# Patient Record
Sex: Female | Born: 1935 | Race: White | Hispanic: No | Marital: Married | Smoking: Never smoker
Health system: Southern US, Community
[De-identification: ages and names within clinical notes are randomized; demographics above are authoritative.]

## PROBLEM LIST (undated history)

## (undated) DIAGNOSIS — I350 Nonrheumatic aortic (valve) stenosis: Secondary | ICD-10-CM

## (undated) DIAGNOSIS — R0602 Shortness of breath: Secondary | ICD-10-CM

## (undated) DIAGNOSIS — I10 Essential (primary) hypertension: Secondary | ICD-10-CM

## (undated) DIAGNOSIS — R5381 Other malaise: Secondary | ICD-10-CM

## (undated) DIAGNOSIS — R5383 Other fatigue: Secondary | ICD-10-CM

## (undated) DIAGNOSIS — Z86010 Personal history of colonic polyps: Secondary | ICD-10-CM

## (undated) DIAGNOSIS — R7301 Impaired fasting glucose: Secondary | ICD-10-CM

## (undated) DIAGNOSIS — K635 Polyp of colon: Secondary | ICD-10-CM

## (undated) DIAGNOSIS — I2510 Atherosclerotic heart disease of native coronary artery without angina pectoris: Secondary | ICD-10-CM

## (undated) DIAGNOSIS — K219 Gastro-esophageal reflux disease without esophagitis: Secondary | ICD-10-CM

## (undated) DIAGNOSIS — R011 Cardiac murmur, unspecified: Secondary | ICD-10-CM

## (undated) DIAGNOSIS — M159 Polyosteoarthritis, unspecified: Secondary | ICD-10-CM

## (undated) DIAGNOSIS — E669 Obesity, unspecified: Secondary | ICD-10-CM

## (undated) DIAGNOSIS — E785 Hyperlipidemia, unspecified: Secondary | ICD-10-CM

## (undated) DIAGNOSIS — R112 Nausea with vomiting, unspecified: Secondary | ICD-10-CM

## (undated) DIAGNOSIS — R079 Chest pain, unspecified: Secondary | ICD-10-CM

## (undated) DIAGNOSIS — I359 Nonrheumatic aortic valve disorder, unspecified: Secondary | ICD-10-CM

## (undated) DIAGNOSIS — R0600 Dyspnea, unspecified: Secondary | ICD-10-CM

## (undated) DIAGNOSIS — Z8719 Personal history of other diseases of the digestive system: Secondary | ICD-10-CM

## (undated) DIAGNOSIS — Z9889 Other specified postprocedural states: Secondary | ICD-10-CM

## (undated) DIAGNOSIS — Z952 Presence of prosthetic heart valve: Secondary | ICD-10-CM

## (undated) HISTORY — DX: Obesity, unspecified: E66.9

## (undated) HISTORY — DX: Impaired fasting glucose: R73.01

## (undated) HISTORY — PX: KNEE SURGERY: SHX244

## (undated) HISTORY — PX: TONSILLECTOMY: SUR1361

## (undated) HISTORY — DX: Cardiac murmur, unspecified: R01.1

## (undated) HISTORY — DX: Shortness of breath: R06.02

## (undated) HISTORY — DX: Polyp of colon: K63.5

## (undated) HISTORY — DX: Dyspnea, unspecified: R06.00

## (undated) HISTORY — DX: Personal history of other diseases of the digestive system: Z87.19

## (undated) HISTORY — DX: Other specified postprocedural states: Z98.890

## (undated) HISTORY — DX: Polyosteoarthritis, unspecified: M15.9

## (undated) HISTORY — DX: Personal history of colonic polyps: Z86.010

## (undated) HISTORY — DX: Hyperlipidemia, unspecified: E78.5

## (undated) HISTORY — DX: Atherosclerotic heart disease of native coronary artery without angina pectoris: I25.10

## (undated) HISTORY — DX: Nonrheumatic aortic (valve) stenosis: I35.0

## (undated) HISTORY — DX: Nonrheumatic aortic valve disorder, unspecified: I35.9

## (undated) HISTORY — PX: FOOT SURGERY: SHX648

## (undated) HISTORY — PX: CHOLECYSTECTOMY: SHX55

## (undated) HISTORY — DX: Essential (primary) hypertension: I10

## (undated) HISTORY — DX: Other malaise: R53.81

## (undated) HISTORY — DX: Gastro-esophageal reflux disease without esophagitis: K21.9

## (undated) HISTORY — DX: Chest pain, unspecified: R07.9

## (undated) HISTORY — PX: APPENDECTOMY: SHX54

## (undated) HISTORY — DX: Other fatigue: R53.83

---

## 1997-06-28 ENCOUNTER — Encounter: Admission: RE | Admit: 1997-06-28 | Discharge: 1997-09-26 | Payer: Self-pay | Admitting: Internal Medicine

## 1997-11-21 ENCOUNTER — Other Ambulatory Visit: Admission: RE | Admit: 1997-11-21 | Discharge: 1997-11-21 | Payer: Self-pay | Admitting: Radiology

## 2000-03-17 ENCOUNTER — Other Ambulatory Visit: Admission: RE | Admit: 2000-03-17 | Discharge: 2000-03-17 | Payer: Self-pay | Admitting: Internal Medicine

## 2000-11-08 ENCOUNTER — Ambulatory Visit (HOSPITAL_COMMUNITY): Admission: RE | Admit: 2000-11-08 | Discharge: 2000-11-08 | Payer: Self-pay | Admitting: Gastroenterology

## 2000-11-08 ENCOUNTER — Encounter: Payer: Self-pay | Admitting: Gastroenterology

## 2000-11-28 ENCOUNTER — Encounter (INDEPENDENT_AMBULATORY_CARE_PROVIDER_SITE_OTHER): Payer: Self-pay | Admitting: Specialist

## 2000-11-28 ENCOUNTER — Encounter: Payer: Self-pay | Admitting: Gastroenterology

## 2000-11-28 ENCOUNTER — Ambulatory Visit (HOSPITAL_COMMUNITY): Admission: RE | Admit: 2000-11-28 | Discharge: 2000-11-28 | Payer: Self-pay | Admitting: Gastroenterology

## 2001-04-03 ENCOUNTER — Other Ambulatory Visit: Admission: RE | Admit: 2001-04-03 | Discharge: 2001-04-03 | Payer: Self-pay | Admitting: Internal Medicine

## 2003-03-28 ENCOUNTER — Encounter: Payer: Self-pay | Admitting: Internal Medicine

## 2003-04-29 ENCOUNTER — Encounter: Payer: Self-pay | Admitting: Internal Medicine

## 2003-11-19 ENCOUNTER — Ambulatory Visit: Payer: Self-pay | Admitting: Internal Medicine

## 2003-11-19 ENCOUNTER — Other Ambulatory Visit: Admission: RE | Admit: 2003-11-19 | Discharge: 2003-11-19 | Payer: Self-pay | Admitting: Internal Medicine

## 2003-11-26 ENCOUNTER — Ambulatory Visit: Payer: Self-pay | Admitting: Internal Medicine

## 2004-11-24 ENCOUNTER — Ambulatory Visit: Payer: Self-pay | Admitting: Internal Medicine

## 2004-12-07 ENCOUNTER — Ambulatory Visit: Payer: Self-pay | Admitting: Internal Medicine

## 2005-01-26 ENCOUNTER — Ambulatory Visit: Payer: Self-pay | Admitting: Internal Medicine

## 2005-02-02 ENCOUNTER — Ambulatory Visit: Payer: Self-pay | Admitting: Internal Medicine

## 2005-03-17 ENCOUNTER — Ambulatory Visit: Payer: Self-pay | Admitting: Internal Medicine

## 2005-03-23 ENCOUNTER — Ambulatory Visit: Payer: Self-pay | Admitting: Internal Medicine

## 2005-09-13 ENCOUNTER — Ambulatory Visit: Payer: Self-pay | Admitting: Internal Medicine

## 2005-09-24 ENCOUNTER — Ambulatory Visit: Payer: Self-pay | Admitting: Internal Medicine

## 2005-12-01 ENCOUNTER — Ambulatory Visit: Payer: Self-pay | Admitting: Internal Medicine

## 2006-01-17 ENCOUNTER — Ambulatory Visit: Payer: Self-pay | Admitting: Internal Medicine

## 2006-01-17 ENCOUNTER — Encounter: Payer: Self-pay | Admitting: Vascular Surgery

## 2006-01-17 ENCOUNTER — Ambulatory Visit (HOSPITAL_COMMUNITY): Admission: RE | Admit: 2006-01-17 | Discharge: 2006-01-17 | Payer: Self-pay | Admitting: Internal Medicine

## 2006-01-26 ENCOUNTER — Encounter: Admission: RE | Admit: 2006-01-26 | Discharge: 2006-01-26 | Payer: Self-pay | Admitting: Orthopaedic Surgery

## 2006-04-25 ENCOUNTER — Ambulatory Visit: Payer: Self-pay | Admitting: Internal Medicine

## 2006-04-26 ENCOUNTER — Encounter: Payer: Self-pay | Admitting: Internal Medicine

## 2006-04-26 LAB — CONVERTED CEMR LAB
Basophils Relative: 0 % (ref 0.0–1.0)
Bilirubin, Direct: 0.1 mg/dL (ref 0.0–0.3)
CO2: 27 meq/L (ref 19–32)
Creatinine, Ser: 0.8 mg/dL (ref 0.4–1.2)
Eosinophils Relative: 0.1 % (ref 0.0–5.0)
GFR calc Af Amer: 92 mL/min
Glucose, Bld: 125 mg/dL — ABNORMAL HIGH (ref 70–99)
HCT: 38.3 % (ref 36.0–46.0)
Hemoglobin: 13.3 g/dL (ref 12.0–15.0)
Lymphocytes Relative: 15 % (ref 12.0–46.0)
Monocytes Absolute: 0.4 10*3/uL (ref 0.2–0.7)
Neutro Abs: 7.1 10*3/uL (ref 1.4–7.7)
Neutrophils Relative %: 80.4 % — ABNORMAL HIGH (ref 43.0–77.0)
Potassium: 3.5 meq/L (ref 3.5–5.1)
Total Bilirubin: 0.6 mg/dL (ref 0.3–1.2)
Total Protein: 6.2 g/dL (ref 6.0–8.3)
WBC: 8.8 10*3/uL (ref 4.5–10.5)

## 2006-05-09 ENCOUNTER — Ambulatory Visit: Payer: Self-pay | Admitting: Internal Medicine

## 2006-05-11 ENCOUNTER — Ambulatory Visit: Payer: Self-pay | Admitting: Internal Medicine

## 2006-05-30 ENCOUNTER — Ambulatory Visit: Payer: Self-pay | Admitting: Internal Medicine

## 2006-07-21 ENCOUNTER — Encounter: Payer: Self-pay | Admitting: Internal Medicine

## 2006-07-21 DIAGNOSIS — Z8719 Personal history of other diseases of the digestive system: Secondary | ICD-10-CM | POA: Insufficient documentation

## 2006-07-21 DIAGNOSIS — M545 Low back pain, unspecified: Secondary | ICD-10-CM | POA: Insufficient documentation

## 2006-07-21 DIAGNOSIS — E785 Hyperlipidemia, unspecified: Secondary | ICD-10-CM | POA: Insufficient documentation

## 2006-07-21 DIAGNOSIS — K219 Gastro-esophageal reflux disease without esophagitis: Secondary | ICD-10-CM | POA: Insufficient documentation

## 2006-07-21 DIAGNOSIS — R0602 Shortness of breath: Secondary | ICD-10-CM | POA: Insufficient documentation

## 2006-07-21 DIAGNOSIS — I10 Essential (primary) hypertension: Secondary | ICD-10-CM | POA: Insufficient documentation

## 2006-07-26 ENCOUNTER — Ambulatory Visit: Payer: Self-pay | Admitting: Internal Medicine

## 2006-07-26 LAB — CONVERTED CEMR LAB
AST: 23 units/L (ref 0–37)
Albumin: 3.7 g/dL (ref 3.5–5.2)
Basophils Absolute: 0 10*3/uL (ref 0.0–0.1)
Bilirubin, Direct: 0.1 mg/dL (ref 0.0–0.3)
Chloride: 107 meq/L (ref 96–112)
Direct LDL: 164.8 mg/dL
Eosinophils Absolute: 0.2 10*3/uL (ref 0.0–0.6)
Eosinophils Relative: 2.5 % (ref 0.0–5.0)
GFR calc Af Amer: 92 mL/min
GFR calc non Af Amer: 76 mL/min
Glucose, Bld: 108 mg/dL — ABNORMAL HIGH (ref 70–99)
HCT: 39.3 % (ref 36.0–46.0)
HDL: 50 mg/dL (ref >39.0)
Hgb A1c MFr Bld: 6.1 % — ABNORMAL HIGH (ref 4.6–6.0)
Lymphocytes Relative: 37.9 % (ref 12.0–46.0)
MCHC: 34.4 g/dL (ref 30.0–36.0)
MCV: 92.7 fL (ref 78.0–100.0)
Neutro Abs: 3.2 10*3/uL (ref 1.4–7.7)
Neutrophils Relative %: 49.8 % (ref 43.0–77.0)
Potassium: 4 meq/L (ref 3.5–5.1)
RBC: 4.24 M/uL (ref 3.87–5.11)
Sodium: 143 meq/L (ref 135–145)
TSH: 1.55 microintl units/mL (ref 0.35–5.50)
Triglycerides: 198 mg/dL — ABNORMAL HIGH (ref 0–149)

## 2006-08-02 ENCOUNTER — Ambulatory Visit: Payer: Self-pay | Admitting: Internal Medicine

## 2006-08-02 ENCOUNTER — Other Ambulatory Visit: Admission: RE | Admit: 2006-08-02 | Discharge: 2006-08-02 | Payer: Self-pay | Admitting: Internal Medicine

## 2006-08-02 ENCOUNTER — Encounter: Payer: Self-pay | Admitting: Internal Medicine

## 2006-08-22 ENCOUNTER — Encounter: Payer: Self-pay | Admitting: Internal Medicine

## 2006-12-27 ENCOUNTER — Ambulatory Visit: Payer: Self-pay | Admitting: Internal Medicine

## 2006-12-27 DIAGNOSIS — R7309 Other abnormal glucose: Secondary | ICD-10-CM | POA: Insufficient documentation

## 2007-03-01 ENCOUNTER — Encounter: Payer: Self-pay | Admitting: Internal Medicine

## 2007-03-03 ENCOUNTER — Encounter: Payer: Self-pay | Admitting: Internal Medicine

## 2007-03-08 ENCOUNTER — Encounter: Payer: Self-pay | Admitting: Internal Medicine

## 2007-03-23 ENCOUNTER — Ambulatory Visit: Payer: Self-pay | Admitting: Internal Medicine

## 2007-03-23 DIAGNOSIS — R7301 Impaired fasting glucose: Secondary | ICD-10-CM | POA: Insufficient documentation

## 2007-03-26 LAB — CONVERTED CEMR LAB
Cholesterol: 278 mg/dL (ref 0–200)
HDL: 48.6 mg/dL (ref >39.0)
TSH: 1.68 microintl units/mL (ref 0.35–5.50)

## 2007-03-28 ENCOUNTER — Ambulatory Visit: Payer: Self-pay | Admitting: Internal Medicine

## 2007-03-28 LAB — CONVERTED CEMR LAB
Cholesterol, target level: 200 mg/dL
HDL goal, serum: 40 mg/dL
LDL Goal: 130 mg/dL

## 2007-04-17 ENCOUNTER — Encounter: Payer: Self-pay | Admitting: Internal Medicine

## 2007-05-03 ENCOUNTER — Encounter: Payer: Self-pay | Admitting: Internal Medicine

## 2007-05-24 ENCOUNTER — Ambulatory Visit: Payer: Self-pay | Admitting: Internal Medicine

## 2007-05-24 LAB — CONVERTED CEMR LAB
ALT: 28 units/L (ref 0–35)
Cholesterol: 198 mg/dL (ref 0–200)
LDL Cholesterol: 122 mg/dL — ABNORMAL HIGH (ref 0–99)
Triglycerides: 129 mg/dL (ref 0–149)

## 2007-06-05 ENCOUNTER — Ambulatory Visit: Payer: Self-pay | Admitting: Internal Medicine

## 2007-06-05 DIAGNOSIS — M159 Polyosteoarthritis, unspecified: Secondary | ICD-10-CM | POA: Insufficient documentation

## 2007-06-05 LAB — CONVERTED CEMR LAB: Blood Glucose, Fingerstick: 102

## 2007-06-22 ENCOUNTER — Encounter: Payer: Self-pay | Admitting: Internal Medicine

## 2007-07-25 ENCOUNTER — Ambulatory Visit: Payer: Self-pay | Admitting: Family Medicine

## 2007-07-25 LAB — CONVERTED CEMR LAB
Ketones, urine, test strip: NEGATIVE
Urobilinogen, UA: 0.2
pH: 7

## 2007-08-24 ENCOUNTER — Encounter: Payer: Self-pay | Admitting: Internal Medicine

## 2007-09-20 ENCOUNTER — Ambulatory Visit: Payer: Self-pay | Admitting: Internal Medicine

## 2007-09-20 LAB — CONVERTED CEMR LAB: Cortisol, Plasma: 6.6 ug/dL

## 2007-10-03 ENCOUNTER — Ambulatory Visit: Payer: Self-pay | Admitting: Internal Medicine

## 2007-10-03 LAB — CONVERTED CEMR LAB: Blood Glucose, Fingerstick: 137

## 2007-10-04 ENCOUNTER — Encounter: Payer: Self-pay | Admitting: Internal Medicine

## 2007-10-20 ENCOUNTER — Encounter: Payer: Self-pay | Admitting: Internal Medicine

## 2007-10-20 ENCOUNTER — Encounter: Admission: RE | Admit: 2007-10-20 | Discharge: 2007-10-20 | Payer: Self-pay | Admitting: Internal Medicine

## 2007-11-01 ENCOUNTER — Ambulatory Visit: Payer: Self-pay | Admitting: Internal Medicine

## 2007-11-22 ENCOUNTER — Telehealth: Payer: Self-pay | Admitting: Internal Medicine

## 2008-02-21 ENCOUNTER — Ambulatory Visit: Payer: Self-pay | Admitting: Internal Medicine

## 2008-02-28 ENCOUNTER — Telehealth: Payer: Self-pay | Admitting: *Deleted

## 2008-03-26 ENCOUNTER — Ambulatory Visit: Payer: Self-pay | Admitting: Internal Medicine

## 2008-03-26 DIAGNOSIS — M810 Age-related osteoporosis without current pathological fracture: Secondary | ICD-10-CM | POA: Insufficient documentation

## 2008-03-26 LAB — CONVERTED CEMR LAB
BUN: 16 mg/dL (ref 6–23)
CO2: 28 meq/L (ref 19–32)
Calcium: 9.2 mg/dL (ref 8.4–10.5)
Chloride: 106 meq/L (ref 96–112)
Creatinine, Ser: 0.6 mg/dL (ref 0.4–1.2)
GFR calc non Af Amer: 105 mL/min

## 2008-03-27 ENCOUNTER — Encounter: Admission: RE | Admit: 2008-03-27 | Discharge: 2008-03-27 | Payer: Self-pay | Admitting: Gastroenterology

## 2008-04-02 ENCOUNTER — Ambulatory Visit: Payer: Self-pay | Admitting: Internal Medicine

## 2008-04-09 ENCOUNTER — Encounter: Payer: Self-pay | Admitting: Internal Medicine

## 2008-04-15 ENCOUNTER — Ambulatory Visit: Payer: Self-pay | Admitting: Internal Medicine

## 2008-04-15 DIAGNOSIS — R011 Cardiac murmur, unspecified: Secondary | ICD-10-CM | POA: Insufficient documentation

## 2008-04-15 DIAGNOSIS — R519 Headache, unspecified: Secondary | ICD-10-CM | POA: Insufficient documentation

## 2008-04-15 DIAGNOSIS — R51 Headache: Secondary | ICD-10-CM | POA: Insufficient documentation

## 2008-04-25 ENCOUNTER — Encounter: Payer: Self-pay | Admitting: Internal Medicine

## 2008-04-25 ENCOUNTER — Ambulatory Visit: Payer: Self-pay

## 2008-05-07 ENCOUNTER — Ambulatory Visit: Payer: Self-pay | Admitting: Internal Medicine

## 2008-06-07 ENCOUNTER — Telehealth: Payer: Self-pay | Admitting: Internal Medicine

## 2008-06-18 ENCOUNTER — Ambulatory Visit: Payer: Self-pay | Admitting: Cardiology

## 2008-06-18 DIAGNOSIS — I359 Nonrheumatic aortic valve disorder, unspecified: Secondary | ICD-10-CM | POA: Insufficient documentation

## 2008-07-02 ENCOUNTER — Telehealth: Payer: Self-pay | Admitting: *Deleted

## 2008-08-29 ENCOUNTER — Encounter: Payer: Self-pay | Admitting: Internal Medicine

## 2008-09-24 ENCOUNTER — Telehealth: Payer: Self-pay | Admitting: Cardiology

## 2008-10-11 ENCOUNTER — Telehealth: Payer: Self-pay | Admitting: *Deleted

## 2008-10-30 ENCOUNTER — Telehealth: Payer: Self-pay | Admitting: *Deleted

## 2008-11-01 ENCOUNTER — Ambulatory Visit: Payer: Self-pay | Admitting: Internal Medicine

## 2008-11-01 LAB — CONVERTED CEMR LAB
ALT: 26 units/L (ref 0–35)
AST: 21 units/L (ref 0–37)
Albumin: 3.8 g/dL (ref 3.5–5.2)
BUN: 16 mg/dL (ref 6–23)
Chloride: 103 meq/L (ref 96–112)
Cholesterol: 220 mg/dL — ABNORMAL HIGH (ref 0–200)
Creatinine, Ser: 0.9 mg/dL (ref 0.4–1.2)
Direct LDL: 153 mg/dL
Glucose, Bld: 100 mg/dL — ABNORMAL HIGH (ref 70–99)
Potassium: 4.2 meq/L (ref 3.5–5.1)
Total Protein: 6.8 g/dL (ref 6.0–8.3)
Triglycerides: 106 mg/dL (ref 0.0–149.0)

## 2008-11-04 ENCOUNTER — Telehealth: Payer: Self-pay | Admitting: Cardiology

## 2008-11-08 ENCOUNTER — Ambulatory Visit: Payer: Self-pay | Admitting: Internal Medicine

## 2009-03-20 ENCOUNTER — Ambulatory Visit: Payer: Self-pay | Admitting: Cardiology

## 2009-03-21 ENCOUNTER — Ambulatory Visit: Payer: Self-pay | Admitting: Cardiology

## 2009-03-24 LAB — CONVERTED CEMR LAB
Chloride: 100 meq/L (ref 96–112)
Creatinine, Ser: 0.9 mg/dL (ref 0.4–1.2)
Potassium: 4.4 meq/L (ref 3.5–5.1)
Sodium: 138 meq/L (ref 135–145)

## 2009-04-08 ENCOUNTER — Telehealth (INDEPENDENT_AMBULATORY_CARE_PROVIDER_SITE_OTHER): Payer: Self-pay | Admitting: *Deleted

## 2009-04-09 ENCOUNTER — Encounter (INDEPENDENT_AMBULATORY_CARE_PROVIDER_SITE_OTHER): Payer: Self-pay | Admitting: *Deleted

## 2009-04-10 ENCOUNTER — Encounter: Payer: Self-pay | Admitting: Cardiology

## 2009-04-10 ENCOUNTER — Ambulatory Visit: Payer: Self-pay | Admitting: Cardiology

## 2009-04-10 ENCOUNTER — Ambulatory Visit (HOSPITAL_COMMUNITY): Admission: RE | Admit: 2009-04-10 | Discharge: 2009-04-10 | Payer: Self-pay | Admitting: Cardiology

## 2009-04-10 ENCOUNTER — Ambulatory Visit: Payer: Self-pay

## 2009-04-17 ENCOUNTER — Ambulatory Visit: Payer: Self-pay | Admitting: Cardiology

## 2009-04-17 LAB — CONVERTED CEMR LAB
CO2: 31 meq/L (ref 19–32)
Chloride: 100 meq/L (ref 96–112)
GFR calc non Af Amer: 75.02 mL/min (ref >60)
Glucose, Bld: 117 mg/dL — ABNORMAL HIGH (ref 70–99)
Potassium: 4.5 meq/L (ref 3.5–5.1)
Sodium: 138 meq/L (ref 135–145)

## 2009-06-11 ENCOUNTER — Ambulatory Visit: Payer: Self-pay | Admitting: Internal Medicine

## 2009-06-11 LAB — CONVERTED CEMR LAB
Direct LDL: 190.2 mg/dL
HDL: 55 mg/dL (ref >39.00)
Total CHOL/HDL Ratio: 5
VLDL: 38.6 mg/dL (ref 0.0–40.0)

## 2009-06-19 ENCOUNTER — Ambulatory Visit: Payer: Self-pay | Admitting: Internal Medicine

## 2009-08-13 ENCOUNTER — Ambulatory Visit: Payer: Self-pay | Admitting: Internal Medicine

## 2009-08-13 LAB — CONVERTED CEMR LAB
AST: 21 units/L (ref 0–37)
Albumin: 3.8 g/dL (ref 3.5–5.2)
Alkaline Phosphatase: 66 units/L (ref 39–117)
GFR calc non Af Amer: 76.04 mL/min (ref >60)
Glucose, Bld: 103 mg/dL — ABNORMAL HIGH (ref 70–99)
Potassium: 4.4 meq/L (ref 3.5–5.1)
Sodium: 136 meq/L (ref 135–145)
Total Bilirubin: 0.8 mg/dL (ref 0.3–1.2)
VLDL: 36.2 mg/dL (ref 0.0–40.0)

## 2009-08-21 ENCOUNTER — Ambulatory Visit: Payer: Self-pay | Admitting: Internal Medicine

## 2009-08-21 DIAGNOSIS — R059 Cough, unspecified: Secondary | ICD-10-CM | POA: Insufficient documentation

## 2009-08-21 DIAGNOSIS — R05 Cough: Secondary | ICD-10-CM

## 2009-09-01 ENCOUNTER — Encounter: Payer: Self-pay | Admitting: Internal Medicine

## 2009-09-10 ENCOUNTER — Telehealth: Payer: Self-pay | Admitting: *Deleted

## 2009-09-15 ENCOUNTER — Encounter: Payer: Self-pay | Admitting: Internal Medicine

## 2009-10-22 ENCOUNTER — Encounter: Payer: Self-pay | Admitting: Internal Medicine

## 2009-10-29 ENCOUNTER — Encounter: Payer: Self-pay | Admitting: Internal Medicine

## 2009-10-29 ENCOUNTER — Encounter: Payer: Self-pay | Admitting: *Deleted

## 2009-12-23 ENCOUNTER — Ambulatory Visit: Payer: Self-pay | Admitting: Cardiology

## 2009-12-23 ENCOUNTER — Encounter: Payer: Self-pay | Admitting: Cardiology

## 2009-12-31 ENCOUNTER — Ambulatory Visit: Payer: Self-pay | Admitting: Internal Medicine

## 2009-12-31 DIAGNOSIS — R5381 Other malaise: Secondary | ICD-10-CM

## 2009-12-31 DIAGNOSIS — M79609 Pain in unspecified limb: Secondary | ICD-10-CM | POA: Insufficient documentation

## 2009-12-31 DIAGNOSIS — R5383 Other fatigue: Secondary | ICD-10-CM | POA: Insufficient documentation

## 2010-01-05 LAB — CONVERTED CEMR LAB
BUN: 19 mg/dL (ref 6–23)
Basophils Absolute: 0 10*3/uL (ref 0.0–0.1)
CRP, High Sensitivity: 1.04 (ref 0.00–5.00)
Chloride: 102 meq/L (ref 96–112)
Ferritin: 55.7 ng/mL (ref 10.0–291.0)
Glucose, Bld: 92 mg/dL (ref 70–99)
HCT: 38.6 % (ref 36.0–46.0)
Lymphs Abs: 2.4 10*3/uL (ref 0.7–4.0)
MCV: 96.5 fL (ref 78.0–100.0)
Monocytes Absolute: 0.6 10*3/uL (ref 0.1–1.0)
Platelets: 265 10*3/uL (ref 150.0–400.0)
Potassium: 4.2 meq/L (ref 3.5–5.1)
RDW: 13.6 % (ref 11.5–14.6)
Saturation Ratios: 29.5 % (ref 20.0–50.0)
Vitamin B-12: 334 pg/mL (ref 211–911)

## 2010-02-15 LAB — CONVERTED CEMR LAB: Pro B Natriuretic peptide (BNP): 25 pg/mL (ref 0.0–100.0)

## 2010-02-19 NOTE — Assessment & Plan Note (Signed)
Summary: per check out/saf   Primary Provider:  [name] MD  CC:  ROV;Chronic SOB/DOE.  History of Present Illness: Pleasant female I saw in March of 2011 for aortic stenosis. She did have a Myoview performed on April 29, 2003 that showed an ejection fraction of 67%. There was breast attenuation but no ischemia. Echocardiogram in March of 2011 showed normal LV function, mild aortic stenosis with a mean gradient of 13 mmHg, mild left atrial enlargement and mildly elevated pulmonary pressures. A BNP was normal. D-dimer was mildly elevated but chest CT showed no pulmonary embolus. Placed on beta blocker previously secondary to PVCs. Since I last saw her in March of 2011, he continues to have dyspnea on exertion relieved with rest. There is no chest pain. No orthopnea or PND but there is occasional mild pedal edema.  Current Medications (verified): 1)  Zegerid 40-1100 Mg Caps (Omeprazole-Sodium Bicarbonate) .... Take 1 Capsule By Mouth Once A Day 2)  Aspirin 81 Mg Tbec (Aspirin) .... Take One Tablet By Mouth Daily 3)  Multivitamins   Tabs (Multiple Vitamin) .... Tab By Mouth Once Daily 4)  Metoprolol Succinate 25 Mg Xr24h-Tab (Metoprolol Succinate) .... Take One Tablet By Mouth Daily 5)  Losartan Potassium-Hctz 50-12.5 Mg Tabs (Losartan Potassium-Hctz) .Marland Kitchen.. 1 By Mouth Once Daily  For High Blood Pressure 6)  Crestor 10 Mg Tabs (Rosuvastatin Calcium) .... Take  Daily As Directed 1/2 To 1 As Tolerated 7)  Fluticasone Propionate 50 Mcg/act Susp (Fluticasone Propionate) .... 2 Sprays Each Nostril  Each Day As Needed  Allergies: 1)  ! Lisinopril (Lisinopril) 2)  * Statins  Past History:  Past Medical History: Reviewed history from 08/21/2009 and no changes required. HYPERTENSION (ICD-401.9) HYPERLIPIDEMIA (ICD-272.4) UNSPECIFIED OSTEOPOROSIS (ICD-733.00) GEN OSTEOARTHROSIS INVOLVING MULTIPLE SITES (ICD-715.09) IMPAIRED FASTING GLUCOSE (ICD-790.21) OBESITY (ICD-278.00) GERD  (ICD-530.81) DIVERTICULITIS, HX OF (ICD-V12.79) Aortic Stenosis  mild on echo 3 2011   Past Surgical History: Reviewed history from 10/03/2007 and no changes required. Appendectomy Cholecystectomy Dilatation EGD Rt Knee  and foot Surgery  Lf Knee Tear Tonsillectomy  Social History: Reviewed history from 11/08/2008 and no changes required. Married Former Smoker Alcohol use-no Drug use-no Regular exercise-no  except swim 2 x per week no ets  hh of 3 ,   pet dog.  paraplegic  49 years   taken care of 73 yo gc sometimes   Review of Systems       Problems with knee arthralgias and feet pain but no fevers or chills, productive cough, hemoptysis, dysphasia, odynophagia, melena, hematochezia, dysuria, hematuria, rash, seizure activity, orthopnea, PND, claudication. Remaining systems are negative.   Vital Signs:  Patient profile:   75 year old female Menstrual status:  postmenopausal Height:      63 inches Weight:      248 pounds BMI:     44.09 BP sitting:   134 / 54  (left arm) Cuff size:   regular  Vitals Entered By: Stanton Kidney, EMT-P (December 23, 2009 10:29 AM)  Physical Exam  General:  Well-developed obese in no acute distress.  Skin is warm and dry.  HEENT is normal.  Neck is supple. No thyromegaly.  Chest is clear to auscultation with normal expansion.  Cardiovascular exam is regular rate and rhythm. 2/6 systolic murmur left sternal border. S2 is not diminished. Abdominal exam nontender or distended. No masses palpated. Extremities show trace edema. neuro grossly intact    EKG  Procedure date:  12/23/2009  Findings:  Sinus rhythm with PACs.  Impression & Recommendations:  Problem # 1:  AORTIC VALVE DISORDERS (ICD-424.1) Plan repeat echocardiogram she returns in one year. Her updated medication list for this problem includes:    Metoprolol Succinate 25 Mg Xr24h-tab (Metoprolol succinate) .Marland Kitchen... Take one tablet by mouth daily    Losartan  Potassium-hctz 50-12.5 Mg Tabs (Losartan potassium-hctz) .Marland Kitchen... 1 by mouth once daily  for high blood pressure  Problem # 2:  HYPERTENSION (ICD-401.9) Blood pressure controlled on present medications. Will continue. Her updated medication list for this problem includes:    Aspirin 81 Mg Tbec (Aspirin) .Marland Kitchen... Take one tablet by mouth daily    Metoprolol Succinate 25 Mg Xr24h-tab (Metoprolol succinate) .Marland Kitchen... Take one tablet by mouth daily    Losartan Potassium-hctz 50-12.5 Mg Tabs (Losartan potassium-hctz) .Marland Kitchen... 1 by mouth once daily  for high blood pressure  Orders: EKG w/ Interpretation (93000)  Problem # 3:  HYPERLIPIDEMIA (ICD-272.4) Continue statin. Lipids and liver monitored by primary care. Her updated medication list for this problem includes:    Crestor 10 Mg Tabs (Rosuvastatin calcium) .Marland Kitchen... Take  daily as directed 1/2 to 1 as tolerated  Problem # 4:  SOB (ICD-786.05) Most likely multifactorial. Her aortic stenosis is not to the point that it would be causing this degree of dyspnea. There may be a component of obesity hypoventilation syndrome. Her updated medication list for this problem includes:    Aspirin 81 Mg Tbec (Aspirin) .Marland Kitchen... Take one tablet by mouth daily    Metoprolol Succinate 25 Mg Xr24h-tab (Metoprolol succinate) .Marland Kitchen... Take one tablet by mouth daily    Losartan Potassium-hctz 50-12.5 Mg Tabs (Losartan potassium-hctz) .Marland Kitchen... 1 by mouth once daily  for high blood pressure  Problem # 5:  GERD (ICD-530.81)  Her updated medication list for this problem includes:    Zegerid 40-1100 Mg Caps (Omeprazole-sodium bicarbonate) .Marland Kitchen... Take 1 capsule by mouth once a day  Patient Instructions: 1)  Your physician recommends that you schedule a follow-up appointment in: YEAR WITH DR CRENSHAW 2)  Your physician recommends that you continue on your current medications as directed. Please refer to the Current Medication list given to you today.

## 2010-02-19 NOTE — Progress Notes (Signed)
Summary: Referral to ENT  Phone Note Call from Patient Call back at Home Phone [phone] Call back at [phone]   Caller: Patient Summary of Call: Pt called saying that she still has the cough and would like a referral to ENT. Pt is going out of Sept 14-16 and Sept 21-22. Initial call taken by: Romualdo Bolk, CMA Duncan Dull),  September 10, 2009 9:06 AM  Follow-up for Phone Call        Per Dr. Fabian Sharp- Okay to refer to ENT Order sent to [facility]. Follow-up by: Romualdo Bolk, CMA Duncan Dull),  September 10, 2009 5:23 PM

## 2010-02-19 NOTE — Letter (Signed)
Summary: [facility]  [facility]   Imported By: Maryln Gottron 10/29/2009 13:29:06  _____________________________________________________________________  External Attachment:    Type:   Image     Comment:   External Document

## 2010-02-19 NOTE — Assessment & Plan Note (Signed)
Summary: fup labs//ccm   Vital Signs:  Patient profile:   75 year old female Menstrual status:  postmenopausal Weight:      244 pounds Pulse rate:   78 / minute BP sitting:   110 / 70  (right arm) Cuff size:   large  Vitals Entered By: Romualdo Bolk, CMA (AAMA) (June 19, 2009 9:23 AM) CC: Follow-up visit on labs   History of Present Illness: Anna Gamble  comes in today  for follow up of multiple medical problems .  LIPIDs ran out of crestor   months ago  and    every other day.    no se at that time.    BP  ok  but  has dry cough now     for the past few months  was switched to ace by Dr Jens Som .  No paper record but memeory serves that she was switched from ace in past  for cough .  BG :   No change .  Left ankle popped a ligament and still bothers her  no surgery rec  harder to exercise .  coule days of  low abd discomfort after back spasm that is gone .   no fever  . worried about being from  diverticulitis that she has had before. Called Dr Aurora Mask office and he is out. No nausea or vomiting.     Preventive Screening-Counseling & Management  Alcohol-Tobacco     Alcohol drinks/day: 0     Smoking Status: quit     Year Quit: 40 years ago  Caffeine-Diet-Exercise     Caffeine use/day: 2     Does Patient Exercise: yes     Type of exercise: swim     Exercise (avg: min/session): 30-60     Times/week: 3  Current Medications (verified): 1)  Zegerid 40-1100 Mg Caps (Omeprazole-Sodium Bicarbonate) .... Take 1 Capsule By Mouth Once A Day 2)  Lisinopril-Hydrochlorothiazide 20-12.5 Mg Tabs (Lisinopril-Hydrochlorothiazide) .... One Tablet By Mouth Once Daily 3)  Crestor 5 Mg Tabs (Rosuvastatin Calcium) .Marland Kitchen.. 1 Tab Every Other Day 4)  Aspirin 81 Mg Tbec (Aspirin) .... Take One Tablet By Mouth Daily 5)  Multivitamins   Tabs (Multiple Vitamin) .... Tab By Mouth Once Daily 6)  Metoprolol Succinate 25 Mg Xr24h-Tab (Metoprolol Succinate) .... Take One Tablet By Mouth  Daily  Allergies (verified): 1)  * Lsinopril 2)  * Statins  Past History:  Past medical, surgical, family and social histories (including risk factors) reviewed, and no changes noted (except as noted below).  Past Medical History: Reviewed history from 03/20/2009 and no changes required. HYPERTENSION (ICD-401.9) HYPERLIPIDEMIA (ICD-272.4) UNSPECIFIED OSTEOPOROSIS (ICD-733.00) GEN OSTEOARTHROSIS INVOLVING MULTIPLE SITES (ICD-715.09) IMPAIRED FASTING GLUCOSE (ICD-790.21) OBESITY (ICD-278.00) GERD (ICD-530.81) DIVERTICULITIS, HX OF (ICD-V12.79) Aortic Stenosis  Past Surgical History: Reviewed history from 10/03/2007 and no changes required. Appendectomy Cholecystectomy Dilatation EGD Rt Knee  and foot Surgery  Lf Knee Tear Tonsillectomy  Past History:  Care Management: Orthopedics: Dr. Ernesto Rutherford Gastroenterology: Dr. Kinnie Scales Cards:   Jens Som  Family History: Reviewed history from 06/18/2008 and no changes required. Family History of CAD Female 1st degree relative  Family History of CAD Female 1st degree relative 29s  Family History High cholesterol Family History Hypertension  Brother died of MI and cva at age 70  Brother died of CVA Mother and father died of MI in 67's Son is paraplegic    Social History: Reviewed history from 11/08/2008 and no changes required. Married Former Smoker Alcohol  use-no Drug use-no Regular exercise-no  except swim 2 x per week no ets  hh of 3 ,   pet dog.  paraplegic  49 years   taken care of 73 yo gc sometimes   Review of Systems       The patient complains of dyspnea on exertion and prolonged cough.  The patient denies anorexia, fever, weight loss, weight gain, chest pain, syncope, melena, hematochezia, severe indigestion/heartburn, abnormal bleeding, enlarged lymph nodes, and angioedema.         muscle spasms  at times back and feet in     Physical Exam  General:  Well-developed,well-nourished,in no acute distress;  alert,appropriate and cooperative throughout examination Head:  normocephalic and atraumatic.   Eyes:  vision grossly intact, pupils equal, and pupils round.   Neck:  No deformities, masses, or tenderness noted. Lungs:  Normal respiratory effort, chest expands symmetrically. Lungs are clear to auscultation, no crackles or wheezes.no dullness.   Heart:  Normal rate and regular rhythm. S1 and S2 normal without gallop,, click, rub or other extra sounds.  2/  6systolic  murmur upper chest not to neck   diastole clear?  Abdomen:  Bowel sounds positive,abdomen soft and non-tender without masses, organomegaly or  s noted. minimal tenderness llq  no gor r  Msk:  left andkle some swelling no redness or warmth Pulses:  pulses intact without delay   Extremities:  1+ left pedal edema and trace right pedal edema.   Neurologic:  alert & oriented X3.  antalgic walks with cane  Skin:  turgor normal, color normal, no ecchymoses, and no petechiae.   Psych:  Oriented X3, normally interactive, good eye contact, not anxious appearing, and not depressed appearing.     Impression & Recommendations:  Problem # 1:  HYPERLIPIDEMIA (ICD-272.4) Assessment Deteriorated off medications   hx of dose related se and  se in past ? lipitor   no paper record available today  to be able to review  The following medications were removed from the medication list:    Crestor 5 Mg Tabs (Rosuvastatin calcium) .Marland Kitchen... 1 tab every other day- d/c on jan 2011 Her updated medication list for this problem includes:    Crestor 10 Mg Tabs (Rosuvastatin calcium) .Marland Kitchen... Take 1 by mouth every other day or as directed  Problem # 2:  HYPERTENSION (ICD-401.9)  Her updated medication list for this problem includes:    Lisinopril-hydrochlorothiazide 20-12.5 Mg Tabs (Lisinopril-hydrochlorothiazide) ..... One tablet by mouth once daily    Metoprolol Succinate 25 Mg Xr24h-tab (Metoprolol succinate) .Marland Kitchen... Take one tablet by mouth daily    Losartan  Potassium-hctz 50-12.5 Mg Tabs (Losartan potassium-hctz) .Marland Kitchen... 1 by mouth once daily  for high blood pressure  Orders: Prescription Created Electronically [phone])  Problem # 3:  ABDOMINAL PAIN, LEFT LOWER QUADRANT (ICD-789.04) mild transient but has  had diverticultis   in past  and can rs if symptom return or are persistent.   Problem # 4:  ADVERSE REACTION TO MEDICATION (ICD-995.29)  cough with ace   Orders: Prescription Created Electronically [id number])  Problem # 5:  IMPAIRED FASTING GLUCOSE (ICD-790.21)  no change hg a1c is 6.0   Labs Reviewed: Creat: 0.8 (04/17/2009)     Complete Medication List: 1)  Zegerid 40-1100 Mg Caps (Omeprazole-sodium bicarbonate) .... Take 1 capsule by mouth once a day 2)  Lisinopril-hydrochlorothiazide 20-12.5 Mg Tabs (Lisinopril-hydrochlorothiazide) .... One tablet by mouth once daily 3)  Aspirin 81 Mg Tbec (Aspirin) .... Take one tablet by  mouth daily 4)  Multivitamins Tabs (Multiple vitamin) .... Tab by mouth once daily 5)  Metoprolol Succinate 25 Mg Xr24h-tab (Metoprolol succinate) .... Take one tablet by mouth daily 6)  Losartan Potassium-hctz 50-12.5 Mg Tabs (Losartan potassium-hctz) .Marland Kitchen.. 1 by mouth once daily  for high blood pressure 7)  Crestor 10 Mg Tabs (Rosuvastatin calcium) .... Take 1 by mouth every other day or as directed 8)  Ciprofloxacin Hcl 500 Mg Tabs (Ciprofloxacin hcl) .Marland Kitchen.. 1 by mouth two times a day 9)  Metronidazole 500 Mg Tabs (Metronidazole) .Marland Kitchen.. 1 by mouth three times a day  Patient Instructions: 1)  change  BP med to see if cough goes away. 2)  ( stop the lisinopril) 3)  restart the crestor and can try 10 every other day  . 4)  Please return for a FASTING Lipid Profile, lfts, bmp  in 8 weeks .  5)  then return office visit . 6)  If  abd pain is persistent or  progressive  go ahead and take antibiotic for possible diverticultis .  follow up with Dr Kinnie Scales if not getting better.  or seek emergent care if fever worsening  etc.  Prescriptions: METRONIDAZOLE 500 MG TABS (METRONIDAZOLE) 1 by mouth three times a day  #30 x 0   Entered and Authorized by:   [name] MD   Signed by:   [name] MD on 06/19/2009   Method used:   Print then Give to Patient   RxID:   [id number] CIPROFLOXACIN HCL 500 MG TABS (CIPROFLOXACIN HCL) 1 by mouth two times a day  #20 x 0   Entered and Authorized by:   [name] MD   Signed by:   [name] MD on 06/19/2009   Method used:   Print then Give to Patient   RxID:   [id number] LOSARTAN POTASSIUM-HCTZ 50-12.5 MG TABS (LOSARTAN POTASSIUM-HCTZ) 1 by mouth once daily  for high blood pressure  #30 x 2   Entered and Authorized by:   [name] MD   Signed by:   [name] MD on 06/19/2009   Method used:   Electronically to        [facility]. [address] [phone]* (retail)       [address]       [place], [place]  [address]       Ph: [phone] or [phone]       Fax: [phone]   RxID:   [id number]

## 2010-02-19 NOTE — Consult Note (Signed)
Summary: [facility]  [facility]   Imported By: Sherian Rein 09/24/2009 07:40:31  _____________________________________________________________________  External Attachment:    Type:   Image     Comment:   External Document

## 2010-02-19 NOTE — Miscellaneous (Signed)
Summary: Flu Shot/[facility]  Flu Shot/[facility]   Imported By: Maryln Gottron 11/04/2009 13:04:30  _____________________________________________________________________  External Attachment:    Type:   Image     Comment:   External Document

## 2010-02-19 NOTE — Assessment & Plan Note (Signed)
Summary: PT WILL COME IN FASTING/NJR   Vital Signs:  Patient profile:   75 year old female Menstrual status:  postmenopausal Height:      62.25 inches Weight:      247 pounds Pulse rate:   78 / minute BP sitting:   120 / 80  (right arm) Cuff size:   large  Vitals Entered By: Romualdo Bolk, CMA (AAMA) (December 31, 2009 10:23 AM) CC: Annual Visit for Disease Management- Pt is fasting for labs- Discuss doing a pap   History of Present Illness: Anna Gamble comes in today  for Here for Medicare AWV:  and disase management   1.   Risk factors based on Past M, S, F history:  see below  2.   Physical Activities:  walking  driving  swim exercise but decrease otherwise  because of foot left predicament 3.   Depression/mood:   stable  4.   Hearing:  ok  5.   ADL's:  does all  self care  6.   Fall Risk:  fell 6 months     a got tripped  none recently   related to ortho issues  7.   Home Safety:  reviewed  8.   Height, weight, &visual acuity: Has  glasses    sees [employer]  yearly  9.   Counseling:  10.   Labs ordered based on risk factors:  11.           Referral Coordination 12.           Care Plan    End of life   Has   POA and discussed with family 11.            Cognitive Assessment  Pt is A&Ox3,affect,speech,memory,attention,&motor skills appear intact.  LIPIDSNO se of meds noted  HT:  controlled  no se of meds noted   insurance notified  increase cost of metopr xr vs IR   Aortic stenosis  :Felt no symptomatic  LEFT foot : surgery  reconstruct recommended may need a second opinons  GERD: no change  sometime rare choking at night .  Fatigue werose over the last months ? cause .  Has seen cards doesnt think heart is the problem  Poss obesity etc.     Obesity  doing weight watchers   lost a few pounds.  Left foot pain:   needs extensive reconstruction    cautious as would need 6 months rehab.  considering 2nd opinion.   Preventive Care Screening  Colonoscopy:    Date:   01/19/2007    Results:  normal   Pap Smear:    Date:  01/18/2006    Results:  normal   Prior Values:    Last Tetanus Booster:  Historical (03/18/1996)    Last Flu Shot:  Fluvax 3+ (10/29/2009)   Preventive Screening-Counseling & Management  Alcohol-Tobacco     Alcohol drinks/day: 0     Smoking Status: quit     Year Quit: 40 years ago  Caffeine-Diet-Exercise     Caffeine use/day: 2     Does Patient Exercise: yes     Type of exercise: swim     Exercise (avg: min/session): 30-60     Times/week: 3  Hep-HIV-STD-Contraception     Dental Visit-last 6 months yes     Sun Exposure-Excessive: no  Safety-Violence-Falls     Seat Belt Use: yes     Firearms in the Home: firearms in the home  Firearm Counseling: not indicated; uses recommended firearm safety measures     Smoke Detectors: yes     Fall Risk: disc   Current Medications (verified): 1)  Zegerid 40-1100 Mg Caps (Omeprazole-Sodium Bicarbonate) .... Take 1 Capsule By Mouth Once A Day 2)  Aspirin 81 Mg Tbec (Aspirin) .... Take One Tablet By Mouth Daily 3)  Multivitamins   Tabs (Multiple Vitamin) .... Tab By Mouth Once Daily 4)  Metoprolol Succinate 25 Mg Xr24h-Tab (Metoprolol Succinate) .... Take One Tablet By Mouth Daily 5)  Losartan Potassium-Hctz 50-12.5 Mg Tabs (Losartan Potassium-Hctz) .Marland Kitchen.. 1 By Mouth Once Daily  For High Blood Pressure 6)  Crestor 10 Mg Tabs (Rosuvastatin Calcium) .... Take  Daily As Directed 1/2 To 1 As Tolerated 7)  Fluticasone Propionate 50 Mcg/act Susp (Fluticasone Propionate) .... 2 Sprays Each Nostril  Each Day As Needed  Allergies (verified): 1)  ! Lisinopril (Lisinopril) 2)  * Statins  Past History:  Past medical, surgical, family and social histories (including risk factors) reviewed, and no changes noted (except as noted below).  Past Medical History: HYPERTENSION (ICD-401.9) HYPERLIPIDEMIA (ICD-272.4) UNSPECIFIED OSTEOPOROSIS (ICD-733.00) GEN OSTEOARTHROSIS INVOLVING MULTIPLE  SITES (ICD-715.09) IMPAIRED FASTING GLUCOSE (ICD-790.21) OBESITY (ICD-278.00) GERD (ICD-530.81) DIVERTICULITIS, HX OF (ICD-V12.79) Aortic Stenosis  mild on echo 3 2011  Colon polyps   Past Surgical History: Reviewed history from 10/03/2007 and no changes required. Appendectomy Cholecystectomy Dilatation EGD Rt Knee  and foot Surgery  Lf Knee Tear Tonsillectomy  Past History:  Care Management: Orthopedics: Dr. Ernesto Rutherford Gastroenterology: Dr. Kinnie Scales Cards:   Jens Som  Family History: Reviewed history from 06/18/2008 and no changes required. Family History of CAD Female 1st degree relative  Family History of CAD Female 1st degree relative 53s  Family History High cholesterol Family History Hypertension  Brother died of MI and cva at age 46  Brother died of CVA Mother and father died of MI in 58's Son is paraplegic Son has sleep apnea  hysband had surgery for OSA ans snoring.    Social History: Reviewed history from 11/08/2008 and no changes required. Married Former Smoker Alcohol use-no Drug use-no Regular exercise-no  except swim 2 x per weekcat no ets  hh of 3 ,   pet dog.  paraplegic  49 years   taken care of 73 yo gc sometimes Dental Care w/in 6 mos.:  yes Seat Belt Use:  yes Sun Exposure-Excessive:  no Fall Risk:  disc   Review of Systems       No recent spotting   had gyne eval and bx in the past .  no cancer .  no hx of abnormal paps   , hearling vision stable no bleeding  . see hpi 12 system review.   has hx of colon polyps    on 5 year recal  Physical Exam  General:  Well-developed,well-nourished,in no acute distress; alert,appropriate and cooperative throughout examination Head:  Normocephalic and atraumatic without obvious abnormalities. No apparent alopecia or balding. Eyes:  PERRL, EOMs full, conjunctiva clear  Ears:  R ear normal, L ear normal, and no external deformities.   Nose:  no external deformity and no nasal discharge.   Mouth:  pharynx pink  and moist.   Neck:  No deformities, masses, or tenderness noted. Chest Wall:  No deformities, masses, or tenderness noted. Breasts:  No mass, nodules, thickening, tenderness, bulging, retraction, inflamation, nipple discharge or skin changes noted.   Lungs:  Normal respiratory effort, chest expands symmetrically. Lungs are clear to auscultation, no crackles  or wheezes. Heart:  Normal rate and regular rhythm. S1 and S2 normal without gallop,, click, rub or other extra sounds.  2/  6systolic  murmur upper chest not to neck   diastole clear  Abdomen:  Bowel sounds positive,abdomen soft and non-tender without masses, organomegaly or hernias noted.  large abdomenal wall  Genitalia:  pap NI Msk:  no joint swelling and no joint warmth.  antalgic gait some oa changes  Pulses:  pulses intact without delay   Extremities:  no clubbing cyanosis or edema  Neurologic:  alert & oriented X3.  antalgic gait otherwise non foacl grossly  Skin:  turgor normal, color normal, no ecchymoses, and no petechiae.   Cervical Nodes:  No lymphadenopathy noted Psych:  Oriented X3, normally interactive, good eye contact, not anxious appearing, and not depressed appearing.     Impression & Recommendations:  Problem # 1:  PREVENTIVE HEALTH CARE (ICD-V70.0)  counseled  fall prevention  etc .   diet  sleep nutrition .  etc.   Orders: Medicare -1st Annual Wellness Visit [phone])  Problem # 2:  HYPERTENSION (ICD-401.9) Assessment: Unchanged disc er vs ir b blocker may be problematic as with other patients.   will stay on same med for now. Her updated medication list for this problem includes:    Metoprolol Succinate 25 Mg Xr24h-tab (Metoprolol succinate) .Marland Kitchen... Take one tablet by mouth daily    Losartan Potassium-hctz 50-12.5 Mg Tabs (Losartan potassium-hctz) .Marland Kitchen... 1 by mouth once daily  for high blood pressure  Orders: TLB-BMP (Basic Metabolic Panel-BMET) (80048-METABOL) TLB-CBC Platelet - w/Differential  (85025-CBCD) TLB-B12 + Folate Pnl (60454_09811-B14/NWG) TLB-IBC Pnl (Iron/FE;Transferrin) (83550-IBC) TLB-TSH (Thyroid Stimulating Hormone) (84443-TSH) Specimen Handling (95621) Venipuncture (30865) TLB-CK Total Only(Creatine Kinase/CPK) (82550-CK)  BP today: 120/80 Prior BP: 134/54 (12/23/2009)  Prior 10 Yr Risk Heart Disease: 9 % (08/21/2009)  Labs Reviewed: K+: 4.4 (08/13/2009) Creat: : 0.8 (08/13/2009)   Chol: 189 (08/13/2009)   HDL: 51.50 (08/13/2009)   LDL: 101 (08/13/2009)   TG: 181.0 (08/13/2009)  Problem # 3:  HYPERLIPIDEMIA (ICD-272.4)  Her updated medication list for this problem includes:    Crestor 10 Mg Tabs (Rosuvastatin calcium) .Marland Kitchen... Take  daily as directed 1/2 to 1 as tolerated  Labs Reviewed: SGOT: 21 (08/13/2009)   SGPT: 19 (08/13/2009)  Lipid Goals: Chol Goal: 200 (03/28/2007)   HDL Goal: 40 (03/28/2007)   LDL Goal: 130 (03/28/2007)   TG Goal: 150 (03/28/2007)  Prior 10 Yr Risk Heart Disease: 9 % (08/21/2009)   HDL:51.50 (08/13/2009), 55.00 (06/11/2009)  LDL:101 (08/13/2009), 122 (05/24/2007)  Chol:189 (08/13/2009), 272 (06/11/2009)  Trig:181.0 (08/13/2009), 193.0 (06/11/2009)  Problem # 4:  FATIGUE (ICD-780.79) Assessment: New worse over the last months .    see card check   no specific dx . obestity and dec exercise with foot issues possible .  r/o anemia and  consier OSa as a cause. Orders: TLB-BMP (Basic Metabolic Panel-BMET) (80048-METABOL) TLB-CBC Platelet - w/Differential (85025-CBCD) TLB-B12 + Folate Pnl (78469_62952-W41/LKG) TLB-IBC Pnl (Iron/FE;Transferrin) (83550-IBC) TLB-TSH (Thyroid Stimulating Hormone) (84443-TSH) TLB-CRP-High Sensitivity (C-Reactive Protein) (86140-FCRP) TLB-Ferritin (82728-FER) Specimen Handling (40102) Venipuncture (72536) TLB-CK Total Only(Creatine Kinase/CPK) (82550-CK)  Problem # 5:  OBESITY (ICD-278.00) morbid  Ht: 62.25 (12/31/2009)   Wt: 247 (12/31/2009)   BMI: 44.09 (12/23/2009)  Problem # 6:  FASTING  HYPERGLYCEMIA (ICD-790.29) Assessment: Unchanged pt related   bg   in 110 and 120 range   Orders: TLB-A1C / Hgb A1C (Glycohemoglobin) (83036-A1C) TLB-CK Total Only(Creatine Kinase/CPK) (82550-CK)  Problem # 7:  AORTIC VALVE DISORDERS (ICD-424.1) Assessment: Unchanged  Her updated medication list for this problem includes:    Aspirin 81 Mg Tbec (Aspirin) .Marland Kitchen... Take one tablet by mouth daily    Metoprolol Succinate 25 Mg Xr24h-tab (Metoprolol succinate) .Marland Kitchen... Take one tablet by mouth daily  Problem # 8:  SOB (ICD-786.05) Assessment: Unchanged on going   poss from obesity   Problem # 9:  FOOT PAIN, LEFT (ICD-729.5)  problematic and surgery rec   will prob get a second opinion.   Complete Medication List: 1)  Zegerid 40-1100 Mg Caps (Omeprazole-sodium bicarbonate) .... Take 1 capsule by mouth once a day 2)  Aspirin 81 Mg Tbec (Aspirin) .... Take one tablet by mouth daily 3)  Multivitamins Tabs (Multiple vitamin) .... Tab by mouth once daily 4)  Metoprolol Succinate 25 Mg Xr24h-tab (Metoprolol succinate) .... Take one tablet by mouth daily 5)  Losartan Potassium-hctz 50-12.5 Mg Tabs (Losartan potassium-hctz) .Marland Kitchen.. 1 by mouth once daily  for high blood pressure 6)  Crestor 10 Mg Tabs (Rosuvastatin calcium) .... Take  daily as directed 1/2 to 1 as tolerated 7)  Fluticasone Propionate 50 Mcg/act Susp (Fluticasone propionate) .... 2 sprays each nostril  each day as needed  Other Orders: Tdap => 59yrs IM (21308) Admin 1st Vaccine (65784)  Patient Instructions: 1)  You will be informed of lab results when available.  2)  then plan follow up . 3)  continue weight loss  as much as possible. 4)  Consider  sleep consult  if appropriate    Orders Added: 1)  TLB-BMP (Basic Metabolic Panel-BMET) [80048-METABOL] 2)  TLB-CBC Platelet - w/Differential [85025-CBCD] 3)  TLB-B12 + Folate Pnl [82746_82607-B12/FOL] 4)  TLB-IBC Pnl (Iron/FE;Transferrin) [83550-IBC] 5)  TLB-TSH (Thyroid Stimulating  Hormone) [84443-TSH] 6)  TLB-A1C / Hgb A1C (Glycohemoglobin) [83036-A1C] 7)  TLB-CRP-High Sensitivity (C-Reactive Protein) [86140-FCRP] 8)  TLB-Ferritin [82728-FER] 9)  Specimen Handling [99000] 10)  Venipuncture [36415] 11)  TLB-CK Total Only(Creatine Kinase/CPK) [82550-CK] 12)  Tdap => 20yrs IM [90715] 13)  Admin 1st Vaccine [90471] 14)  Medicare -1st Annual Wellness Visit [G0438] 15)  Est. Patient Level IV [69629]   Immunizations Administered:  Tetanus Vaccine:    Vaccine Type: Tdap    Site: right deltoid    Mfr: GlaxoSmithKline    Dose: 0.5 ml    Route: IM    Given by: Romualdo Bolk, CMA (AAMA)    Exp. Date: 11/07/2011    Lot #: [id number]   Immunizations Administered:  Tetanus Vaccine:    Vaccine Type: Tdap    Site: right deltoid    Mfr: GlaxoSmithKline    Dose: 0.5 ml    Route: IM    Given by: Romualdo Bolk, CMA (AAMA)    Exp. Date: 11/07/2011    Lot #: [id number]  Preventive Care Screening  Colonoscopy:    Date:  01/19/2007    Results:  normal   Pap Smear:    Date:  01/18/2006    Results:  normal   Prior Values:    Last Tetanus Booster:  Historical (03/18/1996)    Last Flu Shot:  Fluvax 3+ (10/29/2009)

## 2010-02-19 NOTE — Progress Notes (Signed)
  Walk in Patient Form Recieved " Pt left papers for Prescription" forwarded to Jonelle [facility] Mesiemore  April 08, 2009 9:15 AM

## 2010-02-19 NOTE — Assessment & Plan Note (Signed)
Summary: rov/ gd   History of Present Illness: 75 yo female whom I  saw in June 2010 for aortic stenosis. She did have a Myoview performed on April 29, 2003 that showed an ejection fraction of 67%. There was breast attenuation but no ischemia. She had an echocardiogram  performed on April 25, 2008. The LV function was normal. There is mild aortic stenosis with a mean gradient of 11 mm mercury. The left atrium is mildly dilated. She did contact the office in September of 2010 with PVCs. Toprol was added to her medical regimen. Since I last saw her she does have dyspnea on exertion relieved with rest. It is worse compared to previous but she attributes this to deconditioning as she apparently injured her leg back in the fall. She was therefore not very active. She does not have orthopnea, PND, pedal edema or exertional chest pain. There is no syncope.   Current Medications (verified): 1)  Zegerid 40-1100 Mg Caps (Omeprazole-Sodium Bicarbonate) .... Take 1 Capsule By Mouth Once A Day 2)  Micardis Hct 40-12.5 Mg  Tabs (Telmisartan-Hctz) .Marland Kitchen.. 1 By Mouth Once Daily 3)  Crestor 5 Mg Tabs (Rosuvastatin Calcium) .Marland Kitchen.. 1 Tab Every Other Day 4)  Aspirin 81 Mg Tbec (Aspirin) .... Take One Tablet By Mouth Daily 5)  Multivitamins   Tabs (Multiple Vitamin) .... Tab By Mouth Once Daily 6)  Metoprolol Succinate 25 Mg Xr24h-Tab (Metoprolol Succinate) .... Take One Tablet By Mouth Daily  Allergies: No Known Drug Allergies  Past History:  Past Medical History: HYPERTENSION (ICD-401.9) HYPERLIPIDEMIA (ICD-272.4) UNSPECIFIED OSTEOPOROSIS (ICD-733.00) GEN OSTEOARTHROSIS INVOLVING MULTIPLE SITES (ICD-715.09) IMPAIRED FASTING GLUCOSE (ICD-790.21) OBESITY (ICD-278.00) GERD (ICD-530.81) DIVERTICULITIS, HX OF (ICD-V12.79) Aortic Stenosis  Past Surgical History: Reviewed history from 10/03/2007 and no changes required. Appendectomy Cholecystectomy Dilatation EGD Rt Knee  and foot Surgery  Lf Knee  Tear Tonsillectomy  Social History: Reviewed history from 11/08/2008 and no changes required. Married Former Smoker Alcohol use-no Drug use-no Regular exercise-no  except swim 2 x per week no ets  hh of 3 ,   pet dog.  paraplegic  49 years   taken care of 73 yo gc sometimes   Review of Systems       no fevers or chills, productive cough, hemoptysis, dysphasia, odynophagia, melena, hematochezia, dysuria, hematuria, rash, seizure activity, orthopnea, PND, pedal edema, claudication. Remaining systems are negative.   Vital Signs:  Patient profile:   75 year old female Menstrual status:  postmenopausal Height:      62 inches Weight:      242 pounds BMI:     44.42 Pulse rate:   75 / minute Resp:     14 per minute BP sitting:   120 / 80  (left arm)  Vitals Entered By: Kem Parkinson (March 20, 2009 11:22 AM)  Physical Exam  General:  Well-developed well-nourished in no acute distress.  Skin is warm and dry.  HEENT is normal.  Neck is supple. No thyromegaly.  Chest is clear to auscultation with normal expansion.  Cardiovascular exam is regular rate and rhythm. 2/6 systolic murmur left sternal border. S2 is not diminished. Abdominal exam nontender or distended. No masses palpated. Extremities show trace edema. neuro grossly intact    EKG  Procedure date:  03/20/2009  Findings:      Normal sinus rhythm at a rate of 75. No ST changes.  Impression & Recommendations:  Problem # 1:  AORTIC VALVE DISORDERS (ICD-424.1) Repeat echocardiogram. Her updated medication list for  this problem includes:    Micardis Hct 40-12.5 Mg Tabs (Telmisartan-hctz) .Marland Kitchen... 1 by mouth once daily    Metoprolol Succinate 25 Mg Xr24h-tab (Metoprolol succinate) .Marland Kitchen... Take one tablet by mouth daily  Problem # 2:  SOB (ICD-786.05) I will check an echocardiogram to reassess LV function and aortic valve. Check BNP. However this is chronic. There may be a contribution from obesity hypoventilation  syndrome and deconditioning. Given recent leg injury I will schedule a d-dimer to screen for pulmonary embolus although I think this is unlikely. Her updated medication list for this problem includes:    Micardis Hct 40-12.5 Mg Tabs (Telmisartan-hctz) .Marland Kitchen... 1 by mouth once daily    Aspirin 81 Mg Tbec (Aspirin) .Marland Kitchen... Take one tablet by mouth daily    Metoprolol Succinate 25 Mg Xr24h-tab (Metoprolol succinate) .Marland Kitchen... Take one tablet by mouth daily  Orders: TLB-BNP (B-Natriuretic Peptide) (83880-BNPR) T-D-Dimer Fibrin Derivatives Quantitive [phone])  Problem # 3:  HYPERTENSION (ICD-401.9) Blood pressure controlled on present medications. Will continue. Her updated medication list for this problem includes:    Micardis Hct 40-12.5 Mg Tabs (Telmisartan-hctz) .Marland Kitchen... 1 by mouth once daily    Aspirin 81 Mg Tbec (Aspirin) .Marland Kitchen... Take one tablet by mouth daily    Metoprolol Succinate 25 Mg Xr24h-tab (Metoprolol succinate) .Marland Kitchen... Take one tablet by mouth daily  Her updated medication list for this problem includes:    Micardis Hct 40-12.5 Mg Tabs (Telmisartan-hctz) .Marland Kitchen... 1 by mouth once daily    Aspirin 81 Mg Tbec (Aspirin) .Marland Kitchen... Take one tablet by mouth daily    Metoprolol Succinate 25 Mg Xr24h-tab (Metoprolol succinate) .Marland Kitchen... Take one tablet by mouth daily  Problem # 4:  HYPERLIPIDEMIA (ICD-272.4) Continue statin. Lipids and liver monitored by primary care. Her updated medication list for this problem includes:    Crestor 5 Mg Tabs (Rosuvastatin calcium) .Marland Kitchen... 1 tab every other day  Her updated medication list for this problem includes:    Crestor 5 Mg Tabs (Rosuvastatin calcium) .Marland Kitchen... 1 tab every other day  Problem # 5:  OBESITY (ICD-278.00)  Problem # 6:  GERD (ICD-530.81)  Her updated medication list for this problem includes:    Zegerid 40-1100 Mg Caps (Omeprazole-sodium bicarbonate) .Marland Kitchen... Take 1 capsule by mouth once a day  Other Orders: Echocardiogram (Echo)  Patient  Instructions: 1)  Your physician recommends that you schedule a follow-up appointment in: 9 MONTHS 2)  Your physician has requested that you have an echocardiogram.  Echocardiography is a painless test that uses sound waves to create images of your heart. It provides your doctor with information about the size and shape of your heart and how well your heart's chambers and valves are working.  This procedure takes approximately one hour. There are no restrictions for this procedure.

## 2010-02-19 NOTE — Miscellaneous (Signed)
Summary: Immunization Entry   Immunization History:  Influenza Immunization History:    Influenza:  fluvax 3+ (10/29/2009)

## 2010-02-19 NOTE — Assessment & Plan Note (Signed)
Summary: 2 mo rov/mm   Vital Signs:  Patient profile:   75 year old female Menstrual status:  postmenopausal Weight:      245 pounds Pulse rate:   88 / minute BP sitting:   120 / 80  (left arm) Cuff size:   large  Vitals Entered By: Romualdo Bolk, CMA (AAMA) (August 21, 2009 9:36 AM)  CC: Follow-up visit on labs, Hypertension Management   History of Present Illness: Anna Gamble comes in today  for follow up of BP med change and lipids mangement .among other problems  Since last visit  here  there have been no major changes in health status  but did have worsenin gof her cough   BP : cough   better but still present.  ;after last visit  got worse with a  sinus drainage.   cough and then sneezing.spells  right    side.   cough sensation. mostly on the right throat area.   LIPIDS:  taking 5 mg   dose  and doing ok .  some  se when goes up on doing but raking fairly regularly . Back and abd pain. took antibiotic got better and thinks it was a uti. GERD: no change . OA no change considering to have surgery knee replacement  Hypertension History:      She denies headache, chest pain, palpitations, dyspnea with exertion, orthopnea, PND, peripheral edema, visual symptoms, neurologic problems, syncope, and side effects from treatment.  She notes no problems with any antihypertensive medication side effects.        Positive major cardiovascular risk factors include female age 38 years old or older, hyperlipidemia, and hypertension.  Negative major cardiovascular risk factors include non-tobacco-user status.        Further assessment for target organ damage reveals no history of peripheral vascular disease.     Preventive Screening-Counseling & Management  Alcohol-Tobacco     Alcohol drinks/day: 0     Smoking Status: quit     Year Quit: 40 years ago  Caffeine-Diet-Exercise     Caffeine use/day: 2     Does Patient Exercise: yes     Type of exercise: swim     Exercise (avg:  min/session): 30-60     Times/week: 3  Current Medications (verified): 1)  Zegerid 40-1100 Mg Caps (Omeprazole-Sodium Bicarbonate) .... Take 1 Capsule By Mouth Once A Day 2)  Aspirin 81 Mg Tbec (Aspirin) .... Take One Tablet By Mouth Daily 3)  Multivitamins   Tabs (Multiple Vitamin) .... Tab By Mouth Once Daily 4)  Metoprolol Succinate 25 Mg Xr24h-Tab (Metoprolol Succinate) .... Take One Tablet By Mouth Daily 5)  Losartan Potassium-Hctz 50-12.5 Mg Tabs (Losartan Potassium-Hctz) .Marland Kitchen.. 1 By Mouth Once Daily  For High Blood Pressure 6)  Crestor 10 Mg Tabs (Rosuvastatin Calcium) .... Take 1 By Mouth Every Other Day or As Directed  Allergies: 1)  ! Lisinopril (Lisinopril) 2)  * Statins  Past History:  Past medical, surgical, family and social histories (including risk factors) reviewed, and no changes noted (except as noted below).  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 Appendectomy Cholecystectomy Dilatation EGD Rt Knee  and foot Surgery  Lf Knee Tear Tonsillectomy  Past History:  Care Management:  Orthopedics: Dr. Ernesto Rutherford Gastroenterology: Dr. Kinnie Scales Cards:   Jens Som  Family History: Reviewed history from 06/18/2008 and no changes required. Family History of CAD Female 1st degree relative  Family History of CAD Female 1st degree relative 57s  Family History High cholesterol Family History Hypertension  Brother died of MI and cva at age 43  Brother died of CVA Mother and father died of MI in 44's Son is paraplegic    Social History: Reviewed history from 11/08/2008 and no changes required. Married Former  Smoker Alcohol use-no Drug use-no Regular exercise-no  except swim 2 x per week no ets  hh of 3 ,   pet dog.  paraplegic  49 years   taken care of 73 yo gc sometimes   Review of Systems       The patient complains of prolonged cough.  The patient denies anorexia, fever, weight gain, vision loss, decreased hearing, hoarseness, chest pain, syncope, peripheral edema, hemoptysis, abdominal pain, melena, hematochezia, severe indigestion/heartburn, muscle weakness, transient blindness, difficulty walking, abnormal bleeding, enlarged lymph nodes, and angioedema.         some right sided face pressure at times .   rest see hpi   Physical Exam  General:  Well-developed,well-nourished,in no acute distress; alert,appropriate and cooperative throughout examination Head:  normocephalic and atraumatic.   Eyes:  vision grossly intact, pupils equal, and pupils round.   Ears:  R ear normal, L ear normal, and no external deformities.   Nose:  no external deformity, no external erythema, and no nasal discharge.   Mouth:  pharynx pink and moist.  no lesions   no edema Neck:  No deformities, masses, or tenderness noted. Lungs:  Normal respiratory effort, chest expands symmetrically. Lungs are clear to auscultation, no crackles or wheezes.no dullness.   Heart:  Normal rate and regular rhythm. S1 and S2 normal without gallop,, click, rub or other extra sounds.  2/  6systolic  murmur upper chest not to neck   diastole clear  Msk:  no joint swelling and no joint warmth.   Pulses:  pulses intact without delay   Extremities:  no clubbing cyanosis or edema  Neurologic:  alert & oriented X3 and strength normal in all extremities.  mildly antalgic giat  Skin:  turgor normal, color normal, no suspicious lesions, no ecchymoses, and no petechiae.   Cervical Nodes:  No lymphadenopathy noted Psych:  Oriented X3, normally interactive, good eye contact, and not anxious appearing.   reviewed labs with  patient  Impression & Recommendations:  Problem # 1:  COUGH (ICD-786.2) better   but seems to be right    sided    triggered per pt hx        poss  localized    .   Will  continue  arb instead of ace  add med for rhinitis. and assess response   Problem # 2:  HYPERTENSION (ICD-401.9) Assessment: Unchanged  ok after med change  labs reviewed The following medications were removed from the medication list:    Lisinopril-hydrochlorothiazide 20-12.5 Mg Tabs (Lisinopril-hydrochlorothiazide) ..... One tablet by mouth once daily Her updated medication list for this problem includes:    Metoprolol Succinate 25 Mg Xr24h-tab (Metoprolol succinate) .Marland Kitchen... Take one tablet by mouth daily    Losartan Potassium-hctz 50-12.5 Mg Tabs (Losartan potassium-hctz) .Marland Kitchen... 1 by mouth once daily  for high blood pressure  BP today: 120/80 Prior BP: 110/70 (06/19/2009)  10 Yr Risk Heart Disease: 9 % Prior 10 Yr Risk Heart  Disease: 11 % (11/08/2008)  Labs Reviewed: K+: 4.4 (08/13/2009) Creat: : 0.8 (08/13/2009)   Chol: 189 (08/13/2009)   HDL: 51.50 (08/13/2009)   LDL: 101 (08/13/2009)   TG: 181.0 (08/13/2009)  Problem # 3:  HYPERLIPIDEMIA (ICD-272.4) Assessment: Improved  tolerating mostly equivalent of 5 mg per day   disc this   labs better  Her updated medication list for this problem includes:    Crestor 10 Mg Tabs (Rosuvastatin calcium) .Marland Kitchen... Take  daily as directed 1/2 to 1 as tolerated  Labs Reviewed: SGOT: 21 (08/13/2009)   SGPT: 19 (08/13/2009)  Lipid Goals: Chol Goal: 200 (03/28/2007)   HDL Goal: 40 (03/28/2007)   LDL Goal: 130 (03/28/2007)   TG Goal: 150 (03/28/2007)  10 Yr Risk Heart Disease: 9 % Prior 10 Yr Risk Heart Disease: 11 % (11/08/2008)   HDL:51.50 (08/13/2009), 55.00 (06/11/2009)  LDL:101 (08/13/2009), 122 (05/24/2007)  Chol:189 (08/13/2009), 272 (06/11/2009)  Trig:181.0 (08/13/2009), 193.0 (06/11/2009)  Problem # 4:  ABDOMINAL PAIN, LEFT LOWER QUADRANT (ICD-789.04) Assessment:  Improved uti vs divertic   better  after antibiotic  Problem # 5:  OBESITY (ICD-278.00)  Ht: 62 (03/20/2009)   Wt: 245 (08/21/2009)   BMI: 44.42 (03/20/2009)  Problem # 6:  GEN OSTEOARTHROSIS INVOLVING MULTIPLE SITES (ICD-715.09)  Her updated medication list for this problem includes:    Aspirin 81 Mg Tbec (Aspirin) .Marland Kitchen... Take one tablet by mouth daily  Problem # 7:  FASTING HYPERGLYCEMIA (ICD-790.29) Assessment: Unchanged fbs 103   lifestyle intervention   Complete Medication List: 1)  Zegerid 40-1100 Mg Caps (Omeprazole-sodium bicarbonate) .... Take 1 capsule by mouth once a day 2)  Aspirin 81 Mg Tbec (Aspirin) .... Take one tablet by mouth daily 3)  Multivitamins Tabs (Multiple vitamin) .... Tab by mouth once daily 4)  Metoprolol Succinate 25 Mg Xr24h-tab (Metoprolol succinate) .... Take one tablet by mouth daily 5)  Losartan Potassium-hctz 50-12.5 Mg Tabs (Losartan potassium-hctz) .Marland Kitchen.. 1 by mouth once daily  for high blood pressure 6)  Crestor 10 Mg Tabs (Rosuvastatin calcium) .... Take  daily as directed 1/2 to 1 as tolerated 7)  Fluticasone Propionate 50 Mcg/act Susp (Fluticasone propionate) .... 2 sprays each nostril  each day  Hypertension Assessment/Plan:      The patient's hypertensive risk group is category B: At least one risk factor (excluding diabetes) with no target organ damage.  Her calculated 10 year risk of coronary heart disease is 9 %.  Today's blood pressure is 120/80.  Her blood pressure goal is < 140/90.  Patient Instructions: 1)  start the flonase to see if helps the cough . 2)  continue on other meds . 3)  call in 3 weeks and if not getting better we can refer to ENT  .  4)  schedule  annual wellness visit   in 4 months or as needed.  Prescriptions: FLUTICASONE PROPIONATE 50 MCG/ACT SUSP (FLUTICASONE PROPIONATE) 2 sprays each nostril  each day  #1 x 3   Entered and Authorized by:   [name] MD   Signed by:   [name] MD on 08/21/2009   Method  used:   Electronically to        [facility]. [address] [phone]* (retail)       [address]       [place], [place]  [address]       Ph: [phone] or [phone]  Fax: [phone]   RxID:   [id number] LOSARTAN POTASSIUM-HCTZ 50-12.5 MG TABS (LOSARTAN POTASSIUM-HCTZ) 1 by mouth once daily  for high blood pressure  #30 x 3   Entered and Authorized by:   [name] MD   Signed by:   [name] MD on 08/21/2009   Method used:   Print then Give to Patient   RxID:   [id number] CRESTOR 10 MG TABS (ROSUVASTATIN CALCIUM) take  daily as directed 1/2 to 1 as tolerated  #90 x 2   Entered and Authorized by:   [name] MD   Signed by:   [name] MD on 08/21/2009   Method used:   Print then Give to Patient   RxID:   [id number]

## 2010-02-19 NOTE — Miscellaneous (Signed)
  Clinical Lists Changes pt requested to change from micardis to something less expensive. per dr Jens Som will switch to lisinopril hctz. pt aware and will call with any problems Deliah Goody, RN  April 09, 2009 3:57 PM  Medications: Changed medication from MICARDIS HCT 40-12.5 MG  TABS (TELMISARTAN-HCTZ) 1 by mouth once daily to LISINOPRIL-HYDROCHLOROTHIAZIDE 20-12.5 MG TABS (LISINOPRIL-HYDROCHLOROTHIAZIDE) one tablet by mouth once daily - Signed Rx of LISINOPRIL-HYDROCHLOROTHIAZIDE 20-12.5 MG TABS (LISINOPRIL-HYDROCHLOROTHIAZIDE) one tablet by mouth once daily;  #30 x 12;  Signed;  Entered by: Deliah Goody, RN;  Authorized by: Ferman Hamming, MD, [facility];  Method used: Print then Give to Patient Rx of LISINOPRIL-HYDROCHLOROTHIAZIDE 20-12.5 MG TABS (LISINOPRIL-HYDROCHLOROTHIAZIDE) one tablet by mouth once daily;  #90 x 3;  Signed;  Entered by: Deliah Goody, RN;  Authorized by: Ferman Hamming, MD, [facility];  Method used: Print then Give to Patient Rx of METOPROLOL SUCCINATE 25 MG XR24H-TAB (METOPROLOL SUCCINATE) Take one tablet by mouth daily;  #90 x 3;  Signed;  Entered by: Deliah Goody, RN;  Authorized by: Ferman Hamming, MD, [facility];  Method used: Print then Give to Patient    Prescriptions: METOPROLOL SUCCINATE 25 MG XR24H-TAB (METOPROLOL SUCCINATE) Take one tablet by mouth daily  #90 x 3   Entered by:   Deliah Goody, RN   Authorized by:   Ferman Hamming, MD, [facility]   Signed by:   Deliah Goody, RN on 04/09/2009   Method used:   Print then Give to Patient   RxID:   [id number] LISINOPRIL-HYDROCHLOROTHIAZIDE 20-12.5 MG TABS (LISINOPRIL-HYDROCHLOROTHIAZIDE) one tablet by mouth once daily  #90 x 3   Entered by:   Deliah Goody, RN   Authorized by:   Ferman Hamming, MD, [facility]   Signed by:   Deliah Goody, RN on 04/09/2009   Method used:   Print then Give to Patient   RxID:   [id number] LISINOPRIL-HYDROCHLOROTHIAZIDE 20-12.5 MG TABS  (LISINOPRIL-HYDROCHLOROTHIAZIDE) one tablet by mouth once daily  #30 x 12   Entered by:   Deliah Goody, RN   Authorized by:   Ferman Hamming, MD, [facility]   Signed by:   Deliah Goody, RN on 04/09/2009   Method used:   Print then Give to Patient   RxID:   [id number]

## 2010-02-26 ENCOUNTER — Institutional Professional Consult (permissible substitution) (INDEPENDENT_AMBULATORY_CARE_PROVIDER_SITE_OTHER): Payer: Medicare Other | Admitting: Internal Medicine

## 2010-02-26 ENCOUNTER — Other Ambulatory Visit: Payer: Self-pay | Admitting: Internal Medicine

## 2010-02-26 ENCOUNTER — Encounter: Payer: Self-pay | Admitting: Internal Medicine

## 2010-02-26 ENCOUNTER — Encounter (INDEPENDENT_AMBULATORY_CARE_PROVIDER_SITE_OTHER): Payer: Self-pay | Admitting: *Deleted

## 2010-02-26 ENCOUNTER — Other Ambulatory Visit: Payer: Medicare Other

## 2010-02-26 ENCOUNTER — Ambulatory Visit (INDEPENDENT_AMBULATORY_CARE_PROVIDER_SITE_OTHER)
Admission: RE | Admit: 2010-02-26 | Discharge: 2010-02-26 | Disposition: A | Discharge status: Home / Self Care | Payer: Medicare Other | Source: Ambulatory Visit | Attending: Internal Medicine | Admitting: Internal Medicine

## 2010-02-26 DIAGNOSIS — R05 Cough: Secondary | ICD-10-CM

## 2010-02-26 DIAGNOSIS — R059 Cough, unspecified: Secondary | ICD-10-CM

## 2010-02-26 DIAGNOSIS — R0602 Shortness of breath: Secondary | ICD-10-CM

## 2010-02-26 DIAGNOSIS — E669 Obesity, unspecified: Secondary | ICD-10-CM

## 2010-03-05 NOTE — Assessment & Plan Note (Signed)
Summary: Pulmonary/ new pt eval for doe with walking sats = ok x 2 laps   Copy to:  Dr. Berniece Andreas Primary Provider/Referring Provider:  [name] MD  CC:  Dyspnea.  History of Present Illness: 10 yowf minimal smoker as teenager  seen in pulmonary 2005 with dx of obesity related sob and reflux related cough at wt 253    February 26, 2010  1st pulmonary office eval in EMR era cc indlolent onset progressive  doe x 50 ft uses [employer] parking and leans on cart but still able to go up and down the aisle at [facility].    no sign cough or excess mucus production, sleeps ok flat, no resting sob.   does ok swimming unless goes fast, also limited by legs and back walking but not swimming.  Pt denies any significant sore throat, dysphagia, itching, sneezing,  nasal congestion or excess secretions,  fever, chills, sweats, unintended wt loss, pleuritic or exertional cp, hempoptysis, change in activity tolerance  orthopnea pnd or leg swelling Pt also denies any obvious fluctuation in symptoms with weather or environmental change or other alleviating or aggravating factors.       Current Medications (verified): 1)  Zegerid 40-1100 Mg Caps (Omeprazole-Sodium Bicarbonate) .... Take 1 Capsule By Mouth Once A Day 2)  Aspirin 81 Mg Tbec (Aspirin) .... Take One Tablet By Mouth Daily 3)  Multivitamins   Tabs (Multiple Vitamin) .... Tab By Mouth Once Daily 4)  Metoprolol Succinate 25 Mg Xr24h-Tab (Metoprolol Succinate) .... Take One Tablet By Mouth Daily 5)  Losartan Potassium-Hctz 50-12.5 Mg Tabs (Losartan Potassium-Hctz) .Marland Kitchen.. 1 By Mouth Once Daily  For High Blood Pressure 6)  Crestor 10 Mg Tabs (Rosuvastatin Calcium) .... Take  Daily As Directed 1/2 To 1 As Tolerated 7)  Fluticasone Propionate 50 Mcg/act Susp (Fluticasone Propionate) .... 2 Sprays Each Nostril  Each Day As Needed  Allergies (verified): 1)  ! Lisinopril (Lisinopril) 2)  * Statins  Past History:  Past Medical History: HYPERTENSION  (ICD-401.9) HYPERLIPIDEMIA (ICD-272.4) UNSPECIFIED OSTEOPOROSIS (ICD-733.00) GEN OSTEOARTHROSIS INVOLVING MULTIPLE SITES (ICD-715.09) IMPAIRED FASTING GLUCOSE (ICD-790.21) OBESITY (ICD-278.00) GERD (ICD-530.81) DIVERTICULITIS, HX OF (ICD-V12.79) Aortic Stenosis  mild on echo 3 /2011  Colon polyps  GERD with stricture....................................Marland KitchenMedoff Dyspnea     - PFT's 09/20/2003 FEV1   2.28 (113%) ratio 74 and Nl DLCO    ERV 34%     - Walking sats February 27, 2010  ok x 2 laps  Family History: Reviewed history from 12/31/2009 and no changes required. Family History of CAD Female 1st degree relative  Family History of CAD Female 1st degree relative 74s  Family History High cholesterol Family History Hypertension  Brother died of MI and cva at age 58  Brother died of CVA Mother and father died of MI in 63's Son is paraplegic Son has sleep apnea  hysband had surgery for OSA ans snoring.    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 Alcohol use-no Drug use-no Regular exercise-no  except swim 2 x per weekcat no ets  hh of 3 ,   pet dog.  paraplegic  49 years   taken care of 75 yo gc sometimes   Review of Systems       The patient complains of shortness of breath with activity, non-productive cough, abdominal pain, tooth/dental problems, and joint stiffness or pain.  The patient denies shortness of breath at rest, productive cough, coughing up blood, chest  pain, irregular heartbeats, acid heartburn, indigestion, loss of appetite, weight change, difficulty swallowing, sore throat, headaches, nasal congestion/difficulty breathing through nose, sneezing, itching, ear ache, anxiety, depression, hand/feet swelling, rash, change in color of mucus, and fever.    Vital Signs:  Patient profile:   75 year old female Menstrual status:  postmenopausal Weight:      254 pounds O2 Sat:      92 % on Room air Temp:     97.7 degrees F oral Pulse  rate:   85 / minute BP sitting:   124 / 78  (left arm) Cuff size:   large  Vitals Entered By: Vernie Murders (February 26, 2010 3:36 PM)  O2 Flow:  Room air  Serial Vital Signs/Assessments:  Comments: Ambulatory Pulse Oximetry  Resting; HR__70___    02 Sat_965ra____  Lap1 (185 feet)   HR_108____   02 Sat__93%RA___ Lap2 (185 feet)   HR_11____   02 Sat_93%RA____    Lap3 (185 feet)   HR_____   02 Sat_____  ___Test Completed without Difficulty _X__Test Stopped due to: pt c/o back and hip pain [facility], LPN  February 26, 2010 4:17 PM    By: Renold Genta RCP, LPN    Physical Exam  Additional Exam:  wt 247 > 254 February 26, 2010  HEENT: nl dentition, turbinates, and orophanx. Nl external ear canals without cough reflex NECK :  without JVD/Nodes/TM/ nl carotid upstrokes bilaterally LUNGS: no acc muscle use, clear to A and P bilaterally without cough on insp or exp maneuvers CV:  RRR  no s3 or murmur or increase in P2, no edema  ABD:  soft and nontender with nl excursion in the supine position. No bruits or organomegaly, bowel sounds nl MS:  warm without deformities, calf tenderness, cyanosis or clubbing SKIN: warm and dry without lesions   NEURO:  alert, approp, no deficits     CXR  Procedure date:  02/26/2010  Findings:        Comparison: 03/21/2009   Findings: Heart size is normal.  Mediastinal shadows are normal except for calcification of the thoracic aorta.  The lungs are clear.  No infiltrate, mass, effusion or collapse.  No definite bronchial thickening.  Ordinary degenerative changes effect the spine.   IMPRESSION: No active disease  Impression & Recommendations:  Problem # 1:  SOB (ICD-786.05) Past pft's @ wt 246 reviewed and strongly point to obesity as main issue as disproportionate reduction in erv was the only finding;  note not sob with swimming due to buoyancy benefits with obesity. Does have report of AS so monitor BNP  reasonable.     Problem # 2:  COUGH (ICD-786.2) Intermittent concern - no clinical evidence of asthma Linked to GERD previously and does have EGD proof of GERD so needs to maintain diet/ ppi and f/u with Medoff as planned     Problem # 3:  OBESITY (ICD-278.00)  TSH nl   Weight control is a matter of calorie balance which needs to be tilted in the pt's favor by eating less and exercising more.  Specifically, I recommended  exercise at a level where pt  is short of breath but not out of breath 30 minutes daily.  If not losing weight on this program, I would strongly recommend pt see a nutritionist with a food diary recorded for two weeks prior to the visit.     Orders: New Patient Level V (28413)  Other Orders: TLB-BNP (B-Natriuretic Peptide) (83880-BNPR) T-2  View CXR (71020TC) Pulse Oximetry, Ambulatory (04540)  Patient Instructions: 1)  GERD (REFLUX)  is a common cause of respiratory symptoms. It commonly presents without heartburn and can be treated with medication, but also with lifestyle changes including avoidance of late meals, excessive alcohol, smoking cessation, and avoid fatty foods, chocolate, peppermint, colas, red wine, and acidic juices such as orange juice. NO MINT OR MENTHOL PRODUCTS SO NO COUGH DROPS  2)  USE SUGARLESS CANDY INSTEAD (jolley ranchers)  3)  NO OIL BASED VITAMINS  4)  Exercise at a pace where you are short of breath but never out of breath for 30 min daily 5)  Please schedule a follow-up appointment in 6 weeks, sooner if needed

## 2010-03-30 ENCOUNTER — Encounter: Payer: Self-pay | Admitting: Internal Medicine

## 2010-04-09 ENCOUNTER — Ambulatory Visit (INDEPENDENT_AMBULATORY_CARE_PROVIDER_SITE_OTHER): Payer: Medicare Other | Admitting: Internal Medicine

## 2010-04-09 ENCOUNTER — Encounter: Payer: Self-pay | Admitting: Internal Medicine

## 2010-04-09 VITALS — BP 130/78 | HR 70 | Temp 97.9°F | Ht 63.0 in | Wt 254.0 lb

## 2010-04-09 DIAGNOSIS — R0602 Shortness of breath: Secondary | ICD-10-CM

## 2010-04-09 NOTE — Assessment & Plan Note (Signed)
I had an extended discussion with the patient today lasting 15 to 20 minutes of a 25 minute visit on the following issues:  This is classic obesity deconditioning with nl bnp and no desat or tachypnea walking for [place] here and cannot be solved by any medication or 02.  See instructions re calorie balance discussion

## 2010-04-09 NOTE — Patient Instructions (Addendum)
Weight control is simply a matter of calorie balance which needs to be tilted in your favor by eating less and exercising more.  To get the most out of exercise, you need to be continuously aware that you are short of breath, but never out of breath, for 30 minutes daily. As you improve, it will actually be easier for you to do the same amount of exercise  in  30 minutes so always push to the level where you are short of breath.  If this does not result in gradual weight reduction then I strongly recommend you see a nutritionist with a food diary x 2 weeks so that we can work out a negative calorie balance which is universally effective in steady weight loss programs.  Think of your calorie balance like you do your bank account where in this case you want the balance to go down so you must take in less calories than you burn up.  It's just that simple:  Hard to do, but easy to understand.  Even 10-15 lbs will begin to help.  One pound a week is all you need to accomplish.  We will see you in the pulmonary clinic if your breathing is what's stopping you from desired activities

## 2010-04-09 NOTE — Progress Notes (Signed)
Subjective:    Patient ID: Anna Gamble, female    DOB: 04-15-1935, 75 y.o.   MRN: [id number]  HPISummary:    Pulmonary/ new pt eval for doe with walking sats = ok x 2 laps    Primary Provider/Referring Provider: [name] MD  CC: Dyspnea.    History of Present Illness:  81 yowf minimal smoker as teenager seen in pulmonary 2005 with dx of obesity related sob and reflux related cough at wt 253   February 26, 2010 1st pulmonary office eval in EMR era cc indlolent onset progressive doe x 50 ft uses [facility] parking and leans on cart but still able to go up and down the aisle at [facility]. no sign cough or excess mucus production, sleeps ok flat, no resting sob. does ok swimming unless goes fast, also limited by legs and back walking but not swimming- imp was morbid obesity  rec wt loss, reconditioning  04/09/2010 ov cc  Breathing no better or worse, able to do water aerobics and only sob at end of exercising.  Not losing any weight.  Pt denies any significant sore throat, dysphagia, itching, sneezing,  nasal congestion or excess/ purulent secretions,  fever, chills, sweats, unintended wt loss, pleuritic or exertional cp, hempoptysis, orthopnea pnd or leg swelling.    Also denies any obvious fluctuation of symptoms with weather or environmental changes or other aggravating or alleviating factors   Current Outpatient Prescriptions on File Prior to Visit  Medication Sig Dispense Refill  . aspirin 81 MG tablet 1 tablet daily       . fluticasone (VERAMYST) 27.5 MCG/SPRAY nasal spray 2 sprays each nostril daily as needed       . losartan-hydrochlorothiazide (HYZAAR) 50-12.5 MG per tablet 1 tablet daily.        . metoprolol succinate (TOPROL-XL) 25 MG 24 hr tablet 1 tablet daily       . multivitamin (THERAGRAN) per tablet 1 tablet daily.        Marland Kitchen omeprazole-sodium bicarbonate (ZEGERID) 40-1100 MG per capsule 1 capsule daily       . rosuvastatin (CRESTOR) 10 MG tablet 1/2 to 1 tablet as directed            Allergies (verified):  1) ! 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  HENT: Negative for ear pain, nosebleeds, congestion, sore throat, rhinorrhea, sneezing, trouble swallowing, dental problem, voice change, postnasal drip and sinus pressure.   Eyes: Negative for visual disturbance.  Respiratory: Negative for cough, choking and shortness of breath.   Cardiovascular: Negative for chest pain and leg swelling.  Gastrointestinal: Negative for vomiting, abdominal pain and diarrhea.  Genitourinary: Negative for difficulty urinating.  Musculoskeletal: Negative for arthralgias.  Skin: Negative for rash.  Neurological: Negative for tremors, syncope and headaches.  Hematological: Does not bruise/bleed easily.  Objective:   Physical Exam amb obese pleasant wf walks with cane   Additional Exam: wt 247 > 254 February 26, 2010  > 254 04/09/2010  HEENT: nl dentition, turbinates, and orophanx. Nl external ear canals without cough reflex  NECK : without JVD/Nodes/TM/ nl carotid upstrokes bilaterally  LUNGS: no acc muscle use, clear to A and P bilaterally without cough on insp or exp maneuvers  CV: RRR no s3 or murmur or increase in P2, no edema  ABD: soft and nontender with nl excursion in the supine position. No bruits or  organomegaly, bowel sounds nl  MS: warm without deformities, calf tenderness, cyanosis or clubbing  SKIN: warm and dry without lesions  NEURO: alert, approp, no deficits      Assessment & Plan:

## 2010-08-14 ENCOUNTER — Telehealth: Payer: Self-pay | Admitting: Internal Medicine

## 2010-08-14 MED ORDER — ROSUVASTATIN CALCIUM 10 MG PO TABS: 10.0000 mg | ORAL_TABLET | Freq: Every day | ORAL | Status: DC

## 2010-08-14 NOTE — Telephone Encounter (Signed)
Refill crestor 10mg  1/2 daily. Send prescription solutions----ph----[phone]. Her cpx is 01-13-2011.

## 2010-09-03 ENCOUNTER — Telehealth: Payer: Self-pay | Admitting: *Deleted

## 2010-09-03 MED ORDER — LOSARTAN POTASSIUM-HCTZ 50-12.5 MG PO TABS: ORAL_TABLET | ORAL | Status: DC

## 2010-09-03 NOTE — Telephone Encounter (Signed)
Refill on losartan/hctz

## 2010-09-10 ENCOUNTER — Encounter: Payer: Self-pay | Admitting: Internal Medicine

## 2010-11-23 ENCOUNTER — Telehealth: Payer: Self-pay | Admitting: Family Medicine

## 2010-11-23 MED ORDER — LOSARTAN POTASSIUM-HCTZ 50-12.5 MG PO TABS: ORAL_TABLET | ORAL | Status: DC

## 2010-11-23 NOTE — Telephone Encounter (Signed)
Pt called in needing a refill on her Losartan/HCTZ tabs. Please call in 90 days supply to [facility] - mail order.

## 2010-11-23 NOTE — Telephone Encounter (Signed)
rx sent to pharmacy

## 2010-12-25 ENCOUNTER — Other Ambulatory Visit: Payer: Self-pay | Admitting: Orthopedic Surgery

## 2011-01-13 ENCOUNTER — Ambulatory Visit (INDEPENDENT_AMBULATORY_CARE_PROVIDER_SITE_OTHER): Payer: Medicare Other | Admitting: Internal Medicine

## 2011-01-13 ENCOUNTER — Encounter: Payer: Self-pay | Admitting: Internal Medicine

## 2011-01-13 VITALS — BP 112/70 | HR 78 | Temp 97.9°F | Ht 62.75 in | Wt 270.0 lb

## 2011-01-13 DIAGNOSIS — I10 Essential (primary) hypertension: Secondary | ICD-10-CM

## 2011-01-13 DIAGNOSIS — R0602 Shortness of breath: Secondary | ICD-10-CM

## 2011-01-13 DIAGNOSIS — E785 Hyperlipidemia, unspecified: Secondary | ICD-10-CM

## 2011-01-13 DIAGNOSIS — R7301 Impaired fasting glucose: Secondary | ICD-10-CM

## 2011-01-13 DIAGNOSIS — E669 Obesity, unspecified: Secondary | ICD-10-CM

## 2011-01-13 DIAGNOSIS — Z01818 Encounter for other preprocedural examination: Secondary | ICD-10-CM

## 2011-01-13 DIAGNOSIS — K219 Gastro-esophageal reflux disease without esophagitis: Secondary | ICD-10-CM | POA: Insufficient documentation

## 2011-01-13 DIAGNOSIS — R7309 Other abnormal glucose: Secondary | ICD-10-CM

## 2011-01-13 DIAGNOSIS — M159 Polyosteoarthritis, unspecified: Secondary | ICD-10-CM

## 2011-01-13 DIAGNOSIS — I359 Nonrheumatic aortic valve disorder, unspecified: Secondary | ICD-10-CM

## 2011-01-13 DIAGNOSIS — Z0000 Encounter for general adult medical examination without abnormal findings: Secondary | ICD-10-CM

## 2011-01-13 LAB — CBC WITH DIFFERENTIAL/PLATELET
Basophils Relative: 0.5 % (ref 0.0–3.0)
Eosinophils Absolute: 0.2 10*3/uL (ref 0.0–0.7)
Eosinophils Relative: 2.5 % (ref 0.0–5.0)
Lymphocytes Relative: 37.3 % (ref 12.0–46.0)
Monocytes Relative: 11.1 % (ref 3.0–12.0)
Neutrophils Relative %: 48.6 % (ref 43.0–77.0)
RBC: 3.96 Mil/uL (ref 3.87–5.11)
WBC: 6.4 10*3/uL (ref 4.5–10.5)

## 2011-01-13 LAB — BASIC METABOLIC PANEL
Calcium: 9.3 mg/dL (ref 8.4–10.5)
Creatinine, Ser: 0.8 mg/dL (ref 0.4–1.2)
GFR: 71.55 mL/min (ref >60.00)

## 2011-01-13 LAB — LIPID PANEL
HDL: 53.6 mg/dL (ref >39.00)
Total CHOL/HDL Ratio: 4
Triglycerides: 195 mg/dL — ABNORMAL HIGH (ref 0.0–149.0)

## 2011-01-13 LAB — TSH: TSH: 1.39 u[IU]/mL (ref 0.35–5.50)

## 2011-01-13 LAB — HEPATIC FUNCTION PANEL
ALT: 29 U/L (ref 0–35)
Alkaline Phosphatase: 58 U/L (ref 39–117)
Bilirubin, Direct: 0.1 mg/dL (ref 0.0–0.3)
Total Protein: 6.8 g/dL (ref 6.0–8.3)

## 2011-01-13 NOTE — Progress Notes (Signed)
Subjective:    Patient ID: Anna Gamble, female    DOB: 04-29-1935, 75 y.o.   MRN: [id number]  HPI Patient comes in today for preventive visit and follow-up of medical issues. Update of her history since her last visit. HT stable gets dry mouth and eyes asks about meds Obesity djd hard to lose weight no other change  AS stable no sx  DJD to have tkr  In January    GERD no change SOB had pulm consult felt to be from deconditioning and obesity    Hearing: no problems  Vision:  No limitations at present .  Safety:  Has smoke detector and wears seat belts.  No firearms. No excess sun exposure. Sees dentist regularly.  Falls: NON  Advance directive :  Reviewed  Has one.  Memory: Felt to be good  , no concern from her or her family.  Depression: No anhedonia unusual crying or depressive symptoms  Nutrition: Eats well balanced diet; adequate calcium and vitamin D. No swallowing chewiing problems.  Injury: no major injuries in the last six months.  Other healthcare providers:  Reviewed today .  Social:  Lives with husband married. And son invalid . Pet died recently   Preventive parameters: up-to-date on colonoscopy, mammogram, immunizations. Including Tdap and pneumovax.  ADLS:   There are no problems or need for assistance  driving, feeding, obtaining food, dressing, toileting and bathing, managing money using phone. She is independent.    Review of Systems ROS:  Dry mouth in am and dry eyes .  GEN/ HEENTNo fever, significant weight changes sweats headaches vision problems hearing changes, CV/ PULM; No chest pain cough, syncope,edema , change in exercise tolerance. GI /GU: No adominal pain, vomiting, change in bowel habits. No blood in the stool. No change in urgency  GU symptoms.Hx of polyps in bowel on 5 year recall / Dr Kinnie Scales  SKIN/HEME: ,no acute skin rashes suspicious lesions or bleeding. No lymphadenopathy, nodules, masses.  NEURO/ PSYCH:  No neurologic signs  such as weakness numbness No depression anxiety. IMM/ Allergy: No unusual infections.  No  REST of 12 system review negative  Past history family history social history reviewed in the electronic medical record.     Objective:   Physical Exam Physical Exam: Vital signs reviewed WUJ:WJXB is a well-developed well-nourished alert cooperative  white female who appears her stated age in no acute distress.  HEENT: normocephalic atraumatic , Eyes: PERRL EOM's full, conjunctiva clear, Nares: paten,t no deformity discharge or tenderness., Ears: no deformity EAC's clear TMs with normal landmarks. Mouth: clear OP, no lesions, edema.  Moist mucous membranes. Dentition in adequate repair. NECK: supple without masses, thyromegaly or bruits. CHEST/PULM:  Clear to auscultation and percussion breath sounds equal no wheeze , rales or rhonchi. No chest wall deformities or tenderness. CV: PMI is nondisplaced, S1 S2 no gallops, murmurs, rubs. Peripheral pulses are full without delay.No JVD .  ABDOMEN: Bowel sounds normal nontender  No guard or rebound, no hepato splenomegal no CVA tenderness.  No hernia. Extremtities:  No clubbing cyanosis or edema, no acute joint swelling or redness no focal atrophy NEURO:  Oriented x3, cranial nerves 3-12 appear to be intact, no obvious focal weakness,gait within normal limits no abnormal reflexes or asymmetrical SKIN: No acute rashes normal turgor, color, no bruising or petechiae. PSYCH: Oriented, good eye contact, no obvious depression anxiety, cognition and judgment appear normal. LN: no cervical axillary inguinal adenopathy      Assessment & Plan:  Wellness  Counseled regarding healthy nutrition, exercise, sleep, injury prevention, calcium vit d and healthy weight . Up to date  on healthcare parameters per hx   HT stable Obesity  Counseled.  GERD  No change DJD  scheduled for  TKR   No ci to surgery acceptable risk  Hyperglycemia  Check today  LIPIDS nose of  meds Stable   Sob  Prev eval Mild AS:   Sees cards yearly no sx and exam stable

## 2011-01-13 NOTE — Patient Instructions (Addendum)
Will notify you  of labs when available.  Ok  For surgery ROV depending on labs   And follow up. Weight loss will help your medical condition.

## 2011-01-15 ENCOUNTER — Encounter: Payer: Self-pay | Admitting: *Deleted

## 2011-01-15 ENCOUNTER — Encounter (HOSPITAL_COMMUNITY): Payer: Self-pay

## 2011-01-15 NOTE — Progress Notes (Signed)
Quick Note:  Letter mailed to pt ______ 

## 2011-01-20 ENCOUNTER — Telehealth: Payer: Self-pay | Admitting: Internal Medicine

## 2011-01-20 NOTE — Telephone Encounter (Signed)
Pt aware of results 

## 2011-01-20 NOTE — Telephone Encounter (Signed)
Pt is requesting bloodwork results 

## 2011-01-26 ENCOUNTER — Other Ambulatory Visit (HOSPITAL_COMMUNITY): Payer: Medicare Other

## 2011-01-28 ENCOUNTER — Other Ambulatory Visit: Payer: Self-pay

## 2011-01-28 ENCOUNTER — Encounter (HOSPITAL_COMMUNITY)
Admission: RE | Admit: 2011-01-28 | Discharge: 2011-01-28 | Disposition: A | Discharge status: Home / Self Care | Payer: Medicare Other | Source: Ambulatory Visit | Attending: Orthopedic Surgery | Admitting: Orthopedic Surgery

## 2011-01-28 ENCOUNTER — Other Ambulatory Visit (HOSPITAL_COMMUNITY): Payer: Self-pay | Admitting: *Deleted

## 2011-01-28 ENCOUNTER — Encounter (HOSPITAL_COMMUNITY): Payer: Self-pay

## 2011-01-28 HISTORY — DX: Atherosclerotic heart disease of native coronary artery without angina pectoris: I25.10

## 2011-01-28 HISTORY — DX: Nausea with vomiting, unspecified: R11.2

## 2011-01-28 HISTORY — DX: Other specified postprocedural states: Z98.890

## 2011-01-28 LAB — CBC
Hemoglobin: 13 g/dL (ref 12.0–15.0)
MCH: 32.5 pg (ref 26.0–34.0)
MCHC: 34.2 g/dL (ref 30.0–36.0)
MCV: 95 fL (ref 78.0–100.0)
Platelets: 257 10*3/uL (ref 150–400)

## 2011-01-28 LAB — URINALYSIS, ROUTINE W REFLEX MICROSCOPIC
Glucose, UA: NEGATIVE mg/dL
Ketones, ur: NEGATIVE mg/dL
Leukocytes, UA: NEGATIVE
Nitrite: NEGATIVE
Specific Gravity, Urine: 1.013 (ref 1.005–1.030)
pH: 6.5 (ref 5.0–8.0)

## 2011-01-28 LAB — SURGICAL PCR SCREEN
MRSA, PCR: NEGATIVE
Staphylococcus aureus: NEGATIVE

## 2011-01-28 LAB — DIFFERENTIAL
Basophils Relative: 0 % (ref 0–1)
Eosinophils Absolute: 0.2 10*3/uL (ref 0.0–0.7)
Eosinophils Relative: 3 % (ref 0–5)
Monocytes Relative: 10 % (ref 3–12)
Neutrophils Relative %: 45 % (ref 43–77)

## 2011-01-28 LAB — BASIC METABOLIC PANEL
BUN: 17 mg/dL (ref 6–23)
Calcium: 9.1 mg/dL (ref 8.4–10.5)
GFR calc non Af Amer: 67 mL/min — ABNORMAL LOW (ref >90)
Glucose, Bld: 111 mg/dL — ABNORMAL HIGH (ref 70–99)

## 2011-01-28 LAB — ABO/RH: ABO/RH(D): B POS

## 2011-01-28 LAB — PROTIME-INR: INR: 0.91 (ref 0.00–1.49)

## 2011-01-28 NOTE — H&P (Signed)
HISTORY OF PRESENT ILLNESS:  Anna Gamble is a very pleasant 76 year old female who presents to the office today for discussion of a predicament she has with her right knee, which has endstage arthritis.  She has been under the care of Dr. Lunette Stands for the last few years and has been on anti-inflammatory medicines, cortisone injections, and most recently finished up viscosupplementation. Unfortunately, she still has severe unremitting pain, right greater than left knees, and has endstage arthritis in both knees. The right knee has a valgus configuration and the left knee a varus configuration so she has a little bit of a windswept deformity.  She ambulates with a cane in her right hand and pain wakes her up at night and interferes with her ability to do gardening and chores and is definitely starting to affect her ability to take care of herself independently.  Fortunately, she has a husband at home who is in relatively good health.  She reports swelling with prolonged walking or standing.  Rest and ice seem to make it better as does Advil.  She does not like to take anything stronger than Advil and she is a relatively tough individual.  PAST MEDICAL HISTORY:  The patient has no known drug allergies.  She is not a diabetic.  She takes Crestor for elevated cholesterol as well as blood pressure medications.   She had some arthroscopic knee surgery in 2000 which did help her for an extended period of time.  SOCIAL HISTORY:  She does not smoke.  She does not drink.  She lives at home with her husband who is in relatively good health.  REVIEW OF SYSTEMS:   Review of systems reviewed thoroughly and all other systems are negative as related to the chief complaint except for glasses.  OBJECTIVE:  She is tender to palpation along the lateral joint line of the right knee, greater than the medial joint line, and along the medial joint line of the left knee greater than the lateral joint.  Both knees lack about 5  degrees of full extension.  She has a varus configuration to the left knee and valgus to the right knee.  She can flex to about 110 degrees to 115 degrees on the right and 130 degrees on the left.  RADIOGRAPHS:  Other reviewed images: Plain radiographs from November of 2012 are reviewed and do show bone on bone arthritic changes to the lateral compartment of the right knee and medial compartment of the left knee with peripheral osteophytes.  IMPRESSION:  Endstage arthritis right greater than left knee in an otherwise relatively healthy 76 year old woman with a BMI of approximately 40.  PLAN:  Options were discussed at length and models were brought into the room. She wants to proceed with a right total knee arthroplasty and is aware of the risks, benefits, and alternatives of surgery, which I think are reasonable considering her clinical and radiographic presentation. We will try and get this set up for her some time in January or February of 2013, and I think she will do well with the surgery.  Thank you to Dr. Charlett Blake for allowing me to participate in the care of this patient.

## 2011-01-28 NOTE — Pre-Procedure Instructions (Signed)
22 Anna Gamble  01/28/2011   Your procedure is scheduled on:02-01-2011 @12 :51 PM Report to [facility] at 10:50  AM.  Call this number if you have problems the morning of surgery: [phone]   Remember:   Do not eat food:After Midnight.  May have clear liquids: up to 4 Hours before arrival.  Clear liquids include soda, tea, black coffee, apple or grape juice, broth. Until 6:50 AM  Take these medicines the morning of surgery with A SIP OF WATER: metoprolol, zegerd,   Do not wear jewelry, make-up or nail polish.  Do not wear lotions, powders, or perfumes. You may wear deodorant.  Do not shave 48 hours prior to surgery.  Do not bring valuables to the hospital.  Contacts, dentures or bridgework may not be worn into surgery.  Leave suitcase in the car. After surgery it may be brought to your room.  For patients admitted to the hospital, checkout time is 11:00 AM the day of discharge.      Special Instructions: Incentive Spirometry - Practice and bring it with you on the day of surgery. and CHG Shower Use Special Wash: 1/2 bottle night before surgery and 1/2 bottle morning of surgery.   Please read over the following fact sheets that you were given: Pain Booklet, Blood Transfusion Information, MRSA Information and Surgical Site Infection Prevention

## 2011-01-31 DIAGNOSIS — M1711 Unilateral primary osteoarthritis, right knee: Secondary | ICD-10-CM | POA: Diagnosis present

## 2011-01-31 MED ORDER — DEXTROSE-NACL 5-0.45 % IV SOLN: INTRAVENOUS | Status: DC

## 2011-01-31 MED ORDER — CEFAZOLIN SODIUM 1-5 GM-% IV SOLN: 1.0000 g | INTRAVENOUS | Status: DC

## 2011-01-31 MED ORDER — CHLORHEXIDINE GLUCONATE 4 % EX LIQD: 60.0000 mL | Freq: Once | CUTANEOUS | Status: DC

## 2011-02-01 ENCOUNTER — Encounter (HOSPITAL_COMMUNITY): Payer: Self-pay | Admitting: Anesthesiology

## 2011-02-01 ENCOUNTER — Encounter (HOSPITAL_COMMUNITY): Payer: Self-pay | Admitting: *Deleted

## 2011-02-01 ENCOUNTER — Inpatient Hospital Stay (HOSPITAL_COMMUNITY)
Admission: RE | Admit: 2011-02-01 | Discharge: 2011-02-05 | DRG: 470 | Disposition: A | Discharge status: Home Health | Payer: Medicare Other | Source: Ambulatory Visit | Attending: Orthopedic Surgery | Admitting: Orthopedic Surgery

## 2011-02-01 ENCOUNTER — Ambulatory Visit (HOSPITAL_COMMUNITY): Payer: Medicare Other | Admitting: Anesthesiology

## 2011-02-01 ENCOUNTER — Encounter (HOSPITAL_COMMUNITY)
Admission: RE | Disposition: A | Discharge status: Home Health | Payer: Self-pay | Source: Ambulatory Visit | Attending: Orthopedic Surgery

## 2011-02-01 DIAGNOSIS — E669 Obesity, unspecified: Secondary | ICD-10-CM

## 2011-02-01 DIAGNOSIS — Z01818 Encounter for other preprocedural examination: Secondary | ICD-10-CM

## 2011-02-01 DIAGNOSIS — K219 Gastro-esophageal reflux disease without esophagitis: Secondary | ICD-10-CM

## 2011-02-01 DIAGNOSIS — E785 Hyperlipidemia, unspecified: Secondary | ICD-10-CM | POA: Diagnosis present

## 2011-02-01 DIAGNOSIS — M1710 Unilateral primary osteoarthritis, unspecified knee: Secondary | ICD-10-CM | POA: Diagnosis present

## 2011-02-01 DIAGNOSIS — I2510 Atherosclerotic heart disease of native coronary artery without angina pectoris: Secondary | ICD-10-CM | POA: Diagnosis present

## 2011-02-01 DIAGNOSIS — R7309 Other abnormal glucose: Secondary | ICD-10-CM

## 2011-02-01 DIAGNOSIS — IMO0002 Reserved for concepts with insufficient information to code with codable children: Secondary | ICD-10-CM | POA: Diagnosis not present

## 2011-02-01 DIAGNOSIS — Z01812 Encounter for preprocedural laboratory examination: Secondary | ICD-10-CM

## 2011-02-01 DIAGNOSIS — Z7901 Long term (current) use of anticoagulants: Secondary | ICD-10-CM

## 2011-02-01 DIAGNOSIS — Z79899 Other long term (current) drug therapy: Secondary | ICD-10-CM | POA: Diagnosis not present

## 2011-02-01 DIAGNOSIS — I359 Nonrheumatic aortic valve disorder, unspecified: Secondary | ICD-10-CM

## 2011-02-01 DIAGNOSIS — M1711 Unilateral primary osteoarthritis, right knee: Secondary | ICD-10-CM

## 2011-02-01 DIAGNOSIS — R0602 Shortness of breath: Secondary | ICD-10-CM

## 2011-02-01 DIAGNOSIS — G8918 Other acute postprocedural pain: Secondary | ICD-10-CM | POA: Diagnosis not present

## 2011-02-01 DIAGNOSIS — I10 Essential (primary) hypertension: Secondary | ICD-10-CM

## 2011-02-01 DIAGNOSIS — Z0000 Encounter for general adult medical examination without abnormal findings: Secondary | ICD-10-CM

## 2011-02-01 DIAGNOSIS — R7301 Impaired fasting glucose: Secondary | ICD-10-CM

## 2011-02-01 DIAGNOSIS — M159 Polyosteoarthritis, unspecified: Secondary | ICD-10-CM

## 2011-02-01 HISTORY — PX: TOTAL KNEE ARTHROPLASTY: SHX125

## 2011-02-01 SURGERY — ARTHROPLASTY, KNEE, TOTAL
Anesthesia: General | Site: Knee | Laterality: Right | Wound class: Clean

## 2011-02-01 MED ORDER — CEFAZOLIN SODIUM-DEXTROSE 2-3 GM-% IV SOLR
INTRAVENOUS | Status: AC
  Filled 2011-02-01: qty 50

## 2011-02-01 MED ORDER — DOCUSATE SODIUM 100 MG PO CAPS
100.0000 mg | ORAL_CAPSULE | Freq: Two times a day (BID) | ORAL | Status: DC
  Administered 2011-02-01 – 2011-02-05 (×8): 100 mg via ORAL
  Filled 2011-02-01 (×9): qty 1

## 2011-02-01 MED ORDER — MIDAZOLAM HCL 2 MG/2ML IJ SOLN
INTRAMUSCULAR | Status: AC
  Filled 2011-02-01: qty 2

## 2011-02-01 MED ORDER — ONDANSETRON HCL 4 MG/2ML IJ SOLN
INTRAMUSCULAR | Status: DC | PRN
  Administered 2011-02-01: 4 mg via INTRAVENOUS

## 2011-02-01 MED ORDER — METOCLOPRAMIDE HCL 5 MG/ML IJ SOLN
5.0000 mg | Freq: Three times a day (TID) | INTRAMUSCULAR | Status: DC | PRN
  Filled 2011-02-01: qty 2

## 2011-02-01 MED ORDER — ONDANSETRON HCL 4 MG/2ML IJ SOLN: 4.0000 mg | Freq: Once | INTRAMUSCULAR | Status: DC | PRN

## 2011-02-01 MED ORDER — PROPOFOL 10 MG/ML IV EMUL
INTRAVENOUS | Status: DC | PRN
  Administered 2011-02-01: 140 mg via INTRAVENOUS

## 2011-02-01 MED ORDER — LOSARTAN POTASSIUM 50 MG PO TABS
50.0000 mg | ORAL_TABLET | Freq: Every day | ORAL | Status: DC
  Administered 2011-02-02 – 2011-02-04 (×2): 50 mg via ORAL
  Filled 2011-02-01 (×5): qty 1

## 2011-02-01 MED ORDER — LACTATED RINGERS IV SOLN
INTRAVENOUS | Status: DC | PRN
  Administered 2011-02-01 (×4): via INTRAVENOUS

## 2011-02-01 MED ORDER — ROCURONIUM BROMIDE 100 MG/10ML IV SOLN
INTRAVENOUS | Status: DC | PRN
  Administered 2011-02-01: 50 mg via INTRAVENOUS

## 2011-02-01 MED ORDER — METHOCARBAMOL 500 MG PO TABS
500.0000 mg | ORAL_TABLET | Freq: Four times a day (QID) | ORAL | Status: DC | PRN
  Administered 2011-02-02 (×2): 500 mg via ORAL
  Filled 2011-02-01 (×2): qty 1

## 2011-02-01 MED ORDER — FENTANYL CITRATE 0.05 MG/ML IJ SOLN
50.0000 ug | INTRAMUSCULAR | Status: DC | PRN
  Administered 2011-02-01: 100 ug via INTRAVENOUS

## 2011-02-01 MED ORDER — HYDROMORPHONE HCL PF 1 MG/ML IJ SOLN
0.5000 mg | INTRAMUSCULAR | Status: DC | PRN
  Administered 2011-02-01: 1 mg via INTRAVENOUS
  Filled 2011-02-01 (×2): qty 1

## 2011-02-01 MED ORDER — DIPHENHYDRAMINE HCL 12.5 MG/5ML PO ELIX
12.5000 mg | ORAL_SOLUTION | ORAL | Status: DC | PRN
  Filled 2011-02-01: qty 10

## 2011-02-01 MED ORDER — ROSUVASTATIN CALCIUM 5 MG PO TABS
5.0000 mg | ORAL_TABLET | ORAL | Status: DC
  Administered 2011-02-03 – 2011-02-05 (×2): 5 mg via ORAL
  Filled 2011-02-01 (×3): qty 1

## 2011-02-01 MED ORDER — SODIUM CHLORIDE 0.9 % IR SOLN
Status: DC | PRN
  Administered 2011-02-01: 1000 mL

## 2011-02-01 MED ORDER — OXYCODONE-ACETAMINOPHEN 5-325 MG PO TABS
1.0000 | ORAL_TABLET | ORAL | Status: DC | PRN
  Administered 2011-02-02 – 2011-02-03 (×5): 2 via ORAL
  Administered 2011-02-03: 1 via ORAL
  Administered 2011-02-03: 2 via ORAL
  Administered 2011-02-04 – 2011-02-05 (×6): 1 via ORAL
  Filled 2011-02-01: qty 2
  Filled 2011-02-01: qty 1
  Filled 2011-02-01 (×3): qty 2
  Filled 2011-02-01: qty 1
  Filled 2011-02-01 (×2): qty 2
  Filled 2011-02-01: qty 1
  Filled 2011-02-01: qty 2
  Filled 2011-02-01 (×3): qty 1

## 2011-02-01 MED ORDER — HYDROMORPHONE HCL PF 1 MG/ML IJ SOLN
0.2500 mg | INTRAMUSCULAR | Status: DC | PRN
  Administered 2011-02-01 (×2): 0.5 mg via INTRAVENOUS

## 2011-02-01 MED ORDER — METOCLOPRAMIDE HCL 10 MG PO TABS
5.0000 mg | ORAL_TABLET | Freq: Three times a day (TID) | ORAL | Status: DC | PRN
  Administered 2011-02-01: 10 mg via ORAL
  Filled 2011-02-01: qty 2

## 2011-02-01 MED ORDER — ACETAMINOPHEN 10 MG/ML IV SOLN
INTRAVENOUS | Status: DC | PRN
  Administered 2011-02-01: 1000 mg via INTRAVENOUS

## 2011-02-01 MED ORDER — ONDANSETRON HCL 4 MG PO TABS: 4.0000 mg | ORAL_TABLET | Freq: Four times a day (QID) | ORAL | Status: DC | PRN

## 2011-02-01 MED ORDER — VITAMIN D3 25 MCG (1000 UNIT) PO TABS
1000.0000 [IU] | ORAL_TABLET | Freq: Every day | ORAL | Status: DC
  Administered 2011-02-02 – 2011-02-05 (×4): 1000 [IU] via ORAL
  Filled 2011-02-01 (×4): qty 1

## 2011-02-01 MED ORDER — ZOLPIDEM TARTRATE 5 MG PO TABS: 5.0000 mg | ORAL_TABLET | Freq: Every evening | ORAL | Status: DC | PRN

## 2011-02-01 MED ORDER — PHENOL 1.4 % MT LIQD
1.0000 | OROMUCOSAL | Status: DC | PRN
  Filled 2011-02-01: qty 177

## 2011-02-01 MED ORDER — CEFUROXIME SODIUM 1.5 G IJ SOLR
INTRAMUSCULAR | Status: DC | PRN
  Administered 2011-02-01: 1.5 g

## 2011-02-01 MED ORDER — WARFARIN VIDEO
Freq: Once | Status: AC
  Administered 2011-02-02: 12:00:00

## 2011-02-01 MED ORDER — NEOSTIGMINE METHYLSULFATE 1 MG/ML IJ SOLN
INTRAMUSCULAR | Status: DC | PRN
  Administered 2011-02-01: 4 mg via INTRAVENOUS

## 2011-02-01 MED ORDER — ACETAMINOPHEN 10 MG/ML IV SOLN
INTRAVENOUS | Status: AC
  Filled 2011-02-01: qty 100

## 2011-02-01 MED ORDER — SENNOSIDES-DOCUSATE SODIUM 8.6-50 MG PO TABS: 1.0000 | ORAL_TABLET | Freq: Every evening | ORAL | Status: DC | PRN

## 2011-02-01 MED ORDER — ACETAMINOPHEN 650 MG RE SUPP: 650.0000 mg | Freq: Four times a day (QID) | RECTAL | Status: DC | PRN

## 2011-02-01 MED ORDER — PHENYLEPHRINE HCL 10 MG/ML IJ SOLN
INTRAMUSCULAR | Status: DC | PRN
  Administered 2011-02-01: 80 ug via INTRAVENOUS

## 2011-02-01 MED ORDER — METOPROLOL SUCCINATE ER 25 MG PO TB24
25.0000 mg | ORAL_TABLET | Freq: Every day | ORAL | Status: DC
  Administered 2011-02-02 – 2011-02-05 (×4): 25 mg via ORAL
  Filled 2011-02-01 (×4): qty 1

## 2011-02-01 MED ORDER — CEFAZOLIN SODIUM-DEXTROSE 2-3 GM-% IV SOLR
2.0000 g | INTRAVENOUS | Status: AC
  Administered 2011-02-01: 2 g via INTRAVENOUS

## 2011-02-01 MED ORDER — HYDROCHLOROTHIAZIDE 12.5 MG PO CAPS
12.5000 mg | ORAL_CAPSULE | Freq: Every day | ORAL | Status: DC
  Administered 2011-02-02 – 2011-02-04 (×2): 12.5 mg via ORAL
  Filled 2011-02-01 (×4): qty 1

## 2011-02-01 MED ORDER — ONDANSETRON HCL 4 MG/2ML IJ SOLN
4.0000 mg | Freq: Four times a day (QID) | INTRAMUSCULAR | Status: DC | PRN
  Administered 2011-02-01 – 2011-02-02 (×3): 4 mg via INTRAVENOUS
  Filled 2011-02-01 (×3): qty 2

## 2011-02-01 MED ORDER — PANTOPRAZOLE SODIUM 40 MG PO TBEC
40.0000 mg | DELAYED_RELEASE_TABLET | Freq: Every day | ORAL | Status: DC
  Administered 2011-02-02 – 2011-02-05 (×3): 40 mg via ORAL
  Filled 2011-02-01 (×4): qty 1

## 2011-02-01 MED ORDER — GLYCOPYRROLATE 0.2 MG/ML IJ SOLN
INTRAMUSCULAR | Status: DC | PRN
  Administered 2011-02-01: .6 mg via INTRAVENOUS

## 2011-02-01 MED ORDER — FENTANYL CITRATE 0.05 MG/ML IJ SOLN
INTRAMUSCULAR | Status: AC
  Filled 2011-02-01: qty 2

## 2011-02-01 MED ORDER — BISACODYL 5 MG PO TBEC: 5.0000 mg | DELAYED_RELEASE_TABLET | Freq: Every day | ORAL | Status: DC | PRN

## 2011-02-01 MED ORDER — HYDROCODONE-ACETAMINOPHEN 5-325 MG PO TABS
1.0000 | ORAL_TABLET | ORAL | Status: DC | PRN
  Administered 2011-02-02 – 2011-02-03 (×2): 2 via ORAL
  Filled 2011-02-01 (×2): qty 2

## 2011-02-01 MED ORDER — ALUM & MAG HYDROXIDE-SIMETH 200-200-20 MG/5ML PO SUSP
30.0000 mL | ORAL | Status: DC | PRN
  Administered 2011-02-02 – 2011-02-04 (×2): 30 mL via ORAL
  Filled 2011-02-01 (×3): qty 30

## 2011-02-01 MED ORDER — WARFARIN SODIUM 5 MG PO TABS
5.0000 mg | ORAL_TABLET | Freq: Once | ORAL | Status: AC
  Administered 2011-02-01: 5 mg via ORAL
  Filled 2011-02-01: qty 1

## 2011-02-01 MED ORDER — METHOCARBAMOL 100 MG/ML IJ SOLN
500.0000 mg | Freq: Four times a day (QID) | INTRAVENOUS | Status: DC | PRN
  Filled 2011-02-01: qty 5

## 2011-02-01 MED ORDER — MENTHOL 3 MG MT LOZG: 1.0000 | LOZENGE | OROMUCOSAL | Status: DC | PRN

## 2011-02-01 MED ORDER — ACETAMINOPHEN 325 MG PO TABS: 650.0000 mg | ORAL_TABLET | Freq: Four times a day (QID) | ORAL | Status: DC | PRN

## 2011-02-01 MED ORDER — MIDAZOLAM HCL 2 MG/2ML IJ SOLN
1.0000 mg | INTRAMUSCULAR | Status: DC | PRN
  Administered 2011-02-01: 2 mg via INTRAVENOUS

## 2011-02-01 MED ORDER — COUMADIN BOOK
Freq: Once | Status: DC
  Filled 2011-02-01: qty 1

## 2011-02-01 MED ORDER — ENOXAPARIN SODIUM 30 MG/0.3ML ~~LOC~~ SOLN
30.0000 mg | Freq: Two times a day (BID) | SUBCUTANEOUS | Status: DC
  Administered 2011-02-02 – 2011-02-05 (×7): 30 mg via SUBCUTANEOUS
  Filled 2011-02-01 (×9): qty 0.3

## 2011-02-01 MED ORDER — FENTANYL CITRATE 0.05 MG/ML IJ SOLN
INTRAMUSCULAR | Status: DC | PRN
Start: 2011-02-01 — End: 2011-02-01
  Administered 2011-02-01: 100 ug via INTRAVENOUS
  Administered 2011-02-01 (×3): 50 ug via INTRAVENOUS

## 2011-02-01 MED ORDER — ACETAMINOPHEN 10 MG/ML IV SOLN: 1000.0000 mg | Freq: Once | INTRAVENOUS | Status: DC | PRN

## 2011-02-01 MED ORDER — KCL IN DEXTROSE-NACL 20-5-0.45 MEQ/L-%-% IV SOLN
INTRAVENOUS | Status: DC
  Administered 2011-02-01: 21:00:00 via INTRAVENOUS
  Filled 2011-02-01 (×14): qty 1000

## 2011-02-01 SURGICAL SUPPLY — 54 items
BANDAGE ELASTIC 6 VELCRO ST LF (GAUZE/BANDAGES/DRESSINGS) ×2 IMPLANT
BANDAGE ESMARK 6X9 LF (GAUZE/BANDAGES/DRESSINGS) ×1 IMPLANT
BLADE SAG 18X100X1.27 (BLADE) ×2 IMPLANT
BLADE SAW SGTL 13X75X1.27 (BLADE) ×2 IMPLANT
BLADE SURG ROTATE 9660 (MISCELLANEOUS) IMPLANT
BNDG ELASTIC 6X10 VLCR STRL LF (GAUZE/BANDAGES/DRESSINGS) ×2 IMPLANT
BNDG ESMARK 6X9 LF (GAUZE/BANDAGES/DRESSINGS) ×2
BOWL SMART MIX CTS (DISPOSABLE) ×2 IMPLANT
CEMENT HV SMART SET (Cement) ×4 IMPLANT
CLOTH BEACON ORANGE TIMEOUT ST (SAFETY) ×2 IMPLANT
COVER BACK TABLE 24X17X13 BIG (DRAPES) IMPLANT
COVER SURGICAL LIGHT HANDLE (MISCELLANEOUS) ×2 IMPLANT
CUFF TOURNIQUET SINGLE 34IN LL (TOURNIQUET CUFF) IMPLANT
CUFF TOURNIQUET SINGLE 44IN (TOURNIQUET CUFF) IMPLANT
DRAPE EXTREMITY T 121X128X90 (DRAPE) ×2 IMPLANT
DRAPE U-SHAPE 47X51 STRL (DRAPES) ×2 IMPLANT
DURAPREP 26ML APPLICATOR (WOUND CARE) ×2 IMPLANT
ELECT REM PT RETURN 9FT ADLT (ELECTROSURGICAL) ×2
ELECTRODE REM PT RTRN 9FT ADLT (ELECTROSURGICAL) ×1 IMPLANT
EVACUATOR 1/8 PVC DRAIN (DRAIN) ×2 IMPLANT
GAUZE XEROFORM 1X8 LF (GAUZE/BANDAGES/DRESSINGS) ×2 IMPLANT
GLOVE BIO SURGEON STRL SZ7 (GLOVE) ×2 IMPLANT
GLOVE BIO SURGEON STRL SZ7.5 (GLOVE) ×2 IMPLANT
GLOVE BIOGEL PI IND STRL 7.0 (GLOVE) ×1 IMPLANT
GLOVE BIOGEL PI IND STRL 8 (GLOVE) ×1 IMPLANT
GLOVE BIOGEL PI INDICATOR 7.0 (GLOVE) ×1
GLOVE BIOGEL PI INDICATOR 8 (GLOVE) ×1
GOWN PREVENTION PLUS XLARGE (GOWN DISPOSABLE) ×2 IMPLANT
GOWN STRL NON-REIN LRG LVL3 (GOWN DISPOSABLE) ×4 IMPLANT
HANDPIECE INTERPULSE COAX TIP (DISPOSABLE) ×1
HOOD PEEL AWAY FACE SHEILD DIS (HOOD) ×6 IMPLANT
KIT BASIN OR (CUSTOM PROCEDURE TRAY) ×2 IMPLANT
KIT ROOM TURNOVER OR (KITS) ×2 IMPLANT
MANIFOLD NEPTUNE II (INSTRUMENTS) ×2 IMPLANT
NS IRRIG 1000ML POUR BTL (IV SOLUTION) ×2 IMPLANT
PACK TOTAL JOINT (CUSTOM PROCEDURE TRAY) ×2 IMPLANT
PAD ARMBOARD 7.5X6 YLW CONV (MISCELLANEOUS) ×4 IMPLANT
PADDING CAST COTTON 6X4 STRL (CAST SUPPLIES) ×2 IMPLANT
SET HNDPC FAN SPRY TIP SCT (DISPOSABLE) ×1 IMPLANT
SPONGE GAUZE 4X4 12PLY (GAUZE/BANDAGES/DRESSINGS) ×4 IMPLANT
SPONGE GAUZE 4X4 STERILE 39 (GAUZE/BANDAGES/DRESSINGS) ×2 IMPLANT
STAPLER VISISTAT 35W (STAPLE) ×2 IMPLANT
SUCTION FRAZIER TIP 10 FR DISP (SUCTIONS) ×2 IMPLANT
SURGIFLO TRUKIT (HEMOSTASIS) IMPLANT
SUT VIC AB 0 CTX 36 (SUTURE) ×1
SUT VIC AB 0 CTX36XBRD ANTBCTR (SUTURE) ×1 IMPLANT
SUT VIC AB 1 CTX 36 (SUTURE) ×1
SUT VIC AB 1 CTX36XBRD ANBCTR (SUTURE) ×1 IMPLANT
SUT VIC AB 2-0 CT1 27 (SUTURE) ×1
SUT VIC AB 2-0 CT1 TAPERPNT 27 (SUTURE) ×1 IMPLANT
TOWEL OR 17X24 6PK STRL BLUE (TOWEL DISPOSABLE) ×2 IMPLANT
TOWEL OR 17X26 10 PK STRL BLUE (TOWEL DISPOSABLE) ×2 IMPLANT
TRAY FOLEY CATH 14FR (SET/KITS/TRAYS/PACK) IMPLANT
WATER STERILE IRR 1000ML POUR (IV SOLUTION) ×6 IMPLANT

## 2011-02-01 NOTE — Progress Notes (Signed)
ANTICOAGULATION CONSULT NOTE - Initial Consult  Pharmacy Consult for Coumadin Indication: VTE prophylaxis  Allergies  Allergen Reactions  . Lisinopril     REACTION: Cough  . Statins     REACTION: muscle aches with some statins    Patient Measurements: Height: 5\' 4"  (162.6 cm) Weight: 262 lb (118.842 kg) IBW/kg (Calculated) : 54.7 kg  Vital Signs: Temp: 97 F (36.1 C) (01/14 1600) Temp src: Oral (01/14 0838) BP: 132/49 mmHg (01/14 1600) Pulse Rate: 66  (01/14 1600)  Labs: from 01/28/11  PT  12.4, INR 0.91, PTT 32, H/H 13/38, PLTC 257K, WBC 7.8K. LFTs wnl (01/13/11)                                                   Estimated Creatinine Clearance: 75.6 ml/min (by C-G formula based on Cr of 0.84).  Medical History: Past Medical History  Diagnosis Date  . Hypertension   . Hyperlipidemia   . Generalized osteoarthrosis, involving multiple sites   . Impaired fasting glucose   . Obesity   . GERD (gastroesophageal reflux disease)     dilitation  . Personal history of other diseases of digestive system   . Colon polyp   . Dyspnea   . PONV (postoperative nausea and vomiting)   . Coronary artery disease     aortic stenosis/mild per ECHO    Medications:  Prescriptions prior to admission  Medication Sig Dispense Refill  . aspirin 81 MG tablet Take 81 mg by mouth daily. 1 tablet daily      . CALCIUM PO Take 1 tablet by mouth daily.        . cholecalciferol (VITAMIN D) 1000 UNITS tablet Take 1,000 Units by mouth daily.        Marland Kitchen losartan-hydrochlorothiazide (HYZAAR) 50-12.5 MG per tablet Take 1 tablet by mouth daily. Take 1 daily. Pt needs to schedule a follow up appointment before next refill.       . metoprolol succinate (TOPROL-XL) 25 MG 24 hr tablet Take 25 mg by mouth daily. 1 tablet daily      . multivitamin (THERAGRAN) per tablet Take 1 tablet by mouth daily.       Marland Kitchen omeprazole-sodium bicarbonate (ZEGERID) 40-1100 MG per capsule Take 1 capsule by mouth daily. 1 capsule  daily      . rosuvastatin (CRESTOR) 10 MG tablet Take 5 mg by mouth every other day. Take 1/2 tablet every other day      . VITAMIN E PO Take 1 tablet by mouth daily.         Scheduled:    . ceFAZolin      . ceFAZolin (ANCEF) IV  2 g Intravenous 60 min Pre-Op  . cholecalciferol  1,000 Units Oral Daily  . docusate sodium  100 mg Oral BID  . enoxaparin  30 mg Subcutaneous Q12H  . hydrochlorothiazide  12.5 mg Oral Daily  . losartan  50 mg Oral Daily  . metoprolol succinate  25 mg Oral Daily  . pantoprazole  40 mg Oral Q1200  . rosuvastatin  5 mg Oral QODAY  . DISCONTD: ceFAZolin (ANCEF) IV  1 g Intravenous 60 min Pre-Op  . DISCONTD: chlorhexidine  60 mL Topical Once    Assessment: 76 y.o. Female s/p right TKA secondary to osteoarthritis. No history of prior bleeding complications or GI bleed. Starting  Coumadin for VTE prophylaxis. Also will be receiving Lovenox 30mg  SQ q12h until INR >/= 1.8 per Dr. Wadie Lessen order  Goal of Therapy:  INR = 2-3   Plan: Coumadin 5mg  po tonight x1.  Monitor daily INR.   Arman Filter 02/01/2011,5:31 PM

## 2011-02-01 NOTE — Transfer of Care (Signed)
Immediate Anesthesia Transfer of Care Note  Patient: Anna Gamble  Procedure(s) Performed:  TOTAL KNEE ARTHROPLASTY - DEPUY/SIGMA  Patient Location: PACU  Anesthesia Type: General  Level of Consciousness: awake, alert , oriented and sedated  Airway & Oxygen Therapy: Patient Spontanous Breathing and Patient connected to nasal cannula oxygen  Post-op Assessment: Report given to PACU RN, Post -op Vital signs reviewed and stable and Patient moving all extremities  Post vital signs: Reviewed and stable Filed Vitals:   02/01/11 1115  BP:   Pulse: 78  Temp:   Resp: 14    Complications: No apparent anesthesia complications

## 2011-02-01 NOTE — Anesthesia Procedure Notes (Signed)
Anesthesia Regional Block:  Popliteal block  Pre-Anesthetic Checklist: ,, timeout performed, Correct Patient, Correct Site, Correct Laterality, Correct Procedure, Correct Position, site marked, Risks and benefits discussed,  Surgical consent,  Pre-op evaluation,  At surgeon's request and post-op pain management  Laterality: Right  Prep: chloraprep       Needles:  Injection technique: Single-shot  Needle Type: Stimulator Needle - 80      Needle Gauge: 22 and 22 G    Additional Needles:  Procedures: nerve stimulator Popliteal block  Nerve Stimulator or Paresthesia:  Response: 0.4 mA,   Additional Responses:   Narrative:  Start time: 02/01/2011 11:00 AM End time: 02/01/2011 11:10 AM  Performed by: Personally   Additional Notes: 30 cc 0.5% bupivicaine injected without difficulty.

## 2011-02-01 NOTE — Op Note (Signed)
PATIENT ID:      Anna Gamble  MRN:     [id number] DOB/AGE:    Apr 13, 1935 / 76 y.o.       OPERATIVE REPORT    DATE OF PROCEDURE:  02/01/2011       PREOPERATIVE DIAGNOSIS:   OSTEOARTHRITIS RIGHT KNEE      Estimated Body mass index is 44.99 kg/(m^2) as calculated from the following:   Height as of 04/09/10: 5\' 3" (1.6 m).   Weight as of 04/09/10: 254 lb(115.214 kg).                                                        POSTOPERATIVE DIAGNOSIS:   osteoarthritis right knee                                                                       PROCEDURE:  Procedure(s): TOTAL KNEE ARTHROPLASTY Using Depuy Sigma RP implants #3R Femur, #4Tibia, 10mm sigma RP bearing, 35 Patella     SURGEON: Danique Hartsough J    ASSISTANT:   Shirl Harris PA-C   (Present and scrubbed throughout the case, critical for assistance with exposure, retraction, instrumentation, and closure.)         ANESTHESIA: GA combined with regional for post-op pain   DRAINS: (2) Hemovact drain(s) in the Rknee with  Suction Open and Urinary Catheter (Foley)   TOURNIQUET TIME:    COMPLICATIONS:  None     SPECIMENS: None   INDICATIONS FOR PROCEDURE: The patient has  OSTEOARTHRITIS RIGHT KNEE, varus deformities, XR shows bone on bone arthritis. Patient has failed all conservative measures including anti-inflammatory medicines, narcotics, attempts at  exercise and weight loss, cortisone injections and viscosupplementation.  Risks and benefits of surgery have been discussed, questions answered.   DESCRIPTION OF PROCEDURE: The patient identified by armband, received  right femoral nerve block and IV antibiotics, in the holding area at [facility]. Patient taken to the operating room, appropriate anesthetic  monitors were attached General endotracheal anesthesia induced with  the patient in supine position, Foley catheter was inserted. Tourniquet  applied high to the operative thigh. Lateral post and foot positioner    applied to the table, the lower extremity was then prepped and draped  in usual sterile fashion from the ankle to the tourniquet. Time-out procedure was performed. The limb was wrapped with an Esmarch bandage and the tourniquet inflated to 350 mmHg. We began the operation by making the anterior midline incision starting at  handbreadth above the patella going over the patella 1 cm medial to and  4 cm distal to the tibial tubercle. Small bleeders in the skin and the  subcutaneous tissue identified and cauterized. Transverse retinaculum was incised and reflected medially and a medial parapatellar arthrotomy was accomplished. the patella was everted and theprepatellar fat pad resected. The superficial medial collateral  ligament was then elevated from anterior to posterior along the proximal  flare of the tibia and anterior half of the menisci resected. The knee was hyperflexed exposing bone on bone arthritis. Peripheral and notch osteophytes as  well as the cruciate ligaments were then resected. We continued to  work our way around posteriorly along the proximal tibia, and externally  rotated the tibia subluxing it out from underneath the femur. A McHale  retractor was placed through the notch and a lateral Hohmann retractor  placed, and we then drilled through the proximal tibia in line with the  axis of the tibia followed by an intramedullary guide rod and 2-degree  posterior slope cutting guide. The tibial cutting guide was pinned into place  allowing resection of 7 mm of bone medially and about 7 mm of bone  laterally because of her varus deformity. Satisfied with the tibial resection, we then  entered the distal femur 2 mm anterior to the PCL origin with the  intramedullary guide rod and applied the distal femoral cutting guide  set at 11mm, with 5 degrees of valgus. This was pinned along the  epicondylar axis. At this point, the distal femoral cut was accomplished without difficulty. We then  sized for a #3R femoral component and pinned the guide in 3 degrees of external rotation.The chamfer cutting guide was pinned into place. The anterior, posterior, and chamfer cuts were accomplished without difficulty followed by  the Sigma RP box cutting guide and the box cut. We also removed posterior osteophytes from the posterior femoral condyles. At this  time, the knee was brought into full extension. We checked our  extension and flexion gaps and found them symmetric at 10mm.  The patella thickness measured at 23 mm. We felt a 35 patella would  fit. We set the cutting guide at 15 and removed the posterior 9.5-10 mm  of the patella sized for 35 button and drilled the lollipop. The knee  was then once again hyperflexed exposing the proximal tibia. We sized for a #4 tibial base plate, applied the smokestack and the conical reamer followed by the the Delta fin keel punch. We then hammered into place the Sigma RP trial femoral component, inserted a 10-mm trial bearing, trial patellar button, and took the knee through range of motion from 0-130 degrees. No thumb pressure was required for patellar  tracking. At this point, all trial components were removed, a double batch of DePuy HV cement with 1500 mg of Zinacef was mixed and applied to all bony metallic mating surfaces except for the posterior condyles of the femur itself. In order, we  hammered into place the tibial tray and removed excess cement, the femoral component and removed excess cement, a 10-mm Sigma RP bearing  was inserted, and the knee brought to full extension with compression.  The patellar button was clamped into place, and excess cement  removed. While the cement cured the wound was irrigated out with normal saline solution pulse lavage, and medium Hemovac drains were placed from an anterolateral  approach. Ligament stability and patellar tracking were checked and found to be excellent. The parapatellar arthrotomy was closed with    running #1 Vicryl suture. The subcutaneous tissue with 0 and 2-0 undyed  Vicryl suture, and the skin with skin staples. A dressing of Xeroform,  4 x 4, dressing sponges, Webril, and Ace wrap applied. The patient  awakened, extubated, and taken to recovery room without difficulty.   Gean Birchwood J 02/01/2011, 1:53 PM

## 2011-02-01 NOTE — Progress Notes (Signed)
Orthopedic Tech Progress Note Patient Details:  Anna Gamble 28-Jun-1935 [id number]   applied cpm @ 0-30 to right knee with trapeze bar with helper   Nikki Dom 02/01/2011, 3:52 PM

## 2011-02-01 NOTE — Anesthesia Postprocedure Evaluation (Signed)
  Anesthesia Post-op Note  Patient: [name]  Procedure(s) Performed:  TOTAL KNEE ARTHROPLASTY - DEPUY/SIGMA  Patient Location: PACU  Anesthesia Type: General  Level of Consciousness: awake, alert  and oriented  Airway and Oxygen Therapy: Patient Spontanous Breathing  Post-op Pain: mild  Post-op Assessment: Post-op Vital signs reviewed and Patient's Cardiovascular Status Stable  Post-op Vital Signs: stable  Complications: No apparent anesthesia complications

## 2011-02-01 NOTE — Anesthesia Preprocedure Evaluation (Addendum)
Anesthesia Evaluation  Patient identified by MRN, date of birth, ID band Patient awake    Reviewed: Allergy & Precautions, H&P , NPO status , Patient's Chart, lab work & pertinent test results, reviewed documented beta blocker date and time   Airway Mallampati: II TM Distance: >3 FB Neck ROM: Full    Dental  (+) Teeth Intact   Pulmonary  clear to auscultation        Cardiovascular Regular Normal    Neuro/Psych    GI/Hepatic   Endo/Other    Renal/GU      Musculoskeletal   Abdominal   Peds  Hematology   Anesthesia Other Findings   Reproductive/Obstetrics                           Anesthesia Physical Anesthesia Plan  ASA: III  Anesthesia Plan: General   Post-op Pain Management:    Induction: Intravenous  Airway Management Planned: Oral ETT  Additional Equipment:   Intra-op Plan:   Post-operative Plan: Extubation in OR  Informed Consent: I have reviewed the patients History and Physical, chart, labs and discussed the procedure including the risks, benefits and alternatives for the proposed anesthesia with the patient or authorized representative who has indicated his/her understanding and acceptance.   Dental advisory given  Plan Discussed with: CRNA and Surgeon  Anesthesia Plan Comments: (Obesity Htn Borderline DM CBG 102 Mild Aortic Stenosis valve area 1.7 cm2 Mean gradient 13 mmhg EF 55-60 by echo 04/10/09.  Plan GA with FNB.  Kipp Brood, MD )      Anesthesia Quick Evaluation

## 2011-02-01 NOTE — Preoperative (Signed)
Beta Blockers   Reason not to administer Beta Blockers:Not Applicable 

## 2011-02-01 NOTE — Interval H&P Note (Signed)
History and Physical Interval Note:  02/01/2011 11:43 AM  Anna Gamble  has presented today for surgery, with the diagnosis of OSTEOARTHRITIS RIGHT KNEE  The various methods of treatment have been discussed with the patient and family. After consideration of risks, benefits and other options for treatment, the patient has consented to  Procedure(s): TOTAL KNEE ARTHROPLASTY as a surgical intervention .  The patients' history has been reviewed, patient examined, no change in status, stable for surgery.  I have reviewed the patients' chart and labs.  Questions were answered to the patient's satisfaction.     Nestor Lewandowsky

## 2011-02-02 ENCOUNTER — Encounter (HOSPITAL_COMMUNITY): Payer: Self-pay | Admitting: Orthopedic Surgery

## 2011-02-02 LAB — BASIC METABOLIC PANEL
Chloride: 101 mEq/L (ref 96–112)
Creatinine, Ser: 0.65 mg/dL (ref 0.50–1.10)
GFR calc Af Amer: >90 mL/min (ref >90)
Potassium: 4.2 mEq/L (ref 3.5–5.1)
Sodium: 136 mEq/L (ref 135–145)

## 2011-02-02 LAB — CBC
HCT: 32.4 % — ABNORMAL LOW (ref 36.0–46.0)
Hemoglobin: 10.9 g/dL — ABNORMAL LOW (ref 12.0–15.0)
MCH: 31.9 pg (ref 26.0–34.0)
MCHC: 33.6 g/dL (ref 30.0–36.0)
MCV: 94.7 fL (ref 78.0–100.0)
RDW: 13.2 % (ref 11.5–15.5)

## 2011-02-02 MED ORDER — WARFARIN SODIUM 5 MG PO TABS
5.0000 mg | ORAL_TABLET | Freq: Once | ORAL | Status: AC
  Administered 2011-02-02: 5 mg via ORAL
  Filled 2011-02-02: qty 1

## 2011-02-02 NOTE — Progress Notes (Signed)
Patient ID: Anna Gamble, female   DOB: 1935-01-20, 76 y.o.   MRN: [id number] PATIENT ID: Anna Gamble  MRN: [id number]  DOB/AGE:  [birth year] / 76 y.o.  1 Day Post-Op Procedure(s) (LRB): TOTAL KNEE ARTHROPLASTY (Right)    PROGRESS NOTE Subjective: Patient is alert, oriented, yes Nausea, 2x Vomiting last nite, yes passing gas, no Bowel Movement. Taking PO sips. Denies SOB, Chest or Calf Pain. Using Incentive Spirometer, PAS in place. Ambulate WBAT, CPM 0-30 Patient reports pain as 8 on 0-10 scale  .    Objective: Vital signs in last 24 hours: Filed Vitals:   02/01/11 1600 02/01/11 2202 02/02/11 0259 02/02/11 0558  BP: 132/49 138/83 116/79 136/81  Pulse: 66 77 81 90  Temp: 97 F (36.1 C) 97.2 F (36.2 C) 97.2 F (36.2 C) 98.7 F (37.1 C)  TempSrc:      Resp: 17 20 20 20   Height:      Weight:      SpO2: 95% 98% 100% 97%      Intake/Output from previous day: I/O last 3 completed shifts: In: 4220 [P.O.:320; I.V.:3800; Other:100] Out: 815 [Urine:650; Emesis/NG output:75; Drains:65; Blood:25]   Intake/Output this shift:     LABORATORY DATA:  Basename 02/01/11 1431 02/01/11 1054  WBC -- --  HGB -- --  HCT -- --  PLT -- --  NA -- --  K -- --  CL -- --  CO2 -- --  BUN -- --  CREATININE -- --  GLUCOSE -- --  GLUCAP 127* 102*  INR -- --  CALCIUM -- --    Examination: Neurologically intact ABD soft Neurovascular intact Sensation intact distally Intact pulses distally Dorsiflexion/Plantar flexion intact Incision: dressing C/D/I No cellulitis present Compartment soft} Blood and plasma separated in drain indicating minimal recent drainage, drain pulled without difficulty.  Assessment:   1 Day Post-Op Procedure(s) (LRB): TOTAL KNEE ARTHROPLASTY (Right) ADDITIONAL DIAGNOSIS:  none  Plan: PT/OT WBAT, CPM 5/hrs day until ROM 0-90 degrees, then D/C CPM DVT Prophylaxis:  Lovenox\Coumadin bridge target INR 1.5-2.0 DISCHARGE PLAN: Home DISCHARGE NEEDS: HHPT,  HHRN, CPM, Walker and 3-in-1 comode seat INR target 1.5 to 2.0, check labs when back    [name] J 02/02/2011, 7:25 AM

## 2011-02-02 NOTE — Progress Notes (Signed)
ANTICOAGULATION CONSULT NOTE - Follow up   Pharmacy Consult for Coumadin Indication: VTE prophylaxis  Allergies  Allergen Reactions  . Lisinopril     REACTION: Cough  . Statins     REACTION: muscle aches with some statins    Patient Measurements: Height: 5\' 4"  (162.6 cm) Weight: 262 lb (118.842 kg) IBW/kg (Calculated) : 54.7 kg  Vital Signs: Temp: 98.7 F (37.1 C) (01/15 0558) BP: 136/81 mmHg (01/15 0558) Pulse Rate: 90  (01/15 0558)  Labs:                                                   Estimated Creatinine Clearance: 79.4 ml/min (by C-G formula based on Cr of 0.65).  Medical History: Past Medical History  Diagnosis Date  . Hypertension   . Hyperlipidemia   . Generalized osteoarthrosis, involving multiple sites   . Impaired fasting glucose   . Obesity   . GERD (gastroesophageal reflux disease)     dilitation  . Personal history of other diseases of digestive system   . Colon polyp   . Dyspnea   . PONV (postoperative nausea and vomiting)   . Coronary artery disease     aortic stenosis/mild per ECHO    Medications:  Prescriptions prior to admission  Medication Sig Dispense Refill  . aspirin 81 MG tablet Take 81 mg by mouth daily. 1 tablet daily      . CALCIUM PO Take 1 tablet by mouth daily.        . cholecalciferol (VITAMIN D) 1000 UNITS tablet Take 1,000 Units by mouth daily.        Marland Kitchen losartan-hydrochlorothiazide (HYZAAR) 50-12.5 MG per tablet Take 1 tablet by mouth daily. Take 1 daily. Pt needs to schedule a follow up appointment before next refill.       . metoprolol succinate (TOPROL-XL) 25 MG 24 hr tablet Take 25 mg by mouth daily. 1 tablet daily      . multivitamin (THERAGRAN) per tablet Take 1 tablet by mouth daily.       Marland Kitchen omeprazole-sodium bicarbonate (ZEGERID) 40-1100 MG per capsule Take 1 capsule by mouth daily. 1 capsule daily      . rosuvastatin (CRESTOR) 10 MG tablet Take 5 mg by mouth every other day. Take 1/2 tablet every other day      .  VITAMIN E PO Take 1 tablet by mouth daily.         Scheduled:     . ceFAZolin      . cholecalciferol  1,000 Units Oral Daily  . coumadin book   Does not apply Once  . docusate sodium  100 mg Oral BID  . enoxaparin  30 mg Subcutaneous Q12H  . hydrochlorothiazide  12.5 mg Oral Daily  . losartan  50 mg Oral Daily  . metoprolol succinate  25 mg Oral Daily  . pantoprazole  40 mg Oral Q1200  . rosuvastatin  5 mg Oral QODAY  . warfarin  5 mg Oral Once  . warfarin   Does not apply Once  . DISCONTD: chlorhexidine  60 mL Topical Once    Assessment: 76 y.o. Female s/p right TKA secondary to osteoarthritis. POD # 1. INR= 1.08 today. H/H 10.9/32.4, pltc 168K. SCr 0.65.  Lovenox 30mg  SQ q12h until INR >/= 1.8   Goal of Therapy:  DVT Prophylaxis: Lovenox\Coumadin  bridge target INR 1.5-2.0 per Dr. Wadie Lessen progress note 02/02/11.   Plan: Coumadin 5mg  po tonight x1.  Monitor daily INR.   Arman Filter 02/02/2011,12:12 PM

## 2011-02-02 NOTE — Progress Notes (Signed)
UR COMPLETED  

## 2011-02-02 NOTE — Progress Notes (Signed)
Physical Therapy Evaluation Patient Details Name: Anna Gamble MRN: [id number] DOB: [birth year] Today's Date: 02/02/2011  Problem List:  Patient Active Problem List  Diagnoses  . HYPERLIPIDEMIA  . OBESITY  . HYPERTENSION  . AORTIC VALVE DISORDERS  . GERD  . GEN OSTEOARTHROSIS INVOLVING MULTIPLE SITES  . LOW BACK PAIN  . UNSPECIFIED OSTEOPOROSIS  . HEADACHE  . HEART MURMUR, SYSTOLIC  . SOB  . COUGH  . IMPAIRED FASTING GLUCOSE  . FASTING HYPERGLYCEMIA  . DIVERTICULITIS, HX OF  . FOOT PAIN, LEFT  . FATIGUE  . GERD (gastroesophageal reflux disease)  . Arthritis of knee, right    Past Medical History:  Past Medical History  Diagnosis Date  . Hypertension   . Hyperlipidemia   . Generalized osteoarthrosis, involving multiple sites   . Impaired fasting glucose   . Obesity   . GERD (gastroesophageal reflux disease)     dilitation  . Personal history of other diseases of digestive system   . Colon polyp   . Dyspnea   . PONV (postoperative nausea and vomiting)   . Coronary artery disease     aortic stenosis/mild per ECHO   Past Surgical History:  Past Surgical History  Procedure Date  . Appendectomy   . Cholecystectomy   . Knee surgery     rt  . Foot surgery     rt  . Tonsillectomy     PT Assessment/Plan/Recommendation PT Assessment Clinical Impression Statement: Pt s/p R TKR presenting with increased pain, decreased R LE strength and active R knee ROM. Patient to benefit from skilled PT to maximize R LE strength and ROM prior to d/c. PT Recommendation/Assessment: Patient will need skilled PT in the acute care venue PT Problem List: Decreased strength;Decreased range of motion;Decreased activity tolerance;Decreased balance;Decreased mobility;Obesity;Pain Barriers to Discharge: None PT Therapy Diagnosis : Difficulty walking;Abnormality of gait PT Plan PT Frequency: 7X/week PT Treatment/Interventions: Gait training;Therapeutic exercise;Therapeutic  activities;Functional mobility training PT Recommendation Follow Up Recommendations: Home health PT Equipment Recommended: Rolling walker with 5" wheels PT Goals  Acute Rehab PT Goals PT Goal Formulation: With patient Time For Goal Achievement: 7 days Pt will go Supine/Side to Sit: with supervision;with HOB 0 degrees PT Goal: Supine/Side to Sit - Progress: Progressing toward goal Pt will go Sit to Supine/Side: with supervision;with HOB 0 degrees PT Goal: Sit to Supine/Side - Progress: Progressing toward goal Pt will go Sit to Stand: with supervision PT Goal: Sit to Stand - Progress: Progressing toward goal Pt will Transfer Bed to Chair/Chair to Bed: with supervision PT Transfer Goal: Bed to Chair/Chair to Bed - Progress: Progressing toward goal Pt will Ambulate: >150 feet;with supervision;with rolling walker PT Goal: Ambulate - Progress: Progressing toward goal  PT Evaluation Precautions/Restrictions  Restrictions Weight Bearing Restrictions: Yes RLE Weight Bearing: Weight bearing as tolerated Prior Functioning  Home Living Lives With: Spouse Receives Help From: Family Type of Home: House Home Layout: One level Home Access: Ramped entrance Bathroom Shower/Tub: Health visitor: Handicapped height Bathroom Accessibility: Yes How Accessible: Accessible via walker Home Adaptive Equipment: Built-in shower seat (cane) Additional Comments:  (Patient has son who is in w/c who lives with her) Prior Function Level of Independence: Independent with basic ADLs (used straight cane PTA) Able to Take Stairs?: No Driving: Yes Vocation: Retired Producer, television/film/video: Awake/alert Overall Cognitive Status: Appears within functional limits for tasks assessed Orientation Level: Oriented X4 Cognition - Other Comments: increased anxiety re: mobility d/t pain  Pain: 8/10 Sensation/Coordination Sensation Light Touch:  Appears Intact Hot/Cold: Appears  Intact Proprioception: Appears Intact Extremity Assessment RUE Assessment RUE Assessment: Within Functional Limits LUE Assessment LUE Assessment: Within Functional Limits RLE Assessment RLE Assessment: Not tested LLE Assessment LLE Assessment: Within Functional Limits Mobility (including Balance) Bed Mobility Bed Mobility: Yes Supine to Sit: 3: Mod assist;With rails;HOB elevated (Comment degrees) (Pt=60% with max directional V/Cs) Transfers Transfers: Yes Sit to Stand: 3: Mod assist;With armrests;From bed Sit to Stand Details (indicate cue type and reason): Pt = 70% with max directional verbal cues Stand to Sit: 3: Mod assist Stand to Sit Details: max verbal cues due to uncontrolled descent Stand Pivot Transfers: 3: Mod assist;Patient percentage (comment) Stand Pivot Transfer Details (indicate cue type and reason): max directional v/cs for use of WW and sequencing. patient with increased anxiety re: pain. Ambulation/Gait Ambulation/Gait: Yes Ambulation/Gait Assistance: 3: Mod assist Ambulation/Gait Assistance Details (indicate cue type and reason): pt unable to tolerate increased amb distance Ambulation Distance (Feet):  (6 steps to chair) Assistive device: Rolling walker Gait Pattern: Decreased step length - right;Step-to pattern;Antalgic Gait velocity: decreased cadence Stairs: No  Posture/Postural Control Posture/Postural Control: No significant limitations Balance Balance Assessed:  (patient requires use of RW for safe standing balance and amb) Exercise  Total Joint Exercises Ankle Circles/Pumps: AROM;10 reps;Supine Quad Sets: AROM;Right;10 reps;Supine Heel Slides: AAROM;Right;10 reps;Supine End of Session PT - End of Session Equipment Utilized During Treatment: Gait belt Activity Tolerance: Patient tolerated treatment well (patient with increased anxiety) Patient left: in chair;with call bell in reach Nurse Communication: Mobility status for  transfers General Behavior During Session: [facility] for tasks performed Cognition: [facility] for tasks performed  Marcene Brawn 02/02/2011, 9:32 AM  Lewis Shock, PT, DPT Pager #: [phone] Office #: [phone]

## 2011-02-02 NOTE — Progress Notes (Signed)
Physical Therapy Treatment Patient Details Name: Anna Gamble MRN: [id number] DOB: Oct 17, 1935 Today's Date: 02/02/2011  PT Assessment/Plan  PT - Assessment/Plan Comments on Treatment Session: Pt able to ambulate approx. 12' this afternoon.  Attempted to place pt on CPM at end of session, but CPM not working properly.  Ortho Tech notified.   PT Plan: Discharge plan remains appropriate PT Frequency: 7X/week Follow Up Recommendations: Home health PT Equipment Recommended: Rolling walker with 5" wheels PT Goals  Acute Rehab PT Goals PT Goal: Sit to Supine/Side - Progress: Progressing toward goal PT Goal: Sit to Stand - Progress: Progressing toward goal PT Transfer Goal: Bed to Chair/Chair to Bed - Progress: Progressing toward goal PT Goal: Ambulate - Progress: Progressing toward goal  PT Treatment Precautions/Restrictions  Precautions Precautions: Knee Restrictions Weight Bearing Restrictions: Yes RLE Weight Bearing: Weight bearing as tolerated Mobility (including Balance) Bed Mobility Sit to Supine: 3: Mod assist Sit to Supine - Details (indicate cue type and reason): (A) for bil LE's & to lower shoulders/trunk to supine in controlled manner.  Cues for sequencing, use of UE's, & technique.   Transfers Sit to Stand: 4: Min assist;From chair/3-in-1;With upper extremity assist Sit to Stand Details (indicate cue type and reason): (A) to achieve standing, anterior translation of trunk over BOS, balance, & safety.  Cues for hand placement & technique.   Stand to Sit: 4: Min assist;With upper extremity assist;With armrests;To bed;To chair/3-in-1 Stand to Sit Details: (A) to control descent, safety.  Cues for RLE positioning, hand placement, & use of UE's to control descent.   Stand Pivot Transfers: 4: Min assist Stand Pivot Transfer Details (indicate cue type and reason): (A) to manage RW & balance.  Cues for sequencing & technique.   Ambulation/Gait Ambulation/Gait Assistance: 4: Min  assist;3: Mod assist Ambulation/Gait Assistance Details (indicate cue type and reason): (A) for balance, lateral wt shifting to advance R<>L foot, RW management.  Cues for safet, sequencing, advancement of RW, lateral wt shifting to left in order to advance RLE.  Required increased cues & (A) towards end of distance.  Distance limited due to pt c/o fatigue, increased weakness, & stating she felt like she was just going to collapse/   Ambulation Distance (Feet): 12 Feet Assistive device: Rolling walker Gait Pattern: Step-to pattern;Decreased step length - left;Decreased stance time - right;Antalgic;Decreased hip/knee flexion - right;Decreased step length - right Stairs: No Wheelchair Mobility Wheelchair Mobility: No    Exercise  Total Joint Exercises Ankle Circles/Pumps: AROM;Both;10 reps;Supine Quad Sets: Strengthening;Both;10 reps;Supine Gluteal Sets: Strengthening;Both;10 reps;Supine Hip ABduction/ADduction: AAROM;Strengthening;Right;10 reps;Supine Straight Leg Raises: Strengthening;AAROM;Right;10 reps;Supine End of Session PT - End of Session Equipment Utilized During Treatment: Gait belt Activity Tolerance: Patient tolerated treatment well;Other (comment) (c/o feeling like she was going to collapse when ambulating. ) Patient left: in bed;with call bell in reach Nurse Communication: Mobility status for transfers;Mobility status for ambulation General Behavior During Session: [facility] for tasks performed Cognition: [facility] for tasks performed  Lara Mulch 02/02/2011, 3:03 PM [phone]

## 2011-02-03 LAB — CBC
MCH: 32.3 pg (ref 26.0–34.0)
Platelets: 185 10*3/uL (ref 150–400)
RBC: 2.97 MIL/uL — ABNORMAL LOW (ref 3.87–5.11)
WBC: 11.2 10*3/uL — ABNORMAL HIGH (ref 4.0–10.5)

## 2011-02-03 LAB — PROTIME-INR: Prothrombin Time: 17.6 seconds — ABNORMAL HIGH (ref 11.6–15.2)

## 2011-02-03 MED ORDER — WARFARIN SODIUM 2.5 MG PO TABS
2.5000 mg | ORAL_TABLET | Freq: Once | ORAL | Status: AC
  Administered 2011-02-03: 2.5 mg via ORAL
  Filled 2011-02-03: qty 1

## 2011-02-03 NOTE — Progress Notes (Signed)
ANTICOAGULATION CONSULT NOTE - Follow Up Consult  Pharmacy Consult for Coumadin Indication: VTE prophylaxis  Allergies  Allergen Reactions  . Lisinopril     REACTION: Cough  . Statins     REACTION: muscle aches with some statins    Patient Measurements: Height: 5\' 4"  (162.6 cm) Weight: 262 lb (118.842 kg) IBW/kg (Calculated) : 54.7    Vital Signs: Temp: 98.9 F (37.2 C) (01/16 0447) Temp src: Oral (01/16 0447) BP: 107/66 mmHg (01/16 0447) Pulse Rate: 98  (01/16 0447)  Labs:  Basename 02/03/11 0500 02/02/11 0943 02/02/11 0910  HGB 9.6* -- 10.9*  HCT 28.0* -- 32.4*  PLT 185 -- 168  APTT -- -- --  LABPROT 17.6* -- 14.2  INR 1.42 -- 1.08  HEPARINUNFRC -- -- --  CREATININE -- 0.65 --  CKTOTAL -- -- --  CKMB -- -- --  TROPONINI -- -- --   Estimated Creatinine Clearance: 79.4 ml/min (by C-G formula based on Cr of 0.65).   Medications:  Prescriptions prior to admission  Medication Sig Dispense Refill  . aspirin 81 MG tablet Take 81 mg by mouth daily. 1 tablet daily      . CALCIUM PO Take 1 tablet by mouth daily.        . cholecalciferol (VITAMIN D) 1000 UNITS tablet Take 1,000 Units by mouth daily.        Marland Kitchen losartan-hydrochlorothiazide (HYZAAR) 50-12.5 MG per tablet Take 1 tablet by mouth daily. Take 1 daily. Pt needs to schedule a follow up appointment before next refill.       . metoprolol succinate (TOPROL-XL) 25 MG 24 hr tablet Take 25 mg by mouth daily. 1 tablet daily      . multivitamin (THERAGRAN) per tablet Take 1 tablet by mouth daily.       Marland Kitchen omeprazole-sodium bicarbonate (ZEGERID) 40-1100 MG per capsule Take 1 capsule by mouth daily. 1 capsule daily      . rosuvastatin (CRESTOR) 10 MG tablet Take 5 mg by mouth every other day. Take 1/2 tablet every other day      . VITAMIN E PO Take 1 tablet by mouth daily.         Scheduled:    . cholecalciferol  1,000 Units Oral Daily  . coumadin book   Does not apply Once  . docusate sodium  100 mg Oral BID  .  enoxaparin  30 mg Subcutaneous Q12H  . hydrochlorothiazide  12.5 mg Oral Daily  . losartan  50 mg Oral Daily  . metoprolol succinate  25 mg Oral Daily  . pantoprazole  40 mg Oral Q1200  . rosuvastatin  5 mg Oral QODAY  . warfarin  5 mg Oral ONCE-1800  . warfarin   Does not apply Once    Assessment:  75 y.o. Female s/p right TKA secondary to osteoarthritis. POD #2.  Lovenox\Coumadin bridge target INR 1.5-2.0. H/H decreased. INR 1.42 today. No bleeding reported. Coumadin education completed today.  Goal of Therapy:  INR = 1.5-2.0   Plan:  Coumadin 2.5 mg today x1.  Monitor INR daily. Coumadin education done. [facility] home care to manage outpatient coumadin.   Arman Filter 02/03/2011,9:48 AM

## 2011-02-03 NOTE — Progress Notes (Signed)
Referred to this CSW today for ?SNF. Chart reviewed and have spoken with RNCM and Care Coordinator who indicate patient plans to d/c home with HH and DME. CSW to sign off- please contact us if SW needs arise. Ariea Rochin, MSW, LCSWA [phone]   

## 2011-02-03 NOTE — Progress Notes (Signed)
Patient ID: Anna Gamble, female   DOB: 02-20-1935, 76 y.o.   MRN: [id number] PATIENT ID: Anna Gamble  MRN: [id number]  DOB/AGE:  05/17/1935 / 76 y.o.  2 Days Post-Op Procedure(s) (LRB): TOTAL KNEE ARTHROPLASTY (Right)    PROGRESS NOTE Subjective: Patient is alert, oriented, no Nausea, no Vomiting, no passing gas, no Bowel Movement. Taking PO well. Denies SOB, Chest or Calf Pain. Using Incentive Spirometer, PAS in place. Ambulate in room, CPM 0-40 Patient reports pain as 5 on 0-10 scale  .    Objective: Vital signs in last 24 hours: Filed Vitals:   02/02/11 0558 02/02/11 1400 02/02/11 2145 02/03/11 0447  BP: 136/81 122/71 97/61 107/66  Pulse: 90 81 82 98  Temp: 98.7 F (37.1 C) 98.4 F (36.9 C) 97.4 F (36.3 C) 98.9 F (37.2 C)  TempSrc:   Oral Oral  Resp: 20 16 18 20   Height:      Weight:      SpO2: 97% 95% 91% 96%      Intake/Output from previous day: I/O last 3 completed shifts: In: 4940 [P.O.:1040; I.V.:3800; Other:100] Out: 1415 [Urine:1250; Emesis/NG output:75; Drains:65; Blood:25]   Intake/Output this shift:     LABORATORY DATA:  Basename 02/02/11 0943 02/02/11 0910 02/01/11 1431 02/01/11 1054  WBC -- 13.8* -- --  HGB -- 10.9* -- --  HCT -- 32.4* -- --  PLT -- 168 -- --  NA 136 -- -- --  K 4.2 -- -- --  CL 101 -- -- --  CO2 27 -- -- --  BUN 13 -- -- --  CREATININE 0.65 -- -- --  GLUCOSE 158* -- -- --  GLUCAP -- -- 127* 102*  INR -- 1.08 -- --  CALCIUM 8.5 -- -- --    Examination: Neurologically intact ABD soft Neurovascular intact Sensation intact distally Intact pulses distally Dorsiflexion/Plantar flexion intact Incision: dressing C/D/I No cellulitis present Compartment soft}  Assessment:   2 Days Post-Op Procedure(s) (LRB): TOTAL KNEE ARTHROPLASTY (Right) ADDITIONAL DIAGNOSIS:    Plan: PT/OT WBAT, CPM 5/hrs day until ROM 0-90 degrees, then D/C CPM DVT Prophylaxis:  Lovenox\Coumadin bridge target INR 1.5-2.0 DISCHARGE PLAN:  Home DISCHARGE NEEDS: HHPT, HHRN, CPM and Walker Will need 1-2 more days to pass PT, change dressing today    Liyah Higham J 02/03/2011, 6:06 AM

## 2011-02-03 NOTE — Progress Notes (Signed)
OT Cancellation Note  Treatment cancelled today due to patient's refusal to participate: First attempt pt. Working with P.T., second attempt pt. Eating lunch. Will re-attempt as time allows.  Cassandria Anger, OTR/L Pager: [phone] 02/03/2011 .

## 2011-02-03 NOTE — Progress Notes (Signed)
Physical Therapy Treatment Patient Details Name: Anna Gamble MRN: [id number] DOB: Dec 13, 1935 Today's Date: 02/03/2011  PT Assessment/Plan  PT - Assessment/Plan Comments on Treatment Session: Pt stating she feels extremely weak & really dizzy which did not resolve with time.  RN made aware.  BP monitored (90/61 supine, 92/65 sitting EOB, 125/91 sitting in recliner).  If pt does not make better progress she may need STR-SNF prior to d/c home.  Will continue to follow acutely & make adjustmentments to follow up  recommendations as appropriately.   Follow Up Recommendations: Home health PT Equipment Recommended: Rolling walker with 5" wheels PT Goals  Acute Rehab PT Goals PT Goal: Supine/Side to Sit - Progress: Not met PT Goal: Sit to Stand - Progress: Not met PT Transfer Goal: Bed to Chair/Chair to Bed - Progress: Not met  PT Treatment Precautions/Restrictions  Precautions Precautions: Knee Restrictions Weight Bearing Restrictions: Yes RLE Weight Bearing: Weight bearing as tolerated Mobility (including Balance) Bed Mobility Supine to Sit: 4: Min assist Supine to Sit Details (indicate cue type and reason): Cues for technique, use of UE's to increase ease of Transition.  (A) for RLE, to lift shoulders/trunk to sitting.   Transfers Sit to Stand: 4: Min assist;From bed;With upper extremity assist Sit to Stand Details (indicate cue type and reason): (A) to achieve standing, steady RW, balance, safety.  Cues for hand placement & safe technique.   Stand to Sit: 4: Min assist;With armrests;With upper extremity assist;To chair/3-in-1 Stand to Sit Details: (A) to control descent & safety.  Cues for hand placement & technique.   Stand Pivot Transfers: Other (comment) (Min Guard (A)) Print production planner Details (indicate cue type and reason): Guarding for safety due to pt c/o dizziness & weakness.   Ambulation/Gait Ambulation/Gait: No (Due to pt c/o dizziness) Stairs: No Wheelchair  Mobility Wheelchair Mobility: No    Exercise  Total Joint Exercises Ankle Circles/Pumps: AROM;Both;10 reps;Supine Quad Sets: AROM;Both;10 reps;Supine;Strengthening Gluteal Sets: AROM;Strengthening;Both;10 reps;Supine Hip ABduction/ADduction: Strengthening;AAROM;Right;10 reps;Supine Straight Leg Raises: AAROM;Strengthening;Right;10 reps;Supine End of Session PT - End of Session Equipment Utilized During Treatment: Gait belt Activity Tolerance: Other (comment) (session limited due to pt c/o dizziness) Patient left: in chair;with call bell in reach;with family/visitor present Nurse Communication: Mobility status for transfers;Mobility status for ambulation General Behavior During Session: [facility] for tasks performed Cognition: [facility] for tasks performed  Lara Mulch 02/03/2011, 3:38 PM [phone]

## 2011-02-04 LAB — PROTIME-INR: Prothrombin Time: 17.6 seconds — ABNORMAL HIGH (ref 11.6–15.2)

## 2011-02-04 LAB — CBC
MCH: 32 pg (ref 26.0–34.0)
Platelets: 193 10*3/uL (ref 150–400)
RBC: 2.94 MIL/uL — ABNORMAL LOW (ref 3.87–5.11)

## 2011-02-04 MED ORDER — WARFARIN SODIUM 5 MG PO TABS
5.0000 mg | ORAL_TABLET | Freq: Once | ORAL | Status: AC
  Administered 2011-02-04: 5 mg via ORAL
  Filled 2011-02-04: qty 1

## 2011-02-04 NOTE — Progress Notes (Signed)
Physical Therapy Treatment Patient Details Name: Anna Gamble MRN: [id number] DOB: 05/19/1935 Today's Date: 02/04/2011  PT Assessment/Plan  PT - Assessment/Plan Comments on Treatment Session: Patient with improved activity tolerance this date however remains to have generalized deconditioning, decreased R LE strength and knee ROM. Pt to benefit from skilled PT to maximize R LE functional recovery. PT Plan: Discharge plan remains appropriate Follow Up Recommendations: Home health PT Equipment Recommended: Rolling walker with 5" wheels PT Goals  Acute Rehab PT Goals PT Goal: Supine/Side to Sit - Progress: Progressing toward goal PT Goal: Sit to Supine/Side - Progress: Progressing toward goal PT Goal: Sit to Stand - Progress: Progressing toward goal PT Transfer Goal: Bed to Chair/Chair to Bed - Progress: Progressing toward goal PT Goal: Ambulate - Progress: Progressing toward goal  PT Treatment Precautions/Restrictions  Precautions Precautions: Knee Required Braces or Orthoses: No Restrictions Weight Bearing Restrictions: Yes RLE Weight Bearing: Weight bearing as tolerated  Pain: 7/10 Mobility (including Balance) Bed Mobility Bed Mobility:  (pt received sitting up in chair) Transfers Sit to Stand: 4: Min assist Sit to Stand Details (indicate cue type and reason): contact guard for walker safety and hand placement Stand to Sit: 4: Min assist Ambulation/Gait Ambulation/Gait: Yes Ambulation/Gait Assistance: 4: Min assist (contact guard assist) Ambulation/Gait Assistance Details (indicate cue type and reason): pt with positive SOB which patient reports to be normal Ambulation Distance (Feet): 75 Feet Assistive device: Rolling walker Gait Pattern: Step-to pattern;Decreased step length - right;Decreased stance time - right;Antalgic Gait velocity: decreased cadence, freq standing rest breaks Stairs: No (patient with no stairs at home)  Balance Balance Assessed:  (patient con't  to require RW for safe standing and ambulation) Exercise  Total Joint Exercises Ankle Circles/Pumps: AROM;Both;20 reps;Seated Quad Sets: Right;20 reps;Seated (with LE elevated) Heel Slides: AAROM;20 reps;Seated;Right End of Session PT - End of Session Equipment Utilized During Treatment: Gait belt Activity Tolerance:  (activity tolerance limited by decreased endurance and + SOB) Patient left: in chair;with call bell in reach Nurse Communication: Mobility status for ambulation General Behavior During Session: [facility] for tasks performed Cognition: [facility] for tasks performed  Marcene Brawn 02/04/2011, 10:36 AM  Lewis Shock, PT, DPT Pager #: [phone] Office #: [phone]

## 2011-02-04 NOTE — Progress Notes (Signed)
ANTICOAGULATION CONSULT NOTE - Follow Up Consult  Pharmacy Consult for Coumadin Indication: VTE prophylaxis  Allergies  Allergen Reactions  . Lisinopril     REACTION: Cough  . Statins     REACTION: muscle aches with some statins    Patient Measurements: Height: 5\' 4"  (162.6 cm) Weight: 262 lb (118.842 kg) IBW/kg (Calculated) : 54.7    Vital Signs: Temp: 98.5 F (36.9 C) (01/17 0655) BP: 162/58 mmHg (01/17 0822) Pulse Rate: 98  (01/17 0655)  Labs:  Basename 02/04/11 0615 02/03/11 0500 02/02/11 0943 02/02/11 0910  HGB 9.4* 9.6* -- --  HCT 27.8* 28.0* -- 32.4*  PLT 193 185 -- 168  APTT -- -- -- --  LABPROT 17.6* 17.6* -- 14.2  INR 1.42 1.42 -- 1.08  HEPARINUNFRC -- -- -- --  CREATININE -- -- 0.65 --  CKTOTAL -- -- -- --  CKMB -- -- -- --  TROPONINI -- -- -- --   Estimated Creatinine Clearance: 79.4 ml/min (by C-G formula based on Cr of 0.65).   Medications:  Prescriptions prior to admission  Medication Sig Dispense Refill  . aspirin 81 MG tablet Take 81 mg by mouth daily. 1 tablet daily      . CALCIUM PO Take 1 tablet by mouth daily.        . cholecalciferol (VITAMIN D) 1000 UNITS tablet Take 1,000 Units by mouth daily.        Marland Kitchen losartan-hydrochlorothiazide (HYZAAR) 50-12.5 MG per tablet Take 1 tablet by mouth daily. Take 1 daily. Pt needs to schedule a follow up appointment before next refill.       . metoprolol succinate (TOPROL-XL) 25 MG 24 hr tablet Take 25 mg by mouth daily. 1 tablet daily      . multivitamin (THERAGRAN) per tablet Take 1 tablet by mouth daily.       Marland Kitchen omeprazole-sodium bicarbonate (ZEGERID) 40-1100 MG per capsule Take 1 capsule by mouth daily. 1 capsule daily      . rosuvastatin (CRESTOR) 10 MG tablet Take 5 mg by mouth every other day. Take 1/2 tablet every other day      . VITAMIN E PO Take 1 tablet by mouth daily.         Scheduled:     . cholecalciferol  1,000 Units Oral Daily  . coumadin book   Does not apply Once  . docusate  sodium  100 mg Oral BID  . enoxaparin  30 mg Subcutaneous Q12H  . hydrochlorothiazide  12.5 mg Oral Daily  . losartan  50 mg Oral Daily  . metoprolol succinate  25 mg Oral Daily  . pantoprazole  40 mg Oral Q1200  . rosuvastatin  5 mg Oral QODAY  . warfarin  2.5 mg Oral ONCE-1800   Assessment:   76 y.o. Female s/p right TKA secondary to osteoarthritis. POD #3.  Lovenox\Coumadin bridge target INR 1.5-2.0, per Dr. Turner Daniels. H/H decreased from baseline, no bleeding noted by ortho.  INR 1.42 today, same as yesterday following a dose reduction.   Goal of Therapy:  INR = 1.5-2.0   Plan:   1. Coumadin 5 mg po today x1.   2. Monitor INR daily. 3. Lovenox per MD until INR >=1.8  4. Coumadin education done 02/03/11 5. [facility] home care to manage outpatient coumadin.   Radonna Bracher, Maryagnes Amos 02/04/2011,2:28 PM

## 2011-02-04 NOTE — Progress Notes (Signed)
Patient ID: Anna Gamble, female   DOB: [birth year], 76 y.o.   MRN: [id number] PATIENT ID: Anna Gamble  MRN: [id number]  DOB/AGE:  [birth year] / 76 y.o.  3 Days Post-Op Procedure(s) (LRB): TOTAL KNEE ARTHROPLASTY (Right)    PROGRESS NOTE Subjective: Patient is alert, oriented, no Nausea, no Vomiting, yes passing gas, no Bowel Movement. Taking PO well. Denies SOB, Chest or Calf Pain. Using Incentive Spirometer, PAS in place. Ambulate in room only, felt diizy yesterday, up in chair, CPM 0-40 Patient reports pain as 4 on 0-10 scale using po meds only.    Objective: Vital signs in last 24 hours: Filed Vitals:   02/02/11 2145 02/03/11 0447 02/03/11 1400 02/03/11 2113  BP: 97/61 107/66 105/63 111/89  Pulse: 82 98 85 107  Temp: 97.4 F (36.3 C) 98.9 F (37.2 C) 98.8 F (37.1 C) 99.5 F (37.5 C)  TempSrc: Oral Oral  Oral  Resp: 18 20 16 18   Height:      Weight:      SpO2: 91% 96% 93% 94%      Intake/Output from previous day: I/O last 3 completed shifts: In: 720 [P.O.:720] Out: 351 [Urine:351]   Intake/Output this shift:     LABORATORY DATA:  Basename 02/04/11 0615 02/03/11 0500 02/02/11 0943 02/02/11 0910 02/01/11 1431 02/01/11 1054  WBC 10.5 11.2* -- -- -- --  HGB 9.4* 9.6* -- -- -- --  HCT 27.8* 28.0* -- -- -- --  PLT 193 185 -- -- -- --  NA -- -- 136 -- -- --  K -- -- 4.2 -- -- --  CL -- -- 101 -- -- --  CO2 -- -- 27 -- -- --  BUN -- -- 13 -- -- --  CREATININE -- -- 0.65 -- -- --  GLUCOSE -- -- 158* -- -- --  GLUCAP -- -- -- -- 127* 102*  INR -- 1.42 -- 1.08 -- --  CALCIUM -- -- 8.5 -- -- --    Examination: Neurologically intact ABD soft Neurovascular intact Sensation intact distally Intact pulses distally Dorsiflexion/Plantar flexion intact Incision: dressing C/D/I No cellulitis present Compartment soft}  Assessment:   3 Days Post-Op Procedure(s) (LRB): TOTAL KNEE ARTHROPLASTY (Right) ADDITIONAL DIAGNOSIS:    Plan: PT/OT WBAT, CPM 5/hrs day  until ROM 0-90 degrees, then D/C CPM DVT Prophylaxis:  Lovenox\Coumadin bridge target INR 1.5-2.0 DISCHARGE PLAN: Home will need SNF if no progress with PT today, pt will work hard as she wants to go home DISCHARGE NEEDS: HHPT, HHRN, CPM, Walker and 3-in-1 comode seat     Nazareth Kirk J 02/04/2011, 7:03 AM

## 2011-02-04 NOTE — Progress Notes (Signed)
Occupational Therapy Evaluation Patient Details Name: Anna Gamble MRN: [id number] DOB: Jul 22, 1935 Today's Date: 02/04/2011  Problem List:  Patient Active Problem List  Diagnoses  . HYPERLIPIDEMIA  . OBESITY  . HYPERTENSION  . AORTIC VALVE DISORDERS  . GERD  . GEN OSTEOARTHROSIS INVOLVING MULTIPLE SITES  . LOW BACK PAIN  . UNSPECIFIED OSTEOPOROSIS  . HEADACHE  . HEART MURMUR, SYSTOLIC  . SOB  . COUGH  . IMPAIRED FASTING GLUCOSE  . FASTING HYPERGLYCEMIA  . DIVERTICULITIS, HX OF  . FOOT PAIN, LEFT  . FATIGUE  . GERD (gastroesophageal reflux disease)  . Arthritis of knee, right    Past Medical History:  Past Medical History  Diagnosis Date  . Hypertension   . Hyperlipidemia   . Generalized osteoarthrosis, involving multiple sites   . Impaired fasting glucose   . Obesity   . GERD (gastroesophageal reflux disease)     dilitation  . Personal history of other diseases of digestive system   . Colon polyp   . Dyspnea   . PONV (postoperative nausea and vomiting)   . Coronary artery disease     aortic stenosis/mild per ECHO   Past Surgical History:  Past Surgical History  Procedure Date  . Appendectomy   . Cholecystectomy   . Knee surgery     rt  . Foot surgery     rt  . Tonsillectomy   . Total knee arthroplasty 02/01/2011    Procedure: TOTAL KNEE ARTHROPLASTY;  Surgeon: Nestor Lewandowsky;  Location: [facility] OR;  Service: Orthopedics;  Laterality: Right;  DEPUY/SIGMA    OT Assessment/Plan/Recommendation OT Assessment Clinical Impression Statement: Pt. presents s/p R TKR and with increased pain. Pt. will benefit from skilled OT to increase functional independence with ADLs to get pt. to supervision level at D/C OT Recommendation/Assessment: Patient will need skilled OT in the acute care venue OT Problem List: Decreased strength;Decreased activity tolerance;Impaired balance (sitting and/or standing);Decreased safety awareness;Decreased knowledge of use of DME or  AE;Decreased knowledge of precautions;Obesity;Cardiopulmonary status limiting activity;Pain Barriers to Discharge: None OT Therapy Diagnosis : Generalized weakness;Acute pain OT Plan OT Frequency: Min 2X/week OT Treatment/Interventions: Self-care/ADL training;Energy conservation;DME and/or AE instruction;Therapeutic activities;Patient/family education;Balance training OT Recommendation Follow Up Recommendations: Home health OT;Supervision - Intermittent Equipment Recommended: Rolling walker with 5" wheels Individuals Consulted: Husband Consulted and Agree with Results and Recommendations: Patient OT Goals Acute Rehab OT Goals OT Goal Formulation: With patient Time For Goal Achievement: 7 days ADL Goals Pt Will Perform Grooming: with set-up;with supervision;Standing at sink ADL Goal: Grooming - Progress: Goal set today Pt Will Perform Lower Body Bathing: with set-up;with supervision;Sit to stand from bed ADL Goal: Lower Body Bathing - Progress: Goal set today Pt Will Perform Lower Body Dressing: with set-up;with supervision;Sit to stand from bed;with adaptive equipment ADL Goal: Lower Body Dressing - Progress: Goal set today Pt Will Transfer to Toilet: with set-up;with supervision;Ambulation;with DME;3-in-1 ADL Goal: Toilet Transfer - Progress: Goal set today Pt Will Perform Toileting - Hygiene: with set-up;Sit to stand from 3-in-1/toilet ADL Goal: Toileting - Hygiene - Progress: Goal set today Pt Will Perform Tub/Shower Transfer: Shower transfer;with supervision;Ambulation;with DME;Shower seat with back ADL Goal: Web designer - Progress: Goal set today  OT Evaluation Precautions/Restrictions  Precautions Precautions: Knee Required Braces or Orthoses: No Restrictions Weight Bearing Restrictions: Yes RLE Weight Bearing: Weight bearing as tolerated Prior Functioning Home Living Lives With: Spouse Receives Help From: Family Type of Home: House Home Layout: One level Home  Access: Ramped entrance Bathroom Shower/Tub:  Walk-in shower Bathroom Toilet: Handicapped height Bathroom Accessibility: Yes How Accessible: Accessible via walker Home Adaptive Equipment: Built-in shower seat Prior Function Level of Independence: Independent with basic ADLs Able to Take Stairs?: No Driving: Yes Vocation: Retired ADL ADL Eating/Feeding: Performed;Independent Where Assessed - Eating/Feeding: Chair Grooming: Performed;Wash/dry hands;Set up;Minimal assistance Grooming Details (indicate cue type and reason): Min assist for balance Where Assessed - Grooming: Standing at sink Upper Body Bathing: Simulated;Chest;Right arm;Left arm;Abdomen;Minimal assistance Where Assessed - Upper Body Bathing: Sitting, chair Lower Body Bathing: Simulated;Set up (with long handled sponge) Where Assessed - Lower Body Bathing: Sit to stand from chair Upper Body Dressing: Performed;Minimal assistance Upper Body Dressing Details (indicate cue type and reason): with donning gown Where Assessed - Upper Body Dressing: Sitting, chair Lower Body Dressing: Performed;Minimal assistance Lower Body Dressing Details (indicate cue type and reason): with reacher and sock aid to don/doff socks and educated pt. on use of reacher for donning under garments and pants Where Assessed - Lower Body Dressing: Sitting, chair Toilet Transfer: Performed;Minimal assistance Toilet Transfer Details (indicate cue type and reason): Min verbal cues for hand placement and support of Rt. LE  Toilet Transfer Method: Proofreader: Bedside commode;Other (comment) (over toilet) Toileting - Clothing Manipulation: Minimal assistance;Performed Toileting - Clothing Manipulation Details (indicate cue type and reason): with moving gown Where Assessed - Toileting Clothing Manipulation: Standing Toileting - Hygiene: Performed;Set up Toileting - Hygiene Details (indicate cue type and reason): Min verbal cues for  standing to perform due to pt. unable to lift leg to complete hygiene Where Assessed - Toileting Hygiene: Standing Tub/Shower Transfer: Simulated;Minimal assistance Tub/Shower Transfer Details (indicate cue type and reason): Pt. provided with demonstration of posterior entrance into shower with use of RW to complete with stepping in with strong leg first and proper hand placement Tub/Shower Transfer Method: Ambulating Tub/Shower Transfer Equipment: Shower seat with back Equipment Used: Rolling walker;Sock aid;Reacher Ambulation Related to ADLs: Pt. min guard assist for ~10' with RW in room ADL Comments: Pt. with decreased activity tolerance and provided with mod verbal cues for energy conservation techniques to increase independence with ADLs. Pt. educated on shower transfer technique and use of AE for LB ADLs.     Extremity Assessment RUE Assessment RUE Assessment: Within Functional Limits LUE Assessment LUE Assessment: Within Functional Limits Mobility  Bed Mobility Bed Mobility: No Transfers Transfers: Yes Sit to Stand: 4: Min assist;From bed;With upper extremity assist Sit to Stand Details (indicate cue type and reason): Min verbal cues for hand placement and technique    End of Session OT - End of Session Equipment Utilized During Treatment: Gait belt Activity Tolerance: Patient tolerated treatment well Patient left: in chair;with call bell in reach Nurse Communication: Mobility status for transfers General Behavior During Session: [facility] for tasks performed Cognition: [facility] for tasks performed   Maddyx Vallie, OTR/L Pager [phone] 02/04/2011, 8:46 AM

## 2011-02-04 NOTE — Progress Notes (Signed)
Physical Therapy Treatment Patient Details Name: Anna Gamble MRN: [id number] DOB: [birth year] Today's Date: 02/04/2011  PT Assessment/Plan  PT - Assessment/Plan Comments on Treatment Session: Pt making slow progress with mobility.  Weakness, pain, decreased activity tolerance seem to be limiting factors in pt's progression.  Due to slow progress, pt would benefit from STR-SNF prior to d/c home to maximize strength, independence with functional mobility, safety.  Discussed with PT Fabio Neighbors who is in ageeance)   PT Plan: Discharge plan needs to be updated PT Frequency: 7X/week Follow Up Recommendations: Skilled nursing facility Equipment Recommended: Defer to next venue PT Goals  Acute Rehab PT Goals PT Goal: Sit to Stand - Progress: Not met PT Goal: Ambulate - Progress: Not met  PT Treatment Precautions/Restrictions  Precautions Precautions: Knee Required Braces or Orthoses: No Restrictions Weight Bearing Restrictions: Yes RLE Weight Bearing: Weight bearing as tolerated Mobility (including Balance) Bed Mobility Bed Mobility: No Transfers Sit to Stand: Other (comment);With armrests;From chair/3-in-1;With upper extremity assist (Min Guard (A)) Sit to Stand Details (indicate cue type and reason): Cues for safe hand placement & technique.  Stand to Sit: 4: Min assist Stand to Sit Details: (A) to control descent.  Cues for hand placement & RLE positioning to decrease discomfort with descent.   Ambulation/Gait Ambulation/Gait Assistance: 4: Min assist Ambulation/Gait Assistance Details (indicate cue type and reason): (A) for balance & safety.  Cues for safety, upright posture, pursed lip breathing, increased floor clearance with RLE (pt tends to slide foot on floor).  Pt with increased SOB, step-to pattern, decreased step height Rt. foot, fatigues quickly, increased trunk flexion with advancement of LLE, tends to lean/place forearms on front of RW when fatigued. Pt's husband  followed behind with Recliner.  Pt unable to ambulate back to room & was transported to room by recliner.   Ambulation Distance (Feet): 80 feet Assistive device: Rolling walker Gait Pattern: Step-to pattern;Antalgic;Decreased step length - right;Decreased step length - left;Decreased stance time - right;Trunk flexed Stairs: No Wheelchair Mobility Wheelchair Mobility: No    Exercise  Total Joint Exercises Ankle Circles/Pumps: AROM;10 reps;Both;Seated Quad Sets: AROM;Strengthening;Both;10 reps;Seated Gluteal Sets: AROM;Strengthening;Both;10 reps;Seated End of Session PT - End of Session Equipment Utilized During Treatment: Gait belt Activity Tolerance: Patient limited by fatigue;Patient limited by pain Patient left: in chair;with call bell in reach;with family/visitor present General Behavior During Session: [facility] for tasks performed Cognition: [facility] for tasks performed  Lara Mulch 02/04/2011, 3:04 PM [phone]

## 2011-02-05 LAB — PROTIME-INR: Prothrombin Time: 17.9 seconds — ABNORMAL HIGH (ref 11.6–15.2)

## 2011-02-05 MED ORDER — OXYCODONE-ACETAMINOPHEN 5-325 MG PO TABS: 1.0000 | ORAL_TABLET | ORAL | Status: AC | PRN

## 2011-02-05 MED ORDER — WARFARIN SODIUM 5 MG PO TABS
5.0000 mg | ORAL_TABLET | Freq: Once | ORAL | Status: AC
  Administered 2011-02-05: 5 mg via ORAL
  Filled 2011-02-05: qty 1

## 2011-02-05 MED ORDER — WARFARIN SODIUM 5 MG PO TABS: ORAL_TABLET | ORAL | Status: DC

## 2011-02-05 NOTE — Progress Notes (Signed)
Physical Therapy Treatment Patient Details Name: Anna Gamble MRN: [id number] DOB: 1935-05-18 Today's Date: 02/05/2011  PT Assessment/Plan  PT - Assessment/Plan Comments on Treatment Session: Patient with significant improvement in ambulation endurance. Patient with improved R LE strength and ROM. Patient able to tolerate up to 90 degrees of R knee flexion in CPM PT Plan: Discharge plan needs to be updated (Patient now demonstrates ability to safely return home with recommended DME, CPM and 24/7 supervision/assist. PT Frequency: 7X/week Follow Up Recommendations: Home health PT Equipment Recommended:  (rolling walker, CPM) PT Goals  Acute Rehab PT Goals PT Goal: Supine/Side to Sit - Progress: Progressing toward goal PT Goal: Sit to Supine/Side - Progress: Progressing toward goal PT Goal: Sit to Stand - Progress: Progressing toward goal PT Transfer Goal: Bed to Chair/Chair to Bed - Progress: Progressing toward goal PT Goal: Ambulate - Progress: Progressing toward goal  PT Treatment Precautions/Restrictions  Precautions Precautions: Knee Required Braces or Orthoses: No Restrictions Weight Bearing Restrictions: Yes RLE Weight Bearing: Weight bearing as tolerated Mobility (including Balance) Bed Mobility Bed Mobility: Yes Sit to Supine: 3: Mod assist Sit to Supine - Details (indicate cue type and reason): assist for R LE management, verbal cues to scoot to [facility] Transfers Sit to Stand: 4: Min assist Sit to Stand Details (indicate cue type and reason): contact guard assist,, increased time Ambulation/Gait Ambulation/Gait: Yes Ambulation/Gait Assistance: 4: Min assist (contact guard assist) Ambulation/Gait Assistance Details (indicate cue type and reason): patient with improved ambulation endurance Ambulation Distance (Feet): 150 Feet Assistive device: Rolling walker Gait Pattern: Step-to pattern;Decreased step length - right;Decreased stance time - right;Antalgic Gait velocity:  decreased cadence Stairs:  (patient doesn't have to negotiate stairs at home)    Exercise  Total Joint Exercises Ankle Circles/Pumps: AROM;Both;20 reps;Seated Quad Sets: AROM;Right;20 reps;Seated (with LEs elevated) Heel Slides: AAROM;20 reps;Seated Long Arc Quad: AROM;Right;20 reps;Seated End of Session PT - End of Session Equipment Utilized During Treatment: Gait belt Activity Tolerance: Patient tolerated treatment well Patient left: in CPM;in bed;with call bell in reach;with family/visitor present General Behavior During Session: [facility] for tasks performed Cognition: [facility] for tasks performed  Marcene Brawn 02/05/2011, 10:41 AM  Lewis Shock, PT, DPT Pager #: [phone] Office #: [phone]

## 2011-02-05 NOTE — Progress Notes (Signed)
CARE MANAGEMENT NOTE 02/05/2011 Discharge planning. Spoke with patient and Husband.Choice offered. preoperatively setup with [facility], no changes. CPM, and rolling walker to be delivered by TNT.

## 2011-02-05 NOTE — Discharge Summary (Signed)
Patient ID: Anna Gamble MRN: [id number] DOB/AGE: 1935/10/07 76 y.o.  Admit date: 02/01/2011 Discharge date: 02/05/2011  Admission Diagnoses:  Principal Problem:  *Arthritis of knee, right   Discharge Diagnoses:  Same  Past Medical History  Diagnosis Date  . Hypertension   . Hyperlipidemia   . Generalized osteoarthrosis, involving multiple sites   . Impaired fasting glucose   . Obesity   . GERD (gastroesophageal reflux disease)     dilitation  . Personal history of other diseases of digestive system   . Colon polyp   . Dyspnea   . PONV (postoperative nausea and vomiting)   . Coronary artery disease     aortic stenosis/mild per ECHO    Surgeries: Procedure(s): TOTAL KNEE ARTHROPLASTY on 02/01/2011   Consultants:    Discharged Condition: Improved  Hospital Course: Anna Gamble is an 76 y.o. female who was admitted 02/01/2011 for operative treatment ofArthritis of knee, right. Patient has severe unremitting pain that affects sleep, daily activities, and work/hobbies. After pre-op clearance the patient was taken to the operating room on 02/01/2011 and underwent  Procedure(s): TOTAL KNEE ARTHROPLASTY.    Patient was given perioperative antibiotics: Anti-infectives     Start     Dose/Rate Route Frequency Ordered Stop   02/01/11 1238   cefUROXime (ZINACEF) injection  Status:  Discontinued          As needed 02/01/11 1238 02/01/11 1421   02/01/11 0851   ceFAZolin (ANCEF) 2-3 GM-% IVPB SOLR     Comments: MOORE, TERRI: cabinet override         02/01/11 0851 02/01/11 2059   02/01/11 0840   ceFAZolin (ANCEF) IVPB 2 g/50 mL premix        2 g 100 mL/hr over 30 Minutes Intravenous 60 min pre-op 02/01/11 0840 02/01/11 1210   02/01/11 0000   ceFAZolin (ANCEF) IVPB 1 g/50 mL premix  Status:  Discontinued        1 g 100 mL/hr over 30 Minutes Intravenous 60 min pre-op 01/31/11 1025 02/01/11 0840           Patient was given sequential compression devices, early  ambulation, and chemoprophylaxis to prevent DVT.  Patient benefited maximally from hospital stay and there were no complications.    Recent vital signs: Patient Vitals for the past 24 hrs:  BP Temp Pulse Resp SpO2  02/05/11 1500 133/94 mmHg 98.6 F (37 C) 86  18  94 %  02/05/11 1056 111/70 mmHg - 85  - -  02/05/11 0630 118/58 mmHg 98.7 F (37.1 C) 93  18  97 %  2011-02-14 2155 110/48 mmHg 98.9 F (37.2 C) 85  16  97 %     Recent laboratory studies:  Basename 02/05/11 0505 February 14, 2011 0615 02/03/11 0500  WBC -- 10.5 11.2*  HGB -- 9.4* 9.6*  HCT -- 27.8* 28.0*  PLT -- 193 185  NA -- -- --  K -- -- --  CL -- -- --  CO2 -- -- --  BUN -- -- --  CREATININE -- -- --  GLUCOSE -- -- --  INR 1.45 1.42 --  CALCIUM -- -- --     Discharge Medications:  Current Discharge Medication List    START taking these medications   Details  oxyCODONE-acetaminophen (ROXICET) 5-325 MG per tablet Take 1 tablet by mouth every 4 (four) hours as needed for pain. Qty: 60 tablet, Refills: 0    warfarin (COUMADIN) 5 MG tablet Take as directed by HHN.  INR target 1.5-2.5. D/C after 2 weeks from date of surgery Qty: 30 tablet, Refills: 0      CONTINUE these medications which have NOT CHANGED   Details  CALCIUM PO Take 1 tablet by mouth daily.      cholecalciferol (VITAMIN D) 1000 UNITS tablet Take 1,000 Units by mouth daily.      losartan-hydrochlorothiazide (HYZAAR) 50-12.5 MG per tablet Take 1 tablet by mouth daily. Take 1 daily. Pt needs to schedule a follow up appointment before next refill.     metoprolol succinate (TOPROL-XL) 25 MG 24 hr tablet Take 25 mg by mouth daily. 1 tablet daily    multivitamin (THERAGRAN) per tablet Take 1 tablet by mouth daily.     omeprazole-sodium bicarbonate (ZEGERID) 40-1100 MG per capsule Take 1 capsule by mouth daily. 1 capsule daily    rosuvastatin (CRESTOR) 10 MG tablet Take 5 mg by mouth every other day. Take 1/2 tablet every other day   Associated  Diagnoses: Unspecified essential hypertension; Aortic valve disorders; Esophageal reflux; Impaired fasting glucose; Generalized osteoarthrosis, involving multiple sites; Other and unspecified hyperlipidemia; Obesity, unspecified; Other abnormal glucose; Shortness of breath; Medicare annual wellness visit, subsequent; Pre-op testing; GERD (gastroesophageal reflux disease)    VITAMIN E PO Take 1 tablet by mouth daily.        STOP taking these medications     aspirin 81 MG tablet         Diagnostic Studies: No results found.  Disposition: Home  Discharge Orders    Future Orders Please Complete By Expires   Diet - low sodium heart healthy      Call MD / Call 911      Comments:   If you experience chest pain or shortness of breath, CALL 911 and be transported to the hospital emergency room.  If you develope a fever above 101 F, pus (white drainage) or increased drainage or redness at the wound, or calf pain, call your surgeon's office.   Constipation Prevention      Comments:   Drink plenty of fluids.  Prune juice may be helpful.  You may use a stool softener, such as Colace (over the counter) 100 mg twice a day.  Use MiraLax (over the counter) for constipation as needed.   Increase activity slowly as tolerated      Weight Bearing as taught in Physical Therapy      Comments:   Use a walker or crutches as instructed.   Patient may shower      Comments:   You may shower without a dressing once there is no drainage.  Do not wash over the wound.  If drainage remains, cover wound with plastic wrap and then shower.   Driving restrictions      Comments:   No driving for 2 weeks   CPM      Comments:   Continuous passive motion machine (CPM):      Use the CPM from 0 to 60 for 5 hours per day.      You may increase by 10 degrees per day.  You may break it up into 2 or 3 sessions per day.      Use CPM for 2 weeks or until you are told to stop.   Change dressing      Comments:   Change  dressing on POD #5.  You may clean the incision with alcohol prior to redressing.   Do not put a pillow under the knee. Place it  under the heel.         Follow-up Information    Follow up with Nestor Lewandowsky, MD. Schedule an appointment as soon as possible for a visit in 10 days.   Contact information:   [facility] [address] [place] [address] [phone]           Signed: Nestor Lewandowsky 02/05/2011, 3:33 PM

## 2011-02-05 NOTE — Progress Notes (Signed)
Physical Therapy Treatment Patient Details Name: RHYDER BRATZ MRN: [id number] DOB: 10/17/1935 Today's Date: 02/04/2011  PT Assessment/Plan  PT - Assessment/Plan Comments on Treatment Session: Pt making slow progress with mobility.  Weakness, pain, decreased activity tolerance seem to be limiting factors in pt's progression.  Due to slow progress, pt would benefit from STR-SNF prior to d/c home to maximize strength, independence with functional mobility, safety.  Discussed with PT Fabio Neighbors who is in ageeance)   PT Plan: Discharge plan needs to be updated PT Frequency: 7X/week Follow Up Recommendations: Skilled nursing facility Equipment Recommended: Defer to next venue PT Goals  Acute Rehab PT Goals PT Goal: Sit to Stand - Progress: Not met PT Goal: Ambulate - Progress: Not met  PT Treatment Precautions/Restrictions  Precautions Precautions: Knee Required Braces or Orthoses: No Restrictions Weight Bearing Restrictions: Yes RLE Weight Bearing: Weight bearing as tolerated Mobility (including Balance) Bed Mobility Bed Mobility: No Transfers Sit to Stand: Other (comment);With armrests;From chair/3-in-1;With upper extremity assist (Min Guard (A)) Sit to Stand Details (indicate cue type and reason): Cues for safe hand placement & technique.  Stand to Sit: 4: Min assist Stand to Sit Details: (A) to control descent.  Cues for hand placement & RLE positioning to decrease discomfort with descent.   Ambulation/Gait Ambulation/Gait Assistance: 4: Min assist Ambulation/Gait Assistance Details (indicate cue type and reason): (A) for balance & safety.  Cues for safety, upright posture, pursed lip breathing, increased floor clearance with RLE (pt tends to slide foot on floor).  Pt with increased SOB, step-to pattern, decreased step height Rt. foot, fatigues quickly, increased trunk flexion with advancement of LLE, tends to lean/place forearms on front of RW when fatigued. Pt's husband  followed behind with Recliner.  Pt unable to ambulate back to room & was transported to room by recliner.   Ambulation Distance (Feet): 80 feet Assistive device: Rolling walker Gait Pattern: Step-to pattern;Antalgic;Decreased step length - right;Decreased step length - left;Decreased stance time - right;Trunk flexed Stairs: No Wheelchair Mobility Wheelchair Mobility: No    Exercise  Total Joint Exercises Ankle Circles/Pumps: AROM;10 reps;Both;Seated Quad Sets: AROM;Strengthening;Both;10 reps;Seated Gluteal Sets: AROM;Strengthening;Both;10 reps;Seated End of Session PT - End of Session Equipment Utilized During Treatment: Gait belt Activity Tolerance: Patient limited by fatigue;Patient limited by pain Patient left: in chair;with call bell in reach;with family/visitor present General Behavior During Session: [facility] for tasks performed Cognition: [facility] for tasks performed  Lara Mulch 02/04/2011, 3:04 PM [phone]  Lewis Shock, PT, DPT Pager #: [phone] Office #: [phone]

## 2011-02-05 NOTE — Progress Notes (Signed)
ANTICOAGULATION CONSULT NOTE - Follow Up Consult  Pharmacy Consult for Coumadin Indication: VTE prophylaxis  Allergies  Allergen Reactions  . Lisinopril     REACTION: Cough  . Statins     REACTION: muscle aches with some statins    Patient Measurements: Height: 5\' 4"  (162.6 cm) Weight: 262 lb (118.842 kg) IBW/kg (Calculated) : 54.7    Vital Signs: Temp: 98.7 F (37.1 C) (01/18 0630) BP: 118/58 mmHg (01/18 0630) Pulse Rate: 93  (01/18 0630)  Labs:  Basename 02/05/11 0505 02/04/11 0615 02/03/11 0500  HGB -- 9.4* 9.6*  HCT -- 27.8* 28.0*  PLT -- 193 185  APTT -- -- --  LABPROT 17.9* 17.6* 17.6*  INR 1.45 1.42 1.42  HEPARINUNFRC -- -- --  CREATININE -- -- --  CKTOTAL -- -- --  CKMB -- -- --  TROPONINI -- -- --   Estimated Creatinine Clearance: 79.4 ml/min (by C-G formula based on Cr of 0.65).   Medications:  Prescriptions prior to admission  Medication Sig Dispense Refill  . aspirin 81 MG tablet Take 81 mg by mouth daily. 1 tablet daily      . CALCIUM PO Take 1 tablet by mouth daily.        . cholecalciferol (VITAMIN D) 1000 UNITS tablet Take 1,000 Units by mouth daily.        Marland Kitchen losartan-hydrochlorothiazide (HYZAAR) 50-12.5 MG per tablet Take 1 tablet by mouth daily. Take 1 daily. Pt needs to schedule a follow up appointment before next refill.       . metoprolol succinate (TOPROL-XL) 25 MG 24 hr tablet Take 25 mg by mouth daily. 1 tablet daily      . multivitamin (THERAGRAN) per tablet Take 1 tablet by mouth daily.       Marland Kitchen omeprazole-sodium bicarbonate (ZEGERID) 40-1100 MG per capsule Take 1 capsule by mouth daily. 1 capsule daily      . rosuvastatin (CRESTOR) 10 MG tablet Take 5 mg by mouth every other day. Take 1/2 tablet every other day      . VITAMIN E PO Take 1 tablet by mouth daily.         Scheduled:     . cholecalciferol  1,000 Units Oral Daily  . coumadin book   Does not apply Once  . docusate sodium  100 mg Oral BID  . enoxaparin  30 mg  Subcutaneous Q12H  . hydrochlorothiazide  12.5 mg Oral Daily  . losartan  50 mg Oral Daily  . metoprolol succinate  25 mg Oral Daily  . pantoprazole  40 mg Oral Q1200  . rosuvastatin  5 mg Oral QODAY  . warfarin  5 mg Oral ONCE-1800   Assessment:   76 y.o. Female s/p R TKA POD #4.  Coumadin for DVT prophylaxis with a goal INR 1.5-2.0, per Dr. Turner Daniels, also on enoxaparin 30 mg subcutaneously q12hr until INR >=1.8, per MD.   H/H decreased from baseline on 1/17 CBC, no bleeding noted by ortho.  INR 1.45 today, slightly below goal, possibly due to lower dose given on 1/16.    Goal of Therapy:  INR = 1.5-2.0   Plan:   1. Coumadin 5 mg po today x1.   2. Monitor INR daily. 3. Lovenox per MD until INR >=1.8   Jiovanni Heeter, Maryagnes Amos 02/05/2011,10:14 AM

## 2011-02-05 NOTE — Progress Notes (Signed)
Physical Therapy Treatment Patient Details Name: Anna Gamble MRN: [id number] DOB: 1935/05/14 Today's Date: 02/05/2011  PT Assessment/Plan  PT - Assessment/Plan Comments on Treatment Session: Patient reports pain 5/10 in R LE.  Nursing administered meds before initiating treatment.  Husband present and observed and assisted with mobility.  Pt eager to d/c home.  Pt's RW delivered to room and adjusted to her height.  Pt and husband report no concerns or questions re:PT. PT Plan: Discharge plan remains appropriate PT Frequency: 7X/week Follow Up Recommendations: Home health PT Equipment Recommended: Rolling walker with 5" wheels PT Goals  Acute Rehab PT Goals PT Goal: Supine/Side to Sit - Progress: Progressing toward goal PT Goal: Sit to Supine/Side - Progress: Progressing toward goal PT Goal: Sit to Stand - Progress: Progressing toward goal PT Transfer Goal: Bed to Chair/Chair to Bed - Progress: Progressing toward goal PT Goal: Ambulate - Progress: Progressing toward goal  PT Treatment Precautions/Restrictions  Precautions Precautions: Knee Required Braces or Orthoses: No Restrictions Weight Bearing Restrictions: Yes RLE Weight Bearing: Weight bearing as tolerated Mobility (including Balance) Bed Mobility Bed Mobility: Yes Supine to Sit: 4: Min assist Supine to Sit Details (indicate cue type and reason): Assist for R LE.  Pt able to complete remainder of transition without other assist.  Cues for technique. Sit to Supine: 3: Mod assist Sit to Supine - Details (indicate cue type and reason): assist for R LE management, verbal cues to scoot to [facility] Transfers Transfers: Yes Sit to Stand: 4: Min assist;With upper extremity assist;With armrests;From bed;From chair/3-in-1 Sit to Stand Details (indicate cue type and reason): Cues for hand placement and technique. Stand to Sit: 4: Min assist;With upper extremity assist;With armrests;To chair/3-in-1 Stand to Sit Details: Cues for hand  placement and technique. Ambulation/Gait Ambulation/Gait: Yes Ambulation/Gait Assistance: 4: Min assist (Contact guard assist) Ambulation/Gait Assistance Details (indicate cue type and reason): Pt limited by continued SOB and fatique.  Step to gait pattern. Ambulation Distance (Feet): 125 Feet Assistive device: Rolling walker Gait Pattern: Step-to pattern;Trunk flexed;Antalgic Gait velocity: decreased cadence Stairs: No  Posture/Postural Control Posture/Postural Control: No significant limitations Exercise  Total Joint Exercises Ankle Circles/Pumps: AROM;10 reps;Both;Seated Quad Sets: AROM;10 reps;Right;Seated;Other (comment) (reclined in recliner) Heel Slides: Seated;AAROM;AROM;Right;10 reps Long Arc Quad: AROM;Right;20 reps;Seated End of Session PT - End of Session Equipment Utilized During Treatment: Gait belt Activity Tolerance: Patient limited by fatigue;Patient limited by pain Patient left: in chair;with call bell in reach;with family/visitor present General Behavior During Session: [facility] for tasks performed Cognition: [facility] for tasks performed  Newell Coral 02/05/2011, 1:59 PM  Newell Coral, PTA

## 2011-02-06 DIAGNOSIS — Z5181 Encounter for therapeutic drug level monitoring: Secondary | ICD-10-CM | POA: Diagnosis not present

## 2011-02-06 DIAGNOSIS — Z7901 Long term (current) use of anticoagulants: Secondary | ICD-10-CM | POA: Diagnosis not present

## 2011-02-06 DIAGNOSIS — Z96659 Presence of unspecified artificial knee joint: Secondary | ICD-10-CM | POA: Diagnosis not present

## 2011-02-06 DIAGNOSIS — I10 Essential (primary) hypertension: Secondary | ICD-10-CM | POA: Diagnosis not present

## 2011-02-06 DIAGNOSIS — Z471 Aftercare following joint replacement surgery: Secondary | ICD-10-CM | POA: Diagnosis not present

## 2011-02-08 DIAGNOSIS — Z5181 Encounter for therapeutic drug level monitoring: Secondary | ICD-10-CM | POA: Diagnosis not present

## 2011-02-08 DIAGNOSIS — Z471 Aftercare following joint replacement surgery: Secondary | ICD-10-CM | POA: Diagnosis not present

## 2011-02-08 DIAGNOSIS — Z96659 Presence of unspecified artificial knee joint: Secondary | ICD-10-CM | POA: Diagnosis not present

## 2011-02-08 DIAGNOSIS — Z7901 Long term (current) use of anticoagulants: Secondary | ICD-10-CM | POA: Diagnosis not present

## 2011-02-08 DIAGNOSIS — I10 Essential (primary) hypertension: Secondary | ICD-10-CM | POA: Diagnosis not present

## 2011-02-09 DIAGNOSIS — Z471 Aftercare following joint replacement surgery: Secondary | ICD-10-CM | POA: Diagnosis not present

## 2011-02-09 DIAGNOSIS — Z5181 Encounter for therapeutic drug level monitoring: Secondary | ICD-10-CM | POA: Diagnosis not present

## 2011-02-09 DIAGNOSIS — Z96659 Presence of unspecified artificial knee joint: Secondary | ICD-10-CM | POA: Diagnosis not present

## 2011-02-09 DIAGNOSIS — I10 Essential (primary) hypertension: Secondary | ICD-10-CM | POA: Diagnosis not present

## 2011-02-09 DIAGNOSIS — Z7901 Long term (current) use of anticoagulants: Secondary | ICD-10-CM | POA: Diagnosis not present

## 2011-02-10 DIAGNOSIS — I10 Essential (primary) hypertension: Secondary | ICD-10-CM | POA: Diagnosis not present

## 2011-02-10 DIAGNOSIS — Z7901 Long term (current) use of anticoagulants: Secondary | ICD-10-CM | POA: Diagnosis not present

## 2011-02-10 DIAGNOSIS — Z471 Aftercare following joint replacement surgery: Secondary | ICD-10-CM | POA: Diagnosis not present

## 2011-02-10 DIAGNOSIS — Z96659 Presence of unspecified artificial knee joint: Secondary | ICD-10-CM | POA: Diagnosis not present

## 2011-02-10 DIAGNOSIS — Z5181 Encounter for therapeutic drug level monitoring: Secondary | ICD-10-CM | POA: Diagnosis not present

## 2011-02-11 DIAGNOSIS — Z5181 Encounter for therapeutic drug level monitoring: Secondary | ICD-10-CM | POA: Diagnosis not present

## 2011-02-11 DIAGNOSIS — Z96659 Presence of unspecified artificial knee joint: Secondary | ICD-10-CM | POA: Diagnosis not present

## 2011-02-11 DIAGNOSIS — I10 Essential (primary) hypertension: Secondary | ICD-10-CM | POA: Diagnosis not present

## 2011-02-11 DIAGNOSIS — Z7901 Long term (current) use of anticoagulants: Secondary | ICD-10-CM | POA: Diagnosis not present

## 2011-02-11 DIAGNOSIS — Z471 Aftercare following joint replacement surgery: Secondary | ICD-10-CM | POA: Diagnosis not present

## 2011-02-12 DIAGNOSIS — I10 Essential (primary) hypertension: Secondary | ICD-10-CM | POA: Diagnosis not present

## 2011-02-12 DIAGNOSIS — Z96659 Presence of unspecified artificial knee joint: Secondary | ICD-10-CM | POA: Diagnosis not present

## 2011-02-12 DIAGNOSIS — Z7901 Long term (current) use of anticoagulants: Secondary | ICD-10-CM | POA: Diagnosis not present

## 2011-02-12 DIAGNOSIS — Z471 Aftercare following joint replacement surgery: Secondary | ICD-10-CM | POA: Diagnosis not present

## 2011-02-12 DIAGNOSIS — Z5181 Encounter for therapeutic drug level monitoring: Secondary | ICD-10-CM | POA: Diagnosis not present

## 2011-02-15 DIAGNOSIS — Z471 Aftercare following joint replacement surgery: Secondary | ICD-10-CM | POA: Diagnosis not present

## 2011-02-15 DIAGNOSIS — Z5181 Encounter for therapeutic drug level monitoring: Secondary | ICD-10-CM | POA: Diagnosis not present

## 2011-02-15 DIAGNOSIS — Z96659 Presence of unspecified artificial knee joint: Secondary | ICD-10-CM | POA: Diagnosis not present

## 2011-02-15 DIAGNOSIS — Z7901 Long term (current) use of anticoagulants: Secondary | ICD-10-CM | POA: Diagnosis not present

## 2011-02-15 DIAGNOSIS — I10 Essential (primary) hypertension: Secondary | ICD-10-CM | POA: Diagnosis not present

## 2011-02-16 DIAGNOSIS — Z96659 Presence of unspecified artificial knee joint: Secondary | ICD-10-CM | POA: Diagnosis not present

## 2011-02-16 DIAGNOSIS — Z7901 Long term (current) use of anticoagulants: Secondary | ICD-10-CM | POA: Diagnosis not present

## 2011-02-16 DIAGNOSIS — I10 Essential (primary) hypertension: Secondary | ICD-10-CM | POA: Diagnosis not present

## 2011-02-16 DIAGNOSIS — Z471 Aftercare following joint replacement surgery: Secondary | ICD-10-CM | POA: Diagnosis not present

## 2011-02-16 DIAGNOSIS — Z5181 Encounter for therapeutic drug level monitoring: Secondary | ICD-10-CM | POA: Diagnosis not present

## 2011-02-16 DIAGNOSIS — M1710 Unilateral primary osteoarthritis, unspecified knee: Secondary | ICD-10-CM | POA: Diagnosis not present

## 2011-02-17 DIAGNOSIS — I10 Essential (primary) hypertension: Secondary | ICD-10-CM | POA: Diagnosis not present

## 2011-02-17 DIAGNOSIS — Z471 Aftercare following joint replacement surgery: Secondary | ICD-10-CM | POA: Diagnosis not present

## 2011-02-17 DIAGNOSIS — Z7901 Long term (current) use of anticoagulants: Secondary | ICD-10-CM | POA: Diagnosis not present

## 2011-02-17 DIAGNOSIS — Z96659 Presence of unspecified artificial knee joint: Secondary | ICD-10-CM | POA: Diagnosis not present

## 2011-02-17 DIAGNOSIS — Z5181 Encounter for therapeutic drug level monitoring: Secondary | ICD-10-CM | POA: Diagnosis not present

## 2011-02-18 DIAGNOSIS — I10 Essential (primary) hypertension: Secondary | ICD-10-CM | POA: Diagnosis not present

## 2011-02-18 DIAGNOSIS — Z471 Aftercare following joint replacement surgery: Secondary | ICD-10-CM | POA: Diagnosis not present

## 2011-02-18 DIAGNOSIS — Z7901 Long term (current) use of anticoagulants: Secondary | ICD-10-CM | POA: Diagnosis not present

## 2011-02-18 DIAGNOSIS — Z5181 Encounter for therapeutic drug level monitoring: Secondary | ICD-10-CM | POA: Diagnosis not present

## 2011-02-18 DIAGNOSIS — Z96659 Presence of unspecified artificial knee joint: Secondary | ICD-10-CM | POA: Diagnosis not present

## 2011-02-19 DIAGNOSIS — Z7901 Long term (current) use of anticoagulants: Secondary | ICD-10-CM | POA: Diagnosis not present

## 2011-02-19 DIAGNOSIS — Z471 Aftercare following joint replacement surgery: Secondary | ICD-10-CM | POA: Diagnosis not present

## 2011-02-19 DIAGNOSIS — Z5181 Encounter for therapeutic drug level monitoring: Secondary | ICD-10-CM | POA: Diagnosis not present

## 2011-02-19 DIAGNOSIS — I10 Essential (primary) hypertension: Secondary | ICD-10-CM | POA: Diagnosis not present

## 2011-02-19 DIAGNOSIS — Z96659 Presence of unspecified artificial knee joint: Secondary | ICD-10-CM | POA: Diagnosis not present

## 2011-02-22 DIAGNOSIS — Z96659 Presence of unspecified artificial knee joint: Secondary | ICD-10-CM | POA: Diagnosis not present

## 2011-02-22 DIAGNOSIS — Z471 Aftercare following joint replacement surgery: Secondary | ICD-10-CM | POA: Diagnosis not present

## 2011-02-22 DIAGNOSIS — Z7901 Long term (current) use of anticoagulants: Secondary | ICD-10-CM | POA: Diagnosis not present

## 2011-02-22 DIAGNOSIS — I10 Essential (primary) hypertension: Secondary | ICD-10-CM | POA: Diagnosis not present

## 2011-02-22 DIAGNOSIS — Z5181 Encounter for therapeutic drug level monitoring: Secondary | ICD-10-CM | POA: Diagnosis not present

## 2011-02-24 DIAGNOSIS — Z471 Aftercare following joint replacement surgery: Secondary | ICD-10-CM | POA: Diagnosis not present

## 2011-02-24 DIAGNOSIS — I10 Essential (primary) hypertension: Secondary | ICD-10-CM | POA: Diagnosis not present

## 2011-02-24 DIAGNOSIS — Z7901 Long term (current) use of anticoagulants: Secondary | ICD-10-CM | POA: Diagnosis not present

## 2011-02-24 DIAGNOSIS — Z96659 Presence of unspecified artificial knee joint: Secondary | ICD-10-CM | POA: Diagnosis not present

## 2011-02-24 DIAGNOSIS — Z5181 Encounter for therapeutic drug level monitoring: Secondary | ICD-10-CM | POA: Diagnosis not present

## 2011-02-25 DIAGNOSIS — I10 Essential (primary) hypertension: Secondary | ICD-10-CM | POA: Diagnosis not present

## 2011-02-25 DIAGNOSIS — Z96659 Presence of unspecified artificial knee joint: Secondary | ICD-10-CM | POA: Diagnosis not present

## 2011-02-25 DIAGNOSIS — Z7901 Long term (current) use of anticoagulants: Secondary | ICD-10-CM | POA: Diagnosis not present

## 2011-02-25 DIAGNOSIS — Z471 Aftercare following joint replacement surgery: Secondary | ICD-10-CM | POA: Diagnosis not present

## 2011-02-26 ENCOUNTER — Other Ambulatory Visit: Payer: Self-pay | Admitting: Internal Medicine

## 2011-02-26 DIAGNOSIS — Z96659 Presence of unspecified artificial knee joint: Secondary | ICD-10-CM | POA: Diagnosis not present

## 2011-02-26 DIAGNOSIS — I10 Essential (primary) hypertension: Secondary | ICD-10-CM | POA: Diagnosis not present

## 2011-02-26 DIAGNOSIS — Z471 Aftercare following joint replacement surgery: Secondary | ICD-10-CM | POA: Diagnosis not present

## 2011-02-26 DIAGNOSIS — Z5181 Encounter for therapeutic drug level monitoring: Secondary | ICD-10-CM | POA: Diagnosis not present

## 2011-02-26 DIAGNOSIS — Z7901 Long term (current) use of anticoagulants: Secondary | ICD-10-CM | POA: Diagnosis not present

## 2011-02-26 MED ORDER — LOSARTAN POTASSIUM-HCTZ 50-12.5 MG PO TABS: ORAL_TABLET | ORAL | Status: DC

## 2011-02-26 NOTE — Telephone Encounter (Signed)
Pt need refill losartan-hctz #90 with 3 refills   fax to [facility] [phone].

## 2011-02-26 NOTE — Telephone Encounter (Signed)
rx sent to pharmacy

## 2011-03-01 DIAGNOSIS — M25569 Pain in unspecified knee: Secondary | ICD-10-CM | POA: Diagnosis not present

## 2011-03-01 DIAGNOSIS — Z96659 Presence of unspecified artificial knee joint: Secondary | ICD-10-CM | POA: Diagnosis not present

## 2011-03-03 DIAGNOSIS — Z96659 Presence of unspecified artificial knee joint: Secondary | ICD-10-CM | POA: Diagnosis not present

## 2011-03-03 DIAGNOSIS — M25569 Pain in unspecified knee: Secondary | ICD-10-CM | POA: Diagnosis not present

## 2011-03-08 DIAGNOSIS — M25569 Pain in unspecified knee: Secondary | ICD-10-CM | POA: Diagnosis not present

## 2011-03-08 DIAGNOSIS — Z96659 Presence of unspecified artificial knee joint: Secondary | ICD-10-CM | POA: Diagnosis not present

## 2011-03-10 DIAGNOSIS — Z96659 Presence of unspecified artificial knee joint: Secondary | ICD-10-CM | POA: Diagnosis not present

## 2011-03-10 DIAGNOSIS — M25569 Pain in unspecified knee: Secondary | ICD-10-CM | POA: Diagnosis not present

## 2011-03-15 DIAGNOSIS — Z96659 Presence of unspecified artificial knee joint: Secondary | ICD-10-CM | POA: Diagnosis not present

## 2011-03-15 DIAGNOSIS — M25569 Pain in unspecified knee: Secondary | ICD-10-CM | POA: Diagnosis not present

## 2011-03-16 DIAGNOSIS — M1710 Unilateral primary osteoarthritis, unspecified knee: Secondary | ICD-10-CM | POA: Diagnosis not present

## 2011-03-19 DIAGNOSIS — M25569 Pain in unspecified knee: Secondary | ICD-10-CM | POA: Diagnosis not present

## 2011-03-19 DIAGNOSIS — Z96659 Presence of unspecified artificial knee joint: Secondary | ICD-10-CM | POA: Diagnosis not present

## 2011-03-22 DIAGNOSIS — M25569 Pain in unspecified knee: Secondary | ICD-10-CM | POA: Diagnosis not present

## 2011-03-22 DIAGNOSIS — Z96659 Presence of unspecified artificial knee joint: Secondary | ICD-10-CM | POA: Diagnosis not present

## 2011-03-24 DIAGNOSIS — Z96659 Presence of unspecified artificial knee joint: Secondary | ICD-10-CM | POA: Diagnosis not present

## 2011-03-24 DIAGNOSIS — M25569 Pain in unspecified knee: Secondary | ICD-10-CM | POA: Diagnosis not present

## 2011-04-01 DIAGNOSIS — M25569 Pain in unspecified knee: Secondary | ICD-10-CM | POA: Diagnosis not present

## 2011-04-01 DIAGNOSIS — Z96659 Presence of unspecified artificial knee joint: Secondary | ICD-10-CM | POA: Diagnosis not present

## 2011-04-22 DIAGNOSIS — H02829 Cysts of unspecified eye, unspecified eyelid: Secondary | ICD-10-CM | POA: Diagnosis not present

## 2011-04-22 DIAGNOSIS — H16109 Unspecified superficial keratitis, unspecified eye: Secondary | ICD-10-CM | POA: Diagnosis not present

## 2011-04-22 DIAGNOSIS — H02839 Dermatochalasis of unspecified eye, unspecified eyelid: Secondary | ICD-10-CM | POA: Diagnosis not present

## 2011-04-22 DIAGNOSIS — H04129 Dry eye syndrome of unspecified lacrimal gland: Secondary | ICD-10-CM | POA: Diagnosis not present

## 2011-04-27 ENCOUNTER — Other Ambulatory Visit: Payer: Self-pay | Admitting: *Deleted

## 2011-04-27 MED ORDER — METOPROLOL SUCCINATE ER 25 MG PO TB24: 25.0000 mg | ORAL_TABLET | Freq: Every day | ORAL | Status: DC

## 2011-05-27 DIAGNOSIS — M47817 Spondylosis without myelopathy or radiculopathy, lumbosacral region: Secondary | ICD-10-CM | POA: Diagnosis not present

## 2011-06-07 DIAGNOSIS — M25579 Pain in unspecified ankle and joints of unspecified foot: Secondary | ICD-10-CM | POA: Diagnosis not present

## 2011-06-12 DIAGNOSIS — M25579 Pain in unspecified ankle and joints of unspecified foot: Secondary | ICD-10-CM | POA: Diagnosis not present

## 2011-06-17 ENCOUNTER — Other Ambulatory Visit: Payer: Self-pay | Admitting: *Deleted

## 2011-06-17 ENCOUNTER — Encounter: Payer: Self-pay | Admitting: Cardiology

## 2011-06-17 ENCOUNTER — Ambulatory Visit (INDEPENDENT_AMBULATORY_CARE_PROVIDER_SITE_OTHER): Payer: Medicare Other | Admitting: Cardiology

## 2011-06-17 ENCOUNTER — Other Ambulatory Visit (INDEPENDENT_AMBULATORY_CARE_PROVIDER_SITE_OTHER): Payer: Medicare Other

## 2011-06-17 DIAGNOSIS — Z01810 Encounter for preprocedural cardiovascular examination: Secondary | ICD-10-CM

## 2011-06-17 DIAGNOSIS — R079 Chest pain, unspecified: Secondary | ICD-10-CM

## 2011-06-17 DIAGNOSIS — I10 Essential (primary) hypertension: Secondary | ICD-10-CM | POA: Diagnosis not present

## 2011-06-17 DIAGNOSIS — R0602 Shortness of breath: Secondary | ICD-10-CM | POA: Diagnosis not present

## 2011-06-17 DIAGNOSIS — E785 Hyperlipidemia, unspecified: Secondary | ICD-10-CM | POA: Diagnosis not present

## 2011-06-17 DIAGNOSIS — M25579 Pain in unspecified ankle and joints of unspecified foot: Secondary | ICD-10-CM | POA: Diagnosis not present

## 2011-06-17 LAB — D-DIMER, QUANTITATIVE: D-Dimer, Quant: 2.23 ug/mL-FEU — ABNORMAL HIGH (ref 0.00–0.48)

## 2011-06-17 MED ORDER — METOPROLOL SUCCINATE ER 50 MG PO TB24: 50.0000 mg | ORAL_TABLET | Freq: Every day | ORAL | Status: DC

## 2011-06-17 NOTE — Assessment & Plan Note (Signed)
Chronic issue but worse recently. Also with atypical chest tightness. Schedule Myoview for risk stratification. She had a recent knee replacement. Check d-dimer. Check BNP. Dyspnea is most likely multifactorial including OHS/deconditioning.

## 2011-06-17 NOTE — Assessment & Plan Note (Signed)
Blood pressure elevated. Increase Toprol to 50 mg daily. 

## 2011-06-17 NOTE — Assessment & Plan Note (Signed)
Plan repeat echocardiogram to reassess aortic stenosis.

## 2011-06-17 NOTE — Assessment & Plan Note (Signed)
Symptoms atypical. Electrocardiogram normal. Arrange Myoview for risk stratification.

## 2011-06-17 NOTE — Progress Notes (Signed)
HPI: Pleasant female for fu of aortic stenosis. She did have a Myoview performed on April 29, 2003 that showed an ejection fraction of 67%. There was breast attenuation but no ischemia. Echocardiogram in March of 2011 showed normal LV function, mild aortic stenosis with a mean gradient of 13 mmHg, mild left atrial enlargement and mildly elevated pulmonary pressures. A BNP was normal. D-dimer was mildly elevated but chest CT showed no pulmonary embolus. Placed on beta blocker previously secondary to PVCs. Since I last saw her in Dec of 2011, she has noticed increased dyspnea on exertion. There is no orthopnea or PND. She occasionally has mild pedal edema. She also has chest tightness which is continuous. It is not pleuritic. No syncope.  Current Outpatient Prescriptions  Medication Sig Dispense Refill  . CALCIUM PO Take 1 tablet by mouth daily.        . cholecalciferol (VITAMIN D) 1000 UNITS tablet Take 1,000 Units by mouth daily.        Marland Kitchen losartan-hydrochlorothiazide (HYZAAR) 50-12.5 MG per tablet Take 1 daily.  90 tablet  3  . metoprolol succinate (TOPROL-XL) 25 MG 24 hr tablet Take 1 tablet (25 mg total) by mouth daily. 1 tablet daily  90 tablet  3  . multivitamin (THERAGRAN) per tablet Take 1 tablet by mouth daily.       Marland Kitchen omeprazole-sodium bicarbonate (ZEGERID) 40-1100 MG per capsule Take 1 capsule by mouth daily. 1 capsule daily      . rosuvastatin (CRESTOR) 10 MG tablet Take 5 mg by mouth every other day. Take 1/2 tablet every other day      . VITAMIN E PO Take 1 tablet by mouth daily.           Past Medical History  Diagnosis Date  . Hypertension   . Hyperlipidemia   . Generalized osteoarthrosis, involving multiple sites   . Impaired fasting glucose   . Obesity   . GERD (gastroesophageal reflux disease)     dilitation  . Personal history of other diseases of digestive system   . Colon polyp   . Dyspnea   . PONV (postoperative nausea and vomiting)   . Coronary artery disease     aortic stenosis/mild per ECHO  . Aortic stenosis     Past Surgical History  Procedure Date  . Appendectomy   . Cholecystectomy   . Knee surgery     rt  . Foot surgery     rt  . Tonsillectomy   . Total knee arthroplasty 02/01/2011    Procedure: TOTAL KNEE ARTHROPLASTY;  Surgeon: Nestor Lewandowsky;  Location: [facility] OR;  Service: Orthopedics;  Laterality: Right;  DEPUY/SIGMA    History   Social History  . Marital Status: Married    Spouse Name: N/A    Number of Children: N/A  . Years of Education: N/A   Occupational History  . Retired     [employer] care of [employer]   Social History Main Topics  . Smoking status: Former Smoker    Types: Cigarettes  . Smokeless tobacco: Never Used   Comment: as a teenager  . Alcohol Use: No  . Drug Use: No  . Sexually Active: Not on file     quit 40 yrs ago   Other Topics Concern  . Not on file   Social History Narrative   hh of 3  Married Invalid son paraplegia and husband      ROS: Occasional abdominal pain but no fevers or chills,  productive cough, hemoptysis, dysphasia, odynophagia, melena, hematochezia, dysuria, hematuria, rash, seizure activity, orthopnea, PND, claudication. Remaining systems are negative.  Physical Exam: Well-developed obese in no acute distress.  Skin is warm and dry.  HEENT is normal.  Neck is supple.  Chest is clear to auscultation with normal expansion.  Cardiovascular exam is regular rate and rhythm. 2/6 systolic murmur LSB  Abdominal exam nontender or distended. No masses palpated. Extremities show trace edema. neuro grossly intact  ECG sinus rhythm at a rate of 75. No ST changes.

## 2011-06-17 NOTE — Assessment & Plan Note (Signed)
Management per primary care. 

## 2011-06-17 NOTE — Patient Instructions (Signed)
Your physician recommends that you schedule a follow-up appointment in: 3 MONTHS WITH DR Jens Som  Your physician has requested that you have a lexiscan myoview. For further information please visit [email or web address] Please follow instruction sheet, as given.   Your physician has requested that you have an echocardiogram. Echocardiography is a painless test that uses sound waves to create images of your heart. It provides your doctor with information about the size and shape of your heart and how well your heart's chambers and valves are working. This procedure takes approximately one hour. There are no restrictions for this procedure.   Your physician recommends that you HAVE LAB WORK TODAY  INCREASE METOPROLOL SUCC TO 50 MG ONCE DAILY

## 2011-06-18 ENCOUNTER — Telehealth: Payer: Self-pay | Admitting: Cardiology

## 2011-06-18 ENCOUNTER — Ambulatory Visit (INDEPENDENT_AMBULATORY_CARE_PROVIDER_SITE_OTHER)
Admission: RE | Admit: 2011-06-18 | Discharge: 2011-06-18 | Disposition: A | Discharge status: Home / Self Care | Payer: Medicare Other | Source: Ambulatory Visit | Attending: Cardiology | Admitting: Cardiology

## 2011-06-18 DIAGNOSIS — R791 Abnormal coagulation profile: Secondary | ICD-10-CM

## 2011-06-18 DIAGNOSIS — R7989 Other specified abnormal findings of blood chemistry: Secondary | ICD-10-CM

## 2011-06-18 DIAGNOSIS — R799 Abnormal finding of blood chemistry, unspecified: Secondary | ICD-10-CM | POA: Diagnosis not present

## 2011-06-18 DIAGNOSIS — R0602 Shortness of breath: Secondary | ICD-10-CM | POA: Diagnosis not present

## 2011-06-18 LAB — BASIC METABOLIC PANEL
Calcium: 9.4 mg/dL (ref 8.4–10.5)
Creatinine, Ser: 0.8 mg/dL (ref 0.4–1.2)
GFR: 77.93 mL/min (ref >60.00)
Sodium: 139 mEq/L (ref 135–145)

## 2011-06-18 MED ORDER — IOHEXOL 300 MG/ML  SOLN
80.0000 mL | Freq: Once | INTRAMUSCULAR | Status: AC | PRN
  Administered 2011-06-18: 80 mL via INTRAVENOUS

## 2011-06-18 NOTE — Telephone Encounter (Signed)
Please return call to patient at [phone] concerning labs.

## 2011-06-18 NOTE — Telephone Encounter (Signed)
Spoke with pt, bmp normal. Pt scheduled for CTA  Of chest to r/o PE due to elevated d-dimer.

## 2011-06-21 ENCOUNTER — Ambulatory Visit (HOSPITAL_COMMUNITY): Payer: Medicare Other | Attending: Cardiology | Admitting: Radiology

## 2011-06-21 DIAGNOSIS — R0609 Other forms of dyspnea: Secondary | ICD-10-CM | POA: Diagnosis not present

## 2011-06-21 DIAGNOSIS — R0602 Shortness of breath: Secondary | ICD-10-CM

## 2011-06-21 DIAGNOSIS — R0989 Other specified symptoms and signs involving the circulatory and respiratory systems: Secondary | ICD-10-CM | POA: Insufficient documentation

## 2011-06-21 DIAGNOSIS — R079 Chest pain, unspecified: Secondary | ICD-10-CM | POA: Diagnosis not present

## 2011-06-21 DIAGNOSIS — Z87891 Personal history of nicotine dependence: Secondary | ICD-10-CM | POA: Diagnosis not present

## 2011-06-21 DIAGNOSIS — E785 Hyperlipidemia, unspecified: Secondary | ICD-10-CM | POA: Insufficient documentation

## 2011-06-21 DIAGNOSIS — I2510 Atherosclerotic heart disease of native coronary artery without angina pectoris: Secondary | ICD-10-CM | POA: Diagnosis not present

## 2011-06-21 DIAGNOSIS — R011 Cardiac murmur, unspecified: Secondary | ICD-10-CM | POA: Diagnosis not present

## 2011-06-21 DIAGNOSIS — I359 Nonrheumatic aortic valve disorder, unspecified: Secondary | ICD-10-CM | POA: Insufficient documentation

## 2011-06-21 DIAGNOSIS — I10 Essential (primary) hypertension: Secondary | ICD-10-CM | POA: Diagnosis not present

## 2011-06-21 NOTE — Progress Notes (Signed)
Echocardiogram performed.  

## 2011-06-24 DIAGNOSIS — H5231 Anisometropia: Secondary | ICD-10-CM | POA: Diagnosis not present

## 2011-06-24 DIAGNOSIS — H40019 Open angle with borderline findings, low risk, unspecified eye: Secondary | ICD-10-CM | POA: Diagnosis not present

## 2011-06-24 DIAGNOSIS — H52229 Regular astigmatism, unspecified eye: Secondary | ICD-10-CM | POA: Diagnosis not present

## 2011-06-24 DIAGNOSIS — H25019 Cortical age-related cataract, unspecified eye: Secondary | ICD-10-CM | POA: Diagnosis not present

## 2011-06-28 ENCOUNTER — Ambulatory Visit (HOSPITAL_COMMUNITY): Payer: Medicare Other | Attending: Cardiology | Admitting: Radiology

## 2011-06-28 DIAGNOSIS — R0609 Other forms of dyspnea: Secondary | ICD-10-CM | POA: Insufficient documentation

## 2011-06-28 DIAGNOSIS — I359 Nonrheumatic aortic valve disorder, unspecified: Secondary | ICD-10-CM | POA: Diagnosis not present

## 2011-06-28 DIAGNOSIS — R0602 Shortness of breath: Secondary | ICD-10-CM | POA: Diagnosis not present

## 2011-06-28 DIAGNOSIS — I2510 Atherosclerotic heart disease of native coronary artery without angina pectoris: Secondary | ICD-10-CM | POA: Insufficient documentation

## 2011-06-28 DIAGNOSIS — R011 Cardiac murmur, unspecified: Secondary | ICD-10-CM | POA: Insufficient documentation

## 2011-06-28 DIAGNOSIS — E785 Hyperlipidemia, unspecified: Secondary | ICD-10-CM | POA: Diagnosis not present

## 2011-06-28 DIAGNOSIS — I10 Essential (primary) hypertension: Secondary | ICD-10-CM | POA: Insufficient documentation

## 2011-06-28 DIAGNOSIS — R079 Chest pain, unspecified: Secondary | ICD-10-CM

## 2011-06-28 DIAGNOSIS — Z87891 Personal history of nicotine dependence: Secondary | ICD-10-CM | POA: Diagnosis not present

## 2011-06-28 DIAGNOSIS — R0989 Other specified symptoms and signs involving the circulatory and respiratory systems: Secondary | ICD-10-CM | POA: Insufficient documentation

## 2011-06-28 MED ORDER — TECHNETIUM TC 99M TETROFOSMIN IV KIT
33.0000 | PACK | Freq: Once | INTRAVENOUS | Status: AC | PRN
  Administered 2011-06-28: 33 via INTRAVENOUS

## 2011-06-28 MED ORDER — REGADENOSON 0.4 MG/5ML IV SOLN
0.4000 mg | Freq: Once | INTRAVENOUS | Status: AC
  Administered 2011-06-28: 0.4 mg via INTRAVENOUS

## 2011-06-28 NOTE — Progress Notes (Signed)
[facility] SITE 3 NUCLEAR MED [address] [place] [address] [phone]  Cardiology Nuclear Med Study  Anna Gamble is a 76 y.o. female     MRN : [id number]     DOB: 09/05/1935  Procedure Date: 06/28/2011  Nuclear Med Background Indication for Stress Test:  Evaluation for Ischemia History:  4/05 Myocardial Perfusion Study-(-) ischemia, (-) Infarct, EF=67%, and 06/21/11 Echo: EF=60-65%, mild AS/mild LVH Cardiac Risk Factors: Family History - CAD, History of Smoking, Hypertension and Lipids  Symptoms: Chest Pressure and Chest Tightness with/without exertion (last occurrence yesterday),  DOE, Fatigue, Fatigue with Exertion, Light-Headedness, Palpitations, Rapid HR and SOB   Nuclear Pre-Procedure Caffeine/Decaff Intake:  None> 12 hrs NPO After: 7:00pm   Lungs:  clear O2 Sat: 95% on room air. IV 0.9% NS with Angio Cath:  22g  IV Site: R Antecubital x 1, tolerated well IV Started by:  Irean Hong, RN  Chest Size (in):  46 Cup Size: C  Height: 5\' 2"  (1.575 m)  Weight:  413 lb (113.399 kg)  BMI:  Body mass index is 45.73 kg/(m^2). Tech Comments:  Held toprol x 24 hrs    Nuclear Med Study 1 or 2 day study: 2 day  Stress Test Type:  Eugenie Birks  Reading MD: Reuel Boom Bensimhon,M.D.  Order Authorizing Provider:  Olga Millers, MD  Resting Radionuclide: Technetium 29m Tetrofosmin  Resting Radionuclide Dose: 33.0 mCi  On      06-29-11  Stress Radionuclide:  Technetium 53m Tetrofosmin  Stress Radionuclide Dose: 33.0 mCi  On        06-28-11          Stress Protocol Rest HR: 68 Stress HR: 100  Rest BP: 133/61 Stress BP: 135/52  Exercise Time (min): n/a METS: n/a   Predicted Max HR: 147 bpm % Max HR: 68.03 bpm Rate Pressure Product: 24401   Dose of Adenosine (mg):  n/a Dose of Lexiscan: 0.4 mg  Dose of Atropine (mg): n/a Dose of Dobutamine: n/a mcg/kg/min (at max HR)  Stress Test Technologist: Irean Hong, RN  Nuclear Technologist:  Domenic Polite, CNMT      Rest Procedure:  Myocardial perfusion imaging was performed at rest 45 minutes following the intravenous administration of Technetium 22m Tetrofosmin. Rest ECG: NSR - Normal EKG  Stress Procedure:  The patient received IV Lexiscan 0.4 mg over 15-seconds.  Technetium 90m Tetrofosmin injected at 30-seconds.  There were nonspecific changes with Lexiscan. The patient complained of chest tightness. There was a rare PVC. Quantitative spect images were obtained after a 45 minute delay. Stress ECG: No significant change from baseline ECG  QPS Raw Data Images:  Normal; no motion artifact; normal heart/lung ratio. Stress Images:  Normal homogeneous uptake in all areas of the myocardium. Rest Images:  Normal homogeneous uptake in all areas of the myocardium. Subtraction (SDS):  Normal Transient Ischemic Dilatation (Normal <1.22):  0.97 Lung/Heart Ratio (Normal <0.45):  0.28  Quantitative Gated Spect Images QGS EDV:  83 ml QGS ESV:  23 ml LV Ejection Fraction: 72%.  LV Wall Motion:  NL LV Function; NL Wall Motion  Impression Exercise Capacity:  Lexiscan with no exercise. BP Response:  Normal blood pressure response. Clinical Symptoms:  n/a ECG Impression:  No significant ECG changes with Lexiscan. Comparison with Prior Nuclear Study: No images to compare  Overall Impression:  Normal stress nuclear study.    Daniel Bensimhon,MD 5:42 PM

## 2011-06-29 ENCOUNTER — Ambulatory Visit (HOSPITAL_COMMUNITY): Payer: Medicare Other | Attending: Internal Medicine

## 2011-06-29 DIAGNOSIS — R0989 Other specified symptoms and signs involving the circulatory and respiratory systems: Secondary | ICD-10-CM

## 2011-06-29 MED ORDER — TECHNETIUM TC 99M TETROFOSMIN IV KIT
33.0000 | PACK | Freq: Once | INTRAVENOUS | Status: AC | PRN
  Administered 2011-06-29: 33 via INTRAVENOUS

## 2011-06-30 ENCOUNTER — Telehealth: Payer: Self-pay | Admitting: Cardiology

## 2011-06-30 NOTE — Telephone Encounter (Signed)
Spoke with pt, aware of nuclear results 

## 2011-06-30 NOTE — Telephone Encounter (Signed)
Fu call °Patient returning your call °

## 2011-08-04 DIAGNOSIS — M1710 Unilateral primary osteoarthritis, unspecified knee: Secondary | ICD-10-CM | POA: Diagnosis not present

## 2011-08-30 DIAGNOSIS — L255 Unspecified contact dermatitis due to plants, except food: Secondary | ICD-10-CM | POA: Diagnosis not present

## 2011-09-07 ENCOUNTER — Encounter: Payer: Self-pay | Admitting: Cardiology

## 2011-09-07 ENCOUNTER — Ambulatory Visit (INDEPENDENT_AMBULATORY_CARE_PROVIDER_SITE_OTHER): Payer: Medicare Other | Admitting: Cardiology

## 2011-09-07 VITALS — BP 137/60 | HR 70 | Ht 62.0 in | Wt 254.0 lb

## 2011-09-07 DIAGNOSIS — E785 Hyperlipidemia, unspecified: Secondary | ICD-10-CM

## 2011-09-07 DIAGNOSIS — I10 Essential (primary) hypertension: Secondary | ICD-10-CM | POA: Diagnosis not present

## 2011-09-07 DIAGNOSIS — R0602 Shortness of breath: Secondary | ICD-10-CM

## 2011-09-07 DIAGNOSIS — I359 Nonrheumatic aortic valve disorder, unspecified: Secondary | ICD-10-CM | POA: Diagnosis not present

## 2011-09-07 DIAGNOSIS — E669 Obesity, unspecified: Secondary | ICD-10-CM

## 2011-09-07 NOTE — Progress Notes (Signed)
HPI: Pleasant female for fu of aortic stenosis. She did have a Myoview performed in June 2013 that showed an ejection fraction of 72% and normal perfusion. Echocardiogram in June of 2013 showed normal LV function, mild aortic stenosis with a mean gradient of 13 mmHg. CTA in May of 2013 showed no pulmonary embolus. A BNP was normal at 44. Placed on beta blocker previously secondary to PVCs. Since I last saw her in May of 2013,    Current Outpatient Prescriptions  Medication Sig Dispense Refill  . CALCIUM PO Take 1 tablet by mouth daily.        . cholecalciferol (VITAMIN D) 1000 UNITS tablet Take 1,000 Units by mouth daily.        Marland Kitchen losartan-hydrochlorothiazide (HYZAAR) 50-12.5 MG per tablet Take 1 daily.  90 tablet  3  . metoprolol succinate (TOPROL-XL) 50 MG 24 hr tablet Take 1 tablet (50 mg total) by mouth daily. 1 tablet daily  90 tablet  3  . multivitamin (THERAGRAN) per tablet Take 1 tablet by mouth daily.       Marland Kitchen omeprazole-sodium bicarbonate (ZEGERID) 40-1100 MG per capsule Take 1 capsule by mouth daily. 1 capsule daily      . rosuvastatin (CRESTOR) 10 MG tablet Take 5 mg by mouth every other day. Take 1/2 tablet every other day      . VITAMIN E PO Take 1 tablet by mouth daily.           Past Medical History  Diagnosis Date  . Hypertension   . Hyperlipidemia   . Generalized osteoarthrosis, involving multiple sites   . Impaired fasting glucose   . Obesity   . GERD (gastroesophageal reflux disease)     dilitation  . Personal history of other diseases of digestive system   . Colon polyp   . Dyspnea   . PONV (postoperative nausea and vomiting)   . Coronary artery disease     aortic stenosis/mild per ECHO  . Aortic stenosis     Past Surgical History  Procedure Date  . Appendectomy   . Cholecystectomy   . Knee surgery     rt  . Foot surgery     rt  . Tonsillectomy   . Total knee arthroplasty 02/01/2011    Procedure: TOTAL KNEE ARTHROPLASTY;  Surgeon: Nestor Lewandowsky;   Location: [facility] OR;  Service: Orthopedics;  Laterality: Right;  DEPUY/SIGMA    History   Social History  . Marital Status: Married    Spouse Name: N/A    Number of Children: N/A  . Years of Education: N/A   Occupational History  . Retired     [employer] care of [employer]   Social History Main Topics  . Smoking status: Never Smoker   . Smokeless tobacco: Never Used   Comment: as a teenager  . Alcohol Use: No  . Drug Use: No  . Sexually Active: Not on file     quit 40 yrs ago   Other Topics Concern  . Not on file   Social History Narrative   hh of 3  Married Invalid son paraplegia and husband      ROS: no fevers or chills, productive cough, hemoptysis, dysphasia, odynophagia, melena, hematochezia, dysuria, hematuria, rash, seizure activity, orthopnea, PND, pedal edema, claudication. Remaining systems are negative.  Physical Exam: Well-developed obese in no acute distress.  Skin is warm and dry.  HEENT is normal.  Neck is supple.  Chest is clear to auscultation with normal  expansion.  Cardiovascular exam is regular rate and rhythm. 2/6 systolic murmur LSB Abdominal exam nontender or distended. No masses palpated. Extremities show no edema. neuro grossly intact  ECG sinus rhythm at a rate of 70. No ST changes.

## 2011-09-07 NOTE — Patient Instructions (Addendum)
Your physician wants you to follow-up in: ONE YEAR WITH DR CRENSHAW You will receive a reminder letter in the mail two months in advance. If you don't receive a letter, please call our office to schedule the follow-up appointment.  

## 2011-09-07 NOTE — Assessment & Plan Note (Signed)
LV function normal, aortic stenosis mild and BNP normal. Chest CT showed no pulmonary embolus. Probable OHS and deconditioning.

## 2011-09-07 NOTE — Assessment & Plan Note (Signed)
Blood pressure controlled. Continue present medications. 

## 2011-09-07 NOTE — Assessment & Plan Note (Signed)
Discussed weight loss. 

## 2011-09-07 NOTE — Assessment & Plan Note (Signed)
Aortic stenosis mild on most recent echo. Plan followup studies in the future.

## 2011-09-07 NOTE — Assessment & Plan Note (Signed)
Management per primary care. 

## 2011-09-08 ENCOUNTER — Other Ambulatory Visit: Payer: Self-pay

## 2011-09-08 DIAGNOSIS — D239 Other benign neoplasm of skin, unspecified: Secondary | ICD-10-CM | POA: Diagnosis not present

## 2011-09-08 DIAGNOSIS — L821 Other seborrheic keratosis: Secondary | ICD-10-CM | POA: Diagnosis not present

## 2011-09-08 DIAGNOSIS — L570 Actinic keratosis: Secondary | ICD-10-CM | POA: Diagnosis not present

## 2011-09-08 DIAGNOSIS — Z1231 Encounter for screening mammogram for malignant neoplasm of breast: Secondary | ICD-10-CM | POA: Diagnosis not present

## 2011-09-08 DIAGNOSIS — L259 Unspecified contact dermatitis, unspecified cause: Secondary | ICD-10-CM | POA: Diagnosis not present

## 2011-09-08 DIAGNOSIS — Z85828 Personal history of other malignant neoplasm of skin: Secondary | ICD-10-CM | POA: Diagnosis not present

## 2011-09-08 DIAGNOSIS — D485 Neoplasm of uncertain behavior of skin: Secondary | ICD-10-CM | POA: Diagnosis not present

## 2011-09-08 DIAGNOSIS — D2330 Other benign neoplasm of skin of unspecified part of face: Secondary | ICD-10-CM | POA: Diagnosis not present

## 2011-09-09 DIAGNOSIS — N63 Unspecified lump in unspecified breast: Secondary | ICD-10-CM | POA: Diagnosis not present

## 2011-09-09 DIAGNOSIS — M25579 Pain in unspecified ankle and joints of unspecified foot: Secondary | ICD-10-CM | POA: Diagnosis not present

## 2011-09-16 ENCOUNTER — Encounter: Payer: Self-pay | Admitting: Internal Medicine

## 2011-10-06 ENCOUNTER — Encounter: Payer: Self-pay | Admitting: Internal Medicine

## 2011-10-29 ENCOUNTER — Telehealth: Payer: Self-pay | Admitting: Internal Medicine

## 2011-10-29 NOTE — Telephone Encounter (Signed)
Patient called stating that per medicare guidelines she has to have her cpx this year and last year she had it on 01/13/11 which means we have to start at 01/14/12 looking. Can we find an early appt for her to come fasting before 01/18/12.

## 2011-11-01 ENCOUNTER — Other Ambulatory Visit: Payer: Self-pay | Admitting: Orthopedic Surgery

## 2011-11-01 DIAGNOSIS — M545 Low back pain, unspecified: Secondary | ICD-10-CM

## 2011-11-01 DIAGNOSIS — M47817 Spondylosis without myelopathy or radiculopathy, lumbosacral region: Secondary | ICD-10-CM | POA: Diagnosis not present

## 2011-11-02 NOTE — Telephone Encounter (Signed)
FYI  She can be seen anytime for the medication and disease state management  .  The prevention visit is really not  For a physical exam or medication  We just merge this for  convenience  One visit instead of 2 .   medication management . However if she wants to do this then there are limited spaces  Because of doctors being out of office.  On those weeks.  Can come in at the 815  830 slot for Friday 27th . If she wants to proceed this way.

## 2011-11-05 ENCOUNTER — Ambulatory Visit
Admission: RE | Admit: 2011-11-05 | Discharge: 2011-11-05 | Disposition: A | Discharge status: Home / Self Care | Payer: Medicare Other | Source: Ambulatory Visit | Attending: Orthopedic Surgery | Admitting: Orthopedic Surgery

## 2011-11-05 DIAGNOSIS — M4310 Spondylolisthesis, site unspecified: Secondary | ICD-10-CM | POA: Diagnosis not present

## 2011-11-05 DIAGNOSIS — M47817 Spondylosis without myelopathy or radiculopathy, lumbosacral region: Secondary | ICD-10-CM | POA: Diagnosis not present

## 2011-11-05 DIAGNOSIS — M545 Low back pain, unspecified: Secondary | ICD-10-CM

## 2011-12-02 DIAGNOSIS — Z23 Encounter for immunization: Secondary | ICD-10-CM | POA: Diagnosis not present

## 2011-12-06 ENCOUNTER — Encounter: Payer: Self-pay | Admitting: Internal Medicine

## 2012-01-14 ENCOUNTER — Ambulatory Visit (INDEPENDENT_AMBULATORY_CARE_PROVIDER_SITE_OTHER): Payer: Medicare Other | Admitting: Internal Medicine

## 2012-01-14 ENCOUNTER — Encounter: Payer: Self-pay | Admitting: Internal Medicine

## 2012-01-14 VITALS — BP 142/72 | HR 68 | Temp 98.3°F | Ht 62.5 in | Wt 256.0 lb

## 2012-01-14 DIAGNOSIS — R0602 Shortness of breath: Secondary | ICD-10-CM

## 2012-01-14 DIAGNOSIS — I10 Essential (primary) hypertension: Secondary | ICD-10-CM

## 2012-01-14 DIAGNOSIS — E785 Hyperlipidemia, unspecified: Secondary | ICD-10-CM

## 2012-01-14 DIAGNOSIS — M159 Polyosteoarthritis, unspecified: Secondary | ICD-10-CM

## 2012-01-14 DIAGNOSIS — R7309 Other abnormal glucose: Secondary | ICD-10-CM

## 2012-01-14 DIAGNOSIS — Z0000 Encounter for general adult medical examination without abnormal findings: Secondary | ICD-10-CM

## 2012-01-14 DIAGNOSIS — E669 Obesity, unspecified: Secondary | ICD-10-CM

## 2012-01-14 DIAGNOSIS — I359 Nonrheumatic aortic valve disorder, unspecified: Secondary | ICD-10-CM

## 2012-01-14 DIAGNOSIS — R7301 Impaired fasting glucose: Secondary | ICD-10-CM

## 2012-01-14 LAB — HEMOGLOBIN A1C: Hgb A1c MFr Bld: 6.5 % (ref 4.6–6.5)

## 2012-01-14 LAB — CBC WITH DIFFERENTIAL/PLATELET
Eosinophils Relative: 2.2 % (ref 0.0–5.0)
Lymphocytes Relative: 42.8 % (ref 12.0–46.0)
Monocytes Relative: 11 % (ref 3.0–12.0)
Neutrophils Relative %: 43.5 % (ref 43.0–77.0)
Platelets: 261 10*3/uL (ref 150.0–400.0)
WBC: 5.4 10*3/uL (ref 4.5–10.5)

## 2012-01-14 LAB — HEPATIC FUNCTION PANEL
ALT: 26 U/L (ref 0–35)
Alkaline Phosphatase: 57 U/L (ref 39–117)
Bilirubin, Direct: 0.1 mg/dL (ref 0.0–0.3)
Total Bilirubin: 0.7 mg/dL (ref 0.3–1.2)
Total Protein: 6.9 g/dL (ref 6.0–8.3)

## 2012-01-14 LAB — LIPID PANEL
HDL: 42.7 mg/dL (ref >39.00)
Total CHOL/HDL Ratio: 6

## 2012-01-14 LAB — BASIC METABOLIC PANEL
BUN: 16 mg/dL (ref 6–23)
Creatinine, Ser: 0.8 mg/dL (ref 0.4–1.2)
GFR: 77.8 mL/min (ref >60.00)

## 2012-01-14 LAB — TSH: TSH: 1.34 u[IU]/mL (ref 0.35–5.50)

## 2012-01-14 MED ORDER — ROSUVASTATIN CALCIUM 10 MG PO TABS: ORAL_TABLET | ORAL | Status: DC

## 2012-01-14 NOTE — Patient Instructions (Addendum)
Continue lifestyle intervention healthy eating and exercise . Contact dr Kinnie Scales about when due for colonsocopy.   Look into getting the shingles vaccine coverage call for injection appointment only if you wish to do this. Weight loss with healthy diet and continued exercise even water exercise will be helpful for your health.  Will contact you about lab results when they're available. Followup in 6 months or as needed.

## 2012-01-14 NOTE — Progress Notes (Signed)
Chief Complaint  Patient presents with  . Annual Exam    Medicare    HPI: Patient comes in today for Preventive Medicare wellness visit . No major injuries, ed visits , , new medications since last visit. Did have surgery right knee tkr and doing much better   Lipids needs refill crestor no se taking every day now about 5 mg  AS mild  Sob felt to be OHS and deconditioning per cards . No change in status Son has diabetes now and doing better with diet  although not as good through holidays    Hearing: ok   Vision:  No limitations at present . Last eye check UTD  Safety:  Has smoke detector and wears seat belts.  No firearms. No excess sun exposure. Sees dentist regularly.  Falls: no  Advance directive :  Reviewed    Memory: Felt to be good  , no concern from her or her family.  Depression: No anhedonia unusual crying or depressive symptoms  Nutrition: Eats  balanced diet; adequate calcium and vitamin D. No swallowing chewing problems.  Injury: no major injuries in the last six months.  Other healthcare providers:  Reviewed today .  Social:  Lives with spouse married. No pets.   Preventive parameters: up-to-date  Reviewed  ? Due for colon   ADLS:   There are no problems or need for assistance  driving, feeding, obtaining food, dressing, toileting and bathing, managing money using phone. She is independent.  EXERCISE/ HABITS   3 x water exercisePer week   30 - 60 min per session No tobacco    noetoh   ROS:  GEN/ HEENT: No fever, significant weight changes sweats headaches vision problems hearing changes, CV/ PULM; No chest paincough, syncope,edema  change in exercise tolerance. GI /GU: No adominal pain, vomiting, change in bowel habits. No blood in the stool. No significant GU symptoms had gyne eval in past for bleeding and none recently negt eval. SKIN/HEME: ,no acute skin rashes suspicious lesions or bleeding. No lymphadenopathy, nodules, masses.  NEURO/ PSYCH:  No  neurologic signs such as weakness numbness. No depression anxiety. IMM/ Allergy: No unusual infections.  Allergy .   Left foot issue hard to exercise on land  REST of 12 system review negative except as per HPI   Past Medical History  Diagnosis Date  . Hypertension   . Hyperlipidemia   . Generalized osteoarthrosis, involving multiple sites   . Impaired fasting glucose   . Obesity   . GERD (gastroesophageal reflux disease)     dilitation  . Personal history of other diseases of digestive system   . Colon polyp   . Dyspnea   . PONV (postoperative nausea and vomiting)   . Coronary artery disease     aortic stenosis/mild per ECHO  . Aortic stenosis     Family History  Problem Relation Age of Onset  . Coronary artery disease      female- 1st degree relative  . Coronary artery disease      female- 1st degree relative  . Hypertension    . Hyperlipidemia    . Heart attack Mother     age 34's  . Heart attack Father     age 28's  . Stroke Brother     died   . Other Son     paraplegia 35  . Sleep apnea Son     History   Social History  . Marital Status: Married    Spouse  Name: N/A    Number of Children: N/A  . Years of Education: N/A   Occupational History  . Retired     [employer] care of [employer]   Social History Main Topics  . Smoking status: Never Smoker   . Smokeless tobacco: Never Used     Comment: as a teenager  . Alcohol Use: No  . Drug Use: No  . Sexually Active: None     Comment: quit 40 yrs ago   Other Topics Concern  . None   Social History Narrative   hh of 3  Married Invalid son paraplegia and husband    And grandson recently  No pets    Outpatient Encounter Prescriptions as of 01/14/2012  Medication Sig Dispense Refill  . CALCIUM PO Take 1 tablet by mouth daily.        . cholecalciferol (VITAMIN D) 1000 UNITS tablet Take 1,000 Units by mouth daily.        Marland Kitchen losartan-hydrochlorothiazide (HYZAAR) 50-12.5 MG per tablet Take 1 daily.  90  tablet  3  . metoprolol succinate (TOPROL-XL) 50 MG 24 hr tablet Take 1 tablet (50 mg total) by mouth daily. 1 tablet daily  90 tablet  3  . multivitamin (THERAGRAN) per tablet Take 1 tablet by mouth daily.       Marland Kitchen omeprazole-sodium bicarbonate (ZEGERID) 40-1100 MG per capsule Take 1 capsule by mouth daily. 1 capsule daily      . rosuvastatin (CRESTOR) 10 MG tablet Take 1/2 tablet everyday or as directed  90 tablet  3  . VITAMIN E PO Take 1 tablet by mouth daily.        . [DISCONTINUED] rosuvastatin (CRESTOR) 10 MG tablet Take 5 mg by mouth every other day. Take 1/2 tablet every other day        EXAM:  BP 142/72  Pulse 68  Temp 98.3 F (36.8 C) (Oral)  Ht 5' 2.5" (1.588 m)  Wt 256 lb (116.121 kg)  BMI 46.08 kg/m2  SpO2 98%  Body mass index is 46.08 kg/(m^2). Wt Readings from Last 3 Encounters:  01/14/12 256 lb (116.121 kg)  09/07/11 254 lb (115.214 kg)  06/28/11 250 lb (113.399 kg)    Physical Exam: Vital signs reviewed [id number] is a well-developed well-nourished alert cooperative   who appears stated age in no acute distress.  HEENT: normocephalic atraumatic , Eyes: PERRL EOM's full, conjunctiva clear, Nares: paten,t no deformity discharge or tenderness., Ears: no deformity EAC's clear TMs with normal landmarks. Mouth: clear OP, no lesions, edema.  Moist mucous membranes. Dentition in adequate repair. NECK: supple without masses, thyromegaly or bruits. CHEST/PULM:  Clear to auscultation and percussion breath sounds equal no wheeze , rales or rhonchi. No chest wall deformities or tenderness. CV: PMI is nondisplaced, S1 S2 no gallops, ,  softsem upper chest left no radiation rubs. Peripheral pulses are full without delay.No JVD .  Breast large lumpy no disc nodule  ABDOMEN: Bowel sounds normal nontender  No guard or rebound, no hepato splenomegal no CVA tenderness.  No hernia. Extremtities:  No clubbing cyanosis or edema, no acute joint swelling or redness no focal atrophy  wellhealed knee scar right NEURO:  Oriented x3, cranial nerves 3-12 appear to be intact, no obvious focal weakness,gait within normal limits no abnormal reflexes or asymmetrical mildy antalgic  SKIN: No acute rashes normal turgor, color, no bruising or petechiae. PSYCH: Oriented, good eye contact, no obvious depression anxiety, cognition and judgment appear normal. LN: no cervical  axillary inguinal adenopathy No noted deficits in memory, attention, and speech.   Lab Results  Component Value Date   WBC 10.5 02/04/2011   HGB 9.4* 02/04/2011   HCT 27.8* 02/04/2011   PLT 193 02/04/2011   GLUCOSE 108* 06/17/2011   CHOL 202* 01/13/2011   TRIG 195.0* 01/13/2011   HDL 53.60 01/13/2011   LDLDIRECT 122.8 01/13/2011   LDLCALC 101* 08/13/2009   ALT 29 01/13/2011   AST 21 01/13/2011   NA 139 06/17/2011   K 4.4 06/17/2011   CL 103 06/17/2011   CREATININE 0.8 06/17/2011   BUN 16 06/17/2011   CO2 27 06/17/2011   TSH 1.39 01/13/2011   INR 1.45 02/05/2011   HGBA1C 6.3 01/13/2011    ASSESSMENT AND PLAN:  Discussed the following assessment and plan:  1. Medicare annual wellness visit, subsequent  Basic metabolic panel, CBC with Differential, Lipid panel, rosuvastatin (CRESTOR) 10 MG tablet  2. HYPERLIPIDEMIA  Basic metabolic panel, Hepatic function panel, Lipid panel, TSH   check lipids test and refill med today   3. HYPERTENSION  Basic metabolic panel, CBC with Differential, Hepatic function panel, Lipid panel, TSH   has been good to monitor  borderline up today  4. Aortic valve disorders  Basic metabolic panel, CBC with Differential, Hepatic function panel, Lipid panel, TSH   mild  5. FASTING HYPERGLYCEMIA  CBC with Differential, Hemoglobin A1c, rosuvastatin (CRESTOR) 10 MG tablet   at risk for dm check a1c today   6. GEN OSTEOARTHROSIS INVOLVING MULTIPLE SITES  CBC with Differential, rosuvastatin (CRESTOR) 10 MG tablet  7. OBESITY  CBC with Differential   advise lose weight   8. Impaired fasting  glucose  rosuvastatin (CRESTOR) 10 MG tablet  9. FASTING HYPERGLYCEMIA  CBC with Differential, Hemoglobin A1c, rosuvastatin (CRESTOR) 10 MG tablet  10. SOB  rosuvastatin (CRESTOR) 10 MG tablet    Patient Care Team: [name], MD as PCP - General Nyoka Cowden, MD (Pulmonary Disease) Nestor Lewandowsky, MD (Orthopedic Surgery) Lunette Stands, MD (Orthopedic Surgery) Griffith Citron, MD (Gastroenterology) Lewayne Bunting, MD as Attending Physician (Cardiology) Bl[facility] Ledger [facility])  Patient Instructions  Continue lifestyle intervention healthy eating and exercise . Contact dr Kinnie Scales about when due for colonsocopy.   Look into getting the shingles vaccine coverage call for injection appointment only if you wish to do this. Weight loss with healthy diet and continued exercise even water exercise will be helpful for your health.  Will contact you about lab results when they're available. Followup in 6 months or as needed.   Neta Mends. [name] M.D.

## 2012-01-20 ENCOUNTER — Telehealth: Payer: Self-pay | Admitting: Internal Medicine

## 2012-01-20 NOTE — Telephone Encounter (Signed)
Pt notified by telephone. 

## 2012-01-20 NOTE — Telephone Encounter (Signed)
Pt would like blood work results °

## 2012-02-23 ENCOUNTER — Other Ambulatory Visit: Payer: Self-pay | Admitting: Internal Medicine

## 2012-03-06 ENCOUNTER — Other Ambulatory Visit: Payer: Self-pay | Admitting: Family Medicine

## 2012-03-06 ENCOUNTER — Telehealth: Payer: Self-pay | Admitting: Internal Medicine

## 2012-03-06 MED ORDER — LOSARTAN POTASSIUM-HCTZ 50-12.5 MG PO TABS: ORAL_TABLET | ORAL | Status: DC

## 2012-03-06 NOTE — Telephone Encounter (Signed)
Pt said losartan was denied thru [facility] please resubmit

## 2012-03-06 NOTE — Telephone Encounter (Signed)
Spoke to the pt.  She informed me that [facility] said that she had no more refills on her prescription.  A rx was sent on 02/23/12.  It must not have been received.  Sent in a new rx for 6 months along with a 30 day supply to [facility].  She only had 3 more pills left.

## 2012-03-06 NOTE — Telephone Encounter (Signed)
Misty, No I haven't. She is taking HYZAAR, so maybe it is not preferred. But I have not received anything from anyone.

## 2012-03-06 NOTE — Telephone Encounter (Signed)
Have you received a prior authorization for this patient?

## 2012-03-13 DIAGNOSIS — Z09 Encounter for follow-up examination after completed treatment for conditions other than malignant neoplasm: Secondary | ICD-10-CM | POA: Diagnosis not present

## 2012-03-13 DIAGNOSIS — N6019 Diffuse cystic mastopathy of unspecified breast: Secondary | ICD-10-CM | POA: Diagnosis not present

## 2012-04-05 ENCOUNTER — Encounter: Payer: Self-pay | Admitting: Internal Medicine

## 2012-04-17 DIAGNOSIS — R10819 Abdominal tenderness, unspecified site: Secondary | ICD-10-CM | POA: Diagnosis not present

## 2012-04-17 DIAGNOSIS — Z86010 Personal history of colonic polyps: Secondary | ICD-10-CM | POA: Diagnosis not present

## 2012-04-19 DIAGNOSIS — M76899 Other specified enthesopathies of unspecified lower limb, excluding foot: Secondary | ICD-10-CM | POA: Diagnosis not present

## 2012-04-27 DIAGNOSIS — Z86010 Personal history of colonic polyps: Secondary | ICD-10-CM | POA: Diagnosis not present

## 2012-04-27 DIAGNOSIS — R1013 Epigastric pain: Secondary | ICD-10-CM | POA: Diagnosis not present

## 2012-04-27 DIAGNOSIS — K5730 Diverticulosis of large intestine without perforation or abscess without bleeding: Secondary | ICD-10-CM | POA: Diagnosis not present

## 2012-04-27 DIAGNOSIS — K648 Other hemorrhoids: Secondary | ICD-10-CM | POA: Diagnosis not present

## 2012-04-27 DIAGNOSIS — K219 Gastro-esophageal reflux disease without esophagitis: Secondary | ICD-10-CM | POA: Diagnosis not present

## 2012-04-27 DIAGNOSIS — D126 Benign neoplasm of colon, unspecified: Secondary | ICD-10-CM | POA: Diagnosis not present

## 2012-04-27 DIAGNOSIS — Z1211 Encounter for screening for malignant neoplasm of colon: Secondary | ICD-10-CM | POA: Diagnosis not present

## 2012-05-31 DIAGNOSIS — K648 Other hemorrhoids: Secondary | ICD-10-CM | POA: Diagnosis not present

## 2012-06-14 DIAGNOSIS — K648 Other hemorrhoids: Secondary | ICD-10-CM | POA: Diagnosis not present

## 2012-06-28 DIAGNOSIS — K648 Other hemorrhoids: Secondary | ICD-10-CM | POA: Diagnosis not present

## 2012-07-14 ENCOUNTER — Ambulatory Visit (INDEPENDENT_AMBULATORY_CARE_PROVIDER_SITE_OTHER): Payer: Medicare Other | Admitting: Internal Medicine

## 2012-07-14 ENCOUNTER — Encounter: Payer: Self-pay | Admitting: Internal Medicine

## 2012-07-14 VITALS — BP 138/78 | Temp 98.1°F | Wt 249.0 lb

## 2012-07-14 DIAGNOSIS — E669 Obesity, unspecified: Secondary | ICD-10-CM | POA: Diagnosis not present

## 2012-07-14 DIAGNOSIS — Z86010 Personal history of colonic polyps: Secondary | ICD-10-CM

## 2012-07-14 DIAGNOSIS — M1990 Unspecified osteoarthritis, unspecified site: Secondary | ICD-10-CM

## 2012-07-14 DIAGNOSIS — I10 Essential (primary) hypertension: Secondary | ICD-10-CM

## 2012-07-14 DIAGNOSIS — I359 Nonrheumatic aortic valve disorder, unspecified: Secondary | ICD-10-CM

## 2012-07-14 DIAGNOSIS — Z9889 Other specified postprocedural states: Secondary | ICD-10-CM

## 2012-07-14 DIAGNOSIS — R7301 Impaired fasting glucose: Secondary | ICD-10-CM | POA: Diagnosis not present

## 2012-07-14 HISTORY — DX: Personal history of colonic polyps: Z86.010

## 2012-07-14 HISTORY — DX: Other specified postprocedural states: Z98.890

## 2012-07-14 NOTE — Progress Notes (Signed)
Chief Complaint  Patient presents with  . Follow-up    HPI: Patient comes in today for follow up of  multiple medical problems.   Bp slightly on high side   ? Anxiety .     ROS: See pertinent positives and negatives per HPI. No falling or new injury   lbp   Stomach or back   Per dr Charlett Blake   Takes ocass advil at night .   For joints no new swelling or bleeding     Dr Kinnie Scales took out polyps   Colonoscopy  .  Takes care of grand kids  Ann Maki paraplegic now diabetic  cooking for diabetes   Past Medical History  Diagnosis Date  . Hypertension   . Hyperlipidemia   . Generalized osteoarthrosis, involving multiple sites   . Impaired fasting glucose   . Obesity   . GERD (gastroesophageal reflux disease)     dilitation  . Personal history of other diseases of digestive system   . Colon polyp   . Dyspnea   . PONV (postoperative nausea and vomiting)   . Coronary artery disease     aortic stenosis/mild per ECHO  . Aortic stenosis     Family History  Problem Relation Age of Onset  . Coronary artery disease      female- 1st degree relative  . Coronary artery disease      female- 1st degree relative  . Hypertension    . Hyperlipidemia    . Heart attack Mother     age 76's  . Heart attack Father     age 85's  . Stroke Brother     died   . Other Son     paraplegia 44  . Sleep apnea Son     History   Social History  . Marital Status: Married    Spouse Name: N/A    Number of Children: N/A  . Years of Education: N/A   Occupational History  . Retired     [employer] care of [employer]   Social History Main Topics  . Smoking status: Never Smoker   . Smokeless tobacco: Never Used     Comment: as a teenager  . Alcohol Use: No  . Drug Use: No  . Sexually Active: None     Comment: quit 40 yrs ago   Other Topics Concern  . None   Social History Narrative   hh of 3  Married    Invalid son paraplegia and husband    And grandson recently     No pets              Outpatient Encounter Prescriptions as of 07/14/2012  Medication Sig Dispense Refill  . CALCIUM PO Take 1 tablet by mouth daily.        . cholecalciferol (VITAMIN D) 1000 UNITS tablet Take 1,000 Units by mouth daily.        Marland Kitchen losartan-hydrochlorothiazide (HYZAAR) 50-12.5 MG per tablet Take 1 tablet by mouth  daily  90 tablet  1  . metoprolol succinate (TOPROL-XL) 50 MG 24 hr tablet Take 1 tablet (50 mg total) by mouth daily. 1 tablet daily  90 tablet  3  . multivitamin (THERAGRAN) per tablet Take 1 tablet by mouth daily.       Marland Kitchen omeprazole-sodium bicarbonate (ZEGERID) 40-1100 MG per capsule Take 1 capsule by mouth daily. 1 capsule daily      . rosuvastatin (CRESTOR) 10 MG tablet Take 1/2 tablet everyday or as directed  90 tablet  3  . VITAMIN E PO Take 1 tablet by mouth daily.        . [DISCONTINUED] losartan-hydrochlorothiazide (HYZAAR) 50-12.5 MG per tablet Take 1 tablet by mouth  daily  30 tablet  0   No facility-administered encounter medications on file as of 07/14/2012.    EXAM:  BP 138/78  Temp(Src) 98.1 F (36.7 C) (Oral)  Wt 249 lb (112.946 kg)  BMI 44.79 kg/m2  SpO2 92%  Body mass index is 44.79 kg/(m^2).  GENERAL: vitals reviewed and listed above, alert, oriented, appears well hydrated and in no acute distress HEENT: atraumatic, conjunctiva  clear, no obvious abnormalities on inspection of external nose and ears NECK: no obvious masses on inspection palpation  No masses LUNGS: clear to auscultation bilaterally, no wheezes, rales or rhonchi, good air movement CV: HRRR, no clubbing cyanosis or  peripheral edema nl cap refill  2/6 sem lusb MS: moves all extremities without noticeable focal  Abnormality  Walks with cane  To avoid falling. midly antalgic  PSYCH: pleasant and cooperative, no obvious depression or anxiety Lab Results  Component Value Date   WBC 5.4 01/14/2012   HGB 13.4 01/14/2012   HCT 39.2 01/14/2012   PLT 261.0 01/14/2012   GLUCOSE 113* 01/14/2012    CHOL 238* 01/14/2012   TRIG 228.0* 01/14/2012   HDL 42.70 01/14/2012   LDLDIRECT 168.3 01/14/2012   LDLCALC 101* 08/13/2009   ALT 26 01/14/2012   AST 21 01/14/2012   NA 140 01/14/2012   K 4.5 01/14/2012   CL 103 01/14/2012   CREATININE 0.8 01/14/2012   BUN 16 01/14/2012   CO2 29 01/14/2012   TSH 1.34 01/14/2012   INR 1.45 02/05/2011   HGBA1C 6.5 01/14/2012    ASSESSMENT AND PLAN:  Discussed the following assessment and plan:  Impaired fasting glucose - Plan: POCT glucose (manual entry)  HYPERTENSION  Aortic valve disorders  OBESITY  DJD (degenerative joint disease) - back etc.  Hx of colonoscopy with polypectomy Counseled.non med change at this time  Consider water exercise  -Patient advised to return or notify health care team  if symptoms worsen or persist or new concerns arise.  Patient Instructions  Continue lifestyle intervention healthy eating and exercise .  Losing weight will help your blood sugar  Is stable oin the prediabetic range .  Wellness visit in 6 months and we will do a full set of labs at that time .  Check into shingles vaccine   Cost and can get this at any time.    Neta Mends. Donjuan Robison M.D.

## 2012-07-14 NOTE — Patient Instructions (Signed)
Continue lifestyle intervention healthy eating and exercise .  Losing weight will help your blood sugar  Is stable oin the prediabetic range .  Wellness visit in 6 months and we will do a full set of labs at that time .  Check into shingles vaccine   Cost and can get this at any time.

## 2012-07-27 ENCOUNTER — Other Ambulatory Visit: Payer: Self-pay | Admitting: *Deleted

## 2012-07-27 MED ORDER — METOPROLOL SUCCINATE ER 50 MG PO TB24: 50.0000 mg | ORAL_TABLET | Freq: Every day | ORAL | Status: DC

## 2012-08-21 ENCOUNTER — Ambulatory Visit (INDEPENDENT_AMBULATORY_CARE_PROVIDER_SITE_OTHER): Payer: Medicare Other | Admitting: Cardiology

## 2012-08-21 ENCOUNTER — Encounter: Payer: Self-pay | Admitting: Cardiology

## 2012-08-21 VITALS — BP 120/84 | HR 73 | Wt 247.0 lb

## 2012-08-21 DIAGNOSIS — E785 Hyperlipidemia, unspecified: Secondary | ICD-10-CM

## 2012-08-21 DIAGNOSIS — E669 Obesity, unspecified: Secondary | ICD-10-CM

## 2012-08-21 DIAGNOSIS — I25810 Atherosclerosis of coronary artery bypass graft(s) without angina pectoris: Secondary | ICD-10-CM | POA: Diagnosis not present

## 2012-08-21 DIAGNOSIS — I10 Essential (primary) hypertension: Secondary | ICD-10-CM | POA: Diagnosis not present

## 2012-08-21 DIAGNOSIS — I359 Nonrheumatic aortic valve disorder, unspecified: Secondary | ICD-10-CM | POA: Diagnosis not present

## 2012-08-21 DIAGNOSIS — I2510 Atherosclerotic heart disease of native coronary artery without angina pectoris: Secondary | ICD-10-CM

## 2012-08-21 DIAGNOSIS — R0602 Shortness of breath: Secondary | ICD-10-CM

## 2012-08-21 MED ORDER — LOSARTAN POTASSIUM-HCTZ 50-12.5 MG PO TABS: ORAL_TABLET | ORAL | Status: DC

## 2012-08-21 NOTE — Assessment & Plan Note (Signed)
Aortic stenosis sounds worse. Will repeat echocardiogram.

## 2012-08-21 NOTE — Assessment & Plan Note (Signed)
Continue statin. Management per primary care. 

## 2012-08-21 NOTE — Progress Notes (Signed)
HPI: Pleasant female for fu of aortic stenosis. She did have a Myoview performed in June 2013 that showed an ejection fraction of 72% and normal perfusion. Echocardiogram in June of 2013 showed normal LV function, mild aortic stenosis with a mean gradient of 13 mmHg. CTA in May of 2013 showed no pulmonary embolus. A BNP was normal at 44. Placed on beta blocker previously secondary to PVCs. Since I last saw her in August of 2013, she continues to have dyspnea on exertion but may be mildly improved. No orthopnea, PND or increased pedal edema. No chest pain or syncope.   Current Outpatient Prescriptions  Medication Sig Dispense Refill  . aspirin 81 MG tablet Take 81 mg by mouth daily.      Marland Kitchen CALCIUM PO Take 1 tablet by mouth daily.        . cholecalciferol (VITAMIN D) 1000 UNITS tablet Take 1,000 Units by mouth daily.        Marland Kitchen losartan-hydrochlorothiazide (HYZAAR) 50-12.5 MG per tablet Take 1 tablet by mouth  daily  90 tablet  1  . metoprolol succinate (TOPROL-XL) 50 MG 24 hr tablet Take 1 tablet (50 mg total) by mouth daily. 1 tablet daily  90 tablet  3  . multivitamin (THERAGRAN) per tablet Take 1 tablet by mouth daily.       Marland Kitchen omeprazole-sodium bicarbonate (ZEGERID) 40-1100 MG per capsule Take 1 capsule by mouth daily. 1 capsule daily      . rosuvastatin (CRESTOR) 10 MG tablet Take 1/2 tablet everyday or as directed  90 tablet  3  . VITAMIN E PO Take 1 tablet by mouth daily.         No current facility-administered medications for this visit.     Past Medical History  Diagnosis Date  . Hypertension   . Hyperlipidemia   . Generalized osteoarthrosis, involving multiple sites   . Impaired fasting glucose   . Obesity   . GERD (gastroesophageal reflux disease)     dilitation  . Personal history of other diseases of digestive system   . Colon polyp   . Dyspnea   . PONV (postoperative nausea and vomiting)   . Coronary artery disease     aortic stenosis/mild per ECHO  . Aortic stenosis      Past Surgical History  Procedure Laterality Date  . Appendectomy    . Cholecystectomy    . Knee surgery      rt  . Foot surgery      rt  . Tonsillectomy    . Total knee arthroplasty  02/01/2011    Procedure: TOTAL KNEE ARTHROPLASTY;  Surgeon: Nestor Lewandowsky;  Location: [facility] OR;  Service: Orthopedics;  Laterality: Right;  DEPUY/SIGMA    History   Social History  . Marital Status: Married    Spouse Name: N/A    Number of Children: N/A  . Years of Education: N/A   Occupational History  . Retired     [employer] care of [employer]   Social History Main Topics  . Smoking status: Never Smoker   . Smokeless tobacco: Never Used     Comment: as a teenager  . Alcohol Use: No  . Drug Use: No  . Sexually Active: Not on file     Comment: quit 40 yrs ago   Other Topics Concern  . Not on file   Social History Narrative   hh of 3  Married    Invalid son paraplegia and husband  And grandson recently     No pets             ROS: no fevers or chills, productive cough, hemoptysis, dysphasia, odynophagia, melena, hematochezia, dysuria, hematuria, rash, seizure activity, orthopnea, PND, pedal edema, claudication. Remaining systems are negative.  Physical Exam: Well-developed obese in no acute distress.  Skin is warm and dry.  HEENT is normal.  Neck is supple.  Chest is clear to auscultation with normal expansion.  Cardiovascular exam is regular rate and rhythm. 3/6 systolic murmur left sternal border radiating to the carotids. Abdominal exam nontender or distended. No masses palpated. Extremities show no edema. neuro grossly intact  ECG sinus rhythm at a rate of 73. No ST changes.

## 2012-08-21 NOTE — Assessment & Plan Note (Signed)
This is most likely multifactorial including obesity hypoventilation syndrome and deconditioning. Echocardiogram to exclude contribution from aortic stenosis. I asked her to discuss possible sleep study with her primary care physician.

## 2012-08-21 NOTE — Assessment & Plan Note (Signed)
Continue present blood pressure medications. 

## 2012-08-21 NOTE — Assessment & Plan Note (Signed)
Discussed weight loss. 

## 2012-08-21 NOTE — Addendum Note (Signed)
Addended by: Kem Parkinson on: 08/21/2012 09:43 AM   Modules accepted: Orders

## 2012-08-21 NOTE — Patient Instructions (Addendum)

## 2012-08-23 ENCOUNTER — Other Ambulatory Visit: Payer: Self-pay

## 2012-08-25 ENCOUNTER — Ambulatory Visit (HOSPITAL_COMMUNITY): Payer: Medicare Other | Attending: Cardiology

## 2012-08-25 DIAGNOSIS — E785 Hyperlipidemia, unspecified: Secondary | ICD-10-CM | POA: Insufficient documentation

## 2012-08-25 DIAGNOSIS — I10 Essential (primary) hypertension: Secondary | ICD-10-CM | POA: Diagnosis not present

## 2012-08-25 DIAGNOSIS — I359 Nonrheumatic aortic valve disorder, unspecified: Secondary | ICD-10-CM | POA: Insufficient documentation

## 2012-08-25 DIAGNOSIS — E669 Obesity, unspecified: Secondary | ICD-10-CM | POA: Insufficient documentation

## 2012-08-25 DIAGNOSIS — R0609 Other forms of dyspnea: Secondary | ICD-10-CM | POA: Insufficient documentation

## 2012-08-25 DIAGNOSIS — I25810 Atherosclerosis of coronary artery bypass graft(s) without angina pectoris: Secondary | ICD-10-CM

## 2012-08-25 DIAGNOSIS — I2510 Atherosclerotic heart disease of native coronary artery without angina pectoris: Secondary | ICD-10-CM | POA: Insufficient documentation

## 2012-08-25 DIAGNOSIS — R0989 Other specified symptoms and signs involving the circulatory and respiratory systems: Secondary | ICD-10-CM | POA: Insufficient documentation

## 2012-08-25 NOTE — Progress Notes (Signed)
Echocardiogram performed.  

## 2012-09-08 DIAGNOSIS — Z1231 Encounter for screening mammogram for malignant neoplasm of breast: Secondary | ICD-10-CM | POA: Diagnosis not present

## 2012-09-11 ENCOUNTER — Encounter: Payer: Self-pay | Admitting: Internal Medicine

## 2012-09-28 ENCOUNTER — Encounter: Payer: Self-pay | Admitting: Internal Medicine

## 2012-10-12 DIAGNOSIS — Z23 Encounter for immunization: Secondary | ICD-10-CM | POA: Diagnosis not present

## 2012-11-14 DIAGNOSIS — D239 Other benign neoplasm of skin, unspecified: Secondary | ICD-10-CM | POA: Diagnosis not present

## 2012-11-14 DIAGNOSIS — L570 Actinic keratosis: Secondary | ICD-10-CM | POA: Diagnosis not present

## 2012-11-14 DIAGNOSIS — Z85828 Personal history of other malignant neoplasm of skin: Secondary | ICD-10-CM | POA: Diagnosis not present

## 2012-11-14 DIAGNOSIS — L821 Other seborrheic keratosis: Secondary | ICD-10-CM | POA: Diagnosis not present

## 2012-11-14 DIAGNOSIS — D230 Other benign neoplasm of skin of lip: Secondary | ICD-10-CM | POA: Diagnosis not present

## 2012-11-23 ENCOUNTER — Other Ambulatory Visit: Payer: Self-pay

## 2013-01-15 ENCOUNTER — Ambulatory Visit (INDEPENDENT_AMBULATORY_CARE_PROVIDER_SITE_OTHER): Payer: Medicare Other | Admitting: Internal Medicine

## 2013-01-15 ENCOUNTER — Encounter: Payer: Self-pay | Admitting: Internal Medicine

## 2013-01-15 VITALS — BP 138/80 | HR 66 | Temp 97.7°F | Ht 62.0 in | Wt 244.0 lb

## 2013-01-15 DIAGNOSIS — R7301 Impaired fasting glucose: Secondary | ICD-10-CM | POA: Diagnosis not present

## 2013-01-15 DIAGNOSIS — Z0000 Encounter for general adult medical examination without abnormal findings: Secondary | ICD-10-CM

## 2013-01-15 DIAGNOSIS — I10 Essential (primary) hypertension: Secondary | ICD-10-CM | POA: Diagnosis not present

## 2013-01-15 DIAGNOSIS — E785 Hyperlipidemia, unspecified: Secondary | ICD-10-CM | POA: Diagnosis not present

## 2013-01-15 DIAGNOSIS — Z23 Encounter for immunization: Secondary | ICD-10-CM | POA: Diagnosis not present

## 2013-01-15 DIAGNOSIS — R1084 Generalized abdominal pain: Secondary | ICD-10-CM

## 2013-01-15 DIAGNOSIS — M159 Polyosteoarthritis, unspecified: Secondary | ICD-10-CM | POA: Diagnosis not present

## 2013-01-15 DIAGNOSIS — I359 Nonrheumatic aortic valve disorder, unspecified: Secondary | ICD-10-CM

## 2013-01-15 DIAGNOSIS — K219 Gastro-esophageal reflux disease without esophagitis: Secondary | ICD-10-CM

## 2013-01-15 DIAGNOSIS — M549 Dorsalgia, unspecified: Secondary | ICD-10-CM

## 2013-01-15 LAB — CBC WITH DIFFERENTIAL/PLATELET
Basophils Absolute: 0 10*3/uL (ref 0.0–0.1)
Basophils Relative: 0.4 % (ref 0.0–3.0)
Eosinophils Absolute: 0.1 10*3/uL (ref 0.0–0.7)
Eosinophils Relative: 2.1 % (ref 0.0–5.0)
HCT: 39.6 % (ref 36.0–46.0)
Hemoglobin: 13.4 g/dL (ref 12.0–15.0)
Lymphocytes Relative: 38.7 % (ref 12.0–46.0)
Lymphs Abs: 2.3 10*3/uL (ref 0.7–4.0)
Monocytes Absolute: 0.6 10*3/uL (ref 0.1–1.0)
Monocytes Relative: 9.8 % (ref 3.0–12.0)
Neutro Abs: 2.9 10*3/uL (ref 1.4–7.7)
Platelets: 263 10*3/uL (ref 150.0–400.0)
RBC: 4.11 Mil/uL (ref 3.87–5.11)
RDW: 13.2 % (ref 11.5–14.6)
WBC: 5.9 10*3/uL (ref 4.5–10.5)

## 2013-01-15 LAB — HEPATIC FUNCTION PANEL
ALT: 24 U/L (ref 0–35)
AST: 20 U/L (ref 0–37)
Albumin: 4.1 g/dL (ref 3.5–5.2)
Alkaline Phosphatase: 52 U/L (ref 39–117)
Bilirubin, Direct: 0.1 mg/dL (ref 0.0–0.3)
Total Protein: 6.9 g/dL (ref 6.0–8.3)

## 2013-01-15 LAB — BASIC METABOLIC PANEL
BUN: 15 mg/dL (ref 6–23)
CO2: 28 mEq/L (ref 19–32)
Calcium: 9.5 mg/dL (ref 8.4–10.5)
Chloride: 104 mEq/L (ref 96–112)
Creatinine, Ser: 0.9 mg/dL (ref 0.4–1.2)
Glucose, Bld: 101 mg/dL — ABNORMAL HIGH (ref 70–99)

## 2013-01-15 LAB — LIPID PANEL
Cholesterol: 192 mg/dL (ref 0–200)
HDL: 48.9 mg/dL (ref >39.00)
Total CHOL/HDL Ratio: 4
VLDL: 32 mg/dL (ref 0.0–40.0)

## 2013-01-15 LAB — TSH: TSH: 0.86 u[IU]/mL (ref 0.35–5.50)

## 2013-01-15 LAB — HEMOGLOBIN A1C: Hgb A1c MFr Bld: 6.4 % (ref 4.6–6.5)

## 2013-01-15 MED ORDER — ATORVASTATIN CALCIUM 40 MG PO TABS: 20.0000 mg | ORAL_TABLET | Freq: Every day | ORAL | Status: DC

## 2013-01-15 MED ORDER — ATORVASTATIN CALCIUM 40 MG PO TABS: 40.0000 mg | ORAL_TABLET | Freq: Every day | ORAL | Status: DC

## 2013-01-15 NOTE — Patient Instructions (Addendum)
Weight loss will help  Goal of 5 # weight loss  In a month and then  10 pounds.  Will be contacted about abd ultrasound. Will notify you  of labs when available. Can try change to generic lipitor  To see how you do.  ROV in 2 months for recheck of above  Or asneeded  prevnar 13 today

## 2013-01-15 NOTE — Progress Notes (Signed)
Chief Complaint  Patient presents with  . Medicare Wellness  . Hypertension  . Hyperlipidemia    HPI: Patient comes in today for Preventive Medicare wellness visit . No major injuries, ed visits ,hospitalizations , new medications since last visit.  saw dr Kinnie Scales in the spring.  fo colon and  Diverticula  Apparently ik  Over teh last 6 months having some abd discomfort htat is new and vague ? Bloated couldbe from being too fat but concerned cause family member had cancer with same sx .  Back  Pain low  So sleep  Up right  In chair  No fever fallin weight loss or ch in bowel habits  Heart m louder but apparently no sig change followed by cards. Takes care of hh  Not taking time for self no depressed . On crestor asks about gen for cost reasons but doesn't remember which ones gave ses . Only taking low dose at this time   Health Maintenance  Topic Date Due  . Pneumococcal Polysaccharide Vaccine Age 84 And Over  08/30/2002  . Zostavax  12/18/2013  . Influenza Vaccine  08/18/2013  . Mammogram  09/08/2013  . Tetanus/tdap  01/01/2020  . Colonoscopy  04/28/2022   Health Maintenance Review    Hearing:  Ok   Vision:  No limitations at present . Last eye check UTD some cataracts   Safety:  Has smoke detector and wears seat belts.  No firearms. No excess sun exposure. Sees dentist regularly.  Falls:  No   Advance directive :  Reviewed    Memory: Felt to be good  , no concern from her or her family.  Depression: No anhedonia unusual crying or depressive symptoms  Nutrition: Eats well balanced diet; adequate calcium and vitamin D. No swallowing chewing problems.  Injury: no major injuries in the last six months.  Other healthcare providers:  Reviewed today .  Social:  Lives with spouse married. No pets.  Currently hh of 4 adn some 5   Preventive parameters: up-to-date  Reviewed   ADLS:   There are no problems or need for assistance  driving, feeding, obtaining food,  dressing, toileting and bathing, managing money using phone. She is independent.  EXERCISE/ HABITS  Per week recumbent bike occassionally.   No tobacco    NO etoh   ROS:  GEN/ HEENT: No fever, significant weight changes sweats headaches vision problems hearing changes, CV/ PULM; No chest pain shortness of breath cough, syncope,edema  change in exercise tolerance. GI /GU: No adominal pain, vomiting, change in bowel habits. No blood in the stool. No significant GU symptoms. SKIN/HEME: ,no acute skin rashes suspicious lesions or bleeding. No lymphadenopathy, nodules, masses.  NEURO/ PSYCH:  No neurologic signs such as weakness numbness. No depression anxiety. IMM/ Allergy: No unusual infections.  Allergy .   REST of 12 system review negative except as per HPI   Past Medical History  Diagnosis Date  . Hypertension   . Hyperlipidemia   . Generalized osteoarthrosis, involving multiple sites   . Impaired fasting glucose   . Obesity   . GERD (gastroesophageal reflux disease)     dilitation  . Personal history of other diseases of digestive system   . Colon polyp   . Dyspnea   . PONV (postoperative nausea and vomiting)   . Coronary artery disease     aortic stenosis/mild per ECHO  . Aortic stenosis     Family History  Problem Relation Age of Onset  .  Coronary artery disease      female- 1st degree relative  . Coronary artery disease      female- 1st degree relative  . Hypertension    . Hyperlipidemia    . Heart attack Mother     age 28's  . Heart attack Father     age 97's  . Stroke Brother     died   . Other Son     paraplegia 46  . Sleep apnea Son     History   Social History  . Marital Status: Married    Spouse Name: N/A    Number of Children: N/A  . Years of Education: N/A   Occupational History  . Retired     [employer] care of [employer]   Social History Main Topics  . Smoking status: Never Smoker   . Smokeless tobacco: Never Used     Comment: as a  teenager  . Alcohol Use: No  . Drug Use: No  . Sexual Activity: None     Comment: quit 40 yrs ago   Other Topics Concern  . None   Social History Narrative   hh of 3 -5  Married    Invalid son paraplegia and husband    And grandson recently     No pets   Fluctuating hh membors she cooks and prepares for them              Outpatient Encounter Prescriptions as of 01/15/2013  Medication Sig  . aspirin 81 MG tablet Take 81 mg by mouth daily.  Marland Kitchen CALCIUM PO Take 1 tablet by mouth daily.    . cholecalciferol (VITAMIN D) 1000 UNITS tablet Take 1,000 Units by mouth daily.    Marland Kitchen losartan-hydrochlorothiazide (HYZAAR) 50-12.5 MG per tablet Take 1 tablet by mouth  daily  . metoprolol succinate (TOPROL-XL) 50 MG 24 hr tablet Take 1 tablet (50 mg total) by mouth daily. 1 tablet daily  . multivitamin (THERAGRAN) per tablet Take 1 tablet by mouth daily.   Marland Kitchen omeprazole-sodium bicarbonate (ZEGERID) 40-1100 MG per capsule Take 1 capsule by mouth daily. 1 capsule daily  . VITAMIN E PO Take 1 tablet by mouth daily.    . [DISCONTINUED] rosuvastatin (CRESTOR) 10 MG tablet Take 1/2 tablet everyday or as directed  . atorvastatin (LIPITOR) 40 MG tablet Take 0.5-1 tablets (20-40 mg total) by mouth daily.  . [DISCONTINUED] atorvastatin (LIPITOR) 40 MG tablet Take 1 tablet (40 mg total) by mouth daily.    EXAM:  BP 138/80  Pulse 66  Temp(Src) 97.7 F (36.5 C) (Oral)  Ht 5\' 2"  (1.575 m)  Wt 244 lb (110.678 kg)  BMI 44.62 kg/m2  SpO2 97%  Body mass index is 44.62 kg/(m^2). Wt Readings from Last 3 Encounters:  01/15/13 244 lb (110.678 kg)  08/21/12 247 lb (112.038 kg)  07/14/12 249 lb (112.946 kg)    Physical Exam: Vital signs reviewed [id number] is a well-developed well-nourished alert cooperative   who appears stated age in no acute distress.  HEENT: normocephalic atraumatic , Eyes: PERRL EOM's full, conjunctiva clear, Nares: paten,t no deformity discharge or tenderness., Ears: no deformity  EAC's clear TMs with normal landmarks. Mouth: clear OP, no lesions, edema.  Moist mucous membranes. Dentition in adequate repair. NECK: supple without masses, thyromegaly or bruits. CHEST/PULM:  Clear to auscultation and percussion breath sounds equal no wheeze , rales or rhonchi. No chest wall deformities or tenderness. Breast: normal by inspection . No dimpling, discharge, masses, tenderness  or discharge . CV: PMI is nondisplaced, S1 S2 no gallops, murmurs, rubs. Peripheral pulses are full without delay.No JVD .  ABDOMEN: Bowel sounds normal nontender  No guard or rebound, no hepato splenomegal no CVA tenderness.   No specific  tender are but points to sides ? No hernia. Extremtities:  No clubbing cyanosis or edema, no acute joint swelling or redness no focal atrophy independent walk uses cane  NEURO:  Oriented x3, cranial nerves 3-12 appear to be intact, no obvious focal weakness,gait within normal limits no abnormal reflexes or asymmetrical SKIN: No acute rashes normal turgor, color, no bruising or petechiae. PSYCH: Oriented, good eye contact, no obvious depression anxiety, cognition and judgment appear normal. LN: no cervical axillary inguinal adenopathy No noted deficits in memory, attention, and speech.   Lab Results  Component Value Date   WBC 5.9 01/15/2013   HGB 13.4 01/15/2013   HCT 39.6 01/15/2013   PLT 263.0 01/15/2013   GLUCOSE 101* 01/15/2013   CHOL 192 01/15/2013   TRIG 160.0* 01/15/2013   HDL 48.90 01/15/2013   LDLDIRECT 168.3 01/14/2012   LDLCALC 111* 01/15/2013   ALT 24 01/15/2013   AST 20 01/15/2013   NA 143 01/15/2013   K 4.2 01/15/2013   CL 104 01/15/2013   CREATININE 0.9 01/15/2013   BUN 15 01/15/2013   CO2 28 01/15/2013   TSH 0.86 01/15/2013   INR 1.45 02/05/2011   HGBA1C 6.4 01/15/2013    ASSESSMENT AND PLAN:  Discussed the following assessment and plan:  Medicare annual wellness visit, subsequent - Plan: HM COLONOSCOPY  Unspecified essential  hypertension - Plan: HM COLONOSCOPY, Basic metabolic panel, CBC with Differential, Hemoglobin A1c, Hepatic function panel, Lipid panel, TSH  Other and unspecified hyperlipidemia - doesnt remember se of which statin ? try generic can try gen lipitor  - Plan: HM COLONOSCOPY, Basic metabolic panel, CBC with Differential, Hemoglobin A1c, Hepatic function panel, Lipid panel, TSH  Impaired fasting glucose - Plan: HM COLONOSCOPY, Basic metabolic panel, CBC with Differential, Hemoglobin A1c, Hepatic function panel, Lipid panel, TSH  Generalized osteoarthrosis, involving multiple sites - Plan: HM COLONOSCOPY, Basic metabolic panel, CBC with Differential, Hemoglobin A1c, Hepatic function panel, Lipid panel, TSH  GERD (gastroesophageal reflux disease) - Plan: HM COLONOSCOPY, Basic metabolic panel, CBC with Differential, Hemoglobin A1c, Hepatic function panel, Lipid panel, TSH  Aortic valve disorders - Plan: Basic metabolic panel, CBC with Differential, Hemoglobin A1c, Hepatic function panel, Lipid panel, TSH  Generalized abdominal discomfort - for 6 months off and on vague  right and left no weight loss fever uti sx  - Plan: Basic metabolic panel, CBC with Differential, Hemoglobin A1c, Hepatic function panel, Lipid panel, TSH, US Abdomen Complete  Back pain - sleeping in chair to help  - Plan: Basic metabolic panel, CBC with Differential, Hemoglobin A1c, Hepatic function panel, Lipid panel, TSH, US Abdomen Complete  Need for vaccination with 13-polyvalent pneumococcal conjugate vaccine - Plan: Pneumococcal conjugate vaccine 13-valent  Generalized abdominal discomfort - Plan: Basic metabolic panel, CBC with Differential, Hemoglobin A1c, Hepatic function panel, Lipid panel, TSH, US Abdomen Complete  Back pain - Plan: Basic metabolic panel, CBC with Differential, Hemoglobin A1c, Hepatic function panel, Lipid panel, TSH, US Abdomen Complete  Overall new onset of vague abdominal symptoms back pain is a bit  worse but could be related to her obesity.  Abdominal ultrasound labs today ;followup in a few months to try generic Lipitor at half to 1/40 uncertain if she had side effects of this  medicine will follow. Discussed weight loss short term goals would be helpful Declined shingles vaccine had Pneumovax in 2002 get a Prevnar 13 today. Patient Care Team: [name], MD as PCP - General Nyoka Cowden, MD (Pulmonary Disease) Nestor Lewandowsky, MD (Orthopedic Surgery) Lunette Stands, MD (Orthopedic Surgery) Griffith Citron, MD (Gastroenterology) Lewayne Bunting, MD as Attending Physician (Cardiology) [facility])  Patient Instructions  Weight loss will help  Goal of 5 # weight loss  In a month and then  10 pounds.  Will be contacted about abd ultrasound. Will notify you  of labs when available. Can try change to generic lipitor  To see how you do.  ROV in 2 months for recheck of above  Or asneeded  prevnar 13 today     Wanda K. [name] M.D. Pre visit review using our clinic review tool, if applicable. No additional management support is needed unless otherwise documented below in the visit note.

## 2013-01-19 ENCOUNTER — Ambulatory Visit
Admission: RE | Admit: 2013-01-19 | Discharge: 2013-01-19 | Disposition: A | Discharge status: Home / Self Care | Payer: Medicare Other | Source: Ambulatory Visit | Attending: Internal Medicine | Admitting: Internal Medicine

## 2013-01-19 DIAGNOSIS — R1084 Generalized abdominal pain: Secondary | ICD-10-CM

## 2013-01-19 DIAGNOSIS — R109 Unspecified abdominal pain: Secondary | ICD-10-CM | POA: Diagnosis not present

## 2013-01-19 DIAGNOSIS — M549 Dorsalgia, unspecified: Secondary | ICD-10-CM

## 2013-01-19 NOTE — Progress Notes (Signed)
Quick Note:  Tell patient that Ultrasound is normal ______

## 2013-01-22 DIAGNOSIS — S93609A Unspecified sprain of unspecified foot, initial encounter: Secondary | ICD-10-CM | POA: Diagnosis not present

## 2013-03-19 ENCOUNTER — Encounter: Payer: Self-pay | Admitting: Internal Medicine

## 2013-03-19 ENCOUNTER — Ambulatory Visit (INDEPENDENT_AMBULATORY_CARE_PROVIDER_SITE_OTHER): Payer: Medicare Other | Admitting: Internal Medicine

## 2013-03-19 VITALS — BP 150/80 | HR 70 | Temp 97.9°F | Ht 62.0 in | Wt 248.0 lb

## 2013-03-19 DIAGNOSIS — E669 Obesity, unspecified: Secondary | ICD-10-CM

## 2013-03-19 DIAGNOSIS — E785 Hyperlipidemia, unspecified: Secondary | ICD-10-CM

## 2013-03-19 DIAGNOSIS — R1011 Right upper quadrant pain: Secondary | ICD-10-CM

## 2013-03-19 DIAGNOSIS — I359 Nonrheumatic aortic valve disorder, unspecified: Secondary | ICD-10-CM

## 2013-03-19 DIAGNOSIS — I10 Essential (primary) hypertension: Secondary | ICD-10-CM

## 2013-03-19 LAB — BASIC METABOLIC PANEL
BUN: 16 mg/dL (ref 6–23)
CHLORIDE: 104 meq/L (ref 96–112)
CO2: 32 mEq/L (ref 19–32)
Calcium: 9.5 mg/dL (ref 8.4–10.5)
Creatinine, Ser: 0.8 mg/dL (ref 0.4–1.2)
GFR: 76.41 mL/min (ref >60.00)
Glucose, Bld: 113 mg/dL — ABNORMAL HIGH (ref 70–99)
Potassium: 4.2 mEq/L (ref 3.5–5.1)
Sodium: 140 mEq/L (ref 135–145)

## 2013-03-19 LAB — POCT URINALYSIS DIP (MANUAL ENTRY)
BILIRUBIN UA: NEGATIVE
Bilirubin, UA: NEGATIVE
GLUCOSE UA: NEGATIVE
Nitrite, UA: NEGATIVE
Protein Ur, POC: NEGATIVE
RBC UA: NEGATIVE
SPEC GRAV UA: 1.02
UROBILINOGEN UA: 0.2
pH, UA: 6.5

## 2013-03-19 LAB — LIPID PANEL
CHOL/HDL RATIO: 3
CHOLESTEROL: 164 mg/dL (ref 0–200)
HDL: 49.3 mg/dL (ref >39.00)
TRIGLYCERIDES: 233 mg/dL — AB (ref 0.0–149.0)
VLDL: 46.6 mg/dL — ABNORMAL HIGH (ref 0.0–40.0)

## 2013-03-19 LAB — LDL CHOLESTEROL, DIRECT: Direct LDL: 96.6 mg/dL

## 2013-03-19 MED ORDER — ROSUVASTATIN CALCIUM 10 MG PO TABS: 10.0000 mg | ORAL_TABLET | Freq: Every day | ORAL | Status: DC

## 2013-03-19 NOTE — Patient Instructions (Addendum)
Will chang back to crestor  And send in rx.  Will arrange ct scan of abdomen and pelvis .  Then plan fu and p[lan to see dr Earlean Shawl depending on results .   Recheck lipid panel in 3-4 months and rov  Check bp readings to make sure in range .

## 2013-03-19 NOTE — Progress Notes (Signed)
Chief Complaint  Patient presents with  . Follow-up    Needs to change her Lipitor.  It is no longer on her formulary and it is causing joint pain.    HPI: FU lipids abd [place] was nl including liver  Pain in side is the concern to her lay down and the arising  At times   Not a hard pain but new and concerning .   Has back pain not the same .5/10 more worrisone to her  Radiating down to med abd at times .  No nvd with this .   Hx of henmorrhoid procedure  last summer.  Bowel urgency some fecal incontinence.   lipitor causing some sx and not on formulary  Asks to go back to crestor at this time even though has some sx .   AS told no intervention at last  Echo .  Yearly cards check no syncope dizziness '  Always SOBand ocass cough  Denies a change   Balance an issues poss from tendon problems in legs no change   ROS: See pertinent positives and negatives per HPI.no fever uti sx   Past Medical History  Diagnosis Date  . Hypertension   . Hyperlipidemia   . Generalized osteoarthrosis, involving multiple sites   . Impaired fasting glucose   . Obesity   . GERD (gastroesophageal reflux disease)     dilitation  . Personal history of other diseases of digestive system   . Colon polyp   . Dyspnea   . PONV (postoperative nausea and vomiting)   . Coronary artery disease     aortic stenosis/mild per ECHO  . Aortic stenosis     Family History  Problem Relation Age of Onset  . Coronary artery disease      female- 1st degree relative  . Coronary artery disease      female- 1st degree relative  . Hypertension    . Hyperlipidemia    . Heart attack Mother     age 11's  . Heart attack Father     age 28's  . Stroke Brother     died   . Other Son     paraplegia 14  . Sleep apnea Son     History   Social History  . Marital Status: Married    Spouse Name: N/A    Number of Children: N/A  . Years of Education: N/A   Occupational History  . Retired     [employer] care of  [employer]   Social History Main Topics  . Smoking status: Never Smoker   . Smokeless tobacco: Never Used     Comment: as a teenager  . Alcohol Use: No  . Drug Use: No  . Sexual Activity: None     Comment: quit 40 yrs ago   Other Topics Concern  . None   Social History Narrative   hh of 3 -5  Married    Invalid son paraplegia and husband    And grandson recently     No pets   Fluctuating hh membors she cooks and prepares for them              Outpatient Encounter Prescriptions as of 03/19/2013  Medication Sig  . aspirin 81 MG tablet Take 81 mg by mouth daily.  Marland Kitchen CALCIUM PO Take 1 tablet by mouth daily.    . cholecalciferol (VITAMIN D) 1000 UNITS tablet Take 1,000 Units by mouth daily.    Marland Kitchen  losartan-hydrochlorothiazide (HYZAAR) 50-12.5 MG per tablet Take 1 tablet by mouth  daily  . metoprolol succinate (TOPROL-XL) 50 MG 24 hr tablet Take 1 tablet (50 mg total) by mouth daily. 1 tablet daily  . multivitamin (THERAGRAN) per tablet Take 1 tablet by mouth daily.   Marland Kitchen omeprazole-sodium bicarbonate (ZEGERID) 40-1100 MG per capsule Take 1 capsule by mouth daily. 1 capsule daily  . VITAMIN E PO Take 1 tablet by mouth daily.    . [DISCONTINUED] atorvastatin (LIPITOR) 40 MG tablet Take 0.5-1 tablets (20-40 mg total) by mouth daily.  . rosuvastatin (CRESTOR) 10 MG tablet Take 1 tablet (10 mg total) by mouth daily.    EXAM:  BP 150/80  Pulse 70  Temp(Src) 97.9 F (36.6 C) (Oral)  Ht 5\' 2"  (1.575 m)  Wt 248 lb (112.492 kg)  BMI 45.35 kg/m2  SpO2 98%  Body mass index is 45.35 kg/(m^2).  GENERAL: vitals reviewed and listed above, alert, oriented, appears well hydrated and in no acute distress  HEENT: atraumatic, conjunctiva  clear, no obvious abnormalities on inspection of external nose and ears  NECK: no obvious masses on inspection palpation no jvs radiated murmur heard  LUNGS: clear to auscultation bilaterally, no wheezes, rales or rhonchi,   CV: HRRR, no clubbing  cyanosis or nl cap refill  3/6 systolic m throught chest no ggallot heard  Abdomen:  Sof,t large normal bowel sounds without hepatosplenomegaly, no guarding rebound or masses  Fading bruise superficial mid abd no CVA tenderness MS: moves all extremities  Walks independent but has cane  PSYCH: pleasant and cooperative, no obvious depression or anxiety Lab Results  Component Value Date   WBC 5.9 01/15/2013   HGB 13.4 01/15/2013   HCT 39.6 01/15/2013   PLT 263.0 01/15/2013   GLUCOSE 101* 01/15/2013   CHOL 192 01/15/2013   TRIG 160.0* 01/15/2013   HDL 48.90 01/15/2013   LDLDIRECT 168.3 01/14/2012   LDLCALC 111* 01/15/2013   ALT 24 01/15/2013   AST 20 01/15/2013   NA 143 01/15/2013   K 4.2 01/15/2013   CL 104 01/15/2013   CREATININE 0.9 01/15/2013   BUN 15 01/15/2013   CO2 28 01/15/2013   TSH 0.86 01/15/2013   INR 1.45 02/05/2011   HGBA1C 6.4 01/15/2013    ASSESSMENT AND PLAN:  Discussed the following assessment and plan:  Abdominal pain, right upper quadrant - uncertain cause newer [place] good  - Plan: Basic metabolic panel, Lipid panel, Hepatitis C antibody, POCT urinalysis dipstick, CT Abdomen Pelvis W Contrast  Other and unspecified hyperlipidemia - se of meds formulary isssues go back to crestor - Plan: Basic metabolic panel, Lipid panel, Hepatitis C antibody, POCT urinalysis dipstick  Unspecified essential hypertension - up today some check at home to ensure control - Plan: Basic metabolic panel, Lipid panel, Hepatitis C antibody, POCT urinalysis dipstick  Aortic valve disorders - Plan: Basic metabolic panel, Lipid panel, Hepatitis C antibody, POCT urinalysis dipstick  OBESITY  -Patient advised to return or notify health care team  if symptoms worsen or persist or new concerns arise.  Patient Instructions  Will chang back to crestor  And send in rx.  Will arrange ct scan of abdomen and pelvis .  Then plan fu and p[lan to see dr Earlean Shawl depending on results .   Recheck  lipid panel in 3-4 months and rov  Check bp readings to make sure in range .   [name]. Sterling Mondo M.D.  Pre visit review using our clinic review tool,  if applicable. No additional management support is needed unless otherwise documented below in the visit note.

## 2013-03-20 ENCOUNTER — Telehealth: Payer: Self-pay | Admitting: Internal Medicine

## 2013-03-20 LAB — HEPATITIS C ANTIBODY: HCV Ab: NEGATIVE

## 2013-03-20 NOTE — Telephone Encounter (Signed)
Relevant patient education assigned to patient using Emmi. ° °

## 2013-03-21 ENCOUNTER — Other Ambulatory Visit: Payer: Self-pay | Admitting: Cardiology

## 2013-03-26 ENCOUNTER — Ambulatory Visit (INDEPENDENT_AMBULATORY_CARE_PROVIDER_SITE_OTHER)
Admission: RE | Admit: 2013-03-26 | Discharge: 2013-03-26 | Disposition: A | Discharge status: Home / Self Care | Payer: Medicare Other | Source: Ambulatory Visit | Attending: Internal Medicine | Admitting: Internal Medicine

## 2013-03-26 DIAGNOSIS — R1011 Right upper quadrant pain: Secondary | ICD-10-CM

## 2013-03-26 DIAGNOSIS — K5730 Diverticulosis of large intestine without perforation or abscess without bleeding: Secondary | ICD-10-CM | POA: Diagnosis not present

## 2013-03-26 MED ORDER — IOHEXOL 300 MG/ML  SOLN
100.0000 mL | Freq: Once | INTRAMUSCULAR | Status: AC | PRN
  Administered 2013-03-26: 100 mL via INTRAVENOUS

## 2013-06-06 DIAGNOSIS — M5137 Other intervertebral disc degeneration, lumbosacral region: Secondary | ICD-10-CM | POA: Diagnosis not present

## 2013-06-06 DIAGNOSIS — M25559 Pain in unspecified hip: Secondary | ICD-10-CM | POA: Diagnosis not present

## 2013-06-06 DIAGNOSIS — IMO0002 Reserved for concepts with insufficient information to code with codable children: Secondary | ICD-10-CM | POA: Diagnosis not present

## 2013-06-07 ENCOUNTER — Other Ambulatory Visit: Payer: Self-pay

## 2013-06-07 MED ORDER — LOSARTAN POTASSIUM-HCTZ 50-12.5 MG PO TABS: ORAL_TABLET | ORAL | Status: DC

## 2013-06-14 ENCOUNTER — Other Ambulatory Visit: Payer: Self-pay | Admitting: Orthopedic Surgery

## 2013-06-14 DIAGNOSIS — M545 Low back pain, unspecified: Secondary | ICD-10-CM

## 2013-06-24 ENCOUNTER — Other Ambulatory Visit: Payer: Medicare Other

## 2013-06-26 ENCOUNTER — Other Ambulatory Visit: Payer: Medicare Other

## 2013-06-27 ENCOUNTER — Ambulatory Visit
Admission: RE | Admit: 2013-06-27 | Discharge: 2013-06-27 | Disposition: A | Discharge status: Home / Self Care | Payer: Medicare Other | Source: Ambulatory Visit | Attending: Orthopedic Surgery | Admitting: Orthopedic Surgery

## 2013-06-27 DIAGNOSIS — M545 Low back pain, unspecified: Secondary | ICD-10-CM

## 2013-06-27 DIAGNOSIS — M5126 Other intervertebral disc displacement, lumbar region: Secondary | ICD-10-CM | POA: Diagnosis not present

## 2013-06-27 DIAGNOSIS — M47817 Spondylosis without myelopathy or radiculopathy, lumbosacral region: Secondary | ICD-10-CM | POA: Diagnosis not present

## 2013-06-28 DIAGNOSIS — Z6841 Body Mass Index (BMI) 40.0 and over, adult: Secondary | ICD-10-CM | POA: Diagnosis not present

## 2013-06-28 DIAGNOSIS — Q762 Congenital spondylolisthesis: Secondary | ICD-10-CM | POA: Diagnosis not present

## 2013-06-28 DIAGNOSIS — M545 Low back pain, unspecified: Secondary | ICD-10-CM | POA: Diagnosis not present

## 2013-06-28 DIAGNOSIS — IMO0002 Reserved for concepts with insufficient information to code with codable children: Secondary | ICD-10-CM | POA: Diagnosis not present

## 2013-07-13 ENCOUNTER — Other Ambulatory Visit: Payer: Self-pay | Admitting: Cardiology

## 2013-07-19 DIAGNOSIS — M5126 Other intervertebral disc displacement, lumbar region: Secondary | ICD-10-CM | POA: Diagnosis not present

## 2013-07-19 DIAGNOSIS — M545 Low back pain, unspecified: Secondary | ICD-10-CM | POA: Diagnosis not present

## 2013-07-19 DIAGNOSIS — Q762 Congenital spondylolisthesis: Secondary | ICD-10-CM | POA: Diagnosis not present

## 2013-07-24 ENCOUNTER — Other Ambulatory Visit (INDEPENDENT_AMBULATORY_CARE_PROVIDER_SITE_OTHER): Payer: Medicare Other

## 2013-07-24 DIAGNOSIS — E785 Hyperlipidemia, unspecified: Secondary | ICD-10-CM

## 2013-07-24 LAB — LIPID PANEL
Cholesterol: 182 mg/dL (ref 0–200)
HDL: 52 mg/dL (ref >39.00)
LDL Cholesterol: 85 mg/dL (ref 0–99)
NonHDL: 130
TRIGLYCERIDES: 226 mg/dL — AB (ref 0.0–149.0)
Total CHOL/HDL Ratio: 4
VLDL: 45.2 mg/dL — ABNORMAL HIGH (ref 0.0–40.0)

## 2013-07-30 ENCOUNTER — Ambulatory Visit (INDEPENDENT_AMBULATORY_CARE_PROVIDER_SITE_OTHER): Payer: Medicare Other | Admitting: Internal Medicine

## 2013-07-30 ENCOUNTER — Encounter: Payer: Self-pay | Admitting: Internal Medicine

## 2013-07-30 VITALS — BP 148/70 | Temp 98.1°F | Ht 62.0 in | Wt 243.0 lb

## 2013-07-30 DIAGNOSIS — I359 Nonrheumatic aortic valve disorder, unspecified: Secondary | ICD-10-CM

## 2013-07-30 DIAGNOSIS — R7309 Other abnormal glucose: Secondary | ICD-10-CM | POA: Diagnosis not present

## 2013-07-30 DIAGNOSIS — E669 Obesity, unspecified: Secondary | ICD-10-CM | POA: Diagnosis not present

## 2013-07-30 DIAGNOSIS — E785 Hyperlipidemia, unspecified: Secondary | ICD-10-CM

## 2013-07-30 DIAGNOSIS — M5430 Sciatica, unspecified side: Secondary | ICD-10-CM

## 2013-07-30 DIAGNOSIS — M5441 Lumbago with sciatica, right side: Secondary | ICD-10-CM

## 2013-07-30 DIAGNOSIS — I10 Essential (primary) hypertension: Secondary | ICD-10-CM | POA: Diagnosis not present

## 2013-07-30 NOTE — Patient Instructions (Addendum)
Lipids ok  Continue weight loss  And life style interventions  No sugars sweets and and processed carbs.   Wt Readings from Last 3 Encounters:  07/30/13 243 lb (110.224 kg)  03/19/13 248 lb (112.492 kg)  01/15/13 244 lb (110.678 kg)   Bp:  Is up today in office . Monitoring  Eating and beverages  Contact us if increasing .  Wellness  Exam .  End of December and January.  You are prediabetic and could get diabetes   But eating better and continuing to lose weight .  Will help blood sugar and also your back .

## 2013-07-30 NOTE — Progress Notes (Signed)
Pre visit review using our clinic review tool, if applicable. No additional management support is needed unless otherwise documented below in the visit note.  Chief Complaint  Patient presents with  . Follow-up    HPI: Fu multiple med issues LIPIDS back on crestor again  1qod  10 mg  When takking reg  Muscles ache.  BP  :   ? Has been ok   Hurts to take with machine so thinks up cause of this  Had nl one 120 when had back injection GI: No change  Has had back and sciatica right  Anna Gamble  Better after shot  Weight aware of needing to lose and avoid sugars  Son had dm pre and lost 85 pound with phone app.  ROS: See pertinent positives and negatives per HPI. hadcough better afetr shot for back  reflucx med nocp sob  Sees card in august   Past Medical History  Diagnosis Date  . Hypertension   . Hyperlipidemia   . Generalized osteoarthrosis, involving multiple sites   . Impaired fasting glucose   . Obesity   . GERD (gastroesophageal reflux disease)     dilitation  . Personal history of other diseases of digestive system   . Colon polyp   . Dyspnea   . PONV (postoperative nausea and vomiting)   . Coronary artery disease     aortic stenosis/mild per ECHO  . Aortic stenosis     Family History  Problem Relation Age of Onset  . Coronary artery disease      female- 1st degree relative  . Coronary artery disease      female- 1st degree relative  . Hypertension    . Hyperlipidemia    . Heart attack Mother     age 24's  . Heart attack Father     age 76's  . Stroke Brother     died   . Other Son     paraplegia 72  . Sleep apnea Son     History   Social History  . Marital Status: Married    Spouse Name: N/A    Number of Children: N/A  . Years of Education: N/A   Occupational History  . Retired     [employer] of [employer]   Social History Main Topics  . Smoking status: Never Smoker   . Smokeless tobacco: Never Used     Comment: as a teenager  . Alcohol Use:  No  . Drug Use: No  . Sexual Activity: None     Comment: quit 40 yrs ago   Other Topics Concern  . None   Social History Narrative   hh of 3 -5  Married    Invalid son paraplegia and husband    And grandson recently     No pets   Fluctuating hh membors she cooks and prepares for them              Outpatient Encounter Prescriptions as of 07/30/2013  Medication Sig  . aspirin 81 MG tablet Take 81 mg by mouth daily.  Marland Kitchen CALCIUM PO Take 1 tablet by mouth daily.    . cholecalciferol (VITAMIN D) 1000 UNITS tablet Take 1,000 Units by mouth daily.    Marland Kitchen losartan-hydrochlorothiazide (HYZAAR) 50-12.5 MG per tablet Take 1 tablet by mouth   daily  . metoprolol succinate (TOPROL-XL) 50 MG 24 hr tablet Take 1 tablet by mouth  daily  . multivitamin (THERAGRAN) per tablet Take 1 tablet by mouth  daily.   . omeprazole-sodium bicarbonate (ZEGERID) 40-1100 MG per capsule Take 1 capsule by mouth daily. 1 capsule daily  . rosuvastatin (CRESTOR) 10 MG tablet Take 1 tablet (10 mg total) by mouth daily.  Marland Kitchen VITAMIN E PO Take 1 tablet by mouth daily.      EXAM:  BP 148/70  Temp(Src) 98.1 F (36.7 C) (Oral)  Ht 5\' 2"  (1.575 m)  Wt 243 lb (110.224 kg)  BMI 44.43 kg/m2  Body mass index is 44.43 kg/(m^2).  GENERAL: vitals reviewed and listed above, alert, oriented, appears well hydrated and in no acute distress HEENT: atraumatic, conjunctiva  clear, no obvious abnormalities on inspection of external nose and ears NECK: no obvious masses on inspection palpation  LUNGS: clear to auscultation bilaterally, no wheezes, rales or rhonchi,  CV: HRRR, high pitched 2-3/6 sem upper chest  bitlaterally  no clubbing cyanosis or  peripheral edema nl cap refill  MS: moves all extremities  Abnormality walks with cane unassisted otherwise  PSYCH: pleasant and cooperative, no obvious depression  minimal anxiety Lab Results  Component Value Date   WBC 5.9 01/15/2013   HGB 13.4 01/15/2013   HCT 39.6 01/15/2013   PLT  263.0 01/15/2013   GLUCOSE 113* 03/19/2013   CHOL 182 07/24/2013   TRIG 226.0* 07/24/2013   HDL 52.00 07/24/2013   LDLDIRECT 96.6 03/19/2013   LDLCALC 85 07/24/2013   ALT 24 01/15/2013   AST 20 01/15/2013   NA 140 03/19/2013   K 4.2 03/19/2013   CL 104 03/19/2013   CREATININE 0.8 03/19/2013   BUN 16 03/19/2013   CO2 32 03/19/2013   TSH 0.86 01/15/2013   INR 1.45 02/05/2011   HGBA1C 6.4 01/15/2013   Wt Readings from Last 3 Encounters:  07/30/13 243 lb (110.224 kg)  03/19/13 248 lb (112.492 kg)  01/15/13 244 lb (110.678 kg)    ASSESSMENT AND PLAN:  Discussed the following assessment and plan:  Other and unspecified hyperlipidemia - taking qod 10 crestor   Unspecified essential hypertension - up in office as AVD says better out of office has appt with dr [name] in august  monitor   Obesity, unspecified - weight loss will help says she knows what to do but cooks for every one  Other abnormal glucose - prediabeteic   lsi at this time   Right-sided low back pain with right-sided sciatica - better with shots  poer dr Lynann Bologna  Aortic valve disorders  -Patient advised to return or notify health care team  if symptoms worsen ,persist or new concerns arise.  Patient Instructions   Lipids ok  Continue weight loss  And life style interventions  No sugars sweets and and processed carbs.   Wt Readings from Last 3 Encounters:  07/30/13 243 lb (110.224 kg)  03/19/13 248 lb (112.492 kg)  01/15/13 244 lb (110.678 kg)   Bp:  Is up today in office . Monitoring  Eating and beverages  Contact us if increasing .  Wellness  Exam .  End of December and January.  You are prediabetic and could get diabetes   But eating better and continuing to lose weight .  Will help blood sugar and also your back .     [name]. [name] M.D.  Total visit 23mins > 50% spent counseling and coordinating care

## 2013-09-10 DIAGNOSIS — Z1231 Encounter for screening mammogram for malignant neoplasm of breast: Secondary | ICD-10-CM | POA: Diagnosis not present

## 2013-09-10 DIAGNOSIS — Z803 Family history of malignant neoplasm of breast: Secondary | ICD-10-CM | POA: Diagnosis not present

## 2013-09-10 LAB — HM MAMMOGRAPHY

## 2013-09-11 ENCOUNTER — Encounter: Payer: Self-pay | Admitting: Internal Medicine

## 2013-09-12 ENCOUNTER — Encounter: Payer: Self-pay | Admitting: *Deleted

## 2013-09-12 ENCOUNTER — Encounter: Payer: Self-pay | Admitting: Cardiology

## 2013-09-12 ENCOUNTER — Ambulatory Visit (INDEPENDENT_AMBULATORY_CARE_PROVIDER_SITE_OTHER): Payer: Medicare Other | Admitting: Cardiology

## 2013-09-12 VITALS — BP 130/60 | HR 68 | Ht 62.0 in | Wt 241.6 lb

## 2013-09-12 DIAGNOSIS — I359 Nonrheumatic aortic valve disorder, unspecified: Secondary | ICD-10-CM | POA: Diagnosis not present

## 2013-09-12 DIAGNOSIS — I10 Essential (primary) hypertension: Secondary | ICD-10-CM

## 2013-09-12 DIAGNOSIS — R079 Chest pain, unspecified: Secondary | ICD-10-CM | POA: Diagnosis not present

## 2013-09-12 DIAGNOSIS — R0602 Shortness of breath: Secondary | ICD-10-CM

## 2013-09-12 NOTE — Assessment & Plan Note (Signed)
Continue present blood pressure medications. 

## 2013-09-12 NOTE — Assessment & Plan Note (Signed)
Plan repeat echocardiogram. 

## 2013-09-12 NOTE — Patient Instructions (Signed)
Your physician wants you to follow-up in: [place] will receive a reminder letter in the mail two months in advance. If you don't receive a letter, please call our office to schedule the follow-up appointment.   Your physician has requested that you have an echocardiogram. Echocardiography is a painless test that uses sound waves to create images of your heart. It provides your doctor with information about the size and shape of your heart and how well your heart's chambers and valves are working. This procedure takes approximately one hour. There are no restrictions for this procedure.   Your physician has requested that you have a lexiscan myoview. For further information please visit [email or web address]. Please follow instruction sheet, as given.

## 2013-09-12 NOTE — Assessment & Plan Note (Signed)
This is most likely multifactorial including obesity hypoventilation syndrome and deconditioning. Echocardiogram to exclude contribution from aortic stenosis.

## 2013-09-12 NOTE — Assessment & Plan Note (Signed)
Symptoms somewhat atypical. Schedule nuclear study for risk stratification.

## 2013-09-12 NOTE — Progress Notes (Signed)
HPI: FU aortic stenosis. She did have a Myoview performed in June 2013 that showed an ejection fraction of 72% and normal perfusion. Echocardiogram in August 2014 showed normal LV function, mild aortic stenosis with a mean gradient of 18 mmHg, mild MR, mild LAE. Placed on beta blocker previously secondary to PVCs. Since I last saw her in August of 2014, She does have dyspnea on exertion which is chronic. No orthopnea or pedal edema. She occasionally feels chest tightness. This last several minutes and resolve spontaneously. Can occur both with exertion and at rest. No radiation. No associated symptoms.   Current Outpatient Prescriptions  Medication Sig Dispense Refill  . aspirin 81 MG tablet Take 81 mg by mouth daily.      Marland Kitchen CALCIUM PO Take 1 tablet by mouth daily.        . cholecalciferol (VITAMIN D) 1000 UNITS tablet Take 1,000 Units by mouth daily.        Marland Kitchen losartan-hydrochlorothiazide (HYZAAR) 50-12.5 MG per tablet Take 1 tablet by mouth   daily  90 tablet  1  . metoprolol succinate (TOPROL-XL) 50 MG 24 hr tablet Take 1 tablet by mouth  daily  90 tablet  0  . multivitamin (THERAGRAN) per tablet Take 1 tablet by mouth daily.       Marland Kitchen omeprazole-sodium bicarbonate (ZEGERID) 40-1100 MG per capsule Take 1 capsule by mouth daily. 1 capsule daily      . rosuvastatin (CRESTOR) 10 MG tablet Take 1 tablet (10 mg total) by mouth daily.  90 tablet  3  . VITAMIN E PO Take 1 tablet by mouth daily.         No current facility-administered medications for this visit.     Past Medical History  Diagnosis Date  . Hypertension   . Hyperlipidemia   . Generalized osteoarthrosis, involving multiple sites   . Impaired fasting glucose   . Obesity   . GERD (gastroesophageal reflux disease)     dilitation  . Personal history of other diseases of digestive system   . Colon polyp   . Dyspnea   . PONV (postoperative nausea and vomiting)   . Coronary artery disease     aortic stenosis/mild per ECHO    . Aortic stenosis     Past Surgical History  Procedure Laterality Date  . Appendectomy    . Cholecystectomy    . Knee surgery      rt  . Foot surgery      rt  . Tonsillectomy    . Total knee arthroplasty  02/01/2011    Procedure: TOTAL KNEE ARTHROPLASTY;  Surgeon: Kerin Salen;  Location: [facility];  Service: Orthopedics;  Laterality: Right;  DEPUY/SIGMA    History   Social History  . Marital Status: Married    Spouse Name: N/A    Number of Children: N/A  . Years of Education: N/A   Occupational History  . Retired     [employer] of [employer]   Social History Main Topics  . Smoking status: Never Smoker   . Smokeless tobacco: Never Used     Comment: as a teenager  . Alcohol Use: No  . Drug Use: No  . Sexual Activity: Not on file     Comment: quit 40 yrs ago   Other Topics Concern  . Not on file   Social History Narrative   hh of 3 -5  Married    Invalid son paraplegia and husband  And grandson recently     No pets   Fluctuating hh membors she cooks and prepares for them              ROS: no fevers or chills, productive cough, hemoptysis, dysphasia, odynophagia, melena, hematochezia, dysuria, hematuria, rash, seizure activity, orthopnea, PND, pedal edema, claudication. Remaining systems are negative.  Physical Exam: Well-developed obese in no acute distress.  Skin is warm and dry.  HEENT is normal.  Neck is supple.  Chest is clear to auscultation with normal expansion.  Cardiovascular exam is regular rate and rhythm. 3/6 systolic murmur LSB Abdominal exam nontender or distended. No masses palpated. Extremities show no edema. neuro grossly intact  ECG Sinus rhythm, no ST changes.

## 2013-09-12 NOTE — Assessment & Plan Note (Signed)
Continue statin. 

## 2013-09-13 DIAGNOSIS — R920 Mammographic microcalcification found on diagnostic imaging of breast: Secondary | ICD-10-CM | POA: Diagnosis not present

## 2013-09-13 DIAGNOSIS — R928 Other abnormal and inconclusive findings on diagnostic imaging of breast: Secondary | ICD-10-CM | POA: Diagnosis not present

## 2013-09-27 ENCOUNTER — Telehealth (HOSPITAL_COMMUNITY): Payer: Self-pay

## 2013-09-27 ENCOUNTER — Encounter: Payer: Self-pay | Admitting: Internal Medicine

## 2013-09-27 NOTE — Telephone Encounter (Signed)
Encounter complete. 

## 2013-10-02 ENCOUNTER — Ambulatory Visit (HOSPITAL_COMMUNITY)
Admission: RE | Admit: 2013-10-02 | Discharge: 2013-10-02 | Disposition: A | Discharge status: Home / Self Care | Payer: Medicare Other | Source: Ambulatory Visit | Attending: Internal Medicine | Admitting: Internal Medicine

## 2013-10-02 ENCOUNTER — Ambulatory Visit (HOSPITAL_BASED_OUTPATIENT_CLINIC_OR_DEPARTMENT_OTHER)
Admission: RE | Admit: 2013-10-02 | Discharge: 2013-10-02 | Disposition: A | Discharge status: Home / Self Care | Payer: Medicare Other | Source: Ambulatory Visit | Attending: Internal Medicine | Admitting: Internal Medicine

## 2013-10-02 DIAGNOSIS — I359 Nonrheumatic aortic valve disorder, unspecified: Secondary | ICD-10-CM | POA: Insufficient documentation

## 2013-10-02 DIAGNOSIS — K219 Gastro-esophageal reflux disease without esophagitis: Secondary | ICD-10-CM | POA: Diagnosis not present

## 2013-10-02 DIAGNOSIS — R0609 Other forms of dyspnea: Secondary | ICD-10-CM | POA: Insufficient documentation

## 2013-10-02 DIAGNOSIS — R0989 Other specified symptoms and signs involving the circulatory and respiratory systems: Secondary | ICD-10-CM | POA: Diagnosis not present

## 2013-10-02 DIAGNOSIS — R079 Chest pain, unspecified: Secondary | ICD-10-CM

## 2013-10-02 DIAGNOSIS — E785 Hyperlipidemia, unspecified: Secondary | ICD-10-CM | POA: Insufficient documentation

## 2013-10-02 MED ORDER — AMINOPHYLLINE 25 MG/ML IV SOLN
75.0000 mg | Freq: Once | INTRAVENOUS | Status: AC
  Administered 2013-10-02: 75 mg via INTRAVENOUS

## 2013-10-02 MED ORDER — TECHNETIUM TC 99M SESTAMIBI GENERIC - CARDIOLITE
30.0000 | Freq: Once | INTRAVENOUS | Status: AC | PRN
  Administered 2013-10-02: 30 via INTRAVENOUS

## 2013-10-02 MED ORDER — REGADENOSON 0.4 MG/5ML IV SOLN
0.4000 mg | Freq: Once | INTRAVENOUS | Status: AC
  Administered 2013-10-02: 0.4 mg via INTRAVENOUS

## 2013-10-02 MED ORDER — TECHNETIUM TC 99M SESTAMIBI GENERIC - CARDIOLITE
10.0000 | Freq: Once | INTRAVENOUS | Status: AC | PRN
  Administered 2013-10-02: 10 via INTRAVENOUS

## 2013-10-02 NOTE — Procedures (Addendum)
[facility] [address] [place] [address] [phone]  Cardiology Nuclear Med Study  Anna Gamble is a 78 y.o. female     MRN : [id number]     DOB: 06-Sep-1935  Procedure Date: 10/02/2013  Nuclear Med Background Indication for Stress Test:  Evaluation for Ischemia History:  Mild aortic valve stenosis;No prior respiratory history reported;Last NUC MPI in 06/2011-nonischemic;EF=72% Cardiac Risk Factors: Family History - CAD, Hypertension, Lipids and Obesity  Symptoms:  Chest Pain, DOE, Fatigue and Light-Headedness   Nuclear Pre-Procedure Caffeine/Decaff Intake:  9:00pm NPO After: 7:00am   IV Site: R Forearm  IV 0.9% NS with Angio Cath:  22g  Chest Size (in):  n/a IV Started by: Rolene Course, RN  Height: 5\' 2"  (1.575 m)  Cup Size: C  BMI:  Body mass index is 44.07 kg/(m^2). Weight:  241 lb (109.317 kg)   Tech Comments:  n/a    Nuclear Med Study 1 or 2 day study: 1 day  Stress Test Type:  [place] Provider:  Kirk Ruths, MD   Resting Radionuclide: Technetium 13m Sestamibi  Resting Radionuclide Dose: 10.1 mCi   Stress Radionuclide:  Technetium 20m Sestamibi  Stress Radionuclide Dose: 30.2 mCi           Stress Protocol Rest HR: 61 Stress HR: 81  Rest BP: 132/90 Stress BP: 136/78  Exercise Time (min): n/a METS: n/a   Predicted Max HR: 144 bpm % Max HR: 58.33 bpm Rate Pressure Product: 11424  Dose of Adenosine (mg):  n/a Dose of Lexiscan: 0.4 mg  Dose of Atropine (mg): n/a Dose of Dobutamine: n/a mcg/kg/min (at max HR)  Stress Test Technologist: Leane Para, CCT Nuclear Technologist: Otho Perl, CNMT   Rest Procedure:  Myocardial perfusion imaging was performed at rest 45 minutes following the intravenous administration of Technetium 80m Sestamibi. Stress Procedure:  The patient received IV Lexiscan 0.4 mg over 15-seconds.  Technetium 21m Sestamibi injected IV at 30-seconds.   Patient experienced SOB, Chest Tightness and Headache and 75 mg Aminophylline IV was administered. There were no significant changes with Lexiscan.  Quantitative spect images were obtained after a 45 minute delay.  Transient Ischemic Dilatation (Normal <1.22):  1.15 QGS EDV:  84 ml QGS ESV:  28 ml LV Ejection Fraction: 67%  Rest ECG: NSR - Normal EKG  Stress ECG: No significant change from baseline ECG  QPS Raw Data Images:  Normal; no motion artifact; normal heart/lung ratio. Stress Images:  There is decreased uptake in the anterior wall. Rest Images:  Normal homogeneous uptake in all areas of the myocardium. Subtraction (SDS):  These findings are consistent with ischemia.  Impression Exercise Capacity:  Lexiscan with no exercise. BP Response:  Hypotensive blood pressure response. Clinical Symptoms:  Mild chest pain/dyspnea. ECG Impression:  No significant ECG changes with Lexiscan. Comparison with Prior Nuclear Study: Prior study was non-ischemic  Overall Impression:  Intermediate risk stress nuclear study with small (5%) revesrible (SDS 5) anterior perfusion defect, which likely represents ischemia. Shifting breast artifact cannot be excluded, but is unlikely.  LV Wall Motion:  NL LV Function; NL Wall Motion; EF 67%  Pixie Casino, MD, [facility] Board Certified in Nuclear Cardiology Attending Cardiologist [place], MD  10/02/2013 12:10 PM

## 2013-10-02 NOTE — Progress Notes (Signed)
2D Echo Performed 10/02/2013    Marygrace Drought, RCS

## 2013-10-04 DIAGNOSIS — L570 Actinic keratosis: Secondary | ICD-10-CM | POA: Diagnosis not present

## 2013-10-04 DIAGNOSIS — Z85828 Personal history of other malignant neoplasm of skin: Secondary | ICD-10-CM | POA: Diagnosis not present

## 2013-10-08 ENCOUNTER — Ambulatory Visit (INDEPENDENT_AMBULATORY_CARE_PROVIDER_SITE_OTHER): Payer: Medicare Other | Admitting: Cardiology

## 2013-10-08 ENCOUNTER — Other Ambulatory Visit: Payer: Self-pay | Admitting: Cardiology

## 2013-10-08 ENCOUNTER — Encounter: Payer: Self-pay | Admitting: Cardiology

## 2013-10-08 ENCOUNTER — Encounter: Payer: Self-pay | Admitting: *Deleted

## 2013-10-08 VITALS — BP 126/80 | HR 66 | Ht 62.0 in | Wt 241.0 lb

## 2013-10-08 DIAGNOSIS — R072 Precordial pain: Secondary | ICD-10-CM

## 2013-10-08 DIAGNOSIS — I359 Nonrheumatic aortic valve disorder, unspecified: Secondary | ICD-10-CM

## 2013-10-08 DIAGNOSIS — E785 Hyperlipidemia, unspecified: Secondary | ICD-10-CM | POA: Diagnosis not present

## 2013-10-08 NOTE — Assessment & Plan Note (Signed)
Continue statin. 

## 2013-10-08 NOTE — Assessment & Plan Note (Signed)
Blood pressure controlled. Continue present medications. 

## 2013-10-08 NOTE — Assessment & Plan Note (Signed)
Nuclear study with possible anterior ischemia. Plan catheterization as outlined under aortic stenosis.

## 2013-10-08 NOTE — Patient Instructions (Signed)
Your physician has requested that you have a cardiac catheterization. Cardiac catheterization is used to diagnose and/or treat various heart conditions. Doctors may recommend this procedure for a number of different reasons. The most common reason is to evaluate chest pain. Chest pain can be a symptom of coronary artery disease (CAD), and cardiac catheterization can show whether plaque is narrowing or blocking your heart's arteries. This procedure is also used to evaluate the valves, as well as measure the blood flow and oxygen levels in different parts of your heart. For further information please visit [email or web address]. Please follow instruction sheet, as given.   Your physician recommends that you HAVE LAB WORK TODAY

## 2013-10-08 NOTE — Progress Notes (Signed)
HPI: FU aortic stenosis. Placed on beta blocker previously secondary to PVCs. Nuclear study 9/15 showed anterior ischemia, shifting breast attenuation not excluded; EF 67. Echo 9/15 showed normal LV function, mild AS with mean gradient of 15 mmHg, mild LAE and mild MR. Since last seen, She continues to have dyspnea on exertion. This is chronic. No orthopnea, PND or pedal edema. Occasional chest pain both at rest and with exertion.   Current Outpatient Prescriptions  Medication Sig Dispense Refill  . aspirin 81 MG tablet Take 81 mg by mouth daily.      Marland Kitchen CALCIUM PO Take 1 tablet by mouth daily.        . cholecalciferol (VITAMIN D) 1000 UNITS tablet Take 1,000 Units by mouth daily.        . fluorouracil (EFUDEX) 5 % cream       . losartan-hydrochlorothiazide (HYZAAR) 50-12.5 MG per tablet Take 1 tablet by mouth   daily  90 tablet  1  . metoprolol succinate (TOPROL-XL) 50 MG 24 hr tablet Take 1 tablet by mouth  daily  90 tablet  0  . multivitamin (THERAGRAN) per tablet Take 1 tablet by mouth daily.       Marland Kitchen omeprazole-sodium bicarbonate (ZEGERID) 40-1100 MG per capsule Take 1 capsule by mouth daily. 1 capsule daily      . rosuvastatin (CRESTOR) 10 MG tablet Take 1 tablet (10 mg total) by mouth daily.  90 tablet  3  . VITAMIN E PO Take 1 tablet by mouth daily.         No current facility-administered medications for this visit.     Past Medical History  Diagnosis Date  . Hypertension   . Hyperlipidemia   . Generalized osteoarthrosis, involving multiple sites   . Impaired fasting glucose   . Obesity   . GERD (gastroesophageal reflux disease)     dilitation  . Personal history of other diseases of digestive system   . Colon polyp   . Dyspnea   . PONV (postoperative nausea and vomiting)   . Coronary artery disease     aortic stenosis/mild per ECHO  . Aortic stenosis     Past Surgical History  Procedure Laterality Date  . Appendectomy    . Cholecystectomy    . Knee surgery       rt  . Foot surgery      rt  . Tonsillectomy    . Total knee arthroplasty  02/01/2011    Procedure: TOTAL KNEE ARTHROPLASTY;  Surgeon: Kerin Salen;  Location: [facility];  Service: Orthopedics;  Laterality: Right;  DEPUY/SIGMA    History   Social History  . Marital Status: Married    Spouse Name: N/A    Number of Children: N/A  . Years of Education: N/A   Occupational History  . Retired     [employer] of [employer]   Social History Main Topics  . Smoking status: Never Smoker   . Smokeless tobacco: Never Used     Comment: as a teenager  . Alcohol Use: No  . Drug Use: No  . Sexual Activity: Not on file     Comment: quit 40 yrs ago   Other Topics Concern  . Not on file   Social History Narrative   hh of 3 -5  Married    Invalid son paraplegia and husband    And grandson recently     No pets   Fluctuating hh membors she cooks  and prepares for them              ROS: Arthralgias but no fevers or chills, productive cough, hemoptysis, dysphasia, odynophagia, melena, hematochezia, dysuria, hematuria, rash, seizure activity, orthopnea, PND, pedal edema, claudication. Remaining systems are negative.  Physical Exam: Well-developed obese in no acute distress.  Skin is warm and dry.  HEENT is normal.  Neck is supple.  Chest is clear to auscultation with normal expansion.  Cardiovascular exam is regular rate and rhythm. 2/6 systolic murmur left sternal border. S2 diminished. Abdominal exam nontender or distended. No masses palpated. Extremities show no edema. neuro grossly intact

## 2013-10-08 NOTE — Assessment & Plan Note (Signed)
Echocardiogram shows mild to moderate aortic stenosis. Her murmur sounds worse. Her nuclear study showed possible anterior ischemia. We discussed options today including medical therapy versus cardiac catheterization. She would prefer definitive evaluation and I think this is appropriate. Plan right and left cardiac catheterization. Continue aspirin. The risks and benefits were discussed and she agrees to proceed.

## 2013-10-08 NOTE — Assessment & Plan Note (Signed)
Most likely multifactorial including obesity hypoventilation syndrome and obstructive sleep apnea. Question contribution from aortic stenosis and coronary disease. Proceed with right and left catheterization is outlined under aortic stenosis.

## 2013-10-09 ENCOUNTER — Encounter (HOSPITAL_COMMUNITY): Payer: Self-pay | Admitting: Pharmacy Technician

## 2013-10-09 LAB — BASIC METABOLIC PANEL WITH GFR
BUN: 14 mg/dL (ref 6–23)
CALCIUM: 9.3 mg/dL (ref 8.4–10.5)
CO2: 29 mEq/L (ref 19–32)
Chloride: 101 mEq/L (ref 96–112)
Creat: 0.86 mg/dL (ref 0.50–1.10)
GFR, Est African American: 76 mL/min
GFR, Est Non African American: 66 mL/min
GLUCOSE: 108 mg/dL — AB (ref 70–99)
Potassium: 4.1 mEq/L (ref 3.5–5.3)
Sodium: 140 mEq/L (ref 135–145)

## 2013-10-09 LAB — PROTIME-INR
INR: 0.93 (ref <1.50)
Prothrombin Time: 12.5 seconds (ref 11.6–15.2)

## 2013-10-09 LAB — CBC
HCT: 38.6 % (ref 36.0–46.0)
Hemoglobin: 12.9 g/dL (ref 12.0–15.0)
MCH: 31.5 pg (ref 26.0–34.0)
MCHC: 33.4 g/dL (ref 30.0–36.0)
MCV: 94.4 fL (ref 78.0–100.0)
PLATELETS: 257 10*3/uL (ref 150–400)
RBC: 4.09 MIL/uL (ref 3.87–5.11)
RDW: 13.7 % (ref 11.5–15.5)
WBC: 6.2 10*3/uL (ref 4.0–10.5)

## 2013-10-10 ENCOUNTER — Ambulatory Visit (HOSPITAL_COMMUNITY)
Admission: RE | Admit: 2013-10-10 | Discharge: 2013-10-10 | Disposition: A | Discharge status: Home / Self Care | Payer: Medicare Other | Source: Ambulatory Visit | Attending: Cardiovascular Disease | Admitting: Cardiovascular Disease

## 2013-10-10 ENCOUNTER — Encounter (HOSPITAL_COMMUNITY)
Admission: RE | Disposition: A | Discharge status: Home / Self Care | Payer: Self-pay | Source: Ambulatory Visit | Attending: Cardiovascular Disease

## 2013-10-10 DIAGNOSIS — I10 Essential (primary) hypertension: Secondary | ICD-10-CM | POA: Diagnosis not present

## 2013-10-10 DIAGNOSIS — M159 Polyosteoarthritis, unspecified: Secondary | ICD-10-CM | POA: Insufficient documentation

## 2013-10-10 DIAGNOSIS — E669 Obesity, unspecified: Secondary | ICD-10-CM | POA: Diagnosis not present

## 2013-10-10 DIAGNOSIS — E785 Hyperlipidemia, unspecified: Secondary | ICD-10-CM | POA: Diagnosis not present

## 2013-10-10 DIAGNOSIS — R079 Chest pain, unspecified: Secondary | ICD-10-CM | POA: Diagnosis not present

## 2013-10-10 DIAGNOSIS — I2510 Atherosclerotic heart disease of native coronary artery without angina pectoris: Secondary | ICD-10-CM | POA: Insufficient documentation

## 2013-10-10 DIAGNOSIS — I359 Nonrheumatic aortic valve disorder, unspecified: Secondary | ICD-10-CM

## 2013-10-10 DIAGNOSIS — Z6841 Body Mass Index (BMI) 40.0 and over, adult: Secondary | ICD-10-CM | POA: Diagnosis not present

## 2013-10-10 DIAGNOSIS — Z7982 Long term (current) use of aspirin: Secondary | ICD-10-CM | POA: Diagnosis not present

## 2013-10-10 HISTORY — PX: LEFT AND RIGHT HEART CATHETERIZATION WITH CORONARY ANGIOGRAM: SHX5449

## 2013-10-10 LAB — POCT I-STAT 3, ART BLOOD GAS (G3+)
Acid-Base Excess: 2 mmol/L (ref 0.0–2.0)
Acid-Base Excess: 4 mmol/L — ABNORMAL HIGH (ref 0.0–2.0)
Bicarbonate: 27.1 mEq/L — ABNORMAL HIGH (ref 20.0–24.0)
Bicarbonate: 30.2 mEq/L — ABNORMAL HIGH (ref 20.0–24.0)
O2 SAT: 99 %
O2 Saturation: 92 %
PCO2 ART: 44.5 mmHg (ref 35.0–45.0)
PH ART: 7.379 (ref 7.350–7.450)
TCO2: 28 mmol/L (ref 0–100)
TCO2: 32 mmol/L (ref 0–100)
pCO2 arterial: 51.1 mmHg — ABNORMAL HIGH (ref 35.0–45.0)
pH, Arterial: 7.393 (ref 7.350–7.450)
pO2, Arterial: 143 mmHg — ABNORMAL HIGH (ref 80.0–100.0)
pO2, Arterial: 65 mmHg — ABNORMAL LOW (ref 80.0–100.0)

## 2013-10-10 LAB — POCT I-STAT 3, VENOUS BLOOD GAS (G3P V)
ACID-BASE EXCESS: 3 mmol/L — AB (ref 0.0–2.0)
ACID-BASE EXCESS: 4 mmol/L — AB (ref 0.0–2.0)
Acid-Base Excess: 1 mmol/L (ref 0.0–2.0)
Acid-Base Excess: 3 mmol/L — ABNORMAL HIGH (ref 0.0–2.0)
Bicarbonate: 26.8 mEq/L — ABNORMAL HIGH (ref 20.0–24.0)
Bicarbonate: 28.3 mEq/L — ABNORMAL HIGH (ref 20.0–24.0)
Bicarbonate: 29.3 mEq/L — ABNORMAL HIGH (ref 20.0–24.0)
Bicarbonate: 29.8 mEq/L — ABNORMAL HIGH (ref 20.0–24.0)
O2 SAT: 88 %
O2 Saturation: 93 %
O2 Saturation: 93 %
O2 Saturation: 77 %
PCO2 VEN: 45.2 mmHg (ref 45.0–50.0)
PH VEN: 7.377 — AB (ref 7.250–7.300)
PO2 VEN: 56 mmHg — AB (ref 30.0–45.0)
PO2 VEN: 70 mmHg — AB (ref 30.0–45.0)
PO2 VEN: 43 mmHg (ref 30.0–45.0)
TCO2: 28 mmol/L (ref 0–100)
TCO2: 30 mmol/L (ref 0–100)
TCO2: 31 mmol/L (ref 0–100)
TCO2: 31 mmol/L (ref 0–100)
pCO2, Ven: 48.1 mmHg (ref 45.0–50.0)
pCO2, Ven: 48.6 mmHg (ref 45.0–50.0)
pCO2, Ven: 50.9 mmHg — ABNORMAL HIGH (ref 45.0–50.0)
pH, Ven: 7.382 — ABNORMAL HIGH (ref 7.250–7.300)
pH, Ven: 7.388 — ABNORMAL HIGH (ref 7.250–7.300)
pH, Ven: 7.376 — ABNORMAL HIGH (ref 7.250–7.300)
pO2, Ven: 70 mmHg — ABNORMAL HIGH (ref 30.0–45.0)

## 2013-10-10 SURGERY — LEFT AND RIGHT HEART CATHETERIZATION WITH CORONARY ANGIOGRAM
Anesthesia: LOCAL

## 2013-10-10 MED ORDER — SODIUM CHLORIDE 0.9 % IJ SOLN: 3.0000 mL | INTRAMUSCULAR | Status: DC | PRN

## 2013-10-10 MED ORDER — SODIUM CHLORIDE 0.9 % IV SOLN
1.0000 mL/kg/h | INTRAVENOUS | Status: DC
  Administered 2013-10-10: 1 mL/kg/h via INTRAVENOUS

## 2013-10-10 MED ORDER — SODIUM CHLORIDE 0.9 % IJ SOLN: 3.0000 mL | Freq: Two times a day (BID) | INTRAMUSCULAR | Status: DC

## 2013-10-10 MED ORDER — FENTANYL CITRATE 0.05 MG/ML IJ SOLN
INTRAMUSCULAR | Status: AC
  Filled 2013-10-10: qty 2

## 2013-10-10 MED ORDER — MIDAZOLAM HCL 2 MG/2ML IJ SOLN
INTRAMUSCULAR | Status: AC
  Filled 2013-10-10: qty 2

## 2013-10-10 MED ORDER — ASPIRIN 81 MG PO CHEW
CHEWABLE_TABLET | ORAL | Status: AC
  Filled 2013-10-10: qty 1

## 2013-10-10 MED ORDER — VERAPAMIL HCL 2.5 MG/ML IV SOLN
INTRAVENOUS | Status: AC
  Filled 2013-10-10: qty 2

## 2013-10-10 MED ORDER — SODIUM CHLORIDE 0.9 % IV SOLN
INTRAVENOUS | Status: AC
Start: 2013-10-10 — End: 2013-10-10

## 2013-10-10 MED ORDER — ASPIRIN 81 MG PO CHEW
81.0000 mg | CHEWABLE_TABLET | ORAL | Status: AC
  Administered 2013-10-10: 81 mg via ORAL

## 2013-10-10 MED ORDER — HEPARIN SODIUM (PORCINE) 1000 UNIT/ML IJ SOLN
INTRAMUSCULAR | Status: AC
  Filled 2013-10-10: qty 1

## 2013-10-10 MED ORDER — SODIUM CHLORIDE 0.9 % IV SOLN: 250.0000 mL | INTRAVENOUS | Status: DC | PRN

## 2013-10-10 MED ORDER — LIDOCAINE HCL (PF) 1 % IJ SOLN
INTRAMUSCULAR | Status: AC
  Filled 2013-10-10: qty 30

## 2013-10-10 MED ORDER — HEPARIN (PORCINE) IN NACL 2-0.9 UNIT/ML-% IJ SOLN
INTRAMUSCULAR | Status: AC
  Filled 2013-10-10: qty 1500

## 2013-10-10 NOTE — CV Procedure (Signed)
      Cardiac Catheterization Operative Report  CHRISTYNE MCCAIN [id number] 9/23/20159:40 AM Lottie Dawson, MD  Procedure Performed:  1. Left Heart Catheterization 2. Selective Coronary Angiography 3. Right Heart Catheterization 4. Left ventricular angiogram  Operator: Lauree Chandler, MD  Indication:  78 yo female with history of HTN, HLD, AS with recent chest pain. Stress test is abnormal. Cardiac cath to exclude CAD, assess pressures,assess AS.                                 Procedure Details: The risks, benefits, complications, treatment options, and expected outcomes were discussed with the patient. The patient and/or family concurred with the proposed plan, giving informed consent. The patient was brought to the cath lab after IV hydration was begun and oral premedication was given. The patient was further sedated with Versed and Fentanyl. There was a antecubital IV catheter present. This area was prepped and draped. I then changed this for a 5 [place] sheath. Right heart cath performed with a balloon tipped catheter. I then prepped the right wrist. 1% lidocaine used for local anesthesia. A 5 French sheath was placed in the right radial artery. 3 mg Verapamil given through the sheath. 5000 Units IV heparin was given. Standard diagnostic catheters were used to perform selective coronary angiography. A pigtail catheter was used to perform a left ventricular angiogram. There were no immediate complications. The patient was taken to the recovery area in stable condition.   Hemodynamic Findings: Ao: 119/54               LV: 124/9/18 RA: 10                RV: 33/11/12 PA: 36/7 (mean 24)       PCWP: 14 Fick Cardiac Output: 7.2 L/min Fick Cardiac Index: 3.4 L/min/m2 Central Aortic Saturation: 99% Pulmonary Artery Saturation: 77%  Angiographic Findings:  Left main: 20% diffuse stenosis  Left Anterior Descending Artery:  Moderate caliber vessel that does not reach the  apex. Small caliber diagonal branch. No obstructive disease.   Circumflex Artery: Large caliber vessel with two moderate caliber obtuse marginal branches. No obstructive disease.   Right Coronary Artery: Large caliber dominant vessel with no obstructive disease.   Left Ventricular Angiogram: LVEF=60-65%.   Impression: 1. Mild non-obstructive CAD.  2. Normal LV function 3. Aortic stenosis, mild to moderate with small gradient and valve very easy to cross.   Recommendations: Medical management of mild CAD.        Complications:  None; patient tolerated the procedure well.

## 2013-10-10 NOTE — Discharge Instructions (Signed)
Radial Site Care Refer to this sheet in the next few weeks. These instructions provide you with information on caring for yourself after your procedure. Your caregiver may also give you more specific instructions. Your treatment has been planned according to current medical practices, but problems sometimes occur. Call your caregiver if you have any problems or questions after your procedure. HOME CARE INSTRUCTIONS  You may shower the day after the procedure.Remove the bandage (dressing) and gently wash the site with plain soap and water.Gently pat the site dry.  Do not apply powder or lotion to the site.  Do not submerge the affected site in water for 3 to 5 days.  Inspect the site at least twice daily.  Do not flex or bend the affected arm for 24 hours.  No lifting over 5 pounds (2.3 kg) for 5 days after your procedure.  Do not drive home if you are discharged the same day of the procedure. Have someone else drive you.  You may drive 24 hours after the procedure unless otherwise instructed by your caregiver.  Do not operate machinery or power tools for 24 hours.  A responsible adult should be with you for the first 24 hours after you arrive home. What to expect:  Any bruising will usually fade within 1 to 2 weeks.  Blood that collects in the tissue (hematoma) may be painful to the touch. It should usually decrease in size and tenderness within 1 to 2 weeks. SEEK IMMEDIATE MEDICAL CARE IF:  You have unusual pain at the radial site.  You have redness, warmth, swelling, or pain at the radial site.  You have drainage (other than a small amount of blood on the dressing).  You have chills.  You have a fever or persistent symptoms for more than 72 hours.  You have a fever and your symptoms suddenly get worse.  Your arm becomes pale, cool, tingly, or numb.  You have heavy bleeding from the site. Hold pressure on the site. Call 911 Document Released: 02/06/2010 Document  Revised: 03/29/2011 Document Reviewed: 02/06/2010 [facility] Patient Information 2015 [place], [place]. This information is not intended to replace advice given to you by your health care provider. Make sure you discuss any questions you have with your health care provider.

## 2013-10-10 NOTE — Interval H&P Note (Signed)
History and Physical Interval Note:  10/10/2013 8:25 AM  Anna Gamble  has presented today for cardiac cath with the diagnosis of chest pain, abnormal stress test, AS.   The various methods of treatment have been discussed with the patient and family. After consideration of risks, benefits and other options for treatment, the patient has consented to  Procedure(s): LEFT AND RIGHT HEART CATHETERIZATION WITH CORONARY ANGIOGRAM (N/A) as a surgical intervention .  The patient's history has been reviewed, patient examined, no change in status, stable for surgery.  I have reviewed the patient's chart and labs.  Questions were answered to the patient's satisfaction.    Cath Lab Visit (complete for each Cath Lab visit)  Clinical Evaluation Leading to the Procedure:   ACS: No.  Non-ACS:    Anginal Classification: CCS II  Anti-ischemic medical therapy: Minimal Therapy (1 class of medications)  Non-Invasive Test Results: Intermediate-risk stress test findings: cardiac mortality 1-3%/year  Prior CABG: No previous CABG        MCALHANY,CHRISTOPHER

## 2013-10-10 NOTE — H&P (View-Only) (Signed)
HPI: FU aortic stenosis. Placed on beta blocker previously secondary to PVCs. Nuclear study 9/15 showed anterior ischemia, shifting breast attenuation not excluded; EF 67. Echo 9/15 showed normal LV function, mild AS with mean gradient of 15 mmHg, mild LAE and mild MR. Since last seen, She continues to have dyspnea on exertion. This is chronic. No orthopnea, PND or pedal edema. Occasional chest pain both at rest and with exertion.   Current Outpatient Prescriptions  Medication Sig Dispense Refill  . aspirin 81 MG tablet Take 81 mg by mouth daily.      Marland Kitchen CALCIUM PO Take 1 tablet by mouth daily.        . cholecalciferol (VITAMIN D) 1000 UNITS tablet Take 1,000 Units by mouth daily.        . fluorouracil (EFUDEX) 5 % cream       . losartan-hydrochlorothiazide (HYZAAR) 50-12.5 MG per tablet Take 1 tablet by mouth   daily  90 tablet  1  . metoprolol succinate (TOPROL-XL) 50 MG 24 hr tablet Take 1 tablet by mouth  daily  90 tablet  0  . multivitamin (THERAGRAN) per tablet Take 1 tablet by mouth daily.       Marland Kitchen omeprazole-sodium bicarbonate (ZEGERID) 40-1100 MG per capsule Take 1 capsule by mouth daily. 1 capsule daily      . rosuvastatin (CRESTOR) 10 MG tablet Take 1 tablet (10 mg total) by mouth daily.  90 tablet  3  . VITAMIN E PO Take 1 tablet by mouth daily.         No current facility-administered medications for this visit.     Past Medical History  Diagnosis Date  . Hypertension   . Hyperlipidemia   . Generalized osteoarthrosis, involving multiple sites   . Impaired fasting glucose   . Obesity   . GERD (gastroesophageal reflux disease)     dilitation  . Personal history of other diseases of digestive system   . Colon polyp   . Dyspnea   . PONV (postoperative nausea and vomiting)   . Coronary artery disease     aortic stenosis/mild per ECHO  . Aortic stenosis     Past Surgical History  Procedure Laterality Date  . Appendectomy    . Cholecystectomy    . Knee surgery       rt  . Foot surgery      rt  . Tonsillectomy    . Total knee arthroplasty  02/01/2011    Procedure: TOTAL KNEE ARTHROPLASTY;  Surgeon: Kerin Salen;  Location: [facility];  Service: Orthopedics;  Laterality: Right;  DEPUY/SIGMA    History   Social History  . Marital Status: Married    Spouse Name: N/A    Number of Children: N/A  . Years of Education: N/A   Occupational History  . Retired     [employer] of [employer]   Social History Main Topics  . Smoking status: Never Smoker   . Smokeless tobacco: Never Used     Comment: as a teenager  . Alcohol Use: No  . Drug Use: No  . Sexual Activity: Not on file     Comment: quit 40 yrs ago   Other Topics Concern  . Not on file   Social History Narrative   hh of 3 -5  Married    Invalid son paraplegia and husband    And grandson recently     No pets   Fluctuating hh membors she cooks  and prepares for them              ROS: Arthralgias but no fevers or chills, productive cough, hemoptysis, dysphasia, odynophagia, melena, hematochezia, dysuria, hematuria, rash, seizure activity, orthopnea, PND, pedal edema, claudication. Remaining systems are negative.  Physical Exam: Well-developed obese in no acute distress.  Skin is warm and dry.  HEENT is normal.  Neck is supple.  Chest is clear to auscultation with normal expansion.  Cardiovascular exam is regular rate and rhythm. 2/6 systolic murmur left sternal border. S2 diminished. Abdominal exam nontender or distended. No masses palpated. Extremities show no edema. neuro grossly intact

## 2013-10-10 NOTE — Progress Notes (Signed)
Denies dizziness, pt just ambulated to bathroom and has just been seated, continue to monitor.

## 2013-10-19 ENCOUNTER — Other Ambulatory Visit: Payer: Self-pay | Admitting: Cardiology

## 2013-10-19 DIAGNOSIS — H2511 Age-related nuclear cataract, right eye: Secondary | ICD-10-CM | POA: Diagnosis not present

## 2013-10-19 DIAGNOSIS — I10 Essential (primary) hypertension: Secondary | ICD-10-CM | POA: Diagnosis not present

## 2013-10-19 DIAGNOSIS — H18411 Arcus senilis, right eye: Secondary | ICD-10-CM | POA: Diagnosis not present

## 2013-10-19 DIAGNOSIS — H02839 Dermatochalasis of unspecified eye, unspecified eyelid: Secondary | ICD-10-CM | POA: Diagnosis not present

## 2013-10-24 DIAGNOSIS — Z23 Encounter for immunization: Secondary | ICD-10-CM | POA: Diagnosis not present

## 2013-10-25 ENCOUNTER — Ambulatory Visit (INDEPENDENT_AMBULATORY_CARE_PROVIDER_SITE_OTHER): Payer: Medicare Other | Admitting: Physician Assistant

## 2013-10-25 ENCOUNTER — Encounter: Payer: Self-pay | Admitting: Physician Assistant

## 2013-10-25 VITALS — BP 132/86 | HR 72 | Ht 63.0 in | Wt 242.1 lb

## 2013-10-25 DIAGNOSIS — R0789 Other chest pain: Secondary | ICD-10-CM

## 2013-10-25 DIAGNOSIS — I359 Nonrheumatic aortic valve disorder, unspecified: Secondary | ICD-10-CM | POA: Diagnosis not present

## 2013-10-25 DIAGNOSIS — R0602 Shortness of breath: Secondary | ICD-10-CM

## 2013-10-25 DIAGNOSIS — I10 Essential (primary) hypertension: Secondary | ICD-10-CM

## 2013-10-25 DIAGNOSIS — E669 Obesity, unspecified: Secondary | ICD-10-CM

## 2013-10-25 NOTE — Assessment & Plan Note (Signed)
Right heart cath pressures: Ao: 119/54  LV: 124/9/18  RA: 10  RV: 33/11/12  PA: 36/7 (mean 24)  PCWP: 14  Fick Cardiac Output: 7.2 L/min  Fick Cardiac Index: 3.4 L/min/m2  Central Aortic Saturation: 99%  Pulmonary Artery Saturation: 77%  Hopefully when she starts working with a Physiological scientist it starts to weight her dyspnea will start to improve.

## 2013-10-25 NOTE — Assessment & Plan Note (Signed)
Status post left heart catheterization revealing 1. Mild non-obstructive CAD.  2. Normal LV function  3. Aortic stenosis, mild to moderate with small gradient and valve very easy to cross.

## 2013-10-25 NOTE — Progress Notes (Signed)
Date:  10/25/2013   ID:  Anna Gamble, DOB 1935/12/11, MRN [id number]  PCP:  Lottie Dawson, MD  Primary Cardiologist:  [name]    History of Present Illness: Anna Gamble is a 78 y.o. female with mild aortic stenosis, hypertension, hyperlipidemia arthritis, GERD, dyspnea. The patient underwent right and left heart catheterizations recently.  This revealed mild nonobstructive coronary disease, normal LV function, and essentially normal right heart pressures.  She presents today for post cath Follow up.  Her right wrist feels good and otherwise has no complaints. She reports she can start with a personal trainer next week. She is also decreased it back in the pool swimming again.  She reports a history of several torn ligaments in her left ankle which has hindered her ability to exercise.  The patient currently denies nausea, vomiting, fever, chest pain, shortness of breath beyond her baseline, orthopnea, dizziness, PND, cough, congestion, abdominal pain, hematochezia, melena, lower extremity edema.   Right and left heart catheterization Hemodynamic Findings:  Ao: 119/54  LV: 124/9/18  RA: 10  RV: 33/11/12  PA: 36/7 (mean 24)  PCWP: 14  Fick Cardiac Output: 7.2 L/min  Fick Cardiac Index: 3.4 L/min/m2  Central Aortic Saturation: 99%  Pulmonary Artery Saturation: 77%  Angiographic Findings:  Left main: 20% diffuse stenosis  Left Anterior Descending Artery: Moderate caliber vessel that does not reach the apex. Small caliber diagonal branch. No obstructive disease.  Circumflex Artery: Large caliber vessel with two moderate caliber obtuse marginal branches. No obstructive disease.  Right Coronary Artery: Large caliber dominant vessel with no obstructive disease.  Left Ventricular Angiogram: LVEF=60-65%.  Impression:  1. Mild non-obstructive CAD.  2. Normal LV function  3. Aortic stenosis, mild to moderate with small gradient and valve very easy to cross.  Recommendations:  Medical management of mild CAD.  Complications: None; patient tolerated the procedure well.      Wt Readings from Last 3 Encounters:  10/25/13 242 lb 1.6 oz (109.816 kg)  10/10/13 241 lb (109.317 kg)  10/10/13 241 lb (109.317 kg)     Past Medical History  Diagnosis Date  . Hypertension   . Hyperlipidemia   . Generalized osteoarthrosis, involving multiple sites   . Impaired fasting glucose   . Obesity   . GERD (gastroesophageal reflux disease)     dilitation  . Personal history of other diseases of digestive system   . Colon polyp   . Dyspnea   . PONV (postoperative nausea and vomiting)   . Coronary artery disease     aortic stenosis/mild per ECHO  . Aortic stenosis     Current Outpatient Prescriptions  Medication Sig Dispense Refill  . aspirin 81 MG tablet Take 81 mg by mouth daily.      Marland Kitchen CALCIUM PO Take 1 tablet by mouth daily.        . cholecalciferol (VITAMIN D) 1000 UNITS tablet Take 1,000 Units by mouth daily.        . fluorouracil (EFUDEX) 5 % cream Apply 1 application topically 2 (two) times daily.       Marland Kitchen losartan-hydrochlorothiazide (HYZAAR) 50-12.5 MG per tablet Take 1 tablet by mouth   daily  90 tablet  1  . metoprolol succinate (TOPROL-XL) 50 MG 24 hr tablet Take 1 tablet by mouth  daily  30 tablet  6  . multivitamin (THERAGRAN) per tablet Take 1 tablet by mouth daily.       Marland Kitchen omeprazole-sodium bicarbonate (ZEGERID) 40-1100 MG per capsule  Take 1 capsule by mouth daily. 1 capsule daily      . rosuvastatin (CRESTOR) 10 MG tablet Take 1 tablet (10 mg total) by mouth daily.  90 tablet  3  . vitamin E 400 UNIT capsule Take 400 Units by mouth daily.      Marland Kitchen BESIVANCE 0.6 % SUSP       . DUREZOL 0.05 % EMUL       . ILEVRO 0.3 % SUSP        No current facility-administered medications for this visit.    Allergies:    Allergies  Allergen Reactions  . Lisinopril     REACTION: Cough  . Statins     REACTION: muscle aches with some statins    Social History:   The patient  reports that she has never smoked. She has never used smokeless tobacco. She reports that she does not drink alcohol or use illicit drugs.   Family history:   Family History  Problem Relation Age of Onset  . Coronary artery disease      female- 1st degree relative  . Coronary artery disease      female- 1st degree relative  . Hypertension    . Hyperlipidemia    . Heart attack Mother     age 53's  . Heart attack Father     age 1's  . Stroke Brother     died   . Other Son     paraplegia 68  . Sleep apnea Son     ROS:  Please see the history of present illness.  All other systems reviewed and negative.   PHYSICAL EXAM: VS:  BP 132/86  Pulse 72  Ht 5\' 3"  (1.6 m)  Wt 242 lb 1.6 oz (109.816 kg)  BMI 42.90 kg/m2 Morbidly obese, well developed, in no acute distress HEENT: Pupils are equal round react to light accommodation extraocular movements are intact.  Cardiac: Regular rate and rhythm with 6 systolic murmur Lungs:  clear to auscultation bilaterally, no wheezing, rhonchi or rales Ext: no lower extremity edema.  2+ radial and PT pulses. Skin: warm and dry Neuro:  Grossly normal    ASSESSMENT AND PLAN:  Problem List Items Addressed This Visit   Aortic valve disorder     Mild aortic stenosis by recent 2-D echocardiogram. Mean and peak gradients of 1527 mm of mercury respectively.  Normal LV function.     Chest pain     Status post left heart catheterization revealing 1. Mild non-obstructive CAD.  2. Normal LV function  3. Aortic stenosis, mild to moderate with small gradient and valve very easy to cross.      Essential hypertension - Primary     Mildly elevated. No change in therapy.    Obesity     We discussed referral to a registered dietitian. Patient declined stating she knows what she needs to eat. She reports she can start out with a personal trainer next week and is hoping to get back into the pool.    SOB     Right heart cath pressures: Ao:  119/54  LV: 124/9/18  RA: 10  RV: 33/11/12  PA: 36/7 (mean 24)  PCWP: 14  Fick Cardiac Output: 7.2 L/min  Fick Cardiac Index: 3.4 L/min/m2  Central Aortic Saturation: 99%  Pulmonary Artery Saturation: 77%  Hopefully when she starts working with a Physiological scientist it starts to weight her dyspnea will start to improve.

## 2013-10-25 NOTE — Assessment & Plan Note (Signed)
Mildly elevated. No change in therapy.

## 2013-10-25 NOTE — Assessment & Plan Note (Signed)
We discussed referral to a registered dietitian. Patient declined stating she knows what she needs to eat. She reports she can start out with a personal trainer next week and is hoping to get back into the pool.

## 2013-10-25 NOTE — Patient Instructions (Signed)
1.  Follow up with [name] in 6 months

## 2013-10-25 NOTE — Assessment & Plan Note (Signed)
Mild aortic stenosis by recent 2-D echocardiogram. Mean and peak gradients of 1527 mm of mercury respectively.  Normal LV function.

## 2013-11-19 DIAGNOSIS — H269 Unspecified cataract: Secondary | ICD-10-CM | POA: Diagnosis not present

## 2013-11-19 DIAGNOSIS — H25012 Cortical age-related cataract, left eye: Secondary | ICD-10-CM | POA: Diagnosis not present

## 2013-11-19 DIAGNOSIS — H2511 Age-related nuclear cataract, right eye: Secondary | ICD-10-CM | POA: Diagnosis not present

## 2013-11-20 DIAGNOSIS — H2511 Age-related nuclear cataract, right eye: Secondary | ICD-10-CM | POA: Diagnosis not present

## 2013-12-03 DIAGNOSIS — H25011 Cortical age-related cataract, right eye: Secondary | ICD-10-CM | POA: Diagnosis not present

## 2013-12-03 DIAGNOSIS — H2511 Age-related nuclear cataract, right eye: Secondary | ICD-10-CM | POA: Diagnosis not present

## 2013-12-03 DIAGNOSIS — H269 Unspecified cataract: Secondary | ICD-10-CM | POA: Diagnosis not present

## 2013-12-14 ENCOUNTER — Other Ambulatory Visit: Payer: Self-pay | Admitting: Cardiology

## 2013-12-17 DIAGNOSIS — H04121 Dry eye syndrome of right lacrimal gland: Secondary | ICD-10-CM | POA: Diagnosis not present

## 2013-12-17 DIAGNOSIS — H5711 Ocular pain, right eye: Secondary | ICD-10-CM | POA: Diagnosis not present

## 2013-12-24 DIAGNOSIS — H16141 Punctate keratitis, right eye: Secondary | ICD-10-CM | POA: Diagnosis not present

## 2013-12-27 ENCOUNTER — Encounter (HOSPITAL_COMMUNITY): Payer: Self-pay | Admitting: Cardiovascular Disease

## 2013-12-31 DIAGNOSIS — H1132 Conjunctival hemorrhage, left eye: Secondary | ICD-10-CM | POA: Diagnosis not present

## 2014-03-18 ENCOUNTER — Encounter: Payer: Self-pay | Admitting: Internal Medicine

## 2014-03-18 ENCOUNTER — Ambulatory Visit (INDEPENDENT_AMBULATORY_CARE_PROVIDER_SITE_OTHER): Payer: Medicare Other | Admitting: Internal Medicine

## 2014-03-18 VITALS — BP 126/82 | Temp 97.8°F | Ht 61.75 in | Wt 241.3 lb

## 2014-03-18 DIAGNOSIS — I10 Essential (primary) hypertension: Secondary | ICD-10-CM

## 2014-03-18 DIAGNOSIS — R1084 Generalized abdominal pain: Secondary | ICD-10-CM | POA: Diagnosis not present

## 2014-03-18 DIAGNOSIS — K219 Gastro-esophageal reflux disease without esophagitis: Secondary | ICD-10-CM

## 2014-03-18 DIAGNOSIS — R7301 Impaired fasting glucose: Secondary | ICD-10-CM

## 2014-03-18 DIAGNOSIS — E785 Hyperlipidemia, unspecified: Secondary | ICD-10-CM

## 2014-03-18 DIAGNOSIS — Z0000 Encounter for general adult medical examination without abnormal findings: Secondary | ICD-10-CM | POA: Diagnosis not present

## 2014-03-18 DIAGNOSIS — E669 Obesity, unspecified: Secondary | ICD-10-CM | POA: Diagnosis not present

## 2014-03-18 LAB — CBC WITH DIFFERENTIAL/PLATELET
BASOS ABS: 0 10*3/uL (ref 0.0–0.1)
BASOS PCT: 0.3 % (ref 0.0–3.0)
Eosinophils Absolute: 0.2 10*3/uL (ref 0.0–0.7)
Eosinophils Relative: 2.1 % (ref 0.0–5.0)
HEMATOCRIT: 40 % (ref 36.0–46.0)
HEMOGLOBIN: 13.6 g/dL (ref 12.0–15.0)
LYMPHS ABS: 2.5 10*3/uL (ref 0.7–4.0)
Lymphocytes Relative: 34.8 % (ref 12.0–46.0)
MCHC: 34 g/dL (ref 30.0–36.0)
MCV: 95.4 fl (ref 78.0–100.0)
MONOS PCT: 9.2 % (ref 3.0–12.0)
Monocytes Absolute: 0.7 10*3/uL (ref 0.1–1.0)
NEUTROS ABS: 3.8 10*3/uL (ref 1.4–7.7)
Neutrophils Relative %: 53.6 % (ref 43.0–77.0)
Platelets: 268 10*3/uL (ref 150.0–400.0)
RBC: 4.19 Mil/uL (ref 3.87–5.11)
RDW: 13.6 % (ref 11.5–15.5)
WBC: 7.1 10*3/uL (ref 4.0–10.5)

## 2014-03-18 LAB — HEMOGLOBIN A1C: Hgb A1c MFr Bld: 6.2 % (ref 4.6–6.5)

## 2014-03-18 LAB — HEPATIC FUNCTION PANEL
ALK PHOS: 62 U/L (ref 39–117)
ALT: 19 U/L (ref 0–35)
AST: 19 U/L (ref 0–37)
Albumin: 4.2 g/dL (ref 3.5–5.2)
BILIRUBIN DIRECT: 0.1 mg/dL (ref 0.0–0.3)
Total Bilirubin: 0.6 mg/dL (ref 0.2–1.2)
Total Protein: 7.2 g/dL (ref 6.0–8.3)

## 2014-03-18 LAB — BASIC METABOLIC PANEL
BUN: 16 mg/dL (ref 6–23)
CO2: 30 mEq/L (ref 19–32)
CREATININE: 0.87 mg/dL (ref 0.40–1.20)
Calcium: 10.2 mg/dL (ref 8.4–10.5)
Chloride: 104 mEq/L (ref 96–112)
GFR: 67.18 mL/min (ref >60.00)
Glucose, Bld: 92 mg/dL (ref 70–99)
Potassium: 4.3 mEq/L (ref 3.5–5.1)
Sodium: 141 mEq/L (ref 135–145)

## 2014-03-18 LAB — TSH: TSH: 0.96 u[IU]/mL (ref 0.35–4.50)

## 2014-03-18 LAB — LIPID PANEL
CHOL/HDL RATIO: 3
Cholesterol: 182 mg/dL (ref 0–200)
HDL: 53.6 mg/dL (ref >39.00)
LDL Cholesterol: 99 mg/dL (ref 0–99)
NONHDL: 128.4
Triglycerides: 148 mg/dL (ref 0.0–149.0)
VLDL: 29.6 mg/dL (ref 0.0–40.0)

## 2014-03-18 MED ORDER — ROSUVASTATIN CALCIUM 10 MG PO TABS: 10.0000 mg | ORAL_TABLET | Freq: Every day | ORAL | Status: DC

## 2014-03-18 NOTE — Patient Instructions (Signed)
Will notify you  of labs when available.  Healthy lifestyle includes : At least 150 minutes of exercise weeks  , weight at healthy levels, which is usually   BMI 19-25. Avoid trans fats and processed foods;  Increase fresh fruits and veges to 5 servings per day. And avoid sweet beverages including tea and juice. Mediterranean diet with olive oil and nuts have been noted to be heart and brain healthy . Avoid tobacco products . Limit  alcohol to  7 per week for women and 14 servings for men.  Get adequate sleep . Wear seat belts . Don't text and drive .       Why follow it? Research shows. . Those who follow the Mediterranean diet have a reduced risk of heart disease  . The diet is associated with a reduced incidence of Parkinson's and Alzheimer's diseases . People following the diet may have longer life expectancies and lower rates of chronic diseases  . The Dietary Guidelines for Americans recommends the Mediterranean diet as an eating plan to promote health and prevent disease  What Is the Mediterranean Diet?  . Healthy eating plan based on typical foods and recipes of Mediterranean-style cooking . The diet is primarily a plant based diet; these foods should make up a majority of meals   Starches - Plant based foods should make up a majority of meals - They are an important sources of vitamins, minerals, energy, antioxidants, and fiber - Choose whole grains, foods high in fiber and minimally processed items  - Typical grain sources include wheat, oats, barley, corn, brown rice, bulgar, farro, millet, polenta, couscous  - Various types of beans include chickpeas, lentils, fava beans, black beans, white beans   Fruits  Veggies - Large quantities of antioxidant rich fruits & veggies; 6 or more servings  - Vegetables can be eaten raw or lightly drizzled with oil and cooked  - Vegetables common to the traditional Mediterranean Diet include: artichokes, arugula, beets, broccoli, brussel  sprouts, cabbage, carrots, celery, collard greens, cucumbers, eggplant, kale, leeks, lemons, lettuce, mushrooms, okra, onions, peas, peppers, potatoes, pumpkin, radishes, rutabaga, shallots, spinach, sweet potatoes, turnips, zucchini - Fruits common to the Mediterranean Diet include: apples, apricots, avocados, cherries, clementines, dates, figs, grapefruits, grapes, melons, nectarines, oranges, peaches, pears, pomegranates, strawberries, tangerines  Fats - Replace butter and margarine with healthy oils, such as olive oil, canola oil, and tahini  - Limit nuts to no more than a handful a day  - Nuts include walnuts, almonds, pecans, pistachios, pine nuts  - Limit or avoid candied, honey roasted or heavily salted nuts - Olives are central to the [employer] - can be eaten whole or used in a variety of dishes   Meats Protein - Limiting red meat: no more than a few times a month - When eating red meat: choose lean cuts and keep the portion to the size of deck of cards - Eggs: approx. 0 to 4 times a week  - Fish and lean poultry: at least 2 a week  - Healthy protein sources include, chicken, [place], lean beef, lamb - Increase intake of seafood such as tuna, salmon, trout, mackerel, shrimp, scallops - Avoid or limit high fat processed meats such as sausage and bacon  Dairy - Include moderate amounts of low fat dairy products  - Focus on healthy dairy such as fat free yogurt, skim milk, low or reduced fat cheese - Limit dairy products higher in fat such as whole or 2% milk,  cheese, ice cream  Alcohol - Moderate amounts of red wine is ok  - No more than 5 oz daily for women (all ages) and men older than age 33  - No more than 10 oz of wine daily for men younger than 63  Other - Limit sweets and other desserts  - Use herbs and spices instead of salt to flavor foods  - Herbs and spices common to the traditional Mediterranean Diet include: basil, bay leaves, chives, cloves, cumin, fennel, garlic,  lavender, marjoram, mint, oregano, parsley, pepper, rosemary, sage, savory, sumac, tarragon, thyme   It's not just a diet, it's a lifestyle:  . The Mediterranean diet includes lifestyle factors typical of those in the region  . Foods, drinks and meals are best eaten with others and savored . Daily physical activity is important for overall good health . This could be strenuous exercise like running and aerobics . This could also be more leisurely activities such as walking, housework, yard-work, or taking the stairs . Moderation is the key; a balanced and healthy diet accommodates most foods and drinks . Consider portion sizes and frequency of consumption of certain foods   Meal Ideas & Options:  . Breakfast:  o Whole wheat toast or whole wheat English muffins with peanut butter & hard boiled egg o Steel cut oats topped with apples & cinnamon and skim milk  o Fresh fruit: banana, strawberries, melon, berries, peaches  o Smoothies: strawberries, bananas, greek yogurt, peanut butter o Low fat greek yogurt with blueberries and granola  o Egg white omelet with spinach and mushrooms o Breakfast couscous: whole wheat couscous, apricots, skim milk, cranberries  . Sandwiches:  o Hummus and grilled vegetables (peppers, zucchini, squash) on whole wheat bread   o Grilled chicken on whole wheat pita with lettuce, tomatoes, cucumbers or tzatziki  o Tuna salad on whole wheat bread: tuna salad made with greek yogurt, olives, red peppers, capers, green onions o Garlic rosemary lamb pita: lamb sauted with garlic, rosemary, salt & pepper; add lettuce, cucumber, greek yogurt to pita - flavor with lemon juice and black pepper  . Seafood:  o Mediterranean grilled salmon, seasoned with garlic, basil, parsley, lemon juice and black pepper o Shrimp, lemon, and spinach whole-grain pasta salad made with low fat greek yogurt  o Seared scallops with lemon orzo  o Seared tuna steaks seasoned salt, pepper, coriander  topped with tomato mixture of olives, tomatoes, olive oil, minced garlic, parsley, green onions and cappers  . Meats:  o Herbed greek chicken salad with kalamata olives, cucumber, feta  o Red bell peppers stuffed with spinach, bulgur, lean ground beef (or lentils) & topped with feta   o Kebabs: skewers of chicken, tomatoes, onions, zucchini, squash  o [place] burgers: made with red onions, mint, dill, lemon juice, feta cheese topped with roasted red peppers . Vegetarian o Cucumber salad: cucumbers, artichoke hearts, celery, red onion, feta cheese, tossed in olive oil & lemon juice  o Hummus and whole grain pita points with a greek salad (lettuce, tomato, feta, olives, cucumbers, red onion) o Lentil soup with celery, carrots made with vegetable broth, garlic, salt and pepper  o Tabouli salad: parsley, bulgur, mint, scallions, cucumbers, tomato, radishes, lemon juice, olive oil, salt and pepper.

## 2014-03-18 NOTE — Progress Notes (Signed)
[id number]                                               Pre visit review using our clinic review tool, if applicable. No additional management support is needed unless otherwise documented below in the visit note.  Chief Complaint  Patient presents with  . Medicare Wellness  . Hyperlipidemia   HPI: Patient comes in today for Preventive Medicare wellness visit .  no major changes  DJD back problematic and walks with cane  No recent falling Has colon polyps  diverticulitis flaring a bit no fever  Blood sees dr Earlean Shawl if getting out of hand . Also on zegerid  LIPIDS  Needs refill soon  Labs done in summer  CV  avs; ht to see cardiology tomorrow  No cp sob that is new  No syncope  Getting fu mammo following abnormals   Health Maintenance  Topic Date Due  . ZOSTAVAX  08/29/1997  . DEXA SCAN  08/30/2002  . INFLUENZA VACCINE  08/18/2013  . MAMMOGRAM  09/14/2014  . TETANUS/TDAP  01/01/2020  . COLONOSCOPY  04/28/2022  . PNEUMOCOCCAL POLYSACCHARIDE VACCINE AGE 76 AND OVER  Completed   Health Maintenance Review LIFESTYLE:  Exercise:   Cane outside  Back  Walk  Tobacco/ETS:no Alcohol: per day no Sugar beverages: no Sleep: about  Around 7-8  Drug use: no Bone density:  Has had one in past no fracture  Colonoscopy:   utd by report per dr Earlean Shawl Mammogram utd q 6 months     Hearing: ok  Vision:  No limitations at present . Last eye check  utd  Glaucoma and infection  Worse after sugery  Of cataracts .    Safety:  Has smoke detector and wears seat belts.  No firearms. No excess sun exposure. Sees dentist regularly.  Falls:  No   Advance directive :  Reviewed  Has one.  Memory: Felt to be good  , no concern from her or her family.  Depression: No anhedonia unusual crying or depressive  symptoms  Nutrition: Eats well balanced diet; adequate calcium and vitamin D. No swallowing chewing problems.  Injury: no major injuries in the last six months.  Other healthcare providers:  Reviewed today .  Social:  Lives with spouse married. No pets.   Preventive parameters: up-to-date  Reviewed   ADLS:   There are no problems or need for assistance  driving, feeding, obtaining food, dressing, toileting and bathing, managing money using phone. She is independent.   ROS:  GEN/ HEENT: No fever, significant weight changes sweats headaches vision problems hearing changes, CV/ PULM; No chest pain newshortness of breath cough, syncope,edema  change in exercise tolerance. GI /GU: No vomiting, change in bowel habits. No blood in the stool. No significant GU symptoms. SKIN/HEME: ,no acute skin rashes suspicious lesions or bleeding. No lymphadenopathy, nodules, masses.  NEURO/ PSYCH:  No neurologic signs such as weakness numbness. No depression anxiety. IMM/ Allergy: No unusual infections.  Allergy .   REST of 12 system review negative except as per HPI   Past Medical History  Diagnosis Date  . Hypertension   . Hyperlipidemia   . Generalized osteoarthrosis, involving multiple sites   . Impaired fasting glucose   . Obesity   . GERD (gastroesophageal reflux disease)     dilitation  . Personal history of  other diseases of digestive system   . Colon polyp   . Dyspnea   . PONV (postoperative nausea and vomiting)   . Coronary artery disease     aortic stenosis/mild per ECHO  . Aortic stenosis     Family History  Problem Relation Age of Onset  . Coronary artery disease      female- 1st degree relative  . Coronary artery disease      female- 1st degree relative  . Hypertension    . Hyperlipidemia    . Heart attack Mother     age 73's  . Heart attack Father     age 45's  . Stroke Brother     died   . Other Son     paraplegia 45  . Sleep apnea Son     History   Social  History  . Marital Status: Married    Spouse Name: N/A  . Number of Children: N/A  . Years of Education: N/A   Occupational History  . Retired     [employer] care of [employer]   Social History Main Topics  . Smoking status: Never Smoker   . Smokeless tobacco: Never Used     Comment: as a teenager  . Alcohol Use: No  . Drug Use: No  . Sexual Activity: Not on file     Comment: quit 40 yrs ago   Other Topics Concern  . None   Social History Narrative   hh of 3 -4  Married   [place] son   At home    Invalid son paraplegia and husband    And grandson recently     No pets   Fluctuating hh membors she cooks and prepares for them              Outpatient Encounter Prescriptions as of 03/18/2014  Medication Sig  . aspirin 81 MG tablet Take 81 mg by mouth daily.  Marland Kitchen CALCIUM PO Take 1 tablet by mouth daily.    . cholecalciferol (VITAMIN D) 1000 UNITS tablet Take 1,000 Units by mouth daily.    Marland Kitchen losartan-hydrochlorothiazide (HYZAAR) 50-12.5 MG per tablet Take 1 tablet by mouth  daily  . metoprolol succinate (TOPROL-XL) 50 MG 24 hr tablet Take 1 tablet by mouth  daily  . multivitamin (THERAGRAN) per tablet Take 1 tablet by mouth daily.   Marland Kitchen omeprazole-sodium bicarbonate (ZEGERID) 40-1100 MG per capsule Take 1 capsule by mouth daily. 1 capsule daily  . rosuvastatin (CRESTOR) 10 MG tablet Take 1 tablet (10 mg total) by mouth daily.  . vitamin E 400 UNIT capsule Take 400 Units by mouth daily.  . [DISCONTINUED] fluorouracil (EFUDEX) 5 % cream Apply 1 application topically 2 (two) times daily.   . [DISCONTINUED] rosuvastatin (CRESTOR) 10 MG tablet Take 1 tablet (10 mg total) by mouth daily.  . [DISCONTINUED] BESIVANCE 0.6 % SUSP   . [DISCONTINUED] DUREZOL 0.05 % EMUL   . [DISCONTINUED] ILEVRO 0.3 % SUSP     EXAM:  BP 126/82 mmHg  Temp(Src) 97.8 F (36.6 C) (Oral)  Ht 5' 1.75" (1.568 m)  Wt 241 lb 4.8 oz (109.453 kg)  BMI 44.52 kg/m2  Body mass index is 44.52  kg/(m^2).  Physical Exam: Vital signs reviewed UXN:ATFT is a well-developed well-nourished alert cooperative   who appears stated age in no acute distress.  Pleasant independent using cane  HEENT: normocephalic atraumatic , Eyes: PERRL EOM's full, conjunctiva clear, Nares: paten,t no deformity discharge or tenderness., Ears: no  deformity EAC's clear TMs with normal landmarks. Mouth: clear OP, no lesions, edema.  Moist mucous membranes. Dentition in adequate repair. Implants  NECK: supple without masses, thyromegaly or bruits. CHEST/PULM:  Clear to auscultation and percussion breath sounds equal no wheeze , rales or rhonchi. No chest wall deformities or tenderness. CV: PMI is nondisplaced, S1 S2 no gallops, 2/6 sem non radiating supine to neck otherwise  rubs. Peripheral pulses are full without delay.No JVD .  Breast: normal by inspection . No dimpling, discharge, masses, tenderness or discharge . ABDOMEN: Bowel sounds normal nontender  No guard or rebound, no hepato splenomegal no CVA tenderness.  No hernia.obvious  Mild tendernss llq no mass Extremtities:  No clubbing cyanosis or edema, no acute joint swelling or redness no focal atrophy NEURO:  Oriented x3, cranial nerves 3-12 appear to be intact, no obvious focal weakness,gait within normal limits no abnormal reflexes or asymmetrical SKIN: No acute rashes normal turgor, color, no bruising or petechiae. PSYCH: Oriented, good eye contact, no obvious depression anxiety, cognition and judgment appear normal. LN: no cervical axillary  adenopathy No noted deficits in memory, attention, and speech.   Lab Results  Component Value Date   WBC 6.2 10/08/2013   HGB 12.9 10/08/2013   HCT 38.6 10/08/2013   PLT 257 10/08/2013   GLUCOSE 108* 10/08/2013   CHOL 182 07/24/2013   TRIG 226.0* 07/24/2013   HDL 52.00 07/24/2013   LDLDIRECT 96.6 03/19/2013   LDLCALC 85 07/24/2013   ALT 24 01/15/2013   AST 20 01/15/2013   NA 140 10/08/2013   K 4.1  10/08/2013   CL 101 10/08/2013   CREATININE 0.86 10/08/2013   BUN 14 10/08/2013   CO2 29 10/08/2013   TSH 0.86 01/15/2013   INR 0.93 10/08/2013   HGBA1C 6.4 01/15/2013   Wt Readings from Last 3 Encounters:  03/18/14 241 lb 4.8 oz (109.453 kg)  10/25/13 242 lb 1.6 oz (109.816 kg)  10/10/13 241 lb (109.317 kg)    ASSESSMENT AND PLAN:  Discussed the following assessment and plan:  Medicare annual wellness visit, subsequent  Impaired fasting glucose - Plan: Basic metabolic panel, Hemoglobin A1c, CBC with Differential/Platelet, Hepatic function panel, Lipid panel, TSH  Essential hypertension - Plan: Basic metabolic panel, Hemoglobin A1c, CBC with Differential/Platelet, Hepatic function panel, Lipid panel, TSH  Hyperlipidemia - Plan: Basic metabolic panel, Hemoglobin A1c, CBC with Differential/Platelet, Hepatic function panel, Lipid panel, TSH  Generalized abdominal discomfort - Plan: Basic metabolic panel, Hemoglobin A1c, CBC with Differential/Platelet, Hepatic function panel, Lipid panel, TSH  Gastroesophageal reflux disease, esophagitis presence not specified - Plan: Basic metabolic panel, Hemoglobin A1c, CBC with Differential/Platelet, Hepatic function panel, Lipid panel, TSH  Obesity - Plan: Basic metabolic panel, Hemoglobin A1c, CBC with Differential/Platelet, Hepatic function panel, Lipid panel, TSH Counseled. lsi diet has to cook for paraplegic son and husband  Sometimes take out to home . Tries to eat healthier weight loss would help her joints  58mins > 50% spent counseling and coordinating care  Unrelated to wellness visit   Patient Care Team: Burnis Medin, MD as PCP - General Tanda Rockers, MD (Pulmonary Disease) Kerin Salen, MD (Orthopedic Surgery) Almedia Balls, MD (Orthopedic Surgery) Mayme Genta, MD (Gastroenterology) Lelon Perla, MD as Attending Physician (Cardiology) Marica Otter, OD (Optometry)  Patient Instructions   Will notify you  of labs  when available.  Healthy lifestyle includes : At least 150 minutes of exercise weeks  , weight at healthy levels, which is  usually   BMI 19-25. Avoid trans fats and processed foods;  Increase fresh fruits and veges to 5 servings per day. And avoid sweet beverages including tea and juice. Mediterranean diet with olive oil and nuts have been noted to be heart and brain healthy . Avoid tobacco products . Limit  alcohol to  7 per week for women and 14 servings for men.  Get adequate sleep . Wear seat belts . Don't text and drive .       Why follow it? Research shows. . Those who follow the Mediterranean diet have a reduced risk of heart disease  . The diet is associated with a reduced incidence of Parkinson's and Alzheimer's diseases . People following the diet may have longer life expectancies and lower rates of chronic diseases  . The Dietary Guidelines for Americans recommends the Mediterranean diet as an eating plan to promote health and prevent disease  What Is the Mediterranean Diet?  . Healthy eating plan based on typical foods and recipes of Mediterranean-style cooking . The diet is primarily a plant based diet; these foods should make up a majority of meals   Starches - Plant based foods should make up a majority of meals - They are an important sources of vitamins, minerals, energy, antioxidants, and fiber - Choose whole grains, foods high in fiber and minimally processed items  - Typical grain sources include wheat, oats, barley, corn, brown rice, bulgar, farro, millet, polenta, couscous  - Various types of beans include chickpeas, lentils, fava beans, black beans, white beans   Fruits  Veggies - Large quantities of antioxidant rich fruits & veggies; 6 or more servings  - Vegetables can be eaten raw or lightly drizzled with oil and cooked  - Vegetables common to the traditional Mediterranean Diet include: artichokes, arugula, beets, broccoli, brussel sprouts, cabbage, carrots,  celery, collard greens, cucumbers, eggplant, kale, leeks, lemons, lettuce, mushrooms, okra, onions, peas, peppers, potatoes, pumpkin, radishes, rutabaga, shallots, spinach, sweet potatoes, turnips, zucchini - Fruits common to the Mediterranean Diet include: apples, apricots, avocados, cherries, clementines, dates, figs, grapefruits, grapes, melons, nectarines, oranges, peaches, pears, pomegranates, strawberries, tangerines  Fats - Replace butter and margarine with healthy oils, such as olive oil, canola oil, and tahini  - Limit nuts to no more than a handful a day  - Nuts include walnuts, almonds, pecans, pistachios, pine nuts  - Limit or avoid candied, honey roasted or heavily salted nuts - Olives are central to the [employer] - can be eaten whole or used in a variety of dishes   Meats Protein - Limiting red meat: no more than a few times a month - When eating red meat: choose lean cuts and keep the portion to the size of deck of cards - Eggs: approx. 0 to 4 times a week  - Fish and lean poultry: at least 2 a week  - Healthy protein sources include, chicken, [place], lean beef, lamb - Increase intake of seafood such as tuna, salmon, trout, mackerel, shrimp, scallops - Avoid or limit high fat processed meats such as sausage and bacon  Dairy - Include moderate amounts of low fat dairy products  - Focus on healthy dairy such as fat free yogurt, skim milk, low or reduced fat cheese - Limit dairy products higher in fat such as whole or 2% milk, cheese, ice cream  Alcohol - Moderate amounts of red wine is ok  - No more than 5 oz daily for women (all ages) and men older  than age 66  - No more than 10 oz of wine daily for men younger than 13  Other - Limit sweets and other desserts  - Use herbs and spices instead of salt to flavor foods  - Herbs and spices common to the traditional Mediterranean Diet include: basil, bay leaves, chives, cloves, cumin, fennel, garlic, lavender, marjoram, mint,  oregano, parsley, pepper, rosemary, sage, savory, sumac, tarragon, thyme   It's not just a diet, it's a lifestyle:  . The Mediterranean diet includes lifestyle factors typical of those in the region  . Foods, drinks and meals are best eaten with others and savored . Daily physical activity is important for overall good health . This could be strenuous exercise like running and aerobics . This could also be more leisurely activities such as walking, housework, yard-work, or taking the stairs . Moderation is the key; a balanced and healthy diet accommodates most foods and drinks . Consider portion sizes and frequency of consumption of certain foods   Meal Ideas & Options:  . Breakfast:  o Whole wheat toast or whole wheat English muffins with peanut butter & hard boiled egg o Steel cut oats topped with apples & cinnamon and skim milk  o Fresh fruit: banana, strawberries, melon, berries, peaches  o Smoothies: strawberries, bananas, greek yogurt, peanut butter o Low fat greek yogurt with blueberries and granola  o Egg white omelet with spinach and mushrooms o Breakfast couscous: whole wheat couscous, apricots, skim milk, cranberries  . Sandwiches:  o Hummus and grilled vegetables (peppers, zucchini, squash) on whole wheat bread   o Grilled chicken on whole wheat pita with lettuce, tomatoes, cucumbers or tzatziki  o Tuna salad on whole wheat bread: tuna salad made with greek yogurt, olives, red peppers, capers, green onions o Garlic rosemary lamb pita: lamb sauted with garlic, rosemary, salt & pepper; add lettuce, cucumber, greek yogurt to pita - flavor with lemon juice and black pepper  . Seafood:  o Mediterranean grilled salmon, seasoned with garlic, basil, parsley, lemon juice and black pepper o Shrimp, lemon, and spinach whole-grain pasta salad made with low fat greek yogurt  o Seared scallops with lemon orzo  o Seared tuna steaks seasoned salt, pepper, coriander topped with tomato  mixture of olives, tomatoes, olive oil, minced garlic, parsley, green onions and cappers  . Meats:  o Herbed greek chicken salad with kalamata olives, cucumber, feta  o Red bell peppers stuffed with spinach, bulgur, lean ground beef (or lentils) & topped with feta   o Kebabs: skewers of chicken, tomatoes, onions, zucchini, squash  o [place] burgers: made with red onions, mint, dill, lemon juice, feta cheese topped with roasted red peppers . Vegetarian o Cucumber salad: cucumbers, artichoke hearts, celery, red onion, feta cheese, tossed in olive oil & lemon juice  o Hummus and whole grain pita points with a greek salad (lettuce, tomato, feta, olives, cucumbers, red onion) o Lentil soup with celery, carrots made with vegetable broth, garlic, salt and pepper  o Tabouli salad: parsley, bulgur, mint, scallions, cucumbers, tomato, radishes, lemon juice, olive oil, salt and pepper.         [name]. [name] M.D.

## 2014-03-19 ENCOUNTER — Encounter: Payer: Self-pay | Admitting: Cardiology

## 2014-03-19 ENCOUNTER — Ambulatory Visit (INDEPENDENT_AMBULATORY_CARE_PROVIDER_SITE_OTHER): Payer: Medicare Other | Admitting: Cardiology

## 2014-03-19 VITALS — BP 140/70 | HR 66 | Ht 62.0 in | Wt 243.6 lb

## 2014-03-19 DIAGNOSIS — I2583 Coronary atherosclerosis due to lipid rich plaque: Secondary | ICD-10-CM

## 2014-03-19 DIAGNOSIS — R0602 Shortness of breath: Secondary | ICD-10-CM | POA: Diagnosis not present

## 2014-03-19 DIAGNOSIS — I359 Nonrheumatic aortic valve disorder, unspecified: Secondary | ICD-10-CM

## 2014-03-19 DIAGNOSIS — R920 Mammographic microcalcification found on diagnostic imaging of breast: Secondary | ICD-10-CM | POA: Diagnosis not present

## 2014-03-19 DIAGNOSIS — Z09 Encounter for follow-up examination after completed treatment for conditions other than malignant neoplasm: Secondary | ICD-10-CM | POA: Diagnosis not present

## 2014-03-19 DIAGNOSIS — I10 Essential (primary) hypertension: Secondary | ICD-10-CM | POA: Diagnosis not present

## 2014-03-19 DIAGNOSIS — I2510 Atherosclerotic heart disease of native coronary artery without angina pectoris: Secondary | ICD-10-CM | POA: Insufficient documentation

## 2014-03-19 HISTORY — DX: Atherosclerotic heart disease of native coronary artery without angina pectoris: I25.10

## 2014-03-19 NOTE — Assessment & Plan Note (Signed)
Mild on most recent catheterization. Continue aspirin and statin.

## 2014-03-19 NOTE — Assessment & Plan Note (Signed)
Continue statin. 

## 2014-03-19 NOTE — Assessment & Plan Note (Addendum)
Mild on most recent echocardiogram. Follow-up echocardiogram when she returns in 1 year.

## 2014-03-19 NOTE — Progress Notes (Signed)
HPI: FU aortic stenosis. Placed on beta blocker previously secondary to PVCs. Nuclear study 9/15 showed anterior ischemia, shifting breast attenuation not excluded; EF 67. Echo 9/15 showed normal LV function, mild AS with mean gradient of 15 mmHg, mild LAE and mild MR. Cardiac catheterization September 2015 showed a 20% left main and no other obstructive disease. Ejection fraction 60-65%. Mild to moderate aortic stenosis. Since last seen, the patient has dyspnea with more extreme activities but not with routine activities. It is relieved with rest. It is not associated with chest pain. There is no orthopnea, PND or pedal edema. There is no syncope or palpitations. There is no exertional chest pain.   Current Outpatient Prescriptions  Medication Sig Dispense Refill  . aspirin 81 MG tablet Take 81 mg by mouth daily.    Marland Kitchen CALCIUM PO Take 1 tablet by mouth daily.      . cholecalciferol (VITAMIN D) 1000 UNITS tablet Take 1,000 Units by mouth daily.      Marland Kitchen losartan-hydrochlorothiazide (HYZAAR) 50-12.5 MG per tablet Take 1 tablet by mouth  daily 90 tablet 3  . metoprolol succinate (TOPROL-XL) 50 MG 24 hr tablet Take 1 tablet by mouth  daily 30 tablet 6  . multivitamin (THERAGRAN) per tablet Take 1 tablet by mouth daily.     Marland Kitchen omeprazole-sodium bicarbonate (ZEGERID) 40-1100 MG per capsule Take 1 capsule by mouth daily. 1 capsule daily    . rosuvastatin (CRESTOR) 10 MG tablet Take 1 tablet (10 mg total) by mouth daily. 90 tablet 3  . vitamin E 400 UNIT capsule Take 400 Units by mouth daily.     No current facility-administered medications for this visit.     Past Medical History  Diagnosis Date  . Hypertension   . Hyperlipidemia   . Generalized osteoarthrosis, involving multiple sites   . Impaired fasting glucose   . Obesity   . GERD (gastroesophageal reflux disease)     dilitation  . Personal history of other diseases of digestive system   . Colon polyp   . Dyspnea   . PONV  (postoperative nausea and vomiting)   . Coronary artery disease     aortic stenosis/mild per ECHO  . Aortic stenosis     Past Surgical History  Procedure Laterality Date  . Appendectomy    . Cholecystectomy    . Knee surgery      rt  . Foot surgery      rt  . Tonsillectomy    . Total knee arthroplasty  02/01/2011    Procedure: TOTAL KNEE ARTHROPLASTY;  Surgeon: Kerin Salen;  Location: [place];  Service: Orthopedics;  Laterality: Right;  DEPUY/SIGMA  . Left and right heart catheterization with coronary angiogram N/A 10/10/2013    Procedure: LEFT AND RIGHT HEART CATHETERIZATION WITH CORONARY ANGIOGRAM;  Surgeon: Burnell Blanks, MD;  Location: [facility] CATH LAB;  Service: Cardiovascular;  Laterality: N/A;    History   Social History  . Marital Status: Married    Spouse Name: N/A  . Number of Children: N/A  . Years of Education: N/A   Occupational History  . Retired     [employer] of [employer]   Social History Main Topics  . Smoking status: Never Smoker   . Smokeless tobacco: Never Used     Comment: as a teenager  . Alcohol Use: No  . Drug Use: No  . Sexual Activity: Not on file     Comment: quit 40 yrs  ago   Other Topics Concern  . Not on file   Social History Narrative   hh of 3 -4  Married   [place] son   At home    Invalid son paraplegia and husband    And grandson recently     No pets   Fluctuating hh membors she cooks and prepares for them              ROS: no fevers or chills, productive cough, hemoptysis, dysphasia, odynophagia, melena, hematochezia, dysuria, hematuria, rash, seizure activity, orthopnea, PND, pedal edema, claudication. Remaining systems are negative.  Physical Exam: Well-developed obese in no acute distress.  Skin is warm and dry.  HEENT is normal.  Neck is supple.  Chest is clear to auscultation with normal expansion.  Cardiovascular exam is regular rate and rhythm. 2/6 systolic murmur LSB Abdominal exam nontender or  distended. No masses palpated. Extremities show no edema. neuro grossly intact  ECG normal sinus rhythm at a rate of 66. No ST changes.

## 2014-03-19 NOTE — Assessment & Plan Note (Signed)
Most likely multifactorial including obesity hypoventilation syndrome and obstructive sleep apnea.

## 2014-03-19 NOTE — Assessment & Plan Note (Signed)
Blood pressure controlled. Continue present medications. 

## 2014-03-19 NOTE — Patient Instructions (Signed)
Your physician wants you to follow-up in: ONE YEAR WITH DR CRENSHAW You will receive a reminder letter in the mail two months in advance. If you don't receive a letter, please call our office to schedule the follow-up appointment.  

## 2014-03-27 DIAGNOSIS — R197 Diarrhea, unspecified: Secondary | ICD-10-CM | POA: Diagnosis not present

## 2014-03-27 DIAGNOSIS — R1031 Right lower quadrant pain: Secondary | ICD-10-CM | POA: Diagnosis not present

## 2014-04-01 DIAGNOSIS — R197 Diarrhea, unspecified: Secondary | ICD-10-CM | POA: Diagnosis not present

## 2014-04-03 DIAGNOSIS — L821 Other seborrheic keratosis: Secondary | ICD-10-CM | POA: Diagnosis not present

## 2014-04-03 DIAGNOSIS — L814 Other melanin hyperpigmentation: Secondary | ICD-10-CM | POA: Diagnosis not present

## 2014-04-03 DIAGNOSIS — Z85828 Personal history of other malignant neoplasm of skin: Secondary | ICD-10-CM | POA: Diagnosis not present

## 2014-04-03 DIAGNOSIS — L570 Actinic keratosis: Secondary | ICD-10-CM | POA: Diagnosis not present

## 2014-04-10 ENCOUNTER — Encounter: Payer: Self-pay | Admitting: Internal Medicine

## 2014-04-16 ENCOUNTER — Telehealth: Payer: Self-pay | Admitting: Cardiology

## 2014-04-16 NOTE — Telephone Encounter (Signed)
Pt called in stating that she only received a 30 day supply from [facility] when she normally receives a 90 day supply with 3 refills. Can you call the pt and straighten this out .  Thank

## 2014-04-17 MED ORDER — METOPROLOL SUCCINATE ER 50 MG PO TB24: ORAL_TABLET | ORAL | Status: DC

## 2014-04-17 NOTE — Telephone Encounter (Signed)
New prescription for the metoprolol sent to [facility] for 90 day supply.

## 2014-04-30 DIAGNOSIS — K589 Irritable bowel syndrome without diarrhea: Secondary | ICD-10-CM | POA: Diagnosis not present

## 2014-06-18 DIAGNOSIS — H04123 Dry eye syndrome of bilateral lacrimal glands: Secondary | ICD-10-CM | POA: Diagnosis not present

## 2014-09-13 DIAGNOSIS — R921 Mammographic calcification found on diagnostic imaging of breast: Secondary | ICD-10-CM | POA: Diagnosis not present

## 2014-09-13 LAB — HM MAMMOGRAPHY

## 2014-09-16 ENCOUNTER — Encounter: Payer: Self-pay | Admitting: Family Medicine

## 2014-09-24 ENCOUNTER — Encounter: Payer: Self-pay | Admitting: Internal Medicine

## 2014-10-30 ENCOUNTER — Other Ambulatory Visit: Payer: Self-pay | Admitting: Cardiology

## 2014-12-17 DIAGNOSIS — K219 Gastro-esophageal reflux disease without esophagitis: Secondary | ICD-10-CM | POA: Diagnosis not present

## 2014-12-17 DIAGNOSIS — R1310 Dysphagia, unspecified: Secondary | ICD-10-CM | POA: Diagnosis not present

## 2014-12-31 DIAGNOSIS — K219 Gastro-esophageal reflux disease without esophagitis: Secondary | ICD-10-CM | POA: Diagnosis not present

## 2014-12-31 DIAGNOSIS — R1310 Dysphagia, unspecified: Secondary | ICD-10-CM | POA: Diagnosis not present

## 2015-01-07 DIAGNOSIS — Z23 Encounter for immunization: Secondary | ICD-10-CM | POA: Diagnosis not present

## 2015-03-06 ENCOUNTER — Other Ambulatory Visit: Payer: Self-pay | Admitting: Cardiology

## 2015-03-06 NOTE — Telephone Encounter (Signed)
Rx request sent to pharmacy.  

## 2015-04-03 DIAGNOSIS — D2272 Melanocytic nevi of left lower limb, including hip: Secondary | ICD-10-CM | POA: Diagnosis not present

## 2015-04-03 DIAGNOSIS — L821 Other seborrheic keratosis: Secondary | ICD-10-CM | POA: Diagnosis not present

## 2015-04-03 DIAGNOSIS — L814 Other melanin hyperpigmentation: Secondary | ICD-10-CM | POA: Diagnosis not present

## 2015-04-03 DIAGNOSIS — D2271 Melanocytic nevi of right lower limb, including hip: Secondary | ICD-10-CM | POA: Diagnosis not present

## 2015-04-03 DIAGNOSIS — L570 Actinic keratosis: Secondary | ICD-10-CM | POA: Diagnosis not present

## 2015-04-03 DIAGNOSIS — D1801 Hemangioma of skin and subcutaneous tissue: Secondary | ICD-10-CM | POA: Diagnosis not present

## 2015-04-03 DIAGNOSIS — Z85828 Personal history of other malignant neoplasm of skin: Secondary | ICD-10-CM | POA: Diagnosis not present

## 2015-04-07 NOTE — Progress Notes (Signed)
HPI: FU aortic stenosis. Placed on beta blocker previously secondary to PVCs. Nuclear study 9/15 showed anterior ischemia, shifting breast attenuation not excluded; EF 67. Echo 9/15 showed normal LV function, mild AS with mean gradient of 15 mmHg, mild LAE and mild MR. Cardiac catheterization September 2015 showed a 20% left main and no other obstructive disease. Ejection fraction 60-65%. Mild to moderate aortic stenosis. Since last seen, The patient has dyspnea on exertion unchanged. No orthopnea or PND. She does not have exertional chest pain or syncope. She has occasional indigestion.  Current Outpatient Prescriptions  Medication Sig Dispense Refill  . aspirin 81 MG tablet Take 81 mg by mouth daily.    Marland Kitchen CALCIUM PO Take 1 tablet by mouth daily.      . cholecalciferol (VITAMIN D) 1000 UNITS tablet Take 1,000 Units by mouth daily.      Marland Kitchen losartan-hydrochlorothiazide (HYZAAR) 50-12.5 MG tablet Take 1 tablet by mouth  daily 90 tablet 0  . metoprolol succinate (TOPROL-XL) 50 MG 24 hr tablet Take 1 tablet by mouth  daily 90 tablet 0  . multivitamin (THERAGRAN) per tablet Take 1 tablet by mouth daily.     Marland Kitchen omeprazole-sodium bicarbonate (ZEGERID) 40-1100 MG per capsule Take 1 capsule by mouth daily. 1 capsule daily    . rosuvastatin (CRESTOR) 10 MG tablet Take 1 tablet (10 mg total) by mouth daily. 90 tablet 3  . vitamin E 400 UNIT capsule Take 400 Units by mouth daily.     No current facility-administered medications for this visit.     Past Medical History  Diagnosis Date  . Hypertension   . Hyperlipidemia   . Generalized osteoarthrosis, involving multiple sites   . Impaired fasting glucose   . Obesity   . GERD (gastroesophageal reflux disease)     dilitation  . Personal history of other diseases of digestive system   . Colon polyp   . Dyspnea   . PONV (postoperative nausea and vomiting)   . Coronary artery disease     aortic stenosis/mild per ECHO  . Aortic stenosis      Past Surgical History  Procedure Laterality Date  . Appendectomy    . Cholecystectomy    . Knee surgery      rt  . Foot surgery      rt  . Tonsillectomy    . Total knee arthroplasty  02/01/2011    Procedure: TOTAL KNEE ARTHROPLASTY;  Surgeon: Kerin Salen;  Location: [place];  Service: Orthopedics;  Laterality: Right;  DEPUY/SIGMA  . Left and right heart catheterization with coronary angiogram N/A 10/10/2013    Procedure: LEFT AND RIGHT HEART CATHETERIZATION WITH CORONARY ANGIOGRAM;  Surgeon: Burnell Blanks, MD;  Location: [facility] CATH LAB;  Service: Cardiovascular;  Laterality: N/A;    Social History   Social History  . Marital Status: Married    Spouse Name: N/A  . Number of Children: N/A  . Years of Education: N/A   Occupational History  . Retired     [employer]   Social History Main Topics  . Smoking status: Never Smoker   . Smokeless tobacco: Never Used     Comment: as a teenager  . Alcohol Use: No  . Drug Use: No  . Sexual Activity: Not on file     Comment: quit 40 yrs ago   Other Topics Concern  . Not on file   Social History Narrative   hh of 3 -4  Married   [place] son   At home    Invalid son paraplegia and husband    And grandson recently     No pets   Fluctuating hh membors she cooks and prepares for them              Family History  Problem Relation Age of Onset  . Coronary artery disease      female- 1st degree relative  . Coronary artery disease      female- 1st degree relative  . Hypertension    . Hyperlipidemia    . Heart attack Mother     age 26's  . Heart attack Father     age 13's  . Stroke Brother     died   . Other Son     paraplegia 64  . Sleep apnea Son     ROS: Ankle pain but no fevers or chills, productive cough, hemoptysis, dysphasia, odynophagia, melena, hematochezia, dysuria, hematuria, rash, seizure activity, orthopnea, PND, pedal edema, claudication. Remaining systems are negative.  Physical  Exam: Well-developed obese in no acute distress.  Skin is warm and dry.  HEENT is normal.  Neck is supple.  Chest is clear to auscultation with normal expansion.  Cardiovascular exam is regular rate and rhythm. 2/6 systolic murmur LSB Abdominal exam nontender or distended. No masses palpated. Extremities show trace edema. neuro grossly intact  ECG Normal sinus rhythm at a rate of 67. No ST changes.

## 2015-04-11 ENCOUNTER — Encounter: Payer: Self-pay | Admitting: Cardiology

## 2015-04-11 ENCOUNTER — Ambulatory Visit (INDEPENDENT_AMBULATORY_CARE_PROVIDER_SITE_OTHER): Payer: Medicare Other | Admitting: Cardiology

## 2015-04-11 VITALS — BP 116/78 | HR 67 | Ht 62.0 in | Wt 239.0 lb

## 2015-04-11 DIAGNOSIS — I359 Nonrheumatic aortic valve disorder, unspecified: Secondary | ICD-10-CM | POA: Diagnosis not present

## 2015-04-11 DIAGNOSIS — R0602 Shortness of breath: Secondary | ICD-10-CM

## 2015-04-11 NOTE — Assessment & Plan Note (Signed)
Plan repeat echocardiogram. I have explained that she may require aortic valve replacement in the future.

## 2015-04-11 NOTE — Assessment & Plan Note (Signed)
Continue aspirin and statin. 

## 2015-04-11 NOTE — Assessment & Plan Note (Signed)
Blood pressure controlled. Continue present medications. 

## 2015-04-11 NOTE — Assessment & Plan Note (Signed)
Discussed weight loss. 

## 2015-04-11 NOTE — Assessment & Plan Note (Signed)
Likely multifactorial including obesity hypoventilation syndrome and deconditioning. Check echocardiogram to reassess aortic stenosis.

## 2015-04-11 NOTE — Patient Instructions (Signed)
Medication Instructions:   NO CHANGE.   Testing/Procedures:  Your physician has requested that you have an echocardiogram. Echocardiography is a painless test that uses sound waves to create images of your heart. It provides your doctor with information about the size and shape of your heart and how well your heart's chambers and valves are working. This procedure takes approximately one hour. There are no restrictions for this procedure.    Follow-Up:  Your physician wants you to follow-up in: [place]. You will receive a reminder letter in the mail two months in advance. If you don't receive a letter, please call our office to schedule the follow-up appointment.   If you need a refill on your cardiac medications before your next appointment, please call your pharmacy.

## 2015-04-11 NOTE — Assessment & Plan Note (Signed)
Continue statin. 

## 2015-04-15 NOTE — Progress Notes (Signed)
Chief Complaint  Patient presents with  . Medicare Wellness    meds  . Hyperlipidemia    HPI: Anna Gamble 80 y.o. comes in today for Preventive Medicare wellness visit  And    Chronic disease management   management.Since last visit.Sad sis passed last month   Aortic stenosis .  aortic idssection.  And seconcdary  Renal failure .  She herself  Followed cards forav disease  Seen 3 24 for sob pof decon and multifactorial   To get repeat echo soon. LIPID med no se  MS arthritis is problematic in activity level sees dr Lynann Bologna if needed  GI Dr Earlean Shawl    Health Maintenance  Topic Date Due  . ZOSTAVAX  08/29/1997  . DEXA SCAN  08/30/2002  . INFLUENZA VACCINE  08/19/2015  . MAMMOGRAM  09/13/2015  . TETANUS/TDAP  01/01/2020  . PNA vac Low Risk Adult  Completed   Health Maintenance Review LIFESTYLE:  TAD no  Sugar beverages: no  Not a lot of exercise  Sleep: about  7 hours  .      MEDICARE DOCUMENT QUESTIONS  TO SCAN  Hearing: ok  Vision:  No limitations at present . Last eye check UTD  Safety:  Has smoke detector and wears seat belts.  No firearms. No excess sun exposure. Sees dentist regularly.  Falls: n  Advance directive :  Reviewed  Has one.  Memory: Felt to be good  , no concern from her or her family.  Depression: No anhedonia unusual crying or depressive symptoms  Nutrition: Eats well balanced diet; adequate calcium and vitamin D. No swallowing chewing problems.  Injury: no major injuries in the last six months.  Other healthcare providers:  Reviewed today .  Social:  Lives hh of 4-6 paralyzed son grandson with anger problems doing better  No pets.   Preventive parameters: up-to-date  Reviewed   ADLS:   There are no problems or need for assistance  driving, feeding, obtaining food, dressing, toileting and bathing, managing money using phone. She is independent. Limited by foor pain    ROS:  GEN/ HEENT: No fever, significant weight changes sweats  headaches vision problems hearing changes, CV/ PULM; No chest pain shortness of breath cough, syncope,edema  change in exercise tolerance. GI /GU: No adominal pain, vomiting, change in bowel habits. No blood in the stool. No significant GU symptoms. SKIN/HEME: ,no acute skin rashes suspicious lesions or bleeding. No lymphadenopathy, nodules, masses.  NEURO/ PSYCH:  No neurologic signs such as weakness numbness. No depression anxiety. IMM/ Allergy: No unusual infections.  Allergy .   REST of 12 system review negative except as per HPI   Past Medical History  Diagnosis Date  . Hypertension   . Hyperlipidemia   . Generalized osteoarthrosis, involving multiple sites   . Impaired fasting glucose   . Obesity   . GERD (gastroesophageal reflux disease)     dilitation  . Personal history of other diseases of digestive system   . Colon polyp   . Dyspnea   . PONV (postoperative nausea and vomiting)   . Coronary artery disease     aortic stenosis/mild per ECHO  . Aortic stenosis     Family History  Problem Relation Age of Onset  . Coronary artery disease      female- 1st degree relative  . Coronary artery disease      female- 1st degree relative  . Hypertension    . Hyperlipidemia    .  Heart attack Mother     age 46's  . Heart attack Father     age 19's  . Stroke Brother     died   . Other Son     paraplegia 76  . Sleep apnea Son     Social History   Social History  . Marital Status: Married    Spouse Name: N/A  . Number of Children: N/A  . Years of Education: N/A   Occupational History  . Retired     [employer] care of [employer]   Social History Main Topics  . Smoking status: Never Smoker   . Smokeless tobacco: Never Used     Comment: as a teenager  . Alcohol Use: No  . Drug Use: No  . Sexual Activity: Not Asked     Comment: quit 40 yrs ago   Other Topics Concern  . None   Social History Narrative   hh of 3 -4  Married   [place] son   At home    Invalid son  paraplegia and husband    And grandson recently     No pets   Fluctuating hh membors she cooks and prepares for them              Outpatient Encounter Prescriptions as of 04/16/2015  Medication Sig  . aspirin 81 MG tablet Take 81 mg by mouth daily.  Marland Kitchen CALCIUM PO Take 1 tablet by mouth daily.    . cholecalciferol (VITAMIN D) 1000 UNITS tablet Take 1,000 Units by mouth daily.    Marland Kitchen losartan-hydrochlorothiazide (HYZAAR) 50-12.5 MG tablet Take 1 tablet by mouth  daily  . metoprolol succinate (TOPROL-XL) 50 MG 24 hr tablet Take 1 tablet by mouth  daily  . multivitamin (THERAGRAN) per tablet Take 1 tablet by mouth daily.   Marland Kitchen omeprazole-sodium bicarbonate (ZEGERID) 40-1100 MG per capsule Take 1 capsule by mouth daily. 1 capsule daily  . rosuvastatin (CRESTOR) 10 MG tablet Take 1 tablet (10 mg total) by mouth daily.  . vitamin E 400 UNIT capsule Take 400 Units by mouth daily.  . [DISCONTINUED] rosuvastatin (CRESTOR) 10 MG tablet Take 1 tablet (10 mg total) by mouth daily.   No facility-administered encounter medications on file as of 04/16/2015.    EXAM:  BP 126/80 mmHg  Temp(Src) 98 F (36.7 C) (Oral)  Ht '5\' 2"'$  (1.575 m)  Wt 239 lb (108.41 kg)  BMI 43.70 kg/m2  Body mass index is 43.7 kg/(m^2).  Physical Exam: Vital signs reviewed [id number] is a well-developed well-nourished alert cooperative   who appears stated age in no acute distress.  Has cane  Ambulatory  HEENT: normocephalic atraumatic , Eyes: PERRL EOM's full, conjunctiva clear, Nares: paten,t no deformity discharge or tenderness., Ears: no deformity EAC's clear TMs with normal landmarks. Mouth: clear OP, no lesions, edema.  Moist mucous membranes. Dentition in adequate repair. NECK: supple without masses, thyromegaly or bruits. CHEST/PULM:  Clear to auscultation and percussion breath sounds equal no wheeze , rales or rhonchi. No chest wall deformities or tenderness.Breast: normal by inspection . No dimpling, discharge, masses,  tenderness or discharge . CV: PMI is nondisplaced, S1 S2 no gallops, 2-3 harsh m usp bilatrally no, rubs. Peripheral pulses are  without delay.No JVD .  ABDOMEN: Bowel sounds normal nontender  No guard or rebound, no hepato splenomegal no CVA tenderness.   Extremtities:  No clubbing cyanosis or edema, no acute joint swelling or redness no focal atrophy NEURO:  Oriented x3, cranial  nerves 3-12 appear to be intact, no obvious focal weakness,gait  Mildly antalgic but steady  SKIN: No acute rashes normal turgor, color, no bruising or petechiae. PSYCH: Oriented, good eye contact, no obvious depression anxiety, cognition and judgment appear normal. LN: no cervical axillary inguinal adenopathy No noted deficits in memory, attention, and speech.   Lab Results  Component Value Date   WBC 7.1 04/16/2015   HGB 13.4 04/16/2015   HCT 39.5 04/16/2015   PLT 274.0 04/16/2015   GLUCOSE 97 04/16/2015   CHOL 205* 04/16/2015   TRIG 147.0 04/16/2015   HDL 60.90 04/16/2015   LDLDIRECT 96.6 03/19/2013   LDLCALC 115* 04/16/2015   ALT 22 04/16/2015   AST 19 04/16/2015   NA 141 04/16/2015   K 4.0 04/16/2015   CL 101 04/16/2015   CREATININE 0.76 04/16/2015   BUN 14 04/16/2015   CO2 34* 04/16/2015   TSH 1.05 04/16/2015   INR 0.93 10/08/2013   HGBA1C 6.3 04/16/2015    ASSESSMENT AND PLAN:  Discussed the following assessment and plan:  Medicare annual wellness visit, subsequent  Essential hypertension - controlled - Plan: Lipid panel, TSH, Basic metabolic panel, CBC with Differential/Platelet, Hepatic function panel, Hemoglobin A1c  Impaired fasting glucose - monitor today lsi disc - Plan: Lipid panel, TSH, Basic metabolic panel, CBC with Differential/Platelet, Hepatic function panel, Hemoglobin A1c  Hyperlipidemia - refill med and check lipid status - Plan: Lipid panel, TSH, Basic metabolic panel, CBC with Differential/Platelet, Hepatic function panel, Hemoglobin A1c  Aortic stenosis - familial ?  sis had same   Need for 23-polyvalent pneumococcal polysaccharide vaccine - Plan: Pneumococcal polysaccharide vaccine 23-valent greater than or equal to 2yo subcutaneous/IM  Patient Care Team: [name], MD as PCP - General Nyoka Cowden, MD (Pulmonary Disease) Gean Birchwood, MD (Orthopedic Surgery) Lunette Stands, MD (Orthopedic Surgery) Sharrell Ku, MD (Gastroenterology) Lewayne Bunting, MD as Attending Physician (Cardiology) Blima Ledger, OD (Optometry)  Patient Instructions  Work of on healthy weight loss   Will help sugar and joints and reflux .  Will notify you  of labs when available.  Mediterranean type diet is the healthiest   Heart-Healthy Eating Plan Many factors influence your heart health, including eating and exercise habits. Heart (coronary) risk increases with abnormal blood fat (lipid) levels. Heart-healthy meal planning includes limiting unhealthy fats, increasing healthy fats, and making other small dietary changes. This includes maintaining a healthy body weight to help keep lipid levels within a normal range. WHAT IS MY PLAN?  Your health care provider recommends that you: Get no more than _________% of the total calories in your daily diet from fat. Limit your intake of saturated fat to less than _________% of your total calories each day. Limit the amount of cholesterol in your diet to less than _________ mg per day. WHAT TYPES OF FAT SHOULD I CHOOSE? Choose healthy fats more often. Choose monounsaturated and polyunsaturated fats, such as olive oil and canola oil, flaxseeds, walnuts, almonds, and seeds. Eat more omega-3 fats. Good choices include salmon, mackerel, sardines, tuna, flaxseed oil, and ground flaxseeds. Aim to eat fish at least two times each week. Limit saturated fats. Saturated fats are primarily found in animal products, such as meats, butter, and cream. Plant sources of saturated fats include palm oil, palm kernel oil, and coconut oil. Avoid  foods with partially hydrogenated oils in them. These contain trans fats. Examples of foods that contain trans fats are stick margarine, some tub margarines, cookies, crackers, and  other baked goods. WHAT GENERAL GUIDELINES DO I NEED TO FOLLOW? Check food labels carefully to identify foods with trans fats or high amounts of saturated fat. Fill one half of your plate with vegetables and green salads. Eat 4-5 servings of vegetables per day. A serving of vegetables equals 1 cup of raw leafy vegetables,  cup of raw or cooked cut-up vegetables, or  cup of vegetable juice. Fill one fourth of your plate with whole grains. Look for the word "whole" as the first word in the ingredient list. Fill one fourth of your plate with lean protein foods. Eat 4-5 servings of fruit per day. A serving of fruit equals one medium whole fruit,  cup of dried fruit,  cup of fresh, frozen, or canned fruit, or  cup of 100% fruit juice. Eat more foods that contain soluble fiber. Examples of foods that contain this type of fiber are apples, broccoli, carrots, beans, peas, and barley. Aim to get 20-30 g of fiber per day. Eat more home-cooked food and less restaurant, buffet, and fast food. Limit or avoid alcohol. Limit foods that are high in starch and sugar. Avoid fried foods. Cook foods by using methods other than frying. Baking, boiling, grilling, and broiling are all great options. Other fat-reducing suggestions include: Removing the skin from poultry. Removing all visible fats from meats. Skimming the fat off of stews, soups, and gravies before serving them. Steaming vegetables in water or broth. Lose weight if you are overweight. Losing just 5-10% of your initial body weight can help your overall health and prevent diseases such as diabetes and heart disease. Increase your consumption of nuts, legumes, and seeds to 4-5 servings per week. One serving of dried beans or legumes equals  cup after being cooked, one serving  of nuts equals 1 ounces, and one serving of seeds equals  ounce or 1 tablespoon. You may need to monitor your salt (sodium) intake, especially if you have high blood pressure. Talk with your health care provider or dietitian to get more information about reducing sodium. WHAT FOODS CAN I EAT? Grains Breads, including [place], white, pita, wheat, raisin, rye, oatmeal, and [place]. Tortillas that are neither fried nor made with lard or trans fat. Low-fat rolls, including hotdog and hamburger buns and English muffins. Biscuits. Muffins. Waffles. Pancakes. Light popcorn. Whole-grain cereals. Flatbread. Melba toast. Pretzels. Breadsticks. Rusks. Low-fat snacks and crackers, including oyster, saltine, matzo, graham, animal, and rye. Rice and pasta, including brown rice and those that are made with whole wheat. Vegetables All vegetables. Fruits All fruits, but limit coconut. Meats and Other Protein Sources Lean, well-trimmed beef, veal, pork, and lamb. Chicken and [place] without skin. All fish and shellfish. Wild duck, rabbit, pheasant, and venison. Egg whites or low-cholesterol egg substitutes. Dried beans, peas, lentils, and tofu.Seeds and most nuts. Dairy Low-fat or nonfat cheeses, including ricotta, string, and mozzarella. Skim or 1% milk that is liquid, powdered, or evaporated. Buttermilk that is made with low-fat milk. Nonfat or low-fat yogurt. Beverages Mineral water. Diet carbonated beverages. Sweets and Desserts Sherbets and fruit ices. Honey, jam, marmalade, jelly, and syrups. Meringues and gelatins. Pure sugar candy, such as hard candy, jelly beans, gumdrops, mints, marshmallows, and small amounts of dark chocolate. [employer]. Eat all sweets and desserts in moderation. Fats and Oils Nonhydrogenated (trans-free) margarines. Vegetable oils, including soybean, sesame, sunflower, olive, peanut, safflower, corn, canola, and cottonseed. Salad dressings or mayonnaise that are made with a  vegetable oil. Limit added fats and oils that  you use for cooking, baking, salads, and as spreads. Other Cocoa powder. Coffee and tea. All seasonings and condiments. The items listed above may not be a complete list of recommended foods or beverages. Contact your dietitian for more options. WHAT FOODS ARE NOT RECOMMENDED? Grains Breads that are made with saturated or trans fats, oils, or whole milk. Croissants. Butter rolls. Cheese breads. Sweet rolls. Donuts. Buttered popcorn. Chow mein noodles. High-fat crackers, such as cheese or butter crackers. Meats and Other Protein Sources Fatty meats, such as hotdogs, short ribs, sausage, spareribs, bacon, ribeye roast or steak, and mutton. High-fat deli meats, such as salami and bologna. Caviar. Domestic duck and goose. Organ meats, such as kidney, liver, sweetbreads, brains, gizzard, chitterlings, and heart. Dairy Cream, sour cream, cream cheese, and creamed cottage cheese. Whole milk cheeses, including blue (bleu), Monte[place] Jack, [place], [place], American, Red [place], Swiss, [place], [place], and [place]. Whole or 2% milk that is liquid, evaporated, or condensed. Whole buttermilk. Cream sauce or high-fat cheese sauce. Yogurt that is made from whole milk. Beverages Regular sodas and drinks with added sugar. Sweets and Desserts Frosting. Pudding. Cookies. Cakes other than angel food cake. Candy that has milk chocolate or white chocolate, hydrogenated fat, butter, coconut, or unknown ingredients. Buttered syrups. Full-fat ice cream or ice cream drinks. Fats and Oils Gravy that has suet, meat fat, or shortening. Cocoa butter, hydrogenated oils, palm oil, coconut oil, palm kernel oil. These can often be found in baked products, candy, fried foods, nondairy creamers, and whipped toppings. Solid fats and shortenings, including bacon fat, salt pork, lard, and butter. Nondairy cream substitutes, such as coffee creamers and sour cream substitutes. Salad dressings that  are made of unknown oils, cheese, or sour cream. The items listed above may not be a complete list of foods and beverages to avoid. Contact your dietitian for more information.   This information is not intended to replace advice given to you by your health care provider. Make sure you discuss any questions you have with your health care provider.   Document Released: 10/14/2007 Document Revised: 01/25/2014 Document Reviewed: 06/28/2013 Elsevier Interactive Patient Education 2016 [place] Maintenance, Female Adopting a healthy lifestyle and getting preventive care can go a long way to promote health and wellness. Talk with your health care provider about what schedule of regular examinations is right for you. This is a good chance for you to check in with your provider about disease prevention and staying healthy. In between checkups, there are plenty of things you can do on your own. Experts have done a lot of research about which lifestyle changes and preventive measures are most likely to keep you healthy. Ask your health care provider for more information. WEIGHT AND DIET  Eat a healthy diet  Be sure to include plenty of vegetables, fruits, low-fat dairy products, and lean protein.  Do not eat a lot of foods high in solid fats, added sugars, or salt.  Get regular exercise. This is one of the most important things you can do for your health.  Most adults should exercise for at least 150 minutes each week. The exercise should increase your heart rate and make you sweat (moderate-intensity exercise).  Most adults should also do strengthening exercises at least twice a week. This is in addition to the moderate-intensity exercise.  Maintain a healthy weight  Body mass index (BMI) is a measurement that can be used to identify possible weight problems. It estimates body fat  based on height and weight. Your health care provider can help determine your BMI and help you  achieve or maintain a healthy weight.  For females 51 years of age and older:   A BMI below 18.5 is considered underweight.  A BMI of 18.5 to 24.9 is normal.  A BMI of 25 to 29.9 is considered overweight.  A BMI of 30 and above is considered obese.  Watch levels of cholesterol and blood lipids  You should start having your blood tested for lipids and cholesterol at 78 years of age, then have this test every 5 years.  You may need to have your cholesterol levels checked more often if:  Your lipid or cholesterol levels are high.  You are older than 78 years of age.  You are at high risk for heart disease.  CANCER SCREENING   Lung Cancer  Lung cancer screening is recommended for adults 44-59 years old who are at high risk for lung cancer because of a history of smoking.  A yearly low-dose CT scan of the lungs is recommended for people who:  Currently smoke.  Have quit within the past 15 years.  Have at least a 30-pack-year history of smoking. A pack year is smoking an average of one pack of cigarettes a day for 1 year.  Yearly screening should continue until it has been 15 years since you quit.  Yearly screening should stop if you develop a health problem that would prevent you from having lung cancer treatment.  Breast Cancer  Practice breast self-awareness. This means understanding how your breasts normally appear and feel.  It also means doing regular breast self-exams. Let your health care provider know about any changes, no matter how small.  If you are in your 20s or 30s, you should have a clinical breast exam (CBE) by a health care provider every 1-3 years as part of a regular health exam.  If you are 56 or older, have a CBE every year. Also consider having a breast X-ray (mammogram) every year.  If you have a family history of breast cancer, talk to your health care provider about genetic screening.  If you are at high risk for breast cancer, talk to your  health care provider about having an MRI and a mammogram every year.  Breast cancer gene (BRCA) assessment is recommended for women who have family members with BRCA-related cancers. BRCA-related cancers include:  Breast.  Ovarian.  Tubal.  Peritoneal cancers.  Results of the assessment will determine the need for genetic counseling and BRCA1 and BRCA2 testing. Cervical Cancer Your health care provider may recommend that you be screened regularly for cancer of the pelvic organs (ovaries, uterus, and vagina). This screening involves a pelvic examination, including checking for microscopic changes to the surface of your cervix (Pap test). You may be encouraged to have this screening done every 3 years, beginning at age 73.  For women ages 61-65, health care providers may recommend pelvic exams and Pap testing every 3 years, or they may recommend the Pap and pelvic exam, combined with testing for human papilloma virus (HPV), every 5 years. Some types of HPV increase your risk of cervical cancer. Testing for HPV may also be done on women of any age with unclear Pap test results.  Other health care providers may not recommend any screening for nonpregnant women who are considered low risk for pelvic cancer and who do not have symptoms. Ask your health care provider if a screening pelvic  exam is right for you.  If you have had past treatment for cervical cancer or a condition that could lead to cancer, you need Pap tests and screening for cancer for at least 20 years after your treatment. If Pap tests have been discontinued, your risk factors (such as having a new sexual partner) need to be reassessed to determine if screening should resume. Some women have medical problems that increase the chance of getting cervical cancer. In these cases, your health care provider may recommend more frequent screening and Pap tests. Colorectal Cancer  This type of cancer can be detected and often  prevented.  Routine colorectal cancer screening usually begins at 78 years of age and continues through 78 years of age.  Your health care provider may recommend screening at an earlier age if you have risk factors for colon cancer.  Your health care provider may also recommend using home test kits to check for hidden blood in the stool.  A small camera at the end of a tube can be used to examine your colon directly (sigmoidoscopy or colonoscopy). This is done to check for the earliest forms of colorectal cancer.  Routine screening usually begins at age 11.  Direct examination of the colon should be repeated every 5-10 years through 78 years of age. However, you may need to be screened more often if early forms of precancerous polyps or small growths are found. Skin Cancer  Check your skin from head to toe regularly.  Tell your health care provider about any new moles or changes in moles, especially if there is a change in a mole's shape or color.  Also tell your health care provider if you have a mole that is larger than the size of a pencil eraser.  Always use sunscreen. Apply sunscreen liberally and repeatedly throughout the day.  Protect yourself by wearing long sleeves, pants, a wide-brimmed hat, and sunglasses whenever you are outside. HEART DISEASE, DIABETES, AND HIGH BLOOD PRESSURE   High blood pressure causes heart disease and increases the risk of stroke. High blood pressure is more likely to develop in:  People who have blood pressure in the high end of the normal range (130-139/85-89 mm Hg).  People who are overweight or obese.  People who are African American.  If you are 27-79 years of age, have your blood pressure checked every 3-5 years. If you are 89 years of age or older, have your blood pressure checked every year. You should have your blood pressure measured twice--once when you are at a hospital or clinic, and once when you are not at a hospital or clinic.  Record the average of the two measurements. To check your blood pressure when you are not at a hospital or clinic, you can use:  An automated blood pressure machine at a pharmacy.  A home blood pressure monitor.  If you are between 66 years and 18 years old, ask your health care provider if you should take aspirin to prevent strokes.  Have regular diabetes screenings. This involves taking a blood sample to check your fasting blood sugar level.  If you are at a normal weight and have a low risk for diabetes, have this test once every three years after 78 years of age.  If you are overweight and have a high risk for diabetes, consider being tested at a younger age or more often. PREVENTING INFECTION  Hepatitis B  If you have a higher risk for hepatitis B, you should be  screened for this virus. You are considered at high risk for hepatitis B if:  You were born in a country where hepatitis B is common. Ask your health care provider which countries are considered high risk.  Your parents were born in a high-risk country, and you have not been immunized against hepatitis B (hepatitis B vaccine).  You have HIV or AIDS.  You use needles to inject street drugs.  You live with someone who has hepatitis B.  You have had sex with someone who has hepatitis B.  You get hemodialysis treatment.  You take certain medicines for conditions, including cancer, organ transplantation, and autoimmune conditions. Hepatitis C  Blood testing is recommended for:  Everyone born from [birth year] through 1963.  Anyone with known risk factors for hepatitis C. Sexually transmitted infections (STIs)  You should be screened for sexually transmitted infections (STIs) including gonorrhea and chlamydia if:  You are sexually active and are younger than 78 years of age.  You are older than 78 years of age and your health care provider tells you that you are at risk for this type of infection.  Your sexual activity  has changed since you were last screened and you are at an increased risk for chlamydia or gonorrhea. Ask your health care provider if you are at risk.  If you do not have HIV, but are at risk, it may be recommended that you take a prescription medicine daily to prevent HIV infection. This is called pre-exposure prophylaxis (PrEP). You are considered at risk if:  You are sexually active and do not regularly use condoms or know the HIV status of your partner(s).  You take drugs by injection.  You are sexually active with a partner who has HIV. Talk with your health care provider about whether you are at high risk of being infected with HIV. If you choose to begin PrEP, you should first be tested for HIV. You should then be tested every 3 months for as long as you are taking PrEP.  PREGNANCY   If you are premenopausal and you may become pregnant, ask your health care provider about preconception counseling.  If you may become pregnant, take 400 to 800 micrograms (mcg) of folic acid every day.  If you want to prevent pregnancy, talk to your health care provider about birth control (contraception). OSTEOPOROSIS AND MENOPAUSE   Osteoporosis is a disease in which the bones lose minerals and strength with aging. This can result in serious bone fractures. Your risk for osteoporosis can be identified using a bone density scan.  If you are 59 years of age or older, or if you are at risk for osteoporosis and fractures, ask your health care provider if you should be screened.  Ask your health care provider whether you should take a calcium or vitamin D supplement to lower your risk for osteoporosis.  Menopause may have certain physical symptoms and risks.  Hormone replacement therapy may reduce some of these symptoms and risks. Talk to your health care provider about whether hormone replacement therapy is right for you.  HOME CARE INSTRUCTIONS   Schedule regular health, dental, and eye  exams.  Stay current with your immunizations.   Do not use any tobacco products including cigarettes, chewing tobacco, or electronic cigarettes.  If you are pregnant, do not drink alcohol.  If you are breastfeeding, limit how much and how often you drink alcohol.  Limit alcohol intake to no more than 1 drink per day for nonpregnant  women. One drink equals 12 ounces of beer, 5 ounces of wine, or 1 ounces of hard liquor.  Do not use street drugs.  Do not share needles.  Ask your health care provider for help if you need support or information about quitting drugs.  Tell your health care provider if you often feel depressed.  Tell your health care provider if you have ever been abused or do not feel safe at home.   This information is not intended to replace advice given to you by your health care provider. Make sure you discuss any questions you have with your health care provider.   Document Released: 07/20/2010 Document Revised: 01/25/2014 Document Reviewed: 12/06/2012 Elsevier Interactive Patient Education 2016 [place] K. [name] M.D.

## 2015-04-15 NOTE — Patient Instructions (Addendum)
Work of on healthy weight loss   Will help sugar and joints and reflux .  Will notify you  of labs when available.  Mediterranean type diet is the healthiest   Heart-Healthy Eating Plan Many factors influence your heart health, including eating and exercise habits. Heart (coronary) risk increases with abnormal blood fat (lipid) levels. Heart-healthy meal planning includes limiting unhealthy fats, increasing healthy fats, and making other small dietary changes. This includes maintaining a healthy body weight to help keep lipid levels within a normal range. WHAT IS MY PLAN?  Your health care provider recommends that you: Get no more than _________% of the total calories in your daily diet from fat. Limit your intake of saturated fat to less than _________% of your total calories each day. Limit the amount of cholesterol in your diet to less than _________ mg per day. WHAT TYPES OF FAT SHOULD I CHOOSE? Choose healthy fats more often. Choose monounsaturated and polyunsaturated fats, such as olive oil and canola oil, flaxseeds, walnuts, almonds, and seeds. Eat more omega-3 fats. Good choices include salmon, mackerel, sardines, tuna, flaxseed oil, and ground flaxseeds. Aim to eat fish at least two times each week. Limit saturated fats. Saturated fats are primarily found in animal products, such as meats, butter, and cream. Plant sources of saturated fats include palm oil, palm kernel oil, and coconut oil. Avoid foods with partially hydrogenated oils in them. These contain trans fats. Examples of foods that contain trans fats are stick margarine, some tub margarines, cookies, crackers, and other baked goods. WHAT GENERAL GUIDELINES DO I NEED TO FOLLOW? Check food labels carefully to identify foods with trans fats or high amounts of saturated fat. Fill one half of your plate with vegetables and green salads. Eat 4-5 servings of vegetables per day. A serving of vegetables equals 1 cup of raw leafy  vegetables,  cup of raw or cooked cut-up vegetables, or  cup of vegetable juice. Fill one fourth of your plate with whole grains. Look for the word "whole" as the first word in the ingredient list. Fill one fourth of your plate with lean protein foods. Eat 4-5 servings of fruit per day. A serving of fruit equals one medium whole fruit,  cup of dried fruit,  cup of fresh, frozen, or canned fruit, or  cup of 100% fruit juice. Eat more foods that contain soluble fiber. Examples of foods that contain this type of fiber are apples, broccoli, carrots, beans, peas, and barley. Aim to get 20-30 g of fiber per day. Eat more home-cooked food and less restaurant, buffet, and fast food. Limit or avoid alcohol. Limit foods that are high in starch and sugar. Avoid fried foods. Cook foods by using methods other than frying. Baking, boiling, grilling, and broiling are all great options. Other fat-reducing suggestions include: Removing the skin from poultry. Removing all visible fats from meats. Skimming the fat off of stews, soups, and gravies before serving them. Steaming vegetables in water or broth. Lose weight if you are overweight. Losing just 5-10% of your initial body weight can help your overall health and prevent diseases such as diabetes and heart disease. Increase your consumption of nuts, legumes, and seeds to 4-5 servings per week. One serving of dried beans or legumes equals  cup after being cooked, one serving of nuts equals 1 ounces, and one serving of seeds equals  ounce or 1 tablespoon. You may need to monitor your salt (sodium) intake, especially if you have high blood pressure. Talk  with your health care provider or dietitian to get more information about reducing sodium. WHAT FOODS CAN I EAT? Grains Breads, including [place], white, pita, wheat, raisin, rye, oatmeal, and [place]. Tortillas that are neither fried nor made with lard or trans fat. Low-fat rolls, including hotdog and  hamburger buns and English muffins. Biscuits. Muffins. Waffles. Pancakes. Light popcorn. Whole-grain cereals. Flatbread. Melba toast. Pretzels. Breadsticks. Rusks. Low-fat snacks and crackers, including oyster, saltine, matzo, graham, animal, and rye. Rice and pasta, including brown rice and those that are made with whole wheat. Vegetables All vegetables. Fruits All fruits, but limit coconut. Meats and Other Protein Sources Lean, well-trimmed beef, veal, pork, and lamb. Chicken and [place] without skin. All fish and shellfish. Wild duck, rabbit, pheasant, and venison. Egg whites or low-cholesterol egg substitutes. Dried beans, peas, lentils, and tofu.Seeds and most nuts. Dairy Low-fat or nonfat cheeses, including ricotta, string, and mozzarella. Skim or 1% milk that is liquid, powdered, or evaporated. Buttermilk that is made with low-fat milk. Nonfat or low-fat yogurt. Beverages Mineral water. Diet carbonated beverages. Sweets and Desserts Sherbets and fruit ices. Honey, jam, marmalade, jelly, and syrups. Meringues and gelatins. Pure sugar candy, such as hard candy, jelly beans, gumdrops, mints, marshmallows, and small amounts of dark chocolate. [employer] Eat all sweets and desserts in moderation. Fats and Oils Nonhydrogenated (trans-free) margarines. Vegetable oils, including soybean, sesame, sunflower, olive, peanut, safflower, corn, canola, and cottonseed. Salad dressings or mayonnaise that are made with a vegetable oil. Limit added fats and oils that you use for cooking, baking, salads, and as spreads. Other Cocoa powder. Coffee and tea. All seasonings and condiments. The items listed above may not be a complete list of recommended foods or beverages. Contact your dietitian for more options. WHAT FOODS ARE NOT RECOMMENDED? Grains Breads that are made with saturated or trans fats, oils, or whole milk. Croissants. Butter rolls. Cheese breads. Sweet rolls. Donuts. Buttered popcorn. Chow  mein noodles. High-fat crackers, such as cheese or butter crackers. Meats and Other Protein Sources Fatty meats, such as hotdogs, short ribs, sausage, spareribs, bacon, ribeye roast or steak, and mutton. High-fat deli meats, such as salami and bologna. Caviar. Domestic duck and goose. Organ meats, such as kidney, liver, sweetbreads, brains, gizzard, chitterlings, and heart. Dairy Cream, sour cream, cream cheese, and creamed cottage cheese. Whole milk cheeses, including blue (bleu), Monte[place] Jack, [place], [place], American, [place], [place], [place], [place], and [place]. Whole or 2% milk that is liquid, evaporated, or condensed. Whole buttermilk. Cream sauce or high-fat cheese sauce. Yogurt that is made from whole milk. Beverages Regular sodas and drinks with added sugar. Sweets and Desserts Frosting. Pudding. Cookies. Cakes other than angel food cake. Candy that has milk chocolate or white chocolate, hydrogenated fat, butter, coconut, or unknown ingredients. Buttered syrups. Full-fat ice cream or ice cream drinks. Fats and Oils Gravy that has suet, meat fat, or shortening. Cocoa butter, hydrogenated oils, palm oil, coconut oil, palm kernel oil. These can often be found in baked products, candy, fried foods, nondairy creamers, and whipped toppings. Solid fats and shortenings, including bacon fat, salt pork, lard, and butter. Nondairy cream substitutes, such as coffee creamers and sour cream substitutes. Salad dressings that are made of unknown oils, cheese, or sour cream. The items listed above may not be a complete list of foods and beverages to avoid. Contact your dietitian for more information.   This information is not intended to replace advice given to you by your health care provider. Make sure  you discuss any questions you have with your health care provider.   Document Released: 10/14/2007 Document Revised: 01/25/2014 Document Reviewed: 06/28/2013 Elsevier Interactive Patient Education 2016  [place] Maintenance, Female Adopting a healthy lifestyle and getting preventive care can go a long way to promote health and wellness. Talk with your health care provider about what schedule of regular examinations is right for you. This is a good chance for you to check in with your provider about disease prevention and staying healthy. In between checkups, there are plenty of things you can do on your own. Experts have done a lot of research about which lifestyle changes and preventive measures are most likely to keep you healthy. Ask your health care provider for more information. WEIGHT AND DIET  Eat a healthy diet  Be sure to include plenty of vegetables, fruits, low-fat dairy products, and lean protein.  Do not eat a lot of foods high in solid fats, added sugars, or salt.  Get regular exercise. This is one of the most important things you can do for your health.  Most adults should exercise for at least 150 minutes each week. The exercise should increase your heart rate and make you sweat (moderate-intensity exercise).  Most adults should also do strengthening exercises at least twice a week. This is in addition to the moderate-intensity exercise.  Maintain a healthy weight  Body mass index (BMI) is a measurement that can be used to identify possible weight problems. It estimates body fat based on height and weight. Your health care provider can help determine your BMI and help you achieve or maintain a healthy weight.  For females 44 years of age and older:   A BMI below 18.5 is considered underweight.  A BMI of 18.5 to 24.9 is normal.  A BMI of 25 to 29.9 is considered overweight.  A BMI of 30 and above is considered obese.  Watch levels of cholesterol and blood lipids  You should start having your blood tested for lipids and cholesterol at 77 years of age, then have this test every 5 years.  You may need to have your cholesterol levels checked more  often if:  Your lipid or cholesterol levels are high.  You are older than 78 years of age.  You are at high risk for heart disease.  CANCER SCREENING   Lung Cancer  Lung cancer screening is recommended for adults 7-62 years old who are at high risk for lung cancer because of a history of smoking.  A yearly low-dose CT scan of the lungs is recommended for people who:  Currently smoke.  Have quit within the past 15 years.  Have at least a 30-pack-year history of smoking. A pack year is smoking an average of one pack of cigarettes a day for 1 year.  Yearly screening should continue until it has been 15 years since you quit.  Yearly screening should stop if you develop a health problem that would prevent you from having lung cancer treatment.  Breast Cancer  Practice breast self-awareness. This means understanding how your breasts normally appear and feel.  It also means doing regular breast self-exams. Let your health care provider know about any changes, no matter how small.  If you are in your 20s or 30s, you should have a clinical breast exam (CBE) by a health care provider every 1-3 years as part of a regular health exam.  If you are 40 or older, have  a CBE every year. Also consider having a breast X-ray (mammogram) every year.  If you have a family history of breast cancer, talk to your health care provider about genetic screening.  If you are at high risk for breast cancer, talk to your health care provider about having an MRI and a mammogram every year.  Breast cancer gene (BRCA) assessment is recommended for women who have family members with BRCA-related cancers. BRCA-related cancers include:  Breast.  Ovarian.  Tubal.  Peritoneal cancers.  Results of the assessment will determine the need for genetic counseling and BRCA1 and BRCA2 testing. Cervical Cancer Your health care provider may recommend that you be screened regularly for cancer of the pelvic organs  (ovaries, uterus, and vagina). This screening involves a pelvic examination, including checking for microscopic changes to the surface of your cervix (Pap test). You may be encouraged to have this screening done every 3 years, beginning at age 28.  For women ages 1-65, health care providers may recommend pelvic exams and Pap testing every 3 years, or they may recommend the Pap and pelvic exam, combined with testing for human papilloma virus (HPV), every 5 years. Some types of HPV increase your risk of cervical cancer. Testing for HPV may also be done on women of any age with unclear Pap test results.  Other health care providers may not recommend any screening for nonpregnant women who are considered low risk for pelvic cancer and who do not have symptoms. Ask your health care provider if a screening pelvic exam is right for you.  If you have had past treatment for cervical cancer or a condition that could lead to cancer, you need Pap tests and screening for cancer for at least 20 years after your treatment. If Pap tests have been discontinued, your risk factors (such as having a new sexual partner) need to be reassessed to determine if screening should resume. Some women have medical problems that increase the chance of getting cervical cancer. In these cases, your health care provider may recommend more frequent screening and Pap tests. Colorectal Cancer  This type of cancer can be detected and often prevented.  Routine colorectal cancer screening usually begins at 78 years of age and continues through 78 years of age.  Your health care provider may recommend screening at an earlier age if you have risk factors for colon cancer.  Your health care provider may also recommend using home test kits to check for hidden blood in the stool.  A small camera at the end of a tube can be used to examine your colon directly (sigmoidoscopy or colonoscopy). This is done to check for the earliest forms of  colorectal cancer.  Routine screening usually begins at age 69.  Direct examination of the colon should be repeated every 5-10 years through 78 years of age. However, you may need to be screened more often if early forms of precancerous polyps or small growths are found. Skin Cancer  Check your skin from head to toe regularly.  Tell your health care provider about any new moles or changes in moles, especially if there is a change in a mole's shape or color.  Also tell your health care provider if you have a mole that is larger than the size of a pencil eraser.  Always use sunscreen. Apply sunscreen liberally and repeatedly throughout the day.  Protect yourself by wearing long sleeves, pants, a wide-brimmed hat, and sunglasses whenever you are outside. HEART DISEASE, DIABETES, AND HIGH  BLOOD PRESSURE   High blood pressure causes heart disease and increases the risk of stroke. High blood pressure is more likely to develop in:  People who have blood pressure in the high end of the normal range (130-139/85-89 mm Hg).  People who are overweight or obese.  People who are African American.  If you are 42-71 years of age, have your blood pressure checked every 3-5 years. If you are 38 years of age or older, have your blood pressure checked every year. You should have your blood pressure measured twice--once when you are at a hospital or clinic, and once when you are not at a hospital or clinic. Record the average of the two measurements. To check your blood pressure when you are not at a hospital or clinic, you can use:  An automated blood pressure machine at a pharmacy.  A home blood pressure monitor.  If you are between 79 years and 67 years old, ask your health care provider if you should take aspirin to prevent strokes.  Have regular diabetes screenings. This involves taking a blood sample to check your fasting blood sugar level.  If you are at a normal weight and have a low risk for  diabetes, have this test once every three years after 78 years of age.  If you are overweight and have a high risk for diabetes, consider being tested at a younger age or more often. PREVENTING INFECTION  Hepatitis B  If you have a higher risk for hepatitis B, you should be screened for this virus. You are considered at high risk for hepatitis B if:  You were born in a country where hepatitis B is common. Ask your health care provider which countries are considered high risk.  Your parents were born in a high-risk country, and you have not been immunized against hepatitis B (hepatitis B vaccine).  You have HIV or AIDS.  You use needles to inject street drugs.  You live with someone who has hepatitis B.  You have had sex with someone who has hepatitis B.  You get hemodialysis treatment.  You take certain medicines for conditions, including cancer, organ transplantation, and autoimmune conditions. Hepatitis C  Blood testing is recommended for:  Everyone born from [birth year] through 1963.  Anyone with known risk factors for hepatitis C. Sexually transmitted infections (STIs)  You should be screened for sexually transmitted infections (STIs) including gonorrhea and chlamydia if:  You are sexually active and are younger than 78 years of age.  You are older than 77 years of age and your health care provider tells you that you are at risk for this type of infection.  Your sexual activity has changed since you were last screened and you are at an increased risk for chlamydia or gonorrhea. Ask your health care provider if you are at risk.  If you do not have HIV, but are at risk, it may be recommended that you take a prescription medicine daily to prevent HIV infection. This is called pre-exposure prophylaxis (PrEP). You are considered at risk if:  You are sexually active and do not regularly use condoms or know the HIV status of your partner(s).  You take drugs by injection.  You are  sexually active with a partner who has HIV. Talk with your health care provider about whether you are at high risk of being infected with HIV. If you choose to begin PrEP, you should first be tested for HIV. You should then be tested every  3 months for as long as you are taking PrEP.  PREGNANCY   If you are premenopausal and you may become pregnant, ask your health care provider about preconception counseling.  If you may become pregnant, take 400 to 800 micrograms (mcg) of folic acid every day.  If you want to prevent pregnancy, talk to your health care provider about birth control (contraception). OSTEOPOROSIS AND MENOPAUSE   Osteoporosis is a disease in which the bones lose minerals and strength with aging. This can result in serious bone fractures. Your risk for osteoporosis can be identified using a bone density scan.  If you are 67 years of age or older, or if you are at risk for osteoporosis and fractures, ask your health care provider if you should be screened.  Ask your health care provider whether you should take a calcium or vitamin D supplement to lower your risk for osteoporosis.  Menopause may have certain physical symptoms and risks.  Hormone replacement therapy may reduce some of these symptoms and risks. Talk to your health care provider about whether hormone replacement therapy is right for you.  HOME CARE INSTRUCTIONS   Schedule regular health, dental, and eye exams.  Stay current with your immunizations.   Do not use any tobacco products including cigarettes, chewing tobacco, or electronic cigarettes.  If you are pregnant, do not drink alcohol.  If you are breastfeeding, limit how much and how often you drink alcohol.  Limit alcohol intake to no more than 1 drink per day for nonpregnant women. One drink equals 12 ounces of beer, 5 ounces of wine, or 1 ounces of hard liquor.  Do not use street drugs.  Do not share needles.  Ask your health care provider for  help if you need support or information about quitting drugs.  Tell your health care provider if you often feel depressed.  Tell your health care provider if you have ever been abused or do not feel safe at home.   This information is not intended to replace advice given to you by your health care provider. Make sure you discuss any questions you have with your health care provider.   Document Released: 07/20/2010 Document Revised: 01/25/2014 Document Reviewed: 12/06/2012 Elsevier Interactive Patient Education [employer].

## 2015-04-16 ENCOUNTER — Ambulatory Visit (INDEPENDENT_AMBULATORY_CARE_PROVIDER_SITE_OTHER): Payer: Medicare Other | Admitting: Internal Medicine

## 2015-04-16 ENCOUNTER — Encounter: Payer: Self-pay | Admitting: Internal Medicine

## 2015-04-16 VITALS — BP 126/80 | Temp 98.0°F | Ht 62.0 in | Wt 239.0 lb

## 2015-04-16 DIAGNOSIS — I350 Nonrheumatic aortic (valve) stenosis: Secondary | ICD-10-CM

## 2015-04-16 DIAGNOSIS — I10 Essential (primary) hypertension: Secondary | ICD-10-CM

## 2015-04-16 DIAGNOSIS — E785 Hyperlipidemia, unspecified: Secondary | ICD-10-CM

## 2015-04-16 DIAGNOSIS — Z23 Encounter for immunization: Secondary | ICD-10-CM | POA: Diagnosis not present

## 2015-04-16 DIAGNOSIS — Z0000 Encounter for general adult medical examination without abnormal findings: Secondary | ICD-10-CM

## 2015-04-16 DIAGNOSIS — R7301 Impaired fasting glucose: Secondary | ICD-10-CM | POA: Diagnosis not present

## 2015-04-16 LAB — CBC WITH DIFFERENTIAL/PLATELET
Basophils Absolute: 0 10*3/uL (ref 0.0–0.1)
Basophils Relative: 0.3 % (ref 0.0–3.0)
EOS PCT: 1.6 % (ref 0.0–5.0)
Eosinophils Absolute: 0.1 10*3/uL (ref 0.0–0.7)
HEMATOCRIT: 39.5 % (ref 36.0–46.0)
HEMOGLOBIN: 13.4 g/dL (ref 12.0–15.0)
LYMPHS PCT: 34.3 % (ref 12.0–46.0)
Lymphs Abs: 2.4 10*3/uL (ref 0.7–4.0)
MCHC: 34 g/dL (ref 30.0–36.0)
MCV: 95.7 fl (ref 78.0–100.0)
MONO ABS: 0.6 10*3/uL (ref 0.1–1.0)
Monocytes Relative: 9 % (ref 3.0–12.0)
NEUTROS ABS: 3.9 10*3/uL (ref 1.4–7.7)
Neutrophils Relative %: 54.8 % (ref 43.0–77.0)
Platelets: 274 10*3/uL (ref 150.0–400.0)
RBC: 4.13 Mil/uL (ref 3.87–5.11)
RDW: 13.7 % (ref 11.5–15.5)
WBC: 7.1 10*3/uL (ref 4.0–10.5)

## 2015-04-16 LAB — LIPID PANEL
CHOLESTEROL: 205 mg/dL — AB (ref 0–200)
HDL: 60.9 mg/dL (ref >39.00)
LDL Cholesterol: 115 mg/dL — ABNORMAL HIGH (ref 0–99)
NONHDL: 144.43
TRIGLYCERIDES: 147 mg/dL (ref 0.0–149.0)
Total CHOL/HDL Ratio: 3
VLDL: 29.4 mg/dL (ref 0.0–40.0)

## 2015-04-16 LAB — BASIC METABOLIC PANEL
BUN: 14 mg/dL (ref 6–23)
CHLORIDE: 101 meq/L (ref 96–112)
CO2: 34 meq/L — AB (ref 19–32)
Calcium: 10.1 mg/dL (ref 8.4–10.5)
Creatinine, Ser: 0.76 mg/dL (ref 0.40–1.20)
GFR: 78.3 mL/min (ref >60.00)
Glucose, Bld: 97 mg/dL (ref 70–99)
POTASSIUM: 4 meq/L (ref 3.5–5.1)
Sodium: 141 mEq/L (ref 135–145)

## 2015-04-16 LAB — TSH: TSH: 1.05 u[IU]/mL (ref 0.35–4.50)

## 2015-04-16 LAB — HEPATIC FUNCTION PANEL
ALT: 22 U/L (ref 0–35)
AST: 19 U/L (ref 0–37)
Albumin: 4.2 g/dL (ref 3.5–5.2)
Alkaline Phosphatase: 61 U/L (ref 39–117)
Bilirubin, Direct: 0.1 mg/dL (ref 0.0–0.3)
TOTAL PROTEIN: 6.6 g/dL (ref 6.0–8.3)
Total Bilirubin: 0.6 mg/dL (ref 0.2–1.2)

## 2015-04-16 LAB — HEMOGLOBIN A1C: HEMOGLOBIN A1C: 6.3 % (ref 4.6–6.5)

## 2015-04-16 MED ORDER — ROSUVASTATIN CALCIUM 10 MG PO TABS: 10.0000 mg | ORAL_TABLET | Freq: Every day | ORAL | Status: DC

## 2015-04-16 NOTE — Progress Notes (Signed)
Pre visit review using our clinic review tool, if applicable. No additional management support is needed unless otherwise documented below in the visit note. 

## 2015-04-18 DIAGNOSIS — H52223 Regular astigmatism, bilateral: Secondary | ICD-10-CM | POA: Diagnosis not present

## 2015-04-18 DIAGNOSIS — H524 Presbyopia: Secondary | ICD-10-CM | POA: Diagnosis not present

## 2015-04-18 DIAGNOSIS — H04123 Dry eye syndrome of bilateral lacrimal glands: Secondary | ICD-10-CM | POA: Diagnosis not present

## 2015-04-18 DIAGNOSIS — H5203 Hypermetropia, bilateral: Secondary | ICD-10-CM | POA: Diagnosis not present

## 2015-04-22 ENCOUNTER — Other Ambulatory Visit: Payer: Self-pay | Admitting: Family Medicine

## 2015-04-22 ENCOUNTER — Telehealth: Payer: Self-pay | Admitting: *Deleted

## 2015-04-22 MED ORDER — ROSUVASTATIN CALCIUM 10 MG PO TABS: 10.0000 mg | ORAL_TABLET | Freq: Every day | ORAL | Status: DC

## 2015-04-22 NOTE — Telephone Encounter (Signed)
Unable to reach pt or leave a message  

## 2015-04-22 NOTE — Telephone Encounter (Signed)
Sent to the pharmacy by e-scribe. 

## 2015-04-22 NOTE — Telephone Encounter (Signed)
-----   Message from Lelon Perla, MD sent at 04/19/2015 12:07 PM EDT ----- Change crestor to 40 mg daily; lipids and liver 4 weeks Kirk [facility]s  ----- Message -----    From: Burnis Medin, MD    Sent: 04/19/2015  12:05 PM      To: Hulda Humphrey, CMA, Lelon Perla, MD  Labs ok  High normal sugars in prediabetic range stable  Cholesterol not a good as last year  Will send results to cardiology  Healthy  weigh t loss will help  Let us know if you want [place] to do nutrition referral

## 2015-04-23 ENCOUNTER — Other Ambulatory Visit: Payer: Self-pay | Admitting: *Deleted

## 2015-04-23 DIAGNOSIS — E785 Hyperlipidemia, unspecified: Secondary | ICD-10-CM

## 2015-04-23 DIAGNOSIS — Z79899 Other long term (current) drug therapy: Secondary | ICD-10-CM

## 2015-04-23 MED ORDER — ROSUVASTATIN CALCIUM 40 MG PO TABS: 40.0000 mg | ORAL_TABLET | Freq: Every day | ORAL | Status: DC

## 2015-04-28 ENCOUNTER — Ambulatory Visit (HOSPITAL_COMMUNITY): Payer: Medicare Other | Attending: Cardiovascular Disease

## 2015-04-28 ENCOUNTER — Other Ambulatory Visit: Payer: Self-pay

## 2015-04-28 DIAGNOSIS — I359 Nonrheumatic aortic valve disorder, unspecified: Secondary | ICD-10-CM | POA: Diagnosis not present

## 2015-04-28 DIAGNOSIS — E785 Hyperlipidemia, unspecified: Secondary | ICD-10-CM | POA: Insufficient documentation

## 2015-04-28 DIAGNOSIS — I2510 Atherosclerotic heart disease of native coronary artery without angina pectoris: Secondary | ICD-10-CM | POA: Diagnosis not present

## 2015-04-28 DIAGNOSIS — Z6841 Body Mass Index (BMI) 40.0 and over, adult: Secondary | ICD-10-CM | POA: Insufficient documentation

## 2015-04-28 DIAGNOSIS — I10 Essential (primary) hypertension: Secondary | ICD-10-CM | POA: Diagnosis not present

## 2015-06-07 ENCOUNTER — Other Ambulatory Visit: Payer: Self-pay | Admitting: Cardiology

## 2015-06-09 NOTE — Telephone Encounter (Signed)
Rx(s) sent to pharmacy electronically.  

## 2015-06-11 DIAGNOSIS — E785 Hyperlipidemia, unspecified: Secondary | ICD-10-CM | POA: Diagnosis not present

## 2015-06-11 DIAGNOSIS — Z79899 Other long term (current) drug therapy: Secondary | ICD-10-CM | POA: Diagnosis not present

## 2015-06-12 LAB — HEPATIC FUNCTION PANEL
ALK PHOS: 59 U/L (ref 33–130)
ALT: 22 U/L (ref 6–29)
AST: 20 U/L (ref 10–35)
Albumin: 4.2 g/dL (ref 3.6–5.1)
BILIRUBIN DIRECT: 0.1 mg/dL (ref <=0.2)
BILIRUBIN INDIRECT: 0.4 mg/dL (ref 0.2–1.2)
BILIRUBIN TOTAL: 0.5 mg/dL (ref 0.2–1.2)
TOTAL PROTEIN: 6.5 g/dL (ref 6.1–8.1)

## 2015-06-12 LAB — LIPID PANEL
CHOLESTEROL: 182 mg/dL (ref 125–200)
HDL: 62 mg/dL (ref >=46)
LDL CALC: 77 mg/dL (ref <130)
TRIGLYCERIDES: 216 mg/dL — AB (ref <150)
Total CHOL/HDL Ratio: 2.9 Ratio (ref <=5.0)
VLDL: 43 mg/dL — AB (ref <30)

## 2015-06-20 DIAGNOSIS — H16149 Punctate keratitis, unspecified eye: Secondary | ICD-10-CM | POA: Diagnosis not present

## 2015-06-23 DIAGNOSIS — H18831 Recurrent erosion of cornea, right eye: Secondary | ICD-10-CM | POA: Diagnosis not present

## 2015-07-07 DIAGNOSIS — H16141 Punctate keratitis, right eye: Secondary | ICD-10-CM | POA: Diagnosis not present

## 2015-09-16 DIAGNOSIS — Z1231 Encounter for screening mammogram for malignant neoplasm of breast: Secondary | ICD-10-CM | POA: Diagnosis not present

## 2015-09-16 DIAGNOSIS — Z803 Family history of malignant neoplasm of breast: Secondary | ICD-10-CM | POA: Diagnosis not present

## 2015-09-16 LAB — HM MAMMOGRAPHY

## 2015-09-24 ENCOUNTER — Encounter: Payer: Self-pay | Admitting: Family Medicine

## 2015-10-01 DIAGNOSIS — Z23 Encounter for immunization: Secondary | ICD-10-CM | POA: Diagnosis not present

## 2015-11-07 DIAGNOSIS — H16103 Unspecified superficial keratitis, bilateral: Secondary | ICD-10-CM | POA: Diagnosis not present

## 2015-11-07 DIAGNOSIS — H11129 Conjunctival concretions, unspecified eye: Secondary | ICD-10-CM | POA: Diagnosis not present

## 2015-11-07 DIAGNOSIS — H04123 Dry eye syndrome of bilateral lacrimal glands: Secondary | ICD-10-CM | POA: Diagnosis not present

## 2015-11-07 DIAGNOSIS — Z961 Presence of intraocular lens: Secondary | ICD-10-CM | POA: Diagnosis not present

## 2015-12-04 DIAGNOSIS — S62346A Nondisplaced fracture of base of fifth metacarpal bone, right hand, initial encounter for closed fracture: Secondary | ICD-10-CM | POA: Diagnosis not present

## 2015-12-04 DIAGNOSIS — M47816 Spondylosis without myelopathy or radiculopathy, lumbar region: Secondary | ICD-10-CM | POA: Diagnosis not present

## 2015-12-30 ENCOUNTER — Other Ambulatory Visit: Payer: Self-pay | Admitting: Orthopedic Surgery

## 2015-12-30 DIAGNOSIS — S92515A Nondisplaced fracture of proximal phalanx of left lesser toe(s), initial encounter for closed fracture: Secondary | ICD-10-CM | POA: Diagnosis not present

## 2015-12-30 DIAGNOSIS — S62346D Nondisplaced fracture of base of fifth metacarpal bone, right hand, subsequent encounter for fracture with routine healing: Secondary | ICD-10-CM | POA: Diagnosis not present

## 2015-12-30 DIAGNOSIS — M545 Low back pain: Secondary | ICD-10-CM

## 2016-01-02 ENCOUNTER — Ambulatory Visit
Admission: RE | Admit: 2016-01-02 | Discharge: 2016-01-02 | Disposition: A | Discharge status: Home / Self Care | Payer: Medicare Other | Source: Ambulatory Visit | Attending: Orthopedic Surgery | Admitting: Orthopedic Surgery

## 2016-01-02 DIAGNOSIS — M5126 Other intervertebral disc displacement, lumbar region: Secondary | ICD-10-CM | POA: Diagnosis not present

## 2016-01-02 DIAGNOSIS — M545 Low back pain: Secondary | ICD-10-CM

## 2016-01-08 DIAGNOSIS — H16103 Unspecified superficial keratitis, bilateral: Secondary | ICD-10-CM | POA: Diagnosis not present

## 2016-01-08 DIAGNOSIS — Z961 Presence of intraocular lens: Secondary | ICD-10-CM | POA: Diagnosis not present

## 2016-01-08 DIAGNOSIS — H11129 Conjunctival concretions, unspecified eye: Secondary | ICD-10-CM | POA: Diagnosis not present

## 2016-01-08 DIAGNOSIS — H04123 Dry eye syndrome of bilateral lacrimal glands: Secondary | ICD-10-CM | POA: Diagnosis not present

## 2016-02-03 DIAGNOSIS — H9122 Sudden idiopathic hearing loss, left ear: Secondary | ICD-10-CM | POA: Diagnosis not present

## 2016-02-03 DIAGNOSIS — H93291 Other abnormal auditory perceptions, right ear: Secondary | ICD-10-CM | POA: Diagnosis not present

## 2016-02-03 DIAGNOSIS — H903 Sensorineural hearing loss, bilateral: Secondary | ICD-10-CM | POA: Diagnosis not present

## 2016-02-03 DIAGNOSIS — M26629 Arthralgia of temporomandibular joint, unspecified side: Secondary | ICD-10-CM | POA: Diagnosis not present

## 2016-02-06 ENCOUNTER — Other Ambulatory Visit: Payer: Self-pay | Admitting: Otolaryngology

## 2016-02-06 DIAGNOSIS — H9122 Sudden idiopathic hearing loss, left ear: Secondary | ICD-10-CM

## 2016-02-16 ENCOUNTER — Ambulatory Visit
Admission: RE | Admit: 2016-02-16 | Discharge: 2016-02-16 | Disposition: A | Discharge status: Home / Self Care | Payer: Medicare Other | Source: Ambulatory Visit | Attending: Otolaryngology | Admitting: Otolaryngology

## 2016-02-16 DIAGNOSIS — H9122 Sudden idiopathic hearing loss, left ear: Secondary | ICD-10-CM

## 2016-02-16 DIAGNOSIS — H9192 Unspecified hearing loss, left ear: Secondary | ICD-10-CM | POA: Diagnosis not present

## 2016-02-16 MED ORDER — GADOBENATE DIMEGLUMINE 529 MG/ML IV SOLN
20.0000 mL | Freq: Once | INTRAVENOUS | Status: AC | PRN
  Administered 2016-02-16: 20 mL via INTRAVENOUS

## 2016-04-02 DIAGNOSIS — Z85828 Personal history of other malignant neoplasm of skin: Secondary | ICD-10-CM | POA: Diagnosis not present

## 2016-04-02 DIAGNOSIS — L570 Actinic keratosis: Secondary | ICD-10-CM | POA: Diagnosis not present

## 2016-04-02 DIAGNOSIS — L986 Other infiltrative disorders of the skin and subcutaneous tissue: Secondary | ICD-10-CM | POA: Diagnosis not present

## 2016-04-02 DIAGNOSIS — D485 Neoplasm of uncertain behavior of skin: Secondary | ICD-10-CM | POA: Diagnosis not present

## 2016-04-02 DIAGNOSIS — D1801 Hemangioma of skin and subcutaneous tissue: Secondary | ICD-10-CM | POA: Diagnosis not present

## 2016-04-02 DIAGNOSIS — L821 Other seborrheic keratosis: Secondary | ICD-10-CM | POA: Diagnosis not present

## 2016-04-06 ENCOUNTER — Other Ambulatory Visit: Payer: Self-pay | Admitting: Cardiology

## 2016-04-06 NOTE — Telephone Encounter (Signed)
Rx(s) sent to pharmacy electronically.  

## 2016-04-16 ENCOUNTER — Encounter: Payer: Medicare Other | Admitting: Internal Medicine

## 2016-04-27 ENCOUNTER — Ambulatory Visit: Payer: Medicare Other | Admitting: Cardiology

## 2016-04-28 ENCOUNTER — Ambulatory Visit (INDEPENDENT_AMBULATORY_CARE_PROVIDER_SITE_OTHER): Payer: Medicare Other | Admitting: Physician Assistant

## 2016-04-28 ENCOUNTER — Ambulatory Visit: Payer: Medicare Other | Admitting: Physician Assistant

## 2016-04-28 ENCOUNTER — Encounter: Payer: Self-pay | Admitting: Physician Assistant

## 2016-04-28 VITALS — BP 137/88 | HR 71 | Ht 62.5 in | Wt 252.4 lb

## 2016-04-28 DIAGNOSIS — I10 Essential (primary) hypertension: Secondary | ICD-10-CM

## 2016-04-28 DIAGNOSIS — I350 Nonrheumatic aortic (valve) stenosis: Secondary | ICD-10-CM

## 2016-04-28 DIAGNOSIS — E785 Hyperlipidemia, unspecified: Secondary | ICD-10-CM

## 2016-04-28 DIAGNOSIS — I2510 Atherosclerotic heart disease of native coronary artery without angina pectoris: Secondary | ICD-10-CM

## 2016-04-28 NOTE — Patient Instructions (Addendum)
Your physician has requested that you have an echocardiogram @ [address] - [address]rd [address]. Echocardiography is a painless test that uses sound waves to create images of your heart. It provides your doctor with information about the size and shape of your heart and how well your heart's chambers and valves are working. This procedure takes approximately one hour. There are no restrictions for this procedure.  Your physician wants you to follow-up in: ONE YEAR with [name]. You will receive a reminder letter in the mail two months in advance. If you don't receive a letter, please call our office to schedule the follow-up appointment.
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
## 2016-05-17 ENCOUNTER — Ambulatory Visit (HOSPITAL_COMMUNITY): Payer: Medicare Other | Attending: Cardiology

## 2016-05-17 ENCOUNTER — Other Ambulatory Visit: Payer: Self-pay

## 2016-05-17 DIAGNOSIS — I350 Nonrheumatic aortic (valve) stenosis: Secondary | ICD-10-CM | POA: Insufficient documentation

## 2016-05-17 DIAGNOSIS — I5030 Unspecified diastolic (congestive) heart failure: Secondary | ICD-10-CM | POA: Diagnosis not present

## 2016-05-17 DIAGNOSIS — I340 Nonrheumatic mitral (valve) insufficiency: Secondary | ICD-10-CM | POA: Insufficient documentation

## 2016-05-17 DIAGNOSIS — I420 Dilated cardiomyopathy: Secondary | ICD-10-CM | POA: Insufficient documentation

## 2016-05-17 DIAGNOSIS — I517 Cardiomegaly: Secondary | ICD-10-CM | POA: Diagnosis not present

## 2016-05-19 ENCOUNTER — Encounter: Payer: Self-pay | Admitting: Family Medicine

## 2016-05-19 ENCOUNTER — Ambulatory Visit (INDEPENDENT_AMBULATORY_CARE_PROVIDER_SITE_OTHER): Payer: Medicare Other | Admitting: Family Medicine

## 2016-05-19 VITALS — BP 120/80 | HR 82 | Temp 98.6°F | Wt 252.6 lb

## 2016-05-19 DIAGNOSIS — R059 Cough, unspecified: Secondary | ICD-10-CM

## 2016-05-19 DIAGNOSIS — R05 Cough: Secondary | ICD-10-CM | POA: Diagnosis not present

## 2016-05-19 DIAGNOSIS — R062 Wheezing: Secondary | ICD-10-CM

## 2016-05-19 DIAGNOSIS — I2510 Atherosclerotic heart disease of native coronary artery without angina pectoris: Secondary | ICD-10-CM

## 2016-05-19 MED ORDER — ALBUTEROL SULFATE HFA 108 (90 BASE) MCG/ACT IN AERS: 2.0000 | INHALATION_SPRAY | RESPIRATORY_TRACT | 1 refills | Status: DC | PRN

## 2016-05-19 MED ORDER — IPRATROPIUM-ALBUTEROL 0.5-2.5 (3) MG/3ML IN SOLN
3.0000 mL | Freq: Once | RESPIRATORY_TRACT | Status: AC
  Administered 2016-05-19: 3 mL via RESPIRATORY_TRACT

## 2016-05-19 MED ORDER — PREDNISONE 10 MG PO TABS: ORAL_TABLET | ORAL | 0 refills | Status: DC

## 2016-05-19 NOTE — Progress Notes (Signed)
Pre visit review using our clinic review tool, if applicable. No additional management support is needed unless otherwise documented below in the visit note. nebul

## 2016-05-19 NOTE — Progress Notes (Signed)
Subjective:     Patient ID: Anna Gamble, female   DOB: 12-23-1935, 81 y.o.   MRN: [id number]  HPI Patient seen with 2 day history of wheezing and cough. Cough occasionally productive of yellow sputum. No fever. She's had some sore throat and some postnasal drip. She does not typically have spring allergies. No documented fever. No history of asthma. Nonsmoker. Wheezing had progressed in intensity today.  Some dyspnea with exertion.  No chest pain.  No nausea, vomiting, or diarrhea.  No increased leg edema. No exacerbating or alleviating symptoms.  She has aortic stenosis followed by cardiology.  Past Medical History:  Diagnosis Date  . Aortic stenosis   . Colon polyp   . Coronary artery disease    aortic stenosis/mild per ECHO  . Dyspnea   . Generalized osteoarthrosis, involving multiple sites   . GERD (gastroesophageal reflux disease)    dilitation  . Hyperlipidemia   . Hypertension   . Impaired fasting glucose   . Obesity   . Personal history of other diseases of digestive system   . PONV (postoperative nausea and vomiting)    Past Surgical History:  Procedure Laterality Date  . APPENDECTOMY    . CHOLECYSTECTOMY    . FOOT SURGERY     rt  . KNEE SURGERY     rt  . LEFT AND RIGHT HEART CATHETERIZATION WITH CORONARY ANGIOGRAM N/A 10/10/2013   Procedure: LEFT AND RIGHT HEART CATHETERIZATION WITH CORONARY ANGIOGRAM;  Surgeon: Burnell Blanks, MD;  Location: [facility] CATH LAB;  Service: Cardiovascular;  Laterality: N/A;  . TONSILLECTOMY    . TOTAL KNEE ARTHROPLASTY  02/01/2011   Procedure: TOTAL KNEE ARTHROPLASTY;  Surgeon: Kerin Salen;  Location: [place];  Service: Orthopedics;  Laterality: Right;  DEPUY/SIGMA    reports that she has never smoked. She has never used smokeless tobacco. She reports that she does not drink alcohol or use drugs. family history includes Heart attack in her father and mother; Other in her son; Sleep apnea in her son; Stroke in her  brother. Allergies  Allergen Reactions  . Lisinopril     REACTION: Cough  . Statins     REACTION: muscle aches with some statins     Review of Systems  Constitutional: Negative for chills and fever.  HENT: Positive for sore throat. Negative for ear pain and sinus pressure.   Respiratory: Positive for cough and wheezing.   Cardiovascular: Negative for chest pain.  Skin: Negative for rash.  Hematological: Negative for adenopathy.       Objective:   Physical Exam  Constitutional: She appears well-developed and well-nourished.  HENT:  Right Ear: External ear normal.  Left Ear: External ear normal.  Mouth/Throat: Oropharynx is clear and moist.  Neck: Neck supple.  Cardiovascular: Normal rate and regular rhythm.   Murmur heard. Pulmonary/Chest: Effort normal. No respiratory distress. She has wheezes. She has no rales.  Musculoskeletal: She exhibits no edema.  Lymphadenopathy:    She has no cervical adenopathy.       Assessment:     Patient presents with 2 day history of cough and wheezing. Pulse oximetry 94%. No respiratory distress at rest.  ?viral trigger.    Plan:     -Nebulizer with DuoNeb given.  Still has some wheezing after neb but symptoms improved. -Albuterol MDI 2 puffs every 4 hours prn cough and wheeze -prednisone taper.  Reviewed possible side effects. -follow up promptly for fever or increased shortness of breath.  Anna Gamble  Anna Sachs MD [facility]

## 2016-05-19 NOTE — Patient Instructions (Signed)
Follow-up immediately for any fever or increasing shortness of breath Try to stay out of heavy pollen for the next few days

## 2016-06-15 ENCOUNTER — Ambulatory Visit (INDEPENDENT_AMBULATORY_CARE_PROVIDER_SITE_OTHER): Payer: Medicare Other | Admitting: Internal Medicine

## 2016-06-15 ENCOUNTER — Encounter: Payer: Self-pay | Admitting: Internal Medicine

## 2016-06-15 VITALS — BP 130/60 | HR 66 | Temp 98.1°F | Ht 62.5 in | Wt 245.4 lb

## 2016-06-15 DIAGNOSIS — I10 Essential (primary) hypertension: Secondary | ICD-10-CM | POA: Diagnosis not present

## 2016-06-15 DIAGNOSIS — R7301 Impaired fasting glucose: Secondary | ICD-10-CM

## 2016-06-15 DIAGNOSIS — Z79899 Other long term (current) drug therapy: Secondary | ICD-10-CM | POA: Diagnosis not present

## 2016-06-15 DIAGNOSIS — K219 Gastro-esophageal reflux disease without esophagitis: Secondary | ICD-10-CM | POA: Diagnosis not present

## 2016-06-15 DIAGNOSIS — E785 Hyperlipidemia, unspecified: Secondary | ICD-10-CM | POA: Diagnosis not present

## 2016-06-15 DIAGNOSIS — E2839 Other primary ovarian failure: Secondary | ICD-10-CM | POA: Diagnosis not present

## 2016-06-15 DIAGNOSIS — Z8781 Personal history of (healed) traumatic fracture: Secondary | ICD-10-CM

## 2016-06-15 DIAGNOSIS — I2510 Atherosclerotic heart disease of native coronary artery without angina pectoris: Secondary | ICD-10-CM | POA: Diagnosis not present

## 2016-06-15 DIAGNOSIS — I350 Nonrheumatic aortic (valve) stenosis: Secondary | ICD-10-CM

## 2016-06-15 DIAGNOSIS — R2689 Other abnormalities of gait and mobility: Secondary | ICD-10-CM

## 2016-06-15 LAB — HEPATIC FUNCTION PANEL
ALBUMIN: 4.1 g/dL (ref 3.5–5.2)
ALK PHOS: 55 U/L (ref 39–117)
ALT: 22 U/L (ref 0–35)
AST: 20 U/L (ref 0–37)
Bilirubin, Direct: 0.1 mg/dL (ref 0.0–0.3)
TOTAL PROTEIN: 6.7 g/dL (ref 6.0–8.3)
Total Bilirubin: 0.6 mg/dL (ref 0.2–1.2)

## 2016-06-15 LAB — BASIC METABOLIC PANEL
BUN: 11 mg/dL (ref 6–23)
CALCIUM: 9.5 mg/dL (ref 8.4–10.5)
CO2: 30 meq/L (ref 19–32)
Chloride: 102 mEq/L (ref 96–112)
Creatinine, Ser: 0.81 mg/dL (ref 0.40–1.20)
GFR: 72.53 mL/min (ref >60.00)
GLUCOSE: 115 mg/dL — AB (ref 70–99)
Potassium: 4.5 mEq/L (ref 3.5–5.1)
Sodium: 140 mEq/L (ref 135–145)

## 2016-06-15 LAB — CBC WITH DIFFERENTIAL/PLATELET
Basophils Absolute: 0 10*3/uL (ref 0.0–0.1)
Basophils Relative: 0.6 % (ref 0.0–3.0)
Eosinophils Absolute: 0.1 10*3/uL (ref 0.0–0.7)
Eosinophils Relative: 2.1 % (ref 0.0–5.0)
HEMATOCRIT: 39.9 % (ref 36.0–46.0)
HEMOGLOBIN: 13.5 g/dL (ref 12.0–15.0)
LYMPHS PCT: 37.2 % (ref 12.0–46.0)
Lymphs Abs: 2.6 10*3/uL (ref 0.7–4.0)
MCHC: 33.7 g/dL (ref 30.0–36.0)
MCV: 96.6 fl (ref 78.0–100.0)
MONOS PCT: 10.1 % (ref 3.0–12.0)
Monocytes Absolute: 0.7 10*3/uL (ref 0.1–1.0)
Neutro Abs: 3.5 10*3/uL (ref 1.4–7.7)
Neutrophils Relative %: 50 % (ref 43.0–77.0)
Platelets: 291 10*3/uL (ref 150.0–400.0)
RBC: 4.13 Mil/uL (ref 3.87–5.11)
RDW: 13.4 % (ref 11.5–15.5)
WBC: 6.9 10*3/uL (ref 4.0–10.5)

## 2016-06-15 LAB — LIPID PANEL
Cholesterol: 182 mg/dL (ref 0–200)
HDL: 54.9 mg/dL (ref >39.00)
NonHDL: 126.84
Total CHOL/HDL Ratio: 3
Triglycerides: 248 mg/dL — ABNORMAL HIGH (ref 0.0–149.0)
VLDL: 49.6 mg/dL — ABNORMAL HIGH (ref 0.0–40.0)

## 2016-06-15 LAB — HEMOGLOBIN A1C: HEMOGLOBIN A1C: 6.9 % — AB (ref 4.6–6.5)

## 2016-06-15 LAB — LDL CHOLESTEROL, DIRECT: Direct LDL: 89 mg/dL

## 2016-06-15 NOTE — Progress Notes (Signed)
Chief Complaint  Patient presents with  . Annual Exam    HPI: Anna Gamble 81 y.o. comes in today for Chronic disease management  Since her last yearly visit she had a sudden onset of hearing loss right ear seen by Dr. Constance Holster no explanation negative MRI gets home hearing back  She had a fall in her garden trying to we fell on her hand with the fracture healed bothers her little bit at times  Still battles with low back pain uses a cane feels that her balance isn't the best.  She had episode of wheezing seen by Dr. Elease Hashimoto about 3 weeks ago treated with prednisone in an inhaler and is a lot better but she had never had this before.  Check white area in the back of her throat no pain swelling no pain  Breathing better   gerd on xegerid dr Earlean Shawl  Who told her to stay onh gets sx if tried to wean .    Health Maintenance  Topic Date Due  . DEXA SCAN  08/30/2002  . INFLUENZA VACCINE  08/18/2016  . MAMMOGRAM  09/15/2016  . TETANUS/TDAP  01/01/2020  . PNA vac Low Risk Adult  Completed   Health Maintenance Review LIFESTYLE:  TAD Sugar beverages: Sleep: hh of 4-5   No pets       Hearing:   Had acute dec   Hearing   Saw dr  Constance Holster and told  Dec  Had mri   Dec  Has some improvement.   Vision:  No limitations at present . Last eye check UTD  Safety:  Has smoke detector and wears seat belts.  No firearms. No excess sun exposure. Sees dentist regularly.  Falls:  Gardening   stubbe d toe broke hand right hand   Advance directive :  Reviewed    Memory: Felt to be good  , no concern from her or her family.  Depression: No anhedonia unusual crying or depressive symptoms  Nutrition: Eats well balanced diet; adequate calcium and vitamin D. No swallowing chewing problems.  Injury:fx hand see above   Other healthcare providers:  Reviewed today .  Social:  Lives with spouse married. No pets.   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 She is independent. 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 GI /GU: No adominal pain, vomiting, change in bowel habits. No blood in the stool. No significant GU symptoms. SKIN/HEME: ,no acute skin rashes suspicious lesions or bleeding. No lymphadenopathy, nodules, masses.  NEURO/ PSYCH:  No neurologic signs such as weakness numbness. No depression anxiety. IMM/ Allergy: No unusual infections.  Allergy .   REST of 12 system review negative except as per HPI   Past Medical History:  Diagnosis Date  . Aortic stenosis   . Colon polyp   . Coronary artery disease    aortic stenosis/mild per ECHO  . Dyspnea   . Generalized osteoarthrosis, involving multiple sites   . GERD (gastroesophageal reflux disease)    dilitation  . Hyperlipidemia   . Hypertension   .  Impaired fasting glucose   . Obesity   . Personal history of other diseases of digestive system   . PONV (postoperative nausea and vomiting)     Family History  Problem Relation Age of Onset  . Coronary artery disease Unknown        female- 1st degree relative  . Coronary artery disease Unknown        female- 1st degree relative  . Hypertension Unknown   . Hyperlipidemia Unknown   . Heart attack Mother        age 2's  . Heart attack Father        age 60's  . Stroke Brother        died   . Other Son        paraplegia 84  . Sleep apnea Son     Social History   Social History  . Marital status: Married    Spouse name: N/A  . Number of children: N/A  . Years of education: N/A   Occupational History  . Retired     [employer]  care of [employer]   Social History Main Topics  . Smoking status: Never Smoker  . Smokeless tobacco: Never Used     Comment: as a teenager  . Alcohol use No  . Drug use: No  . Sexual activity: Not Asked     Comment: quit 40 yrs ago   Other Topics Concern  . None   Social History Narrative   hh of 3 -4  Married   [place] son   At home    Invalid son paraplegia and husband    And grandson recently     No pets   Fluctuating hh membors she cooks and prepares for them              Outpatient Encounter Prescriptions as of 06/15/2016  Medication Sig  . albuterol (PROVENTIL HFA;VENTOLIN HFA) 108 (90 Base) MCG/ACT inhaler Inhale 2 puffs into the lungs every 4 (four) hours as needed for wheezing or shortness of breath.  Marland Kitchen aspirin 81 MG tablet Take 81 mg by mouth daily.  Marland Kitchen CALCIUM PO Take 1 tablet by mouth daily.    . cholecalciferol (VITAMIN D) 1000 UNITS tablet Take 1,000 Units by mouth daily.    Marland Kitchen losartan-hydrochlorothiazide (HYZAAR) 50-12.5 MG tablet Take 1 tablet by mouth  daily  . metoprolol succinate (TOPROL-XL) 50 MG 24 hr tablet TAKE 1 TABLET BY MOUTH  DAILY  . multivitamin (THERAGRAN) per tablet Take 1 tablet by mouth daily.   . Omega-3 1000 MG CAPS Take 1 capsule by mouth 2 (two) times daily.  Marland Kitchen omeprazole-sodium bicarbonate (ZEGERID) 40-1100 MG per capsule Take 1 capsule by mouth daily. 1 capsule daily  . rosuvastatin (CRESTOR) 20 MG tablet Take 20 mg by mouth daily.  . vitamin E 400 UNIT capsule Take 400 Units by mouth daily.  . [DISCONTINUED] predniSONE (DELTASONE) 10 MG tablet Taper as follows: 4-4-4-3-3-2-2-1-1   No facility-administered encounter medications on file as of 06/15/2016.     EXAM:  BP 130/60 (BP Location: Right Arm, Patient Position: Sitting, Cuff Size: Large)   Pulse 66   Temp 98.1 F (36.7 C) (Oral)   Ht 5' 2.5" (1.588 m)   Wt 245 lb 6.4 oz (111.3 kg)   BMI 44.17 kg/m   Body mass index is 44.17 kg/m.  Physical Exam: Vital signs  reviewed NAT:FTDD is a well-developed well-nourished alert cooperative   who appears stated age in no  acute distress.  HEENT: normocephalic atraumatic , Eyes: PERRL EOM's full, conjunctiva clear, Nares: paten,t no deformity discharge or tenderness., Ears: no deformity EAC's clear TMs with normal landmarks. Mouth: clear OP, no lesions, edema.  whitie faded area r   No redness or mass  no redness  Moist mucous membranes. Dentition in adequate repair. NECK: supple without masses, thyromegaly or bruits. CHEST/PULM:  Clear to auscultation and percussion breath sounds equal no wheeze , rales or rhonchi. No chest wall deformities or tenderness. CV: PMI is nondisplaced, S1 S2 no gallops, 2/6 sem usba, rubs. Peripheral pulses are present .No JVD .  ABDOMEN: Bowel sounds normal nontender  No guard or rebound, no hepato splenomegal no CVA tenderness.   Extremtities:  No clubbing cyanosis or edema, no acute joint swelling or redness no focal atrophy  Skin changes  NEURO:  Oriented x3, cranial nerves 3-12 appear to be intact, no obvious focal weakness,gait  Antalgic cane fror back etc   SKIN: No acute rashes normal turgor, color, no bruising or petechiae. Dry skin  PSYCH: Oriented, good eye contact, no obvious depression anxiety, cognition and judgment appear normal. LN: no cervical axillary  adenopathy No noted deficits in memory, attention, and speech.    ASSESSMENT AND PLAN:  Discussed the following assessment and plan:  Functional gait abnormality - Plan: AMB referral to gait training for fall prevention  Essential hypertension - Plan: Basic metabolic panel, Hepatic function panel, Lipid panel, Hemoglobin A1c, CBC with Differential/Platelet  Impaired fasting glucose - Plan: Basic metabolic panel, Hepatic function panel, Lipid panel, Hemoglobin A1c, CBC with Differential/Platelet  Hyperlipidemia, unspecified hyperlipidemia type - Plan: Basic metabolic panel, Hepatic function panel, Lipid panel,  Hemoglobin A1c, CBC with Differential/Platelet  Aortic valve stenosis, etiology of cardiac valve disease unspecified - Plan: Basic metabolic panel, Hepatic function panel, Lipid panel, Hemoglobin A1c, CBC with Differential/Platelet  Morbid obesity, unspecified obesity type (HCC) - Plan: Basic metabolic panel, Hepatic function panel, Lipid panel, Hemoglobin A1c, CBC with Differential/Platelet  Medication management - Plan: CBC with Differential/Platelet  History of fracture due to fall - Plan: DG Bone Density, CBC with Differential/Platelet, AMB referral to gait training for fall prevention  Gastroesophageal reflux disease, esophagitis presence not specified - Plan: CBC with Differential/Platelet  Estrogen deficiency - Plan: DG Bone Density Discussed medical issues healthy weight loss even 5 pounds will be able to help her metabolic musculoskeletal reflux problems. Plan DEXA scan because of age and history of fracture. Follow-up if her wheezing returns consider more evaluation. She appears to be well now and this may have been viral induced suppose. We'll plan referral for fall prevention risk factors for following she is interested. Patient Care Team: [name], [name], MD as PCP - General Melvyn Novas Christena Deem, MD (Pulmonary Disease) Almedia Balls, MD (Orthopedic Surgery) Richmond Campbell, MD (Gastroenterology) Lelon Perla, MD as Attending Physician (Cardiology) Marica Otter, OD (Optometry)  Patient Instructions    Consider  Adaptive exercise for fall prevention .  You will be contacted about a physical therapy appointment referral. Modest weight loss even 5 pounds clean help your reflux symptoms in your back problems. Follow-up with [place] with any progressive symptoms. Make appointment for a bone density on her way out I will put the order in the system.  Will notify you  of labs when available. And dr [name] will be able to view also .  Fu is wheezing returns  For more evaluation.      Fall Prevention in the Home Falls can  cause injuries and can affect people from all age groups. There are many simple things that you can do to make your home safe and to help prevent falls. What can I do on the outside of my home?  Regularly repair the edges of walkways and driveways and fix any cracks.  Remove high doorway thresholds.  Trim any shrubbery on the main path into your home.  Use bright outdoor lighting.  Clear walkways of debris and clutter, including tools and rocks.  Regularly check that handrails are securely fastened and in good repair. Both sides of any steps should have handrails.  Install guardrails along the edges of any raised decks or porches.  Have leaves, snow, and ice cleared regularly.  Use sand or salt on walkways during winter months.  In the garage, clean up any spills right away, including grease or oil spills. What can I do in the bathroom?  Use night lights.  Install grab bars by the toilet and in the tub and shower. Do not use towel bars as grab bars.  Use non-skid mats or decals on the floor of the tub or shower.  If you need to sit down while you are in the shower, use a plastic, non-slip stool.  Keep the floor dry. Immediately clean up any water that spills on the floor.  Remove soap buildup in the tub or shower on a regular basis.  Attach bath mats securely with double-sided non-slip rug tape.  Remove throw rugs and other tripping hazards from the floor. What can I do in the bedroom?  Use night lights.  Make sure that a bedside light is easy to reach.  Do not use oversized bedding that drapes onto the floor.  Have a firm chair that has side arms to use for getting dressed.  Remove throw rugs and other tripping hazards from the floor. What can I do in the kitchen?  Clean up any spills right away.  Avoid walking on wet floors.  Place frequently used items in easy-to-reach places.  If you need to reach for something  above you, use a sturdy step stool that has a grab bar.  Keep electrical cables out of the way.  Do not use floor polish or wax that makes floors slippery. If you have to use wax, make sure that it is non-skid floor wax.  Remove throw rugs and other tripping hazards from the floor. What can I do in the stairways?  Do not leave any items on the stairs.  Make sure that there are handrails on both sides of the stairs. Fix handrails that are broken or loose. Make sure that handrails are as long as the stairways.  Check any carpeting to make sure that it is firmly attached to the stairs. Fix any carpet that is loose or worn.  Avoid having throw rugs at the top or bottom of stairways, or secure the rugs with carpet tape to prevent them from moving.  Make sure that you have a light switch at the top of the stairs and the bottom of the stairs. If you do not have them, have them installed. What are some other fall prevention tips?  Wear closed-toe shoes that fit well and support your feet. Wear shoes that have rubber soles or low heels.  When you use a stepladder, make sure that it is completely opened and that the sides are firmly locked. Have someone hold the ladder while you are using it. Do not climb a closed stepladder.  Add color or contrast paint or tape to grab bars and handrails in your home. Place contrasting color strips on the first and last steps.  Use mobility aids as needed, such as canes, walkers, scooters, and crutches.  Turn on lights if it is dark. Replace any light bulbs that burn out.  Set up furniture so that there are clear paths. Keep the furniture in the same spot.  Fix any uneven floor surfaces.  Choose a carpet design that does not hide the edge of steps of a stairway.  Be aware of any and all pets.  Review your medicines with your healthcare provider. Some medicines can cause dizziness or changes in blood pressure, which increase your risk of falling. Talk  with your health care provider about other ways that you can decrease your risk of falls. This may include working with a physical therapist or trainer to improve your strength, balance, and endurance. This information is not intended to replace advice given to you by your health care provider. Make sure you discuss any questions you have with your health care provider. Document Released: 12/25/2001 Document Revised: 06/03/2015 Document Reviewed: 02/08/2014 Elsevier Interactive Patient Education  2017 [place] protect organs, store calcium, and anchor muscles. Good health habits, such as eating nutritious foods and exercising regularly, are important for maintaining healthy bones. They can also help to prevent a condition that causes bones to lose density and become weak and brittle (osteoporosis). Why is bone mass important? Bone mass refers to the amount of bone tissue that you have. The higher your bone mass, the stronger your bones. An important step toward having healthy bones throughout life is to have strong and dense bones during childhood. A young adult who has a high bone mass is more likely to have a high bone mass later in life. Bone mass at its greatest it is called peak bone mass. A large decline in bone mass occurs in older adults. In women, it occurs about the time of menopause. During this time, it is important to practice good health habits, because if more bone is lost than what is replaced, the bones will become less healthy and more likely to break (fracture). If you find that you have a low bone mass, you may be able to prevent osteoporosis or further bone loss by changing your diet and lifestyle. How can I find out if my bone mass is low? Bone mass can be measured with an X-ray test that is called a bone mineral density (BMD) test. This test is recommended for all women who are age 67 or older. It may also be recommended for men who are age 18 or older, or  for people who are more likely to develop osteoporosis due to:  Having bones that break easily.  Having a long-term disease that weakens bones, such as kidney disease or rheumatoid arthritis.  Having menopause earlier than normal.  Taking medicine that weakens bones, such as steroids, thyroid hormones, or hormone treatment for breast cancer or prostate cancer.  Smoking.  Drinking three or more alcoholic drinks each day. What are the nutritional recommendations for healthy bones? To have healthy bones, you need to get enough of the right minerals and vitamins. Most nutrition experts recommend getting these nutrients from the foods that you eat. Nutritional recommendations vary from person to person. Ask your health care provider what is healthy for you. Here are some general guidelines. Calcium Recommendations  Calcium is the most important (  essential) mineral for bone health. Most people can get enough calcium from their diet, but supplements may be recommended for people who are at risk for osteoporosis. Good sources of calcium include:  Dairy products, such as low-fat or nonfat milk, cheese, and yogurt.  Dark green leafy vegetables, such as bok choy and broccoli.  Calcium-fortified foods, such as orange juice, cereal, bread, soy beverages, and tofu products.  Nuts, such as almonds. Follow these recommended amounts for daily calcium intake:  Children, age 678?3: 700 mg.  Children, age 67?8: 1,000 mg.  Children, age 20?13: 1,300 mg.  Teens, age 39?18: 1,300 mg.  Adults, age 72?50: 1,000 mg.  Adults, age 678?70:  Men: 1,000 mg.  Women: 1,200 mg.  Adults, age 20 or older: 1,200 mg.  Pregnant and breastfeeding females:  Teens: 1,300 mg.  Adults: 1,000 mg. Vitamin D Recommendations  Vitamin D is the most essential vitamin for bone health. It helps the body to absorb calcium. Sunlight stimulates the skin to make vitamin D, so be sure to get enough sunlight. If you live in a  cold climate or you do not get outside often, your health care provider may recommend that you take vitamin D supplements. Good sources of vitamin D in your diet include:  Egg yolks.  Saltwater fish.  Milk and cereal fortified with vitamin D. Follow these recommended amounts for daily vitamin D intake:  Children and teens, age 71?18: 63 international units.  Adults, age 84 or younger: 400-800 international units.  Adults, age 38 or older: 800-1,000 international units. Other Nutrients  Other nutrients for bone health include:  Phosphorus. This mineral is found in meat, poultry, dairy foods, nuts, and legumes. The recommended daily intake for adult men and adult women is 700 mg.  Magnesium. This mineral is found in seeds, nuts, dark green vegetables, and legumes. The recommended daily intake for adult men is 400?420 mg. For adult women, it is 310?320 mg.  Vitamin K. This vitamin is found in green leafy vegetables. The recommended daily intake is 120 mg for adult men and 90 mg for adult women. What type of physical activity is best for building and maintaining healthy bones? Weight-bearing and strength-building activities are important for building and maintaining peak bone mass. Weight-bearing activities cause muscles and bones to work against gravity. Strength-building activities increases muscle strength that supports bones. Weight-bearing and muscle-building activities include:  Walking and hiking.  Jogging and running.  Dancing.  Gym exercises.  Lifting weights.  Tennis and racquetball.  Climbing stairs.  Aerobics. Adults should get at least 30 minutes of moderate physical activity on most days. Children should get at least 60 minutes of moderate physical activity on most days. Ask your health care provide what type of exercise is best for you. Where can I find more information? For more information, check out the following websites:  [place]:  [employer]  [employer] of Health: http://www.niams.AnonymousEar.fr.asp This information is not intended to replace advice given to you by your health care provider. Make sure you discuss any questions you have with your health care provider. Document Released: 03/27/2003 Document Revised: 07/25/2015 Document Reviewed: 01/09/2014 Elsevier Interactive Patient Education  2017 [place] K. [name] M.D.

## 2016-06-15 NOTE — Patient Instructions (Addendum)
Consider  Adaptive exercise for fall prevention .  You will be contacted about a physical therapy appointment referral. Modest weight loss even 5 pounds clean help your reflux symptoms in your back problems. Follow-up with [place] with any progressive symptoms. Make appointment for a bone density on her way out I will put the order in the system.  Will notify you  of labs when available. And dr [name] will be able to view also .  Fu is wheezing returns  For more evaluation.    Fall Prevention in the Home Falls can cause injuries and can affect people from all age groups. There are many simple things that you can do to make your home safe and to help prevent falls. What can I do on the outside of my home?  Regularly repair the edges of walkways and driveways and fix any cracks.  Remove high doorway thresholds.  Trim any shrubbery on the main path into your home.  Use bright outdoor lighting.  Clear walkways of debris and clutter, including tools and rocks.  Regularly check that handrails are securely fastened and in good repair. Both sides of any steps should have handrails.  Install guardrails along the edges of any raised decks or porches.  Have leaves, snow, and ice cleared regularly.  Use sand or salt on walkways during winter months.  In the garage, clean up any spills right away, including grease or oil spills. What can I do in the bathroom?  Use night lights.  Install grab bars by the toilet and in the tub and shower. Do not use towel bars as grab bars.  Use non-skid mats or decals on the floor of the tub or shower.  If you need to sit down while you are in the shower, use a plastic, non-slip stool.  Keep the floor dry. Immediately clean up any water that spills on the floor.  Remove soap buildup in the tub or shower on a regular basis.  Attach bath mats securely with double-sided non-slip rug tape.  Remove throw rugs and other tripping hazards from the  floor. What can I do in the bedroom?  Use night lights.  Make sure that a bedside light is easy to reach.  Do not use oversized bedding that drapes onto the floor.  Have a firm chair that has side arms to use for getting dressed.  Remove throw rugs and other tripping hazards from the floor. What can I do in the kitchen?  Clean up any spills right away.  Avoid walking on wet floors.  Place frequently used items in easy-to-reach places.  If you need to reach for something above you, use a sturdy step stool that has a grab bar.  Keep electrical cables out of the way.  Do not use floor polish or wax that makes floors slippery. If you have to use wax, make sure that it is non-skid floor wax.  Remove throw rugs and other tripping hazards from the floor. What can I do in the stairways?  Do not leave any items on the stairs.  Make sure that there are handrails on both sides of the stairs. Fix handrails that are broken or loose. Make sure that handrails are as long as the stairways.  Check any carpeting to make sure that it is firmly attached to the stairs. Fix any carpet that is loose or worn.  Avoid having throw rugs at the top or bottom of stairways, or secure the rugs with carpet tape  to prevent them from moving.  Make sure that you have a light switch at the top of the stairs and the bottom of the stairs. If you do not have them, have them installed. What are some other fall prevention tips?  Wear closed-toe shoes that fit well and support your feet. Wear shoes that have rubber soles or low heels.  When you use a stepladder, make sure that it is completely opened and that the sides are firmly locked. Have someone hold the ladder while you are using it. Do not climb a closed stepladder.  Add color or contrast paint or tape to grab bars and handrails in your home. Place contrasting color strips on the first and last steps.  Use mobility aids as needed, such as canes, walkers,  scooters, and crutches.  Turn on lights if it is dark. Replace any light bulbs that burn out.  Set up furniture so that there are clear paths. Keep the furniture in the same spot.  Fix any uneven floor surfaces.  Choose a carpet design that does not hide the edge of steps of a stairway.  Be aware of any and all pets.  Review your medicines with your healthcare provider. Some medicines can cause dizziness or changes in blood pressure, which increase your risk of falling. Talk with your health care provider about other ways that you can decrease your risk of falls. This may include working with a physical therapist or trainer to improve your strength, balance, and endurance. This information is not intended to replace advice given to you by your health care provider. Make sure you discuss any questions you have with your health care provider. Document Released: 12/25/2001 Document Revised: 06/03/2015 Document Reviewed: 02/08/2014 Elsevier Interactive Patient Education  2017 [place] protect organs, store calcium, and anchor muscles. Good health habits, such as eating nutritious foods and exercising regularly, are important for maintaining healthy bones. They can also help to prevent a condition that causes bones to lose density and become weak and brittle (osteoporosis). Why is bone mass important? Bone mass refers to the amount of bone tissue that you have. The higher your bone mass, the stronger your bones. An important step toward having healthy bones throughout life is to have strong and dense bones during childhood. A young adult who has a high bone mass is more likely to have a high bone mass later in life. Bone mass at its greatest it is called peak bone mass. A large decline in bone mass occurs in older adults. In women, it occurs about the time of menopause. During this time, it is important to practice good health habits, because if more bone is lost than what  is replaced, the bones will become less healthy and more likely to break (fracture). If you find that you have a low bone mass, you may be able to prevent osteoporosis or further bone loss by changing your diet and lifestyle. How can I find out if my bone mass is low? Bone mass can be measured with an X-ray test that is called a bone mineral density (BMD) test. This test is recommended for all women who are age 4 or older. It may also be recommended for men who are age 53 or older, or for people who are more likely to develop osteoporosis due to:  Having bones that break easily.  Having a long-term disease that weakens bones, such as kidney disease or rheumatoid arthritis.  Having menopause earlier  than normal.  Taking medicine that weakens bones, such as steroids, thyroid hormones, or hormone treatment for breast cancer or prostate cancer.  Smoking.  Drinking three or more alcoholic drinks each day. What are the nutritional recommendations for healthy bones? To have healthy bones, you need to get enough of the right minerals and vitamins. Most nutrition experts recommend getting these nutrients from the foods that you eat. Nutritional recommendations vary from person to person. Ask your health care provider what is healthy for you. Here are some general guidelines. Calcium Recommendations  Calcium is the most important (essential) mineral for bone health. Most people can get enough calcium from their diet, but supplements may be recommended for people who are at risk for osteoporosis. Good sources of calcium include:  Dairy products, such as low-fat or nonfat milk, cheese, and yogurt.  Dark green leafy vegetables, such as bok choy and broccoli.  Calcium-fortified foods, such as orange juice, cereal, bread, soy beverages, and tofu products.  Nuts, such as almonds. Follow these recommended amounts for daily calcium intake:  Children, age 16?3: 700 mg.  Children, age 34?8: 1,000  mg.  Children, age 98?13: 1,300 mg.  Teens, age 343?18: 1,300 mg.  Adults, age 50?50: 1,000 mg.  Adults, age 55?70:  Men: 1,000 mg.  Women: 1,200 mg.  Adults, age 79 or older: 1,200 mg.  Pregnant and breastfeeding females:  Teens: 1,300 mg.  Adults: 1,000 mg. Vitamin D Recommendations  Vitamin D is the most essential vitamin for bone health. It helps the body to absorb calcium. Sunlight stimulates the skin to make vitamin D, so be sure to get enough sunlight. If you live in a cold climate or you do not get outside often, your health care provider may recommend that you take vitamin D supplements. Good sources of vitamin D in your diet include:  Egg yolks.  Saltwater fish.  Milk and cereal fortified with vitamin D. Follow these recommended amounts for daily vitamin D intake:  Children and teens, age 68?18: 59 international units.  Adults, age 33 or younger: 400-800 international units.  Adults, age 24 or older: 800-1,000 international units. Other Nutrients  Other nutrients for bone health include:  Phosphorus. This mineral is found in meat, poultry, dairy foods, nuts, and legumes. The recommended daily intake for adult men and adult women is 700 mg.  Magnesium. This mineral is found in seeds, nuts, dark green vegetables, and legumes. The recommended daily intake for adult men is 400?420 mg. For adult women, it is 310?320 mg.  Vitamin K. This vitamin is found in green leafy vegetables. The recommended daily intake is 120 mg for adult men and 90 mg for adult women. What type of physical activity is best for building and maintaining healthy bones? Weight-bearing and strength-building activities are important for building and maintaining peak bone mass. Weight-bearing activities cause muscles and bones to work against gravity. Strength-building activities increases muscle strength that supports bones. Weight-bearing and muscle-building activities include:  Walking and  hiking.  Jogging and running.  Dancing.  Gym exercises.  Lifting weights.  Tennis and racquetball.  Climbing stairs.  Aerobics. Adults should get at least 30 minutes of moderate physical activity on most days. Children should get at least 60 minutes of moderate physical activity on most days. Ask your health care provide what type of exercise is best for you. Where can I find more information? For more information, check out the following websites:  [place]: [employer].se  [employer] of Health:  http://www.niams.AnonymousEar.fr.asp This information is not intended to replace advice given to you by your health care provider. Make sure you discuss any questions you have with your health care provider. Document Released: 03/27/2003 Document Revised: 07/25/2015 Document Reviewed: 01/09/2014 Elsevier Interactive Patient Education  2017 [employer].

## 2016-06-17 ENCOUNTER — Encounter: Payer: Self-pay | Admitting: Physical Therapy

## 2016-06-17 ENCOUNTER — Ambulatory Visit: Payer: Medicare Other | Attending: Internal Medicine | Admitting: Physical Therapy

## 2016-06-17 DIAGNOSIS — R2689 Other abnormalities of gait and mobility: Secondary | ICD-10-CM | POA: Diagnosis not present

## 2016-06-17 DIAGNOSIS — M6281 Muscle weakness (generalized): Secondary | ICD-10-CM

## 2016-06-17 DIAGNOSIS — M545 Low back pain: Secondary | ICD-10-CM | POA: Diagnosis not present

## 2016-06-17 DIAGNOSIS — G8929 Other chronic pain: Secondary | ICD-10-CM | POA: Diagnosis not present

## 2016-06-17 NOTE — Patient Instructions (Signed)

## 2016-06-17 NOTE — Therapy (Signed)
[facility] [address] [place], [place], [address] Phone: [phone]   Fax:  [phone]  Physical Therapy Evaluation  Patient Details  Name: Anna Gamble MRN: [id number] Date of Birth: 25-Jul-1935 Referring Provider: Dr. Shanon Ace  Encounter Date: 06/17/2016      PT End of Session - 06/17/16 1055    Visit Number 1   Number of Visits 10   Date for PT Re-Evaluation 08/12/16   Authorization Type medicare g-code on 10th; Kx on 15th   PT Start Time 1015   PT Stop Time 1050   PT Time Calculation (min) 35 min   Activity Tolerance Patient tolerated treatment well   Behavior During Therapy [facility] for tasks assessed/performed      Past Medical History:  Diagnosis Date  . Aortic stenosis   . Colon polyp   . Coronary artery disease    aortic stenosis/mild per ECHO  . Dyspnea   . Generalized osteoarthrosis, involving multiple sites   . GERD (gastroesophageal reflux disease)    dilitation  . Hyperlipidemia   . Hypertension   . Impaired fasting glucose   . Obesity   . Personal history of other diseases of digestive system   . PONV (postoperative nausea and vomiting)     Past Surgical History:  Procedure Laterality Date  . APPENDECTOMY    . CHOLECYSTECTOMY    . FOOT SURGERY     rt  . KNEE SURGERY     rt  . LEFT AND RIGHT HEART CATHETERIZATION WITH CORONARY ANGIOGRAM N/A 10/10/2013   Procedure: LEFT AND RIGHT HEART CATHETERIZATION WITH CORONARY ANGIOGRAM;  Surgeon: Burnell Blanks, MD;  Location: [facility] CATH LAB;  Service: Cardiovascular;  Laterality: N/A;  . TONSILLECTOMY    . TOTAL KNEE ARTHROPLASTY  02/01/2011   Procedure: TOTAL KNEE ARTHROPLASTY;  Surgeon: Kerin Salen;  Location: [facility];  Service: Orthopedics;  Laterality: Right;  DEPUY/SIGMA    There were no vitals filed for this visit.       Subjective Assessment - 06/17/16 1022    Subjective Patient reports her balance has been gradually  worse.  I fell last fall and broke right hand and hurt back.  Patient has been using a cane since 2013. I have trouble  going down stairs.    Patient Stated Goals Walk without cane.   Currently in Pain? Yes   Pain Score 10-Worst pain ever   Pain Location Back   Pain Orientation Lower   Pain Descriptors / Indicators Discomfort   Pain Type Chronic pain   Pain Onset More than a month ago   Pain Frequency Intermittent   Aggravating Factors  standing and walking   Pain Relieving Factors sitting   Multiple Pain Sites No            [facility] PT Assessment - 06/17/16 0001      Assessment   Medical Diagnosis Z87.81 History of fracture due to fall; R26.89 Functional gait abdnormality   Referring Provider Dr. Shanon Ace   Onset Date/Surgical Date 09/19/15   Prior Therapy none     Precautions   Precautions Fall     Restrictions   Weight Bearing Restrictions No     Balance Screen   Has the patient fallen in the past 6 months No  has not hit the floor but lean on  a chair or door   Has the patient had a decrease in activity level because of a fear of falling?  Yes   Is the patient reluctant to leave their home because of a fear of falling?  No     Home Environment   Living Environment Private residence   Type of [place] One level   Additional Comments sleeps in a recliner due to her breathing     Prior Function   Level of Independence Independent   Vocation Retired   [employer] for the family   Leisure stopped exercise since she hurt her back     Cognition   Overall Cognitive Status Within Functional Limits for tasks assessed     Observation/Other Assessments   Focus on Therapeutic Outcomes (FOTO)  berg balance score is 50% chance of falls     Posture/Postural Control   Posture/Postural Control No significant limitations     ROM / Strength   AROM / PROM / Strength AROM;Strength     AROM   Lumbar Flexion  decreased by 25%   Lumbar Extension decreased by 50%   Lumbar - Right Side Bend decreased by 50%   Lumbar - Left Side Bend decreased by 50%     Strength   Right Hip Extension 4-/5   Right Hip ABduction 3-/5   Left Hip Extension 4-/5   Left Hip ABduction 3-/5   Right Knee Flexion 4/5   Right Knee Extension 4/5   Left Knee Flexion 4/5   Left Knee Extension 4/5     Transfers   Transfers --  must go slow due to obesity and decreased mobility     Ambulation/Gait   Ambulation/Gait Yes   Ambulation/Gait Assistance 6: Modified independent (Device/Increase time)   Assistive device Straight cane   Gait Pattern Decreased step length - right;Decreased step length - left;Decreased stance time - right;Trunk flexed   Gait Comments instructed patient to use the cane in left hand due to weakness in right leg     Standardized Balance Assessment   Standardized Balance Assessment Five Times Sit to Stand;Berg Balance Test   Five times sit to stand comments  16 sec  no hands     Berg Balance Test   Sit to Stand Able to stand without using hands and stabilize independently   Standing Unsupported Able to stand safely 2 minutes   Sitting with Back Unsupported but Feet Supported on Floor or Stool Able to sit safely and securely 2 minutes   Stand to Sit Sits safely with minimal use of hands   Transfers Able to transfer safely, minor use of hands   Standing Unsupported with Eyes Closed Able to stand 10 seconds safely   Standing Ubsupported with Feet Together Able to place feet together independently and stand 1 minute safely   From Standing, Reach Forward with Outstretched Arm Can reach forward >12 cm safely (5")   From Standing Position, Pick up Object from Floor Able to pick up shoe, needs supervision   From Standing Position, Turn to Look Behind Over each Shoulder Looks behind from both sides and weight shifts well   Turn 360 Degrees Able to turn 360 degrees safely one side only in 4 seconds or less   7 sec   Standing Unsupported, Alternately Place Feet on Step/Stool Able to complete 4 steps without aid or supervision   Standing Unsupported, One Foot in Front Able to take small step independently and hold 30 seconds   Standing on One Leg Unable to try or needs assist to prevent  fall   Total Score 45   Berg comment: 50% chance of falling            Objective measurements completed on examination: See above findings.                  PT Education - 06/17/16 1054    Education provided Yes   Education Details tips to avoid falls   Person(s) Educated Patient   Methods Explanation;Demonstration;Verbal cues;Handout   Comprehension Verbalized understanding;Returned demonstration          PT Short Term Goals - 06/17/16 1356      PT SHORT TERM GOAL #1   Title independent with initial HEP   Time 4   Period Weeks   Status New     PT SHORT TERM GOAL #2   Title Improve Berg Balance score >/= 48/56   Time 4   Period Weeks   Status New     PT SHORT TERM GOAL #3   Title reduction in fear of falling >/= 25% due to increased strength and balance   Time 4   Period Weeks   Status New     PT SHORT TERM GOAL #4   Title back pain decreased >/= 25% with walking and standing   Time 4   Period Weeks   Status New           PT Long Term Goals - 06/17/16 1049      PT LONG TERM GOAL #1   Title independent with HEP   Time 8   Period Weeks   Status New     PT LONG TERM GOAL #2   Title improve Berg Balance with score >/= 50/56   Time 8   Period Weeks   Status New     PT LONG TERM GOAL #3   Title increase strength in legs to cook a full meal for her family to >/= 4/5   Time 8   Period Weeks   Status New     PT LONG TERM GOAL #4   Title reduction of fear of falling >/= 50% due to increased strength and Balance.    Time 8   Period Weeks   Status New     PT LONG TERM GOAL #5   Title reduction of back pain >/= 50% with standing and walking   Time 8    Period Weeks   Status New                Plan - 06/17/16 1056    Clinical Impression Statement Patient is a 81 year old female with balance issues and low back pain from her fall on 09/19/2015.  Patient has had lumbar pain since then at level 10/10 with standing and walking.  Patient has weakness in bilateral hips for abduction and extension averaging 3/5.  Bilateral knee strength is 4/5. Berg balance score is 45/56 indicating 50% chance of falls.  SIt to stand 5 times in 16 seconds indicate chance of falls and did not use her hands. Patient ambulates with a single point cane and needed direction to place the cane in the apropriate hand and increased the height of the cane. Patient will benefit from skilled therapy to reduce lumbar pain so she is able to walk with increased balance using a single point cane.    History and Personal Factors relevant to plan of care: right foot surgery; right TKR 02/01/2011; Heart condition, Hypertension; difficulty breathing   Clinical  Presentation Evolving   Clinical Presentation due to: Condition is evolving due to patient is undergoing further testing to determine cause of decreased balance and back pain   Clinical Decision Making Moderate   Rehab Potential Excellent   Clinical Impairments Affecting Rehab Potential right TKR 02/01/2011; risk of falls   PT Frequency 2x / week   PT Duration 8 weeks   PT Treatment/Interventions Cryotherapy;Electrical Stimulation;Moist Heat;Therapeutic activities;Therapeutic exercise;Neuromuscular re-education;Patient/family education;Manual techniques;Dry needling;Taping   PT Next Visit Plan nustep; balance exercsise; modalities for back pain, gait training and LE strength   PT Home Exercise Plan Hip and knee strengthening   Recommended Other Services None   Consulted and Agree with Plan of Care Patient      Patient will benefit from skilled therapeutic intervention in order to improve the following deficits and  impairments:  Abnormal gait, Pain, Decreased strength, Decreased mobility, Decreased balance, Decreased activity tolerance, Decreased endurance, Increased muscle spasms, Difficulty walking, Decreased range of motion  Visit Diagnosis: Muscle weakness (generalized) - Plan: PT plan of care cert/re-cert  Other abnormalities of gait and mobility - Plan: PT plan of care cert/re-cert  Chronic midline low back pain without sciatica - Plan: PT plan of care cert/re-cert      G-Codes - 11/57/26 1501    Functional Assessment Tool Used (Outpatient Only) Berg balance score is 45/56 indicating 50% chance of falls  goal is 50/56 score   Functional Limitation Mobility: Walking and moving around   Mobility: Walking and Moving Around Current Status ([id number]) At least 40 percent but less than 60 percent impaired, limited or restricted   Mobility: Walking and Moving Around Goal Status [phone]) At least 20 percent but less than 40 percent impaired, limited or restricted       Problem List Patient Active Problem List   Diagnosis Date Noted  . Aortic stenosis 04/16/2015  . CAD (coronary artery disease) 03/19/2014  . Generalized abdominal discomfort 01/15/2013  . Back pain 01/15/2013  . Hx of colonoscopy with polypectomy 07/14/2012  . Chest pain 06/17/2011  . Arthritis of knee, right 01/31/2011  . GERD (gastroesophageal reflux disease)   . FOOT PAIN, LEFT 12/31/2009  . FATIGUE 12/31/2009  . COUGH 08/21/2009  . Aortic valve disorder 06/18/2008  . HEADACHE 04/15/2008  . HEART MURMUR, SYSTOLIC 38/45/3646  . UNSPECIFIED OSTEOPOROSIS 03/26/2008  . GEN OSTEOARTHROSIS INVOLVING MULTIPLE SITES 06/05/2007  . IMPAIRED FASTING GLUCOSE 03/23/2007  . FASTING HYPERGLYCEMIA 12/27/2006  . HYPERLIPIDEMIA 07/21/2006  . Obesity 07/21/2006  . Essential hypertension 07/21/2006  . GERD 07/21/2006  . LOW BACK PAIN 07/21/2006  . SOB 07/21/2006  . DIVERTICULITIS, HX OF 07/21/2006    Earlie Counts, PT 06/17/16 3:14  PM   [facility] [address], [place], [place], [address] Phone: [phone]   Fax:  [phone]  Name: Anna Gamble MRN: [id number] Date of Birth: 08/16/1935

## 2016-06-22 ENCOUNTER — Ambulatory Visit: Payer: Medicare Other | Attending: Internal Medicine

## 2016-06-22 DIAGNOSIS — G8929 Other chronic pain: Secondary | ICD-10-CM | POA: Insufficient documentation

## 2016-06-22 DIAGNOSIS — M545 Low back pain: Secondary | ICD-10-CM | POA: Insufficient documentation

## 2016-06-22 DIAGNOSIS — M6281 Muscle weakness (generalized): Secondary | ICD-10-CM

## 2016-06-22 DIAGNOSIS — R2689 Other abnormalities of gait and mobility: Secondary | ICD-10-CM

## 2016-06-22 NOTE — Therapy (Signed)
[facility] [address] [place], [place], [address] Phone: [phone]   Fax:  [phone]  Physical Therapy Treatment  Patient Details  Name: Anna Gamble MRN: [id number] Date of Birth: 1935/09/27 Referring Provider: Dr. Shanon Ace  Encounter Date: 06/22/2016      PT End of Session - 06/22/16 1426    Visit Number 2   Number of Visits 10   Date for PT Re-Evaluation 08/12/16   Authorization Type medicare g-code on 10th; Kx on 15th   PT Start Time 1421   PT Stop Time 1515   PT Time Calculation (min) 54 min   Activity Tolerance Patient tolerated treatment well   Behavior During Therapy [facility] for tasks assessed/performed      Past Medical History:  Diagnosis Date  . Aortic stenosis   . Colon polyp   . Coronary artery disease    aortic stenosis/mild per ECHO  . Dyspnea   . Generalized osteoarthrosis, involving multiple sites   . GERD (gastroesophageal reflux disease)    dilitation  . Hyperlipidemia   . Hypertension   . Impaired fasting glucose   . Obesity   . Personal history of other diseases of digestive system   . PONV (postoperative nausea and vomiting)     Past Surgical History:  Procedure Laterality Date  . APPENDECTOMY    . CHOLECYSTECTOMY    . FOOT SURGERY     rt  . KNEE SURGERY     rt  . LEFT AND RIGHT HEART CATHETERIZATION WITH CORONARY ANGIOGRAM N/A 10/10/2013   Procedure: LEFT AND RIGHT HEART CATHETERIZATION WITH CORONARY ANGIOGRAM;  Surgeon: Burnell Blanks, MD;  Location: [facility] CATH LAB;  Service: Cardiovascular;  Laterality: N/A;  . TONSILLECTOMY    . TOTAL KNEE ARTHROPLASTY  02/01/2011   Procedure: TOTAL KNEE ARTHROPLASTY;  Surgeon: Kerin Salen;  Location: [place];  Service: Orthopedics;  Laterality: Right;  DEPUY/SIGMA    There were no vitals filed for this visit.      Subjective Assessment - 06/22/16 1424    Subjective Pt. doing well reports she has reviewed fall  prevention sheet.  Back feeling better today   Patient Stated Goals Walk without cane.   Currently in Pain? No/denies   Pain Score 0-No pain   Multiple Pain Sites No                         [facility] Adult PT Treatment/Exercise - 06/22/16 1438      Self-Care   Self-Care Other Self-Care Comments   Other Self-Care Comments  importance of proper hydration to prevent cramping discussed with pt. with pt. verbalizing understanding      Neuro Re-ed    Neuro Re-ed Details  Alternating toe-touch to 6" step with light HH support x 12 reps; required sitting rest break following due to shortness of breath     Knee/Hip Exercises: Aerobic   Nustep Lvl 3, 7 min      Knee/Hip Exercises: Standing   Hip Extension 15 reps;Knee straight;Right;Left;Stengthening;1 set   Extension Limitations at 45 dg angle; 2 UE on counter   Other Standing Knee Exercises --     Knee/Hip Exercises: Seated   Long Arc Quad Right;Left;1 set;15 reps   Long Arc Quad Weight 2 lbs.   Long [employer] Limitations with adduction ball squeeze    Marching Right;Left;1 set;15 reps;Strengthening   Marching Limitations with green TB around knees  Hamstring Curl Right;Left;15 reps;1 set;Strengthening   Hamstring Limitations with green TB    Sit to Sand 10 reps;1 set;with UE support     Knee/Hip Exercises: Supine   Bridges Both;5 reps;1 set   Bridges Limitations terminated after 5 reps due to HS cramping   Bridges with [employer] 10 reps;1 set;Both;Strengthening  with adduction ball squeeze    Bridges with Clamshell 10 reps;1 set;Both  with sustained hip abd/ER with green TB    Straight Leg Raises Right;Left;10 reps;1 set;Strengthening   Straight Leg Raises Limitations opposite knee bent    Other Supine Knee/Hip Exercises Abdom. bracing with LE march with green TB around knees x 15 reps each side   Other Supine Knee/Hip Exercises Supine hip abduction/adduction x 15 reps; pillow case around shoes     Knee/Hip  Exercises: Sidelying   Clams B clam shell without resistance x 15 reps                PT Education - 06/22/16 1522    Education provided Yes   Education Details bridge adduction pillow squeeze, bridge hip abduction with red TB issued to pt., SLR (with opposite knee bent), sidelying clam shell (no resistance)   Person(s) Educated Patient   Methods Explanation;Demonstration;Verbal cues;Handout   Comprehension Verbalized understanding;Returned demonstration;Verbal cues required;Need further instruction          PT Short Term Goals - 06/22/16 1427      PT SHORT TERM GOAL #1   Title independent with initial HEP   Time 4   Period Weeks   Status On-going     PT SHORT TERM GOAL #2   Title Improve Berg Balance score >/= 48/56   Time 4   Period Weeks   Status On-going     PT SHORT TERM GOAL #3   Title reduction in fear of falling >/= 25% due to increased strength and balance   Time 4   Period Weeks   Status On-going     PT SHORT TERM GOAL #4   Title back pain decreased >/= 25% with walking and standing   Time 4   Period Weeks   Status On-going           PT Long Term Goals - 06/22/16 1427      PT LONG TERM GOAL #1   Title independent with HEP   Time 8   Period Weeks   Status On-going     PT LONG TERM GOAL #2   Title improve Berg Balance with score >/= 50/56   Time 8   Period Weeks   Status On-going     PT LONG TERM GOAL #3   Title increase strength in legs to cook a full meal for her family to >/= 4/5   Time 8   Period Weeks   Status On-going     PT LONG TERM GOAL #4   Title reduction of fear of falling >/= 50% due to increased strength and Balance.    Time 8   Period Weeks   Status On-going     PT LONG TERM GOAL #5   Title reduction of back pain >/= 50% with standing and walking   Time 8   Period Weeks   Status On-going               Plan - 06/22/16 1429    Clinical Impression Statement Pt. doing well today.  Creation and  demonstration of basic LE strengthening HEP today with pt.  able to perform all activities well.  LE cramping x 5 in today's visit, which quickly resolved with rest.  Pt. admitting to possibility of poor hydration today and instructed on importance of proper hydration to prevent cramping.  Will plan to monitor pt. response to HEP and progress per tolerance in coming visits.   PT Treatment/Interventions Cryotherapy;Electrical Stimulation;Moist Heat;Therapeutic activities;Therapeutic exercise;Neuromuscular re-education;Patient/family education;Manual techniques;Dry needling;Taping   PT Next Visit Plan Review HEP; Nustep; balance exercsise; modalities for back pain, gait training and LE strength       Patient will benefit from skilled therapeutic intervention in order to improve the following deficits and impairments:  Abnormal gait, Pain, Decreased strength, Decreased mobility, Decreased balance, Decreased activity tolerance, Decreased endurance, Increased muscle spasms, Difficulty walking, Decreased range of motion  Visit Diagnosis: Muscle weakness (generalized)  Other abnormalities of gait and mobility  Chronic midline low back pain without sciatica     Problem List Patient Active Problem List   Diagnosis Date Noted  . Aortic stenosis 04/16/2015  . CAD (coronary artery disease) 03/19/2014  . Generalized abdominal discomfort 01/15/2013  . Back pain 01/15/2013  . Hx of colonoscopy with polypectomy 07/14/2012  . Chest pain 06/17/2011  . Arthritis of knee, right 01/31/2011  . GERD (gastroesophageal reflux disease)   . FOOT PAIN, LEFT 12/31/2009  . FATIGUE 12/31/2009  . COUGH 08/21/2009  . Aortic valve disorder 06/18/2008  . HEADACHE 04/15/2008  . HEART MURMUR, SYSTOLIC 19/14/7829  . UNSPECIFIED OSTEOPOROSIS 03/26/2008  . GEN OSTEOARTHROSIS INVOLVING MULTIPLE SITES 06/05/2007  . IMPAIRED FASTING GLUCOSE 03/23/2007  . FASTING HYPERGLYCEMIA 12/27/2006  . HYPERLIPIDEMIA 07/21/2006  .  Obesity 07/21/2006  . Essential hypertension 07/21/2006  . GERD 07/21/2006  . LOW BACK PAIN 07/21/2006  . SOB 07/21/2006  . DIVERTICULITIS, HX OF 07/21/2006    Bess Harvest, PTA 06/22/16 5:33 PM  [facility] [address], [place], [place], [address] Phone: [phone]   Fax:  [phone]  Name: Anna Gamble MRN: [id number] Date of Birth: [birth year]

## 2016-06-24 ENCOUNTER — Ambulatory Visit (INDEPENDENT_AMBULATORY_CARE_PROVIDER_SITE_OTHER)
Admission: RE | Admit: 2016-06-24 | Discharge: 2016-06-24 | Disposition: A | Discharge status: Home / Self Care | Payer: Medicare Other | Source: Ambulatory Visit | Attending: Internal Medicine | Admitting: Internal Medicine

## 2016-06-24 DIAGNOSIS — Z8781 Personal history of (healed) traumatic fracture: Secondary | ICD-10-CM

## 2016-06-24 DIAGNOSIS — E2839 Other primary ovarian failure: Secondary | ICD-10-CM | POA: Diagnosis not present

## 2016-06-29 ENCOUNTER — Encounter: Payer: Medicare Other | Admitting: Physical Therapy

## 2016-07-06 ENCOUNTER — Ambulatory Visit: Payer: Medicare Other | Admitting: Physical Therapy

## 2016-07-06 DIAGNOSIS — M545 Low back pain: Secondary | ICD-10-CM | POA: Diagnosis not present

## 2016-07-06 DIAGNOSIS — M6281 Muscle weakness (generalized): Secondary | ICD-10-CM

## 2016-07-06 DIAGNOSIS — R2689 Other abnormalities of gait and mobility: Secondary | ICD-10-CM

## 2016-07-06 DIAGNOSIS — G8929 Other chronic pain: Secondary | ICD-10-CM | POA: Diagnosis not present

## 2016-07-06 NOTE — Therapy (Signed)
[facility] [address] [place], [place], [address] Phone: [phone]   Fax:  [phone]  Physical Therapy Treatment  Patient Details  Name: Anna Gamble MRN: [id number] Date of Birth: 03/06/1935 Referring Provider: Dr. Shanon Ace  Encounter Date: 07/06/2016      PT End of Session - 07/06/16 1211    Visit Number 3   Number of Visits 10   Date for PT Re-Evaluation 08/12/16   Authorization Type medicare g-code on 10th; Kx on 15th   PT Start Time 1145   PT Stop Time 1230   PT Time Calculation (min) 45 min   Activity Tolerance Patient tolerated treatment well      Past Medical History:  Diagnosis Date  . Aortic stenosis   . Colon polyp   . Coronary artery disease    aortic stenosis/mild per ECHO  . Dyspnea   . Generalized osteoarthrosis, involving multiple sites   . GERD (gastroesophageal reflux disease)    dilitation  . Hyperlipidemia   . Hypertension   . Impaired fasting glucose   . Obesity   . Personal history of other diseases of digestive system   . PONV (postoperative nausea and vomiting)     Past Surgical History:  Procedure Laterality Date  . APPENDECTOMY    . CHOLECYSTECTOMY    . FOOT SURGERY     rt  . KNEE SURGERY     rt  . LEFT AND RIGHT HEART CATHETERIZATION WITH CORONARY ANGIOGRAM N/A 10/10/2013   Procedure: LEFT AND RIGHT HEART CATHETERIZATION WITH CORONARY ANGIOGRAM;  Surgeon: Burnell Blanks, MD;  Location: [facility] CATH LAB;  Service: Cardiovascular;  Laterality: N/A;  . TONSILLECTOMY    . TOTAL KNEE ARTHROPLASTY  02/01/2011   Procedure: TOTAL KNEE ARTHROPLASTY;  Surgeon: Kerin Salen;  Location: [facility];  Service: Orthopedics;  Laterality: Right;  DEPUY/SIGMA    There were no vitals filed for this visit.      Subjective Assessment - 07/06/16 1148    Subjective My back hurts a lot in the mornings and I have to take Alleve.  Better after 2 hours.  Not painful now since  took Alleve.  The fall really triggered an increase of chronic back pain.  Reports increased soreness after last visit.  My balance is terrible.   I had trouble keeping my balance while watering my garden.  I also have trouble recovering when I stub my toe.     Currently in Pain? No/denies   Pain Score 0-No pain   Pain Location Back   Pain Orientation Lower   Pain Type Chronic pain   Aggravating Factors  standing and walking   Pain Relieving Factors sitting or lying down                          [facility] Adult PT Treatment/Exercise - 07/06/16 0001      Therapeutic Activites    Therapeutic Activities Other Therapeutic Activities   Other Therapeutic Activities sit to stand, walking, standing     Neuro Re-ed    Neuro Re-ed Details  balance;  core muscle activation, gluteals, ankle anterior tibialis activation for balance     Lumbar Exercises: Seated   Other Seated Lumbar Exercises 2# plyo ball hip to hip, hip to shoulder, Vs 10x each     Knee/Hip Exercises: Stretches   Active Hamstring Stretch Right;Left;2 reps;30 seconds   Active Hamstring Stretch Limitations on footstool  Knee/Hip Exercises: Aerobic   Nustep L1 8 min  while discussing status, pain assessment     Knee/Hip Exercises: Standing   Heel Raises Both;10 reps   Heel Raises Limitations UE reaches and head turns    Other Standing Knee Exercises weight shifting 4 ways without UE support   Other Standing Knee Exercises step taps 8x     Knee/Hip Exercises: Seated   Clamshell with TheraBand Green  30x   Other Seated Knee/Hip Exercises foam roll press downs 10x;     Sit to Sand 10 reps;without UE support  from [facility]                  PT Short Term Goals - 07/06/16 1344      PT SHORT TERM GOAL #1   Title independent with initial HEP   Time 4   Period Weeks   Status On-going     PT SHORT TERM GOAL #2   Title Improve Berg Balance score >/= 48/56   Time 4   Period Weeks   Status On-going      PT SHORT TERM GOAL #3   Title reduction in fear of falling >/= 25% due to increased strength and balance   Time 4   Period Weeks   Status On-going     PT SHORT TERM GOAL #4   Title back pain decreased >/= 25% with walking and standing   Time 4   Period Weeks   Status On-going           PT Long Term Goals - 07/06/16 1344      PT LONG TERM GOAL #1   Title independent with HEP   Time 8   Period Weeks   Status On-going     PT LONG TERM GOAL #2   Title improve Berg Balance with score >/= 50/56   Time 8   Period Weeks   Status On-going     PT LONG TERM GOAL #3   Title increase strength in legs to cook a full meal for her family to >/= 4/5   Time 8   Period Weeks   Status On-going     PT LONG TERM GOAL #4   Title reduction of fear of falling >/= 50% due to increased strength and Balance.    Time 8   Period Weeks   Status On-going     PT LONG TERM GOAL #5   Title reduction of back pain >/= 50% with standing and walking   Time 8   Period Weeks   Status On-going               Plan - 07/06/16 1335    Clinical Impression Statement The patient has stenosis-like symptoms (pain with standing, relieved with sitting) which limits her ability to stand more than 2-3 minutes at a time to work on dynamic standing balance ex.  Her back pain does improve quickly with sitting.  Difficulty weight shifting in anterior/posterior directions with loss of balance requiring UE assist to stabilize.  Also needs UE assist with single limb ex.  Close supervision to monitor and modify symptoms and for safety secondary to fall risk.     PT Frequency 2x / week   PT Duration 8 weeks   PT Treatment/Interventions Cryotherapy;Electrical Stimulation;Moist Heat;Therapeutic activities;Therapeutic exercise;Neuromuscular re-education;Patient/family education;Manual techniques;Dry needling;Taping   PT Next Visit Plan  Nustep; balance ex; may need sitting rest breaks between standing ex;   modalities for back pain, core and  LE strength       Patient will benefit from skilled therapeutic intervention in order to improve the following deficits and impairments:  Abnormal gait, Pain, Decreased strength, Decreased mobility, Decreased balance, Decreased activity tolerance, Decreased endurance, Increased muscle spasms, Difficulty walking, Decreased range of motion  Visit Diagnosis: Muscle weakness (generalized)  Other abnormalities of gait and mobility  Chronic midline low back pain without sciatica     Problem List Patient Active Problem List   Diagnosis Date Noted  . Aortic stenosis 04/16/2015  . CAD (coronary artery disease) 03/19/2014  . Generalized abdominal discomfort 01/15/2013  . Back pain 01/15/2013  . Hx of colonoscopy with polypectomy 07/14/2012  . Chest pain 06/17/2011  . Arthritis of knee, right 01/31/2011  . GERD (gastroesophageal reflux disease)   . FOOT PAIN, LEFT 12/31/2009  . FATIGUE 12/31/2009  . COUGH 08/21/2009  . Aortic valve disorder 06/18/2008  . HEADACHE 04/15/2008  . HEART MURMUR, SYSTOLIC 76/80/8811  . UNSPECIFIED OSTEOPOROSIS 03/26/2008  . GEN OSTEOARTHROSIS INVOLVING MULTIPLE SITES 06/05/2007  . IMPAIRED FASTING GLUCOSE 03/23/2007  . FASTING HYPERGLYCEMIA 12/27/2006  . HYPERLIPIDEMIA 07/21/2006  . Obesity 07/21/2006  . Essential hypertension 07/21/2006  . GERD 07/21/2006  . LOW BACK PAIN 07/21/2006  . SOB 07/21/2006  . DIVERTICULITIS, HX OF 07/21/2006   Ruben Im, PT 07/06/16 1:46 PM Phone: [phone] Fax: [phone]  Alvera Singh 07/06/2016, 1:46 PM  [facility] [address], [place], [place], [address] Phone: [phone]   Fax:  [phone]  Name: Anna Gamble MRN: [id number] Date of Birth: December 15, 1935

## 2016-07-13 ENCOUNTER — Ambulatory Visit: Payer: Medicare Other | Admitting: Physical Therapy

## 2016-07-13 DIAGNOSIS — R2689 Other abnormalities of gait and mobility: Secondary | ICD-10-CM

## 2016-07-13 DIAGNOSIS — M545 Low back pain, unspecified: Secondary | ICD-10-CM

## 2016-07-13 DIAGNOSIS — M6281 Muscle weakness (generalized): Secondary | ICD-10-CM

## 2016-07-13 DIAGNOSIS — G8929 Other chronic pain: Secondary | ICD-10-CM | POA: Diagnosis not present

## 2016-07-13 NOTE — Therapy (Signed)
[facility] [address] [place], [place], [address] Phone: [phone]   Fax:  [phone]  Physical Therapy Treatment  Patient Details  Name: Anna Gamble MRN: [id number] Date of Birth: 02-12-1935 Referring Provider: Dr. Shanon Ace  Encounter Date: 07/13/2016      PT End of Session - 07/13/16 1501    Visit Number 4   Number of Visits 10   Date for PT Re-Evaluation 08/12/16   Authorization Type medicare g-code on 10th; Kx on 15th   PT Start Time 1445   PT Stop Time 1530   PT Time Calculation (min) 45 min   Activity Tolerance Patient tolerated treatment well      Past Medical History:  Diagnosis Date  . Aortic stenosis   . Colon polyp   . Coronary artery disease    aortic stenosis/mild per ECHO  . Dyspnea   . Generalized osteoarthrosis, involving multiple sites   . GERD (gastroesophageal reflux disease)    dilitation  . Hyperlipidemia   . Hypertension   . Impaired fasting glucose   . Obesity   . Personal history of other diseases of digestive system   . PONV (postoperative nausea and vomiting)     Past Surgical History:  Procedure Laterality Date  . APPENDECTOMY    . CHOLECYSTECTOMY    . FOOT SURGERY     rt  . KNEE SURGERY     rt  . LEFT AND RIGHT HEART CATHETERIZATION WITH CORONARY ANGIOGRAM N/A 10/10/2013   Procedure: LEFT AND RIGHT HEART CATHETERIZATION WITH CORONARY ANGIOGRAM;  Surgeon: Burnell Blanks, MD;  Location: [facility] CATH LAB;  Service: Cardiovascular;  Laterality: N/A;  . TONSILLECTOMY    . TOTAL KNEE ARTHROPLASTY  02/01/2011   Procedure: TOTAL KNEE ARTHROPLASTY;  Surgeon: Kerin Salen;  Location: [facility];  Service: Orthopedics;  Laterality: Right;  DEPUY/SIGMA    There were no vitals filed for this visit.      Subjective Assessment - 07/13/16 1445    Subjective Some soreness after last visit.  States she fell in her garden on Saturday when she bent forward to tend to her  squash and lost her balance forward.   She reports it took her about 20 minutes to get up.     Currently in Pain? Yes   Pain Score 5    Pain Location Back   Pain Orientation Lower   Pain Type Chronic pain   Aggravating Factors  rising, standing, walking                         [facility] Adult PT Treatment/Exercise - 07/13/16 0001      Therapeutic Activites    Other Therapeutic Activities sit to stand, walking, standing     Neuro Re-ed    Neuro Re-ed Details  balance;  core muscle activation, gluteals, ankle anterior tibialis activation for balance     Lumbar Exercises: Seated   Sit to Stand Limitations foam roll push downs 10x   Other Seated Lumbar Exercises 3# weight hip to hip, hip to shoulder, Vs 10x each     Knee/Hip Exercises: Stretches   Active Hamstring Stretch Right;Left;2 reps;30 seconds   Active Hamstring Stretch Limitations on footstool     Knee/Hip Exercises: Aerobic   Nustep L1 10 min  while discussing status, pain assessment     Knee/Hip Exercises: Standing   Heel Raises Both;10 reps   Heel Raises Limitations UE reaches  and head turns   forward and up   Hip Extension 10 reps;Knee straight;Right;Left;Stengthening;1 set   Extension Limitations at 45 dg angle; 2 UE on counter   Other Standing Knee Exercises weight shifting 4 ways without UE support   Other Standing Knee Exercises step taps 10x     Knee/Hip Exercises: Seated   Long Arc Quad Strengthening;Right;10 reps   Long Arc Quad Limitations green band   Clamshell with TheraBand Green  30x   Sit to [employer] 10 reps;without UE support  from [employer]                  PT Short Term Goals - 07/13/16 1510      PT SHORT TERM GOAL #1   Title independent with initial HEP   Time 4   Period Weeks   Status On-going     PT SHORT TERM GOAL #2   Title Improve Berg Balance score >/= 48/56   Time 4   Period Weeks   Status On-going     PT SHORT TERM GOAL #3   Title reduction in fear of  falling >/= 25% due to increased strength and balance   Time 4   Period Weeks   Status On-going     PT SHORT TERM GOAL #4   Title back pain decreased >/= 25% with walking and standing   Time 4   Period Weeks   Status On-going           PT Long Term Goals - 07/13/16 1511      PT LONG TERM GOAL #1   Title independent with HEP   Time 8   Period Weeks   Status On-going     PT LONG TERM GOAL #2   Title improve Berg Balance with score >/= 50/56   Time 8   Period Weeks   Status On-going     PT LONG TERM GOAL #3   Title increase strength in legs to cook a full meal for her family to >/= 4/5   Time 8   Period Weeks   Status On-going     PT LONG TERM GOAL #4   Title reduction of fear of falling >/= 50% due to increased strength and Balance.    Time 8   Period Weeks   Status On-going     PT LONG TERM GOAL #5   Title reduction of back pain >/= 50% with standing and walking   Time 8   Period Weeks   Status On-going               Plan - 07/13/16 1501    Clinical Impression Statement The patient has stooped over posture which may contribute to balance impairments.  Pain limits ability to stand more than a few minutes at a time secondary to back pain.  Mild shortness of breath with activity.  Decreased weight shift in all directions.  Needs close supervision for safety.     Clinical Impairments Affecting Rehab Potential right TKR 02/01/2011; risk of falls   PT Frequency 2x / week   PT Duration 8 weeks   PT Treatment/Interventions Cryotherapy;Electrical Stimulation;Moist Heat;Therapeutic activities;Therapeutic exercise;Neuromuscular re-education;Patient/family education;Manual techniques;Dry needling;Taping   PT Next Visit Plan  Nustep; balance ex; may need sitting rest breaks between standing ex;  modalities for back pain, core and LE strength       Patient will benefit from skilled therapeutic intervention in order to improve the following deficits and impairments:   Abnormal  gait, Pain, Decreased strength, Decreased mobility, Decreased balance, Decreased activity tolerance, Decreased endurance, Increased muscle spasms, Difficulty walking, Decreased range of motion  Visit Diagnosis: Muscle weakness (generalized)  Other abnormalities of gait and mobility  Chronic midline low back pain without sciatica     Problem List Patient Active Problem List   Diagnosis Date Noted  . Aortic stenosis 04/16/2015  . CAD (coronary artery disease) 03/19/2014  . Generalized abdominal discomfort 01/15/2013  . Back pain 01/15/2013  . Hx of colonoscopy with polypectomy 07/14/2012  . Chest pain 06/17/2011  . Arthritis of knee, right 01/31/2011  . GERD (gastroesophageal reflux disease)   . FOOT PAIN, LEFT 12/31/2009  . FATIGUE 12/31/2009  . COUGH 08/21/2009  . Aortic valve disorder 06/18/2008  . HEADACHE 04/15/2008  . HEART MURMUR, SYSTOLIC 03/52/4818  . UNSPECIFIED OSTEOPOROSIS 03/26/2008  . GEN OSTEOARTHROSIS INVOLVING MULTIPLE SITES 06/05/2007  . IMPAIRED FASTING GLUCOSE 03/23/2007  . FASTING HYPERGLYCEMIA 12/27/2006  . HYPERLIPIDEMIA 07/21/2006  . Obesity 07/21/2006  . Essential hypertension 07/21/2006  . GERD 07/21/2006  . LOW BACK PAIN 07/21/2006  . SOB 07/21/2006  . DIVERTICULITIS, HX OF 07/21/2006   Ruben Im, PT 07/13/16 3:25 PM Phone: [phone] Fax: [phone]  Alvera Singh 07/13/2016, Ester Rink PM  [facility] [address] [place], [place], [address] Phone: [phone]   Fax:  [phone]  Name: Anna Gamble MRN: [id number] Date of Birth: 09-Jan-1936

## 2016-07-20 ENCOUNTER — Ambulatory Visit: Payer: Medicare Other | Attending: Internal Medicine | Admitting: Physical Therapy

## 2016-07-20 ENCOUNTER — Encounter: Payer: Self-pay | Admitting: Physical Therapy

## 2016-07-20 DIAGNOSIS — G8929 Other chronic pain: Secondary | ICD-10-CM | POA: Diagnosis not present

## 2016-07-20 DIAGNOSIS — R2689 Other abnormalities of gait and mobility: Secondary | ICD-10-CM | POA: Insufficient documentation

## 2016-07-20 DIAGNOSIS — M545 Low back pain: Secondary | ICD-10-CM | POA: Insufficient documentation

## 2016-07-20 DIAGNOSIS — M6281 Muscle weakness (generalized): Secondary | ICD-10-CM | POA: Diagnosis not present

## 2016-07-20 NOTE — Therapy (Signed)
[facility] [address] [place], [place], [address] Phone: [phone]   Fax:  [phone]  Physical Therapy Treatment  Patient Details  Name: Anna Gamble MRN: [id number] Date of Birth: 1935/07/05 Referring Provider: Dr. Shanon Ace  Encounter Date: 07/20/2016      PT End of Session - 07/20/16 1525    Visit Number 5   Number of Visits 10   Date for PT Re-Evaluation 08/12/16   Authorization Type medicare g-code on 10th; Kx on 15th   PT Start Time 1445   PT Stop Time 1526   PT Time Calculation (min) 41 min   Activity Tolerance Patient tolerated treatment well   Behavior During Therapy [facility] for tasks assessed/performed      Past Medical History:  Diagnosis Date  . Aortic stenosis   . Colon polyp   . Coronary artery disease    aortic stenosis/mild per ECHO  . Dyspnea   . Generalized osteoarthrosis, involving multiple sites   . GERD (gastroesophageal reflux disease)    dilitation  . Hyperlipidemia   . Hypertension   . Impaired fasting glucose   . Obesity   . Personal history of other diseases of digestive system   . PONV (postoperative nausea and vomiting)     Past Surgical History:  Procedure Laterality Date  . APPENDECTOMY    . CHOLECYSTECTOMY    . FOOT SURGERY     rt  . KNEE SURGERY     rt  . LEFT AND RIGHT HEART CATHETERIZATION WITH CORONARY ANGIOGRAM N/A 10/10/2013   Procedure: LEFT AND RIGHT HEART CATHETERIZATION WITH CORONARY ANGIOGRAM;  Surgeon: Burnell Blanks, MD;  Location: [facility] CATH LAB;  Service: Cardiovascular;  Laterality: N/A;  . TONSILLECTOMY    . TOTAL KNEE ARTHROPLASTY  02/01/2011   Procedure: TOTAL KNEE ARTHROPLASTY;  Surgeon: Kerin Salen;  Location: [place];  Service: Orthopedics;  Laterality: Right;  DEPUY/SIGMA    There were no vitals filed for this visit.      Subjective Assessment - 07/20/16 1450    Subjective I have not fallen since then. I ride the bike every  other day. When I lift the right leg, it has a heavy feeling.    Patient Stated Goals Walk without cane.   Currently in Pain? Yes   Pain Score 5    Pain Location Back   Pain Orientation Lower   Pain Descriptors / Indicators Discomfort   Pain Type Chronic pain   Pain Onset More than a month ago   Aggravating Factors  standing, walking   Pain Relieving Factors sitting or lying down            [facility] PT Assessment - 07/20/16 0001      Strength   Right Knee Flexion 4+/5   Right Knee Extension 4+/5   Left Knee Flexion 4+/5   Left Knee Extension 4+/5                     [facility] Adult PT Treatment/Exercise - 07/20/16 0001      Knee/Hip Exercises: Aerobic   Nustep L1 10 min; seat # 8, Arms #11  while discussing status, pain assessment     Knee/Hip Exercises: Standing   Heel Raises Both;10 reps   Heel Raises Limitations UE reaches and head turns   forward and up   Hip Extension 10 reps;Knee straight;Right;Left;Stengthening;1 set   Extension Limitations at 45 dg angle; 2 UE on counter  Other Standing Knee Exercises weight shifting 4 ways without UE support   Other Standing Knee Exercises step taps 10x2     Knee/Hip Exercises: Seated   Long Arc Quad Right;Left;Strengthening;3 sets;10 reps   Clamshell with [employer]  30x   Sit to [employer] 10 reps;without UE support  from [facility]                  PT Short Term Goals - 07/20/16 1455      PT SHORT TERM GOAL #1   Title independent with initial HEP   Time 4   Period Weeks   Status Achieved     PT SHORT TERM GOAL #2   Title Improve Berg Balance score >/= 48/56   Time 4   Period Weeks   Status On-going     PT SHORT TERM GOAL #3   Title reduction in fear of falling >/= 25% due to increased strength and balance   Time 4   Period Weeks   Status On-going  has not due to recent fall last week     PT SHORT TERM GOAL #4   Title back pain decreased >/= 25% with walking and standing   Time 4   Period  Weeks   Status On-going  not sure           PT Long Term Goals - 07/13/16 1511      PT LONG TERM GOAL #1   Title independent with HEP   Time 8   Period Weeks   Status On-going     PT LONG TERM GOAL #2   Title improve Berg Balance with score >/= 50/56   Time 8   Period Weeks   Status On-going     PT LONG TERM GOAL #3   Title increase strength in legs to cook a full meal for her family to >/= 4/5   Time 8   Period Weeks   Status On-going     PT LONG TERM GOAL #4   Title reduction of fear of falling >/= 50% due to increased strength and Balance.    Time 8   Period Weeks   Status On-going     PT LONG TERM GOAL #5   Title reduction of back pain >/= 50% with standing and walking   Time 8   Period Weeks   Status On-going               Plan - 07/20/16 1453    Clinical Impression Statement Patient has increased strength of bilateral knees. Patient is still having balance issues and is unsteady on her feet during therapy.  Patient needs verbal cues to focus on her exercise so she is able to stay steady.  Patient reports she still has a fear of falling and no changes in her back pain.  Patient will benefit from skilled therapy to reduce pain and improve balance.    Rehab Potential Excellent   Clinical Impairments Affecting Rehab Potential right TKR 02/01/2011; risk of falls   PT Frequency 2x / week   PT Duration 8 weeks   PT Treatment/Interventions Cryotherapy;Electrical Stimulation;Moist Heat;Therapeutic activities;Therapeutic exercise;Neuromuscular re-education;Patient/family education;Manual techniques;Dry needling;Taping   PT Next Visit Plan  Nustep; balance ex; may need sitting rest breaks between standing ex;  modalities for back pain, core and LE strength ; Berg balance test   PT Home Exercise Plan progress as needed   Recommended Other Services initial cert signed on 08/25/5025   Consulted and Agree  with Plan of Care Patient      Patient will benefit from  skilled therapeutic intervention in order to improve the following deficits and impairments:  Abnormal gait, Pain, Decreased strength, Decreased mobility, Decreased balance, Decreased activity tolerance, Decreased endurance, Increased muscle spasms, Difficulty walking, Decreased range of motion  Visit Diagnosis: Muscle weakness (generalized)  Other abnormalities of gait and mobility  Chronic midline low back pain without sciatica     Problem List Patient Active Problem List   Diagnosis Date Noted  . Aortic stenosis 04/16/2015  . CAD (coronary artery disease) 03/19/2014  . Generalized abdominal discomfort 01/15/2013  . Back pain 01/15/2013  . Hx of colonoscopy with polypectomy 07/14/2012  . Chest pain 06/17/2011  . Arthritis of knee, right 01/31/2011  . GERD (gastroesophageal reflux disease)   . FOOT PAIN, LEFT 12/31/2009  . FATIGUE 12/31/2009  . COUGH 08/21/2009  . Aortic valve disorder 06/18/2008  . HEADACHE 04/15/2008  . HEART MURMUR, SYSTOLIC 25/05/3974  . UNSPECIFIED OSTEOPOROSIS 03/26/2008  . GEN OSTEOARTHROSIS INVOLVING MULTIPLE SITES 06/05/2007  . IMPAIRED FASTING GLUCOSE 03/23/2007  . FASTING HYPERGLYCEMIA 12/27/2006  . HYPERLIPIDEMIA 07/21/2006  . Obesity 07/21/2006  . Essential hypertension 07/21/2006  . GERD 07/21/2006  . LOW BACK PAIN 07/21/2006  . SOB 07/21/2006  . DIVERTICULITIS, HX OF 07/21/2006    Earlie Counts, PT 07/20/16 3:30 PM   [facility] [address], [place], [place], [address] Phone: [phone]   Fax:  [phone]  Name: Anna Gamble MRN: [id number] Date of Birth: [birth year]

## 2016-07-27 ENCOUNTER — Ambulatory Visit: Payer: Medicare Other

## 2016-07-27 DIAGNOSIS — M6281 Muscle weakness (generalized): Secondary | ICD-10-CM

## 2016-07-27 DIAGNOSIS — M545 Low back pain, unspecified: Secondary | ICD-10-CM

## 2016-07-27 DIAGNOSIS — R2689 Other abnormalities of gait and mobility: Secondary | ICD-10-CM | POA: Diagnosis not present

## 2016-07-27 DIAGNOSIS — G8929 Other chronic pain: Secondary | ICD-10-CM | POA: Diagnosis not present

## 2016-07-27 NOTE — Therapy (Signed)
[facility] [address] [place], [place], [address] Phone: [phone]   Fax:  [phone]  Physical Therapy Treatment  Patient Details  Name: Anna Gamble MRN: [id number] Date of Birth: [birth year] Referring Provider: Dr. Shanon Ace  Encounter Date: 07/27/2016      PT End of Session - 07/27/16 1456    Visit Number 6   Number of Visits 10   Date for PT Re-Evaluation 08/12/16   Authorization Type medicare g-code on 10th; Kx on 15th   PT Start Time 1450   PT Stop Time 1531   PT Time Calculation (min) 41 min   Activity Tolerance Patient tolerated treatment well   Behavior During Therapy [facility] for tasks assessed/performed      Past Medical History:  Diagnosis Date  . Aortic stenosis   . Colon polyp   . Coronary artery disease    aortic stenosis/mild per ECHO  . Dyspnea   . Generalized osteoarthrosis, involving multiple sites   . GERD (gastroesophageal reflux disease)    dilitation  . Hyperlipidemia   . Hypertension   . Impaired fasting glucose   . Obesity   . Personal history of other diseases of digestive system   . PONV (postoperative nausea and vomiting)     Past Surgical History:  Procedure Laterality Date  . APPENDECTOMY    . CHOLECYSTECTOMY    . FOOT SURGERY     rt  . KNEE SURGERY     rt  . LEFT AND RIGHT HEART CATHETERIZATION WITH CORONARY ANGIOGRAM N/A 10/10/2013   Procedure: LEFT AND RIGHT HEART CATHETERIZATION WITH CORONARY ANGIOGRAM;  Surgeon: Burnell Blanks, MD;  Location[facility] CATH LAB;  Service: Cardiovascular;  Laterality: N/A;  . TONSILLECTOMY    . TOTAL KNEE ARTHROPLASTY  02/01/2011   Procedure: TOTAL KNEE ARTHROPLASTY;  Surgeon: Kerin Salen;  Location: [place];  Service: Orthopedics;  Laterality: Right;  DEPUY/SIGMA    There were no vitals filed for this visit.      Subjective Assessment - 07/27/16 1452    Subjective Pt. doing well today.     Patient Stated Goals  Walk without cane.   Currently in Pain? Yes   Pain Score 5    Pain Location Back   Pain Orientation Lower   Pain Descriptors / Indicators Discomfort   Pain Type Chronic pain   Pain Onset More than a month ago   Pain Frequency Intermittent   Aggravating Factors  standing, walking   Pain Relieving Factors Sitting    Multiple Pain Sites Yes   Pain Score 5   Pain Location Knee   Pain Orientation Left   Pain Descriptors / Indicators Sharp   Pain Type Chronic pain   Pain Onset More than a month ago            [facility] PT Assessment - 07/27/16 1504      Berg Balance Test   Sit to Stand Able to stand without using hands and stabilize independently   Standing Unsupported Able to stand safely 2 minutes   Sitting with Back Unsupported but Feet Supported on Floor or Stool Able to sit safely and securely 2 minutes   Stand to Sit Sits safely with minimal use of hands   Transfers Able to transfer safely, minor use of hands   Standing Unsupported with Eyes Closed Able to stand 10 seconds safely   Standing Ubsupported with Feet Together Able to place feet together independently and stand  1 minute safely   From Standing, Reach Forward with Outstretched Arm Can reach forward >12 cm safely (5")   From Standing Position, Pick up Object from [place] to pick up shoe, needs supervision   From Standing Position, Turn to Look Behind Over each Shoulder Looks behind from both sides and weight shifts well   Turn 360 Degrees Able to turn 360 degrees safely in 4 seconds or less   Standing Unsupported, Alternately Place Feet on Step/Stool Able to stand independently and safely and complete 8 steps in 20 seconds   Standing Unsupported, One Foot in [place] to take small step independently and hold 30 seconds   Standing on One Leg Tries to lift leg/unable to hold 3 seconds but remains standing independently   Total Score 49                     [facility] Adult PT Treatment/Exercise - 07/27/16  1521      Neuro Re-ed    Neuro Re-ed Details  light UE support on counter: B standing tendem stance 2 x 20 sec each; B staggered stance 2 x 20 sec each       Knee/Hip Exercises: Aerobic   Nustep L1, 10 min      Knee/Hip Exercises: Standing   Other Standing Knee Exercises Alternating toe touch to 8" step 2 x 20 sec                 PT Education - 07/27/16 1632    Education provided Yes   Education Details Tandem stance at counter, staggered stance at [employer]) Educated Patient   Methods Explanation;Demonstration;Verbal cues;Handout   Comprehension Verbalized understanding;Returned demonstration;Verbal cues required;Need further instruction          PT Short Term Goals - 07/27/16 1649      PT SHORT TERM GOAL #1   Title independent with initial HEP   Time 4   Period Weeks   Status Achieved     PT SHORT TERM GOAL #2   Title Improve Berg Balance score >/= 48/56   Time 4   Period Weeks   Status Achieved  7.10.18: 49/56 BERG balance score     PT SHORT TERM GOAL #3   Title reduction in fear of falling >/= 25% due to increased strength and balance   Time 4   Period Weeks   Status On-going  has not due to recent fall last week     PT SHORT TERM GOAL #4   Title back pain decreased >/= 25% with walking and standing   Time 4   Period Weeks   Status On-going  not sure           PT Long Term Goals - 07/13/16 1511      PT LONG TERM GOAL #1   Title independent with HEP   Time 8   Period Weeks   Status On-going     PT LONG TERM GOAL #2   Title improve Berg Balance with score >/= 50/56   Time 8   Period Weeks   Status On-going     PT LONG TERM GOAL #3   Title increase strength in legs to cook a full meal for her family to >/= 4/5   Time 8   Period Weeks   Status On-going     PT LONG TERM GOAL #4   Title reduction of fear of falling >/= 50% due to increased strength and  Balance.    Time 8   Period Weeks   Status On-going     PT LONG  TERM GOAL #5   Title reduction of back pain >/= 50% with standing and walking   Time 8   Period Weeks   Status On-going               Plan - 07/27/16 1458    Clinical Impression Statement Pt. doing well today.  Performed well with BERG balance testing today able to improve score to 49/56 indicating reduced risk for falls.  Remainder of treatment focusing on balance training for tandem, staggered stance, and stepping activities.  HEP updated with balance activities at counter.  Progressing well at this point.    PT Treatment/Interventions Cryotherapy;Electrical Stimulation;Moist Heat;Therapeutic activities;Therapeutic exercise;Neuromuscular re-education;Patient/family education;Manual techniques;Dry needling;Taping   PT Next Visit Plan  Nustep; balance ex; may need sitting rest breaks between standing ex;  modalities for back pain, core and LE strength      Patient will benefit from skilled therapeutic intervention in order to improve the following deficits and impairments:  Abnormal gait, Pain, Decreased strength, Decreased mobility, Decreased balance, Decreased activity tolerance, Decreased endurance, Increased muscle spasms, Difficulty walking, Decreased range of motion  Visit Diagnosis: Muscle weakness (generalized)  Other abnormalities of gait and mobility  Chronic midline low back pain without sciatica     Problem List Patient Active Problem List   Diagnosis Date Noted  . Aortic stenosis 04/16/2015  . CAD (coronary artery disease) 03/19/2014  . Generalized abdominal discomfort 01/15/2013  . Back pain 01/15/2013  . Hx of colonoscopy with polypectomy 07/14/2012  . Chest pain 06/17/2011  . Arthritis of knee, right 01/31/2011  . GERD (gastroesophageal reflux disease)   . FOOT PAIN, LEFT 12/31/2009  . FATIGUE 12/31/2009  . COUGH 08/21/2009  . Aortic valve disorder 06/18/2008  . HEADACHE 04/15/2008  . HEART MURMUR, SYSTOLIC 16/07/3708  . UNSPECIFIED OSTEOPOROSIS  03/26/2008  . GEN OSTEOARTHROSIS INVOLVING MULTIPLE SITES 06/05/2007  . IMPAIRED FASTING GLUCOSE 03/23/2007  . FASTING HYPERGLYCEMIA 12/27/2006  . HYPERLIPIDEMIA 07/21/2006  . Obesity 07/21/2006  . Essential hypertension 07/21/2006  . GERD 07/21/2006  . LOW BACK PAIN 07/21/2006  . SOB 07/21/2006  . DIVERTICULITIS, HX OF 07/21/2006    Bess Harvest, PTA 07/27/16 4:54 PM  [facility] [address], [place], [place], [address] Phone: [phone]   Fax:  [phone]  Name: Anna Gamble MRN: [id number] Date of Birth: 1935/02/09

## 2016-08-10 ENCOUNTER — Ambulatory Visit: Payer: Self-pay | Admitting: Physical Therapy

## 2016-08-17 ENCOUNTER — Ambulatory Visit: Payer: Medicare Other | Admitting: Physical Therapy

## 2016-08-17 ENCOUNTER — Encounter: Payer: Self-pay | Admitting: Physical Therapy

## 2016-08-17 DIAGNOSIS — R2689 Other abnormalities of gait and mobility: Secondary | ICD-10-CM | POA: Diagnosis not present

## 2016-08-17 DIAGNOSIS — G8929 Other chronic pain: Secondary | ICD-10-CM | POA: Diagnosis not present

## 2016-08-17 DIAGNOSIS — M6281 Muscle weakness (generalized): Secondary | ICD-10-CM

## 2016-08-17 DIAGNOSIS — M545 Low back pain: Secondary | ICD-10-CM | POA: Diagnosis not present

## 2016-08-17 NOTE — Therapy (Signed)
[facility] [address] [place], [place], [address] Phone: [phone]   Fax:  [phone]  Physical Therapy Treatment  Patient Details  Name: Anna Gamble MRN: [id number] Date of Birth: Apr 05, 1935 Referring Provider: Dr. Shanon Ace  Encounter Date: 08/17/2016      PT End of Session - 08/17/16 1512    Date for PT Re-Evaluation 08/20/16   Authorization Type medicare g-code on 10th; Kx on 15th   PT Start Time 1445   PT Stop Time 1525   PT Time Calculation (min) 40 min   Activity Tolerance Patient tolerated treatment well   Behavior During Therapy [facility] for tasks assessed/performed      Past Medical History:  Diagnosis Date  . Aortic stenosis   . Colon polyp   . Coronary artery disease    aortic stenosis/mild per ECHO  . Dyspnea   . Generalized osteoarthrosis, involving multiple sites   . GERD (gastroesophageal reflux disease)    dilitation  . Hyperlipidemia   . Hypertension   . Impaired fasting glucose   . Obesity   . Personal history of other diseases of digestive system   . PONV (postoperative nausea and vomiting)     Past Surgical History:  Procedure Laterality Date  . APPENDECTOMY    . CHOLECYSTECTOMY    . FOOT SURGERY     rt  . KNEE SURGERY     rt  . LEFT AND RIGHT HEART CATHETERIZATION WITH CORONARY ANGIOGRAM N/A 10/10/2013   Procedure: LEFT AND RIGHT HEART CATHETERIZATION WITH CORONARY ANGIOGRAM;  Surgeon: Burnell Blanks, MD;  Location: [facility] CATH LAB;  Service: Cardiovascular;  Laterality: N/A;  . TONSILLECTOMY    . TOTAL KNEE ARTHROPLASTY  02/01/2011   Procedure: TOTAL KNEE ARTHROPLASTY;  Surgeon: Kerin Salen;  Location: [facility];  Service: Orthopedics;  Laterality: Right;  DEPUY/SIGMA    There were no vitals filed for this visit.      Subjective Assessment - 08/17/16 1455    Subjective I want today to be my last day. I am happy with my level.  I fell one time since last visit  on 07/27/2016.     Patient Stated Goals Walk without cane.   Currently in Pain? No/denies            [facility] PT Assessment - 08/17/16 0001      Assessment   Medical Diagnosis Z87.81 History of fracture due to fall; R26.89 Functional gait abdnormality   Referring Provider Dr. Shanon Ace   Onset Date/Surgical Date 09/19/15   Prior Therapy none     Precautions   Precautions Fall     Restrictions   Weight Bearing Restrictions No     Balance Screen   Has the patient fallen in the past 6 months Yes   How many times? 3   Has the patient had a decrease in activity level because of a fear of falling?  No   Is the patient reluctant to leave their home because of a fear of falling?  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   Leisure stopped exercise since she hurt her back     Cognition   Overall Cognitive Status Within Functional Limits for tasks assessed     Observation/Other Assessments   Focus on Therapeutic Outcomes (FOTO)  berg balance score is 50% chance of falls     Posture/Postural Control   Posture/Postural Control No significant limitations     AROM   Lumbar Flexion full   Lumbar Extension decreased by 50%   Lumbar - Right Side Bend full   Lumbar - Left Side Bend full     Strength   Right Hip Extension 4/5   Right Hip ABduction 4-/5   Left Hip Extension 4/5   Left Hip ABduction 4-/5   Right Knee Flexion 5/5   Right Knee Extension 4+/5   Left Knee Flexion 5/5   Left Knee Extension 4+/5     Berg Balance Test   Sit to Stand Able to stand without using hands and stabilize independently   Standing Unsupported Able to stand safely 2 minutes   Sitting with Back Unsupported but Feet Supported on Floor or Stool Able to sit safely and securely 2 minutes   Stand to Sit Sits safely with minimal use of hands   Transfers Able  to transfer safely, minor use of hands   Standing Unsupported with Eyes Closed Able to stand 10 seconds safely   Standing Ubsupported with Feet Together Able to place feet together independently and stand 1 minute safely   From Standing, Reach Forward with Outstretched Arm Can reach forward >12 cm safely (5")   From Standing Position, Pick up Object from Floor Able to pick up shoe, needs supervision   From Standing Position, Turn to Look Behind Over each Shoulder Looks behind from both sides and weight shifts well   Turn 360 Degrees Able to turn 360 degrees safely in 4 seconds or less   Standing Unsupported, Alternately Place Feet on Step/Stool Able to stand independently and safely and complete 8 steps in 20 seconds   Standing Unsupported, One Foot in Front Able to take small step independently and hold 30 seconds   Standing on One Leg Tries to lift leg/unable to hold 3 seconds but remains standing independently   Total Score 49   Berg comment: 50% chance of falling                     [facility] Adult PT Treatment/Exercise - 08/17/16 0001      Knee/Hip Exercises: Aerobic   Nustep L1, 10 min , Seat #8 arm 9     Knee/Hip Exercises: Standing   Hip Abduction Left;Right;Stengthening;1 set;10 reps   Hip Extension Stengthening;Right;Left;1 set;10 reps   Other Standing Knee Exercises tandem stance hold 30 sec. with one foot ahead of other 2 times both ways with abdominal bracing   Other Standing Knee Exercises step taps 10x2     Knee/Hip Exercises: Seated   Long Arc Quad Right;Left;Strengthening;2 sets;10 reps;Weights   Long Arc Quad Weight 1 lbs.   Sit to Sand 10 reps;without UE support  from mat     Knee/Hip Exercises: Supine   Bridges with [employer] 5 reps  worked on not having back pain   Bridges with [employer] Strengthening;10 reps  used red band   Straight Leg Raises Strengthening;Left;Right;1 set;10 reps  vc to breath out when left leg                   PT Short Term Goals - 08/17/16 1515      PT SHORT TERM GOAL #1   Title independent with initial HEP   Time 4   Period Weeks   Status Achieved     PT SHORT TERM  GOAL #2   Title Improve Berg Balance score >/= 48/56   Time 4   Period Weeks   Status Achieved     PT SHORT TERM GOAL #3   Title reduction in fear of falling >/= 25% due to increased strength and balance   Time 4   Period Weeks   Status Achieved     PT SHORT TERM GOAL #4   Title back pain decreased >/= 25% with walking and standing   Time 4   Period Weeks   Status Achieved           PT Long Term Goals - 15-Sep-2016 1516      PT LONG TERM GOAL #1   Title independent with HEP   Time 8   Period Weeks   Status Achieved     PT LONG TERM GOAL #2   Title improve Berg Balance with score >/= 50/56   Time 8   Period Weeks   Status On-going  49/56     PT LONG TERM GOAL #3   Title increase strength in legs to cook a full meal for her family to >/= 4/5   Time 8   Period Weeks   Status Achieved     PT LONG TERM GOAL #4   Title reduction of fear of falling >/= 50% due to increased strength and Balance.    Time 8   Period Weeks   Status Achieved     PT LONG TERM GOAL #5   Title reduction of back pain >/= 50% with standing and walking   Time 8   Period Weeks   Status Not Met  25% better               Plan - September 15, 2016 1521    Clinical Impression Statement Patient balance is 49/56 and has increased since initial evaluation.  Patient has fallen since last visit. Patient still needs her cane.  Patient has 50% confidence in not falling. Patient has met her STG's.  Patient has increased confidence of not falling.  Patient has increased endurance.  Patient able to more energy to do her chores at home.  Patient will need 1 more visit to be placed on HEP.    Rehab Potential Excellent   Clinical Impairments Affecting Rehab Potential right TKR 02/01/2011; risk of falls   PT Frequency 1x / week   PT Duration --   1 week   PT Treatment/Interventions Cryotherapy;Electrical Stimulation;Moist Heat;Therapeutic activities;Therapeutic exercise;Neuromuscular re-education;Patient/family education;Manual techniques;Dry needling;Taping   PT Next Visit Plan work on balance and strength; discharge to [facility] progress as needed   Consulted and Agree with Plan of Care Patient      Patient will benefit from skilled therapeutic intervention in order to improve the following deficits and impairments:  Abnormal gait, Pain, Decreased strength, Decreased mobility, Decreased balance, Decreased activity tolerance, Decreased endurance, Increased muscle spasms, Difficulty walking, Decreased range of motion  Visit Diagnosis: Muscle weakness (generalized) - Plan: PT plan of care cert/re-cert  Other abnormalities of gait and mobility - Plan: PT plan of care cert/re-cert       G-Codes - September 15, 2016 1527    Functional Assessment Tool Used (Outpatient Only) Berg balance score is 49/56 indicating 50% chance of falls   Functional Limitation Mobility: Walking and moving around   Mobility: Walking and Moving Around Goal Status [phone]) At least 20 percent but less than 40 percent impaired, limited or restricted   Mobility: Walking  and Moving Around Discharge Status [phone]) At least 40 percent but less than 60 percent impaired, limited or restricted      Problem List Patient Active Problem List   Diagnosis Date Noted  . Aortic stenosis 04/16/2015  . CAD (coronary artery disease) 03/19/2014  . Generalized abdominal discomfort 01/15/2013  . Back pain 01/15/2013  . Hx of colonoscopy with polypectomy 07/14/2012  . Chest pain 06/17/2011  . Arthritis of knee, right 01/31/2011  . GERD (gastroesophageal reflux disease)   . FOOT PAIN, LEFT 12/31/2009  . FATIGUE 12/31/2009  . COUGH 08/21/2009  . Aortic valve disorder 06/18/2008  . HEADACHE 04/15/2008  . HEART MURMUR, SYSTOLIC 69/79/4801  . UNSPECIFIED  OSTEOPOROSIS 03/26/2008  . GEN OSTEOARTHROSIS INVOLVING MULTIPLE SITES 06/05/2007  . IMPAIRED FASTING GLUCOSE 03/23/2007  . FASTING HYPERGLYCEMIA 12/27/2006  . HYPERLIPIDEMIA 07/21/2006  . Obesity 07/21/2006  . Essential hypertension 07/21/2006  . GERD 07/21/2006  . LOW BACK PAIN 07/21/2006  . SOB 07/21/2006  . DIVERTICULITIS, HX OF 07/21/2006    Earlie Counts, PT 08/17/16 3:32 PM   [facility] [address] [place], [place], [address] Phone: [phone]   Fax:  [phone]  Name: Anna Gamble MRN: [id number] Date of Birth: Jan 13, 1936  PHYSICAL THERAPY DISCHARGE SUMMARY  Visits from Start of Care: 6  Current functional level related to goals / functional outcomes: See above   Remaining deficits: See above   Education / Equipment: HEP Plan: Patient agrees to discharge.  Patient goals were partially met. Patient is being discharged due to being pleased with the current functional level. Thank you for the referral. Earlie Counts, PT 08/17/16 3:32 PM   ?????

## 2016-08-26 ENCOUNTER — Other Ambulatory Visit: Payer: Self-pay | Admitting: Cardiology

## 2016-09-16 DIAGNOSIS — Z1231 Encounter for screening mammogram for malignant neoplasm of breast: Secondary | ICD-10-CM | POA: Diagnosis not present

## 2016-09-16 LAB — HM MAMMOGRAPHY

## 2016-09-27 ENCOUNTER — Encounter: Payer: Self-pay | Admitting: Internal Medicine

## 2016-10-08 ENCOUNTER — Encounter: Payer: Self-pay | Admitting: Internal Medicine

## 2016-10-26 NOTE — Progress Notes (Signed)
Chief Complaint  Patient presents with  . Follow-up    f/u labs. pt has not been working on diet to reduce sugar/carb intake .    HPI: DECIE VERNE 81 y.o. come in for Chronic disease management   Patient states it's been a hard time to lose weight because she hasn't been able to be active. She is used Weight Watchers in the past that didn't help her. States she doesn't have very big portion sizes.  Bakes for other sbut only takes small portions.   Bread is her "weakness" ahs done weight watchers no help  Still sobshe states could be her heart but could be just being overweight as she always has. No current wheezing fever. ROS: See pertinent positives and negatives per HPI.  Past Medical History:  Diagnosis Date  . Aortic stenosis   . Colon polyp   . Coronary artery disease    aortic stenosis/mild per ECHO  . Dyspnea   . Generalized osteoarthrosis, involving multiple sites   . GERD (gastroesophageal reflux disease)    dilitation  . Hyperlipidemia   . Hypertension   . Impaired fasting glucose   . Obesity   . Personal history of other diseases of digestive system   . PONV (postoperative nausea and vomiting)     Family History  Problem Relation Age of Onset  . Coronary artery disease Unknown        female- 1st degree relative  . Coronary artery disease Unknown        female- 1st degree relative  . Hypertension Unknown   . Hyperlipidemia Unknown   . Heart attack Mother        age 61's  . Heart attack Father        age 38's  . Stroke Brother        died   . Other Son        paraplegia 66  . Sleep apnea Son     Social History   Social History  . Marital status: Married    Spouse name: N/A  . Number of children: N/A  . Years of education: N/A   Occupational History  . Retired     [employer] care of [employer]   Social History Main Topics  . Smoking status: Never Smoker  . Smokeless tobacco: Never Used     Comment: as a teenager  . Alcohol use No    . Drug use: No  . Sexual activity: Not Asked     Comment: quit 40 yrs ago   Other Topics Concern  . None   Social History Narrative   hh of 3 -4  Married   [place] son   At home    Invalid son paraplegia and husband    And grandson recently     No pets   Fluctuating hh membors she cooks and prepares for them              Outpatient Medications Prior to Visit  Medication Sig Dispense Refill  . albuterol (PROVENTIL HFA;VENTOLIN HFA) 108 (90 Base) MCG/ACT inhaler Inhale 2 puffs into the lungs every 4 (four) hours as needed for wheezing or shortness of breath. 1 Inhaler 1  . aspirin 81 MG tablet Take 81 mg by mouth daily.    Marland Kitchen CALCIUM PO Take 1 tablet by mouth daily.      . cholecalciferol (VITAMIN D) 1000 UNITS tablet Take 1,000 Units by mouth daily.      Marland Kitchen  losartan-hydrochlorothiazide (HYZAAR) 50-12.5 MG tablet Take 1 tablet by mouth daily. 90 tablet 3  . metoprolol succinate (TOPROL-XL) 50 MG 24 hr tablet Take 1 tablet (50 mg total) by mouth daily. 90 tablet 3  . multivitamin (THERAGRAN) per tablet Take 1 tablet by mouth daily.     . Omega-3 1000 MG CAPS Take 1 capsule by mouth 2 (two) times daily.    Marland Kitchen omeprazole-sodium bicarbonate (ZEGERID) 40-1100 MG per capsule Take 1 capsule by mouth daily. 1 capsule daily    . rosuvastatin (CRESTOR) 20 MG tablet Take 20 mg by mouth daily.    . vitamin E 400 UNIT capsule Take 400 Units by mouth daily.     No facility-administered medications prior to visit.      EXAM:  BP 122/84 (BP Location: Right Arm, Patient Position: Sitting, Cuff Size: Large)   Pulse 63   Temp 97.7 F (36.5 C) (Oral)   Wt 245 lb 12.8 oz (111.5 kg)   BMI 44.24 kg/m   Body mass index is 44.24 kg/m.  GENERAL: vitals reviewed and listed above, alert, oriented, appears well hydrated and in no acute distress HEENT: atraumatic, conjunctiva  clear, no obvious abnormalities on inspection of external nose and ears OP : no lesion edema or exudate  NECK: no obvious  masses on inspection palpation  LUNGS: clear to auscultation bilaterally, no wheezes, rales or rhonchi, CV: HRRR, no clubbing cyanosis or  peripheral edema nl cap refill  Sem usb  abd no masses noted  MS: moves all extremities without noticeable focal  Abnormality [place] with cane   But steady  PSYCH: pleasant and cooperative, no obvious depression or anxiety Lab Results  Component Value Date   WBC 6.9 06/15/2016   HGB 13.5 06/15/2016   HCT 39.9 06/15/2016   PLT 291.0 06/15/2016   GLUCOSE 115 (H) 06/15/2016   CHOL 182 06/15/2016   TRIG 248.0 (H) 06/15/2016   HDL 54.90 06/15/2016   LDLDIRECT 89.0 06/15/2016   LDLCALC 77 06/11/2015   ALT 22 06/15/2016   AST 20 06/15/2016   NA 140 06/15/2016   K 4.5 06/15/2016   CL 102 06/15/2016   CREATININE 0.81 06/15/2016   BUN 11 06/15/2016   CO2 30 06/15/2016   TSH 1.05 04/16/2015   INR 0.93 10/08/2013   HGBA1C 6.3 10/27/2016   BP Readings from Last 3 Encounters:  10/27/16 122/84  06/15/16 130/60  05/19/16 120/80   Wt Readings from Last 3 Encounters:  10/27/16 245 lb 12.8 oz (111.5 kg)  06/15/16 245 lb 6.4 oz (111.3 kg)  05/19/16 252 lb 9.6 oz (114.6 kg)    ASSESSMENT AND PLAN:  Discussed the following assessment and plan:  Impaired fasting glucose - Plan: POC HgB A1c  Essential hypertension  Medication management - Plan: POC HgB A1c  Functional gait abnormality  Need for influenza vaccination - Plan: Flu vaccine HIGH DOSE PF (Fluzone High dose)  Morbid obesity, unspecified obesity type ([place])  Aortic valve stenosis, etiology of cardiac valve disease unspecified   We can add metformin   If needed but  Ok  Cont to work on [employer]  Since stable .sh.sdm Try to discuss other strategies that might be helpful for healthy weight loss. 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 Total visit 28mins > 50% spent counseling  and coordinating care as indicated in above note and in instructions to patient .   -Patient advised to return or notify health care team  if  new concerns arise.  Patient Instructions  Blood sugar   A bit better   Continue   lifes style intervention   Trial of no breads  Or baked goods   Or breaded  Foods ... For 3 weeks and then decide if can continue      Mostly .   Plan yearly visit   6 months      [name]. Torre Schaumburg M.D.

## 2016-10-27 ENCOUNTER — Ambulatory Visit (INDEPENDENT_AMBULATORY_CARE_PROVIDER_SITE_OTHER): Payer: Medicare Other | Admitting: Internal Medicine

## 2016-10-27 ENCOUNTER — Encounter: Payer: Self-pay | Admitting: Internal Medicine

## 2016-10-27 VITALS — BP 122/84 | HR 63 | Temp 97.7°F | Wt 245.8 lb

## 2016-10-27 DIAGNOSIS — Z23 Encounter for immunization: Secondary | ICD-10-CM | POA: Diagnosis not present

## 2016-10-27 DIAGNOSIS — R2689 Other abnormalities of gait and mobility: Secondary | ICD-10-CM

## 2016-10-27 DIAGNOSIS — Z79899 Other long term (current) drug therapy: Secondary | ICD-10-CM

## 2016-10-27 DIAGNOSIS — I10 Essential (primary) hypertension: Secondary | ICD-10-CM | POA: Diagnosis not present

## 2016-10-27 DIAGNOSIS — R7301 Impaired fasting glucose: Secondary | ICD-10-CM

## 2016-10-27 DIAGNOSIS — I350 Nonrheumatic aortic (valve) stenosis: Secondary | ICD-10-CM | POA: Diagnosis not present

## 2016-10-27 DIAGNOSIS — I2510 Atherosclerotic heart disease of native coronary artery without angina pectoris: Secondary | ICD-10-CM

## 2016-10-27 LAB — POCT GLYCOSYLATED HEMOGLOBIN (HGB A1C): HEMOGLOBIN A1C: 6.3

## 2016-10-27 NOTE — Patient Instructions (Addendum)
Blood sugar   A bit better   Continue   lifes style intervention   Trial of no breads  Or baked goods   Or breaded  Foods ... For 3 weeks and then decide if can continue      Mostly .   Plan yearly visit   6 months

## 2016-11-08 NOTE — Progress Notes (Addendum)
Subjective:   Anna Gamble is a 81 y.o. female who presents for Medicare Annual (Subsequent) preventive examination.  The Patient was informed that the wellness visit is to identify future health risk and educate and initiate measures that can reduce risk for increased disease through the lifespan.    Annual Wellness Assessment  Reports health as fine Hard to keep weight down;   Preventive Screening -Counseling & Management  Medicare Annual Preventive Care Visit - Subsequent Last OV 10/27/16   Colonoscopy 04/2012- plans colonoscopy next year due to having polyps  Mammogram 08/2016 Bone Density 06/2016 -1.9 (osteopenia)  PAP c/o of spotty bleeding 2008  There are no preventive care reminders to display for this patient.  Goals loves people  Has new church she is enjoying  [employer] outings   VS reviewed;   Diet  Attempted [employer] but no helping anymore pre last OV Bread is her weakness;  10/10 plan no breads, no baked goods or breaded foods x 3 weeks Weight 225; states her weight went down States she does graze on food and discussed that this will sabotage weight loss  States she eats healthy  Is under a lot of stress    BMI 44  Exercise Has aortic stenosis; sob when she walks a lot Seen Dr. Eulas Post in April   Does she feel more symptomatic than she was in March Will make an apt with cardiology  Does have gerd but also c/o of heaviness in chest  Dr. Eulas Post documented chest pain   Hearing Screening Comments: Can't hear out of right ear Dr. Constance Holster  She is picking up high pitch sounds Does not feel she needs a hearing  Vision Screening Comments: Vision checks one time a year Hx cataract surgery Dr. Marica Otter  Still feels her right eye is not as good    Stressors: has grandson (38) Has anger management issues; [email or web address] Son lives with her as well  Spouse and her will leave and go on a trip or go out to eat Sister who just died with  aortic stenosis; died x 2 years ago  Discussed aging plan as she has children who are dependent on her   Sleep patterns: no issues  Once in awhile she will wake up and can't go back to sleep   Pain- back hurts Hx of turning a lawnmower on her  And remote hx of falling down the stairs     Cardiac Risk Factors Addressed Hyperlipidemia - chol 182; hdl 54; ldl 89; trig 248  Diabetes A1c is 6.3; bs 115  Advanced Directives  Patient Care Team: [name], [name], MD as PCP - General Tanda Rockers, MD (Pulmonary Disease) Almedia Balls, MD (Orthopedic Surgery) Richmond Campbell, MD (Gastroenterology) Lelon Perla, MD as Attending Physician (Cardiology) Marica Otter, OD (Optometry)               Objective:     Vitals: There were no vitals taken for this visit.  There is no height or weight on file to calculate BMI.   Tobacco History  Smoking Status  . Never Smoker  Smokeless Tobacco  . Never Used    Comment: as a teenager     Counseling given: Not Answered   Past Medical History:  Diagnosis Date  . Aortic stenosis   . Colon polyp   . Coronary artery disease    aortic stenosis/mild per ECHO  . Dyspnea   . Generalized osteoarthrosis, involving multiple sites   .  GERD (gastroesophageal reflux disease)    dilitation  . Hyperlipidemia   . Hypertension   . Impaired fasting glucose   . Obesity   . Personal history of other diseases of digestive system   . PONV (postoperative nausea and vomiting)    Past Surgical History:  Procedure Laterality Date  . APPENDECTOMY    . CHOLECYSTECTOMY    . FOOT SURGERY     rt  . KNEE SURGERY     rt  . LEFT AND RIGHT HEART CATHETERIZATION WITH CORONARY ANGIOGRAM N/A 10/10/2013   Procedure: LEFT AND RIGHT HEART CATHETERIZATION WITH CORONARY ANGIOGRAM;  Surgeon: Burnell Blanks, MD;  Location: [facility] CATH LAB;  Service: Cardiovascular;  Laterality: N/A;  . TONSILLECTOMY    . TOTAL KNEE ARTHROPLASTY  02/01/2011    Procedure: TOTAL KNEE ARTHROPLASTY;  Surgeon: Kerin Salen;  Location: [facility];  Service: Orthopedics;  Laterality: Right;  DEPUY/SIGMA   Family History  Problem Relation Age of Onset  . Coronary artery disease Unknown        female- 1st degree relative  . Coronary artery disease Unknown        female- 1st degree relative  . Hypertension Unknown   . Hyperlipidemia Unknown   . Heart attack Mother        age 50's  . Heart attack Father        age 75's  . Stroke Brother        died   . Other Son        paraplegia 47  . Sleep apnea Son    History  Sexual Activity  . Sexual activity: Not on file    Comment: quit 40 yrs ago    Outpatient Encounter Prescriptions as of 11/09/2016  Medication Sig  . albuterol (PROVENTIL HFA;VENTOLIN HFA) 108 (90 Base) MCG/ACT inhaler Inhale 2 puffs into the lungs every 4 (four) hours as needed for wheezing or shortness of breath.  Marland Kitchen aspirin 81 MG tablet Take 81 mg by mouth daily.  Marland Kitchen CALCIUM PO Take 1 tablet by mouth daily.    . cholecalciferol (VITAMIN D) 1000 UNITS tablet Take 1,000 Units by mouth daily.    Marland Kitchen losartan-hydrochlorothiazide (HYZAAR) 50-12.5 MG tablet Take 1 tablet by mouth daily.  . metoprolol succinate (TOPROL-XL) 50 MG 24 hr tablet Take 1 tablet (50 mg total) by mouth daily.  . multivitamin (THERAGRAN) per tablet Take 1 tablet by mouth daily.   . Omega-3 1000 MG CAPS Take 1 capsule by mouth 2 (two) times daily.  Marland Kitchen omeprazole-sodium bicarbonate (ZEGERID) 40-1100 MG per capsule Take 1 capsule by mouth daily. 1 capsule daily  . rosuvastatin (CRESTOR) 20 MG tablet Take 20 mg by mouth daily.  . vitamin E 400 UNIT capsule Take 400 Units by mouth daily.   No facility-administered encounter medications on file as of 11/09/2016.     Activities of Daily Living No flowsheet data found.  Patient Care Team: [name], [name], MD as PCP - General Melvyn Novas Christena Deem, MD (Pulmonary Disease) Almedia Balls, MD (Orthopedic Surgery) Richmond Campbell, MD  (Gastroenterology) Lelon Perla, MD as Attending Physician (Cardiology) Marica Otter, OD (Optometry)    Assessment:     Exercise Activities and Dietary recommendations  no exercise but will start getting on her recumbent bike 30 minutes per day and start with light weights which she has done in the past  Goals    None     Fall Risk Fall Risk  06/15/2016 04/16/2015 01/15/2013 01/14/2012  Falls in the past year? Yes No No No  Number falls in past yr: 1 - - -  Injury with Fall? Yes - - -  Risk for fall due to : - - - Impaired mobility   Depression Screen PHQ 2/9 Scores 06/15/2016 04/16/2015 03/18/2014 01/15/2013  PHQ - 2 Score 0 0 0 0     Cognitive Function        Immunization History  Administered Date(s) Administered  . Influenza Split 09/18/2012  . Influenza Whole 12/27/2006, 11/01/2007, 11/08/2008, 10/29/2009, 12/02/2011  . Influenza, High Dose Seasonal PF 10/27/2016  . Influenza-Unspecified 12/19/2014  . Pneumococcal Conjugate-13 01/15/2013  . Pneumococcal Polysaccharide-23 01/13/2002, 04/16/2015  . Td 03/18/1996, 12/31/2009   Screening Tests Health Maintenance  Topic Date Due  . MAMMOGRAM  09/16/2017  . TETANUS/TDAP  01/01/2020  . INFLUENZA VACCINE  Completed  . DEXA SCAN  Completed  . PNA vac Low Risk Adult  Completed      Plan:      PCP Notes   Health Maintenance There are no preventive care reminders to display for this patient.   Abnormal Screens  BMI 45; Host of issues including "grazing" stress and pain in left foot; also needs Aortic valve surgery and is putting this off Seen note below   Referrals  To Dr. Regis Bill today for test for Sjogrens' as recommended by her eye doctor   Patient concerns; Would like a pap smear due to her sister having cervical cancer  Eyes are dry and was told by Dr. Sabra Heck to have PCP order sjogren  Nurse Concerns; Does she feel more symptomatic than she was in March Will make an apt with cardiology  Does  have gerd but also c/o of heaviness in chest  Dr. Eulas Post documented chest pain    Next PCP apt fup planned today       I have personally reviewed and noted the following in the patient's chart:   . Medical and social history . Use of alcohol, tobacco or illicit drugs  . Current medications and supplements . Functional ability and status . Nutritional status . Physical activity . Advanced directives . List of other physicians . Hospitalizations, surgeries, and ER visits in previous 12 months . Vitals . Screenings to include cognitive, depression, and falls . Referrals and appointments  In addition, I have reviewed and discussed with patient certain preventive protocols, quality metrics, and best practice recommendations. A written personalized care plan for preventive services as well as general preventive health recommendations were provided to patient.     QQIWL,NLGXQ, RN  11/08/2016  Above noted reviewed and agree. Lottie Dawson, MD  See OV   evaluate

## 2016-11-09 ENCOUNTER — Encounter: Payer: Self-pay | Admitting: Internal Medicine

## 2016-11-09 ENCOUNTER — Ambulatory Visit (INDEPENDENT_AMBULATORY_CARE_PROVIDER_SITE_OTHER): Payer: Medicare Other | Admitting: Internal Medicine

## 2016-11-09 ENCOUNTER — Ambulatory Visit (INDEPENDENT_AMBULATORY_CARE_PROVIDER_SITE_OTHER): Payer: Medicare Other

## 2016-11-09 VITALS — BP 138/80 | HR 69 | Wt 247.0 lb

## 2016-11-09 VITALS — BP 138/80 | HR 69 | Ht 62.0 in | Wt 247.0 lb

## 2016-11-09 DIAGNOSIS — H04129 Dry eye syndrome of unspecified lacrimal gland: Secondary | ICD-10-CM | POA: Diagnosis not present

## 2016-11-09 DIAGNOSIS — I2510 Atherosclerotic heart disease of native coronary artery without angina pectoris: Secondary | ICD-10-CM | POA: Diagnosis not present

## 2016-11-09 DIAGNOSIS — M19049 Primary osteoarthritis, unspecified hand: Secondary | ICD-10-CM

## 2016-11-09 DIAGNOSIS — R682 Dry mouth, unspecified: Secondary | ICD-10-CM

## 2016-11-09 DIAGNOSIS — Z0000 Encounter for general adult medical examination without abnormal findings: Secondary | ICD-10-CM

## 2016-11-09 LAB — C-REACTIVE PROTEIN: CRP: 0.1 mg/dL — ABNORMAL LOW (ref 0.5–20.0)

## 2016-11-09 NOTE — Progress Notes (Signed)
Chief Complaint  Patient presents with  . Dry Eye    Has had this issue for several years but has worsened over that last 2 years. Using gel drops and Biotene mouth wash.   . Dry Mouth    HPI: Anna Gamble 81 y.o.  SDA  Today for "dry mouth  And eyes" she was at her wellness visit today and reported that her eye doctor who saw her in the spring told her that she might want to get checked for Sjogren's.  She also has dry mouth but nothing specific. She has ongoing hand and wrist arthritis right hand is worse than left because of a history of injury.  Does report some morning stiffness but works in out uses topical medicines her gastroenterologist Dr. Earlean Shawl recommended tart cherry juice which she tried of uncertain benefit but when she went off have more joint pain so went back on. She cannot take anti-inflammatories.  Family history of arthritis not specifically diagnosed. She has been thinking most of her arthritis is from old age and is bothersome. No history of psoriasis.   ROS: See pertinent positives and negatives per HPI.  Past Medical History:  Diagnosis Date  . Aortic stenosis   . Colon polyp   . Coronary artery disease    aortic stenosis/mild per ECHO  . Dyspnea   . Generalized osteoarthrosis, involving multiple sites   . GERD (gastroesophageal reflux disease)    dilitation  . Hyperlipidemia   . Hypertension   . Impaired fasting glucose   . Obesity   . Personal history of other diseases of digestive system   . PONV (postoperative nausea and vomiting)     Family History  Problem Relation Age of Onset  . Coronary artery disease Unknown        female- 1st degree relative  . Coronary artery disease Unknown        female- 1st degree relative  . Hypertension Unknown   . Hyperlipidemia Unknown   . Heart attack Mother        age 69's  . Heart attack Father        age 32's  . Stroke Brother        died   . Other Son        paraplegia 31  . Sleep apnea Son       Social History   Social History  . Marital status: Married    Spouse name: N/A  . Number of children: N/A  . Years of education: N/A   Occupational History  . Retired     [employer] care of [employer]   Social History Main Topics  . Smoking status: Never Smoker  . Smokeless tobacco: Never Used     Comment: as a teenager  . Alcohol use No  . Drug use: No  . Sexual activity: Not Asked     Comment: quit 40 yrs ago   Other Topics Concern  . None   Social History Narrative   hh of 3 -4  Married   [place] son   At home    Invalid son paraplegia and husband    And grandson recently     No pets   Fluctuating hh membors she cooks and prepares for them              Outpatient Medications Prior to Visit  Medication Sig Dispense Refill  . albuterol (PROVENTIL HFA;VENTOLIN HFA) 108 (90 Base) MCG/ACT inhaler Inhale 2 puffs  into the lungs every 4 (four) hours as needed for wheezing or shortness of breath. 1 Inhaler 1  . aspirin 81 MG tablet Take 81 mg by mouth daily.    Marland Kitchen CALCIUM PO Take 1 tablet by mouth daily.      . cholecalciferol (VITAMIN D) 1000 UNITS tablet Take 1,000 Units by mouth daily.      Marland Kitchen losartan-hydrochlorothiazide (HYZAAR) 50-12.5 MG tablet Take 1 tablet by mouth daily. 90 tablet 3  . metoprolol succinate (TOPROL-XL) 50 MG 24 hr tablet Take 1 tablet (50 mg total) by mouth daily. 90 tablet 3  . multivitamin (THERAGRAN) per tablet Take 1 tablet by mouth daily.     . Omega-3 1000 MG CAPS Take 1 capsule by mouth 2 (two) times daily.    Marland Kitchen omeprazole-sodium bicarbonate (ZEGERID) 40-1100 MG per capsule Take 1 capsule by mouth daily. 1 capsule daily    . rosuvastatin (CRESTOR) 20 MG tablet Take 20 mg by mouth daily.    . vitamin E 400 UNIT capsule Take 400 Units by mouth daily.     No facility-administered medications prior to visit.      EXAM:  BP 138/80 (BP Location: Right Arm, Patient Position: Sitting, Cuff Size: Normal)   Pulse 69   Wt 247 lb (112 kg)    BMI 45.18 kg/m   Body mass index is 45.18 kg/m.  GENERAL: vitals reviewed and listed above, alert, oriented, appears well hydrated and in no acute distress HEENT: atraumatic, conjunctiva  clear, no obvious abnormalities on inspection of external nose and earsMS: moves all extremities right hand with finger digit deformities MTP without ulnar deviation some swelling question synovitis left hand also changes no warmth or redness. PSYCH: pleasant and cooperative, no obvious depression or anxiety Lab Results  Component Value Date   WBC 6.9 06/15/2016   HGB 13.5 06/15/2016   HCT 39.9 06/15/2016   PLT 291.0 06/15/2016   GLUCOSE 115 (H) 06/15/2016   CHOL 182 06/15/2016   TRIG 248.0 (H) 06/15/2016   HDL 54.90 06/15/2016   LDLDIRECT 89.0 06/15/2016   LDLCALC 77 06/11/2015   ALT 22 06/15/2016   AST 20 06/15/2016   NA 140 06/15/2016   K 4.5 06/15/2016   CL 102 06/15/2016   CREATININE 0.81 06/15/2016   BUN 11 06/15/2016   CO2 30 06/15/2016   TSH 1.05 04/16/2015   INR 0.93 10/08/2013   HGBA1C 6.3 10/27/2016   Wt Readings from Last 3 Encounters:  11/09/16 247 lb (112 kg)  11/09/16 247 lb (112 kg)  10/27/16 245 lb 12.8 oz (111.5 kg)    ASSESSMENT AND PLAN:  Discussed the following assessment and plan:  Dry eye syndrome, unspecified laterality - Plan: ANA,IFA Sjogrens' Pnl rflx Tit/Patn, C-reactive protein, Ambulatory referral to Rheumatology  Dry mouth - Plan: ANA,IFA Sjogrens' Pnl rflx Tit/Patn, C-reactive protein, Ambulatory referral to Rheumatology  Arthritis of hand - Plan: ANA,IFA Sjogrens' Pnl rflx Tit/Patn, C-reactive protein, Ambulatory referral to Rheumatology Reasonable to screen for Sjogren's antibodies but with her history of hand and knee large joint small joint arthritis which may be degenerative consider inflammatory arthritis or other causes. Plan blood work today and rheumatology consult patient agrees. She reports to me that she cannot take pain or  anti-inflammatory medicines pain meds .  -Patient advised to return or notify health care team  if symptoms worsen ,persist or new concerns arise.  Patient Instructions  Checking some antibody blood test to see if you could have Sjogren's or other autoimmune  issues. Because of your hand and wrist joint pain would refer to rheumatology either way as to an opinion as to the cause of your arthritis. Sometimes there is an inflammatory arthritis in addition to osteoarthritis that may be treated differently. Someone will contact you about the lab results and a rheumatology consult.      [name]. Kataleyah Carducci M.D.

## 2016-11-09 NOTE — Patient Instructions (Signed)
Checking some antibody blood test to see if you could have Sjogren's or other autoimmune issues. Because of your hand and wrist joint pain would refer to rheumatology either way as to an opinion as to the cause of your arthritis. Sometimes there is an inflammatory arthritis in addition to osteoarthritis that may be treated differently. Someone will contact you about the lab results and a rheumatology consult.

## 2016-11-09 NOTE — Patient Instructions (Addendum)
Anna Gamble , Thank you for taking time to come for your Medicare Wellness Visit. I appreciate your ongoing commitment to your health goals. Please review the following plan we discussed and let me know if I can assist you in the future.   Calcium  is in all of our foods  But you do need 1200 mg per day and 1000 Vit d if you are not in sun  [facility] [place] - can assist with hearing aid x 1  No reviews  [address] Office  [place] [address]  [phone]  Will see if Dr. Regis Bill wants to refer to gyn for pap Also will fup with Dr. Regis Bill sjogren's test   Shingrix is a vaccine for the prevention of Shingles in Adults 79 and older.  If you are on Medicare, you can request a prescription from your doctor to be filled at a pharmacy.  Please check with your benefits regarding applicable copays or out of pocket expenses.  The Shingrix is given in 2 vaccines approx 8 weeks apart. You must receive the 2nd dose prior to 6 months from receipt of the first.       These are the goals we discussed: Goals    . patient          Wants to have more energy Wants to be more active and feels this would help her weight loss; feels you could get 25 lbs off [facility]  Will get on recumbent bike at home around lunch time Will start doing a weight plan; light weight; low as tolerated          This is a list of the screening recommended for you and due dates:  Health Maintenance  Topic Date Due  . Mammogram  09/16/2017  . Tetanus Vaccine  01/01/2020  . Flu Shot  Completed  . DEXA scan (bone density measurement)  Completed  . Pneumonia vaccines  Completed        Fat and Cholesterol Restricted Diet Getting too much fat and cholesterol in your diet may cause health problems. Following this diet helps keep your fat and cholesterol at normal levels. This can keep you from getting sick. What types of fat should I  choose?  Choose monosaturated and polyunsaturated fats. These are found in foods such as olive oil, canola oil, flaxseeds, walnuts, almonds, and seeds.  Eat more omega-3 fats. Good choices include salmon, mackerel, sardines, tuna, flaxseed oil, and ground flaxseeds.  Limit saturated fats. These are in animal products such as meats, butter, and cream. They can also be in plant products such as palm oil, palm kernel oil, and coconut oil.  Avoid foods with partially hydrogenated oils in them. These contain trans fats. Examples of foods that have trans fats are stick margarine, some tub margarines, cookies, crackers, and other baked goods. What general guidelines do I need to follow?  Check food labels. Look for the words "trans fat" and "saturated fat."  When preparing a meal: ? Fill half of your plate with vegetables and green salads. ? Fill one fourth of your plate with whole grains. Look for the word "whole" as the first word in the ingredient list. ? Fill one fourth of your plate with lean protein foods.  Eat more foods that have fiber, like apples, carrots, beans, peas, and barley.  Eat more home-cooked foods. Eat less at restaurants and buffets.  Limit  or avoid alcohol.  Limit foods high in starch and sugar.  Limit fried foods.  Cook foods without frying them. Baking, boiling, grilling, and broiling are all great options.  Lose weight if you are overweight. Losing even a small amount of weight can help your overall health. It can also help prevent diseases such as diabetes and heart disease. What foods can I eat? Grains Whole grains, such as whole wheat or whole grain breads, crackers, cereals, and pasta. Unsweetened oatmeal, bulgur, barley, quinoa, or brown rice. Corn or whole wheat flour tortillas. Vegetables Fresh or frozen vegetables (raw, steamed, roasted, or grilled). Green salads. Fruits All fresh, canned (in natural juice), or frozen fruits. Meat and Other Protein  Products Ground beef (85% or leaner), grass-fed beef, or beef trimmed of fat. Skinless chicken or [place]. Ground chicken or [place]. Pork trimmed of fat. All fish and seafood. Eggs. Dried beans, peas, or lentils. Unsalted nuts or seeds. Unsalted canned or dry beans. Dairy Low-fat dairy products, such as skim or 1% milk, 2% or reduced-fat cheeses, low-fat ricotta or cottage cheese, or plain low-fat yogurt. Fats and Oils Tub margarines without trans fats. Light or reduced-fat mayonnaise and salad dressings. Avocado. Olive, canola, sesame, or safflower oils. Natural peanut or almond butter (choose ones without added sugar and oil). The items listed above may not be a complete list of recommended foods or beverages. Contact your dietitian for more options. What foods are not recommended? Grains White bread. White pasta. White rice. Cornbread. Bagels, pastries, and croissants. Crackers that contain trans fat. Vegetables White potatoes. Corn. Creamed or fried vegetables. Vegetables in a cheese sauce. Fruits Dried fruits. Canned fruit in light or heavy syrup. Fruit juice. Meat and Other Protein Products Fatty cuts of meat. Ribs, chicken wings, bacon, sausage, bologna, salami, chitterlings, fatback, hot dogs, bratwurst, and packaged luncheon meats. Liver and organ meats. Dairy Whole or 2% milk, cream, half-and-half, and cream cheese. Whole milk cheeses. Whole-fat or sweetened yogurt. Full-fat cheeses. Nondairy creamers and whipped toppings. Processed cheese, cheese spreads, or cheese curds. Sweets and Desserts Corn syrup, sugars, honey, and molasses. Candy. Jam and jelly. Syrup. Sweetened cereals. Cookies, pies, cakes, donuts, muffins, and ice cream. Fats and Oils Butter, stick margarine, lard, shortening, ghee, or bacon fat. Coconut, palm kernel, or palm oils. Beverages Alcohol. Sweetened drinks (such as sodas, lemonade, and fruit drinks or punches). The items listed above may not be a complete list  of foods and beverages to avoid. Contact your dietitian for more information. This information is not intended to replace advice given to you by your health care provider. Make sure you discuss any questions you have with your health care provider. Document Released: 07/06/2011 Document Revised: 09/11/2015 Document Reviewed: 04/05/2013 Elsevier Interactive Patient Education  2018 [place] in the Home Falls can cause injuries. They can happen to people of all ages. There are many things you can do to make your home safe and to help prevent falls. What can I do on the outside of my home?  Regularly fix the edges of walkways and driveways and fix any cracks.  Remove anything that might make you trip as you walk through a door, such as a raised step or threshold.  Trim any bushes or trees on the path to your home.  Use bright outdoor lighting.  Clear any walking paths of anything that might make someone trip, such as rocks or tools.  Regularly check to see if handrails are loose or broken.  Make sure that both sides of any steps have handrails.  Any raised decks and porches should have guardrails on the edges.  Have any leaves, snow, or ice cleared regularly.  Use sand or salt on walking paths during winter.  Clean up any spills in your garage right away. This includes oil or grease spills. What can I do in the bathroom?  Use night lights.  Install grab bars by the toilet and in the tub and shower. Do not use towel bars as grab bars.  Use non-skid mats or decals in the tub or shower.  If you need to sit down in the shower, use a plastic, non-slip stool.  Keep the floor dry. Clean up any water that spills on the floor as soon as it happens.  Remove soap buildup in the tub or shower regularly.  Attach bath mats securely with double-sided non-slip rug tape.  Do not have throw rugs and other things on the floor that can make you trip. What can I do in the  bedroom?  Use night lights.  Make sure that you have a light by your bed that is easy to reach.  Do not use any sheets or blankets that are too big for your bed. They should not hang down onto the floor.  Have a firm chair that has side arms. You can use this for support while you get dressed.  Do not have throw rugs and other things on the floor that can make you trip. What can I do in the kitchen?  Clean up any spills right away.  Avoid walking on wet floors.  Keep items that you use a lot in easy-to-reach places.  If you need to reach something above you, use a strong step stool that has a grab bar.  Keep electrical cords out of the way.  Do not use floor polish or wax that makes floors slippery. If you must use wax, use non-skid floor wax.  Do not have throw rugs and other things on the floor that can make you trip. What can I do with my stairs?  Do not leave any items on the stairs.  Make sure that there are handrails on both sides of the stairs and use them. Fix handrails that are broken or loose. Make sure that handrails are as long as the stairways.  Check any carpeting to make sure that it is firmly attached to the stairs. Fix any carpet that is loose or worn.  Avoid having throw rugs at the top or bottom of the stairs. If you do have throw rugs, attach them to the floor with carpet tape.  Make sure that you have a light switch at the top of the stairs and the bottom of the stairs. If you do not have them, ask someone to add them for you. What else can I do to help prevent falls?  Wear shoes that: ? Do not have high heels. ? Have rubber bottoms. ? Are comfortable and fit you well. ? Are closed at the toe. Do not wear sandals.  If you use a stepladder: ? Make sure that it is fully opened. Do not climb a closed stepladder. ? Make sure that both sides of the stepladder are locked into place. ? Ask someone to hold it for you, if possible.  Clearly mark and make  sure that you can see: ? Any grab bars or handrails. ? First and last steps. ? Where the edge of each step is.  Use tools that help you move around (mobility aids) if they are needed. These include: ? Canes. ? Walkers. ? Scooters. ? Crutches.  Turn on the lights when you go into a dark area. Replace any light bulbs as soon as they burn out.  Set up your furniture so you have a clear path. Avoid moving your furniture around.  If any of your floors are uneven, fix them.  If there are any pets around you, be aware of where they are.  Review your medicines with your doctor. Some medicines can make you feel dizzy. This can increase your chance of falling. Ask your doctor what other things that you can do to help prevent falls. This information is not intended to replace advice given to you by your health care provider. Make sure you discuss any questions you have with your health care provider. Document Released: 10/31/2008 Document Revised: 06/12/2015 Document Reviewed: 02/08/2014 Elsevier Interactive Patient Education  2018 [place] Maintenance, Female Adopting a healthy lifestyle and getting preventive care can go a long way to promote health and wellness. Talk with your health care provider about what schedule of regular examinations is right for you. This is a good chance for you to check in with your provider about disease prevention and staying healthy. In between checkups, there are plenty of things you can do on your own. Experts have done a lot of research about which lifestyle changes and preventive measures are most likely to keep you healthy. Ask your health care provider for more information. Weight and diet Eat a healthy diet  Be sure to include plenty of vegetables, fruits, low-fat dairy products, and lean protein.  Do not eat a lot of foods high in solid fats, added sugars, or salt.  Get regular exercise. This is one of the most important things you can do  for your health. ? Most adults should exercise for at least 150 minutes each week. The exercise should increase your heart rate and make you sweat (moderate-intensity exercise). ? Most adults should also do strengthening exercises at least twice a week. This is in addition to the moderate-intensity exercise.  Maintain a healthy weight  Body mass index (BMI) is a measurement that can be used to identify possible weight problems. It estimates body fat based on height and weight. Your health care provider can help determine your BMI and help you achieve or maintain a healthy weight.  For females 72 years of age and older: ? A BMI below 18.5 is considered underweight. ? A BMI of 18.5 to 24.9 is normal. ? A BMI of 25 to 29.9 is considered overweight. ? A BMI of 30 and above is considered obese.  Watch levels of cholesterol and blood lipids  You should start having your blood tested for lipids and cholesterol at 79 years of age, then have this test every 5 years.  You may need to have your cholesterol levels checked more often if: ? Your lipid or cholesterol levels are high. ? You are older than 79 years of age. ? You are at high risk for heart disease.  Cancer screening Lung Cancer  Lung cancer screening is recommended for adults 51-43 years old who are at high risk for lung cancer because of a history of smoking.  A yearly low-dose CT scan of the lungs is recommended for people who: ? Currently smoke. ? Have quit within the past 15 years. ? Have at least a 30-pack-year history of smoking. A  pack year is smoking an average of one pack of cigarettes a day for 1 year.  Yearly screening should continue until it has been 15 years since you quit.  Yearly screening should stop if you develop a health problem that would prevent you from having lung cancer treatment.  Breast Cancer  Practice breast self-awareness. This means understanding how your breasts normally appear and feel.  It  also means doing regular breast self-exams. Let your health care provider know about any changes, no matter how small.  If you are in your 20s or 30s, you should have a clinical breast exam (CBE) by a health care provider every 1-3 years as part of a regular health exam.  If you are 17 or older, have a CBE every year. Also consider having a breast X-ray (mammogram) every year.  If you have a family history of breast cancer, talk to your health care provider about genetic screening.  If you are at high risk for breast cancer, talk to your health care provider about having an MRI and a mammogram every year.  Breast cancer gene (BRCA) assessment is recommended for women who have family members with BRCA-related cancers. BRCA-related cancers include: ? Breast. ? Ovarian. ? Tubal. ? Peritoneal cancers.  Results of the assessment will determine the need for genetic counseling and BRCA1 and BRCA2 testing.  Cervical Cancer Your health care provider may recommend that you be screened regularly for cancer of the pelvic organs (ovaries, uterus, and vagina). This screening involves a pelvic examination, including checking for microscopic changes to the surface of your cervix (Pap test). You may be encouraged to have this screening done every 3 years, beginning at age 45.  For women ages 36-65, health care providers may recommend pelvic exams and Pap testing every 3 years, or they may recommend the Pap and pelvic exam, combined with testing for human papilloma virus (HPV), every 5 years. Some types of HPV increase your risk of cervical cancer. Testing for HPV may also be done on women of any age with unclear Pap test results.  Other health care providers may not recommend any screening for nonpregnant women who are considered low risk for pelvic cancer and who do not have symptoms. Ask your health care provider if a screening pelvic exam is right for you.  If you have had past treatment for cervical  cancer or a condition that could lead to cancer, you need Pap tests and screening for cancer for at least 20 years after your treatment. If Pap tests have been discontinued, your risk factors (such as having a new sexual partner) need to be reassessed to determine if screening should resume. Some women have medical problems that increase the chance of getting cervical cancer. In these cases, your health care provider may recommend more frequent screening and Pap tests.  Colorectal Cancer  This type of cancer can be detected and often prevented.  Routine colorectal cancer screening usually begins at 79 years of age and continues through 79 years of age.  Your health care provider may recommend screening at an earlier age if you have risk factors for colon cancer.  Your health care provider may also recommend using home test kits to check for hidden blood in the stool.  A small camera at the end of a tube can be used to examine your colon directly (sigmoidoscopy or colonoscopy). This is done to check for the earliest forms of colorectal cancer.  Routine screening usually begins at age 57.  Direct examination of the colon should be repeated every 5-10 years through 79 years of age. However, you may need to be screened more often if early forms of precancerous polyps or small growths are found.  Skin Cancer  Check your skin from head to toe regularly.  Tell your health care provider about any new moles or changes in moles, especially if there is a change in a mole's shape or color.  Also tell your health care provider if you have a mole that is larger than the size of a pencil eraser.  Always use sunscreen. Apply sunscreen liberally and repeatedly throughout the day.  Protect yourself by wearing long sleeves, pants, a wide-brimmed hat, and sunglasses whenever you are outside.  Heart disease, diabetes, and high blood pressure  High blood pressure causes heart disease and increases the risk  of stroke. High blood pressure is more likely to develop in: ? People who have blood pressure in the high end of the normal range (130-139/85-89 mm Hg). ? People who are overweight or obese. ? People who are African American.  If you are 34-59 years of age, have your blood pressure checked every 3-5 years. If you are 68 years of age or older, have your blood pressure checked every year. You should have your blood pressure measured twice-once when you are at a hospital or clinic, and once when you are not at a hospital or clinic. Record the average of the two measurements. To check your blood pressure when you are not at a hospital or clinic, you can use: ? An automated blood pressure machine at a pharmacy. ? A home blood pressure monitor.  If you are between 62 years and 10 years old, ask your health care provider if you should take aspirin to prevent strokes.  Have regular diabetes screenings. This involves taking a blood sample to check your fasting blood sugar level. ? If you are at a normal weight and have a low risk for diabetes, have this test once every three years after 79 years of age. ? If you are overweight and have a high risk for diabetes, consider being tested at a younger age or more often. Preventing infection Hepatitis B  If you have a higher risk for hepatitis B, you should be screened for this virus. You are considered at high risk for hepatitis B if: ? You were born in a country where hepatitis B is common. Ask your health care provider which countries are considered high risk. ? Your parents were born in a high-risk country, and you have not been immunized against hepatitis B (hepatitis B vaccine). ? You have HIV or AIDS. ? You use needles to inject street drugs. ? You live with someone who has hepatitis B. ? You have had sex with someone who has hepatitis B. ? You get hemodialysis treatment. ? You take certain medicines for conditions, including cancer, organ  transplantation, and autoimmune conditions.  Hepatitis C  Blood testing is recommended for: ? Everyone born from [birth year] through 1963. ? Anyone with known risk factors for hepatitis C.  Sexually transmitted infections (STIs)  You should be screened for sexually transmitted infections (STIs) including gonorrhea and chlamydia if: ? You are sexually active and are younger than 79 years of age. ? You are older than 79 years of age and your health care provider tells you that you are at risk for this type of infection. ? Your sexual activity has changed since you were last screened and  you are at an increased risk for chlamydia or gonorrhea. Ask your health care provider if you are at risk.  If you do not have HIV, but are at risk, it may be recommended that you take a prescription medicine daily to prevent HIV infection. This is called pre-exposure prophylaxis (PrEP). You are considered at risk if: ? You are sexually active and do not regularly use condoms or know the HIV status of your partner(s). ? You take drugs by injection. ? You are sexually active with a partner who has HIV.  Talk with your health care provider about whether you are at high risk of being infected with HIV. If you choose to begin PrEP, you should first be tested for HIV. You should then be tested every 3 months for as long as you are taking PrEP. Pregnancy  If you are premenopausal and you may become pregnant, ask your health care provider about preconception counseling.  If you may become pregnant, take 400 to 800 micrograms (mcg) of folic acid every day.  If you want to prevent pregnancy, talk to your health care provider about birth control (contraception). Osteoporosis and menopause  Osteoporosis is a disease in which the bones lose minerals and strength with aging. This can result in serious bone fractures. Your risk for osteoporosis can be identified using a bone density scan.  If you are 53 years of age or  older, or if you are at risk for osteoporosis and fractures, ask your health care provider if you should be screened.  Ask your health care provider whether you should take a calcium or vitamin D supplement to lower your risk for osteoporosis.  Menopause may have certain physical symptoms and risks.  Hormone replacement therapy may reduce some of these symptoms and risks. Talk to your health care provider about whether hormone replacement therapy is right for you. Follow these instructions at home:  Schedule regular health, dental, and eye exams.  Stay current with your immunizations.  Do not use any tobacco products including cigarettes, chewing tobacco, or electronic cigarettes.  If you are pregnant, do not drink alcohol.  If you are breastfeeding, limit how much and how often you drink alcohol.  Limit alcohol intake to no more than 1 drink per day for nonpregnant women. One drink equals 12 ounces of beer, 5 ounces of wine, or 1 ounces of hard liquor.  Do not use street drugs.  Do not share needles.  Ask your health care provider for help if you need support or information about quitting drugs.  Tell your health care provider if you often feel depressed.  Tell your health care provider if you have ever been abused or do not feel safe at home. This information is not intended to replace advice given to you by your health care provider. Make sure you discuss any questions you have with your health care provider. Document Released: 07/20/2010 Document Revised: 06/12/2015 Document Reviewed: 10/08/2014 Elsevier Interactive Patient Education  [employer].

## 2016-11-10 LAB — ANTI-NUCLEAR AB-TITER (ANA TITER): ANA Titer 1: 1:40 {titer} — ABNORMAL HIGH

## 2016-11-10 LAB — ANA,IFA SJOGRENS' PNL RFLX TIT/PATN
ANA: POSITIVE — AB
Rheumatoid fact SerPl-aCnc: <14 IU/mL (ref <14)
SSA (Ro) (ENA) Antibody, IgG: <1 AI
SSB (La) (ENA) Antibody, IgG: <1 AI

## 2016-11-22 ENCOUNTER — Telehealth: Payer: Self-pay | Admitting: Cardiology

## 2016-11-22 NOTE — Telephone Encounter (Signed)
Pt have been having SOB,Heaviness in her Chest and weakness,She has an appointment for Thurdsay,Pt could not come tomorrow,Please call to evaluate.

## 2016-11-22 NOTE — Telephone Encounter (Signed)
Returned the call to the patient. She stated that for the past week she has had shortness of breath when doing activities and at times she has a burning sensation in her throat. She did state that she has a history of GERD and wonders if that is what the burning is from. She has an appointment with Almyra Deforest, [place] on 11/8. She had been instructed if she gets worse and the tightness develops in the chest area then she should go to the ED. She verbalized her understanding.

## 2016-11-24 ENCOUNTER — Encounter: Payer: Self-pay | Admitting: *Deleted

## 2016-11-25 ENCOUNTER — Encounter: Payer: Self-pay | Admitting: Physician Assistant

## 2016-11-25 ENCOUNTER — Ambulatory Visit (INDEPENDENT_AMBULATORY_CARE_PROVIDER_SITE_OTHER): Payer: Medicare Other | Admitting: Physician Assistant

## 2016-11-25 VITALS — BP 132/90 | HR 76 | Ht 62.0 in | Wt 247.0 lb

## 2016-11-25 DIAGNOSIS — R0602 Shortness of breath: Secondary | ICD-10-CM

## 2016-11-25 DIAGNOSIS — R0789 Other chest pain: Secondary | ICD-10-CM | POA: Diagnosis not present

## 2016-11-25 DIAGNOSIS — I10 Essential (primary) hypertension: Secondary | ICD-10-CM

## 2016-11-25 DIAGNOSIS — I25119 Atherosclerotic heart disease of native coronary artery with unspecified angina pectoris: Secondary | ICD-10-CM | POA: Diagnosis not present

## 2016-11-25 DIAGNOSIS — E785 Hyperlipidemia, unspecified: Secondary | ICD-10-CM | POA: Diagnosis not present

## 2016-11-25 DIAGNOSIS — I350 Nonrheumatic aortic (valve) stenosis: Secondary | ICD-10-CM

## 2016-11-25 NOTE — Patient Instructions (Signed)
Medication Instructions:  Your physician recommends that you continue on your current medications as directed. Please refer to the Current Medication list given to you today.  Labwork: Your physician recommends that you return for lab work in: TODAY-D-DIMER, BNP, CBC  Testing/Procedures: Your physician has requested that you have a lexiscan myoview. For further information please visit [email or web address]. Please follow instruction sheet, as given.  Follow-Up: Your physician recommends that you schedule a follow-up appointment in: 3-4 WEEKS WITH HAO MENG ON A DAY DR CRENSHAW IS IN THE OFFICE  Any Other Special Instructions Will Be Listed Below (If Applicable).  If you need a refill on your cardiac medications before your next appointment, please call your pharmacy.

## 2016-11-25 NOTE — Progress Notes (Signed)
Cardiology Office Note    Date:  11/25/2016   ID:  Anna Gamble, DOB 05/24/1935, MRN [id number]  PCP:  Burnis Medin, MD  Cardiologist:   [name]  Chief Complaint  Patient presents with  . Follow-up    Having a burning feeling in her chest, unsure if its heart related or gerd, having some severe shortness of breath    History of Present Illness:  Anna Gamble is a 81 y.o. female with PMH of HTN, HLD, CAD and aortic stenosis. She has a history of PVCs controlled on beta blocker. Nuclear study obtained in September 2015 showed anterior ischemia, shifting breast attenuation not excluded; EF 67%. Echocardiogram obtained on 09/2013 showed normal LV function, mild AS with mean gradient of 15 mmHg, mild LAE, mild MR. Cardiac catheterization performed on 09/2013 showed a 20% left main, no other obstructive disease. Ejection fraction 60-65%, mild to moderate aortic stenosis. Her last office visit was on 04/13/2015, she does have some mild dyspnea on exertion which has been unchanged. I last saw the patient in April, she was having some baseline dyspnea with exertion which is chronic for her. I repeated her echocardiogram on 05/17/2016 which showed EF 55-60%, grade 1 DD, mild MR, moderate AS. One year follow-up was recommended.   Patient presents today for cardiology office evaluation. She has chronic dyspnea baseline, however for the past 2 weeks, she has been noticing worsening dyspnea along with a burning sensation in the chest. She mainly noticed a burning sensation at rest, however it is slightly worse with exertion. I plan to proceed with a 2 day [place]ie. Given her body size and weight, she will need a 2 day study. As for her moderate aortic stenosis, if it worsens, it can certainly cause her symptom. I wish to proceed by obtaining a proBNP to see if it's elevated. I will also obtain a d-dimer and a CBC to rule out secondary causes for shortness of breath as well. Also otherwise  to her EKG is unchanged.   Past Medical History:  Diagnosis Date  . Aortic stenosis   . Colon polyp   . Coronary artery disease    aortic stenosis/mild per ECHO  . Dyspnea   . Generalized osteoarthrosis, involving multiple sites   . GERD (gastroesophageal reflux disease)    dilitation  . Hyperlipidemia   . Hypertension   . Impaired fasting glucose   . Obesity   . Personal history of other diseases of digestive system   . PONV (postoperative nausea and vomiting)     Past Surgical History:  Procedure Laterality Date  . APPENDECTOMY    . CHOLECYSTECTOMY    . FOOT SURGERY     rt  . KNEE SURGERY     rt  . TONSILLECTOMY      Current Medications: Outpatient Medications Prior to Visit  Medication Sig Dispense Refill  . albuterol (PROVENTIL HFA;VENTOLIN HFA) 108 (90 Base) MCG/ACT inhaler Inhale 2 puffs into the lungs every 4 (four) hours as needed for wheezing or shortness of breath. 1 Inhaler 1  . aspirin 81 MG tablet Take 81 mg by mouth daily.    Marland Kitchen CALCIUM PO Take 1 tablet by mouth daily.      . cholecalciferol (VITAMIN D) 1000 UNITS tablet Take 1,000 Units by mouth daily.      Marland Kitchen losartan-hydrochlorothiazide (HYZAAR) 50-12.5 MG tablet Take 1 tablet by mouth daily. 90 tablet 3  . metoprolol succinate (TOPROL-XL) 50 MG 24 hr tablet  Take 1 tablet (50 mg total) by mouth daily. 90 tablet 3  . multivitamin (THERAGRAN) per tablet Take 1 tablet by mouth daily.     Marland Kitchen omeprazole-sodium bicarbonate (ZEGERID) 40-1100 MG per capsule Take 1 capsule by mouth daily. 1 capsule daily    . rosuvastatin (CRESTOR) 20 MG tablet Take 20 mg by mouth daily.    . vitamin E 400 UNIT capsule Take 400 Units by mouth daily.    . Omega-3 1000 MG CAPS Take 1 capsule by mouth 2 (two) times daily.     No facility-administered medications prior to visit.      Allergies:   Lisinopril and Statins   Social History   Socioeconomic History  . Marital status: Married    Spouse name: None  . Number of  children: None  . Years of education: None  . Highest education level: None  Social Needs  . Financial resource strain: None  . Food insecurity - worry: None  . Food insecurity - inability: None  . Transportation needs - medical: None  . Transportation needs - non-medical: None  Occupational History  . 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 Smoking status: Never Smoker  . Smokeless tobacco: Never Used  . Tobacco comment: as a teenager  Substance and Sexual Activity  . Alcohol use: No  . Drug use: No  . Sexual activity: None    Comment: quit 40 yrs ago  Other Topics Concern  . None  Social History Narrative   hh of 3 -4  Married   [place] son   At home    Invalid son paraplegia and husband    And grandson recently     No pets   Fluctuating hh membors she cooks and prepares for them            Family History:  The patient's family history includes Coronary artery disease in her unknown relative and unknown relative; Heart attack in her father and mother; Hyperlipidemia in her unknown relative; Hypertension in her unknown relative; Other in her son; Sleep apnea in her son; Stroke in her brother.   ROS:   Please see the history of present illness.    ROS All other systems reviewed and are negative.   PHYSICAL EXAM:   VS:  BP 132/90   Pulse 76   Ht 5\' 2"  (1.575 m)   Wt 247 lb (112 kg)   BMI 45.18 kg/m    GEN: Well nourished, well developed, in no acute distress  HEENT: normal  Neck: no JVD, carotid bruits, or masses Cardiac: RRR; no rubs, or gallops,no edema  +0/1 systolic murmur Respiratory:  clear to auscultation bilaterally, normal work of breathing GI: soft, nontender, nondistended, + BS MS: no deformity or atrophy  Skin: warm and dry, no rash Neuro:  Alert and Oriented x 3, Strength and sensation are intact Psych: euthymic mood, full affect  Wt Readings from Last 3 Encounters:  11/25/16 247 lb (112 kg)  11/09/16 247 lb  (112 kg)  11/09/16 247 lb (112 kg)      Studies/Labs Reviewed:   EKG:  EKG is ordered today.  The ekg ordered today demonstrates normal sinus rhythm without significant ST-T wave changes.  Recent Labs: 06/15/2016: ALT 22; BUN 11; Creatinine, Ser 0.81; Potassium 4.5; Sodium 140 11/25/2016: Hemoglobin WILL FOLLOW; NT-Pro BNP 122; Platelets WILL FOLLOW   Lipid Panel    Component Value Date/Time   CHOL 182  06/15/2016 1033   TRIG 248.0 (H) 06/15/2016 1033   HDL 54.90 06/15/2016 1033   CHOLHDL 3 06/15/2016 1033   VLDL 49.6 (H) 06/15/2016 1033   LDLCALC 77 06/11/2015 0820   LDLDIRECT 89.0 06/15/2016 1033    Additional studies/ records that were reviewed today include:   Cath 10/10/2013 Hemodynamic Findings: Ao: 119/54               LV: 124/9/18 RA: 10                RV: 33/11/12 PA: 36/7 (mean 24)       PCWP: 14 Fick Cardiac Output: 7.2 L/min Fick Cardiac Index: 3.4 L/min/m2 Central Aortic Saturation: 99% Pulmonary Artery Saturation: 77%  Angiographic Findings:  Left main: 20% diffuse stenosis  Left Anterior Descending Artery:  Moderate caliber vessel that does not reach the apex. Small caliber diagonal branch. No obstructive disease.   Circumflex Artery: Large caliber vessel with two moderate caliber obtuse marginal branches. No obstructive disease.   Right Coronary Artery: Large caliber dominant vessel with no obstructive disease.   Left Ventricular Angiogram: LVEF=60-65%.   Impression: 1. Mild non-obstructive CAD.  2. Normal LV function 3. Aortic stenosis, mild to moderate with small gradient and valve very easy to cross.     Echo 05/17/2016 LV EF: 55% -   60%  Study Conclusions  - Left ventricle: The cavity size was normal. Wall thickness was   increased in a pattern of mild LVH. Systolic function was normal.   The estimated ejection fraction was in the range of 55% to 60%.   Wall motion was normal; there were no regional wall motion    abnormalities. Doppler parameters are consistent with abnormal   left ventricular relaxation (grade 1 diastolic dysfunction). - Aortic valve: Valve mobility was restricted. There was moderate   stenosis. - Mitral valve: Calcified annulus. There was mild regurgitation. - Left atrium: The atrium was mildly dilated. - Pulmonary arteries: Systolic pressure was mildly increased. PA   peak pressure: 33 mm Hg (S).  Impressions:  - Normal LV systolic function; mild diastolic dysfunction;   calcified aortic valve with moderate AS (mean gradient 27 mmHg);   mild MR; mild LAE; mild TR with mildly elevated pulmonary   pressure.   ASSESSMENT:    1. Burning chest pain   2. Increasing shortness of breath   3. Essential hypertension   4. Hyperlipidemia, unspecified hyperlipidemia type   5. Coronary artery disease involving native coronary artery of native heart with angina pectoris ([place])   6. Moderate aortic stenosis      PLAN:  In order of problems listed above:  1. Burning sensation in the chest: Noticeable both at rest and with exertion. Previous cardiac catheterization in September 2015 only showed 20% left main disease. Will obtain 2 day Myoview for further assessment.  2. Increasing dyspnea with exertion: Continued to have 3/6 systolic murmur on physical exam. She has chronic baseline dyspnea on exertion. I obtain echocardiogram in April 2018 which only showed moderate aortic stenosis. We will obtain proBNP, d-dimer and a CBC to rule out secondary causes.  3. Moderate aortic stenosis: Stable on previous echocardiogram in April 2018  4. CAD: Minimal disease in left main on previous cardiac catheterization in 2015  5. Hypertension: blood pressure stable  6. Hyperlipidemia: continue Crestor 20 mg daily.    Medication Adjustments/Labs and Tests Ordered: Current medicines are reviewed at length with the patient today.  Concerns regarding medicines are outlined above.  Medication  changes, Labs and Tests ordered today are listed in the Patient Instructions below. Patient Instructions  Medication Instructions:  Your physician recommends that you continue on your current medications as directed. Please refer to the Current Medication list given to you today.  Labwork: Your physician recommends that you return for lab work in: TODAY-D-DIMER, BNP, CBC  Testing/Procedures: Your physician has requested that you have a lexiscan myoview. For further information please visit [email or web address]. Please follow instruction sheet, as given.  Follow-Up: Your physician recommends that you schedule a follow-up appointment in: 3-4 WEEKS WITH Anna Gamble ON A DAY DR CRENSHAW IS IN THE OFFICE  Any Other Special Instructions Will Be Listed Below (If Applicable).  If you need a refill on your cardiac medications before your next appointment, please call your pharmacy.     Anna Gamble, [place]  11/25/2016 8:56 PM    [facility] [place], [place], [place]  [address] Phone: [phone]; Fax: [phone]

## 2016-11-26 ENCOUNTER — Telehealth: Payer: Self-pay | Admitting: *Deleted

## 2016-11-26 DIAGNOSIS — R0602 Shortness of breath: Secondary | ICD-10-CM

## 2016-11-26 LAB — CBC
HEMOGLOBIN: 12.8 g/dL (ref 11.1–15.9)
Hematocrit: 37.3 % (ref 34.0–46.6)
MCH: 32.5 pg (ref 26.6–33.0)
MCHC: 34.3 g/dL (ref 31.5–35.7)
MCV: 95 fL (ref 79–97)
PLATELETS: 264 10*3/uL (ref 150–379)
RBC: 3.94 x10E6/uL (ref 3.77–5.28)
RDW: 13.5 % (ref 12.3–15.4)
WBC: 6.6 10*3/uL (ref 3.4–10.8)

## 2016-11-26 LAB — PRO B NATRIURETIC PEPTIDE: NT-PRO BNP: 122 pg/mL (ref 0–738)

## 2016-11-26 LAB — D-DIMER, QUANTITATIVE: D-DIMER: 1.27 mg/L FEU — ABNORMAL HIGH (ref 0.00–0.49)

## 2016-11-26 NOTE — Telephone Encounter (Signed)
hao discussed lab results with the patient, CTA scheduled for 11/29/16 @ 1:30 pm at the [address] location. She will come to the [place] office Monday morning for stat BMP.

## 2016-11-26 NOTE — Telephone Encounter (Addendum)
-----   Message from [place], [place] sent at 11/26/2016  1:18 PM EST ----- proBNP normal, will hold off on repeating echo given recent echo showed only moderate aortic stenosis. No sign of anemia. However d-dimer positive, prior to her myoview, she will need a CTA of chest with PE protocol to rule out PE.   Unable to reach pt or leave a message

## 2016-11-26 NOTE — Progress Notes (Signed)
I called both the home phone and mobile phone, unable to reach patient

## 2016-11-29 ENCOUNTER — Ambulatory Visit (INDEPENDENT_AMBULATORY_CARE_PROVIDER_SITE_OTHER)
Admission: RE | Admit: 2016-11-29 | Discharge: 2016-11-29 | Disposition: A | Discharge status: Home / Self Care | Payer: Medicare Other | Source: Ambulatory Visit | Attending: Physician Assistant | Admitting: Physician Assistant

## 2016-11-29 ENCOUNTER — Telehealth: Payer: Self-pay

## 2016-11-29 DIAGNOSIS — R0602 Shortness of breath: Secondary | ICD-10-CM

## 2016-11-29 LAB — BASIC METABOLIC PANEL
BUN/Creatinine Ratio: 20 (ref 12–28)
BUN: 16 mg/dL (ref 8–27)
CALCIUM: 9.8 mg/dL (ref 8.7–10.3)
CHLORIDE: 102 mmol/L (ref 96–106)
CO2: 31 mmol/L — ABNORMAL HIGH (ref 20–29)
Creatinine, Ser: 0.81 mg/dL (ref 0.57–1.00)
GFR calc non Af Amer: 69 mL/min/{1.73_m2} (ref >59)
GFR, EST AFRICAN AMERICAN: 80 mL/min/{1.73_m2} (ref >59)
GLUCOSE: 144 mg/dL — AB (ref 65–99)
POTASSIUM: 4.4 mmol/L (ref 3.5–5.2)
Sodium: 141 mmol/L (ref 134–144)

## 2016-11-29 MED ORDER — IOPAMIDOL (ISOVUE-370) INJECTION 76%
80.0000 mL | Freq: Once | INTRAVENOUS | Status: AC | PRN
  Administered 2016-11-29: 80 mL via INTRAVENOUS

## 2016-11-29 NOTE — Telephone Encounter (Signed)
Called patient but a gentleman answered the phone and stated she was at a doctor appointment; advised him to have her give the office a call. He voiced understanding.

## 2016-11-29 NOTE — Telephone Encounter (Signed)
-----   Message from [place], [place] sent at 11/29/2016  2:40 PM EST ----- No blood clot noted. No fluid in the lung. Some thickening of airway of unclear significance

## 2016-11-29 NOTE — Progress Notes (Signed)
No blood clot noted. No fluid in the lung. Some thickening of airway of unclear significance

## 2016-11-29 NOTE — Telephone Encounter (Signed)
F/u message ° °Pt returning RN call. Please call back to discuss  °

## 2016-11-29 NOTE — Telephone Encounter (Signed)
Normal

## 2016-11-30 ENCOUNTER — Other Ambulatory Visit: Payer: Self-pay | Admitting: Cardiology

## 2016-11-30 ENCOUNTER — Telehealth (HOSPITAL_COMMUNITY): Payer: Self-pay

## 2016-11-30 NOTE — Telephone Encounter (Signed)
Patient informed of lab results and ct angio chest; she voiced understanding.

## 2016-11-30 NOTE — Telephone Encounter (Signed)
Encounter complete. 

## 2016-11-30 NOTE — Telephone Encounter (Signed)
REFILL 

## 2016-12-02 ENCOUNTER — Ambulatory Visit (HOSPITAL_COMMUNITY)
Admission: RE | Admit: 2016-12-02 | Discharge: 2016-12-02 | Disposition: A | Discharge status: Home / Self Care | Payer: Medicare Other | Source: Ambulatory Visit | Attending: Cardiovascular Disease | Admitting: Cardiovascular Disease

## 2016-12-02 DIAGNOSIS — R0789 Other chest pain: Secondary | ICD-10-CM | POA: Diagnosis not present

## 2016-12-02 DIAGNOSIS — R0602 Shortness of breath: Secondary | ICD-10-CM | POA: Insufficient documentation

## 2016-12-02 MED ORDER — REGADENOSON 0.4 MG/5ML IV SOLN
0.4000 mg | Freq: Once | INTRAVENOUS | Status: AC
  Administered 2016-12-02: 0.4 mg via INTRAVENOUS

## 2016-12-02 MED ORDER — TECHNETIUM TC 99M TETROFOSMIN IV KIT
30.6000 | PACK | Freq: Once | INTRAVENOUS | Status: AC | PRN
  Administered 2016-12-02: 30.6 via INTRAVENOUS
  Filled 2016-12-02: qty 31

## 2016-12-03 ENCOUNTER — Ambulatory Visit (HOSPITAL_COMMUNITY)
Admission: RE | Admit: 2016-12-03 | Discharge: 2016-12-03 | Disposition: A | Discharge status: Home / Self Care | Payer: Medicare Other | Source: Ambulatory Visit | Attending: Cardiology | Admitting: Cardiology

## 2016-12-03 LAB — MYOCARDIAL PERFUSION IMAGING
CHL CUP NUCLEAR SDS: 2
CHL CUP RESTING HR STRESS: 67 {beats}/min
LV sys vol: 35 mL
LVDIAVOL: 92 mL (ref 46–106)
Peak HR: 89 {beats}/min
SRS: 0
SSS: 2
TID: 1.08

## 2016-12-03 MED ORDER — TECHNETIUM TC 99M TETROFOSMIN IV KIT
29.6000 | PACK | Freq: Once | INTRAVENOUS | Status: AC | PRN
  Administered 2016-12-03: 29.6 via INTRAVENOUS

## 2016-12-06 NOTE — Progress Notes (Signed)
Stress test shows normal pumping function and normal perfusion.

## 2016-12-27 ENCOUNTER — Ambulatory Visit: Payer: Medicare Other | Admitting: Physician Assistant

## 2016-12-28 NOTE — Progress Notes (Signed)
HPI: FU aortic stenosis. Placed on beta blocker previously secondary to PVCs. Cardiac catheterization September 2015 showed a 20% left main and no other obstructive disease. Ejection fraction 60-65%. Mild to moderate aortic stenosis. Last echo 4/18 showed normal LV systolic function; moderate AS with mean gradient 27 mmHg, mild MR and mild LAE. Nuclear study 11/18 showed EF 62, probable shifting breast attenuation. CTA 11/18 showed no pulmonary embolus. Seen 11/18 with CP and increased dyspnea. Since last seen, patient has some dyspnea on exertion unchanged. No orthopnea or PND.Occasional mild pedal edema. Occasional burning sensation in chest but no exertional chest pain. No syncope.  Current Outpatient Medications  Medication Sig Dispense Refill  . albuterol (PROVENTIL HFA;VENTOLIN HFA) 108 (90 Base) MCG/ACT inhaler Inhale 2 puffs into the lungs every 4 (four) hours as needed for wheezing or shortness of breath. 1 Inhaler 1  . aspirin 81 MG tablet Take 81 mg by mouth daily.    Marland Kitchen CALCIUM PO Take 1 tablet by mouth daily.      . cholecalciferol (VITAMIN D) 1000 UNITS tablet Take 1,000 Units by mouth daily.      Marland Kitchen losartan-hydrochlorothiazide (HYZAAR) 50-12.5 MG tablet Take 1 tablet by mouth daily. 90 tablet 3  . metoprolol succinate (TOPROL-XL) 50 MG 24 hr tablet Take 1 tablet (50 mg total) by mouth daily. 90 tablet 3  . multivitamin (THERAGRAN) per tablet Take 1 tablet by mouth daily.     . Omega-3 1000 MG CAPS Take 1 capsule by mouth 2 (two) times daily.    Marland Kitchen omeprazole-sodium bicarbonate (ZEGERID) 40-1100 MG per capsule Take 1 capsule by mouth daily. 1 capsule daily    . rosuvastatin (CRESTOR) 40 MG tablet TAKE 1 TABLET BY MOUTH  DAILY 90 tablet 2  . vitamin E 400 UNIT capsule Take 400 Units by mouth daily.     No current facility-administered medications for this visit.      Past Medical History:  Diagnosis Date  . Aortic stenosis   . Colon polyp   . Coronary artery disease    aortic stenosis/mild per ECHO  . Dyspnea   . Generalized osteoarthrosis, involving multiple sites   . GERD (gastroesophageal reflux disease)    dilitation  . Hyperlipidemia   . Hypertension   . Impaired fasting glucose   . Obesity   . Personal history of other diseases of digestive system   . PONV (postoperative nausea and vomiting)     Past Surgical History:  Procedure Laterality Date  . APPENDECTOMY    . CHOLECYSTECTOMY    . FOOT SURGERY     rt  . KNEE SURGERY     rt  . LEFT AND RIGHT HEART CATHETERIZATION WITH CORONARY ANGIOGRAM N/A 10/10/2013   Procedure: LEFT AND RIGHT HEART CATHETERIZATION WITH CORONARY ANGIOGRAM;  Surgeon: Burnell Blanks, MD;  Location: [facility] CATH LAB;  Service: Cardiovascular;  Laterality: N/A;  . TONSILLECTOMY    . TOTAL KNEE ARTHROPLASTY  02/01/2011   Procedure: TOTAL KNEE ARTHROPLASTY;  Surgeon: Kerin Salen;  Location: [facility];  Service: Orthopedics;  Laterality: Right;  DEPUY/SIGMA    Social History   Socioeconomic History  . Marital status: Married    Spouse name: Not on file  . Number of children: Not on file  . Years of education: Not on file  . Highest education level: Not on file  Social Needs  . Financial resource strain: Not on file  . Food insecurity - worry: Not on file  .  Food insecurity - inability: Not on file  . Transportation needs - medical: Not on file  . Transportation needs - non-medical: Not on file  Occupational History  . 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 Smoking status: Never Smoker  . Smokeless tobacco: Never Used  . Tobacco comment: as a teenager  Substance and Sexual Activity  . Alcohol use: No  . Drug use: No  . Sexual activity: Not on file    Comment: quit 40 yrs ago  Other Topics Concern  . Not on file  Social History Narrative   hh of 3 -4  Married   [place] son   At home    Invalid son paraplegia and husband    And grandson recently     No pets    Fluctuating hh membors she cooks and prepares for them           Family History  Problem Relation Age of Onset  . Coronary artery disease Unknown        female- 1st degree relative  . Coronary artery disease Unknown        female- 1st degree relative  . Hypertension Unknown   . Hyperlipidemia Unknown   . Heart attack Mother        age 72's  . Heart attack Father        age 42's  . Stroke Brother        died   . Other Son        paraplegia 19  . Sleep apnea Son     ROS: no fevers or chills, productive cough, hemoptysis, dysphasia, odynophagia, melena, hematochezia, dysuria, hematuria, rash, seizure activity, orthopnea, PND, pedal edema, claudication. Remaining systems are negative.  Physical Exam: Well-developed obese in no acute distress.  Skin is warm and dry.  HEENT is normal.  Neck is supple.  Chest is clear to auscultation with normal expansion.  Cardiovascular exam is regular rate and rhythm. 3/6 systolic murmur; S2 diminished Abdominal exam nontender or distended. No masses palpated. Extremities show no edema. neuro grossly intact   A/P  1 aortic stenosis-patient will need follow-up echocardiogram April 2019.  2 dyspnea-this is chronic. Likely multifactorial including obesity hypoventilation syndrome and deconditioning.  3 morbid obesity-we discussed the importance of weight loss.  4 hyperlipidemia-continue statin.  5 coronary artery disease-continue aspirin and statin. Recent nuclear study low risk.  6 hypertension-blood pressure is controlled. Continue present medications.  Kirk Ruths, MD

## 2016-12-29 ENCOUNTER — Encounter: Payer: Self-pay | Admitting: Cardiology

## 2016-12-31 ENCOUNTER — Encounter: Payer: Self-pay | Admitting: Cardiology

## 2016-12-31 ENCOUNTER — Ambulatory Visit (INDEPENDENT_AMBULATORY_CARE_PROVIDER_SITE_OTHER): Payer: Medicare Other | Admitting: Cardiology

## 2016-12-31 VITALS — BP 118/66 | HR 80 | Ht 62.0 in | Wt 248.0 lb

## 2016-12-31 DIAGNOSIS — I2510 Atherosclerotic heart disease of native coronary artery without angina pectoris: Secondary | ICD-10-CM

## 2016-12-31 DIAGNOSIS — I350 Nonrheumatic aortic (valve) stenosis: Secondary | ICD-10-CM

## 2016-12-31 DIAGNOSIS — I2583 Coronary atherosclerosis due to lipid rich plaque: Secondary | ICD-10-CM

## 2016-12-31 DIAGNOSIS — E7800 Pure hypercholesterolemia, unspecified: Secondary | ICD-10-CM | POA: Diagnosis not present

## 2016-12-31 DIAGNOSIS — R0600 Dyspnea, unspecified: Secondary | ICD-10-CM | POA: Diagnosis not present

## 2016-12-31 NOTE — Patient Instructions (Addendum)
Schedule echocardiogram   Your physician recommends that you schedule a follow-up appointment with Dr.Crenshaw  Wednesday 03/23/17 at 9:20 am

## 2017-01-06 ENCOUNTER — Other Ambulatory Visit (HOSPITAL_COMMUNITY): Payer: Medicare Other

## 2017-02-08 DIAGNOSIS — M47816 Spondylosis without myelopathy or radiculopathy, lumbar region: Secondary | ICD-10-CM | POA: Diagnosis not present

## 2017-02-08 DIAGNOSIS — M1712 Unilateral primary osteoarthritis, left knee: Secondary | ICD-10-CM | POA: Diagnosis not present

## 2017-03-15 NOTE — Progress Notes (Signed)
HPI: FU aortic stenosis. Placed on beta blocker previously secondary to PVCs. Cardiac catheterization September 2015 showed a 20% left main and no other obstructive disease. Ejection fraction 60-65%. Mild to moderate aortic stenosis. Last echo 4/18 showed normal LV systolic function; moderate AS with mean gradient 27 mmHg, mild MR and mild LAE. Nuclear study 11/18 showed EF 62, probable shifting breast attenuation. CTA 11/18 showed no pulmonary embolus. Seen 11/18 with CP and increased dyspnea. Since last seen,  she does have dyspnea on exertion unchanged.  No orthopnea, PND or pedal edema.  No chest pain or syncope.  Occasional dizziness with cough.  Current Outpatient Medications  Medication Sig Dispense Refill  . aspirin 81 MG tablet Take 81 mg by mouth daily.    . cholecalciferol (VITAMIN D) 1000 UNITS tablet Take 1,000 Units by mouth daily.      Marland Kitchen losartan-hydrochlorothiazide (HYZAAR) 50-12.5 MG tablet Take 1 tablet by mouth daily. 90 tablet 3  . metoprolol succinate (TOPROL-XL) 50 MG 24 hr tablet Take 1 tablet (50 mg total) by mouth daily. 90 tablet 3  . multivitamin (THERAGRAN) per tablet Take 1 tablet by mouth daily.     . Omega-3 1000 MG CAPS Take 1 capsule by mouth every morning.     Marland Kitchen omeprazole-sodium bicarbonate (ZEGERID) 40-1100 MG per capsule Take 1 capsule by mouth daily. 1 capsule daily    . rosuvastatin (CRESTOR) 40 MG tablet TAKE 1 TABLET BY MOUTH  DAILY 90 tablet 2  . vitamin E 400 UNIT capsule Take 400 Units by mouth daily.    Marland Kitchen albuterol (PROVENTIL HFA;VENTOLIN HFA) 108 (90 Base) MCG/ACT inhaler Inhale 2 puffs into the lungs every 4 (four) hours as needed for wheezing or shortness of breath. 1 Inhaler 1  . CALCIUM PO Take 1 tablet by mouth daily.       No current facility-administered medications for this visit.      Past Medical History:  Diagnosis Date  . Aortic stenosis   . Colon polyp   . Coronary artery disease    aortic stenosis/mild per ECHO  . Dyspnea    . Generalized osteoarthrosis, involving multiple sites   . GERD (gastroesophageal reflux disease)    dilitation  . Hyperlipidemia   . Hypertension   . Impaired fasting glucose   . Obesity   . Personal history of other diseases of digestive system   . PONV (postoperative nausea and vomiting)     Past Surgical History:  Procedure Laterality Date  . APPENDECTOMY    . CHOLECYSTECTOMY    . FOOT SURGERY     rt  . KNEE SURGERY     rt  . LEFT AND RIGHT HEART CATHETERIZATION WITH CORONARY ANGIOGRAM N/A 10/10/2013   Procedure: LEFT AND RIGHT HEART CATHETERIZATION WITH CORONARY ANGIOGRAM;  Surgeon: Burnell Blanks, MD;  Location: [facility] CATH LAB;  Service: Cardiovascular;  Laterality: N/A;  . TONSILLECTOMY    . TOTAL KNEE ARTHROPLASTY  02/01/2011   Procedure: TOTAL KNEE ARTHROPLASTY;  Surgeon: Kerin Salen;  Location: [place];  Service: Orthopedics;  Laterality: Right;  DEPUY/SIGMA    Social History   Socioeconomic History  . Marital status: Married    Spouse name: Not on file  . Number of children: Not on file  . Years of education: Not on file  . Highest education level: Not on file  Social Needs  . Financial resource strain: Not on file  . Food insecurity - worry: Not on file  .  Food insecurity - inability: Not on file  . Transportation needs - medical: Not on file  . Transportation needs - non-medical: Not on file  Occupational History  . 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 Smoking status: Never Smoker  . Smokeless tobacco: Never Used  . Tobacco comment: as a teenager  Substance and Sexual Activity  . Alcohol use: No  . Drug use: No  . Sexual activity: Not on file    Comment: quit 40 yrs ago  Other Topics Concern  . Not on file  Social History Narrative   hh of 3 -4  Married   [place] son   At home    Invalid son paraplegia and husband    And grandson recently     No pets   Fluctuating hh membors she cooks and prepares  for them           Family History  Problem Relation Age of Onset  . Coronary artery disease Unknown        female- 1st degree relative  . Coronary artery disease Unknown        female- 1st degree relative  . Hypertension Unknown   . Hyperlipidemia Unknown   . Heart attack Mother        age 61's  . Heart attack Father        age 2's  . Stroke Brother        died   . Other Son        paraplegia 57  . Sleep apnea Son     ROS: no fevers or chills, productive cough, hemoptysis, dysphasia, odynophagia, melena, hematochezia, dysuria, hematuria, rash, seizure activity, orthopnea, PND, pedal edema, claudication. Remaining systems are negative.  Physical Exam: Well-developed morbidly obese in no acute distress.  Skin is warm and dry.  HEENT is normal.  Neck is supple.  Chest is clear to auscultation with normal expansion.  Cardiovascular exam is regular rate and rhythm. 3/6 systolic murmur LSB; S2 is diminished. Abdominal exam nontender or distended. No masses palpated. Extremities show no edema. neuro grossly intact   A/P  1 aortic stenosis-patient continues to have some dyspnea on exertion.  This has been a chronic issue.  There may be a contribution from aortic stenosis but also from obesity hypoventilation syndrome and deconditioning.  We will plan to repeat echocardiogram.  She understands that she may require aortic valve replacement in the future.  I have again explained the symptoms to be aware of including worsening dyspnea, chest pain and syncope.  2 dyspnea-this is felt to be multifactorial including deconditioning and obesity hypoventilation syndrome.  Possible contribution from aortic stenosis.  3 hypertension-blood pressure is controlled.  Continue present medications.  4 coronary artery disease-continue aspirin and statin.  No recent chest pain.  5 hyperlipidemia-continue statin.  6 morbid obesity-importance of weight loss discussed.  Kirk Ruths, MD

## 2017-03-23 ENCOUNTER — Ambulatory Visit (INDEPENDENT_AMBULATORY_CARE_PROVIDER_SITE_OTHER): Payer: Medicare Other | Admitting: Cardiology

## 2017-03-23 ENCOUNTER — Encounter: Payer: Self-pay | Admitting: Cardiology

## 2017-03-23 VITALS — BP 126/72 | HR 68 | Ht 62.0 in | Wt 247.0 lb

## 2017-03-23 DIAGNOSIS — I10 Essential (primary) hypertension: Secondary | ICD-10-CM | POA: Diagnosis not present

## 2017-03-23 DIAGNOSIS — E7800 Pure hypercholesterolemia, unspecified: Secondary | ICD-10-CM

## 2017-03-23 DIAGNOSIS — I2510 Atherosclerotic heart disease of native coronary artery without angina pectoris: Secondary | ICD-10-CM

## 2017-03-23 DIAGNOSIS — I350 Nonrheumatic aortic (valve) stenosis: Secondary | ICD-10-CM

## 2017-03-23 NOTE — Patient Instructions (Signed)
Your physician wants you to follow-up in: 6 MONTHS WITH DR CRENSHAW You will receive a reminder letter in the mail two months in advance. If you don't receive a letter, please call our office to schedule the follow-up appointment.   If you need a refill on your cardiac medications before your next appointment, please call your pharmacy.  

## 2017-04-06 DIAGNOSIS — D225 Melanocytic nevi of trunk: Secondary | ICD-10-CM | POA: Diagnosis not present

## 2017-04-06 DIAGNOSIS — Z85828 Personal history of other malignant neoplasm of skin: Secondary | ICD-10-CM | POA: Diagnosis not present

## 2017-04-06 DIAGNOSIS — D1801 Hemangioma of skin and subcutaneous tissue: Secondary | ICD-10-CM | POA: Diagnosis not present

## 2017-04-06 DIAGNOSIS — D2271 Melanocytic nevi of right lower limb, including hip: Secondary | ICD-10-CM | POA: Diagnosis not present

## 2017-04-06 DIAGNOSIS — L821 Other seborrheic keratosis: Secondary | ICD-10-CM | POA: Diagnosis not present

## 2017-04-06 DIAGNOSIS — D2239 Melanocytic nevi of other parts of face: Secondary | ICD-10-CM | POA: Diagnosis not present

## 2017-04-14 NOTE — Progress Notes (Deleted)
Office Visit Note  Patient: Anna Gamble             Date of Birth: [birth year]           MRN: [id number]             PCP: Burnis Medin, MD Referring: Burnis Medin, MD Visit Date: 04/27/2017 Occupation: Retired Glass blower/designer and [place]:  Joint pain .   History of Present Illness: Anna Gamble is a 82 y.o. female in consultation per request of her PCP.  According to patient her symptoms with joint pain that started at least 10 years ago.  She reports having increased pain in her hands and stiffness which she describes mostly over the [facility] and PIP joints.  She also has history of pain and discomfort in her bilateral feet.  She has noticed some swelling in her feet intermittently.  She has arthritis in her bilateral knee joints for multiple years.  She underwent right total knee replacement in 2013 Dr. Mayer Camel.  She has been seeing Dr. Lynann Bologna who has diagnosed her with severe osteoarthritis in her left knee joint.  She gives history of lower back pain for multiple years and she has been diagnosed with facet joint arthropathy.  She had injury to her right shoulder several years ago and is still have some intermittent discomfort in her right shoulder.  She states she has had dry mouth and dry eye symptoms for at least 20 years for which she has seen different physicians in the past.  Activities of Daily Living:  Patient reports morning stiffness for 2 hours.   Patient Denies nocturnal pain.  Difficulty dressing/grooming: Denies Difficulty climbing stairs: Reports Difficulty getting out of chair: Reports Difficulty using hands for taps, buttons, cutlery, and/or writing: Denies   Review of Systems  Constitutional: Positive for fatigue. Negative for night sweats, weight gain and weight loss.  HENT: Positive for mouth dryness. Negative for mouth sores, trouble swallowing, trouble swallowing and nose dryness.   Eyes: Positive for dryness. Negative for pain, redness and  visual disturbance.  Respiratory: Positive for shortness of breath. Negative for cough and difficulty breathing.   Cardiovascular: Negative for chest pain, palpitations, hypertension, irregular heartbeat and swelling in legs/feet.  Gastrointestinal: Negative for blood in stool, constipation and diarrhea.  Endocrine: Negative for increased urination.  Genitourinary: Negative for vaginal dryness.  Musculoskeletal: Positive for arthralgias, joint pain and morning stiffness. Negative for joint swelling, myalgias, muscle weakness, muscle tenderness and myalgias.  Skin: Positive for hair loss. Negative for color change, rash, skin tightness, ulcers and sensitivity to sunlight.  Allergic/Immunologic: Negative for susceptible to infections.  Neurological: Negative for dizziness, memory loss, night sweats and weakness.  Hematological: Negative for swollen glands.  Psychiatric/Behavioral: Positive for sleep disturbance. Negative for depressed mood. The patient is not nervous/anxious.     PMFS History:  Patient Active Problem List   Diagnosis Date Noted  . Osteopenia of multiple sites 04/27/2017  . Aortic stenosis 04/16/2015  . CAD (coronary artery disease) 03/19/2014  . Generalized abdominal discomfort 01/15/2013  . Back pain 01/15/2013  . Hx of colonoscopy with polypectomy 07/14/2012  . Chest pain 06/17/2011  . Arthritis of knee, right 01/31/2011  . GERD (gastroesophageal reflux disease)   . FOOT PAIN, LEFT 12/31/2009  . FATIGUE 12/31/2009  . COUGH 08/21/2009  . Aortic valve disorder 06/18/2008  . HEADACHE 04/15/2008  . HEART MURMUR, SYSTOLIC 50/93/2671  . UNSPECIFIED OSTEOPOROSIS 03/26/2008  . GEN  OSTEOARTHROSIS INVOLVING MULTIPLE SITES 06/05/2007  . IMPAIRED FASTING GLUCOSE 03/23/2007  . FASTING HYPERGLYCEMIA 12/27/2006  . HYPERLIPIDEMIA 07/21/2006  . Obesity 07/21/2006  . Essential hypertension 07/21/2006  . GERD 07/21/2006  . LOW BACK PAIN 07/21/2006  . SOB 07/21/2006  .  DIVERTICULITIS, HX OF 07/21/2006    Past Medical History:  Diagnosis Date  . Aortic stenosis   . Colon polyp   . Coronary artery disease    aortic stenosis/mild per ECHO  . Dyspnea   . Generalized osteoarthrosis, involving multiple sites   . GERD (gastroesophageal reflux disease)    dilitation  . Hyperlipidemia   . Hypertension   . Impaired fasting glucose   . Obesity   . Personal history of other diseases of digestive system   . PONV (postoperative nausea and vomiting)     Family History  Problem Relation Age of Onset  . Coronary artery disease Unknown        female- 1st degree relative  . Coronary artery disease Unknown        female- 1st degree relative  . Hypertension Unknown   . Hyperlipidemia Unknown   . Heart attack Mother        age 24's  . Heart attack Father        age 48's  . Stroke Brother        died   . Hearing loss Son   . Sleep apnea Son   . Aortic stenosis Sister   . Stroke Brother   . Heart attack Brother   . Rheum arthritis Sister   . Osteoarthritis Sister   . Arthritis Sister   . Heart Problems Brother    Past Surgical History:  Procedure Laterality Date  . APPENDECTOMY    . CHOLECYSTECTOMY    . FOOT SURGERY     rt  . KNEE SURGERY     rt  . LEFT AND RIGHT HEART CATHETERIZATION WITH CORONARY ANGIOGRAM N/A 10/10/2013   Procedure: LEFT AND RIGHT HEART CATHETERIZATION WITH CORONARY ANGIOGRAM;  Surgeon: Burnell Blanks, MD;  Location[facility] CATH LAB;  Service: Cardiovascular;  Laterality: N/A;  . TONSILLECTOMY    . TOTAL KNEE ARTHROPLASTY  02/01/2011   Procedure: TOTAL KNEE ARTHROPLASTY;  Surgeon: Kerin Salen;  Location: [place];  Service: Orthopedics;  Laterality: Right;  DEPUY/SIGMA   Social History   Social History Narrative   hh of 3 -4  Married   [place] son   At home    Invalid son paraplegia and husband    And grandson recently     No pets   Fluctuating hh membors she cooks and prepares for them            Objective: Vital  Signs: BP 134/68 (BP Location: Left Arm, Patient Position: Sitting, Cuff Size: Large)   Pulse 87   Resp 20   Ht 5\' 2"  (1.575 m)   Wt 249 lb 8 oz (113.2 kg)   BMI 45.63 kg/m    Physical Exam  Constitutional: She is oriented to person, place, and time. She appears well-developed and well-nourished.  HENT:  Head: Normocephalic and atraumatic.  Eyes: Conjunctivae and EOM are normal.  Neck: Normal range of motion.  Cardiovascular: Normal rate, regular rhythm and intact distal pulses.  Murmur heard. Pulmonary/Chest: Effort normal and breath sounds normal.  Abdominal: Soft. Bowel sounds are normal.  Lymphadenopathy:    She has no cervical adenopathy.  Neurological: She is alert and oriented to person, place, and time.  Skin: Skin is warm and dry. Capillary refill takes less than 2 seconds.  Psychiatric: She has a normal mood and affect. Her behavior is normal.  Nursing note and vitals reviewed.    Musculoskeletal Exam: C-spine thoracic spine good range of motion.  She is some discomfort range of motion of the lumbar spine.  Shoulder joints, elbow joints, wrist joints were in good range of motion with no swelling.  She has DIP PIP thickening in her hands and CMC thickening in her hands consistent with osteoarthritis.  No synovitis was noted.  She has some limitation of range of motion of her left hip joint.  Her right knee has been replaced.  She is crepitus with range of motion of her left knee joint without any warmth swelling or effusion.  She has pes planus bilaterally and hammertoes.  She had tenderness across PIPs of her toes.  CDAI Exam: No CDAI exam completed.    Investigation: No additional findings. Component     Latest Ref Rng & Units 11/09/2016  Anit Nuclear Antibody(ANA)     NEGATIVE POSITIVE (A)  SSA (Ro) (ENA) Antibody, IgG     <1.0 NEG AI <1.0 NEG  SSB (La) (ENA) Antibody, IgG     <1.0 NEG AI <1.0 NEG  RA Latex Turbid.     <14 IU/mL <14  Interpretation        ANA  Pattern 1      NUCLEOLAR (A)  ANA Titer 1     titer 1:40 (H)  CRP     0.5 - 20.0 mg/dL 0.1 (L)   CBC Latest Ref Rng & Units 11/25/2016 06/15/2016 04/16/2015  WBC 3.4 - 10.8 x10E3/uL 6.6 6.9 7.1  Hemoglobin 11.1 - 15.9 g/dL 12.8 13.5 13.4  Hematocrit 34.0 - 46.6 % 37.3 39.9 39.5  Platelets 150 - 379 x10E3/uL 264 291.0 274.0    CMP Latest Ref Rng & Units 11/29/2016 06/15/2016 06/11/2015  Glucose 65 - 99 mg/dL 144(H) 115(H) -  BUN 8 - 27 mg/dL 16 11 -  Creatinine 0.57 - 1.00 mg/dL 0.81 0.81 -  Sodium 134 - 144 mmol/L 141 140 -  Potassium 3.5 - 5.2 mmol/L 4.4 4.5 -  Chloride 96 - 106 mmol/L 102 102 -  CO2 20 - 29 mmol/L 31(H) 30 -  Calcium 8.7 - 10.3 mg/dL 9.8 9.5 -  Total Protein 6.0 - 8.3 g/dL - 6.7 6.5  Total Bilirubin 0.2 - 1.2 mg/dL - 0.6 0.5  Alkaline Phos 39 - 117 U/L - 55 59  AST 0 - 37 U/L - 20 20  ALT 0 - 35 U/L - 22 22      Imaging: No results found.  Speciality Comments: No specialty comments available.    Procedures:  No procedures performed Allergies: Lisinopril and Statins   Assessment / Plan:     Visit Diagnoses: Polyarthralgia: Patient complains of pain in multiple joints.  Dry mouth -she gives history of dry mouth and dry eyes for over 20 years.  She has been using some over-the-counter products.  All autoimmune workup is negative.  I discussed use of Biotene and eyedrops.  11/09/16 Ro and La negative ANA 1:40 Nucleolar   Dry eye: Use of Systane balance eyedrops was discussed.  Use of omega-3 was also discussed.  Pain in both hands -she has DIP PIP and CMC thickening consistent with osteoarthritis.  Plan: XR Hand 2 View Right, XR Hand 2 View Left  Pain in both feet -he does have severe arthritis  in her feet with hammertoes and pes planus.  Plan: XR Foot 2 Views Right, XR Foot 2 Views Left   History of total knee replacement, right: She had right total knee replacement in 2013 by Dr. Mayer Camel and doing fairly well.  Primary osteoarthritis of left knee: She  has been followed by Dr. Lynann Bologna.  She states she had cortisone injection recently and was suggested total knee replacement in future.  Chronic midline low back pain without sciatica -I reviewed her MRI from 2017 which showed facet joint arthropathy and mild disc disease.  She does have chronic lower back pain.    Class III obesity: Weight loss diet and exercise was discussed.  That would benefit her feet and knee joint arthritis.  Other medical problems are listed as follows:  Essential hypertension  History of hyperlipidemia  History of coronary artery disease  History of gastroesophageal reflux (GERD)  History of colonic polyps  Aortic valve stenosis, etiology of cardiac valve disease unspecified  History of diverticulitis  Osteopenia of multiple sites: DEXA was reviewed and use of calcium and vitamin D was discussed.    Orders: Orders Placed This Encounter  Procedures  . XR Hand 2 View Right  . XR Hand 2 View Left  . XR Foot 2 Views Right  . XR Foot 2 Views Left   No orders of the defined types were placed in this encounter.   Face-to-face time spent with patient was *** minutes. 50% of time was spent in counseling and coordination of care.  Follow-Up Instructions: Return for Osteoarthritis.   Bo Merino, MD  Note - This record has been created using Editor, commissioning.  Chart creation errors have been sought, but may not always  have been located. Such creation errors do not reflect on  the standard of medical care.

## 2017-04-26 ENCOUNTER — Ambulatory Visit (HOSPITAL_COMMUNITY): Payer: Medicare Other | Attending: Cardiology

## 2017-04-26 ENCOUNTER — Other Ambulatory Visit: Payer: Self-pay

## 2017-04-26 DIAGNOSIS — Z6841 Body Mass Index (BMI) 40.0 and over, adult: Secondary | ICD-10-CM | POA: Insufficient documentation

## 2017-04-26 DIAGNOSIS — I10 Essential (primary) hypertension: Secondary | ICD-10-CM | POA: Insufficient documentation

## 2017-04-26 DIAGNOSIS — I350 Nonrheumatic aortic (valve) stenosis: Secondary | ICD-10-CM

## 2017-04-26 DIAGNOSIS — I2583 Coronary atherosclerosis due to lipid rich plaque: Secondary | ICD-10-CM | POA: Diagnosis not present

## 2017-04-26 DIAGNOSIS — E669 Obesity, unspecified: Secondary | ICD-10-CM | POA: Diagnosis not present

## 2017-04-26 DIAGNOSIS — E785 Hyperlipidemia, unspecified: Secondary | ICD-10-CM | POA: Insufficient documentation

## 2017-04-26 DIAGNOSIS — R0600 Dyspnea, unspecified: Secondary | ICD-10-CM | POA: Diagnosis not present

## 2017-04-26 DIAGNOSIS — I2510 Atherosclerotic heart disease of native coronary artery without angina pectoris: Secondary | ICD-10-CM | POA: Diagnosis not present

## 2017-04-27 ENCOUNTER — Ambulatory Visit (INDEPENDENT_AMBULATORY_CARE_PROVIDER_SITE_OTHER): Payer: Medicare Other

## 2017-04-27 ENCOUNTER — Other Ambulatory Visit: Payer: Self-pay | Admitting: *Deleted

## 2017-04-27 ENCOUNTER — Encounter: Payer: Self-pay | Admitting: Rheumatology

## 2017-04-27 ENCOUNTER — Ambulatory Visit (INDEPENDENT_AMBULATORY_CARE_PROVIDER_SITE_OTHER): Payer: Self-pay

## 2017-04-27 ENCOUNTER — Ambulatory Visit (INDEPENDENT_AMBULATORY_CARE_PROVIDER_SITE_OTHER): Payer: Medicare Other | Admitting: Rheumatology

## 2017-04-27 VITALS — BP 134/68 | HR 87 | Resp 20 | Ht 62.0 in | Wt 249.5 lb

## 2017-04-27 DIAGNOSIS — I2583 Coronary atherosclerosis due to lipid rich plaque: Secondary | ICD-10-CM

## 2017-04-27 DIAGNOSIS — I10 Essential (primary) hypertension: Secondary | ICD-10-CM

## 2017-04-27 DIAGNOSIS — M545 Low back pain, unspecified: Secondary | ICD-10-CM

## 2017-04-27 DIAGNOSIS — H04129 Dry eye syndrome of unspecified lacrimal gland: Secondary | ICD-10-CM

## 2017-04-27 DIAGNOSIS — M79642 Pain in left hand: Secondary | ICD-10-CM

## 2017-04-27 DIAGNOSIS — M79672 Pain in left foot: Secondary | ICD-10-CM | POA: Diagnosis not present

## 2017-04-27 DIAGNOSIS — Z8679 Personal history of other diseases of the circulatory system: Secondary | ICD-10-CM

## 2017-04-27 DIAGNOSIS — I350 Nonrheumatic aortic (valve) stenosis: Secondary | ICD-10-CM

## 2017-04-27 DIAGNOSIS — M79641 Pain in right hand: Secondary | ICD-10-CM

## 2017-04-27 DIAGNOSIS — I2510 Atherosclerotic heart disease of native coronary artery without angina pectoris: Secondary | ICD-10-CM

## 2017-04-27 DIAGNOSIS — Z6841 Body Mass Index (BMI) 40.0 and over, adult: Secondary | ICD-10-CM

## 2017-04-27 DIAGNOSIS — M8589 Other specified disorders of bone density and structure, multiple sites: Secondary | ICD-10-CM

## 2017-04-27 DIAGNOSIS — M2550 Pain in unspecified joint: Secondary | ICD-10-CM

## 2017-04-27 DIAGNOSIS — M79671 Pain in right foot: Secondary | ICD-10-CM

## 2017-04-27 DIAGNOSIS — Z96651 Presence of right artificial knee joint: Secondary | ICD-10-CM | POA: Diagnosis not present

## 2017-04-27 DIAGNOSIS — Z8719 Personal history of other diseases of the digestive system: Secondary | ICD-10-CM | POA: Diagnosis not present

## 2017-04-27 DIAGNOSIS — Z860100 Personal history of colon polyps, unspecified: Secondary | ICD-10-CM

## 2017-04-27 DIAGNOSIS — R682 Dry mouth, unspecified: Secondary | ICD-10-CM | POA: Diagnosis not present

## 2017-04-27 DIAGNOSIS — G8929 Other chronic pain: Secondary | ICD-10-CM

## 2017-04-27 DIAGNOSIS — M1712 Unilateral primary osteoarthritis, left knee: Secondary | ICD-10-CM | POA: Diagnosis not present

## 2017-04-27 DIAGNOSIS — E66813 Obesity, class 3: Secondary | ICD-10-CM

## 2017-04-27 DIAGNOSIS — Z8639 Personal history of other endocrine, nutritional and metabolic disease: Secondary | ICD-10-CM | POA: Diagnosis not present

## 2017-04-27 NOTE — Patient Instructions (Signed)
Natural anti-inflammatories  You can purchase these at [employer], [employer] or online.  . Turmeric (capsules)  . Ginger (ginger root or capsules)  . Omega 3 (Fish, flax seeds, chia seeds, walnuts, almonds)  . Tart cherry (dried or extract)   Patient should be under the care of a physician while taking these supplements. This may not be reproduced without the permission of Dr. Sandon Yoho.  

## 2017-04-27 NOTE — Progress Notes (Signed)
Office Visit Note   Patient: Anna QUIZHPI  Date of Birth: 09-01-1935  MRN: [id number]  PCP: Burnis Medin, MD  Referring: Burnis Medin, MD   Visit Date: 04/27/2017  Occupation: Retired Glass blower/designer and [place]:  Joint pain .  History of Present Illness: Anna Gamble is a 82 y.o. female in consultation per request of her PCP. According to patient her symptoms with joint pain that started at least 10 years ago. She reports having increased pain in her hands and stiffness which she describes mostly over the [facility] and PIP joints. She also has history of pain and discomfort in her bilateral feet. She has noticed some swelling in her feet intermittently. She has arthritis in her bilateral knee joints for multiple years. She underwent right total knee replacement in 2013 Dr. Mayer Camel. She has been seeing Dr. Lynann Bologna who has diagnosed her with severe osteoarthritis in her left knee joint. She gives history of lower back pain for multiple years and she has been diagnosed with facet joint arthropathy. She had injury to her right shoulder several years ago and is still have some intermittent discomfort in her right shoulder. She states she has had dry mouth and dry eye symptoms for at least 20 years for which she has seen different physicians in the past.   Activities of Daily Living:  Patient reports morning stiffness for 2 hours.  Patient Denies nocturnal pain.  Difficulty dressing/grooming: Denies  Difficulty climbing stairs: Reports  Difficulty getting out of chair: Reports  Difficulty using hands for taps, buttons, cutlery, and/or writing: Denies  Review of Systems  Constitutional: Positive for fatigue. Negative for night sweats, weight gain and weight loss.  HENT: Positive for mouth dryness. Negative for mouth sores, trouble swallowing, trouble swallowing and nose dryness.  Eyes: Positive for dryness. Negative for pain, redness and visual disturbance.  Respiratory: Positive for  shortness of breath. Negative for cough and difficulty breathing.  Cardiovascular: Negative for chest pain, palpitations, hypertension, irregular heartbeat and swelling in legs/feet.  Gastrointestinal: Negative for blood in stool, constipation and diarrhea.  Endocrine: Negative for increased urination.  Genitourinary: Negative for vaginal dryness.  Musculoskeletal: Positive for arthralgias, joint pain and morning stiffness. Negative for joint swelling, myalgias, muscle weakness, muscle tenderness and myalgias.  Skin: Positive for hair loss. Negative for color change, rash, skin tightness, ulcers and sensitivity to sunlight.  Allergic/Immunologic: Negative for susceptible to infections.  Neurological: Negative for dizziness, memory loss, night sweats and weakness.  Hematological: Negative for swollen glands.  Psychiatric/Behavioral: Positive for sleep disturbance. Negative for depressed mood. The patient is not nervous/anxious.   PMFS History:      Patient Active Problem List   Diagnosis Date Noted  . Osteopenia of multiple sites 04/27/2017  . Aortic stenosis 04/16/2015  . CAD (coronary artery disease) 03/19/2014  . Generalized abdominal discomfort 01/15/2013  . Back pain 01/15/2013  . Hx of colonoscopy with polypectomy 07/14/2012  . Chest pain 06/17/2011  . Arthritis of knee, right 01/31/2011  . GERD (gastroesophageal reflux disease)   . FOOT PAIN, LEFT 12/31/2009  . FATIGUE 12/31/2009  . COUGH 08/21/2009  . Aortic valve disorder 06/18/2008  . HEADACHE 04/15/2008  . HEART MURMUR, SYSTOLIC 23/55/7322  . UNSPECIFIED OSTEOPOROSIS 03/26/2008  . GEN OSTEOARTHROSIS INVOLVING MULTIPLE SITES 06/05/2007  . IMPAIRED FASTING GLUCOSE 03/23/2007  . FASTING HYPERGLYCEMIA 12/27/2006  . HYPERLIPIDEMIA 07/21/2006  . Obesity 07/21/2006  . Essential hypertension 07/21/2006  . GERD 07/21/2006  . LOW BACK  PAIN 07/21/2006  . SOB 07/21/2006  . DIVERTICULITIS, HX OF 07/21/2006        Past  Medical History:  Diagnosis Date  . Aortic stenosis   . Colon polyp   . Coronary artery disease    aortic stenosis/mild per ECHO  . Dyspnea   . Generalized osteoarthrosis, involving multiple sites   . GERD (gastroesophageal reflux disease)    dilitation  . Hyperlipidemia   . Hypertension   . Impaired fasting glucose   . Obesity   . Personal history of other diseases of digestive system   . PONV (postoperative nausea and vomiting)         Family History  Problem Relation Age of Onset  . Coronary artery disease Unknown    female- 1st degree relative  . Coronary artery disease Unknown    female- 1st degree relative  . Hypertension Unknown   . Hyperlipidemia Unknown   . Heart attack Mother    age 23's  . Heart attack Father    age 93's  . Stroke Brother    died   . Hearing loss Son   . Sleep apnea Son   . Aortic stenosis Sister   . Stroke Brother   . Heart attack Brother   . Rheum arthritis Sister   . Osteoarthritis Sister   . Arthritis Sister   . Heart Problems Brother         Past Surgical History:  Procedure Laterality Date  . APPENDECTOMY    . CHOLECYSTECTOMY    . FOOT SURGERY     rt  . KNEE SURGERY     rt  . LEFT AND RIGHT HEART CATHETERIZATION WITH CORONARY ANGIOGRAM N/A 10/10/2013   Procedure: LEFT AND RIGHT HEART CATHETERIZATION WITH CORONARY ANGIOGRAM; Surgeon: Burnell Blanks, MD; Location[facility] CATH LAB; Service: Cardiovascular; Laterality: N/A;  . TONSILLECTOMY    . TOTAL KNEE ARTHROPLASTY  02/01/2011   Procedure: TOTAL KNEE ARTHROPLASTY; Surgeon: Kerin Salen; Location: [facility]; Service: Orthopedics; Laterality: Right; DEPUY/SIGMA   Social History      Social History Narrative   hh of 3 -4 Married Iraan son At home    Invalid son paraplegia and husband And grandson recently    No pets   Fluctuating hh membors she cooks and prepares for them          Objective:  Vital Signs: BP 134/68 (BP Location: Left Arm, Patient Position: Sitting, Cuff  Size: Large)  Pulse 87  Resp 20  Ht 5\' 2"  (1.575 m)  Wt 249 lb 8 oz (113.2 kg)  BMI 45.63 kg/m   Physical Exam  Constitutional: She is oriented to person, place, and time. She appears well-developed and well-nourished.  HENT:  Head: Normocephalic and atraumatic.  Eyes: Conjunctivae and EOM are normal.  Neck: Normal range of motion.  Cardiovascular: Normal rate, regular rhythm and intact distal pulses.  Murmur heard.  Pulmonary/Chest: Effort normal and breath sounds normal.  Abdominal: Soft. Bowel sounds are normal.  Lymphadenopathy:  She has no cervical adenopathy.  Neurological: She is alert and oriented to person, place, and time.  Skin: Skin is warm and dry. Capillary refill takes less than 2 seconds.  Psychiatric: She has a normal mood and affect. Her behavior is normal.  Nursing note and vitals reviewed.   Musculoskeletal Exam: C-spine thoracic spine good range of motion. She is some discomfort range of motion of the lumbar spine. Shoulder joints, elbow joints, wrist joints were in good range of motion  with no swelling. She has DIP PIP thickening in her hands and CMC thickening in her hands consistent with osteoarthritis. No synovitis was noted. She has some limitation of range of motion of her left hip joint. Her right knee has been replaced. She is crepitus with range of motion of her left knee joint without any warmth swelling or effusion. She has pes planus bilaterally and hammertoes. She had tenderness across PIPs of her toes.   CDAI Exam:  No CDAI exam completed.   Investigation:  No additional findings.  Component Latest Ref Rng & Units 11/09/2016  Anit Nuclear Antibody(ANA) NEGATIVE POSITIVE (A)  SSA (Ro) (ENA) Antibody, IgG <1.0 NEG AI <1.0 NEG  SSB (La) (ENA) Antibody, IgG <1.0 NEG AI <1.0 NEG  RA Latex Turbid. <14 IU/mL <14  Interpretation    ANA Pattern 1  NUCLEOLAR (A)  ANA Titer 1 titer 1:40 (H)  CRP 0.5 - 20.0 mg/dL 0.1 (L)   CBC Latest Ref Rng &  Units 11/25/2016 06/15/2016 04/16/2015  WBC 3.4 - 10.8 x10E3/uL 6.6 6.9 7.1  Hemoglobin 11.1 - 15.9 g/dL 12.8 13.5 13.4  Hematocrit 34.0 - 46.6 % 37.3 39.9 39.5  Platelets 150 - 379 x10E3/uL 264 291.0 274.0   CMP Latest Ref Rng & Units 11/29/2016 06/15/2016 06/11/2015  Glucose 65 - 99 mg/dL 144(H) 115(H) -  BUN 8 - 27 mg/dL 16 11 -  Creatinine 0.57 - 1.00 mg/dL 0.81 0.81 -  Sodium 134 - 144 mmol/L 141 140 -  Potassium 3.5 - 5.2 mmol/L 4.4 4.5 -  Chloride 96 - 106 mmol/L 102 102 -  CO2 20 - 29 mmol/L 31(H) 30 -  Calcium 8.7 - 10.3 mg/dL 9.8 9.5 -  Total Protein 6.0 - 8.3 g/dL - 6.7 6.5  Total Bilirubin 0.2 - 1.2 mg/dL - 0.6 0.5  Alkaline Phos 39 - 117 U/L - 55 59  AST 0 - 37 U/L - 20 20  ALT 0 - 35 U/L - 22 22   Imaging:   Imaging Results     Speciality Comments: No specialty comments available.   Procedures:  No procedures performed  Allergies: Lisinopril and Statins   Assessment / Plan:  Visit Diagnoses: Polyarthralgia: Patient complains of pain in multiple joints.   Dry mouth -she gives history of dry mouth and dry eyes for over 20 years. She has been using some over-the-counter products. All autoimmune workup is negative. I discussed use of Biotene and eyedrops. 11/09/16 Ro and La negative ANA 1:40 Nucleolar   Dry eye: Use of Systane balance eyedrops was discussed. Use of omega-3 was also discussed.   Pain in both hands -she has DIP PIP and CMC thickening consistent with osteoarthritis. Plan: XR Hand 2 View Right, XR Hand 2 View Left. Her clinical examination and x-ray are consistent with osteoarthritis. Joint protection muscle strengthening was discussed.   Pain in both feet -he does have severe arthritis in her feet with hammertoes and pes planus. Plan: XR Foot 2 Views Right, XR Foot 2 Views Left. She has severe osteoarthritis in her bilateral feet. Proper fitting shoes with orthotics were discussed. A list of natural anti-inflammatories was given.   History of total knee  replacement, right: She had right total knee replacement in 2013 by Dr. Mayer Camel and doing fairly well.   Primary osteoarthritis of left knee: She has been followed by Dr. Lynann Bologna. She states she had cortisone injection recently and was suggested total knee replacement in future.   Chronic midline low back  pain without sciatica -I reviewed her MRI from 2017 which showed facet joint arthropathy and mild disc disease. She does have chronic lower back pain.   Class III obesity: Weight loss diet and exercise was discussed. That would benefit her feet and knee joint arthritis.   Other medical problems are listed as follows:   Essential hypertension   History of hyperlipidemia   History of coronary artery disease   History of gastroesophageal reflux (GERD)   History of colonic polyps   Aortic valve stenosis, etiology of cardiac valve disease unspecified   History of diverticulitis   Osteopenia of multiple sites: DEXA was reviewed and use of calcium and vitamin D was discussed.   Orders:     Orders Placed This Encounter  Procedures  . XR Hand 2 View Right  . XR Hand 2 View Left  . XR Foot 2 Views Right  . XR Foot 2 Views Left   No orders of the defined types were placed in this encounter.   Face-to-face time spent with patient was 60 minutes. Greater than 50% of time was spent in counseling and coordination of care.   Follow-Up Instructions: Return if symptoms worsen or fail to improve, for Osteoarthritis.   Bo Merino, MD  Note - This record has been created using Editor, commissioning.  Chart creation errors have been sought, but may not always  have been located. Such creation errors do not reflect on  the standard of medical care.

## 2017-05-09 ENCOUNTER — Telehealth: Payer: Self-pay | Admitting: Cardiology

## 2017-05-09 NOTE — Telephone Encounter (Signed)
Returned call to patient, patient states she is due for a colonoscopy and the anesthesiologist told her they could not do her procedure because "her heart is too bad".  Informed patient to request a surgical clearance be sent to our office to review.   Patient is awaiting on a call back from Dr. Liliane Channel office and she will request this to be sent.

## 2017-05-09 NOTE — Telephone Encounter (Signed)
New message  Pt verbalzied that she is calling for RN

## 2017-05-23 ENCOUNTER — Other Ambulatory Visit: Payer: Self-pay | Admitting: Cardiology

## 2017-05-23 NOTE — Telephone Encounter (Signed)
REFILL 

## 2017-05-27 ENCOUNTER — Other Ambulatory Visit: Payer: Self-pay | Admitting: *Deleted

## 2017-05-27 MED ORDER — LOSARTAN POTASSIUM-HCTZ 50-12.5 MG PO TABS: 1.0000 | ORAL_TABLET | Freq: Every day | ORAL | 1 refills | Status: DC

## 2017-05-30 ENCOUNTER — Ambulatory Visit: Payer: Self-pay | Admitting: Rheumatology

## 2017-06-20 DIAGNOSIS — M1712 Unilateral primary osteoarthritis, left knee: Secondary | ICD-10-CM | POA: Diagnosis not present

## 2017-07-01 NOTE — Progress Notes (Signed)
Chief Complaint  Patient presents with  . Annual Exam  . Medication Management  . Hypertension  . Hyperlipidemia  . Osteoarthritis    HPI: Anna Gamble 82 y.o. comes in today for yearly visit  And med eval    t osee  Cardiology in September     For AS  no particular sx of such   HT:  medicaiton   HLD   Lipid medication  Takes  Taking  20 mg 1/2 of 40  Instead of full dose   Back and sciatica arthritis   No falling .  Using cane .  Balance is odff  No change   Was supposed to due colonoscopy  But  Cause med risk advised to do in hosp  And dr Earlean Shawl would have to go to  [facility] for this  But her care teams are here. Has hx of ? High risk polyps.  Has ongoing ruq pain  For quite a while ? Cause   Has had gb out and has gerd  On meds     Health Maintenance  Topic Date Due  . INFLUENZA VACCINE  08/18/2017  . MAMMOGRAM  09/16/2017  . TETANUS/TDAP  01/01/2020  . DEXA SCAN  Completed  . PNA vac Low Risk Adult  Completed   Health Maintenance Review LIFESTYLE:  Exercise:   Walks with cane limited  Tobacco/ETS:  no Alcohol:  no Sugar beverages:no Sleep: about 6-8   Drug use: no HH: 4  No pets  Son in [place] and grandson  ROS:  Vision cataract surgery not as good  .   heaing   Right ear .  Dec hearing  GEN/ HEENT: No fever, significant weight changes sweats headaches vision problems hearing changes, CV/ PULM; No chest pain  cough, syncope,edema  change in exercise tolerance. GI /GU: No , vomiting, change in bowel habits. No blood in the stool.  SKIN/HEME: ,no acute skin rashes suspicious lesions or bleeding. No lymphadenopathy, nodules, masses.  NEURO/ PSYCH:  No neurologic signs such as weakness IMM/ Allergy: No unusual infections.  Allergy .   REST of 12 system review negative except as per HPI   Past Medical History:  Diagnosis Date  . Aortic stenosis   . Colon polyp   . Coronary artery disease    aortic stenosis/mild per ECHO  . Dyspnea   . Generalized  osteoarthrosis, involving multiple sites   . GERD (gastroesophageal reflux disease)    dilitation  . Hyperlipidemia   . Hypertension   . Impaired fasting glucose   . Obesity   . Personal history of other diseases of digestive system   . PONV (postoperative nausea and vomiting)     Family History  Problem Relation Age of Onset  . Coronary artery disease Unknown        female- 1st degree relative  . Coronary artery disease Unknown        female- 1st degree relative  . Hypertension Unknown   . Hyperlipidemia Unknown   . Heart attack Mother        age 51's  . Heart attack Father        age 31's  . Stroke Brother        died   . Hearing loss Son   . Sleep apnea Son   . Aortic stenosis Sister   . Stroke Brother   . Heart attack Brother   . Rheum arthritis Sister   . Osteoarthritis Sister   .  Arthritis Sister   . Heart Problems Brother     Social History   Socioeconomic History  . Marital status: Married    Spouse name: Not on file  . Number of children: Not on file  . Years of education: Not on file  . Highest education level: Not on file  Occupational History  . Occupation: Retired    Comment: Takes care of [employer]  Social Needs  . Financial resource strain: Not on file  . Food insecurity:    Worry: Not on file    Inability: Not on file  . Transportation needs:    Medical: Not on file    Non-medical: Not on file  Tobacco Use  . Smoking status: Never Smoker  . Smokeless tobacco: Never Used  Substance and Sexual Activity  . Alcohol use: No  . Drug use: No  . Sexual activity: Not on file    Comment: quit 40 yrs ago  Lifestyle  . Physical activity:    Days per week: Not on file    Minutes per session: Not on file  . Stress: Not on file  Relationships  . Social connections:    Talks on phone: Not on file    Gets together: Not on file    Attends religious service: Not on file    Active member of club or organization: Not on file    Attends  meetings of clubs or organizations: Not on file    Relationship status: Not on file  Other Topics Concern  . Not on file  Social History Narrative   hh of 3 -4  Married   [place] son   At home    Invalid son paraplegia and husband    And grandson recently     No pets   Fluctuating hh membors she cooks and prepares for them           Outpatient Encounter Medications as of 07/04/2017  Medication Sig  . aspirin 81 MG tablet Take 81 mg by mouth daily.  . cholecalciferol (VITAMIN D) 1000 UNITS tablet Take 1,000 Units by mouth daily.    Marland Kitchen losartan-hydrochlorothiazide (HYZAAR) 50-12.5 MG tablet Take 1 tablet by mouth daily.  . metoprolol succinate (TOPROL-XL) 50 MG 24 hr tablet TAKE 1 TABLET BY MOUTH  DAILY  . Misc Natural Products (TART CHERRY ADVANCED PO) Take by mouth daily.  . multivitamin (THERAGRAN) per tablet Take 1 tablet by mouth daily.   . Omega-3 1000 MG CAPS Take 1 capsule by mouth every morning.   Marland Kitchen omeprazole-sodium bicarbonate (ZEGERID) 40-1100 MG per capsule Take 1 capsule by mouth every other day. 1 capsule daily  . rosuvastatin (CRESTOR) 40 MG tablet TAKE 1 TABLET BY MOUTH  DAILY (Patient taking differently: TAKE 1 TABLET BY MOUTH  EVERY OTHER DAY)  . vitamin E 400 UNIT capsule Take 400 Units by mouth daily.  . [DISCONTINUED] albuterol (PROVENTIL HFA;VENTOLIN HFA) 108 (90 Base) MCG/ACT inhaler Inhale 2 puffs into the lungs every 4 (four) hours as needed for wheezing or shortness of breath. (Patient not taking: Reported on 04/27/2017)  . [DISCONTINUED] CALCIUM PO Take 1 tablet by mouth daily.     No facility-administered encounter medications on file as of 07/04/2017.     EXAM:  BP 131/64   Pulse 66   Temp 98.6 F (37 C)   Ht 5\' 2"  (1.575 m)   Wt 241 lb (109.3 kg)   BMI 44.08 kg/m   Body mass index is 44.08 kg/m.  Physical Exam: Vital signs reviewed OIZ:TIWP is a well-developed well-nourished alert cooperative   who appears stated age in no acute distress. Walks  antalgic with cane  HEENT: normocephalic atraumatic , Eyes: PERRL EOM's full, conjunctiva clear, Nares: paten,t no deformity discharge or tenderness., Ears: no deformity EAC's clear TMs with normal landmarks. Mouth: clear OP, no lesions, edema.  Moist mucous membranes. Dentition in adequate repair. NECK: supple without masses, thyromegaly or bruits. CHEST/PULM:  Clear to auscultation and percussion breath sounds equal no wheeze , rales or rhonchi. Breast: normal by inspection . No dimpling, discharge, masses, tenderness or discharge . CV: PMI is nondisplaced, S1 S2 no gallops, murmurs, rubs 2/6 sem usb . Peripheral pulses are full without delay.No JVD .  ABDOMEN: Bowel sounds normal tender ruq area no mass   No guard or rebound, no hepato splenomegal no CVA tenderness.   Extremtities:  No clubbing cyanosis or edema, no acute joint swelling or redness no focal atrophy but   Antalgic gait  NEURO:  Oriented x3, cranial nerves 3-12 appear to be intact, no obvious focal weakness,gait arthritic antalgic  SKIN: No acute rashes normal turgor, color, no bruising or petechiae. PSYCH: Oriented, good eye contact, no obvious depression anxiety, cognition and judgment appear normal. LN: no cervical axillaryadenopathy No noted deficits in memory, attention, and speech.   Lab Results  Component Value Date   WBC 7.8 07/04/2017   HGB 14.0 07/04/2017   HCT 41.2 07/04/2017   PLT 290.0 07/04/2017   GLUCOSE 115 (H) 07/04/2017   CHOL 201 (H) 07/04/2017   TRIG 150.0 (H) 07/04/2017   HDL 69.50 07/04/2017   LDLDIRECT 89.0 06/15/2016   LDLCALC 102 (H) 07/04/2017   ALT 25 07/04/2017   AST 18 07/04/2017   NA 140 07/04/2017   K 4.7 07/04/2017   CL 100 07/04/2017   CREATININE 0.88 07/04/2017   BUN 17 07/04/2017   CO2 32 07/04/2017   TSH 1.05 04/16/2015   INR 0.93 10/08/2013   HGBA1C 6.9 (H) 07/04/2017    ASSESSMENT AND PLAN:  Discussed the following assessment and plan:  Essential hypertension - Plan:  Lipid panel, Basic metabolic panel, CBC with Differential/Platelet, Hemoglobin A1c, Hepatic function panel  Medication management - Plan: Lipid panel, Basic metabolic panel, CBC with Differential/Platelet, Hemoglobin A1c, Hepatic function panel  Aortic valve disorder - Plan: Lipid panel, Basic metabolic panel, CBC with Differential/Platelet, Hemoglobin A1c, Hepatic function panel  FASTING HYPERGLYCEMIA - Plan: Lipid panel, Basic metabolic panel, CBC with Differential/Platelet, Hemoglobin A1c, Hepatic function panel  Chronic RUQ pain - Plan: Lipid panel, Basic metabolic panel, CBC with Differential/Platelet, Hemoglobin A1c, Hepatic function panel  Hx of colonoscopy with polypectomy - Plan: Lipid panel, Basic metabolic panel, CBC with Differential/Platelet, Hemoglobin A1c, Hepatic function panel Not sure why she has chonic ruq pain .    See what  Results of labs and may need her to see her GI team again just for the pain evaluation   And consider imaging  abd ct etc.   Patient Care Team: [name], [name], MD as PCP - General Tanda Rockers, MD (Pulmonary Disease) Almedia Balls, MD (Orthopedic Surgery) Richmond Campbell, MD (Gastroenterology) Lelon Perla, MD as Attending Physician (Cardiology) Marica Otter, Hawkins (Optometry)  Patient Instructions  Need to get   Your  colonscopy .      Contact  About  Getting done at hospital with dr Benson [place].      Help. change the snacks   To whole fruit and no processed  carbs chips crackers  Etc . Try this for 3 weeks    Will notify you  of labs when available.   NOtr sure why you are still having the right side pain unless poss from scar tissue    May need the Gi team to give you opinion about this   .     Preventing Type 2 Diabetes Mellitus Type 2 diabetes (type 2 diabetes mellitus) is a long-term (chronic) disease that affects blood sugar (glucose) levels. Normally, a hormone called insulin allows glucose to enter cells in the body. The cells use  glucose for energy. In type 2 diabetes, one or both of these problems may be present:  The body does not make enough insulin.  The body does not respond properly to insulin that it makes (insulin resistance).  Insulin resistance or lack of insulin causes excess glucose to build up in the blood instead of going into cells. As a result, high blood glucose (hyperglycemia) develops, which can cause many complications. Being overweight or obese and having an inactive (sedentary) lifestyle can increase your risk for diabetes. Type 2 diabetes can be delayed or prevented by making certain nutrition and lifestyle changes. What nutrition changes can be made?  Eat healthy meals and snacks regularly. Keep a healthy snack with you for when you get hungry between meals, such as fruit or a handful of nuts.  Eat lean meats and proteins that are low in saturated fats, such as chicken, fish, egg whites, and beans. Avoid processed meats.  Eat plenty of fruits and vegetables and plenty of grains that have not been processed (whole grains). It is recommended that you eat: ? 1?2 cups of fruit every day. ? 2?3 cups of vegetables every day. ? 6?8 oz of whole grains every day, such as oats, whole wheat, bulgur, brown rice, quinoa, and millet.  Eat low-fat dairy products, such as milk, yogurt, and cheese.  Eat foods that contain healthy fats, such as nuts, avocado, olive oil, and canola oil.  Drink water throughout the day. Avoid drinks that contain added sugar, such as soda or sweet tea.  Follow instructions from your health care provider about specific eating or drinking restrictions.  Control how much food you eat at a time (portion size). ? Check food labels to find out the serving sizes of foods. ? Use a kitchen scale to weigh amounts of foods.  Saute or steam food instead of frying it. Cook with water or broth instead of oils or butter.  Limit your intake of: ? Salt (sodium). Have no more than 1 tsp  (2,400 mg) of sodium a day. If you have heart disease or high blood pressure, have less than ? tsp (1,500 mg) of sodium a day. ? Saturated fat. This is fat that is solid at room temperature, such as butter or fat on meat. What lifestyle changes can be made?  Activity  Do moderate-intensity physical activity for at least 30 minutes on at least 5 days of the week, or as much as told by your health care provider.  Ask your health care provider what activities are safe for you. A mix of physical activities may be best, such as walking, swimming, cycling, and strength training.  Try to add physical activity into your day. For example: ? Park in spots that are farther away than usual, so that you walk more. For example, park in a far corner of the parking lot when you go to the office or the grocery  store. ? Take a walk during your lunch break. ? Use stairs instead of elevators or escalators. Weight Loss  Lose weight as directed. Your health care provider can determine how much weight loss is best for you and can help you lose weight safely.  If you are overweight or obese, you may be instructed to lose at least 5?7 % of your body weight. Alcohol and Tobacco   Limit alcohol intake to no more than 1 drink a day for nonpregnant women and 2 drinks a day for men. One drink equals 12 oz of beer, 5 oz of wine, or 1 oz of hard liquor.  Do not use any tobacco products, such as cigarettes, chewing tobacco, and e-cigarettes. If you need help quitting, ask your health care provider. Work With [place] Provider  Have your blood glucose tested regularly, as told by your health care provider.  Discuss your risk factors and how you can reduce your risk for diabetes.  Get screening tests as told by your health care provider. You may have screening tests regularly, especially if you have certain risk factors for type 2 diabetes.  Make an appointment with a diet and nutrition specialist  (registered dietitian). A registered dietitian can help you make a healthy eating plan and can help you understand portion sizes and food labels. Why are these changes important?  It is possible to prevent or delay type 2 diabetes and related health problems by making lifestyle and nutrition changes.  It can be difficult to recognize signs of type 2 diabetes. The best way to avoid possible damage to your body is to take actions to prevent the disease before you develop symptoms. What can happen if changes are not made?  Your blood glucose levels may keep increasing. Having high blood glucose for a long time is dangerous. Too much glucose in your blood can damage your blood vessels, heart, kidneys, nerves, and eyes.  You may develop prediabetes or type 2 diabetes. Type 2 diabetes can lead to many chronic health problems and complications, such as: ? Heart disease. ? Stroke. ? Blindness. ? Kidney disease. ? Depression. ? Poor circulation in the feet and legs, which could lead to surgical removal (amputation) in severe cases. Where to find support:  Ask your health care provider to recommend a registered dietitian, diabetes educator, or weight loss program.  Look for local or online weight loss groups.  Join a gym, fitness club, or outdoor activity group, such as a walking club. Where to find more information: To learn more about diabetes and diabetes prevention, visit:  American Diabetes Association (ADA): www.diabetes.[employer] of Diabetes and Digestive and Kidney Diseases: FindSpin.nl  To learn more about healthy eating, visit:  The U.S. Department of Agriculture Scientist, research (physical sciences)), Choose My Plate: http://wiley-williams.com/  Office of Disease Prevention and Health Promotion (ODPHP), Dietary Guidelines: SurferLive.at  Summary  You can reduce your risk for type 2 diabetes by increasing your physical activity, eating  healthy foods, and losing weight as directed.  Talk with your health care provider about your risk for type 2 diabetes. Ask about any blood tests or screening tests that you need to have. This information is not intended to replace advice given to you by your health care provider. Make sure you discuss any questions you have with your health care provider. Document Released: 04/28/2015 Document Revised: 06/12/2015 Document Reviewed: 02/25/2015 Elsevier Interactive Patient Education  2018 [place]. [name]  M.D.   

## 2017-07-04 ENCOUNTER — Encounter: Payer: Self-pay | Admitting: Internal Medicine

## 2017-07-04 ENCOUNTER — Ambulatory Visit (INDEPENDENT_AMBULATORY_CARE_PROVIDER_SITE_OTHER): Payer: Medicare Other | Admitting: Internal Medicine

## 2017-07-04 VITALS — BP 131/64 | HR 66 | Temp 98.6°F | Ht 62.0 in | Wt 241.0 lb

## 2017-07-04 DIAGNOSIS — R1011 Right upper quadrant pain: Secondary | ICD-10-CM | POA: Diagnosis not present

## 2017-07-04 DIAGNOSIS — Z79899 Other long term (current) drug therapy: Secondary | ICD-10-CM

## 2017-07-04 DIAGNOSIS — Z9889 Other specified postprocedural states: Secondary | ICD-10-CM

## 2017-07-04 DIAGNOSIS — G8929 Other chronic pain: Secondary | ICD-10-CM | POA: Diagnosis not present

## 2017-07-04 DIAGNOSIS — I10 Essential (primary) hypertension: Secondary | ICD-10-CM

## 2017-07-04 DIAGNOSIS — R7309 Other abnormal glucose: Secondary | ICD-10-CM | POA: Diagnosis not present

## 2017-07-04 DIAGNOSIS — I359 Nonrheumatic aortic valve disorder, unspecified: Secondary | ICD-10-CM | POA: Diagnosis not present

## 2017-07-04 DIAGNOSIS — Z86010 Personal history of colonic polyps: Secondary | ICD-10-CM

## 2017-07-04 DIAGNOSIS — I2583 Coronary atherosclerosis due to lipid rich plaque: Secondary | ICD-10-CM | POA: Diagnosis not present

## 2017-07-04 DIAGNOSIS — I2510 Atherosclerotic heart disease of native coronary artery without angina pectoris: Secondary | ICD-10-CM

## 2017-07-04 LAB — HEPATIC FUNCTION PANEL
ALK PHOS: 62 U/L (ref 39–117)
ALT: 25 U/L (ref 0–35)
AST: 18 U/L (ref 0–37)
Albumin: 4.3 g/dL (ref 3.5–5.2)
BILIRUBIN DIRECT: 0.1 mg/dL (ref 0.0–0.3)
BILIRUBIN TOTAL: 0.6 mg/dL (ref 0.2–1.2)
TOTAL PROTEIN: 6.9 g/dL (ref 6.0–8.3)

## 2017-07-04 LAB — CBC WITH DIFFERENTIAL/PLATELET
BASOS ABS: 0 10*3/uL (ref 0.0–0.1)
Basophils Relative: 0.4 % (ref 0.0–3.0)
EOS ABS: 0.1 10*3/uL (ref 0.0–0.7)
Eosinophils Relative: 1.6 % (ref 0.0–5.0)
HEMATOCRIT: 41.2 % (ref 36.0–46.0)
HEMOGLOBIN: 14 g/dL (ref 12.0–15.0)
LYMPHS PCT: 37.8 % (ref 12.0–46.0)
Lymphs Abs: 2.9 10*3/uL (ref 0.7–4.0)
MCHC: 33.8 g/dL (ref 30.0–36.0)
MCV: 97.2 fl (ref 78.0–100.0)
Monocytes Absolute: 0.6 10*3/uL (ref 0.1–1.0)
Monocytes Relative: 8 % (ref 3.0–12.0)
Neutro Abs: 4.1 10*3/uL (ref 1.4–7.7)
Neutrophils Relative %: 52.2 % (ref 43.0–77.0)
Platelets: 290 10*3/uL (ref 150.0–400.0)
RBC: 4.24 Mil/uL (ref 3.87–5.11)
RDW: 13.1 % (ref 11.5–15.5)
WBC: 7.8 10*3/uL (ref 4.0–10.5)

## 2017-07-04 LAB — BASIC METABOLIC PANEL
BUN: 17 mg/dL (ref 6–23)
CALCIUM: 9.8 mg/dL (ref 8.4–10.5)
CO2: 32 meq/L (ref 19–32)
CREATININE: 0.88 mg/dL (ref 0.40–1.20)
Chloride: 100 mEq/L (ref 96–112)
GFR: 65.74 mL/min (ref >60.00)
Glucose, Bld: 115 mg/dL — ABNORMAL HIGH (ref 70–99)
Potassium: 4.7 mEq/L (ref 3.5–5.1)
SODIUM: 140 meq/L (ref 135–145)

## 2017-07-04 LAB — LIPID PANEL
CHOL/HDL RATIO: 3
Cholesterol: 201 mg/dL — ABNORMAL HIGH (ref 0–200)
HDL: 69.5 mg/dL (ref >39.00)
LDL Cholesterol: 102 mg/dL — ABNORMAL HIGH (ref 0–99)
NONHDL: 131.64
Triglycerides: 150 mg/dL — ABNORMAL HIGH (ref 0.0–149.0)
VLDL: 30 mg/dL (ref 0.0–40.0)

## 2017-07-04 LAB — HEMOGLOBIN A1C: Hgb A1c MFr Bld: 6.9 % — ABNORMAL HIGH (ref 4.6–6.5)

## 2017-07-04 NOTE — Patient Instructions (Addendum)
Need to get   Your  colonscopy .      Contact  About  Getting done at hospital with dr Benson [place].      Help. change the snacks   To whole fruit and no processed carbs chips crackers  Etc . Try this for 3 weeks    Will notify you  of labs when available.   NOtr sure why you are still having the right side pain unless poss from scar tissue    May need the Gi team to give you opinion about this   .     Preventing Type 2 Diabetes Mellitus Type 2 diabetes (type 2 diabetes mellitus) is a long-term (chronic) disease that affects blood sugar (glucose) levels. Normally, a hormone called insulin allows glucose to enter cells in the body. The cells use glucose for energy. In type 2 diabetes, one or both of these problems may be present:  The body does not make enough insulin.  The body does not respond properly to insulin that it makes (insulin resistance).  Insulin resistance or lack of insulin causes excess glucose to build up in the blood instead of going into cells. As a result, high blood glucose (hyperglycemia) develops, which can cause many complications. Being overweight or obese and having an inactive (sedentary) lifestyle can increase your risk for diabetes. Type 2 diabetes can be delayed or prevented by making certain nutrition and lifestyle changes. What nutrition changes can be made?  Eat healthy meals and snacks regularly. Keep a healthy snack with you for when you get hungry between meals, such as fruit or a handful of nuts.  Eat lean meats and proteins that are low in saturated fats, such as chicken, fish, egg whites, and beans. Avoid processed meats.  Eat plenty of fruits and vegetables and plenty of grains that have not been processed (whole grains). It is recommended that you eat: ? 1?2 cups of fruit every day. ? 2?3 cups of vegetables every day. ? 6?8 oz of whole grains every day, such as oats, whole wheat, bulgur, brown rice, quinoa, and millet.  Eat low-fat dairy products, such  as milk, yogurt, and cheese.  Eat foods that contain healthy fats, such as nuts, avocado, olive oil, and canola oil.  Drink water throughout the day. Avoid drinks that contain added sugar, such as soda or sweet tea.  Follow instructions from your health care provider about specific eating or drinking restrictions.  Control how much food you eat at a time (portion size). ? Check food labels to find out the serving sizes of foods. ? Use a kitchen scale to weigh amounts of foods.  Saute or steam food instead of frying it. Cook with water or broth instead of oils or butter.  Limit your intake of: ? Salt (sodium). Have no more than 1 tsp (2,400 mg) of sodium a day. If you have heart disease or high blood pressure, have less than ? tsp (1,500 mg) of sodium a day. ? Saturated fat. This is fat that is solid at room temperature, such as butter or fat on meat. What lifestyle changes can be made?  Activity  Do moderate-intensity physical activity for at least 30 minutes on at least 5 days of the week, or as much as told by your health care provider.  Ask your health care provider what activities are safe for you. A mix of physical activities may be best, such as walking, swimming, cycling, and strength training.  Try to add physical  activity into your day. For example: ? Park in spots that are farther away than usual, so that you walk more. For example, park in a far corner of the parking lot when you go to the office or the grocery store. ? Take a walk during your lunch break. ? Use stairs instead of elevators or escalators. Weight Loss  Lose weight as directed. Your health care provider can determine how much weight loss is best for you and can help you lose weight safely.  If you are overweight or obese, you may be instructed to lose at least 5?7 % of your body weight. Alcohol and Tobacco   Limit alcohol intake to no more than 1 drink a day for nonpregnant women and 2 drinks a day for  men. One drink equals 12 oz of beer, 5 oz of wine, or 1 oz of hard liquor.  Do not use any tobacco products, such as cigarettes, chewing tobacco, and e-cigarettes. If you need help quitting, ask your health care provider. Work With [facility] Provider  Have your blood glucose tested regularly, as told by your health care provider.  Discuss your risk factors and how you can reduce your risk for diabetes.  Get screening tests as told by your health care provider. You may have screening tests regularly, especially if you have certain risk factors for type 2 diabetes.  Make an appointment with a diet and nutrition specialist (registered dietitian). A registered dietitian can help you make a healthy eating plan and can help you understand portion sizes and food labels. Why are these changes important?  It is possible to prevent or delay type 2 diabetes and related health problems by making lifestyle and nutrition changes.  It can be difficult to recognize signs of type 2 diabetes. The best way to avoid possible damage to your body is to take actions to prevent the disease before you develop symptoms. What can happen if changes are not made?  Your blood glucose levels may keep increasing. Having high blood glucose for a long time is dangerous. Too much glucose in your blood can damage your blood vessels, heart, kidneys, nerves, and eyes.  You may develop prediabetes or type 2 diabetes. Type 2 diabetes can lead to many chronic health problems and complications, such as: ? Heart disease. ? Stroke. ? Blindness. ? Kidney disease. ? Depression. ? Poor circulation in the feet and legs, which could lead to surgical removal (amputation) in severe cases. Where to find support:  Ask your health care provider to recommend a registered dietitian, diabetes educator, or weight loss program.  Look for local or online weight loss groups.  Join a gym, fitness club, or outdoor activity group, such  as a walking club. Where to find more information: To learn more about diabetes and diabetes prevention, visit:  American Diabetes Association (ADA): www.diabetes.[employer] of Diabetes and Digestive and Kidney Diseases: FindSpin.nl  To learn more about healthy eating, visit:  The U.S. Department of Agriculture Scientist, research (physical sciences)), Choose My Plate: http://wiley-williams.com/  Office of Disease Prevention and Health Promotion (ODPHP), Dietary Guidelines: SurferLive.at  Summary  You can reduce your risk for type 2 diabetes by increasing your physical activity, eating healthy foods, and losing weight as directed.  Talk with your health care provider about your risk for type 2 diabetes. Ask about any blood tests or screening tests that you need to have. This information is not intended to replace advice given to you by your health care  provider. Make sure you discuss any questions you have with your health care provider. Document Released: 04/28/2015 Document Revised: 06/12/2015 Document Reviewed: 02/25/2015 Elsevier Interactive Patient Education  [employer].

## 2017-07-11 ENCOUNTER — Telehealth: Payer: Self-pay | Admitting: *Deleted

## 2017-07-11 NOTE — Telephone Encounter (Signed)
Copied from [place] [id number]. Topic: General - Other >> Jul 08, 2017  2:47 PM Marin Olp L wrote: Reason for CRM: Patient calling for lab results.

## 2017-07-13 ENCOUNTER — Other Ambulatory Visit: Payer: Self-pay | Admitting: *Deleted

## 2017-07-13 NOTE — Telephone Encounter (Signed)
Notes recorded by Virl Cagey, CMA on 07/13/2017 at 8:49 AM EDT Results reviewed with patient, expressed understanding. Nothing further needed.  Ov scheduled with Dr Regis Bill 11/14/17 at 11am with A1C

## 2017-08-26 ENCOUNTER — Telehealth: Payer: Self-pay | Admitting: Cardiology

## 2017-08-26 MED ORDER — LOSARTAN POTASSIUM 25 MG PO TABS: 50.0000 mg | ORAL_TABLET | Freq: Every day | ORAL | 3 refills | Status: DC

## 2017-08-26 MED ORDER — HYDROCHLOROTHIAZIDE 12.5 MG PO CAPS: 12.5000 mg | ORAL_CAPSULE | Freq: Every day | ORAL | 3 refills | Status: DC

## 2017-08-26 NOTE — Telephone Encounter (Signed)
Spoke with patient who states [facility] cannot provide her with losartan-hctz 50-12.5mg  - they are out of stock.   Offered to send in Rx for the 2 medications the combo is made up of but patient wants MD to determine what she should do - if this is recommended or a change in medications altogether  Patient would like a call back with decision  Routed to [name]

## 2017-08-26 NOTE — Telephone Encounter (Signed)
Spoke with pt, Aware of dr crenshaw's recommendations. New script sent to the pharmacy  

## 2017-08-26 NOTE — Telephone Encounter (Signed)
Would treat with cozaar 50 mg daily and HCTZ 12.5 mg daily Kirk Ruths

## 2017-08-26 NOTE — Telephone Encounter (Signed)
New Message    Pt c/o medication issue:  1. Name of Medication: losartan-hydrochlorothiazide (HYZAAR) 50-12.5 MG tablet  2. How are you currently taking this medication (dosage and times per day)? Take 1 tablet by mouth daily.  3. Are you having a reaction (difficulty breathing--STAT)? None   4. What is your medication issue? Patient states that the pharmacy is out of the losartan so she wants to know if she can be changed to something else.

## 2017-09-12 ENCOUNTER — Other Ambulatory Visit: Payer: Self-pay | Admitting: Orthopedic Surgery

## 2017-09-12 DIAGNOSIS — M545 Low back pain, unspecified: Secondary | ICD-10-CM

## 2017-09-12 DIAGNOSIS — M25551 Pain in right hip: Secondary | ICD-10-CM | POA: Diagnosis not present

## 2017-09-20 ENCOUNTER — Encounter: Payer: Self-pay | Admitting: Internal Medicine

## 2017-09-20 DIAGNOSIS — Z1231 Encounter for screening mammogram for malignant neoplasm of breast: Secondary | ICD-10-CM | POA: Diagnosis not present

## 2017-09-21 ENCOUNTER — Ambulatory Visit
Admission: RE | Admit: 2017-09-21 | Discharge: 2017-09-21 | Disposition: A | Discharge status: Home / Self Care | Payer: Medicare Other | Source: Ambulatory Visit | Attending: Orthopedic Surgery | Admitting: Orthopedic Surgery

## 2017-09-21 DIAGNOSIS — M545 Low back pain, unspecified: Secondary | ICD-10-CM

## 2017-09-21 DIAGNOSIS — M48061 Spinal stenosis, lumbar region without neurogenic claudication: Secondary | ICD-10-CM | POA: Diagnosis not present

## 2017-09-22 NOTE — Progress Notes (Signed)
HPI: FU aortic stenosis. Placed on beta blocker previously secondary to PVCs. Cardiac catheterization September 2015 showed a 20% left main and no other obstructive disease. Ejection fraction 60-65%. Mild to moderate aortic stenosis.Nuclear study 11/18 showed EF 62, probable shifting breast attenuation. CTA 11/18 showed no pulmonary embolus. Echocardiogram April 2019 showed normal LV systolic function, mild diastolic dysfunction, moderate aortic stenosis with mean gradient 36 mmHg; moderate LAE.  Since last seen,she continues to describe dyspnea on exertion which may be worse.  No orthopnea or PND.  Minimal pedal edema.  Occasional brief sharp pain but no exertional chest pain.  No syncope.  Current Outpatient Medications  Medication Sig Dispense Refill  . aspirin 81 MG tablet Take 81 mg by mouth daily.    . cholecalciferol (VITAMIN D) 1000 UNITS tablet Take 1,000 Units by mouth daily.      . hydrochlorothiazide (MICROZIDE) 12.5 MG capsule Take 1 capsule (12.5 mg total) by mouth daily. 90 capsule 3  . losartan (COZAAR) 25 MG tablet Take 2 tablets (50 mg total) by mouth daily. 180 tablet 3  . metoprolol succinate (TOPROL-XL) 50 MG 24 hr tablet TAKE 1 TABLET BY MOUTH  DAILY 90 tablet 1  . Misc Natural Products (TART CHERRY ADVANCED PO) Take by mouth daily.    . multivitamin (THERAGRAN) per tablet Take 1 tablet by mouth daily.     . Omega-3 1000 MG CAPS Take 1 capsule by mouth every morning.     Marland Kitchen omeprazole-sodium bicarbonate (ZEGERID) 40-1100 MG per capsule Take 1 capsule by mouth every other day. 1 capsule daily    . rosuvastatin (CRESTOR) 40 MG tablet TAKE 1 TABLET BY MOUTH  DAILY (Patient taking differently: TAKE 1 TABLET BY MOUTH  EVERY OTHER DAY) 90 tablet 2  . vitamin E 400 UNIT capsule Take 400 Units by mouth daily.     No current facility-administered medications for this visit.      Past Medical History:  Diagnosis Date  . Aortic stenosis   . Aortic valve disorder 06/18/2008     Qualifier: Diagnosis of  By: [name], MD, Kandyce Rud   . CAD (coronary artery disease) 03/19/2014  . Chest pain 06/17/2011  . Colon polyp   . Coronary artery disease    aortic stenosis/mild per ECHO  . Dyspnea   . Essential hypertension 07/21/2006   Qualifier: Diagnosis of  By: Hulan Saas, CMA (AAMA), Quita Skye   . FATIGUE 12/31/2009   Qualifier: Diagnosis of  By: Regis Bill MD, [name]   . Generalized osteoarthrosis, involving multiple sites   . GERD (gastroesophageal reflux disease)    dilitation  . HEART MURMUR, SYSTOLIC 6/50/3546   Qualifier: Diagnosis of  By: Regis Bill MD, [name]   . Hx of colonoscopy with polypectomy 07/14/2012   medoff   . Hyperlipidemia   . HYPERLIPIDEMIA 07/21/2006   Qualifier: Diagnosis of  By: Hulan Saas, CMA (AAMA), Quita Skye   . Hypertension   . Impaired fasting glucose   . Obesity   . Personal history of other diseases of digestive system   . PONV (postoperative nausea and vomiting)   . SOB 07/21/2006   PFT's 2005   C/w classic obesity effects with disporportionate ERV reduction      Past Surgical History:  Procedure Laterality Date  . APPENDECTOMY    . CHOLECYSTECTOMY    . FOOT SURGERY     rt  . KNEE SURGERY     rt  . LEFT AND RIGHT HEART  CATHETERIZATION WITH CORONARY ANGIOGRAM N/A 10/10/2013   Procedure: LEFT AND RIGHT HEART CATHETERIZATION WITH CORONARY ANGIOGRAM;  Surgeon: Burnell Blanks, MD;  Location: [facility] CATH LAB;  Service: Cardiovascular;  Laterality: N/A;  . TONSILLECTOMY    . TOTAL KNEE ARTHROPLASTY  02/01/2011   Procedure: TOTAL KNEE ARTHROPLASTY;  Surgeon: Kerin Salen;  Location: [place];  Service: Orthopedics;  Laterality: Right;  DEPUY/SIGMA    Social History   Socioeconomic History  . Marital status: Married    Spouse name: Not on file  . Number of children: Not on file  . Years of education: Not on file  . Highest education level: Not on file  Occupational History  . Occupation: Retired    Comment: Takes care of  [employer]  Social Needs  . Financial resource strain: Not on file  . Food insecurity:    Worry: Not on file    Inability: Not on file  . Transportation needs:    Medical: Not on file    Non-medical: Not on file  Tobacco Use  . Smoking status: Never Smoker  . Smokeless tobacco: Never Used  Substance and Sexual Activity  . Alcohol use: No  . Drug use: No  . Sexual activity: Not on file    Comment: quit 40 yrs ago  Lifestyle  . Physical activity:    Days per week: Not on file    Minutes per session: Not on file  . Stress: Not on file  Relationships  . Social connections:    Talks on phone: Not on file    Gets together: Not on file    Attends religious service: Not on file    Active member of club or organization: Not on file    Attends meetings of clubs or organizations: Not on file    Relationship status: Not on file  . Intimate partner violence:    Fear of current or ex partner: Not on file    Emotionally abused: Not on file    Physically abused: Not on file    Forced sexual activity: Not on file  Other Topics Concern  . Not on file  Social History Narrative   hh of 3 -4  Married   [place] son   At home    Invalid son paraplegia and husband    And grandson recently     No pets   Fluctuating hh membors she cooks and prepares for them           Family History  Problem Relation Age of Onset  . Coronary artery disease Unknown        female- 1st degree relative  . Coronary artery disease Unknown        female- 1st degree relative  . Hypertension Unknown   . Hyperlipidemia Unknown   . Heart attack Mother        age 37's  . Heart attack Father        age 28's  . Stroke Brother        died   . Hearing loss Son   . Sleep apnea Son   . Aortic stenosis Sister   . Stroke Brother   . Heart attack Brother   . Rheum arthritis Sister   . Osteoarthritis Sister   . Arthritis Sister   . Heart Problems Brother     ROS: Back pain but no fevers or chills,  productive cough, hemoptysis, dysphasia, odynophagia, melena, hematochezia, dysuria, hematuria, rash, seizure activity, orthopnea, PND,  pedal edema, claudication. Remaining systems are negative.  Physical Exam: Well-developed morbidly obese in no acute distress.  Skin is warm and dry.  HEENT is normal.  Neck is supple.  Chest is clear to auscultation with normal expansion.  Cardiovascular exam is regular rate and rhythm.  3/6 systolic murmur left sternal border.  S2 is mildly diminished. Abdominal exam nontender or distended. No masses palpated. Extremities show trace edema. neuro grossly intact  A/P  1 aortic stenosis-very difficult situation.  She does describe dyspnea on exertion which may be worse.  However she has long-standing dyspnea on exertion which is felt to be multifactorial including deconditioning, obesity hypoventilation syndrome and also possible contribution from aortic stenosis.  Most recent echocardiogram showed moderate to severe aortic stenosis.  I will repeat her echocardiogram in 4 weeks.  She may require TAVR in the near future.  I will see her back immediately after her echocardiogram.  2 Hypertension-blood pressure is controlled.  Continue present medications.  3 hyperlipidemia-continue statin.  4 coronary artery disease-continue aspirin and statin.  5 morbid obesity-we discussed the importance of weight loss.  Kirk Ruths, MD

## 2017-09-23 ENCOUNTER — Encounter: Payer: Self-pay | Admitting: Internal Medicine

## 2017-09-26 ENCOUNTER — Ambulatory Visit (INDEPENDENT_AMBULATORY_CARE_PROVIDER_SITE_OTHER): Payer: Medicare Other | Admitting: Cardiology

## 2017-09-26 ENCOUNTER — Encounter: Payer: Self-pay | Admitting: Cardiology

## 2017-09-26 VITALS — BP 118/78 | HR 69 | Ht 62.0 in | Wt 249.0 lb

## 2017-09-26 DIAGNOSIS — M47816 Spondylosis without myelopathy or radiculopathy, lumbar region: Secondary | ICD-10-CM | POA: Diagnosis not present

## 2017-09-26 DIAGNOSIS — I10 Essential (primary) hypertension: Secondary | ICD-10-CM | POA: Diagnosis not present

## 2017-09-26 DIAGNOSIS — I350 Nonrheumatic aortic (valve) stenosis: Secondary | ICD-10-CM

## 2017-09-26 DIAGNOSIS — I2510 Atherosclerotic heart disease of native coronary artery without angina pectoris: Secondary | ICD-10-CM | POA: Diagnosis not present

## 2017-09-26 DIAGNOSIS — E7800 Pure hypercholesterolemia, unspecified: Secondary | ICD-10-CM

## 2017-09-26 DIAGNOSIS — I2583 Coronary atherosclerosis due to lipid rich plaque: Secondary | ICD-10-CM | POA: Diagnosis not present

## 2017-09-26 NOTE — Patient Instructions (Addendum)
Medication Instructions: Your physician recommends that you continue on your current medications as directed.    If you need a refill on your cardiac medications before your next appointment, please call your pharmacy.   Labwork: None  Procedures/Testing: Keep appointment for ECHO   Follow-Up: Your physician wants you to follow-up after ECHO on 10/27/17  Special Instructions:    Thank you for choosing [facility] at [facility]!!

## 2017-09-26 NOTE — Addendum Note (Signed)
Addended by: Meryl Crutch on: 09/26/2017 11:39 AM   Modules accepted: Orders

## 2017-10-27 ENCOUNTER — Other Ambulatory Visit: Payer: Self-pay

## 2017-10-27 ENCOUNTER — Ambulatory Visit (HOSPITAL_COMMUNITY): Payer: Medicare Other | Attending: Internal Medicine

## 2017-10-27 DIAGNOSIS — I350 Nonrheumatic aortic (valve) stenosis: Secondary | ICD-10-CM | POA: Insufficient documentation

## 2017-11-08 NOTE — Progress Notes (Signed)
HPI: FU aortic stenosis. Placed on beta blocker previously secondary to PVCs. Cardiac catheterization September 2015 showed a 20% left main and no other obstructive disease. Ejection fraction 60-65%. Mild to moderate aortic stenosis.Nuclear study 11/18 showed EF 62, probable shifting breast attenuation. CTA 11/18 showed no pulmonary embolus. Echo 10/19 showed normal LV function; grade 2 DD; moderate AS (mean gradient 32 mmHg) , mild MR, mild LAE. Since last seen,she continues with dyspnea on exertion but no orthopnea, PND, pedal edema, exertional chest pain or syncope.  Current Outpatient Medications  Medication Sig Dispense Refill  . aspirin 81 MG tablet Take 81 mg by mouth daily.    . cholecalciferol (VITAMIN D) 1000 UNITS tablet Take 1,000 Units by mouth daily.      . hydrochlorothiazide (MICROZIDE) 12.5 MG capsule Take 1 capsule (12.5 mg total) by mouth daily. 90 capsule 3  . losartan (COZAAR) 25 MG tablet Take 2 tablets (50 mg total) by mouth daily. 180 tablet 3  . metoprolol succinate (TOPROL-XL) 50 MG 24 hr tablet TAKE 1 TABLET BY MOUTH  DAILY 90 tablet 1  . Misc Natural Products (TART CHERRY ADVANCED PO) Take by mouth daily.    . multivitamin (THERAGRAN) per tablet Take 1 tablet by mouth daily.     . Omega-3 1000 MG CAPS Take 1 capsule by mouth every morning.     Marland Kitchen omeprazole-sodium bicarbonate (ZEGERID) 40-1100 MG per capsule Take 1 capsule by mouth every other day. 1 capsule daily    . rosuvastatin (CRESTOR) 40 MG tablet TAKE 1 TABLET BY MOUTH  DAILY (Patient taking differently: TAKE 1 TABLET BY MOUTH  EVERY OTHER DAY) 90 tablet 2  . vitamin E 400 UNIT capsule Take 400 Units by mouth daily.     No current facility-administered medications for this visit.      Past Medical History:  Diagnosis Date  . Aortic stenosis   . Aortic valve disorder 06/18/2008   Qualifier: Diagnosis of  By: [name], MD, Kandyce Rud   . CAD (coronary artery disease) 03/19/2014  . Chest  pain 06/17/2011  . Colon polyp   . Coronary artery disease    aortic stenosis/mild per ECHO  . Dyspnea   . Essential hypertension 07/21/2006   Qualifier: Diagnosis of  By: Hulan Saas, CMA (AAMA), Quita Skye   . FATIGUE 12/31/2009   Qualifier: Diagnosis of  By: Regis Bill MD, [name]   . Generalized osteoarthrosis, involving multiple sites   . GERD (gastroesophageal reflux disease)    dilitation  . HEART MURMUR, SYSTOLIC 7/37/1062   Qualifier: Diagnosis of  By: Regis Bill MD, [name]   . Hx of colonoscopy with polypectomy 07/14/2012   medoff   . Hyperlipidemia   . HYPERLIPIDEMIA 07/21/2006   Qualifier: Diagnosis of  By: Hulan Saas, CMA (AAMA), Quita Skye   . Hypertension   . Impaired fasting glucose   . Obesity   . Personal history of other diseases of digestive system   . PONV (postoperative nausea and vomiting)   . SOB 07/21/2006   PFT's 2005   C/w classic obesity effects with disporportionate ERV reduction      Past Surgical History:  Procedure Laterality Date  . APPENDECTOMY    . CHOLECYSTECTOMY    . FOOT SURGERY     rt  . KNEE SURGERY     rt  . LEFT AND RIGHT HEART CATHETERIZATION WITH CORONARY ANGIOGRAM N/A 10/10/2013   Procedure: LEFT AND RIGHT HEART CATHETERIZATION WITH CORONARY ANGIOGRAM;  Surgeon:  Burnell Blanks, MD;  Location: [facility] CATH LAB;  Service: Cardiovascular;  Laterality: N/A;  . TONSILLECTOMY    . TOTAL KNEE ARTHROPLASTY  02/01/2011   Procedure: TOTAL KNEE ARTHROPLASTY;  Surgeon: Kerin Salen;  Location: [place];  Service: Orthopedics;  Laterality: Right;  DEPUY/SIGMA    Social History   Socioeconomic History  . Marital status: Married    Spouse name: Not on file  . Number of children: Not on file  . Years of education: Not on file  . Highest education level: Not on file  Occupational History  . Occupation: Retired    Comment: Takes care of [employer]  Social Needs  . Financial resource strain: Not on file  . Food insecurity:    Worry: Not on file     Inability: Not on file  . Transportation needs:    Medical: Not on file    Non-medical: Not on file  Tobacco Use  . Smoking status: Never Smoker  . Smokeless tobacco: Never Used  Substance and Sexual Activity  . Alcohol use: No  . Drug use: No  . Sexual activity: Not on file    Comment: quit 40 yrs ago  Lifestyle  . Physical activity:    Days per week: Not on file    Minutes per session: Not on file  . Stress: Not on file  Relationships  . Social connections:    Talks on phone: Not on file    Gets together: Not on file    Attends religious service: Not on file    Active member of club or organization: Not on file    Attends meetings of clubs or organizations: Not on file    Relationship status: Not on file  . Intimate partner violence:    Fear of current or ex partner: Not on file    Emotionally abused: Not on file    Physically abused: Not on file    Forced sexual activity: Not on file  Other Topics Concern  . Not on file  Social History Narrative   hh of 3 -4  Married   [place] son   At home    Invalid son paraplegia and husband    And grandson recently     No pets   Fluctuating hh membors she cooks and prepares for them           Family History  Problem Relation Age of Onset  . Coronary artery disease Unknown        female- 1st degree relative  . Coronary artery disease Unknown        female- 1st degree relative  . Hypertension Unknown   . Hyperlipidemia Unknown   . Heart attack Mother        age 66's  . Heart attack Father        age 72's  . Stroke Brother        died   . Hearing loss Son   . Sleep apnea Son   . Aortic stenosis Sister   . Stroke Brother   . Heart attack Brother   . Rheum arthritis Sister   . Osteoarthritis Sister   . Arthritis Sister   . Heart Problems Brother     ROS: Back pain but no fevers or chills, productive cough, hemoptysis, dysphasia, odynophagia, melena, hematochezia, dysuria, hematuria, rash, seizure activity, orthopnea,  PND, pedal edema, claudication. Remaining systems are negative.  Physical Exam: Well-developed obese in no acute distress.  Skin is  warm and dry.  HEENT is normal.  Neck is supple.  Chest is clear to auscultation with normal expansion.  Cardiovascular exam is regular rate and rhythm. 2/6 systolic murmur, S2 mildly diminished. Abdominal exam nontender or distended. No masses palpated. Extremities show no edema. neuro grossly intact  ECG-normal sinus rhythm at a rate of 71.  No ST changes.  Personally reviewed  A/P  1 aortic stenosis-moderate on follow-up echocardiogram.  Note she continues to have dyspnea on exertion but has had long standing symptoms.  I have felt that this is multifactorial including deconditioning, obesity hypoventilation syndrome with contribution from aortic stenosis.  I will see her back in 6 months to review her symptoms but I am not convinced she needs aortic valve replacement at this point.  She does understand that she will likely need this in the future.  2 hypertension-blood pressure is controlled.  Continue present medications.  3 coronary artery disease-plan to continue aspirin and statin.  No chest pain.  4 hyperlipidemia-continue statin.  5 morbid obesity-we discussed the importance of weight loss.  Kirk Ruths, MD

## 2017-11-10 ENCOUNTER — Ambulatory Visit: Payer: Medicare Other

## 2017-11-13 NOTE — Progress Notes (Signed)
Chief Complaint  Patient presents with  . Follow-up    A1c check.     HPI: Anna Gamble 82 y.o. come in for Chronic disease management   Blood sugar  Obesity  Stress at home  Has to cook for eery one and chil din house that is disruptive  Cards  AS  Mod sever  Ht lipids and ascvd no sx  Considering  tavr   Sob multifactorial  Foot is limiting .  For activity   ROS: See pertinent positives and negatives per HPI.  Past Medical History:  Diagnosis Date  . Aortic stenosis   . Aortic valve disorder 06/18/2008   Qualifier: Diagnosis of  By: [name], MD, Kandyce Rud   . CAD (coronary artery disease) 03/19/2014  . Chest pain 06/17/2011  . Colon polyp   . Coronary artery disease    aortic stenosis/mild per ECHO  . Dyspnea   . Essential hypertension 07/21/2006   Qualifier: Diagnosis of  By: Hulan Saas, CMA (AAMA), Quita Skye   . FATIGUE 12/31/2009   Qualifier: Diagnosis of  By: Regis Bill MD, [name]   . Generalized osteoarthrosis, involving multiple sites   . GERD (gastroesophageal reflux disease)    dilitation  . HEART MURMUR, SYSTOLIC 3/50/0938   Qualifier: Diagnosis of  By: Regis Bill MD, [name]   . Hx of colonoscopy with polypectomy 07/14/2012   medoff   . Hyperlipidemia   . HYPERLIPIDEMIA 07/21/2006   Qualifier: Diagnosis of  By: Hulan Saas, CMA (AAMA), Quita Skye   . Hypertension   . Impaired fasting glucose   . Obesity   . Personal history of other diseases of digestive system   . PONV (postoperative nausea and vomiting)   . SOB 07/21/2006   PFT's 2005   C/w classic obesity effects with disporportionate ERV reduction      Family History  Problem Relation Age of Onset  . Coronary artery disease Unknown        female- 1st degree relative  . Coronary artery disease Unknown        female- 1st degree relative  . Hypertension Unknown   . Hyperlipidemia Unknown   . Heart attack Mother        age 72's  . Heart attack Father        age 51's  . Stroke Brother        died   .  Hearing loss Son   . Sleep apnea Son   . Aortic stenosis Sister   . Stroke Brother   . Heart attack Brother   . Rheum arthritis Sister   . Osteoarthritis Sister   . Arthritis Sister   . Heart Problems Brother     Social History   Socioeconomic History  . Marital status: Married    Spouse name: Not on file  . Number of children: Not on file  . Years of education: Not on file  . Highest education level: Not on file  Occupational History  . Occupation: Retired    Comment: Takes care of [employer]  Social Needs  . Financial resource strain: Not on file  . Food insecurity:    Worry: Not on file    Inability: Not on file  . Transportation needs:    Medical: Not on file    Non-medical: Not on file  Tobacco Use  . Smoking status: Never Smoker  . Smokeless tobacco: Never Used  Substance and Sexual Activity  . Alcohol use: No  . Drug  use: No  . Sexual activity: Not on file    Comment: quit 40 yrs ago  Lifestyle  . Physical activity:    Days per week: Not on file    Minutes per session: Not on file  . Stress: Not on file  Relationships  . Social connections:    Talks on phone: Not on file    Gets together: Not on file    Attends religious service: Not on file    Active member of club or organization: Not on file    Attends meetings of clubs or organizations: Not on file    Relationship status: Not on file  Other Topics Concern  . Not on file  Social History Narrative   hh of 3 -4  Married   [place] son   At home    Invalid son paraplegia and husband    And grandson recently     No pets   Fluctuating hh membors she cooks and prepares for them           Outpatient Medications Prior to Visit  Medication Sig Dispense Refill  . aspirin 81 MG tablet Take 81 mg by mouth daily.    . cholecalciferol (VITAMIN D) 1000 UNITS tablet Take 1,000 Units by mouth daily.      . hydrochlorothiazide (MICROZIDE) 12.5 MG capsule Take 1 capsule (12.5 mg total) by mouth daily. 90  capsule 3  . losartan (COZAAR) 25 MG tablet Take 2 tablets (50 mg total) by mouth daily. 180 tablet 3  . metoprolol succinate (TOPROL-XL) 50 MG 24 hr tablet TAKE 1 TABLET BY MOUTH  DAILY 90 tablet 1  . Misc Natural Products (TART CHERRY ADVANCED PO) Take by mouth daily.    . multivitamin (THERAGRAN) per tablet Take 1 tablet by mouth daily.     . Omega-3 1000 MG CAPS Take 1 capsule by mouth every morning.     Marland Kitchen omeprazole-sodium bicarbonate (ZEGERID) 40-1100 MG per capsule Take 1 capsule by mouth every other day. 1 capsule daily    . rosuvastatin (CRESTOR) 40 MG tablet TAKE 1 TABLET BY MOUTH  DAILY (Patient taking differently: TAKE 1 TABLET BY MOUTH  EVERY OTHER DAY) 90 tablet 2  . vitamin E 400 UNIT capsule Take 400 Units by mouth daily.     No facility-administered medications prior to visit.      EXAM:  BP 122/88 (BP Location: Right Arm, Patient Position: Sitting, Cuff Size: Large)   Pulse 65   Temp 98.1 F (36.7 C) (Oral)   Wt 247 lb 4.8 oz (112.2 kg)   BMI 45.23 kg/m   Body mass index is 45.23 kg/m.  GENERAL: vitals reviewed and listed above, alert, oriented, appears well hydrated and in no acute distress HEENT: atraumatic, conjunctiva  clear, no obvious abnormalities on inspection of external nose and ears LUNGS: clear to auscultation bilaterally, no wheezes, rales or rhonchi, good air movement CV: HRRR,2/6 sem usb  no clubbing cyanosis or  peripheral edema nl cap refill  Ambulatory   Antalgic  PSYCH: pleasant and cooperative, cognition intact   Lab Results  Component Value Date   WBC 7.8 07/04/2017   HGB 14.0 07/04/2017   HCT 41.2 07/04/2017   PLT 290.0 07/04/2017   GLUCOSE 115 (H) 07/04/2017   CHOL 201 (H) 07/04/2017   TRIG 150.0 (H) 07/04/2017   HDL 69.50 07/04/2017   LDLDIRECT 89.0 06/15/2016   LDLCALC 102 (H) 07/04/2017   ALT 25 07/04/2017   AST 18 07/04/2017  NA 140 07/04/2017   K 4.7 07/04/2017   CL 100 07/04/2017   CREATININE 0.88 07/04/2017   BUN 17  07/04/2017   CO2 32 07/04/2017   TSH 1.05 04/16/2015   INR 0.93 10/08/2013   HGBA1C 6.5 (A) 11/14/2017   BP Readings from Last 3 Encounters:  11/15/17 116/82  11/14/17 122/88  09/26/17 118/78    ASSESSMENT AND PLAN:  Discussed the following assessment and plan:  Fasting hyperglycemia  Medication management - Plan: POC HgB A1c  Need for influenza vaccination - Plan: Flu vaccine HIGH DOSE PF (Fluzone High dose)  Aortic valve disorder  Morbid obesity (HCC) Weight loss  Advised      a1c stable and a bit better       Total visit 5mins > 50% spent counseling and coordinating care as indicated in above note and in instructions to patient .   Stress and measures for healthy weight decrease Stressful situation  At home  Disc   Support   And how to  To take measures for her own health .... -Patient advised to return or notify health care team  if  new concerns arise.  Patient Instructions  Important   to lose weight.  For your health and if you need  Heart surgery . For the valve .   ROV in 4-6 months or as  Indicated .        [name]. Ceylon Arenson M.D.

## 2017-11-14 ENCOUNTER — Encounter: Payer: Self-pay | Admitting: Internal Medicine

## 2017-11-14 ENCOUNTER — Ambulatory Visit (INDEPENDENT_AMBULATORY_CARE_PROVIDER_SITE_OTHER): Payer: Medicare Other | Admitting: Internal Medicine

## 2017-11-14 VITALS — BP 122/88 | HR 65 | Temp 98.1°F | Wt 247.3 lb

## 2017-11-14 DIAGNOSIS — Z23 Encounter for immunization: Secondary | ICD-10-CM | POA: Diagnosis not present

## 2017-11-14 DIAGNOSIS — Z79899 Other long term (current) drug therapy: Secondary | ICD-10-CM | POA: Diagnosis not present

## 2017-11-14 DIAGNOSIS — I2510 Atherosclerotic heart disease of native coronary artery without angina pectoris: Secondary | ICD-10-CM | POA: Diagnosis not present

## 2017-11-14 DIAGNOSIS — R7301 Impaired fasting glucose: Secondary | ICD-10-CM

## 2017-11-14 DIAGNOSIS — I359 Nonrheumatic aortic valve disorder, unspecified: Secondary | ICD-10-CM

## 2017-11-14 DIAGNOSIS — I2583 Coronary atherosclerosis due to lipid rich plaque: Secondary | ICD-10-CM

## 2017-11-14 LAB — POCT GLYCOSYLATED HEMOGLOBIN (HGB A1C): Hemoglobin A1C: 6.5 % — AB (ref 4.0–5.6)

## 2017-11-14 NOTE — Patient Instructions (Addendum)
Important   to lose weight.  For your health and if you need  Heart surgery . For the valve .   ROV in 4-6 months or as  Indicated .

## 2017-11-15 ENCOUNTER — Ambulatory Visit (INDEPENDENT_AMBULATORY_CARE_PROVIDER_SITE_OTHER): Payer: Medicare Other | Admitting: Cardiology

## 2017-11-15 ENCOUNTER — Ambulatory Visit: Payer: Medicare Other

## 2017-11-15 ENCOUNTER — Encounter: Payer: Self-pay | Admitting: Cardiology

## 2017-11-15 VITALS — BP 116/82 | HR 71 | Resp 16 | Ht 63.0 in | Wt 248.0 lb

## 2017-11-15 DIAGNOSIS — I2583 Coronary atherosclerosis due to lipid rich plaque: Secondary | ICD-10-CM

## 2017-11-15 DIAGNOSIS — E7800 Pure hypercholesterolemia, unspecified: Secondary | ICD-10-CM

## 2017-11-15 DIAGNOSIS — I2510 Atherosclerotic heart disease of native coronary artery without angina pectoris: Secondary | ICD-10-CM

## 2017-11-15 DIAGNOSIS — I10 Essential (primary) hypertension: Secondary | ICD-10-CM | POA: Diagnosis not present

## 2017-11-15 DIAGNOSIS — I350 Nonrheumatic aortic (valve) stenosis: Secondary | ICD-10-CM | POA: Diagnosis not present

## 2017-11-15 NOTE — Patient Instructions (Signed)

## 2017-12-06 DIAGNOSIS — B0052 Herpesviral keratitis: Secondary | ICD-10-CM | POA: Diagnosis not present

## 2017-12-21 DIAGNOSIS — H00022 Hordeolum internum right lower eyelid: Secondary | ICD-10-CM | POA: Diagnosis not present

## 2018-01-04 ENCOUNTER — Other Ambulatory Visit: Payer: Self-pay | Admitting: Cardiology

## 2018-02-23 ENCOUNTER — Ambulatory Visit: Payer: Medicare Other | Admitting: Internal Medicine

## 2018-02-24 NOTE — Progress Notes (Signed)
Chief Complaint  Patient presents with  . Follow-up    Pt states that she is doing well besides still having a cough that is starting to ger better     HPI: Anna Gamble 83 y.o. come in for Chronic disease management  Has had a cold  . Still cough no fever  Has had this for into 3 week.   Getting better  Husband had it first  Main problems are Morbid obesity sig AV disease and  elevaetd  bg   No fainting  Dyspnea about the same as  Base;line   Not able to  Move much to be able to lose weight   ROS: See pertinent positives and negatives per HPI. No fever   cp  Past Medical History:  Diagnosis Date  . Aortic stenosis   . Aortic valve disorder 06/18/2008   Qualifier: Diagnosis of  By: [name], MD, Kandyce Rud   . CAD (coronary artery disease) 03/19/2014  . Chest pain 06/17/2011  . Colon polyp   . Coronary artery disease    aortic stenosis/mild per ECHO  . Dyspnea   . Essential hypertension 07/21/2006   Qualifier: Diagnosis of  By: Hulan Saas, CMA (AAMA), Quita Skye   . FATIGUE 12/31/2009   Qualifier: Diagnosis of  By: Regis Bill MD, [name]   . Generalized osteoarthrosis, involving multiple sites   . GERD (gastroesophageal reflux disease)    dilitation  . HEART MURMUR, SYSTOLIC 05/26/3265   Qualifier: Diagnosis of  By: Regis Bill MD, [name]   . Hx of colonoscopy with polypectomy 07/14/2012   medoff   . Hyperlipidemia   . HYPERLIPIDEMIA 07/21/2006   Qualifier: Diagnosis of  By: Hulan Saas, CMA (AAMA), Quita Skye   . Hypertension   . Impaired fasting glucose   . Obesity   . Personal history of other diseases of digestive system   . PONV (postoperative nausea and vomiting)   . SOB 07/21/2006   PFT's 2005   C/w classic obesity effects with disporportionate ERV reduction      Family History  Problem Relation Age of Onset  . Coronary artery disease Unknown        female- 1st degree relative  . Coronary artery disease Unknown        female- 1st degree relative  . Hypertension  Unknown   . Hyperlipidemia Unknown   . Heart attack Mother        age 83's  . Heart attack Father        age 27's  . Stroke Brother        died   . Hearing loss Son   . Sleep apnea Son   . Aortic stenosis Sister   . Stroke Brother   . Heart attack Brother   . Rheum arthritis Sister   . Osteoarthritis Sister   . Arthritis Sister   . Heart Problems Brother     Social History   Socioeconomic History  . Marital status: Married    Spouse name: Not on file  . Number of children: Not on file  . Years of education: Not on file  . Highest education level: Not on file  Occupational History  . Occupation: Retired    Comment: Takes care of [employer]  Social Needs  . Financial resource strain: Not on file  . Food insecurity:    Worry: Not on file    Inability: Not on file  . Transportation needs:    Medical: Not on file  Non-medical: Not on file  Tobacco Use  . Smoking status: Never Smoker  . Smokeless tobacco: Never Used  Substance and Sexual Activity  . Alcohol use: No  . Drug use: No  . Sexual activity: Not on file    Comment: quit 40 yrs ago  Lifestyle  . Physical activity:    Days per week: Not on file    Minutes per session: Not on file  . Stress: Not on file  Relationships  . Social connections:    Talks on phone: Not on file    Gets together: Not on file    Attends religious service: Not on file    Active member of club or organization: Not on file    Attends meetings of clubs or organizations: Not on file    Relationship status: Not on file  Other Topics Concern  . Not on file  Social History Narrative   hh of 3 -4  Married   [place] son   At home    Invalid son paraplegia and husband    And grandson recently     No pets   Fluctuating hh membors she cooks and prepares for them           Outpatient Medications Prior to Visit  Medication Sig Dispense Refill  . aspirin 81 MG tablet Take 81 mg by mouth daily.    . cholecalciferol (VITAMIN D)  1000 UNITS tablet Take 1,000 Units by mouth daily.      . metoprolol succinate (TOPROL-XL) 50 MG 24 hr tablet TAKE 1 TABLET BY MOUTH  DAILY 90 tablet 1  . Misc Natural Products (TART CHERRY ADVANCED PO) Take by mouth daily.    . multivitamin (THERAGRAN) per tablet Take 1 tablet by mouth daily.     . Omega-3 1000 MG CAPS Take 1 capsule by mouth every morning.     Marland Kitchen omeprazole-sodium bicarbonate (ZEGERID) 40-1100 MG per capsule Take 1 capsule by mouth every other day. 1 capsule daily    . rosuvastatin (CRESTOR) 40 MG tablet Take 1 tablet (40 mg total) by mouth every other day. 90 tablet 1  . vitamin E 400 UNIT capsule Take 400 Units by mouth daily.    . hydrochlorothiazide (MICROZIDE) 12.5 MG capsule Take 1 capsule (12.5 mg total) by mouth daily. 90 capsule 3  . losartan (COZAAR) 25 MG tablet Take 2 tablets (50 mg total) by mouth daily. 180 tablet 3   No facility-administered medications prior to visit.      EXAM:  BP 126/82 (BP Location: Right Arm, Patient Position: Sitting, Cuff Size: Large)   Pulse 87   Temp 98.7 F (37.1 C) (Oral)   Wt 247 lb 12.8 oz (112.4 kg)   BMI 43.90 kg/m   Body mass index is 43.9 kg/m.  GENERAL: vitals reviewed and listed above, alert, oriented, appears well hydrated and in no acute distress HEENT: atraumatic, conjunctiva  clear, no obvious abnormalities on inspection of external nose and ears NECK: no obvious masses on inspection palpation  LUNGS: clear to auscultation bilaterally, no wheezes, rales or rhonchi, good air movement CV: HRRR 32/6em , no clubbing cyanosis trc to 1  peripheral edema nl cap refill  MS: moves all extremities without noticeable focal  Abnormality wallks witht a cane  Ambulatory  PSYCH: pleasant and cooperative, no obvious depression or anxiety Lab Results  Component Value Date   WBC 7.8 07/04/2017   HGB 14.0 07/04/2017   HCT 41.2 07/04/2017   PLT 290.0 07/04/2017  GLUCOSE 115 (H) 07/04/2017   CHOL 201 (H) 07/04/2017    TRIG 150.0 (H) 07/04/2017   HDL 69.50 07/04/2017   LDLDIRECT 89.0 06/15/2016   LDLCALC 102 (H) 07/04/2017   ALT 25 07/04/2017   AST 18 07/04/2017   NA 140 07/04/2017   K 4.7 07/04/2017   CL 100 07/04/2017   CREATININE 0.88 07/04/2017   BUN 17 07/04/2017   CO2 32 07/04/2017   TSH 1.05 04/16/2015   INR 0.93 10/08/2013   HGBA1C 6.7 (A) 02/27/2018   BP Readings from Last 3 Encounters:  02/27/18 126/82  11/15/17 116/82  11/14/17 122/88   Wt Readings from Last 3 Encounters:  02/27/18 247 lb 12.8 oz (112.4 kg)  11/15/17 248 lb (112.5 kg)  11/14/17 247 lb 4.8 oz (112.2 kg)     ASSESSMENT AND PLAN:  Discussed the following assessment and plan:  Fasting hyperglycemia early dm by a1c  - counseled about diet and inteventions  has bvarriers to activtyu used to do water exercise - Plan: POCT glycosylated hemoglobin (Hb A1C)  Morbid obesity (HCC) - w comorbidity   Aortic valve stenosis, etiology of cardiac valve disease unspecified  GEN OSTEOARTHROSIS INVOLVING MULTIPLE SITES  Coronary artery disease involving native coronary artery of native heart without angina pectoris Resolving  resp infection  No complications as of exam today  Total visit 79mins > 50% spent counseling and coordinating care as indicated in above note and in instructions to patient .   -Patient advised to return or notify health care team  if  new concerns arise.  Patient Instructions  Your sugar is stable in th early diabetic range.  Would like you to lose  5  Or more  pounds int he next 2-3  Months  This will help  Your sugars .   ROV in 3-4 months  Or as needed .  [name]. [name] M.D.

## 2018-02-27 ENCOUNTER — Ambulatory Visit (INDEPENDENT_AMBULATORY_CARE_PROVIDER_SITE_OTHER): Payer: Medicare Other | Admitting: Internal Medicine

## 2018-02-27 ENCOUNTER — Encounter: Payer: Self-pay | Admitting: Internal Medicine

## 2018-02-27 VITALS — BP 126/82 | HR 87 | Temp 98.7°F | Wt 247.8 lb

## 2018-02-27 DIAGNOSIS — I2510 Atherosclerotic heart disease of native coronary artery without angina pectoris: Secondary | ICD-10-CM | POA: Diagnosis not present

## 2018-02-27 DIAGNOSIS — M159 Polyosteoarthritis, unspecified: Secondary | ICD-10-CM

## 2018-02-27 DIAGNOSIS — R7301 Impaired fasting glucose: Secondary | ICD-10-CM | POA: Diagnosis not present

## 2018-02-27 DIAGNOSIS — I350 Nonrheumatic aortic (valve) stenosis: Secondary | ICD-10-CM | POA: Diagnosis not present

## 2018-02-27 LAB — POCT GLYCOSYLATED HEMOGLOBIN (HGB A1C): Hemoglobin A1C: 6.7 % — AB (ref 4.0–5.6)

## 2018-02-27 NOTE — Patient Instructions (Addendum)
Your sugar is stable in th early diabetic range.  Would like you to lose  5  Or more  pounds int he next 2-3  Months  This will help  Your sugars .   ROV in 3-4 months  Or as needed .

## 2018-03-10 ENCOUNTER — Ambulatory Visit (INDEPENDENT_AMBULATORY_CARE_PROVIDER_SITE_OTHER): Payer: Medicare Other

## 2018-03-10 VITALS — BP 124/86 | HR 68 | Temp 98.5°F | Resp 20 | Ht 63.0 in | Wt 245.0 lb

## 2018-03-10 DIAGNOSIS — Z0000 Encounter for general adult medical examination without abnormal findings: Secondary | ICD-10-CM

## 2018-03-10 DIAGNOSIS — Z780 Asymptomatic menopausal state: Secondary | ICD-10-CM

## 2018-03-10 DIAGNOSIS — Z13820 Encounter for screening for osteoporosis: Secondary | ICD-10-CM | POA: Diagnosis not present

## 2018-03-10 NOTE — Progress Notes (Addendum)
Subjective:   Anna Gamble is a 83 y.o. female who presents for Medicare Annual (Subsequent) preventive examination.  Review of Systems:  No ROS.  Medicare Wellness Visit. Additional risk factors are reflected in the social history.  Cardiac Risk Factors include: advanced age (>56men, >16 women);hypertension;sedentary lifestyle;obesity (BMI >30kg/m2) Sleep patterns: has restless sleep and gets up 1-2 times nightly to void. Pt. States she feels like she is overall well-rested. Occasionally has urine and bowel incontinence. Utility of kegel exercises reviewed, education provided.   Home Safety/Smoke Alarms: Feels safe in home. Smoke alarms in place.  Living environment; residence and Firearm Safety: 1-story house/ trailer, ADA compliant. Uses cane for gait stability, grab bars in bathroom.  Seat Belt Safety/Bike Helmet: Wears seat belt.   Female:   Pap- N/A d/t age. Pt. Expressed concern over family hx cervical CA, but wanted to wait on any screenings until after cardiac surgery.       Mammo- 09/2017, due 09/2018, pt. Made aware.      Dexa scan- 06/2016, due 06/2018. Order placed.     CCS- 04/2012, not recommended per Dr. Earlean Shawl d/t heart condition, unable to receive anesthesia.      Objective:     Vitals: BP 124/86 (BP Location: Right Arm, Patient Position: Sitting, Cuff Size: Large)   Pulse 68   Temp 98.5 F (36.9 C) (Oral)   Resp 20   Ht 5\' 3"  (1.6 m)   Wt 245 lb (111.1 kg)   SpO2 95%   BMI 43.40 kg/m   Body mass index is 43.4 kg/m.  Advanced Directives 03/10/2018 11/09/2016 06/17/2016 10/10/2013 01/28/2011  Does Patient Have a Medical Advance Directive? Yes Yes No No Patient does not have advance directive;Patient would not like information  Type of Scientist, forensic Power of [place];Living will - - - -  Does patient want to make changes to medical advance directive? No - Patient declined - - - -  Copy of [place] in Chart? No - copy requested  - - - -  Would patient like information on creating a medical advance directive? - - No - Patient declined No - patient declined information -    Tobacco Social History   Tobacco Use  Smoking Status Never Smoker  Smokeless Tobacco Never Used     Counseling given: Not Answered   Past Medical History:  Diagnosis Date  . Aortic stenosis   . Aortic valve disorder 06/18/2008   Qualifier: Diagnosis of  By: [name], MD, Kandyce Rud   . CAD (coronary artery disease) 03/19/2014  . Chest pain 06/17/2011  . Colon polyp   . Coronary artery disease    aortic stenosis/mild per ECHO  . Dyspnea   . Essential hypertension 07/21/2006   Qualifier: Diagnosis of  By: Hulan Saas, CMA (AAMA), Quita Skye   . FATIGUE 12/31/2009   Qualifier: Diagnosis of  By: Regis Bill MD, [name]   . Generalized osteoarthrosis, involving multiple sites   . GERD (gastroesophageal reflux disease)    dilitation  . HEART MURMUR, SYSTOLIC 4/40/1027   Qualifier: Diagnosis of  By: Regis Bill MD, [name]   . Hx of colonoscopy with polypectomy 07/14/2012   medoff   . Hyperlipidemia   . HYPERLIPIDEMIA 07/21/2006   Qualifier: Diagnosis of  By: Hulan Saas, CMA (AAMA), Quita Skye   . Hypertension   . Impaired fasting glucose   . Obesity   . Personal history of other diseases of digestive system   . PONV (postoperative  nausea and vomiting)   . SOB 07/21/2006   PFT's 2005   C/w classic obesity effects with disporportionate ERV reduction     Past Surgical History:  Procedure Laterality Date  . APPENDECTOMY    . CHOLECYSTECTOMY    . FOOT SURGERY     rt  . KNEE SURGERY     rt  . LEFT AND RIGHT HEART CATHETERIZATION WITH CORONARY ANGIOGRAM N/A 10/10/2013   Procedure: LEFT AND RIGHT HEART CATHETERIZATION WITH CORONARY ANGIOGRAM;  Surgeon: Burnell Blanks, MD;  Location: [facility] CATH LAB;  Service: Cardiovascular;  Laterality: N/A;  . TONSILLECTOMY    . TOTAL KNEE ARTHROPLASTY  02/01/2011   Procedure: TOTAL KNEE ARTHROPLASTY;   Surgeon: Kerin Salen;  Location: [place];  Service: Orthopedics;  Laterality: Right;  DEPUY/SIGMA   Family History  Problem Relation Age of Onset  . Coronary artery disease Other        female- 1st degree relative  . Coronary artery disease Other        female- 1st degree relative  . Hypertension Other   . Hyperlipidemia Other   . Heart attack Mother        age 46's  . Heart attack Father        age 78's  . Stroke Brother        died   . Hearing loss Son   . Sleep apnea Son   . Aortic stenosis Sister   . Stroke Brother   . Heart attack Brother   . Rheum arthritis Sister   . Osteoarthritis Sister   . Arthritis Sister   . Heart Problems Brother    Social History   Socioeconomic History  . Marital status: Married    Spouse name: Not on file  . Number of children: 4  . Years of education: Not on file  . Highest education level: Not on file  Occupational History  . Occupation: Retired    Comment: Takes care of [employer]  . Occupation: Therapist, sports, peds, cardiology    Comment: retired  Scientific laboratory technician  . Financial resource strain: Not hard at all  . Food insecurity:    Worry: Never true    Inability: Never true  . Transportation needs:    Medical: No    Non-medical: No  Tobacco Use  . Smoking status: Never Smoker  . Smokeless tobacco: Never Used  Substance and Sexual Activity  . Alcohol use: No  . Drug use: No  . Sexual activity: Not Currently    Comment: quit 40 yrs ago  Lifestyle  . Physical activity:    Days per week: 0 days    Minutes per session: 0 min  . Stress: Only a little  Relationships  . Social connections:    Talks on phone: More than three times a week    Gets together: Once a week    Attends religious service: More than 4 times per year    Active member of club or organization: Yes    Attends meetings of clubs or organizations: More than 4 times per year    Relationship status: Married  Other Topics Concern  . Not on file  Social History  Narrative   hh of 3 -4  Married   [place] son   At home    Invalid son paraplegia and husband    And grandson recently     No pets   Fluctuating hh membors she cooks and prepares for them  Outpatient Encounter Medications as of 03/10/2018  Medication Sig  . aspirin 81 MG tablet Take 81 mg by mouth daily.  . cholecalciferol (VITAMIN D) 1000 UNITS tablet Take 1,000 Units by mouth daily.    . hydrochlorothiazide (MICROZIDE) 12.5 MG capsule Take 1 capsule (12.5 mg total) by mouth daily.  Marland Kitchen losartan (COZAAR) 25 MG tablet Take 2 tablets (50 mg total) by mouth daily.  . metoprolol succinate (TOPROL-XL) 50 MG 24 hr tablet TAKE 1 TABLET BY MOUTH  DAILY  . Misc Natural Products (TART CHERRY ADVANCED PO) Take by mouth daily.  . multivitamin (THERAGRAN) per tablet Take 1 tablet by mouth daily.   . Omega-3 1000 MG CAPS Take 1 capsule by mouth every morning.   Marland Kitchen omeprazole-sodium bicarbonate (ZEGERID) 40-1100 MG per capsule Take 1 capsule by mouth every other day. 1 capsule daily  . Respiratory Therapy Supplies (NEBULIZER) DEVI by Does not apply route.  . rosuvastatin (CRESTOR) 40 MG tablet Take 1 tablet (40 mg total) by mouth every other day.  . vitamin E 400 UNIT capsule Take 400 Units by mouth daily.   No facility-administered encounter medications on file as of 03/10/2018.     Activities of Daily Living In your present state of health, do you have any difficulty performing the following activities: 03/10/2018  Hearing? Y  Comment but refuses audiology resoucrces at this time  Vision? Y  Comment R eye issues, but L eye corrects for it. Seeing eye doctor soon per pt.   Difficulty concentrating or making decisions? N  Walking or climbing stairs? Y  Dressing or bathing? N  Doing errands, shopping? N  Preparing Food and eating ? N  Using the Toilet? N  In the past six months, have you accidently leaked urine? Y  Comment kegel exercise information provided  Do you have problems with loss  of bowel control? Y  Comment occassionally. Has been seen by GI doctor for this. Not a major concern for pt. at this time though.  Managing your Medications? N  Managing your Finances? N  Housekeeping or managing your Housekeeping? N  Some recent data might be hidden    Patient Care Team: [name], [name], MD as PCP - General Tanda Rockers, MD (Pulmonary Disease) Almedia Balls, MD (Orthopedic Surgery) Richmond Campbell, MD (Gastroenterology) Lelon Perla, MD as Attending Physician (Cardiology) Marica Otter, OD (Optometry)    Assessment:   This is a routine wellness examination for Piedad. Physical assessment deferred to PCP.   Exercise Activities and Dietary recommendations Current Exercise Habits: The patient does not participate in regular exercise at present, Exercise limited by: respiratory conditions(s);cardiac condition(s);orthopedic condition(s). Pt. Dyspneic with exertion, has appointment with [name] in April. Author advised to try to start doing some routine chair exercises in hopes of building endurance and make her a better candidate for pending heart surgery, as she has severe aortic stenosis. Importance of breathing right during exertion reviewed with pt.   Diet (meal preparation, eat out, water intake, caffeinated beverages, dairy products, fruits and vegetables): eats oatmeal or eggs in the AM, progresso soup for lunch, diet sodas, sometimes skips dinner. If does eat dinner, has meat and vegetables, has been trying to cut down on carbohydrates. Author encouraged practice and advised to limit salt intake as well.        Goals    . patient     Wants to have more energy Wants to be more active and feels this would help her weight loss;  feels you could get 25 lbs [facility]  Will get on recumbent bike at home around lunch time Will start doing a weight plan; light weight; low as tolerated       . Patient Stated     Have  heart surgery, and start doing weights again! Consider doing chair exercises in the meantime       Fall Risk Fall Risk  03/10/2018 02/27/2018 11/09/2016 06/15/2016 04/16/2015  Falls in the past year? 1 1 No Yes No  Comment - - fell in garden x 83 yo and broke her hand - -  Number falls in past yr: 0 0 - 1 -  Injury with Fall? 1 0 - Yes -  Risk for fall due to : History of fall(s);Impaired balance/gait;Impaired mobility;Impaired vision - Other (Comment) - -  Risk for fall due to: Comment - - obesity - -  Follow up Falls prevention discussed;Education provided - Education provided - -     Depression Screen PHQ 2/9 Scores 03/10/2018 02/27/2018 11/09/2016 06/15/2016  PHQ - 2 Score 0 0 0 0  PHQ- 9 Score 2 - - -    Pt. States living with her grandson who has some anger management issues is sometimes challenging, but she feels safe, and does not wish to have any additional resources to help her meet her needs and the needs of her husband, paraplegic son, or grandson, all of whom live with her. "We all help each other out".   Cognitive Function Ad8 score reviewed for issues:  Issues making decisions: no  Less interest in hobbies / activities: no  Repeats questions, stories (family complaining): no  Trouble using ordinary gadgets (microwave, computer, phone):no  Forgets the month or year: no  Mismanaging finances: no  Remembering appts: no  Daily problems with thinking and/or memory: no Ad8 score is= 0        6CIT Screen 11/09/2016  What Year? 0 points  What month? 0 points  What time? 0 points  Count back from 20 0 points  Months in reverse 0 points  Repeat phrase 0 points  Total Score 0    Immunization History  Administered Date(s) Administered  . Influenza Split 09/18/2012  . Influenza Whole 12/27/2006, 11/01/2007, 11/08/2008, 10/29/2009, 12/02/2011  . Influenza, High Dose Seasonal PF 10/27/2016, 11/14/2017  . Influenza-Unspecified 12/19/2014  . Pneumococcal  Conjugate-13 01/15/2013  . Pneumococcal Polysaccharide-23 01/13/2002, 04/16/2015  . Td 03/18/1996, 12/31/2009    Qualifies for Shingles Vaccine? Yes, but pt. refuses.   Screening Tests Health Maintenance  Topic Date Due  . MAMMOGRAM  09/21/2018  . TETANUS/TDAP  01/01/2020  . INFLUENZA VACCINE  Completed  . DEXA SCAN  Completed  . PNA vac Low Risk Adult  Completed        Plan:     Bring a copy of your living will and/or healthcare power of attorney to your next office visit.  Refer to chair and kegel exercises to help with stamina, endurance, especially prior to probable heart surgery.   Monitor breathing while exercising--exhaling during exertion. See breathing exercises provided.  Let us know if/when you want to look into cervical cancer screening.  DEXA scan order placed for 06/2018 timeframe.  I have personally reviewed and noted the following in the patient's chart:   . Medical and social history . Use of alcohol, tobacco or illicit drugs  . Current medications and supplements . Functional ability and status . Nutritional status .  Physical activity . Advanced directives . List of other physicians . Vitals . Screenings to include cognitive, depression, and falls . Referrals and appointments  In addition, I have reviewed and discussed with patient certain preventive protocols, quality metrics, and best practice recommendations. A written personalized care plan for preventive services as well as general preventive health recommendations were provided to patient.     Alphia Moh, RN  03/10/2018    Above noted reviewed and agree. Shanon Ace, MD

## 2018-03-10 NOTE — Patient Instructions (Addendum)
Bring a copy of your living will and/or healthcare power of attorney to your next office visit.  Refer to chair and kegel exercises to help with stamina, endurance, especially prior to probably heart surgery.   Monitor breathing while exercising--exhaling during exertion. See breathing exercises provided.  Let us know if/when you want to look into cervical cancer screening.  Great meeting you today!    Anna Gamble , Thank you for taking time to come for your Medicare Wellness Visit. I appreciate your ongoing commitment to your health goals. Please review the following plan we discussed and let me know if I can assist you in the future.   These are the goals we discussed: Goals    . patient     Wants to have more energy Wants to be more active and feels this would help her weight loss; feels you could get 25 lbs off [facility]  Will get on recumbent bike at home around lunch time Will start doing a weight plan; light weight; low as tolerated       . Patient Stated     Have heart surgery, and start doing weights again! Consider doing chair exercises in the meantime       This is a list of the screening recommended for you and due dates:  Health Maintenance  Topic Date Due  . Mammogram  09/21/2018  . Tetanus Vaccine  01/01/2020  . Flu Shot  Completed  . DEXA scan (bone density measurement)  Completed  . Pneumonia vaccines  Completed    Kegel Exercises Kegel exercises help strengthen the muscles that support the rectum, vagina, small intestine, bladder, and uterus. Doing Kegel exercises can help:  Improve bladder and bowel control.  Improve sexual response.  Reduce problems and discomfort during pregnancy. Kegel exercises involve squeezing your pelvic floor muscles, which are the same muscles you squeeze when you try to stop the flow of urine. The exercises can be done while sitting, standing, or lying down, but it is best to vary your  position. Exercises 1. Squeeze your pelvic floor muscles tight. You should feel a tight lift in your rectal area. If you are a female, you should also feel a tightness in your vaginal area. Keep your stomach, buttocks, and legs relaxed. 2. Hold the muscles tight for up to 10 seconds. 3. Relax your muscles. Repeat this exercise 50 times a day or as many times as told by your health care provider. Continue to do this exercise for at least 4-6 weeks or for as long as told by your health care provider. This information is not intended to replace advice given to you by your health care provider. Make sure you discuss any questions you have with your health care provider. Document Released: 12/22/2011 Document Revised: 05/17/2016 Document Reviewed: 11/24/2014 Elsevier Interactive Patient Education  2019 [employer]   Exercises To Do While Sitting  Exercises that you do while sitting (chair exercises) can give you many of the same benefits as full exercise. Benefits include strengthening your heart, burning calories, and keeping muscles and joints healthy. Exercise can also improve your mood and help with depression and anxiety. You may benefit from chair exercises if you are unable to do standing exercises because of:  Diabetic foot pain.  Obesity.  Illness.  Arthritis.  Recovery from surgery or injury.  Breathing problems.  Balance problems.  Another type of disability. Before starting chair exercises, check with your health   care provider or a physical therapist to find out how much exercise you can tolerate and which exercises are safe for you. If your health care provider approves:  Start out slowly and build up over time. Aim to work up to about 10-20 minutes for each exercise session.  Make exercise part of your daily routine.  Drink water when you exercise. Do not wait until you are thirsty. Drink every 10-15 minutes.  Stop exercising right away if you have pain, nausea,  shortness of breath, or dizziness.  If you are exercising in a wheelchair, make sure to lock the wheels.  Ask your health care provider whether you can do tai chi or yoga. Many positions in these mind-body exercises can be modified to do while seated. Warm-up Before starting other exercises: 1. Sit up as straight as you can. Have your knees bent at 90 degrees, which is the shape of the capital letter "L." Keep your feet flat on the floor. 2. Sit at the front edge of your chair, if you can. 3. Pull in (tighten) the muscles in your abdomen and stretch your spine and neck as straight as you can. Hold this position for a few minutes. 4. Breathe in and out evenly. Try to concentrate on your breathing, and relax your mind. Stretching Exercise A: Arm stretch 1. Hold your arms out straight in front of your body. 2. Bend your hands at the wrist with your fingers pointing up, as if signaling someone to stop. Notice the slight tension in your forearms as you hold the position. 3. Keeping your arms out and your hands bent, rotate your hands outward as far as you can and hold this stretch. Aim to have your thumbs pointing up and your pinkie fingers pointing down. Slowly repeat arm stretches for one minute as tolerated. Exercise B: Leg stretch 1. If you can move your legs, try to "draw" letters on the floor with the toes of your foot. Write your name with one foot. 2. Write your name with the toes of your other foot. Slowly repeat the movements for one minute as tolerated. Exercise C: Reach for the sky 1. Reach your hands as far over your head as you can to stretch your spine. 2. Move your hands and arms as if you are climbing a rope. Slowly repeat the movements for one minute as tolerated. Range of motion exercises Exercise A: Shoulder roll 1. Let your arms hang loosely at your sides. 2. Lift just your shoulders up toward your ears, then let them relax back down. 3. When your shoulders feel loose,  rotate your shoulders in backward and forward circles. Do shoulder rolls slowly for one minute as tolerated. Exercise B: March in place 1. As if you are marching, pump your arms and lift your legs up and down. Lift your knees as high as you can. ? If you are unable to lift your knees, just pump your arms and move your ankles and feet up and down. March in place for one minute as tolerated. Exercise C: Seated jumping jacks 1. Let your arms hang down straight. 2. Keeping your arms straight, lift them up over your head. Aim to point your fingers to the ceiling. 3. While you lift your arms, straighten your legs and slide your heels along the floor to your sides, as wide as you can. 4. As you bring your arms back down to your sides, slide your legs back together. ? If you are unable to use your   legs, just move your arms. Slowly repeat seated jumping jacks for one minute as tolerated. Strengthening exercises Exercise A: Shoulder squeeze 1. Hold your arms straight out from your body to your sides, with your elbows bent and your fists pointed at the ceiling. 2. Keeping your arms in the bent position, move them forward so your elbows and forearms meet in front of your face. 3. Open your arms back out as wide as you can with your elbows still bent, until you feel your shoulder blades squeezing together. Hold for 5 seconds. Slowly repeat the movements forward and backward for one minute as tolerated. Contact a health care provider if you:  Had to stop exercising due to any of the following: ? Pain. ? Nausea. ? Shortness of breath. ? Dizziness. ? Fatigue.  Have significant pain or soreness after exercising. Get help right away if you have:  Chest pain.  Difficulty breathing. These symptoms may represent a serious problem that is an emergency. Do not wait to see if the symptoms will go away. Get medical help right away. Call your local emergency services (911 in the [place] Do not drive yourself  to the hospital. This information is not intended to replace advice given to you by your health care provider. Make sure you discuss any questions you have with your health care provider. Document Released: 11/17/2016 Document Revised: 11/17/2016 Document Reviewed: 11/17/2016 Elsevier Interactive Patient Education  2019 [employer]  Fall Prevention in the Home, Adult Falls can cause injuries. They can happen to people of all ages. There are many things you can do to make your home safe and to help prevent falls. Ask for help when making these changes, if needed. What actions can I take to prevent falls? General Instructions  Use good lighting in all rooms. Replace any light bulbs that burn out.  Turn on the lights when you go into a dark area. Use night-lights.  Keep items that you use often in easy-to-reach places. Lower the shelves around your home if necessary.  Set up your furniture so you have a clear path. Avoid moving your furniture around.  Do not have throw rugs and other things on the floor that can make you trip.  Avoid walking on wet floors.  If any of your floors are uneven, fix them.  Add color or contrast paint or tape to clearly mark and help you see: ? Any grab bars or handrails. ? First and last steps of stairways. ? Where the edge of each step is.  If you use a stepladder: ? Make sure that it is fully opened. Do not climb a closed stepladder. ? Make sure that both sides of the stepladder are locked into place. ? Ask someone to hold the stepladder for you while you use it.  If there are any pets around you, be aware of where they are. What can I do in the bathroom?      Keep the floor dry. Clean up any water that spills onto the floor as soon as it happens.  Remove soap buildup in the tub or shower regularly.  Use non-skid mats or decals on the floor of the tub or shower.  Attach bath mats securely with double-sided, non-slip rug tape.  If you need  to sit down in the shower, use a plastic, non-slip stool.  Install grab bars by the toilet and in the tub and shower. Do not use towel bars as grab bars. What can I do in the   bedroom?  Make sure that you have a light by your bed that is easy to reach.  Do not use any sheets or blankets that are too big for your bed. They should not hang down onto the floor.  Have a firm chair that has side arms. You can use this for support while you get dressed. What can I do in the kitchen?  Clean up any spills right away.  If you need to reach something above you, use a strong step stool that has a grab bar.  Keep electrical cords out of the way.  Do not use floor polish or wax that makes floors slippery. If you must use wax, use non-skid floor wax. What can I do with my stairs?  Do not leave any items on the stairs.  Make sure that you have a light switch at the top of the stairs and the bottom of the stairs. If you do not have them, ask someone to add them for you.  Make sure that there are handrails on both sides of the stairs, and use them. Fix handrails that are broken or loose. Make sure that handrails are as long as the stairways.  Install non-slip stair treads on all stairs in your home.  Avoid having throw rugs at the top or bottom of the stairs. If you do have throw rugs, attach them to the floor with carpet tape.  Choose a carpet that does not hide the edge of the steps on the stairway.  Check any carpeting to make sure that it is firmly attached to the stairs. Fix any carpet that is loose or worn. What can I do on the outside of my home?  Use bright outdoor lighting.  Regularly fix the edges of walkways and driveways and fix any cracks.  Remove anything that might make you trip as you walk through a door, such as a raised step or threshold.  Trim any bushes or trees on the path to your home.  Regularly check to see if handrails are loose or broken. Make sure that both sides  of any steps have handrails.  Install guardrails along the edges of any raised decks and porches.  Clear walking paths of anything that might make someone trip, such as tools or rocks.  Have any leaves, snow, or ice cleared regularly.  Use sand or salt on walking paths during winter.  Clean up any spills in your garage right away. This includes grease or oil spills. What other actions can I take?  Wear shoes that: ? Have a low heel. Do not wear high heels. ? Have rubber bottoms. ? Are comfortable and fit you well. ? Are closed at the toe. Do not wear open-toe sandals.  Use tools that help you move around (mobility aids) if they are needed. These include: ? Canes. ? Walkers. ? Scooters. ? Crutches.  Review your medicines with your doctor. Some medicines can make you feel dizzy. This can increase your chance of falling. Ask your doctor what other things you can do to help prevent falls. Where to find more information  Centers for Disease Control and Prevention, [employer]: https://cdc.gov  National Institute on Aging: https://go4life.nia.nih.gov Contact a doctor if:  You are afraid of falling at home.  You feel weak, drowsy, or dizzy at home.  You fall at home. Summary  There are many simple things that you can do to make your home safe and to help prevent falls.  Ways to make your home   safe include removing tripping hazards and installing grab bars in the bathroom.  Ask for help when making these changes in your home. This information is not intended to replace advice given to you by your health care provider. Make sure you discuss any questions you have with your health care provider. Document Released: 10/31/2008 Document Revised: 08/19/2016 Document Reviewed: 08/19/2016 Elsevier Interactive Patient Education  2019 [employer] Maintenance, Female Adopting a healthy lifestyle and getting preventive care can go a long way to promote health and wellness. Talk  with your health care provider about what schedule of regular examinations is right for you. This is a good chance for you to check in with your provider about disease prevention and staying healthy. In between checkups, there are plenty of things you can do on your own. Experts have done a lot of research about which lifestyle changes and preventive measures are most likely to keep you healthy. Ask your health care provider for more information. Weight and diet Eat a healthy diet  Be sure to include plenty of vegetables, fruits, low-fat dairy products, and lean protein.  Do not eat a lot of foods high in solid fats, added sugars, or salt.  Get regular exercise. This is one of the most important things you can do for your health. ? Most adults should exercise for at least 150 minutes each week. The exercise should increase your heart rate and make you sweat (moderate-intensity exercise). ? Most adults should also do strengthening exercises at least twice a week. This is in addition to the moderate-intensity exercise. Maintain a healthy weight  Body mass index (BMI) is a measurement that can be used to identify possible weight problems. It estimates body fat based on height and weight. Your health care provider can help determine your BMI and help you achieve or maintain a healthy weight.  For females 58 years of age and older: ? A BMI below 18.5 is considered underweight. ? A BMI of 18.5 to 24.9 is normal. ? A BMI of 25 to 29.9 is considered overweight. ? A BMI of 30 and above is considered obese. Watch levels of cholesterol and blood lipids  You should start having your blood tested for lipids and cholesterol at 81 years of age, then have this test every 5 years.  You may need to have your cholesterol levels checked more often if: ? Your lipid or cholesterol levels are high. ? You are older than 81 years of age. ? You are at high risk for heart disease. Cancer screening Lung  Cancer  Lung cancer screening is recommended for adults 51-34 years old who are at high risk for lung cancer because of a history of smoking.  A yearly low-dose CT scan of the lungs is recommended for people who: ? Currently smoke. ? Have quit within the past 15 years. ? Have at least a 30-pack-year history of smoking. A pack year is smoking an average of one pack of cigarettes a day for 1 year.  Yearly screening should continue until it has been 15 years since you quit.  Yearly screening should stop if you develop a health problem that would prevent you from having lung cancer treatment. Breast Cancer  Practice breast self-awareness. This means understanding how your breasts normally appear and feel.  It also means doing regular breast self-exams. Let your health care provider know about any changes, no matter how small.  If you are in your 20s or 30s, you should have  a clinical breast exam (CBE) by a health care provider every 1-3 years as part of a regular health exam.  If you are 69 or older, have a CBE every year. Also consider having a breast X-ray (mammogram) every year.  If you have a family history of breast cancer, talk to your health care provider about genetic screening.  If you are at high risk for breast cancer, talk to your health care provider about having an MRI and a mammogram every year.  Breast cancer gene (BRCA) assessment is recommended for women who have family members with BRCA-related cancers. BRCA-related cancers include: ? Breast. ? Ovarian. ? Tubal. ? Peritoneal cancers.  Results of the assessment will determine the need for genetic counseling and BRCA1 and BRCA2 testing. Cervical Cancer Your health care provider may recommend that you be screened regularly for cancer of the pelvic organs (ovaries, uterus, and vagina). This screening involves a pelvic examination, including checking for microscopic changes to the surface of your cervix (Pap test). You may  be encouraged to have this screening done every 3 years, beginning at age 17.  For women ages 22-65, health care providers may recommend pelvic exams and Pap testing every 3 years, or they may recommend the Pap and pelvic exam, combined with testing for human papilloma virus (HPV), every 5 years. Some types of HPV increase your risk of cervical cancer. Testing for HPV may also be done on women of any age with unclear Pap test results.  Other health care providers may not recommend any screening for nonpregnant women who are considered low risk for pelvic cancer and who do not have symptoms. Ask your health care provider if a screening pelvic exam is right for you.  If you have had past treatment for cervical cancer or a condition that could lead to cancer, you need Pap tests and screening for cancer for at least 20 years after your treatment. If Pap tests have been discontinued, your risk factors (such as having a new sexual partner) need to be reassessed to determine if screening should resume. Some women have medical problems that increase the chance of getting cervical cancer. In these cases, your health care provider may recommend more frequent screening and Pap tests. Colorectal Cancer  This type of cancer can be detected and often prevented.  Routine colorectal cancer screening usually begins at 81 years of age and continues through 81 years of age.  Your health care provider may recommend screening at an earlier age if you have risk factors for colon cancer.  Your health care provider may also recommend using home test kits to check for hidden blood in the stool.  A small camera at the end of a tube can be used to examine your colon directly (sigmoidoscopy or colonoscopy). This is done to check for the earliest forms of colorectal cancer.  Routine screening usually begins at age 4.  Direct examination of the colon should be repeated every 5-10 years through 81 years of age. However, you  may need to be screened more often if early forms of precancerous polyps or small growths are found. Skin Cancer  Check your skin from head to toe regularly.  Tell your health care provider about any new moles or changes in moles, especially if there is a change in a mole's shape or color.  Also tell your health care provider if you have a mole that is larger than the size of a pencil eraser.  Always use sunscreen. Apply sunscreen  liberally and repeatedly throughout the day.  Protect yourself by wearing long sleeves, pants, a wide-brimmed hat, and sunglasses whenever you are outside. Heart disease, diabetes, and high blood pressure  High blood pressure causes heart disease and increases the risk of stroke. High blood pressure is more likely to develop in: ? People who have blood pressure in the high end of the normal range (130-139/85-89 mm Hg). ? People who are overweight or obese. ? People who are African American.  If you are 54-1 years of age, have your blood pressure checked every 3-5 years. If you are 53 years of age or older, have your blood pressure checked every year. You should have your blood pressure measured twice-once when you are at a hospital or clinic, and once when you are not at a hospital or clinic. Record the average of the two measurements. To check your blood pressure when you are not at a hospital or clinic, you can use: ? An automated blood pressure machine at a pharmacy. ? A home blood pressure monitor.  If you are between 35 years and 57 years old, ask your health care provider if you should take aspirin to prevent strokes.  Have regular diabetes screenings. This involves taking a blood sample to check your fasting blood sugar level. ? If you are at a normal weight and have a low risk for diabetes, have this test once every three years after 81 years of age. ? If you are overweight and have a high risk for diabetes, consider being tested at a younger age or more  often. Preventing infection Hepatitis B  If you have a higher risk for hepatitis B, you should be screened for this virus. You are considered at high risk for hepatitis B if: ? You were born in a country where hepatitis B is common. Ask your health care provider which countries are considered high risk. ? Your parents were born in a high-risk country, and you have not been immunized against hepatitis B (hepatitis B vaccine). ? You have HIV or AIDS. ? You use needles to inject street drugs. ? You live with someone who has hepatitis B. ? You have had sex with someone who has hepatitis B. ? You get hemodialysis treatment. ? You take certain medicines for conditions, including cancer, organ transplantation, and autoimmune conditions. Hepatitis C  Blood testing is recommended for: ? Everyone born from [birth year] through 1963. ? Anyone with known risk factors for hepatitis C. Sexually transmitted infections (STIs)  You should be screened for sexually transmitted infections (STIs) including gonorrhea and chlamydia if: ? You are sexually active and are younger than 81 years of age. ? You are older than 81 years of age and your health care provider tells you that you are at risk for this type of infection. ? Your sexual activity has changed since you were last screened and you are at an increased risk for chlamydia or gonorrhea. Ask your health care provider if you are at risk.  If you do not have HIV, but are at risk, it may be recommended that you take a prescription medicine daily to prevent HIV infection. This is called pre-exposure prophylaxis (PrEP). You are considered at risk if: ? You are sexually active and do not regularly use condoms or know the HIV status of your partner(s). ? You take drugs by injection. ? You are sexually active with a partner who has HIV. Talk with your health care provider about whether you are at high  risk of being infected with HIV. If you choose to begin PrEP, you  should first be tested for HIV. You should then be tested every 3 months for as long as you are taking PrEP. Pregnancy  If you are premenopausal and you may become pregnant, ask your health care provider about preconception counseling.  If you may become pregnant, take 400 to 800 micrograms (mcg) of folic acid every day.  If you want to prevent pregnancy, talk to your health care provider about birth control (contraception). Osteoporosis and menopause  Osteoporosis is a disease in which the bones lose minerals and strength with aging. This can result in serious bone fractures. Your risk for osteoporosis can be identified using a bone density scan.  If you are 78 years of age or older, or if you are at risk for osteoporosis and fractures, ask your health care provider if you should be screened.  Ask your health care provider whether you should take a calcium or vitamin D supplement to lower your risk for osteoporosis.  Menopause may have certain physical symptoms and risks.  Hormone replacement therapy may reduce some of these symptoms and risks. Talk to your health care provider about whether hormone replacement therapy is right for you. Follow these instructions at home:  Schedule regular health, dental, and eye exams.  Stay current with your immunizations.  Do not use any tobacco products including cigarettes, chewing tobacco, or electronic cigarettes.  If you are pregnant, do not drink alcohol.  If you are breastfeeding, limit how much and how often you drink alcohol.  Limit alcohol intake to no more than 1 drink per day for nonpregnant women. One drink equals 12 ounces of beer, 5 ounces of wine, or 1 ounces of hard liquor.  Do not use street drugs.  Do not share needles.  Ask your health care provider for help if you need support or information about quitting drugs.  Tell your health care provider if you often feel depressed.  Tell your health care provider if you have  ever been abused or do not feel safe at home. This information is not intended to replace advice given to you by your health care provider. Make sure you discuss any questions you have with your health care provider. Document Released: 07/20/2010 Document Revised: 06/12/2015 Document Reviewed: 10/08/2014 Elsevier Interactive Patient Education  2019 [employer].   Hearing Loss  Hearing loss is a partial or total loss of the ability to hear. This can be temporary or permanent, and it can happen in one or both ears. Hearing loss may be referred to as deafness. Medical care is necessary to treat hearing loss properly and to prevent the condition from getting worse. Your hearing may partially or completely come back, depending on what caused your hearing loss and how severe it is. In some cases, hearing loss is permanent. What are the causes? Common causes of hearing loss include:  Too much wax in the ear canal.  Infection of the ear canal or middle ear.  Fluid in the middle ear.  Injury to the ear or surrounding area.  An object stuck in the ear.  Prolonged exposure to loud sounds, such as music. Less common causes of hearing loss include:  Tumors in the ear.  Viral or bacterial infections, such as meningitis.  A hole in the eardrum (perforated eardrum).  Problems with the hearing nerve that sends signals between the brain and the ear.  Certain medicines. What are the signs or  symptoms? Symptoms of this condition may include:  Difficulty telling the difference between sounds.  Difficulty following a conversation when there is background noise.  Lack of response to sounds in your environment. This may be most noticeable when you do not respond to startling sounds.  Needing to turn up the volume on the television, radio, etc.  Ringing in the ears.  Dizziness.  Pain in the ears. How is this diagnosed? This condition is diagnosed based on a physical exam and a hearing  test (audiometry). The audiometry test will be performed by a hearing specialist (audiologist). You may also be referred to an ear, nose, and throat (ENT) specialist (otolaryngologist). How is this treated? Treatment for recent onset of hearing loss may include:  Ear wax removal.  Being prescribed medicines to prevent infection (antibiotics).  Being prescribed medicines to reduce inflammation (corticosteroids). Follow these instructions at home:  If you were prescribed an antibiotic medicine, take it as told by your health care provider. Do not stop taking the antibiotic even if you start to feel better.  Take over-the-counter and prescription medicines only as told by your health care provider.  Avoid loud noises.  Return to your normal activities as told by your health care provider. Ask your health care provider what activities are safe for you.  Keep all follow-up visits as told by your health care provider. This is important. Contact a health care provider if:  You feel dizzy.  You develop new symptoms.  You vomit or feel nauseous.  You have a fever. Get help right away if:  You develop sudden changes in your vision.  You have severe ear pain.  You have new or increased weakness.  You have a severe headache. This information is not intended to replace advice given to you by your health care provider. Make sure you discuss any questions you have with your health care provider. Document Released: 01/04/2005 Document Revised: 06/12/2015 Document Reviewed: 05/22/2014 Elsevier Interactive Patient Education  2019 [employer]  

## 2018-03-21 ENCOUNTER — Telehealth: Payer: Self-pay | Admitting: Cardiology

## 2018-03-21 DIAGNOSIS — I350 Nonrheumatic aortic (valve) stenosis: Secondary | ICD-10-CM

## 2018-03-21 NOTE — Telephone Encounter (Signed)
New message  Patient requesting order for echo

## 2018-03-21 NOTE — Telephone Encounter (Signed)
Spoke with pt, she reports dyspnea when walking uphill or stairs and this has not changed. She reports occ faint feelings that go away with rest. Discussed with dr [name], echo and follow up appt scheduled.

## 2018-03-24 ENCOUNTER — Ambulatory Visit (HOSPITAL_COMMUNITY): Payer: Medicare Other | Attending: Internal Medicine

## 2018-03-24 ENCOUNTER — Ambulatory Visit (INDEPENDENT_AMBULATORY_CARE_PROVIDER_SITE_OTHER): Payer: Medicare Other | Admitting: Cardiology

## 2018-03-24 ENCOUNTER — Encounter: Payer: Self-pay | Admitting: Cardiology

## 2018-03-24 VITALS — BP 126/80 | HR 72 | Ht 62.0 in | Wt 247.0 lb

## 2018-03-24 DIAGNOSIS — I350 Nonrheumatic aortic (valve) stenosis: Secondary | ICD-10-CM | POA: Diagnosis not present

## 2018-03-24 DIAGNOSIS — I10 Essential (primary) hypertension: Secondary | ICD-10-CM | POA: Diagnosis not present

## 2018-03-24 DIAGNOSIS — E7800 Pure hypercholesterolemia, unspecified: Secondary | ICD-10-CM | POA: Diagnosis not present

## 2018-03-24 DIAGNOSIS — I2510 Atherosclerotic heart disease of native coronary artery without angina pectoris: Secondary | ICD-10-CM

## 2018-03-24 LAB — ECHOCARDIOGRAM COMPLETE
Height: 62 in
Weight: 3952 oz

## 2018-03-24 NOTE — Progress Notes (Signed)
HPI: FU aortic stenosis. Placed on beta blocker previously secondary to PVCs. Cardiac catheterization September 2015 showed a 20% left main and no other obstructive disease. Ejection fraction 60-65%. Mild to moderate aortic stenosis.Nuclear study 11/18 showed EF 62, probable shifting breast attenuation. CTA 11/18 showed no pulmonary embolus.Echo 10/19 showed normal LV function; grade 2 DD; moderate AS (mean gradient 32 mmHg), mild MR, mild LAE. Since last seen,she has worsening dyspnea on exertion.  Note she does have chronic dyspnea on exertion but she feels as though it has changed in the past several months.  She notes mild bilateral lower extremity edema.  She denies chest pain or syncope.  Current Outpatient Medications  Medication Sig Dispense Refill  . aspirin 81 MG tablet Take 81 mg by mouth daily.    . cholecalciferol (VITAMIN D) 1000 UNITS tablet Take 1,000 Units by mouth daily.      . hydrochlorothiazide (MICROZIDE) 12.5 MG capsule Take 1 capsule (12.5 mg total) by mouth daily. 90 capsule 3  . losartan (COZAAR) 25 MG tablet Take 2 tablets (50 mg total) by mouth daily. 180 tablet 3  . metoprolol succinate (TOPROL-XL) 50 MG 24 hr tablet TAKE 1 TABLET BY MOUTH  DAILY 90 tablet 1  . Misc Natural Products (TART CHERRY ADVANCED PO) Take by mouth daily.    . multivitamin (THERAGRAN) per tablet Take 1 tablet by mouth daily.     . Omega-3 1000 MG CAPS Take 1 capsule by mouth every morning.     Marland Kitchen omeprazole-sodium bicarbonate (ZEGERID) 40-1100 MG per capsule Take 1 capsule by mouth every other day. 1 capsule daily    . Respiratory Therapy Supplies (NEBULIZER) DEVI by Does not apply route.    . rosuvastatin (CRESTOR) 40 MG tablet Take 1 tablet (40 mg total) by mouth every other day. 90 tablet 1   No current facility-administered medications for this visit.      Past Medical History:  Diagnosis Date  . Aortic stenosis   . Aortic valve disorder 06/18/2008   Qualifier: Diagnosis of   By: [name], MD, Kandyce Rud   . CAD (coronary artery disease) 03/19/2014  . Chest pain 06/17/2011  . Colon polyp   . Coronary artery disease    aortic stenosis/mild per ECHO  . Dyspnea   . Essential hypertension 07/21/2006   Qualifier: Diagnosis of  By: Hulan Saas, CMA (AAMA), Quita Skye   . FATIGUE 12/31/2009   Qualifier: Diagnosis of  By: Regis Bill MD, [name]   . Generalized osteoarthrosis, involving multiple sites   . GERD (gastroesophageal reflux disease)    dilitation  . HEART MURMUR, SYSTOLIC 1/61/0960   Qualifier: Diagnosis of  By: Regis Bill MD, [name]   . Hx of colonoscopy with polypectomy 07/14/2012   medoff   . Hyperlipidemia   . HYPERLIPIDEMIA 07/21/2006   Qualifier: Diagnosis of  By: Hulan Saas, CMA (AAMA), Quita Skye   . Hypertension   . Impaired fasting glucose   . Obesity   . Personal history of other diseases of digestive system   . PONV (postoperative nausea and vomiting)   . SOB 07/21/2006   PFT's 2005   C/w classic obesity effects with disporportionate ERV reduction      Past Surgical History:  Procedure Laterality Date  . APPENDECTOMY    . CHOLECYSTECTOMY    . FOOT SURGERY     rt  . KNEE SURGERY     rt  . LEFT AND RIGHT HEART CATHETERIZATION WITH CORONARY ANGIOGRAM  N/A 10/10/2013   Procedure: LEFT AND RIGHT HEART CATHETERIZATION WITH CORONARY ANGIOGRAM;  Surgeon: Burnell Blanks, MD;  Location: [facility] CATH LAB;  Service: Cardiovascular;  Laterality: N/A;  . TONSILLECTOMY    . TOTAL KNEE ARTHROPLASTY  02/01/2011   Procedure: TOTAL KNEE ARTHROPLASTY;  Surgeon: Kerin Salen;  Location: [place];  Service: Orthopedics;  Laterality: Right;  DEPUY/SIGMA    Social History   Socioeconomic History  . Marital status: Married    Spouse name: Not on file  . Number of children: 4  . Years of education: Not on file  . Highest education level: Not on file  Occupational History  . Occupation: Retired    Comment: Takes care of [employer]  . Occupation: Therapist, sports,  peds, cardiology    Comment: retired  Scientific laboratory technician  . Financial resource strain: Not hard at all  . Food insecurity:    Worry: Never true    Inability: Never true  . Transportation needs:    Medical: No    Non-medical: No  Tobacco Use  . Smoking status: Never Smoker  . Smokeless tobacco: Never Used  Substance and Sexual Activity  . Alcohol use: No  . Drug use: No  . Sexual activity: Not Currently    Comment: quit 40 yrs ago  Lifestyle  . Physical activity:    Days per week: 0 days    Minutes per session: 0 min  . Stress: Only a little  Relationships  . Social connections:    Talks on phone: More than three times a week    Gets together: Once a week    Attends religious service: More than 4 times per year    Active member of club or organization: Yes    Attends meetings of clubs or organizations: More than 4 times per year    Relationship status: Married  . Intimate partner violence:    Fear of current or ex partner: No    Emotionally abused: No    Physically abused: No    Forced sexual activity: No  Other Topics Concern  . Not on file  Social History Narrative   hh of 3 -4  Married   [place] son   At home    Invalid son paraplegia and husband    And grandson recently     No pets   Fluctuating hh membors she cooks and prepares for them           Family History  Problem Relation Age of Onset  . Coronary artery disease Other        female- 1st degree relative  . Coronary artery disease Other        female- 1st degree relative  . Hypertension Other   . Hyperlipidemia Other   . Heart attack Mother        age 66's  . Heart attack Father        age 38's  . Stroke Brother        died   . Hearing loss Son   . Sleep apnea Son   . Aortic stenosis Sister   . Stroke Brother   . Heart attack Brother   . Rheum arthritis Sister   . Osteoarthritis Sister   . Arthritis Sister   . Heart Problems Brother     ROS: no fevers or chills, productive cough, hemoptysis,  dysphasia, odynophagia, melena, hematochezia, dysuria, hematuria, rash, seizure activity, orthopnea, PND, claudication. Remaining systems are negative.  Physical Exam: Well-developed  obese in no acute distress.  Skin is warm and dry.  HEENT is normal.  Neck is supple.  Chest is clear to auscultation with normal expansion.  Cardiovascular exam is regular rate and rhythm.  3/6 systolic murmur left sternal border.  S2 is diminished. Abdominal exam nontender or distended. No masses palpated. Extremities show trace edema. neuro grossly intact  ECG-sinus rhythm at a rate of 72, no ST changes.  Personally reviewed  A/P  1 aortic stenosis-patient describes worsening dyspnea on exertion.  She has had chronic dyspnea but symptoms have progressed.  Significant aortic stenosis on examination.  We will repeat echocardiogram.  She will likely require right and left cardiac catheterization pending those results and then consideration of TAVR.  We will contact her once we have the results of her echocardiogram.  I did discuss the risks and benefits of cardiac catheterization including myocardial infarction, CVA and death and she agrees to proceed.  2 hypertension-patient's blood pressure is controlled.  Continue present medications and follow.  3 hyperlipidemia-continue statin.  4 coronary artery disease-patient denies chest pain.  Plan to continue medical therapy with aspirin and statin.  5 morbid obesity-we discussed importance of continued efforts at weight loss.  Kirk Ruths, MD

## 2018-03-24 NOTE — Patient Instructions (Signed)
Medication Instructions:  NO CHANGE If you need a refill on your cardiac medications before your next appointment, please call your pharmacy.   Lab work: If you have labs (blood work) drawn today and your tests are completely normal, you will receive your results only by: . MyChart Message (if you have MyChart) OR . A paper copy in the mail If you have any lab test that is abnormal or we need to change your treatment, we will call you to review the results.  Follow-Up: At [facility], you and your health needs are our priority.  As part of our continuing mission to provide you with exceptional heart care, we have created designated Provider Care Teams.  These Care Teams include your primary Cardiologist (physician) and Advanced Practice Providers (APPs -  Physician Assistants and Nurse Practitioners) who all work together to provide you with the care you need, when you need it. Your physician recommends that you schedule a follow-up appointment in: 3 MONTHS WITH DR CRENSHAW      

## 2018-03-24 NOTE — H&P (View-Only) (Signed)
HPI: FU aortic stenosis. Placed on beta blocker previously secondary to PVCs. Cardiac catheterization September 2015 showed a 20% left main and no other obstructive disease. Ejection fraction 60-65%. Mild to moderate aortic stenosis.Nuclear study 11/18 showed EF 62, probable shifting breast attenuation. CTA 11/18 showed no pulmonary embolus.Echo 10/19 showed normal LV function; grade 2 DD; moderate AS (mean gradient 32 mmHg), mild MR, mild LAE. Since last seen,she has worsening dyspnea on exertion.  Note she does have chronic dyspnea on exertion but she feels as though it has changed in the past several months.  She notes mild bilateral lower extremity edema.  She denies chest pain or syncope.  Current Outpatient Medications  Medication Sig Dispense Refill  . aspirin 81 MG tablet Take 81 mg by mouth daily.    . cholecalciferol (VITAMIN D) 1000 UNITS tablet Take 1,000 Units by mouth daily.      . hydrochlorothiazide (MICROZIDE) 12.5 MG capsule Take 1 capsule (12.5 mg total) by mouth daily. 90 capsule 3  . losartan (COZAAR) 25 MG tablet Take 2 tablets (50 mg total) by mouth daily. 180 tablet 3  . metoprolol succinate (TOPROL-XL) 50 MG 24 hr tablet TAKE 1 TABLET BY MOUTH  DAILY 90 tablet 1  . Misc Natural Products (TART CHERRY ADVANCED PO) Take by mouth daily.    . multivitamin (THERAGRAN) per tablet Take 1 tablet by mouth daily.     . Omega-3 1000 MG CAPS Take 1 capsule by mouth every morning.     Marland Kitchen omeprazole-sodium bicarbonate (ZEGERID) 40-1100 MG per capsule Take 1 capsule by mouth every other day. 1 capsule daily    . Respiratory Therapy Supplies (NEBULIZER) DEVI by Does not apply route.    . rosuvastatin (CRESTOR) 40 MG tablet Take 1 tablet (40 mg total) by mouth every other day. 90 tablet 1   No current facility-administered medications for this visit.      Past Medical History:  Diagnosis Date  . Aortic stenosis   . Aortic valve disorder 06/18/2008   Qualifier: Diagnosis of   By: [name], MD, Kandyce Rud   . CAD (coronary artery disease) 03/19/2014  . Chest pain 06/17/2011  . Colon polyp   . Coronary artery disease    aortic stenosis/mild per ECHO  . Dyspnea   . Essential hypertension 07/21/2006   Qualifier: Diagnosis of  By: Hulan Saas, CMA (AAMA), Quita Skye   . FATIGUE 12/31/2009   Qualifier: Diagnosis of  By: Regis Bill MD, [name]   . Generalized osteoarthrosis, involving multiple sites   . GERD (gastroesophageal reflux disease)    dilitation  . HEART MURMUR, SYSTOLIC 3/53/6144   Qualifier: Diagnosis of  By: Regis Bill MD, [name]   . Hx of colonoscopy with polypectomy 07/14/2012   medoff   . Hyperlipidemia   . HYPERLIPIDEMIA 07/21/2006   Qualifier: Diagnosis of  By: Hulan Saas, CMA (AAMA), Quita Skye   . Hypertension   . Impaired fasting glucose   . Obesity   . Personal history of other diseases of digestive system   . PONV (postoperative nausea and vomiting)   . SOB 07/21/2006   PFT's 2005   C/w classic obesity effects with disporportionate ERV reduction      Past Surgical History:  Procedure Laterality Date  . APPENDECTOMY    . CHOLECYSTECTOMY    . FOOT SURGERY     rt  . KNEE SURGERY     rt  . LEFT AND RIGHT HEART CATHETERIZATION WITH CORONARY ANGIOGRAM  N/A 10/10/2013   Procedure: LEFT AND RIGHT HEART CATHETERIZATION WITH CORONARY ANGIOGRAM;  Surgeon: Burnell Blanks, MD;  Location: [facility] CATH LAB;  Service: Cardiovascular;  Laterality: N/A;  . TONSILLECTOMY    . TOTAL KNEE ARTHROPLASTY  02/01/2011   Procedure: TOTAL KNEE ARTHROPLASTY;  Surgeon: Kerin Salen;  Location: [place];  Service: Orthopedics;  Laterality: Right;  DEPUY/SIGMA    Social History   Socioeconomic History  . Marital status: Married    Spouse name: Not on file  . Number of children: 4  . Years of education: Not on file  . Highest education level: Not on file  Occupational History  . Occupation: Retired    Comment: Takes care of [employer]  . Occupation: Therapist, sports,  peds, cardiology    Comment: retired  Scientific laboratory technician  . Financial resource strain: Not hard at all  . Food insecurity:    Worry: Never true    Inability: Never true  . Transportation needs:    Medical: No    Non-medical: No  Tobacco Use  . Smoking status: Never Smoker  . Smokeless tobacco: Never Used  Substance and Sexual Activity  . Alcohol use: No  . Drug use: No  . Sexual activity: Not Currently    Comment: quit 40 yrs ago  Lifestyle  . Physical activity:    Days per week: 0 days    Minutes per session: 0 min  . Stress: Only a little  Relationships  . Social connections:    Talks on phone: More than three times a week    Gets together: Once a week    Attends religious service: More than 4 times per year    Active member of club or organization: Yes    Attends meetings of clubs or organizations: More than 4 times per year    Relationship status: Married  . Intimate partner violence:    Fear of current or ex partner: No    Emotionally abused: No    Physically abused: No    Forced sexual activity: No  Other Topics Concern  . Not on file  Social History Narrative   hh of 3 -4  Married   [place] son   At home    Invalid son paraplegia and husband    And grandson recently     No pets   Fluctuating hh membors she cooks and prepares for them           Family History  Problem Relation Age of Onset  . Coronary artery disease Other        female- 1st degree relative  . Coronary artery disease Other        female- 1st degree relative  . Hypertension Other   . Hyperlipidemia Other   . Heart attack Mother        age 49's  . Heart attack Father        age 21's  . Stroke Brother        died   . Hearing loss Son   . Sleep apnea Son   . Aortic stenosis Sister   . Stroke Brother   . Heart attack Brother   . Rheum arthritis Sister   . Osteoarthritis Sister   . Arthritis Sister   . Heart Problems Brother     ROS: no fevers or chills, productive cough, hemoptysis,  dysphasia, odynophagia, melena, hematochezia, dysuria, hematuria, rash, seizure activity, orthopnea, PND, claudication. Remaining systems are negative.  Physical Exam: Well-developed  obese in no acute distress.  Skin is warm and dry.  HEENT is normal.  Neck is supple.  Chest is clear to auscultation with normal expansion.  Cardiovascular exam is regular rate and rhythm.  3/6 systolic murmur left sternal border.  S2 is diminished. Abdominal exam nontender or distended. No masses palpated. Extremities show trace edema. neuro grossly intact  ECG-sinus rhythm at a rate of 72, no ST changes.  Personally reviewed  A/P  1 aortic stenosis-patient describes worsening dyspnea on exertion.  She has had chronic dyspnea but symptoms have progressed.  Significant aortic stenosis on examination.  We will repeat echocardiogram.  She will likely require right and left cardiac catheterization pending those results and then consideration of TAVR.  We will contact her once we have the results of her echocardiogram.  I did discuss the risks and benefits of cardiac catheterization including myocardial infarction, CVA and death and she agrees to proceed.  2 hypertension-patient's blood pressure is controlled.  Continue present medications and follow.  3 hyperlipidemia-continue statin.  4 coronary artery disease-patient denies chest pain.  Plan to continue medical therapy with aspirin and statin.  5 morbid obesity-we discussed importance of continued efforts at weight loss.  Kirk Ruths, MD

## 2018-03-27 ENCOUNTER — Encounter: Payer: Self-pay | Admitting: *Deleted

## 2018-03-27 ENCOUNTER — Telehealth: Payer: Self-pay | Admitting: *Deleted

## 2018-03-27 ENCOUNTER — Other Ambulatory Visit: Payer: Self-pay | Admitting: *Deleted

## 2018-03-27 DIAGNOSIS — I350 Nonrheumatic aortic (valve) stenosis: Secondary | ICD-10-CM

## 2018-03-27 NOTE — Telephone Encounter (Signed)
-----   Message from Lelon Perla, MD sent at 03/27/2018  7:27 AM EDT ----- Arrange right and left cardiac catheterization for aortic stenosis.  This should be with either Dr. Burt Knack or Dr. Angelena Form.  Needs TAVR evaluation. Kirk Ruths, MD

## 2018-03-27 NOTE — Telephone Encounter (Signed)
Spoke with pt, aware cath will be 04-06-2018 with dr Angelena Form. Letter of directions and Lab orders mailed to the pt

## 2018-03-31 ENCOUNTER — Other Ambulatory Visit: Payer: Self-pay | Admitting: Internal Medicine

## 2018-03-31 DIAGNOSIS — E2839 Other primary ovarian failure: Secondary | ICD-10-CM

## 2018-04-03 DIAGNOSIS — I350 Nonrheumatic aortic (valve) stenosis: Secondary | ICD-10-CM | POA: Diagnosis not present

## 2018-04-04 ENCOUNTER — Telehealth: Payer: Self-pay | Admitting: *Deleted

## 2018-04-04 LAB — CBC
HEMATOCRIT: 38.6 % (ref 34.0–46.6)
HEMOGLOBIN: 13.2 g/dL (ref 11.1–15.9)
MCH: 32.7 pg (ref 26.6–33.0)
MCHC: 34.2 g/dL (ref 31.5–35.7)
MCV: 96 fL (ref 79–97)
Platelets: 273 10*3/uL (ref 150–450)
RBC: 4.04 x10E6/uL (ref 3.77–5.28)
RDW: 12.3 % (ref 11.7–15.4)
WBC: 6.2 10*3/uL (ref 3.4–10.8)

## 2018-04-04 LAB — BASIC METABOLIC PANEL
BUN/Creatinine Ratio: 14 (ref 12–28)
BUN: 13 mg/dL (ref 8–27)
CALCIUM: 9.4 mg/dL (ref 8.7–10.3)
CO2: 27 mmol/L (ref 20–29)
Chloride: 99 mmol/L (ref 96–106)
Creatinine, Ser: 0.91 mg/dL (ref 0.57–1.00)
GFR calc Af Amer: 69 mL/min/{1.73_m2} (ref >59)
GFR, EST NON AFRICAN AMERICAN: 60 mL/min/{1.73_m2} (ref >59)
Glucose: 189 mg/dL — ABNORMAL HIGH (ref 65–99)
Potassium: 4.4 mmol/L (ref 3.5–5.2)
Sodium: 140 mmol/L (ref 134–144)

## 2018-04-04 NOTE — Telephone Encounter (Signed)
Pt contacted pre-catheterization scheduled at [facility] for: Thursday April 06, 2018 9 AM Verified arrival time and place: [place] Entrance A at: 7 AM  No solid food after midnight prior to cath, clear liquids until 5 AM day of procedure. Contrast allergy: no  Hold: HCTZ-AM of procedure.  Except hold medications AM meds can be  taken pre-cath with sip of water including: ASA 81 mg  Confirmed patient has responsible person to drive home post procedure and observe 24 hours after arriving home.  Attempted to contact patient to review instructions, no answer,no voicemail.

## 2018-04-05 ENCOUNTER — Telehealth: Payer: Self-pay

## 2018-04-05 NOTE — Telephone Encounter (Signed)
  [facility]   MULTIDISCIPLINARY HEART VALVE TEAM  Pt scheduled for Cardiac Catheterization on 04/06/2018 with Dr Angelena Form as part of pre TAVR evaluation. Pt has severe symptomatic aortic stenosis and the team is okay with proceeding as scheduled as long as the pt is aware of potential risk of exposure to Covid-19. Pt aware and would like to proceed so that additional data can be obtained and the pt can be triaged in regards to how we proceed with additional TAVR evaluation

## 2018-04-06 ENCOUNTER — Other Ambulatory Visit: Payer: Self-pay

## 2018-04-06 ENCOUNTER — Encounter (HOSPITAL_COMMUNITY): Payer: Self-pay | Admitting: Cardiovascular Disease

## 2018-04-06 ENCOUNTER — Ambulatory Visit (HOSPITAL_COMMUNITY)
Admission: RE | Admit: 2018-04-06 | Discharge: 2018-04-06 | Disposition: A | Discharge status: Home / Self Care | Payer: Medicare Other | Attending: Cardiovascular Disease | Admitting: Cardiovascular Disease

## 2018-04-06 ENCOUNTER — Encounter (HOSPITAL_COMMUNITY)
Admission: RE | Disposition: A | Discharge status: Home / Self Care | Payer: Self-pay | Source: Home / Self Care | Attending: Cardiovascular Disease

## 2018-04-06 DIAGNOSIS — E785 Hyperlipidemia, unspecified: Secondary | ICD-10-CM | POA: Insufficient documentation

## 2018-04-06 DIAGNOSIS — I2510 Atherosclerotic heart disease of native coronary artery without angina pectoris: Secondary | ICD-10-CM

## 2018-04-06 DIAGNOSIS — I10 Essential (primary) hypertension: Secondary | ICD-10-CM | POA: Diagnosis not present

## 2018-04-06 DIAGNOSIS — Z7982 Long term (current) use of aspirin: Secondary | ICD-10-CM | POA: Insufficient documentation

## 2018-04-06 DIAGNOSIS — R0609 Other forms of dyspnea: Secondary | ICD-10-CM | POA: Diagnosis not present

## 2018-04-06 DIAGNOSIS — I350 Nonrheumatic aortic (valve) stenosis: Secondary | ICD-10-CM

## 2018-04-06 DIAGNOSIS — Z6841 Body Mass Index (BMI) 40.0 and over, adult: Secondary | ICD-10-CM | POA: Insufficient documentation

## 2018-04-06 DIAGNOSIS — Z79899 Other long term (current) drug therapy: Secondary | ICD-10-CM | POA: Insufficient documentation

## 2018-04-06 DIAGNOSIS — K219 Gastro-esophageal reflux disease without esophagitis: Secondary | ICD-10-CM | POA: Diagnosis not present

## 2018-04-06 HISTORY — PX: RIGHT/LEFT HEART CATH AND CORONARY ANGIOGRAPHY: CATH118266

## 2018-04-06 LAB — POCT I-STAT 7, (LYTES, BLD GAS, ICA,H+H)
Acid-Base Excess: 1 mmol/L (ref 0.0–2.0)
BICARBONATE: 26.5 mmol/L (ref 20.0–28.0)
Calcium, Ion: 1.13 mmol/L — ABNORMAL LOW (ref 1.15–1.40)
HCT: 33 % — ABNORMAL LOW (ref 36.0–46.0)
Hemoglobin: 11.2 g/dL — ABNORMAL LOW (ref 12.0–15.0)
O2 Saturation: 99 %
Potassium: 4 mmol/L (ref 3.5–5.1)
Sodium: 141 mmol/L (ref 135–145)
TCO2: 28 mmol/L (ref 22–32)
pCO2 arterial: 44.6 mmHg (ref 32.0–48.0)
pH, Arterial: 7.383 (ref 7.350–7.450)
pO2, Arterial: 140 mmHg — ABNORMAL HIGH (ref 83.0–108.0)

## 2018-04-06 LAB — POCT I-STAT EG7
Bicarbonate: 26.2 mmol/L (ref 20.0–28.0)
Calcium, Ion: 1.07 mmol/L — ABNORMAL LOW (ref 1.15–1.40)
HCT: 32 % — ABNORMAL LOW (ref 36.0–46.0)
Hemoglobin: 10.9 g/dL — ABNORMAL LOW (ref 12.0–15.0)
O2 Saturation: 79 %
Potassium: 3.8 mmol/L (ref 3.5–5.1)
Sodium: 142 mmol/L (ref 135–145)
TCO2: 28 mmol/L (ref 22–32)
pCO2, Ven: 47.9 mmHg (ref 44.0–60.0)
pH, Ven: 7.345 (ref 7.250–7.430)
pO2, Ven: 46 mmHg — ABNORMAL HIGH (ref 32.0–45.0)

## 2018-04-06 SURGERY — RIGHT/LEFT HEART CATH AND CORONARY ANGIOGRAPHY
Anesthesia: LOCAL

## 2018-04-06 MED ORDER — SODIUM CHLORIDE 0.9 % IV SOLN: INTRAVENOUS | Status: AC

## 2018-04-06 MED ORDER — VERAPAMIL HCL 2.5 MG/ML IV SOLN
INTRAVENOUS | Status: AC
  Filled 2018-04-06: qty 2

## 2018-04-06 MED ORDER — SODIUM CHLORIDE 0.9 % IV SOLN: 250.0000 mL | INTRAVENOUS | Status: DC | PRN

## 2018-04-06 MED ORDER — HEPARIN SODIUM (PORCINE) 1000 UNIT/ML IJ SOLN
INTRAMUSCULAR | Status: DC | PRN
  Administered 2018-04-06: 5000 [IU] via INTRAVENOUS

## 2018-04-06 MED ORDER — VERAPAMIL HCL 2.5 MG/ML IV SOLN
INTRAVENOUS | Status: DC | PRN
  Administered 2018-04-06: 10 mL via INTRA_ARTERIAL

## 2018-04-06 MED ORDER — SODIUM CHLORIDE 0.9 % WEIGHT BASED INFUSION
3.0000 mL/kg/h | INTRAVENOUS | Status: AC
  Administered 2018-04-06: 3 mL/kg/h via INTRAVENOUS

## 2018-04-06 MED ORDER — FENTANYL CITRATE (PF) 100 MCG/2ML IJ SOLN
INTRAMUSCULAR | Status: DC | PRN
  Administered 2018-04-06: 25 ug via INTRAVENOUS

## 2018-04-06 MED ORDER — ASPIRIN 81 MG PO CHEW: 81.0000 mg | CHEWABLE_TABLET | ORAL | Status: DC

## 2018-04-06 MED ORDER — IOHEXOL 350 MG/ML SOLN
INTRAVENOUS | Status: DC | PRN
  Administered 2018-04-06: 60 mL via INTRA_ARTERIAL

## 2018-04-06 MED ORDER — HEPARIN (PORCINE) IN NACL 1000-0.9 UT/500ML-% IV SOLN
INTRAVENOUS | Status: DC | PRN
  Administered 2018-04-06 (×2): 500 mL

## 2018-04-06 MED ORDER — SODIUM CHLORIDE 0.9 % WEIGHT BASED INFUSION: 1.0000 mL/kg/h | INTRAVENOUS | Status: DC

## 2018-04-06 MED ORDER — HEPARIN (PORCINE) IN NACL 1000-0.9 UT/500ML-% IV SOLN
INTRAVENOUS | Status: AC
  Filled 2018-04-06: qty 1000

## 2018-04-06 MED ORDER — SODIUM CHLORIDE 0.9% FLUSH: 3.0000 mL | Freq: Two times a day (BID) | INTRAVENOUS | Status: DC

## 2018-04-06 MED ORDER — LIDOCAINE HCL (PF) 1 % IJ SOLN
INTRAMUSCULAR | Status: DC | PRN
  Administered 2018-04-06 (×2): 2 mL

## 2018-04-06 MED ORDER — MIDAZOLAM HCL 2 MG/2ML IJ SOLN
INTRAMUSCULAR | Status: AC
  Filled 2018-04-06: qty 2

## 2018-04-06 MED ORDER — ONDANSETRON HCL 4 MG/2ML IJ SOLN: 4.0000 mg | Freq: Four times a day (QID) | INTRAMUSCULAR | Status: DC | PRN

## 2018-04-06 MED ORDER — ACETAMINOPHEN 325 MG PO TABS: 650.0000 mg | ORAL_TABLET | ORAL | Status: DC | PRN

## 2018-04-06 MED ORDER — SODIUM CHLORIDE 0.9% FLUSH: 3.0000 mL | INTRAVENOUS | Status: DC | PRN

## 2018-04-06 MED ORDER — LIDOCAINE HCL (PF) 1 % IJ SOLN
INTRAMUSCULAR | Status: AC
  Filled 2018-04-06: qty 30

## 2018-04-06 MED ORDER — MIDAZOLAM HCL 2 MG/2ML IJ SOLN
INTRAMUSCULAR | Status: DC | PRN
  Administered 2018-04-06: 1 mg via INTRAVENOUS

## 2018-04-06 MED ORDER — FENTANYL CITRATE (PF) 100 MCG/2ML IJ SOLN
INTRAMUSCULAR | Status: AC
  Filled 2018-04-06: qty 2

## 2018-04-06 SURGICAL SUPPLY — 15 items
CATH 5FR JL3.5 JR4 ANG PIG MP (CATHETERS) ×2 IMPLANT
CATH BALLN WEDGE 5F 110CM (CATHETERS) ×2 IMPLANT
CATH INFINITI 5FR AL1 (CATHETERS) ×2 IMPLANT
DEVICE RAD COMP TR BAND LRG (VASCULAR PRODUCTS) ×2 IMPLANT
GLIDESHEATH SLEND SS 6F .021 (SHEATH) ×2 IMPLANT
GUIDEWIRE .025 260CM (WIRE) ×2 IMPLANT
GUIDEWIRE INQWIRE 1.5J.035X260 (WIRE) ×1 IMPLANT
INQWIRE 1.5J .035X260CM (WIRE) ×2
KIT HEART LEFT (KITS) ×2 IMPLANT
PACK CARDIAC CATHETERIZATION (CUSTOM PROCEDURE TRAY) ×2 IMPLANT
SHEATH GLIDE SLENDER 4/5FR (SHEATH) ×2 IMPLANT
SHEATH PROBE COVER 6X72 (BAG) ×2 IMPLANT
TRANSDUCER W/STOPCOCK (MISCELLANEOUS) ×2 IMPLANT
TUBING CIL FLEX 10 FLL-RA (TUBING) ×2 IMPLANT
WIRE EMERALD ST .035X150CM (WIRE) ×2 IMPLANT

## 2018-04-06 NOTE — Interval H&P Note (Signed)
History and Physical Interval Note:  04/06/2018 8:59 AM  Anna Gamble  has presented today for cardiac cath with the diagnosis of severe aortic stenosis.  The various methods of treatment have been discussed with the patient and family. After consideration of risks, benefits and other options for treatment, the patient has consented to  Procedure(s): RIGHT/LEFT HEART CATH AND CORONARY ANGIOGRAPHY (N/A) as a surgical intervention.  The patient's history has been reviewed, patient examined, no change in status, stable for surgery.  I have reviewed the patient's chart and labs.  Questions were answered to the patient's satisfaction.    Cath Lab Visit (complete for each Cath Lab visit)  Clinical Evaluation Leading to the Procedure:   ACS: No.  Non-ACS:    Anginal Classification: CCS II  Anti-ischemic medical therapy: Minimal Therapy (1 class of medications)  Non-Invasive Test Results: No non-invasive testing performed  Prior CABG: No previous CABG         Anna Gamble

## 2018-04-06 NOTE — Discharge Instructions (Signed)
Radial Site Care ° °This sheet gives you information about how to care for yourself after your procedure. Your health care provider may also give you more specific instructions. If you have problems or questions, contact your health care provider. °What can I expect after the procedure? °After the procedure, it is common to have: °· Bruising and tenderness at the catheter insertion area. °Follow these instructions at home: °Medicines °· Take over-the-counter and prescription medicines only as told by your health care provider. °Insertion site care °· Follow instructions from your health care provider about how to take care of your insertion site. Make sure you: °? Wash your hands with soap and water before you change your bandage (dressing). If soap and water are not available, use hand sanitizer. °? Change your dressing as told by your health care provider. °? Leave stitches (sutures), skin glue, or adhesive strips in place. These skin closures may need to stay in place for 2 weeks or longer. If adhesive strip edges start to loosen and curl up, you may trim the loose edges. Do not remove adhesive strips completely unless your health care provider tells you to do that. °· Check your insertion site every day for signs of infection. Check for: °? Redness, swelling, or pain. °? Fluid or blood. °? Pus or a bad smell. °? Warmth. °· Do not take baths, swim, or use a hot tub until your health care provider approves. °· You may shower 24-48 hours after the procedure, or as directed by your health care provider. °? Remove the dressing and gently wash the site with plain soap and water. °? Pat the area dry with a clean towel. °? Do not rub the site. That could cause bleeding. °· Do not apply powder or lotion to the site. °Activity ° °· For 24 hours after the procedure, or as directed by your health care provider: °? Do not flex or bend the affected arm. °? Do not push or pull heavy objects with the affected arm. °? Do not  drive yourself home from the hospital or clinic. You may drive 24 hours after the procedure unless your health care provider tells you not to. °? Do not operate machinery or power tools. °· Do not lift anything that is heavier than 10 lb (4.5 kg), or the limit that you are told, until your health care provider says that it is safe. °· Ask your health care provider when it is okay to: °? Return to work or school. °? Resume usual physical activities or sports. °? Resume sexual activity. °General instructions °· If the catheter site starts to bleed, raise your arm and put firm pressure on the site. If the bleeding does not stop, get help right away. This is a medical emergency. °· If you went home on the same day as your procedure, a responsible adult should be with you for the first 24 hours after you arrive home. °· Keep all follow-up visits as told by your health care provider. This is important. °Contact a health care provider if: °· You have a fever. °· You have redness, swelling, or yellow drainage around your insertion site. °Get help right away if: °· You have unusual pain at the radial site. °· The catheter insertion area swells very fast. °· The insertion area is bleeding, and the bleeding does not stop when you hold steady pressure on the area. °· Your arm or hand becomes pale, cool, tingly, or numb. °These symptoms may represent a serious problem   that is an emergency. Do not wait to see if the symptoms will go away. Get medical help right away. Call your local emergency services (911 in the [place] Do not drive yourself to the hospital. °Summary °· After the procedure, it is common to have bruising and tenderness at the site. °· Follow instructions from your health care provider about how to take care of your radial site wound. Check the wound every day for signs of infection. °· Do not lift anything that is heavier than 10 lb (4.5 kg), or the limit that you are told, until your health care provider says  that it is safe. °This information is not intended to replace advice given to you by your health care provider. Make sure you discuss any questions you have with your health care provider. °Document Released: 02/06/2010 Document Revised: 02/09/2017 Document Reviewed: 02/09/2017 °Elsevier Interactive Patient Education © 2019 [employer] ° °

## 2018-04-06 NOTE — Telephone Encounter (Signed)
Pt had Cardiac Cath 319/20

## 2018-05-23 ENCOUNTER — Other Ambulatory Visit: Payer: Self-pay

## 2018-05-23 DIAGNOSIS — I350 Nonrheumatic aortic (valve) stenosis: Secondary | ICD-10-CM

## 2018-05-23 DIAGNOSIS — R0609 Other forms of dyspnea: Secondary | ICD-10-CM

## 2018-05-24 ENCOUNTER — Other Ambulatory Visit: Payer: Self-pay

## 2018-05-24 DIAGNOSIS — I350 Nonrheumatic aortic (valve) stenosis: Secondary | ICD-10-CM

## 2018-05-26 ENCOUNTER — Other Ambulatory Visit: Payer: Medicare Other

## 2018-05-26 ENCOUNTER — Other Ambulatory Visit: Payer: Self-pay

## 2018-05-26 DIAGNOSIS — I350 Nonrheumatic aortic (valve) stenosis: Secondary | ICD-10-CM | POA: Diagnosis not present

## 2018-05-26 LAB — BASIC METABOLIC PANEL
BUN/Creatinine Ratio: 13 (ref 12–28)
BUN: 13 mg/dL (ref 8–27)
CO2: 27 mmol/L (ref 20–29)
Calcium: 9.5 mg/dL (ref 8.7–10.3)
Chloride: 97 mmol/L (ref 96–106)
Creatinine, Ser: 1.04 mg/dL — ABNORMAL HIGH (ref 0.57–1.00)
GFR calc Af Amer: 59 mL/min/{1.73_m2} — ABNORMAL LOW (ref >59)
GFR calc non Af Amer: 51 mL/min/{1.73_m2} — ABNORMAL LOW (ref >59)
Glucose: 120 mg/dL — ABNORMAL HIGH (ref 65–99)
Potassium: 4.4 mmol/L (ref 3.5–5.2)
Sodium: 138 mmol/L (ref 134–144)

## 2018-05-30 ENCOUNTER — Ambulatory Visit (INDEPENDENT_AMBULATORY_CARE_PROVIDER_SITE_OTHER): Payer: Medicare Other | Admitting: Internal Medicine

## 2018-05-30 ENCOUNTER — Other Ambulatory Visit: Payer: Self-pay

## 2018-05-30 ENCOUNTER — Encounter: Payer: Self-pay | Admitting: Internal Medicine

## 2018-05-30 DIAGNOSIS — R7301 Impaired fasting glucose: Secondary | ICD-10-CM

## 2018-05-30 DIAGNOSIS — I350 Nonrheumatic aortic (valve) stenosis: Secondary | ICD-10-CM

## 2018-05-30 DIAGNOSIS — I2510 Atherosclerotic heart disease of native coronary artery without angina pectoris: Secondary | ICD-10-CM | POA: Diagnosis not present

## 2018-05-30 NOTE — Progress Notes (Signed)
Virtual Visit via Video Note  I connected with@ on 05/30/18 at  1:00 PM EDT by a video enabled telemedicine application and verified that I am speaking with the correct person using two identifiers. Location patient: home Location provider:work office Persons participating in the virtual visit: patient, provider  WIth national recommendations  regarding COVID 19 pandemic   video visit is advised over in office visit for this patient.  Patient aware  of the limitations of evaluation and management by telemedicine and  availability of in person appointments. and agreed to proceed.   HPI: Anna Gamble presents for video visit  Fu  Sugar and  Weight.    since last visit she has been scheduled for  AV surgery min invasive on May 19th  . She had lost some weight but prob back on since  Isolation at home  .    She  Had lab done NF   Sugar not too bad?  bp has been stable but ongoing dypnea fel from  Her  AS .   ROS: See pertinent positives and negatives per HPI. No cough  No syncope neuro sx   Past Medical History:  Diagnosis Date  . Aortic stenosis   . Aortic valve disorder 06/18/2008   Qualifier: Diagnosis of  By: [name], MD, Kandyce Rud   . CAD (coronary artery disease) 03/19/2014  . Chest pain 06/17/2011  . Colon polyp   . Coronary artery disease    aortic stenosis/mild per ECHO  . Dyspnea   . Essential hypertension 07/21/2006   Qualifier: Diagnosis of  By: Hulan Saas, CMA (AAMA), Quita Skye   . FATIGUE 12/31/2009   Qualifier: Diagnosis of  By: Regis Bill MD, [name]   . Generalized osteoarthrosis, involving multiple sites   . GERD (gastroesophageal reflux disease)    dilitation  . HEART MURMUR, SYSTOLIC 7/91/5056   Qualifier: Diagnosis of  By: Regis Bill MD, [name]   . Hx of colonoscopy with polypectomy 07/14/2012   medoff   . Hyperlipidemia   . HYPERLIPIDEMIA 07/21/2006   Qualifier: Diagnosis of  By: Hulan Saas, CMA (AAMA), Quita Skye   . Hypertension   . Impaired fasting glucose    . Obesity   . Personal history of other diseases of digestive system   . PONV (postoperative nausea and vomiting)   . SOB 07/21/2006   PFT's 2005   C/w classic obesity effects with disporportionate ERV reduction      Past Surgical History:  Procedure Laterality Date  . APPENDECTOMY    . CHOLECYSTECTOMY    . FOOT SURGERY     rt  . KNEE SURGERY     rt  . LEFT AND RIGHT HEART CATHETERIZATION WITH CORONARY ANGIOGRAM N/A 10/10/2013   Procedure: LEFT AND RIGHT HEART CATHETERIZATION WITH CORONARY ANGIOGRAM;  Surgeon: Burnell Blanks, MD;  Location: [facility] CATH LAB;  Service: Cardiovascular;  Laterality: N/A;  . RIGHT/LEFT HEART CATH AND CORONARY ANGIOGRAPHY N/A 04/06/2018   Procedure: RIGHT/LEFT HEART CATH AND CORONARY ANGIOGRAPHY;  Surgeon: Burnell Blanks, MD;  Location: [place] CV LAB;  Service: Cardiovascular;  Laterality: N/A;  . TONSILLECTOMY    . TOTAL KNEE ARTHROPLASTY  02/01/2011   Procedure: TOTAL KNEE ARTHROPLASTY;  Surgeon: Kerin Salen;  Location: [facility];  Service: Orthopedics;  Laterality: Right;  DEPUY/SIGMA    Family History  Problem Relation Age of Onset  . Coronary artery disease Other        female- 1st degree relative  . Coronary artery  disease Other        female- 1st degree relative  . Hypertension Other   . Hyperlipidemia Other   . Heart attack Mother        age 76's  . Heart attack Father        age 82's  . Stroke Brother        died   . Hearing loss Son   . Sleep apnea Son   . Aortic stenosis Sister   . Stroke Brother   . Heart attack Brother   . Rheum arthritis Sister   . Osteoarthritis Sister   . Arthritis Sister   . Heart Problems Brother     Social History   Tobacco Use  . Smoking status: Never Smoker  . Smokeless tobacco: Never Used  Substance Use Topics  . Alcohol use: No  . Drug use: No      Current Outpatient Medications:  .  aspirin 81 MG tablet, Take 81 mg by mouth daily., Disp: , Rfl:  .  cholecalciferol (VITAMIN  D) 1000 UNITS tablet, Take 1,000 Units by mouth daily.  , Disp: , Rfl:  .  hydrochlorothiazide (MICROZIDE) 12.5 MG capsule, Take 12.5 mg by mouth daily., Disp: , Rfl:  .  losartan (COZAAR) 25 MG tablet, Take 50 mg by mouth daily., Disp: , Rfl:  .  metoprolol succinate (TOPROL-XL) 50 MG 24 hr tablet, TAKE 1 TABLET BY MOUTH  DAILY (Patient taking differently: Take 50 mg by mouth daily. ), Disp: 90 tablet, Rfl: 1 .  Misc Natural Products (TART CHERRY ADVANCED PO), Take 1 capsule by mouth daily. , Disp: , Rfl:  .  Multiple Vitamin (MULTIVITAMINS PO), Take 1 tablet by mouth daily. , Disp: , Rfl:  .  Omega-3 1000 MG CAPS, Take 1 capsule by mouth every morning. , Disp: , Rfl:  .  omeprazole-sodium bicarbonate (ZEGERID) 40-1100 MG per capsule, Take 1 capsule by mouth every other day. , Disp: , Rfl:  .  Polyethyl Glycol-Propyl Glycol (SYSTANE) 0.4-0.3 % GEL ophthalmic gel, Place 1 application into both eyes 2 (two) times daily., Disp: , Rfl:  .  Respiratory Therapy Supplies (NEBULIZER) DEVI, by Does not apply route., Disp: , Rfl:  .  rosuvastatin (CRESTOR) 40 MG tablet, Take 1 tablet (40 mg total) by mouth every other day., Disp: 90 tablet, Rfl: 1  EXAM: BP Readings from Last 3 Encounters:  04/06/18 (!) 94/59  03/24/18 126/80  03/10/18 124/86    VITALS per patient if applicable:  GENERAL: alert, oriented, appears well and in no acute distress  HEENT: atraumatic, conjunttiva clear, no obvious abnormalities on inspection of external nose and ears  NECK: normal movements of the head and neck  LUNGS: on inspection no signs of respiratory distress, breathing rate appears normal, no obvious gross SOB, gasping or wheezing  CV: no obvious cyanosis  PSYCH/NEURO: pleasant and cooperative, no obvious depression or anxiety, speech and thought processing grossly intact Lab Results  Component Value Date   WBC 6.2 04/03/2018   HGB 10.9 (L) 04/06/2018   HCT 32.0 (L) 04/06/2018   PLT 273 04/03/2018    GLUCOSE 120 (H) 05/26/2018   CHOL 201 (H) 07/04/2017   TRIG 150.0 (H) 07/04/2017   HDL 69.50 07/04/2017   LDLDIRECT 89.0 06/15/2016   LDLCALC 102 (H) 07/04/2017   ALT 25 07/04/2017   AST 18 07/04/2017   NA 138 05/26/2018   K 4.4 05/26/2018   CL 97 05/26/2018   CREATININE 1.04 (H) 05/26/2018  BUN 13 05/26/2018   CO2 27 05/26/2018   TSH 1.05 04/16/2015   INR 0.93 10/08/2013   HGBA1C 6.7 (A) 02/27/2018   Wt Readings from Last 3 Encounters:  04/06/18 240 lb (108.9 kg)  03/24/18 247 lb (112 kg)  03/10/18 245 lb (111.1 kg)    ASSESSMENT AND PLAN:  Discussed the following assessment and plan:  Fasting hyperglycemia early dm by a1c  - see text   delaying a1c testing  random bg 120 and is to ahve surgery next week.   Morbid obesity ([place])  Severe aortic valve stenosis Plan was to fu her hg a1c and weight however she is to have her AV surgery next week  And her random  bgf was  Acceptable 120  So not felt need to have her  Return for a1c only  Pre surgery .    Plan a fu  In August  And can do labs then   Counseled.   Surgery in covid times  And fu  Best success  In her surgery .   Expectant management and discussion of plan and treatment with opportunity to ask questions and all were answered. The patient agreed with the plan and demonstrated an understanding of the instructions.    Advised to call back or seek an in-person evaluation if worsening  or having  further concerns . I interim    Shanon Ace, MD

## 2018-05-31 ENCOUNTER — Ambulatory Visit: Payer: Medicare Other | Admitting: Physical Therapy

## 2018-05-31 ENCOUNTER — Ambulatory Visit (HOSPITAL_BASED_OUTPATIENT_CLINIC_OR_DEPARTMENT_OTHER)
Admission: RE | Admit: 2018-05-31 | Discharge: 2018-05-31 | Disposition: A | Discharge status: Home / Self Care | Payer: Medicare Other | Source: Ambulatory Visit | Attending: Cardiovascular Disease | Admitting: Cardiovascular Disease

## 2018-05-31 ENCOUNTER — Ambulatory Visit (HOSPITAL_COMMUNITY)
Admission: RE | Admit: 2018-05-31 | Discharge: 2018-05-31 | Disposition: A | Discharge status: Home / Self Care | Payer: Medicare Other | Source: Ambulatory Visit | Attending: Cardiovascular Disease | Admitting: Cardiovascular Disease

## 2018-05-31 ENCOUNTER — Other Ambulatory Visit: Payer: Self-pay

## 2018-05-31 ENCOUNTER — Encounter: Payer: Self-pay | Admitting: Physical Therapy

## 2018-05-31 DIAGNOSIS — R0609 Other forms of dyspnea: Secondary | ICD-10-CM | POA: Diagnosis not present

## 2018-05-31 DIAGNOSIS — R293 Abnormal posture: Secondary | ICD-10-CM

## 2018-05-31 DIAGNOSIS — I350 Nonrheumatic aortic (valve) stenosis: Secondary | ICD-10-CM

## 2018-05-31 DIAGNOSIS — R2689 Other abnormalities of gait and mobility: Secondary | ICD-10-CM | POA: Insufficient documentation

## 2018-05-31 MED ORDER — IOHEXOL 350 MG/ML SOLN
100.0000 mL | Freq: Once | INTRAVENOUS | Status: AC | PRN
  Administered 2018-05-31: 100 mL via INTRAVENOUS

## 2018-05-31 NOTE — Therapy (Signed)
[place], [place], [address] Phone: [phone]   Fax:  [phone]  Physical Therapy Evaluation  Patient Details  Name: Anna Gamble MRN: [id number] Date of Birth: Nov 26, 1935 Referring Provider (PT): Burnell Blanks, MD   Encounter Date: 05/31/2018  PT End of Session - 05/31/18 0837    Visit Number  1    Number of Visits  1    Date for PT Re-Evaluation  05/31/18    PT Start Time  0800    PT Stop Time  0834    PT Time Calculation (min)  34 min    Activity Tolerance  Patient tolerated treatment well    Behavior During Therapy  [facility] for tasks assessed/performed       Past Medical History:  Diagnosis Date  . Aortic stenosis   . Aortic valve disorder 06/18/2008   Qualifier: Diagnosis of  By: [name], MD, Kandyce Rud   . CAD (coronary artery disease) 03/19/2014  . Chest pain 06/17/2011  . Colon polyp   . Coronary artery disease    aortic stenosis/mild per ECHO  . Dyspnea   . Essential hypertension 07/21/2006   Qualifier: Diagnosis of  By: Hulan Saas, CMA (AAMA), Quita Skye   . FATIGUE 12/31/2009   Qualifier: Diagnosis of  By: Regis Bill MD, [name]   . Generalized osteoarthrosis, involving multiple sites   . GERD (gastroesophageal reflux disease)    dilitation  . HEART MURMUR, SYSTOLIC 0/35/4656   Qualifier: Diagnosis of  By: Regis Bill MD, [name]   . Hx of colonoscopy with polypectomy 07/14/2012   medoff   . Hyperlipidemia   . HYPERLIPIDEMIA 07/21/2006   Qualifier: Diagnosis of  By: Hulan Saas, CMA (AAMA), Quita Skye   . Hypertension   . Impaired fasting glucose   . Obesity   . Personal history of other diseases of digestive system   . PONV (postoperative nausea and vomiting)   . SOB 07/21/2006   PFT's 2005   C/w classic obesity effects with disporportionate ERV reduction      Past Surgical History:  Procedure Laterality Date  . APPENDECTOMY    . CHOLECYSTECTOMY    . FOOT SURGERY     rt   . KNEE SURGERY     rt  . LEFT AND RIGHT HEART CATHETERIZATION WITH CORONARY ANGIOGRAM N/A 10/10/2013   Procedure: LEFT AND RIGHT HEART CATHETERIZATION WITH CORONARY ANGIOGRAM;  Surgeon: Burnell Blanks, MD;  Location: [facility] CATH LAB;  Service: Cardiovascular;  Laterality: N/A;  . RIGHT/LEFT HEART CATH AND CORONARY ANGIOGRAPHY N/A 04/06/2018   Procedure: RIGHT/LEFT HEART CATH AND CORONARY ANGIOGRAPHY;  Surgeon: Burnell Blanks, MD;  Location: [place] CV LAB;  Service: Cardiovascular;  Laterality: N/A;  . TONSILLECTOMY    . TOTAL KNEE ARTHROPLASTY  02/01/2011   Procedure: TOTAL KNEE ARTHROPLASTY;  Surgeon: Kerin Salen;  Location: [facility];  Service: Orthopedics;  Laterality: Right;  DEPUY/SIGMA    There were no vitals filed for this visit.   Subjective Assessment - 05/31/18 0806    Subjective  pt is a 83 y.o F with CC of SOB and assocaited fatigue which she notes has been gradually worsened over the the last 5-6 years. she reports occasional chest pain/ tightness located more between her shoulders. She has a chronic hx of low back pain and L foot pain.     Patient Stated Goals  to get heart better    Currently in Pain?  Yes  Pain Score  10-Worst pain ever    Pain Location  Foot    Pain Descriptors / Indicators  Aching;Sore    Pain Type  Chronic pain    Pain Onset  More than a month ago    Pain Frequency  Constant    Aggravating Factors   walking, standing    Pain Relieving Factors  sitting/ resting         [facility] PT Assessment - 05/31/18 0803      Assessment   Medical Diagnosis  Severe Aortic Stenosis    Referring Provider (PT)  Burnell Blanks, MD    Onset Date/Surgical Date  --   5-6 years   Hand Dominance  Right      Precautions   Precautions  None      Restrictions   Weight Bearing Restrictions  No      Balance Screen   Has the patient fallen in the past 6 months  Yes    How many times?  1    Has the patient had a decrease in activity level  because of a fear of falling?   No    Is the patient reluctant to leave their home because of a fear of falling?   No      Home Social worker  Private residence    Living Arrangements  Spouse/significant other;Children    Available Help at Discharge  Family    Type of [place] Access  Level entry    [place] - single point      ROM / Strength   AROM / PROM / Strength  AROM;Strength      AROM   Overall AROM   Within functional limits for tasks performed    Overall AROM Comments  mild limitation notedwith R shoulder IR/ ER       Strength   Overall Strength Comments  4-/5 in L hip flexion, bil UE overall general weakness at 4-/5    Strength Assessment Site  Hand    Right Hand Grip (lbs)  30    Left Hand Grip (lbs)  29      Ambulation/Gait   Ambulation/Gait  Yes    Assistive device  Straight cane    Gait Pattern  Step-through pattern;Decreased stride length;Trendelenburg;Antalgic;Decreased trunk rotation;Trunk flexed    Gait Comments  PT amb 220 ft requiring rest break at 1:41 lasting 50 seconds and was able to ambulate and additional 220 ft requing requiring rest to the end of the testing       [facility] Pre-Surgical Assessment - 05/31/18 0001    5 Meter Walk Test- trial 1  5 sec    5 Meter Walk Test- trial 2  4 sec.     5 Meter Walk Test- trial 3  5 sec.    5 meter walk test average  4.67 sec    4 Stage Balance Test tolerated for:   3 sec.    4 Stage Balance Test Position  2    comment  significant postural sway    Sit To Stand Test- trial 1  13 sec.    Comment  difficulty controlling descent on last rep    ADL/IADL Independent with:  Bathing;Dressing    ADL/IADL Needs Assistance with:  Meal prep;Finances;Yard work    ADL/IADL Therapist, sports Index  Moderately frail    6 Minute Walk- Baseline  yes    BP (mmHg)  126/75    HR (bpm)  71    02 Sat (%RA)  96 %    Modified Borg Scale for Dyspnea  3- Moderate shortness  of breath or breathing difficulty    Perceived Rate of Exertion (Borg)  6-    6 Minute Walk Post Test  yes    BP (mmHg)  114/60    HR (bpm)  50    02 Sat (%RA)  92 %    Modified Borg Scale for Dyspnea  4- somewhat severe    Perceived Rate of Exertion (Borg)  15- Hard    Aerobic Endurance Distance Walked  440    Endurance additional comments  pt demonstrates 65/79% limitation compared to age related norm              Objective measurements completed on examination: See above findings.                           Plan - 05/31/18 0837    Clinical Impression Statement  see assessment in note    Clinical Decision Making  Low    PT Frequency  One time visit    PT Next Visit Plan  Pre-TAVR evaluation    Consulted and Agree with Plan of Care  Patient       Clinical Impression Statement: Pt is a 83 yo F presenting to OP PT for evaluation prior to possible TAVR surgery due to severe aortic stenosis. Pt reports onset of SOB/ chest tightness approximately 5-6 years ago. Symptoms are limiting balance, and endurance. Pt presents with functional ROM and strength, limited  balance and is moderate at high fall risk 4 stage balance test, fair walking speed and limited aerobic endurance per 6 minute walk test. PT amb 220 ft requiring rest break at 1:41 lasting 50 seconds and was able to ambulate and additional 220 ft requing requiring rest to the end of the testing, patient's HR was 50 bpm and O2 was 92% on room air. Pt reported 4/10 shortness of breath on modified scale for dyspnea.  Pt ambulated a total of 440 feet in 6 minute walk. SOB and fatigue increased significantly with 6 minute walk test. Based on the Short Physical Performance Battery, patient has a frailty rating of 9/12 with </= 5/12 considered frail.    Patient demonstrated the following deficits and impairments:     Visit Diagnosis: Other abnormalities of gait and mobility  Abnormal posture     Problem  List Patient Active Problem List   Diagnosis Date Noted  . Severe aortic valve stenosis   . Osteopenia of multiple sites 04/27/2017  . Aortic stenosis 04/16/2015  . CAD (coronary artery disease) 03/19/2014  . Generalized abdominal discomfort 01/15/2013  . Back pain 01/15/2013  . Hx of colonoscopy with polypectomy 07/14/2012  . Chest pain 06/17/2011  . Arthritis of knee, right 01/31/2011  . GERD (gastroesophageal reflux disease)   . FOOT PAIN, LEFT 12/31/2009  . FATIGUE 12/31/2009  . COUGH 08/21/2009  . Aortic valve disorder 06/18/2008  . HEADACHE 04/15/2008  . HEART MURMUR, SYSTOLIC 40/08/6759  . UNSPECIFIED OSTEOPOROSIS 03/26/2008  . GEN OSTEOARTHROSIS INVOLVING MULTIPLE SITES 06/05/2007  . IMPAIRED FASTING GLUCOSE 03/23/2007  . FASTING HYPERGLYCEMIA 12/27/2006  . HYPERLIPIDEMIA 07/21/2006  . Obesity 07/21/2006  . Essential hypertension 07/21/2006  . GERD 07/21/2006  . LOW BACK PAIN 07/21/2006  . SOB 07/21/2006  . DIVERTICULITIS, HX OF  07/21/2006   Starr Lake PT, DPT, LAT, ATC  05/31/18  8:43 AM      [facility] [address] [place], [place], [address] Phone: [phone]   Fax:  [phone]  Name: Anna Gamble MRN: [id number] Date of Birth: February 16, 1935

## 2018-06-01 ENCOUNTER — Other Ambulatory Visit: Payer: Self-pay

## 2018-06-01 ENCOUNTER — Telehealth (HOSPITAL_COMMUNITY): Payer: Self-pay | Admitting: Emergency Medicine

## 2018-06-01 DIAGNOSIS — I350 Nonrheumatic aortic (valve) stenosis: Secondary | ICD-10-CM

## 2018-06-01 NOTE — Pre-Procedure Instructions (Addendum)
Anna Gamble  06/01/2018      [facility] [place], [place] AT [facility] [address] [place] [address] Phone: [phone] Fax: [phone]    Your procedure is scheduled on Tues., Jun 06, 2018 from 2:53PM-4:58PM  Report to [facility] Entrance "A" at 12:30PM  Call this number if you have problems the morning of surgery:  [phone]   Remember:  Do not eat or drink after midnight on May 18th    Take these medicines the morning of surgery with A SIP OF WATER: NONE  Take last dose of Aspirin on 5/18  As of today, stop taking all Other Aspirin Products, Vitamins, Fish oils, and Herbal medications. Also stop all NSAIDS i.e. Advil, Ibuprofen, Motrin, Aleve, Anaprox, Naproxen, BC, Goody Powders, and all Supplements.    Do not wear jewelry, make-up or nail polish.  Do not wear lotions, powders, or perfumes, or deodorant.  Do not shave 48 hours prior to surgery.  .  Do not bring valuables to the hospital.  [facility] is not responsible for any belongings or valuables.  Contacts, dentures or bridgework may not be worn into surgery.    For patients admitted to the hospital, discharge time will be determined by your treatment team.  Patients discharged the day of surgery will not be allowed to drive home.   Special instructions:   [place]- Preparing For Surgery  Before surgery, you can play an important role. Because skin is not sterile, your skin needs to be as free of germs as possible. You can reduce the number of germs on your skin by washing with CHG (chlorahexidine gluconate) Soap before surgery.  CHG is an antiseptic cleaner which kills germs and bonds with the skin to continue killing germs even after washing.    Oral Hygiene is also important to reduce your risk of infection.  Remember - BRUSH YOUR TEETH THE MORNING OF SURGERY WITH YOUR REGULAR TOOTHPASTE  Please do not use if you have an  allergy to CHG or antibacterial soaps. If your skin becomes reddened/irritated stop using the CHG.  Do not shave (including legs and underarms) for at least 48 hours prior to first CHG shower. It is OK to shave your face.  Please follow these instructions carefully.   1. Shower the NIGHT BEFORE SURGERY and the MORNING OF SURGERY with CHG.   2. If you chose to wash your hair, wash your hair first as usual with your normal shampoo.  3. After you shampoo, rinse your hair and body thoroughly to remove the shampoo.  4. Use CHG as you would any other liquid soap. You can apply CHG directly to the skin and wash gently with a scrungie or a clean washcloth.   5. Apply the CHG Soap to your body ONLY FROM THE NECK DOWN.  Do not use on open wounds or open sores. Avoid contact with your eyes, ears, mouth and genitals (private parts). Wash Face and genitals (private parts)  with your normal soap.  6. Wash thoroughly, paying special attention to the area where your surgery will be performed.  7. Thoroughly rinse your body with warm water from the neck down.  8. DO NOT shower/wash with your normal soap after using and rinsing off the CHG Soap.  9. Pat yourself dry with a CLEAN TOWEL.  10. Wear CLEAN PAJAMAS to bed the night before surgery, wear comfortable  clothes the morning of surgery  11. Place CLEAN SHEETS on your bed the night of your first shower and DO NOT SLEEP WITH PETS.  Day of Surgery:  Do not apply any deodorants/lotions.  Please wear clean clothes to the hospital/surgery center.   Remember to brush your teeth WITH YOUR REGULAR TOOTHPASTE.  Please read over the following fact sheets that you were given. Pain Booklet, Coughing and Deep Breathing, Blood Transfusion Information, MRSA Information and Surgical Site Infection Prevention

## 2018-06-01 NOTE — Telephone Encounter (Signed)
error 

## 2018-06-02 ENCOUNTER — Encounter (HOSPITAL_COMMUNITY): Payer: Self-pay

## 2018-06-02 ENCOUNTER — Institutional Professional Consult (permissible substitution) (INDEPENDENT_AMBULATORY_CARE_PROVIDER_SITE_OTHER): Payer: Medicare Other | Admitting: Surgery

## 2018-06-02 ENCOUNTER — Encounter (HOSPITAL_COMMUNITY)
Admission: RE | Admit: 2018-06-02 | Discharge: 2018-06-02 | Disposition: A | Discharge status: Home / Self Care | Payer: Medicare Other | Source: Ambulatory Visit | Attending: Cardiovascular Disease | Admitting: Cardiovascular Disease

## 2018-06-02 ENCOUNTER — Other Ambulatory Visit (HOSPITAL_COMMUNITY)
Admission: RE | Admit: 2018-06-02 | Discharge: 2018-06-02 | Disposition: A | Discharge status: Home / Self Care | Payer: Medicare Other | Source: Ambulatory Visit | Attending: Cardiovascular Disease | Admitting: Cardiovascular Disease

## 2018-06-02 ENCOUNTER — Other Ambulatory Visit: Payer: Self-pay

## 2018-06-02 ENCOUNTER — Ambulatory Visit (HOSPITAL_COMMUNITY)
Admission: RE | Admit: 2018-06-02 | Discharge: 2018-06-02 | Disposition: A | Discharge status: Home / Self Care | Payer: Medicare Other | Source: Ambulatory Visit | Attending: Cardiovascular Disease | Admitting: Cardiovascular Disease

## 2018-06-02 ENCOUNTER — Encounter: Payer: Self-pay | Admitting: Surgery

## 2018-06-02 VITALS — BP 130/68 | HR 72 | Temp 97.7°F | Resp 20 | Ht 62.0 in | Wt 245.0 lb

## 2018-06-02 DIAGNOSIS — Z79899 Other long term (current) drug therapy: Secondary | ICD-10-CM | POA: Insufficient documentation

## 2018-06-02 DIAGNOSIS — Z8249 Family history of ischemic heart disease and other diseases of the circulatory system: Secondary | ICD-10-CM | POA: Diagnosis not present

## 2018-06-02 DIAGNOSIS — Z01818 Encounter for other preprocedural examination: Secondary | ICD-10-CM | POA: Insufficient documentation

## 2018-06-02 DIAGNOSIS — I2510 Atherosclerotic heart disease of native coronary artery without angina pectoris: Secondary | ICD-10-CM | POA: Insufficient documentation

## 2018-06-02 DIAGNOSIS — Z7982 Long term (current) use of aspirin: Secondary | ICD-10-CM | POA: Insufficient documentation

## 2018-06-02 DIAGNOSIS — I10 Essential (primary) hypertension: Secondary | ICD-10-CM | POA: Insufficient documentation

## 2018-06-02 DIAGNOSIS — Z1159 Encounter for screening for other viral diseases: Secondary | ICD-10-CM | POA: Insufficient documentation

## 2018-06-02 DIAGNOSIS — I350 Nonrheumatic aortic (valve) stenosis: Secondary | ICD-10-CM | POA: Diagnosis not present

## 2018-06-02 DIAGNOSIS — E785 Hyperlipidemia, unspecified: Secondary | ICD-10-CM | POA: Insufficient documentation

## 2018-06-02 LAB — CBC
HCT: 38.7 % (ref 36.0–46.0)
Hemoglobin: 12.9 g/dL (ref 12.0–15.0)
MCH: 32.3 pg (ref 26.0–34.0)
MCHC: 33.3 g/dL (ref 30.0–36.0)
MCV: 97 fL (ref 80.0–100.0)
Platelets: 235 10*3/uL (ref 150–400)
RBC: 3.99 MIL/uL (ref 3.87–5.11)
RDW: 13.2 % (ref 11.5–15.5)
WBC: 6.1 10*3/uL (ref 4.0–10.5)
nRBC: 0 % (ref 0.0–0.2)

## 2018-06-02 LAB — COMPREHENSIVE METABOLIC PANEL
ALT: 33 U/L (ref 0–44)
AST: 27 U/L (ref 15–41)
Albumin: 3.6 g/dL (ref 3.5–5.0)
Alkaline Phosphatase: 69 U/L (ref 38–126)
Anion gap: 11 (ref 5–15)
BUN: 12 mg/dL (ref 8–23)
CO2: 24 mmol/L (ref 22–32)
Calcium: 9.2 mg/dL (ref 8.9–10.3)
Chloride: 103 mmol/L (ref 98–111)
Creatinine, Ser: 0.85 mg/dL (ref 0.44–1.00)
GFR calc Af Amer: >60 mL/min (ref >60)
GFR calc non Af Amer: >60 mL/min (ref >60)
Glucose, Bld: 147 mg/dL — ABNORMAL HIGH (ref 70–99)
Potassium: 4.1 mmol/L (ref 3.5–5.1)
Sodium: 138 mmol/L (ref 135–145)
Total Bilirubin: 0.7 mg/dL (ref 0.3–1.2)
Total Protein: 6 g/dL — ABNORMAL LOW (ref 6.5–8.1)

## 2018-06-02 LAB — BLOOD GAS, ARTERIAL
Acid-Base Excess: 4.7 mmol/L — ABNORMAL HIGH (ref 0.0–2.0)
Bicarbonate: 28 mmol/L (ref 20.0–28.0)
Drawn by: 470591
FIO2: 21
O2 Saturation: 89 %
Patient temperature: 98.6
pCO2 arterial: 36.8 mmHg (ref 32.0–48.0)
pH, Arterial: 7.494 — ABNORMAL HIGH (ref 7.350–7.450)
pO2, Arterial: 52.7 mmHg — ABNORMAL LOW (ref 83.0–108.0)

## 2018-06-02 LAB — SURGICAL PCR SCREEN
MRSA, PCR: NEGATIVE
Staphylococcus aureus: NEGATIVE

## 2018-06-02 LAB — URINALYSIS, ROUTINE W REFLEX MICROSCOPIC
Bilirubin Urine: NEGATIVE
Glucose, UA: NEGATIVE mg/dL
Hgb urine dipstick: NEGATIVE
Ketones, ur: NEGATIVE mg/dL
Leukocytes,Ua: NEGATIVE
Nitrite: NEGATIVE
Protein, ur: NEGATIVE mg/dL
Specific Gravity, Urine: 1.015 (ref 1.005–1.030)
pH: 7 (ref 5.0–8.0)

## 2018-06-02 LAB — HEMOGLOBIN A1C
Hgb A1c MFr Bld: 7.1 % — ABNORMAL HIGH (ref 4.8–5.6)
Mean Plasma Glucose: 157.07 mg/dL

## 2018-06-02 LAB — PROTIME-INR
INR: 1 (ref 0.8–1.2)
Prothrombin Time: 13.3 seconds (ref 11.4–15.2)

## 2018-06-02 LAB — TYPE AND SCREEN
ABO/RH(D): B POS
Antibody Screen: NEGATIVE

## 2018-06-02 LAB — APTT: aPTT: 32 seconds (ref 24–36)

## 2018-06-02 LAB — BRAIN NATRIURETIC PEPTIDE: B Natriuretic Peptide: 76.9 pg/mL (ref 0.0–100.0)

## 2018-06-02 NOTE — Progress Notes (Signed)
Patient ID: Anna Gamble, female   DOB: 13-Jan-1936, 83 y.o.   MRN: [id number]  [name] REPORT  Referring Provider is [name], MD Primary Cardiologist is [name]. PCP is [name], [name], MD  Chief Complaint  Patient presents with  . Aortic Stenosis    Surgical eval for TAVR, review all studies    HPI:  The patient is an 83 year old woman with a history of HTN, hyperlipidemia and moderate aortic stenosis that has been followed by [name] with serial echocardiograms. She now has progressively worsening shortness of breath and fatigue as well as lower extremity swelling. Her most recent echo on 03/24/2018 showed an increase in the mean AV gradient to 46 mm Hg with a peak of 70.2 mm Hg and an AVA of 0.64 cm2. LVEF was normal. Cardiac cath showed mild non-obstructive CAD. The mean gradient was 28.7 with a peak of 39.   She is married and lives at home with her husband.  Past Medical History:  Diagnosis Date  . Aortic stenosis   . Aortic valve disorder 06/18/2008   Qualifier: Diagnosis of  By: [name], MD, Kandyce Rud   . CAD (coronary artery disease) 03/19/2014  . Chest pain 06/17/2011  . Colon polyp   . Coronary artery disease    aortic stenosis/mild per ECHO  . Dyspnea   . Essential hypertension 07/21/2006   Qualifier: Diagnosis of  By: Hulan Saas, CMA (AAMA), Quita Skye   . FATIGUE 12/31/2009   Qualifier: Diagnosis of  By: Regis Bill MD, [name]   . Generalized osteoarthrosis, involving multiple sites   . GERD (gastroesophageal reflux disease)    dilitation  . HEART MURMUR, SYSTOLIC 2/83/1517   Qualifier: Diagnosis of  By: Regis Bill MD, [name]   . Hx of colonoscopy with polypectomy 07/14/2012   medoff   . Hyperlipidemia   . HYPERLIPIDEMIA 07/21/2006   Qualifier: Diagnosis of  By: Hulan Saas, CMA (AAMA), Quita Skye   . Hypertension   . Impaired fasting  glucose   . Obesity   . Personal history of other diseases of digestive system   . PONV (postoperative nausea and vomiting)   . SOB 07/21/2006   PFT's 2005   C/w classic obesity effects with disporportionate ERV reduction      Past Surgical History:  Procedure Laterality Date  . APPENDECTOMY    . CHOLECYSTECTOMY    . FOOT SURGERY     rt  . KNEE SURGERY     rt  . LEFT AND RIGHT HEART CATHETERIZATION WITH CORONARY ANGIOGRAM N/A 10/10/2013   Procedure: LEFT AND RIGHT HEART CATHETERIZATION WITH CORONARY ANGIOGRAM;  Surgeon: Burnell Blanks, MD;  Location: [facility] CATH LAB;  Service: Cardiovascular;  Laterality: N/A;  . RIGHT/LEFT HEART CATH AND CORONARY ANGIOGRAPHY N/A 04/06/2018   Procedure: RIGHT/LEFT HEART CATH AND CORONARY ANGIOGRAPHY;  Surgeon: Burnell Blanks, MD;  Location: [facility] CV LAB;  Service: Cardiovascular;  Laterality: N/A;  . TONSILLECTOMY    . TOTAL KNEE ARTHROPLASTY  02/01/2011   Procedure: TOTAL KNEE ARTHROPLASTY;  Surgeon: Kerin Salen;  Location: [facility];  Service: Orthopedics;  Laterality: Right;  DEPUY/SIGMA    Family History  Problem Relation Age of Onset  . Coronary artery disease Other        female- 1st degree relative  . Coronary artery disease Other        female- 1st  degree relative  . Hypertension Other   . Hyperlipidemia Other   . Heart attack Mother        age 35's  . Heart attack Father        age 77's  . Stroke Brother        died   . Hearing loss Son   . Sleep apnea Son   . Aortic stenosis Sister   . Stroke Brother   . Heart attack Brother   . Rheum arthritis Sister   . Osteoarthritis Sister   . Arthritis Sister   . Heart Problems Brother     Social History   Socioeconomic History  . Marital status: Married    Spouse name: Not on file  . Number of children: 4  . Years of education: Not on file  . Highest education level: Not on file  Occupational History  . Occupation: Retired    Comment: Takes care of [employer]  . Occupation: Therapist, sports, peds, cardiology    Comment: retired  Scientific laboratory technician  . Financial resource strain: Not hard at all  . Food insecurity:    Worry: Never true    Inability: Never true  . Transportation needs:    Medical: No    Non-medical: No  Tobacco Use  . Smoking status: Never Smoker  . Smokeless tobacco: Never Used  Substance and Sexual Activity  . Alcohol use: No  . Drug use: No  . Sexual activity: Not Currently    Comment: quit 40 yrs ago  Lifestyle  . Physical activity:    Days per week: 0 days    Minutes per session: 0 min  . Stress: Only a little  Relationships  . Social connections:    Talks on phone: More than three times a week    Gets together: Once a week    Attends religious service: More than 4 times per year    Active member of club or organization: Yes    Attends meetings of clubs or organizations: More than 4 times per year    Relationship status: Married  . Intimate partner violence:    Fear of current or ex partner: No    Emotionally abused: No    Physically abused: No    Forced sexual activity: No  Other Topics Concern  . Not on file  Social History Narrative   hh of 3 -4  Married   [place] son   At home    Invalid son paraplegia and husband    And grandson recently     No pets   Fluctuating hh membors she cooks and prepares for them           Current Outpatient Medications  Medication Sig Dispense Refill  . aspirin 81 MG tablet Take 81 mg by mouth daily.    . calcium carbonate (TUMS - DOSED IN MG ELEMENTAL CALCIUM) 500 MG chewable tablet Chew 2 tablets by mouth daily as needed for indigestion or heartburn.    . cholecalciferol (VITAMIN D) 1000 UNITS tablet Take 1,000 Units by mouth daily.      . hydrochlorothiazide (MICROZIDE) 12.5 MG capsule Take 12.5 mg by mouth daily.    Marland Kitchen ibuprofen (ADVIL) 200 MG tablet Take 400 mg by mouth every 6 (six) hours as needed for headache or moderate pain.    Marland Kitchen losartan (COZAAR) 25 MG tablet Take 50 mg by  mouth daily.    . Menthol, Topical Analgesic, (BENGAY EX) Apply 1 application topically daily  as needed (back pain).    . metoprolol succinate (TOPROL-XL) 50 MG 24 hr tablet TAKE 1 TABLET BY MOUTH  DAILY (Patient taking differently: Take 50 mg by mouth daily. ) 90 tablet 1  . Misc Natural Products (TART CHERRY ADVANCED PO) Take 1 capsule by mouth daily.     . Multiple Vitamin (MULTIVITAMINS PO) Take 1 tablet by mouth daily.     . Omega-3 1000 MG CAPS Take 1,000 mg by mouth every morning.     Marland Kitchen omeprazole-sodium bicarbonate (ZEGERID) 40-1100 MG per capsule Take 1 capsule by mouth 2 (two) times a week. Tuesdays and Thursdays    . Polyethyl Glycol-Propyl Glycol (SYSTANE) 0.4-0.3 % GEL ophthalmic gel Place 1 application into both eyes 2 (two) times daily.    Marland Kitchen Respiratory Therapy Supplies (NEBULIZER) DEVI by Does not apply route.    . rosuvastatin (CRESTOR) 40 MG tablet Take 1 tablet (40 mg total) by mouth every other day. (Patient taking differently: Take 20 mg by mouth every other day. ) 90 tablet 1   No current facility-administered medications for this visit.     Allergies  Allergen Reactions  . Lisinopril Cough  . Statins     muscle aches with some statins, tolerates low doses of crestor      Review of Systems:   General:  normal appetite, decreased energy, no weight gain, no weight loss, no fever  Cardiac:  no chest pain with exertion, no chest pain at rest, +SOB with mild exertion, + resting SOB, no PND, + orthopnea, no palpitations, no arrhythmia, no atrial fibrillation, + LE edema, no dizzy spells, no syncope  Respiratory:  + shortness of breath, no home oxygen, no productive cough, no dry cough, no bronchitis, no wheezing, no hemoptysis, no asthma, no pain with inspiration or cough, no sleep apnea, no CPAP at night  GI:   no difficulty swallowing, no reflux, no frequent heartburn, no hiatal hernia, no abdominal pain, no constipation, no diarrhea, no hematochezia, no hematemesis, no  melena  GU:   no dysuria,  no frequency, no urinary tract infection, no hematuria,  no kidney stones, no kidney disease  Vascular:  no pain suggestive of claudication, no pain in feet, no leg cramps, no varicose veins, no DVT, no non-healing foot ulcer  Neuro:   no stroke, no TIA's, no seizures, no headaches, no temporary blindness one eye,  no slurred speech, no peripheral neuropathy, no chronic pain, no instability of gait, no memory/cognitive dysfunction  Musculoskeletal: + arthritis, + joint swelling, no myalgias, + difficulty walking, reduced mobility   Skin:   no rash, no itching, no skin infections, no pressure sores or ulcerations  Psych:   no anxiety, no depression, no nervousness, no unusual recent stress  Eyes:   no blurry vision, no floaters, no recent vision changes, wears glasses   ENT:   + hearing loss, no loose or painful teeth, no dentures, last saw dentist October 2019  Hematologic:  + easy bruising, no abnormal bleeding, no clotting disorder, no frequent epistaxis  Endocrine:  no diabetes, does not check CBG's at home      Physical Exam:   BP 130/68   Pulse 72   Temp 97.7 F (36.5 C) (Skin)   Resp 20   Ht 5\' 2"  (1.575 m)   Wt 245 lb (111.1 kg)   SpO2 97% Comment: RA  BMI 44.81 kg/m   General:  Obese,  well-appearing  HEENT:  Unremarkable, NCAT, PERLA, EOMI  Neck:  no JVD, no bruits, no adenopathy or thyromegaly  Chest:   clear to auscultation, symmetrical breath sounds, no wheezes, no rhonchi   CV:   RRR, grade III/VI crescendo/decrescendo murmur heard best at RSB,  no diastolic murmur  Abdomen:  soft, non-tender, no masses   Extremities:  warm, well-perfused, pulses not palpable, mild bilateral  LE edema  Rectal/GU  Deferred  Neuro:   Grossly non-focal and symmetrical throughout  Skin:   Clean and dry, no rashes, no breakdown   Diagnostic Tests:  Patient Name:   AUNESTY TYSON Date of Exam: 03/24/2018 Medical Rec #:  [id number]        Height:       62.0 in  Accession #:    [id number]       Weight:       247.0 lb Date of Birth:  Aug 21, 1935        BSA:          2.09 m Patient Age:    84 years         BP:           126/80 mmHg Patient Gender: F                HR:           68 bpm. Exam Location:  [address]    Procedure: 2D Echo, Cardiac Doppler, Color Doppler and Strain Analysis  Indications:    I35.0 Aortic valve disease.   History:        Patient has prior history of Echocardiogram examinations, most                 recent 10/27/2017. CAD, Aortic Valve Disease; Signs/Symptoms:                 Murmur and Dyspnea; Risk Factors: Dyslipidemia, Hypertension and                 Morbid obesity.   Sonographer:    Lenard Galloway BA, RDCS Referring Phys: [place]    1. The left ventricle has hyperdynamic systolic function, with an ejection fraction of >65%. The cavity size was normal. There is mildly increased left ventricular wall thickness. Left ventricular diastolic Doppler parameters are consistent with  impaired relaxation.  2. The right ventricle has normal systolic function. The cavity was normal. There is no increase in right ventricular wall thickness.  3. The mitral valve is normal in structure.  4. The tricuspid valve is normal in structure.  5. The aortic valve was not well visualized.  6. AV is difficult to see well Peak and mean gradients through the valve are 68 and 43 mm Hg respectively consistent with severe AS Since previous echo from October 2019, these gradients are increased.  FINDINGS  Left Ventricle: The left ventricle has hyperdynamic systolic function, with an ejection fraction of >65%. The cavity size was normal. There is mildly increased left ventricular wall thickness. Left ventricular diastolic Doppler parameters are consistent  with impaired relaxation Right Ventricle: The right ventricle has normal systolic function. The cavity was normal. There is no increase in right ventricular  wall thickness. Left Atrium: left atrial size was normal in size Right Atrium: right atrial size was normal in size. Right atrial pressure is estimated at 3 mmHg. Interatrial Septum: No atrial level shunt detected by color flow Doppler. Pericardium: There is no evidence of pericardial effusion. Mitral Valve: The mitral valve is normal in structure.  Mitral valve regurgitation is trivial by color flow Doppler. Tricuspid Valve: The tricuspid valve is normal in structure. Tricuspid valve regurgitation is trivial by color flow Doppler. Aortic Valve: The aortic valve was not well visualized Aortic valve regurgitation was not visualized by color flow Doppler. Pulmonic Valve: The pulmonic valve was grossly normal. Pulmonic valve regurgitation is not visualized by color flow Doppler. Additional Comments: AV is difficult to see well Peak and mean gradients through the valve are 68 and 43 mm Hg respectively consistent with severe AS Since previous echo from October 2019, these gradients are increased. Compared to previous exam: LVEF 65%. Moderate aortic stenosis mean gradient of 32 mm Hg and max gradient 56 mmHg.   LEFT VENTRICLE PLAX 2D (Teich) LV EF:          65.9 %   Diastology LVIDd:          4.15 cm  LV e' lateral:   9.57 cm/s LVIDs:          2.66 cm  LV E/e' lateral: 10.6 LV PW:          1.01 cm  LV e' medial:    6.64 cm/s LV IVS:         1.45 cm  LV E/e' medial:  15.2 LVOT diam:      2.00 cm LV SV:          50 ml LVOT Area:      3.14 cm  RIGHT VENTRICLE RV Basal diam:  3.17 cm RV S prime:     10.20 cm/s TAPSE (M-mode): 2.0 cm  LEFT ATRIUM             Index       RIGHT ATRIUM LA diam:        4.40 cm 2.10 cm/m  RA Pressure: 3 mmHg LA Vol (A2C):   48.5 ml 23.19 ml/m LA Vol (A4C):   48.6 ml 23.24 ml/m LA Biplane Vol: 49.0 ml 23.43 ml/m  AORTIC VALVE AV Area (Vmax):    0.66 cm AV Area (Vmean):   0.64 cm AV Area (VTI):     0.65 cm AV Vmax:           419.00 cm/s AV Vmean:           303.200 cm/s AV VTI:            1.160 m AV Peak Grad:      70.2 mmHg AV Mean Grad:      46.0 mmHg LVOT Vmax:         87.90 cm/s LVOT Vmean:        61.700 cm/s LVOT VTI:          0.241 m LVOT/AV VTI ratio: 0.21   AORTA Ao Root diam: 2.40 cm Ao Asc diam:  2.80 cm  MITRAL VALVE   MV Decel Time: 278 msec MV E velocity: 101.00 cm/s MV A velocity: 95.60 cm/s MV E/A ratio:  1.06    Dorris Carnes MD Electronically signed by Dorris Carnes MD Signature Date/Time: 03/24/2018/6:08:22 PM    Physicians   Panel Physicians Referring Physician Case Authorizing Physician  Burnell Blanks, MD (Primary)    Procedures   RIGHT/LEFT HEART CATH AND CORONARY ANGIOGRAPHY  Conclusion     Ost RCA to Prox RCA lesion is 40% stenosed.  Ost LM to Mid LM lesion is 20% stenosed.   1. Mild non-obstructive CAD 2. Severe aortic stenosis (mean gradient 28.7 mmHg, peak to peak gradient 39  mmHg)  Recommendations: Will continue workup for TAVR.    Recommendations   Antiplatelet/Anticoag Continue workup for TAVR  Indications   Severe aortic stenosis [I35.0 (ICD-10-CM)]  Procedural Details   Technical Details Indication: Severe aortic stenosis.  Procedure: The risks, benefits, complications, treatment options, and expected outcomes were discussed with the patient. The patient and/or family concurred with the proposed plan, giving informed consent. The patient was brought to the cath lab after IV hydration was given. The patient was sedated with Versed and Fentanyl. The IV catheter present in the right antecubital vein was changed for a 5 [place] sheath using sterile prep. Right heart catheterization performed with a balloon tipped catheter. The right wrist was prepped and draped in a sterile fashion. 1% lidocaine was used for local anesthesia. Using the modified Seldinger access technique, a 5 French sheath was placed in the right radial artery. 3 mg Verapamil was given through the sheath. 5000  units IV heparin was given. Standard diagnostic catheters were used to perform selective coronary angiography. I crossed the aortic valve with an AL-1 and a straight wire. The sheath was removed from the right radial artery and a Terumo hemostasis band was applied at the arteriotomy site on the right wrist.    Estimated blood loss <50 mL.   During this procedure medications were administered to achieve and maintain moderate conscious sedation while the patient's heart rate, blood pressure, and oxygen saturation were continuously monitored and I was present face-to-face 100% of this time.  Medications  (Filter: Administrations occurring from 04/06/18 0901 to 04/06/18 1002)  Medication Rate/Dose/Volume Action  Date Time   fentaNYL (SUBLIMAZE) injection (mcg) 25 mcg Given 04/06/18 0913   Total dose as of 06/04/18 0920        25 mcg        midazolam (VERSED) injection (mg) 1 mg Given 04/06/18 0913   Total dose as of 06/04/18 0920        1 mg        lidocaine (PF) (XYLOCAINE) 1 % injection (mL) 2 mL Given 04/06/18 0924   Total dose as of 06/04/18 0920 2 mL Given 0928   4 mL        Radial Cocktail/Verapamil only (mL) 10 mL Given 04/06/18 0928   Total dose as of 06/04/18 0920        10 mL        heparin injection (Units) 5,000 Units Given 04/06/18 0937   Total dose as of 06/04/18 0920        5,000 Units        Heparin (Porcine) in NaCl 1000-0.9 UT/500ML-% SOLN (mL) 500 mL Given 04/06/18 0958   Total dose as of 06/04/18 0920 500 mL Given 0958   1,000 mL        iohexol (OMNIPAQUE) 350 MG/ML injection (mL) 60 mL Given 04/06/18 0958   Total dose as of 06/04/18 0920        60 mL        Sedation Time   Sedation Time Physician-1: 35 minutes 37 seconds  Complications   Complications documented before study signed (04/06/2018 10:11 AM EDT)    RIGHT/LEFT HEART CATH AND CORONARY ANGIOGRAPHY   None Documented by Burnell Blanks, MD 04/06/2018 9:57 AM EDT  Time Range: Intraprocedure       Coronary Findings   Diagnostic  Dominance: Right  Left Main  Ost LM to Mid LM lesion 20% stenosed  Ost LM to Mid LM lesion is 20%  stenosed.  Left Anterior Descending  Vessel is moderate in size.  Left Circumflex  Vessel is large.  Right Coronary Artery  Vessel is large.  Ost RCA to Prox RCA lesion 40% stenosed  Ost RCA to Prox RCA lesion is 40% stenosed.  Intervention   No interventions have been documented.  Coronary Diagrams   Diagnostic  Dominance: Right    Intervention   Implants    No implant documentation for this case.  Syngo Images   Show images for CARDIAC CATHETERIZATION  Images on Long Term Storage   Show images for Acasia, [place] to Procedure Log   Procedure Log    Hemo Data    Most Recent Value  Fick Cardiac Output 7.66 L/min  Fick Cardiac Output Index 3.7 (L/min)/BSA  Aortic Mean Gradient 28.71 mmHg  Aortic Peak Gradient 39 mmHg  Aortic Valve Area 1.32  Aortic Value Area Index 0.64 cm2/BSA  RA A Wave 12 mmHg  RA V Wave 9 mmHg  RA Mean 8 mmHg  RV Systolic Pressure 40 mmHg  RV Diastolic Pressure 4 mmHg  RV EDP 11 mmHg  PA Systolic Pressure 36 mmHg  PA Diastolic Pressure 10 mmHg  PA Mean 24 mmHg  PW A Wave 20 mmHg  PW V Wave 20 mmHg  PW Mean 15 mmHg  AO Systolic Pressure 814 mmHg  AO Diastolic Pressure 55 mmHg  AO Mean 76 mmHg  LV Systolic Pressure 481 mmHg  LV Diastolic Pressure 11 mmHg  LV EDP 19 mmHg  AOp Systolic Pressure 856 mmHg  AOp Diastolic Pressure 61 mmHg  AOp Mean Pressure 91 mmHg  LVp Systolic Pressure 314 mmHg  LVp Diastolic Pressure 13 mmHg  LVp EDP Pressure 20 mmHg  QP/QS 1  TPVR Index 6.47 HRUI  TSVR Index 20.5 HRUI  PVR SVR Ratio 0.13  TPVR/TSVR Ratio 0.32    ADDENDUM REPORT: 05/31/2018 16:07  EXAM: OVER-READ INTERPRETATION  CT CHEST  The following report is an over-read performed by radiologist Dr. Samara Snide [facility] on 05/31/2018. This over-read does not include  interpretation of cardiac or coronary anatomy or pathology. The cardiac CTA interpretation by the cardiologist is attached.  COMPARISON:  11/29/2016 CT angiogram of the chest.  FINDINGS: Please see the separate concurrent chest CT angiogram report for details.  IMPRESSION: Please see the separate concurrent chest CT angiogram report for details.   Electronically Signed   By: Ilona Sorrel M.D.   On: 05/31/2018 16:07   Addended by Sharyn Blitz, MD on 05/31/2018 4:09 PM    Study Result   CLINICAL DATA:  83 year old female with severe aortic stenosis being evaluated for a TAVR procedure.  EXAM: Cardiac TAVR CT  TECHNIQUE: The patient was scanned on a [employer]. A 120 kV retrospective scan was triggered in the descending thoracic aorta at 111 HU's. Gantry rotation speed was 250 msecs and collimation was .6 mm. No beta blockade or nitro were given. The 3D data set was reconstructed in 5% intervals of the R-R cycle. Systolic and diastolic phases were analyzed on a dedicated work station using MPR, MIP and VRT modes. The patient received 80 cc of contrast.  FINDINGS: Aortic Valve: Trileaflet aortic valve with severely thickened and calcified leaflets, severely restricted leaflet opening and minimal calcifications extending into the LVOT.  Aorta: Normal size with mild diffuse atherosclerotic plaque and calcifications and no dissection.  Sinotubular Junction: 23 x 22 mm  Ascending Thoracic Aorta: 28 x 28 mm  Aortic Arch: 25 x 23 mm  Descending Thoracic Aorta: 23 x 22 mm  Sinus of Valsalva Measurements:  Non-coronary: 25 mm  Right -coronary: 23 mm  Left -coronary: 25 mm  Coronary Artery Height above Annulus:  Left Main: 14 mm  Right Coronary: 12 mm  Virtual Basal Annulus Measurements:  Maximum/Minimum Diameter: 22.9 x 18.4 mm  Mean Diameter: 20.1 mm  Perimeter: 65.1 mm  Area: 317 mm2  Optimum  Fluoroscopic Angle for Delivery: RAO 8 RCA 9.  IMPRESSION: 1. Trileaflet aortic valve with severely thickened and calcified leaflets, severely restricted leaflet opening and minimal calcifications extending into the LVOT. Annular measurements suitable for delivery of a 23 mm Edwards-SAPIEN 3 valve (based on mean diameter).  2. Sufficient coronary to annulus distance.  3. Optimum Fluoroscopic Angle for Delivery:  RAO 8 RCA 9.  4. No thrombus in the left atrial appendage.  Electronically Signed: By: Ena Dawley On: 05/31/2018 15:44       CLINICAL DATA:  83 year old female with severe symptomatic aortic stenosis. Dyspnea on exertion. Pre-TAVR evaluation.  EXAM: CT ANGIOGRAPHY CHEST, ABDOMEN AND PELVIS  TECHNIQUE: Multidetector CT imaging through the chest, abdomen and pelvis was performed using the standard protocol during bolus administration of intravenous contrast. Multiplanar reconstructed images and MIPs were obtained and reviewed to evaluate the vascular anatomy.  CONTRAST:  177mL OMNIPAQUE IOHEXOL 350 MG/ML SOLN  COMPARISON:  11/29/2016 chest CT angiogram. 03/26/2013 CT abdomen/pelvis.  FINDINGS: CTA CHEST FINDINGS  Cardiovascular: Borderline mild cardiomegaly. No significant pericardial effusion/thickening. Coarsely calcified and prominently thickened aortic valve. Three-vessel coronary atherosclerosis. Atherosclerotic nonaneurysmal thoracic aorta. Normal caliber pulmonary arteries. No central pulmonary emboli.  Mediastinum/Nodes: No discrete thyroid nodules. Unremarkable esophagus. No pathologically enlarged axillary, mediastinal or hilar lymph nodes.  Lungs/Pleura: No pneumothorax. No pleural effusion. No acute consolidative airspace disease, lung masses or significant pulmonary nodules.  Musculoskeletal: No aggressive appearing focal osseous lesions. Moderate thoracic spondylosis.  CTA ABDOMEN AND PELVIS FINDINGS   Hepatobiliary: Normal liver with no liver mass. Cholecystectomy. No biliary ductal dilatation.  Pancreas: Normal, with no mass or duct dilation.  Spleen: Normal size. No mass.  Adrenals/Urinary Tract: Stable coarse calcification in the left adrenal gland compatible with remote insult. No discrete adrenal nodules. No hydronephrosis. No contour deforming renal masses. Normal bladder.  Stomach/Bowel: Normal non-distended stomach. Normal caliber small bowel with no small bowel wall thickening. Marked colonic diverticulosis, most prominent in the sigmoid colon, with no acute large bowel wall thickening or significant pericolonic fat stranding.  Vascular/Lymphatic: Atherosclerotic nonaneurysmal abdominal aorta. Patent portal, splenic and renal veins. No pathologically enlarged lymph nodes in the abdomen or pelvis.  Reproductive: Grossly normal uterus.  No adnexal mass.  Other: No pneumoperitoneum, ascites or focal fluid collection. Small fat containing periumbilical hernia, stable.  Musculoskeletal: No aggressive appearing focal osseous lesions. Marked lumbar spondylosis.  VASCULAR MEASUREMENTS PERTINENT TO TAVR:  AORTA:  Minimal Aortic Diameter-10.1 x 9.5 mm  Severity of Aortic Calcification-moderate to severe  RIGHT PELVIS:  Right Common Iliac Artery -  Minimal Diameter-8.0 x 7.9 mm  Tortuosity-mild  Calcification-mild  Right External Iliac Artery -  Minimal Diameter-5.8 x 5.7 mm  Tortuosity-mild  Calcification-none  Right Common Femoral Artery -  Minimal Diameter-6.7 x 5.9 mm  Tortuosity-mild  Calcification-none  LEFT PELVIS:  Left Common Iliac Artery -  Minimal Diameter-8.1 x 8.0 mm  Tortuosity-mild  Calcification-mild  Left External Iliac Artery -  Minimal Diameter-7.1 x 6.3 mm  Tortuosity-mild  Calcification-none  Left Common Femoral Artery -  Minimal Diameter-6.2 x 6.2 mm  Tortuosity-mild   Calcification-none  Review of the MIP images confirms the above findings.  IMPRESSION: 1. Vascular findings and measurements pertinent to potential TAVR procedure, as detailed. 2. Marked thickening and calcification of the aortic valve, compatible with the reported history of severe symptomatic aortic stenosis. 3. Three-vessel coronary atherosclerosis. 4. Marked colonic diverticulosis. 5.  Aortic Atherosclerosis (ICD10-I70.0).   Electronically Signed   By: Ilona Sorrel M.D.   On: 05/31/2018 18:01  [facility] Adult Cardiac Surgery Database Version 2.9 RISK SCORES Procedure: Isolated AVR CALCULATE   Risk of Mortality: 2.867%  Renal Failure: 3.689%  Permanent Stroke: 0.906%  Prolonged Ventilation: 11.051%  DSW Infection: 0.264%  Reoperation: 2.544%  Morbidity or Mortality: 15.273%  Short Length of Stay: 23.363%  Long Length of Stay: 8.536%   Impression:  This 83 year old morbidly obese woman has stage D, severe, symptomatic aortic stenosis with NYHA class II symptoms of exertional fatigue and shortness of breath consistent with chronic diastolic congestive heart failure. I have personally reviewed her echo, cath and CTA studies. Echo shows a calcified aortic valve with poor leaflet mobility. It is not possible to tell how many leaflets there are on this echo but her echo in October 2019 showed three leaflets that were severely thickened and calcified with restricted mobility. Her cardiac cath shows non-obstructive CAD. I agree that AVR is indicated in this 83 year old woman who is still active to improve her symptoms and quality of life and to prevent progressive left ventricular deterioration and congestive heart failure. I think TAVR is the best option for her due to her age and comorbid risk factors. Her gated cardiac CTA shows a relatively small area of 317 mm2 which would only allow insertion of a 20 mm Sapien 3 valve. I think this would be too small for her BSA of 2.2 m2. Her  annulus would allow insertion of a 23 mm Medtronic Evolut Pro valve which will minimize any patient-prosthesis mismatch. Her abdominal and pelvic CTA shows anatomy suitable for transfemoral insertion.   The patient was counseled at length regarding treatment alternatives for management of severe symptomatic aortic stenosis. The risks and benefits of surgical intervention has been discussed in detail. Long-term prognosis with medical therapy was discussed. Alternative approaches such as conventional surgical aortic valve replacement, transcatheter aortic valve replacement, and palliative medical therapy were compared and contrasted at length. This discussion was placed in the context of the patient's own specific clinical presentation and past medical history. All of her questions have been addressed.   Following the decision to proceed with transcatheter aortic valve replacement, a discussion was held regarding what types of management strategies would be attempted intraoperatively in the event of life-threatening complications, including whether or not the patient would be considered a candidate for the use of cardiopulmonary bypass and/or conversion to open sternotomy for attempted surgical intervention. The patient is aware of the fact that transient use of cardiopulmonary bypass may be necessary. She is 83 years old but still in reasonable health and I think she would be a candidate for sternotomy if needed to manage any intraoperative complications.  The patient has been advised of a variety of complications that might develop including but not limited to risks of death, stroke, paravalvular leak, aortic dissection or other major vascular complications, aortic annulus rupture, device embolization, cardiac rupture or perforation, mitral regurgitation, acute myocardial infarction, arrhythmia, heart block or bradycardia requiring permanent pacemaker placement, congestive heart failure, respiratory failure,  renal failure, pneumonia, infection, other late complications related to structural valve deterioration or migration, or other complications that might ultimately cause a temporary or permanent loss of functional independence or other long term morbidity. The patient provides full informed consent for the procedure as described and all questions were answered.    Plan:  Transfemoral TAVR using a 23 mm Medtronic Evolut Pro Valve on Tuesday 06/06/2018.   I spent 60 minutes performing this consultation and > 50% of this time was spent face to face counseling and coordinating the care of this patient's severe symptomatic aortic stenosis.    Gaye Pollack, MD 06/02/2018 1:44 PM

## 2018-06-02 NOTE — Progress Notes (Signed)
  Coronavirus Screening  Have you experienced the following symptoms:  Cough yes/no: No Fever (>100.70F)  yes/no: No Runny nose yes/no: No Sore throat yes/no: No Difficulty breathing/shortness of breath  yes/no: No  Have you or a family member traveled in the last 14 days and where? yes/no: No Will get tested for COVID today at [facility].  If the patient indicates "YES" to the above questions, their PAT will be rescheduled to limit the exposure to others and, the surgeon will be notified. THE PATIENT WILL NEED TO BE ASYMPTOMATIC FOR 14 DAYS.   If the patient is not experiencing any of these symptoms, the PAT nurse will instruct them to NOT bring anyone with them to their appointment since they may have these symptoms or traveled as well.   Please remind your patients and families that hospital visitation restrictions are in effect and the importance of the restrictions.

## 2018-06-02 NOTE — Progress Notes (Signed)
PCP - [name],Wanda  Cardiologist - [place]  Chest x-ray - 06-02-18  EKG - 06-02-18  Stress Test - 12-02-16  ECHO - 03-24-18  Cardiac Cath - 04-06-18  AICD-denies PM-denies LOOP-denies  Sleep Study - denies CPAP - NA  LABS-CBC,CMP,BNP,T/S,A1C,ABG,UA,PT,APTT  ASA-Last day 05/18    ERAS-NA  HA1C-06-02-18 Fasting Blood Sugar -  Checks Blood Sugar __0___ times a day  Anesthesia-Y. Extensive cardiac history  Pt denies having chest pain, sob, or fever at this time. All instructions explained to the pt, with a verbal understanding of the material. Pt agrees to go over the instructions while at home for a better understanding. The opportunity to ask questions was provided.

## 2018-06-03 LAB — NOVEL CORONAVIRUS, NAA (HOSP ORDER, SEND-OUT TO REF LAB; TAT 18-24 HRS): SARS-CoV-2, NAA: NOT DETECTED

## 2018-06-05 ENCOUNTER — Telehealth: Payer: Self-pay

## 2018-06-05 MED ORDER — VANCOMYCIN HCL 10 G IV SOLR
1500.0000 mg | INTRAVENOUS | Status: AC
  Administered 2018-06-06: 16:00:00 1500 mg via INTRAVENOUS
  Filled 2018-06-05: qty 1500

## 2018-06-05 MED ORDER — NOREPINEPHRINE BITARTRATE 1 MG/ML IV SOLN
0.0000 ug/min | INTRAVENOUS | Status: AC
  Administered 2018-06-06: 2 ug/min via INTRAVENOUS
  Filled 2018-06-05: qty 4

## 2018-06-05 MED ORDER — POTASSIUM CHLORIDE 2 MEQ/ML IV SOLN
80.0000 meq | INTRAVENOUS | Status: DC
  Filled 2018-06-05: qty 40

## 2018-06-05 MED ORDER — SODIUM CHLORIDE 0.9 % IV SOLN
INTRAVENOUS | Status: DC
  Filled 2018-06-05: qty 30

## 2018-06-05 MED ORDER — MAGNESIUM SULFATE 50 % IJ SOLN
40.0000 meq | INTRAMUSCULAR | Status: DC
  Filled 2018-06-05: qty 9.85

## 2018-06-05 MED ORDER — DEXMEDETOMIDINE HCL IN NACL 400 MCG/100ML IV SOLN
0.1000 ug/kg/h | INTRAVENOUS | Status: AC
  Administered 2018-06-06: 0.7 ug/kg/h via INTRAVENOUS
  Filled 2018-06-05: qty 100

## 2018-06-05 MED ORDER — SODIUM CHLORIDE 0.9 % IV SOLN
1.5000 g | INTRAVENOUS | Status: AC
  Administered 2018-06-06: 1.5 g via INTRAVENOUS
  Filled 2018-06-05: qty 1.5

## 2018-06-05 NOTE — Telephone Encounter (Signed)
Late Entry:  I spoke with the pt today at 4:30 PM due to [facility] being incomplete from 06/02/2018.  This form is scanned under the media tab.  #6- extremely limited my enjoyment of life #7- Mostly disatified #8 A-Severely limited, B-Severely limited, C-Does not apply or did not do for other reasons

## 2018-06-05 NOTE — H&P (Signed)
[address]       [place] [address]             [phone]      Cardiothoracic Surgery Admission History and Physical   Referring Provider is [name], Denice Bors, MD Primary Cardiologist is [name]. PCP is [name], [name], MD      Chief Complaint  Patient presents with   Severe aortic stenosis   HPI:  The patient is an 83 year old woman with a history of HTN, hyperlipidemia and moderate aortic stenosis that has been followed by [name] with serial echocardiograms. She now has progressively worsening shortness of breath and fatigue as well as lower extremity swelling. Her most recent echo on 03/24/2018 showed an increase in the mean AV gradient to 46 mm Hg with a peak of 70.2 mm Hg and an AVA of 0.64 cm2. LVEF was normal. Cardiac cath showed mild non-obstructive CAD. The mean gradient was 28.7 with a peak of 39.   She is married and lives at home with her husband.      Past Medical History:  Diagnosis Date   Aortic stenosis    Aortic valve disorder 06/18/2008   Qualifier: Diagnosis of  By: [name], MD, Kandyce Rud    CAD (coronary artery disease) 03/19/2014   Chest pain 06/17/2011   Colon polyp    Coronary artery disease    aortic stenosis/mild per ECHO   Dyspnea    Essential hypertension 07/21/2006   Qualifier: Diagnosis of  By: Hulan Saas, CMA (AAMA), Quita Skye    FATIGUE 12/31/2009   Qualifier: Diagnosis of  By: Regis Bill MD, [name]    Generalized osteoarthrosis, involving multiple sites    GERD (gastroesophageal reflux disease)    dilitation   HEART MURMUR, SYSTOLIC 8/46/9629   Qualifier: Diagnosis of  By: Regis Bill MD, [name]    Hx of colonoscopy with polypectomy 07/14/2012   medoff    Hyperlipidemia    HYPERLIPIDEMIA 07/21/2006   Qualifier: Diagnosis of  By: Hulan Saas, CMA (AAMA), Larene Beach S    Hypertension    Impaired fasting glucose    Obesity    Personal history of  other diseases of digestive system    PONV (postoperative nausea and vomiting)    SOB 07/21/2006   PFT's 2005   C/w classic obesity effects with disporportionate ERV reduction           Past Surgical History:  Procedure Laterality Date   APPENDECTOMY     CHOLECYSTECTOMY     FOOT SURGERY     rt   KNEE SURGERY     rt   LEFT AND RIGHT HEART CATHETERIZATION WITH CORONARY ANGIOGRAM N/A 10/10/2013   Procedure: LEFT AND RIGHT HEART CATHETERIZATION WITH CORONARY ANGIOGRAM;  Surgeon: Burnell Blanks, MD;  Location: [facility] CATH LAB;  Service: Cardiovascular;  Laterality: N/A;   RIGHT/LEFT HEART CATH AND CORONARY ANGIOGRAPHY N/A 04/06/2018   Procedure: RIGHT/LEFT HEART CATH AND CORONARY ANGIOGRAPHY;  Surgeon: Burnell Blanks, MD;  Location: [facility] CV LAB;  Service: Cardiovascular;  Laterality: N/A;   TONSILLECTOMY     TOTAL KNEE ARTHROPLASTY  02/01/2011   Procedure: TOTAL KNEE ARTHROPLASTY;  Surgeon: Kerin Salen;  Location: [facility];  Service: Orthopedics;  Laterality: Right;  DEPUY/SIGMA         Family History  Problem Relation Age of Onset   Coronary artery disease Other  female- 1st degree relative   Coronary artery disease Other        female- 1st degree relative   Hypertension Other    Hyperlipidemia Other    Heart attack Mother        age 45's   Heart attack Father        age 62's   Stroke Brother        died    Hearing loss Son    Sleep apnea Son    Aortic stenosis Sister    Stroke Brother    Heart attack Brother    Rheum arthritis Sister    Osteoarthritis Sister    Arthritis Sister    Heart Problems Brother     Social History        Socioeconomic History   Marital status: Married    Spouse name: Not on file   Number of children: 4   Years of education: Not on file   Highest education level: Not on file  Occupational History   Occupation: Retired    Comment: Takes  care of Grandchildren occ   Occupation: Therapist, sports, peds, cardiology    Comment: retired  Scientist, product/process development strain: Not hard at all   Food insecurity:    Worry: Never true    Inability: Never true   Transportation needs:    Medical: No    Non-medical: No  Tobacco Use   Smoking status: Never Smoker   Smokeless tobacco: Never Used  Substance and Sexual Activity   Alcohol use: No   Drug use: No   Sexual activity: Not Currently    Comment: quit 40 yrs ago  Lifestyle   Physical activity:    Days per week: 0 days    Minutes per session: 0 min   Stress: Only a little  Relationships   Social connections:    Talks on phone: More than three times a week    Gets together: Once a week    Attends religious service: More than 4 times per year    Active member of club or organization: Yes    Attends meetings of clubs or organizations: More than 4 times per year    Relationship status: Married   Intimate partner violence:    Fear of current or ex partner: No    Emotionally abused: No    Physically abused: No    Forced sexual activity: No  Other Topics Concern   Not on file  Social History Narrative   hh of 3 -4  Married   [place] son   At home    Invalid son paraplegia and husband    And grandson recently     No pets   Fluctuating hh membors she cooks and prepares for them                 Current Outpatient Medications  Medication Sig Dispense Refill   aspirin 81 MG tablet Take 81 mg by mouth daily.     calcium carbonate (TUMS - DOSED IN MG ELEMENTAL CALCIUM) 500 MG chewable tablet Chew 2 tablets by mouth daily as needed for indigestion or heartburn.     cholecalciferol (VITAMIN D) 1000 UNITS tablet Take 1,000 Units by mouth daily.       hydrochlorothiazide (MICROZIDE) 12.5 MG capsule Take 12.5 mg by mouth daily.     ibuprofen (ADVIL) 200 MG tablet Take 400 mg by mouth every 6 (six) hours as needed  for headache or moderate  pain.     losartan (COZAAR) 25 MG tablet Take 50 mg by mouth daily.     Menthol, Topical Analgesic, (BENGAY EX) Apply 1 application topically daily as needed (back pain).     metoprolol succinate (TOPROL-XL) 50 MG 24 hr tablet TAKE 1 TABLET BY MOUTH  DAILY (Patient taking differently: Take 50 mg by mouth daily. ) 90 tablet 1   Misc Natural Products (TART CHERRY ADVANCED PO) Take 1 capsule by mouth daily.      Multiple Vitamin (MULTIVITAMINS PO) Take 1 tablet by mouth daily.      Omega-3 1000 MG CAPS Take 1,000 mg by mouth every morning.      omeprazole-sodium bicarbonate (ZEGERID) 40-1100 MG per capsule Take 1 capsule by mouth 2 (two) times a week. Tuesdays and Thursdays     Polyethyl Glycol-Propyl Glycol (SYSTANE) 0.4-0.3 % GEL ophthalmic gel Place 1 application into both eyes 2 (two) times daily.     Respiratory Therapy Supplies (NEBULIZER) DEVI by Does not apply route.     rosuvastatin (CRESTOR) 40 MG tablet Take 1 tablet (40 mg total) by mouth every other day. (Patient taking differently: Take 20 mg by mouth every other day. ) 90 tablet 1   No current facility-administered medications for this visit.          Allergies  Allergen Reactions   Lisinopril Cough   Statins     muscle aches with some statins, tolerates low doses of crestor      Review of Systems:              General:                      normal appetite, decreased energy, no weight gain, no weight loss, no fever             Cardiac:                       no chest pain with exertion, no chest pain at rest, +SOB with mild exertion, + resting SOB, no PND, + orthopnea, no palpitations, no arrhythmia, no atrial fibrillation, + LE edema, no dizzy spells, no syncope             Respiratory:                 + shortness of breath, no home oxygen, no productive cough, no dry cough, no bronchitis, no wheezing, no hemoptysis, no asthma, no pain with inspiration or  cough, no sleep apnea, no CPAP at night             GI:                               no difficulty swallowing, no reflux, no frequent heartburn, no hiatal hernia, no abdominal pain, no constipation, no diarrhea, no hematochezia, no hematemesis, no melena             GU:                              no dysuria,  no frequency, no urinary tract infection, no hematuria,  no kidney stones, no kidney disease             Vascular:  no pain suggestive of claudication, no pain in feet, no leg cramps, no varicose veins, no DVT, no non-healing foot ulcer             Neuro:                         no stroke, no TIA's, no seizures, no headaches, no temporary blindness one eye,  no slurred speech, no peripheral neuropathy, no chronic pain, no instability of gait, no memory/cognitive dysfunction             Musculoskeletal:         + arthritis, + joint swelling, no myalgias, + difficulty walking, reduced mobility              Skin:                            no rash, no itching, no skin infections, no pressure sores or ulcerations             Psych:                         no anxiety, no depression, no nervousness, no unusual recent stress             Eyes:                           no blurry vision, no floaters, no recent vision changes, wears glasses              ENT:                            + hearing loss, no loose or painful teeth, no dentures, last saw dentist October 2019             Hematologic:               + easy bruising, no abnormal bleeding, no clotting disorder, no frequent epistaxis             Endocrine:                   no diabetes, does not check CBG's at home                            Physical Exam:              BP 130/68    Pulse 72    Temp 97.7 F (36.5 C) (Skin)    Resp 20    Ht 5\' 2"  (1.575 m)    Wt 245 lb (111.1 kg)    SpO2 97% Comment: RA   BMI 44.81 kg/m              General:                      Obese,  well-appearing             HEENT:                        Unremarkable, NCAT, PERLA, EOMI             Neck:  no JVD, no bruits, no adenopathy or thyromegaly             Chest:                          clear to auscultation, symmetrical breath sounds, no wheezes, no rhonchi              CV:                              RRR, grade III/VI crescendo/decrescendo murmur heard best at RSB,  no diastolic murmur             Abdomen:                    soft, non-tender, no masses              Extremities:                 warm, well-perfused, pulses not palpable, mild bilateral  LE edema             Rectal/GU                   Deferred             Neuro:                         Grossly non-focal and symmetrical throughout             Skin:                            Clean and dry, no rashes, no breakdown   Diagnostic Tests:  Patient Name: Anna Gamble Date of Exam: 03/24/2018 Medical Rec #: [id number] Height: 62.0 in Accession #: [id number] Weight: 247.0 lb Date of Birth: 1935-11-23 BSA: 2.09 m Patient Age: 42 years BP: 126/80 mmHg Patient Gender: F HR: 68 bpm. Exam Location: [address]   Procedure: 2D Echo, Cardiac Doppler, Color Doppler and Strain Analysis  Indications: I35.0 Aortic valve disease.  History: Patient has prior history of Echocardiogram examinations, most recent 10/27/2017. CAD, Aortic Valve Disease; Signs/Symptoms: Murmur and Dyspnea; Risk Factors: Dyslipidemia, Hypertension and Morbid obesity.  Sonographer: Lenard Galloway BA, RDCS Referring Phys: Allen   1. The left ventricle has hyperdynamic systolic function, with an ejection fraction of >65%. The cavity size was normal. There is mildly increased left ventricular wall thickness. Left ventricular diastolic Doppler parameters are consistent with  impaired  relaxation. 2. The right ventricle has normal systolic function. The cavity was normal. There is no increase in right ventricular wall thickness. 3. The mitral valve is normal in structure. 4. The tricuspid valve is normal in structure. 5. The aortic valve was not well visualized. 6. AV is difficult to see well Peak and mean gradients through the valve are 68 and 43 mm Hg respectively consistent with severe AS Since previous echo from October 2019, these gradients are increased.  FINDINGS Left Ventricle: The left ventricle has hyperdynamic systolic function, with an ejection fraction of >65%. The cavity size was normal. There is mildly increased left ventricular wall thickness. Left ventricular diastolic Doppler parameters are consistent with impaired relaxation Right Ventricle: The right ventricle has normal systolic function. The cavity was normal. There is no increase in right ventricular  wall thickness. Left Atrium: left atrial size was normal in size Right Atrium: right atrial size was normal in size. Right atrial pressure is estimated at 3 mmHg. Interatrial Septum: No atrial level shunt detected by color flow Doppler. Pericardium: There is no evidence of pericardial effusion. Mitral Valve: The mitral valve is normal in structure. Mitral valve regurgitation is trivial by color flow Doppler. Tricuspid Valve: The tricuspid valve is normal in structure. Tricuspid valve regurgitation is trivial by color flow Doppler. Aortic Valve: The aortic valve was not well visualized Aortic valve regurgitation was not visualized by color flow Doppler. Pulmonic Valve: The pulmonic valve was grossly normal. Pulmonic valve regurgitation is not visualized by color flow Doppler. Additional Comments: AV is difficult to see well Peak and mean gradients through the valve are 68 and 43 mm Hg respectively consistent with severe AS Since previous echo from October 2019, these gradients are increased. Compared  to previous exam: LVEF 65%. Moderate aortic stenosis mean gradient of 32 mm Hg and max gradient 56 mmHg.  LEFT VENTRICLE PLAX 2D (Teich) LV EF: 65.9 % Diastology LVIDd: 4.15 cm LV e' lateral: 9.57 cm/s LVIDs: 2.66 cm LV E/e' lateral: 10.6 LV PW: 1.01 cm LV e' medial: 6.64 cm/s LV IVS: 1.45 cm LV E/e' medial: 15.2 LVOT diam: 2.00 cm LV SV: 50 ml LVOT Area: 3.14 cm  RIGHT VENTRICLE RV Basal diam: 3.17 cm RV S prime: 10.20 cm/s TAPSE (M-mode): 2.0 cm  LEFT ATRIUM Index RIGHT ATRIUM LA diam: 4.40 cm 2.10 cm/m RA Pressure: 3 mmHg LA Vol (A2C): 48.5 ml 23.19 ml/m LA Vol (A4C): 48.6 ml 23.24 ml/m LA Biplane Vol: 49.0 ml 23.43 ml/m AORTIC VALVE AV Area (Vmax): 0.66 cm AV Area (Vmean): 0.64 cm AV Area (VTI): 0.65 cm AV Vmax: 419.00 cm/s AV Vmean: 303.200 cm/s AV VTI: 1.160 m AV Peak Grad: 70.2 mmHg AV Mean Grad: 46.0 mmHg LVOT Vmax: 87.90 cm/s LVOT Vmean: 61.700 cm/s LVOT VTI: 0.241 m LVOT/AV VTI ratio: 0.21  AORTA Ao Root diam: 2.40 cm Ao Asc diam: 2.80 cm  MITRAL VALVE  MV Decel Time: 278 msec MV E velocity: 101.00 cm/s MV A velocity: 95.60 cm/s MV E/A ratio: 1.06   Dorris Carnes MD Electronically signed by Dorris Carnes MD Signature Date/Time: 03/24/2018/6:08:22 PM   Physicians   Panel Physicians Referring Physician Case Authorizing Physician  Burnell Blanks, MD (Primary)    Procedures   RIGHT/LEFT HEART CATH AND CORONARY ANGIOGRAPHY  Conclusion     Ost RCA to Prox RCA lesion is 40% stenosed.  Ost LM to Mid LM lesion is 20% stenosed.  1. Mild non-obstructive CAD 2. Severe aortic stenosis (mean gradient 28.7 mmHg, peak to peak gradient 39 mmHg)  Recommendations: Will continue workup for TAVR.    Recommendations   Antiplatelet/Anticoag  Continue workup for TAVR  Indications   Severe aortic stenosis [I35.0 (ICD-10-CM)]  Procedural Details   Technical Details Indication: Severe aortic stenosis.  Procedure: The risks, benefits, complications, treatment options, and expected outcomes were discussed with the patient. The patient and/or family concurred with the proposed plan, giving informed consent. The patient was brought to the cath lab after IV hydration was given. The patient was sedated with Versed and Fentanyl. The IV catheter present in the right antecubital vein was changed for a 5 [place] sheath using sterile prep. Right heart catheterization performed with a balloon tipped catheter. The right wrist was prepped and draped in a sterile fashion. 1% lidocaine was used for local anesthesia. Using  the modified Seldinger access technique, a 5 French sheath was placed in the right radial artery. 3 mg Verapamil was given through the sheath. 5000 units IV heparin was given. Standard diagnostic catheters were used to perform selective coronary angiography. I crossed the aortic valve with an AL-1 and a straight wire. The sheath was removed from the right radial artery and a Terumo hemostasis band was applied at the arteriotomy site on the right wrist.    Estimated blood loss <50 mL.   During this procedure medications were administered to achieve and maintain moderate conscious sedation while the patient's heart rate, blood pressure, and oxygen saturation were continuously monitored and I was present face-to-face 100% of this time.  Medications  (Filter: Administrations occurring from 04/06/18 0901 to 04/06/18 1002)          Medication Rate/Dose/Volume Action  Date Time   fentaNYL (SUBLIMAZE) injection (mcg) 25 mcg Given 04/06/18 0913   Total dose as of 06/04/18 0920        25 mcg        midazolam (VERSED) injection (mg) 1 mg Given 04/06/18 0913   Total dose as of 06/04/18 0920        1 mg          lidocaine (PF) (XYLOCAINE) 1 % injection (mL) 2 mL Given 04/06/18 0924   Total dose as of 06/04/18 0920 2 mL Given 0928   4 mL        Radial Cocktail/Verapamil only (mL) 10 mL Given 04/06/18 0928   Total dose as of 06/04/18 0920        10 mL        heparin injection (Units) 5,000 Units Given 04/06/18 0937   Total dose as of 06/04/18 0920        5,000 Units        Heparin (Porcine) in NaCl 1000-0.9 UT/500ML-% SOLN (mL) 500 mL Given 04/06/18 0958   Total dose as of 06/04/18 0920 500 mL Given 0958   1,000 mL        iohexol (OMNIPAQUE) 350 MG/ML injection (mL) 60 mL Given 04/06/18 0958   Total dose as of 06/04/18 0920        60 mL        Sedation Time   Sedation Time Physician-1: 35 minutes 37 seconds  Complications   Complications documented before study signed (04/06/2018 10:11 AM EDT)    RIGHT/LEFT HEART CATH AND CORONARY ANGIOGRAPHY   None Documented by Burnell Blanks, MD 04/06/2018 9:57 AM EDT  Time Range: Intraprocedure      Coronary Findings   Diagnostic  Dominance: Right  Left Main  Ost LM to Mid LM lesion 20% stenosed  Ost LM to Mid LM lesion is 20% stenosed.  Left Anterior Descending  Vessel is moderate in size.  Left Circumflex  Vessel is large.  Right Coronary Artery  Vessel is large.  Ost RCA to Prox RCA lesion 40% stenosed  Ost RCA to Prox RCA lesion is 40% stenosed.  Intervention   No interventions have been documented.  Coronary Diagrams   Diagnostic  Dominance: Right    Intervention   Implants       No implant documentation for this case.  Syngo Images   Show images for CARDIAC CATHETERIZATION  Images on Long Term Storage   Show images for Den[place], [place] to Procedure Log   Procedure Log    Hemo Data    Most Recent Value  Fick Cardiac Output 7.66 L/min  Fick Cardiac Output Index 3.7 (L/min)/BSA  Aortic Mean Gradient 28.71 mmHg  Aortic Peak  Gradient 39 mmHg  Aortic Valve Area 1.32  Aortic Value Area Index 0.64 cm2/BSA  RA A Wave 12 mmHg  RA V Wave 9 mmHg  RA Mean 8 mmHg  RV Systolic Pressure 40 mmHg  RV Diastolic Pressure 4 mmHg  RV EDP 11 mmHg  PA Systolic Pressure 36 mmHg  PA Diastolic Pressure 10 mmHg  PA Mean 24 mmHg  PW A Wave 20 mmHg  PW V Wave 20 mmHg  PW Mean 15 mmHg  AO Systolic Pressure 528 mmHg  AO Diastolic Pressure 55 mmHg  AO Mean 76 mmHg  LV Systolic Pressure 413 mmHg  LV Diastolic Pressure 11 mmHg  LV EDP 19 mmHg  AOp Systolic Pressure 244 mmHg  AOp Diastolic Pressure 61 mmHg  AOp Mean Pressure 91 mmHg  LVp Systolic Pressure 010 mmHg  LVp Diastolic Pressure 13 mmHg  LVp EDP Pressure 20 mmHg  QP/QS 1  TPVR Index 6.47 HRUI  TSVR Index 20.5 HRUI  PVR SVR Ratio 0.13  TPVR/TSVR Ratio 0.32    ADDENDUM REPORT: 05/31/2018 16:07  EXAM: OVER-READ INTERPRETATION CT CHEST  The following report is an over-read performed by radiologist Dr. Samara Snide [facility] on 05/31/2018. This over-read does not include interpretation of cardiac or coronary anatomy or pathology. The cardiac CTA interpretation by the cardiologist is attached.  COMPARISON: 11/29/2016 CT angiogram of the chest.  FINDINGS: Please see the separate concurrent chest CT angiogram report for details.  IMPRESSION: Please see the separate concurrent chest CT angiogram report for details.   Electronically Signed By: Ilona Sorrel M.D. On: 05/31/2018 16:07   Addended by Sharyn Blitz, MD on 05/31/2018 4:09 PM    Study Result   CLINICAL DATA: 83 year old female with severe aortic stenosis being evaluated for a TAVR procedure.  EXAM: Cardiac TAVR CT  TECHNIQUE: The patient was scanned on a [employer]. A 120 kV retrospective scan was triggered in the descending thoracic aorta at 111 HU's. Gantry rotation speed was 250 msecs and collimation was .6 mm. No beta blockade or  nitro were given. The 3D data set was reconstructed in 5% intervals of the R-R cycle. Systolic and diastolic phases were analyzed on a dedicated work station using MPR, MIP and VRT modes. The patient received 80 cc of contrast.  FINDINGS: Aortic Valve: Trileaflet aortic valve with severely thickened and calcified leaflets, severely restricted leaflet opening and minimal calcifications extending into the LVOT.  Aorta: Normal size with mild diffuse atherosclerotic plaque and calcifications and no dissection.  Sinotubular Junction: 23 x 22 mm  Ascending Thoracic Aorta: 28 x 28 mm  Aortic Arch: 25 x 23 mm  Descending Thoracic Aorta: 23 x 22 mm  Sinus of Valsalva Measurements:  Non-coronary: 25 mm  Right -coronary: 23 mm  Left -coronary: 25 mm  Coronary Artery Height above Annulus:  Left Main: 14 mm  Right Coronary: 12 mm  Virtual Basal Annulus Measurements:  Maximum/Minimum Diameter: 22.9 x 18.4 mm  Mean Diameter: 20.1 mm  Perimeter: 65.1 mm  Area: 317 mm2  Optimum Fluoroscopic Angle for Delivery: RAO 8 RCA 9.  IMPRESSION: 1. Trileaflet aortic valve with severely thickened and calcified leaflets, severely restricted leaflet opening and minimal calcifications extending into the LVOT. Annular measurements suitable for delivery of a 23 mm Edwards-SAPIEN 3 valve (based on mean diameter).  2. Sufficient coronary to annulus distance.  3. Optimum Fluoroscopic Angle for Delivery: RAO 8 RCA 9.  4. No thrombus in the left atrial appendage.  Electronically Signed: By: Ena Dawley On: 05/31/2018 15:44       CLINICAL DATA: 83 year old female with severe symptomatic aortic stenosis. Dyspnea on exertion. Pre-TAVR evaluation.  EXAM: CT ANGIOGRAPHY CHEST, ABDOMEN AND PELVIS  TECHNIQUE: Multidetector CT imaging through the chest, abdomen and pelvis was performed using the standard protocol during bolus administration  of intravenous contrast. Multiplanar reconstructed images and MIPs were obtained and reviewed to evaluate the vascular anatomy.  CONTRAST: 171mL OMNIPAQUE IOHEXOL 350 MG/ML SOLN  COMPARISON: 11/29/2016 chest CT angiogram. 03/26/2013 CT abdomen/pelvis.  FINDINGS: CTA CHEST FINDINGS  Cardiovascular: Borderline mild cardiomegaly. No significant pericardial effusion/thickening. Coarsely calcified and prominently thickened aortic valve. Three-vessel coronary atherosclerosis. Atherosclerotic nonaneurysmal thoracic aorta. Normal caliber pulmonary arteries. No central pulmonary emboli.  Mediastinum/Nodes: No discrete thyroid nodules. Unremarkable esophagus. No pathologically enlarged axillary, mediastinal or hilar lymph nodes.  Lungs/Pleura: No pneumothorax. No pleural effusion. No acute consolidative airspace disease, lung masses or significant pulmonary nodules.  Musculoskeletal: No aggressive appearing focal osseous lesions. Moderate thoracic spondylosis.  CTA ABDOMEN AND PELVIS FINDINGS  Hepatobiliary: Normal liver with no liver mass. Cholecystectomy. No biliary ductal dilatation.  Pancreas: Normal, with no mass or duct dilation.  Spleen: Normal size. No mass.  Adrenals/Urinary Tract: Stable coarse calcification in the left adrenal gland compatible with remote insult. No discrete adrenal nodules. No hydronephrosis. No contour deforming renal masses. Normal bladder.  Stomach/Bowel: Normal non-distended stomach. Normal caliber small bowel with no small bowel wall thickening. Marked colonic diverticulosis, most prominent in the sigmoid colon, with no acute large bowel wall thickening or significant pericolonic fat stranding.  Vascular/Lymphatic: Atherosclerotic nonaneurysmal abdominal aorta. Patent portal, splenic and renal veins. No pathologically enlarged lymph nodes in the abdomen or pelvis.  Reproductive: Grossly normal uterus. No adnexal  mass.  Other: No pneumoperitoneum, ascites or focal fluid collection. Small fat containing periumbilical hernia, stable.  Musculoskeletal: No aggressive appearing focal osseous lesions. Marked lumbar spondylosis.  VASCULAR MEASUREMENTS PERTINENT TO TAVR:  AORTA:  Minimal Aortic Diameter-10.1 x 9.5 mm  Severity of Aortic Calcification-moderate to severe  RIGHT PELVIS:  Right Common Iliac Artery -  Minimal Diameter-8.0 x 7.9 mm  Tortuosity-mild  Calcification-mild  Right External Iliac Artery -  Minimal Diameter-5.8 x 5.7 mm  Tortuosity-mild  Calcification-none  Right Common Femoral Artery -  Minimal Diameter-6.7 x 5.9 mm  Tortuosity-mild  Calcification-none  LEFT PELVIS:  Left Common Iliac Artery -  Minimal Diameter-8.1 x 8.0 mm  Tortuosity-mild  Calcification-mild  Left External Iliac Artery -  Minimal Diameter-7.1 x 6.3 mm  Tortuosity-mild  Calcification-none  Left Common Femoral Artery -  Minimal Diameter-6.2 x 6.2 mm  Tortuosity-mild  Calcification-none  Review of the MIP images confirms the above findings.  IMPRESSION: 1. Vascular findings and measurements pertinent to potential TAVR procedure, as detailed. 2. Marked thickening and calcification of the aortic valve, compatible with the reported history of severe symptomatic aortic stenosis. 3. Three-vessel coronary atherosclerosis. 4. Marked colonic diverticulosis. 5. Aortic Atherosclerosis (ICD10-I70.0).   Electronically Signed By: Ilona Sorrel M.D. On: 05/31/2018 18:01  [facility] Adult Cardiac Surgery Database Version 2.9 RISK SCORES Procedure: Isolated AVR CALCULATE   Risk of Mortality: 2.867%  Renal Failure: 3.689%  Permanent Stroke: 0.906%  Prolonged Ventilation: 11.051%  DSW Infection: 0.264%  Reoperation: 2.544%  Morbidity or Mortality: 15.273%  Short Length of Stay: 23.363%  Long Length of Stay: 8.536%    Impression:  This 83 year old morbidly obese woman has stage D, severe, symptomatic aortic stenosis with NYHA class II symptoms of exertional fatigue and shortness of breath consistent with chronic diastolic congestive heart failure. I have personally reviewed her echo, cath and CTA studies. Echo shows a calcified aortic valve with poor leaflet mobility. It is not possible to tell how many leaflets there are on this echo but her echo in October 2019 showed three leaflets that were severely thickened and calcified with restricted mobility. Her cardiac cath shows non-obstructive CAD. I agree that AVR is indicated in this 83 year old woman who is still active to improve her symptoms and quality of life and to prevent progressive left ventricular deterioration and congestive heart failure. I think TAVR is the best option for her due to her age and comorbid risk factors. Her gated cardiac CTA shows a relatively small area of 317 mm2 which would only allow insertion of a 20 mm Sapien 3 valve. I think this would be too small for her BSA of 2.2 m2. Her annulus would allow insertion of a 23 mm Medtronic Evolut Pro valve which will minimize any patient-prosthesis mismatch. Her abdominal and pelvic CTA shows anatomy suitable for transfemoral insertion.   The patient was counseled at length regarding treatment alternatives for management of severe symptomatic aortic stenosis. The risks and benefits of surgical intervention has been discussed in detail. Long-term prognosis with medical therapy was discussed. Alternative approaches such as conventional surgical aortic valve replacement, transcatheter aortic valve replacement, and palliative medical therapy were compared and contrasted at length. This discussion was placed in the context of the patient's own specific clinical presentation and past medical history. All of her questions have been addressed.   Following the decision to proceed with transcatheter aortic  valve replacement, a discussion was held regarding what types of management strategies would be attempted intraoperatively in the event of life-threatening complications, including whether or not the patient would be considered a candidate for the use of cardiopulmonary bypass and/or conversion to open sternotomy for attempted surgical intervention. The patient is aware of the fact that transient use of cardiopulmonary bypass may be necessary. She is 83 years old but still in reasonable health and I think she would be a candidate for sternotomy if needed to manage any intraoperative complications.  The patient has been advised of a variety of complications that might develop including but not limited to risks of death, stroke, paravalvular leak, aortic dissection or other major vascular complications, aortic annulus rupture, device embolization, cardiac rupture or perforation, mitral regurgitation, acute myocardial infarction, arrhythmia, heart block or bradycardia requiring permanent pacemaker placement, congestive heart failure, respiratory failure, renal failure, pneumonia, infection, other late complications related to structural valve deterioration or migration, or other complications that might ultimately cause a temporary or permanent loss of functional independence or other long term morbidity. The patient provides full informed consent for the procedure as described and all questions were answered.    Plan:  Transfemoral TAVR using a 23 mm Medtronic Evolut Pro Valve on Tuesday 06/06/2018.    Gaye Pollack, MD

## 2018-06-06 ENCOUNTER — Inpatient Hospital Stay (HOSPITAL_COMMUNITY): Payer: Medicare Other | Admitting: Vascular Surgery

## 2018-06-06 ENCOUNTER — Encounter (HOSPITAL_COMMUNITY): Payer: Self-pay | Admitting: Certified Registered"

## 2018-06-06 ENCOUNTER — Inpatient Hospital Stay (HOSPITAL_COMMUNITY)
Admission: RE | Admit: 2018-06-06 | Discharge: 2018-06-07 | DRG: 267 | Disposition: A | Discharge status: Home / Self Care | Payer: Medicare Other | Attending: Cardiovascular Disease | Admitting: Cardiovascular Disease

## 2018-06-06 ENCOUNTER — Inpatient Hospital Stay (HOSPITAL_COMMUNITY): Payer: Medicare Other | Admitting: Certified Registered"

## 2018-06-06 ENCOUNTER — Other Ambulatory Visit: Payer: Self-pay

## 2018-06-06 ENCOUNTER — Ambulatory Visit (HOSPITAL_COMMUNITY): Payer: Medicare Other

## 2018-06-06 ENCOUNTER — Inpatient Hospital Stay (HOSPITAL_COMMUNITY): Payer: Medicare Other

## 2018-06-06 ENCOUNTER — Other Ambulatory Visit: Payer: Medicare Other

## 2018-06-06 ENCOUNTER — Ambulatory Visit: Payer: Medicare Other | Admitting: Cardiology

## 2018-06-06 ENCOUNTER — Encounter (HOSPITAL_COMMUNITY)
Admission: RE | Disposition: A | Discharge status: Home / Self Care | Payer: Self-pay | Source: Home / Self Care | Attending: Cardiovascular Disease

## 2018-06-06 DIAGNOSIS — M159 Polyosteoarthritis, unspecified: Secondary | ICD-10-CM | POA: Diagnosis present

## 2018-06-06 DIAGNOSIS — Z952 Presence of prosthetic heart valve: Secondary | ICD-10-CM | POA: Diagnosis not present

## 2018-06-06 DIAGNOSIS — Z8261 Family history of arthritis: Secondary | ICD-10-CM

## 2018-06-06 DIAGNOSIS — I5032 Chronic diastolic (congestive) heart failure: Secondary | ICD-10-CM | POA: Diagnosis present

## 2018-06-06 DIAGNOSIS — Z8249 Family history of ischemic heart disease and other diseases of the circulatory system: Secondary | ICD-10-CM | POA: Diagnosis not present

## 2018-06-06 DIAGNOSIS — I350 Nonrheumatic aortic (valve) stenosis: Secondary | ICD-10-CM | POA: Diagnosis not present

## 2018-06-06 DIAGNOSIS — Z7982 Long term (current) use of aspirin: Secondary | ICD-10-CM

## 2018-06-06 DIAGNOSIS — I2510 Atherosclerotic heart disease of native coronary artery without angina pectoris: Secondary | ICD-10-CM | POA: Diagnosis present

## 2018-06-06 DIAGNOSIS — E785 Hyperlipidemia, unspecified: Secondary | ICD-10-CM | POA: Diagnosis present

## 2018-06-06 DIAGNOSIS — K219 Gastro-esophageal reflux disease without esophagitis: Secondary | ICD-10-CM | POA: Diagnosis present

## 2018-06-06 DIAGNOSIS — Z888 Allergy status to other drugs, medicaments and biological substances status: Secondary | ICD-10-CM

## 2018-06-06 DIAGNOSIS — Z823 Family history of stroke: Secondary | ICD-10-CM

## 2018-06-06 DIAGNOSIS — Z6841 Body Mass Index (BMI) 40.0 and over, adult: Secondary | ICD-10-CM | POA: Diagnosis not present

## 2018-06-06 DIAGNOSIS — Z79899 Other long term (current) drug therapy: Secondary | ICD-10-CM | POA: Diagnosis not present

## 2018-06-06 DIAGNOSIS — I10 Essential (primary) hypertension: Secondary | ICD-10-CM | POA: Diagnosis present

## 2018-06-06 DIAGNOSIS — Z006 Encounter for examination for normal comparison and control in clinical research program: Secondary | ICD-10-CM

## 2018-06-06 DIAGNOSIS — Z96651 Presence of right artificial knee joint: Secondary | ICD-10-CM | POA: Diagnosis present

## 2018-06-06 HISTORY — DX: Nonrheumatic aortic (valve) stenosis: I35.0

## 2018-06-06 HISTORY — DX: Presence of prosthetic heart valve: Z95.2

## 2018-06-06 HISTORY — DX: Morbid (severe) obesity due to excess calories: E66.01

## 2018-06-06 HISTORY — PX: TEE WITHOUT CARDIOVERSION: SHX5443

## 2018-06-06 HISTORY — PX: TRANSCATHETER AORTIC VALVE REPLACEMENT, TRANSFEMORAL: SHX6400

## 2018-06-06 LAB — POCT I-STAT 4, (NA,K, GLUC, HGB,HCT)
Glucose, Bld: 136 mg/dL — ABNORMAL HIGH (ref 70–99)
Glucose, Bld: 151 mg/dL — ABNORMAL HIGH (ref 70–99)
Glucose, Bld: 182 mg/dL — ABNORMAL HIGH (ref 70–99)
HCT: 30 % — ABNORMAL LOW (ref 36.0–46.0)
HCT: 35 % — ABNORMAL LOW (ref 36.0–46.0)
HCT: 31 % — ABNORMAL LOW (ref 36.0–46.0)
Hemoglobin: 10.2 g/dL — ABNORMAL LOW (ref 12.0–15.0)
Hemoglobin: 11.9 g/dL — ABNORMAL LOW (ref 12.0–15.0)
Hemoglobin: 10.5 g/dL — ABNORMAL LOW (ref 12.0–15.0)
Potassium: 4.2 mmol/L (ref 3.5–5.1)
Potassium: 4.2 mmol/L (ref 3.5–5.1)
Potassium: 4.2 mmol/L (ref 3.5–5.1)
Sodium: 137 mmol/L (ref 135–145)
Sodium: 138 mmol/L (ref 135–145)
Sodium: 137 mmol/L (ref 135–145)

## 2018-06-06 LAB — POCT I-STAT 7, (LYTES, BLD GAS, ICA,H+H)
Acid-base deficit: 2 mmol/L (ref 0.0–2.0)
Bicarbonate: 24 mmol/L (ref 20.0–28.0)
Calcium, Ion: 1.27 mmol/L (ref 1.15–1.40)
HCT: 33 % — ABNORMAL LOW (ref 36.0–46.0)
Hemoglobin: 11.2 g/dL — ABNORMAL LOW (ref 12.0–15.0)
O2 Saturation: 90 %
Potassium: 4 mmol/L (ref 3.5–5.1)
Sodium: 140 mmol/L (ref 135–145)
TCO2: 25 mmol/L (ref 22–32)
pCO2 arterial: 47.1 mmHg (ref 32.0–48.0)
pH, Arterial: 7.316 — ABNORMAL LOW (ref 7.350–7.450)
pO2, Arterial: 63 mmHg — ABNORMAL LOW (ref 83.0–108.0)

## 2018-06-06 LAB — POCT I-STAT CREATININE: Creatinine, Ser: 0.7 mg/dL (ref 0.44–1.00)

## 2018-06-06 SURGERY — IMPLANTATION, AORTIC VALVE, TRANSCATHETER, FEMORAL APPROACH
Anesthesia: General | Site: Chest

## 2018-06-06 MED ORDER — SUCCINYLCHOLINE CHLORIDE 200 MG/10ML IV SOSY
PREFILLED_SYRINGE | INTRAVENOUS | Status: AC
  Filled 2018-06-06: qty 10

## 2018-06-06 MED ORDER — CALCIUM CHLORIDE 10 % IV SOLN
INTRAVENOUS | Status: DC | PRN
  Administered 2018-06-06: 100 mg via INTRAVENOUS

## 2018-06-06 MED ORDER — SODIUM CHLORIDE 0.9 % IV SOLN: INTRAVENOUS | Status: DC

## 2018-06-06 MED ORDER — ONDANSETRON HCL 4 MG/2ML IJ SOLN: 4.0000 mg | Freq: Four times a day (QID) | INTRAMUSCULAR | Status: DC | PRN

## 2018-06-06 MED ORDER — VANCOMYCIN HCL IN DEXTROSE 1-5 GM/200ML-% IV SOLN
1000.0000 mg | Freq: Once | INTRAVENOUS | Status: AC
  Administered 2018-06-07: 1000 mg via INTRAVENOUS
  Filled 2018-06-06: qty 200

## 2018-06-06 MED ORDER — 0.9 % SODIUM CHLORIDE (POUR BTL) OPTIME
TOPICAL | Status: DC | PRN
  Administered 2018-06-06: 16:00:00 3000 mL

## 2018-06-06 MED ORDER — CHLORHEXIDINE GLUCONATE 4 % EX LIQD: 60.0000 mL | Freq: Once | CUTANEOUS | Status: DC

## 2018-06-06 MED ORDER — NITROGLYCERIN IN D5W 200-5 MCG/ML-% IV SOLN: 0.0000 ug/min | INTRAVENOUS | Status: DC

## 2018-06-06 MED ORDER — HEPARIN SODIUM (PORCINE) 1000 UNIT/ML IJ SOLN
INTRAMUSCULAR | Status: DC | PRN
  Administered 2018-06-06: 17000 [IU] via INTRAVENOUS

## 2018-06-06 MED ORDER — LACTATED RINGERS IV SOLN: INTRAVENOUS | Status: DC

## 2018-06-06 MED ORDER — SODIUM CHLORIDE 0.9 % IV SOLN
INTRAVENOUS | Status: AC
  Filled 2018-06-06 (×3): qty 1.2

## 2018-06-06 MED ORDER — PANTOPRAZOLE SODIUM 40 MG PO TBEC
40.0000 mg | DELAYED_RELEASE_TABLET | Freq: Every day | ORAL | Status: DC
  Administered 2018-06-06 – 2018-06-07 (×2): 40 mg via ORAL
  Filled 2018-06-06 (×2): qty 1

## 2018-06-06 MED ORDER — MORPHINE SULFATE (PF) 2 MG/ML IV SOLN: 1.0000 mg | INTRAVENOUS | Status: DC | PRN

## 2018-06-06 MED ORDER — DEXMEDETOMIDINE HCL 200 MCG/2ML IV SOLN: INTRAVENOUS | Status: DC | PRN

## 2018-06-06 MED ORDER — SUCCINYLCHOLINE CHLORIDE 20 MG/ML IJ SOLN
INTRAMUSCULAR | Status: DC | PRN
  Administered 2018-06-06: 120 mg via INTRAVENOUS

## 2018-06-06 MED ORDER — ACETAMINOPHEN 325 MG PO TABS: 650.0000 mg | ORAL_TABLET | Freq: Four times a day (QID) | ORAL | Status: DC | PRN

## 2018-06-06 MED ORDER — PROTAMINE SULFATE 10 MG/ML IV SOLN
INTRAVENOUS | Status: AC
  Filled 2018-06-06: qty 5

## 2018-06-06 MED ORDER — ROSUVASTATIN CALCIUM 20 MG PO TABS
20.0000 mg | ORAL_TABLET | ORAL | Status: DC
  Administered 2018-06-07: 08:00:00 20 mg via ORAL
  Filled 2018-06-06: qty 1

## 2018-06-06 MED ORDER — ACETAMINOPHEN 650 MG RE SUPP: 650.0000 mg | Freq: Four times a day (QID) | RECTAL | Status: DC | PRN

## 2018-06-06 MED ORDER — SODIUM CHLORIDE 0.9% FLUSH: 3.0000 mL | INTRAVENOUS | Status: DC | PRN

## 2018-06-06 MED ORDER — MIDAZOLAM HCL 2 MG/2ML IJ SOLN
INTRAMUSCULAR | Status: AC
  Filled 2018-06-06: qty 2

## 2018-06-06 MED ORDER — LACTATED RINGERS IV SOLN
INTRAVENOUS | Status: DC
  Administered 2018-06-06 (×2): via INTRAVENOUS

## 2018-06-06 MED ORDER — OXYCODONE HCL 5 MG PO TABS: 5.0000 mg | ORAL_TABLET | ORAL | Status: DC | PRN

## 2018-06-06 MED ORDER — CHLORHEXIDINE GLUCONATE 4 % EX LIQD: 30.0000 mL | CUTANEOUS | Status: DC

## 2018-06-06 MED ORDER — LIDOCAINE 2% (20 MG/ML) 5 ML SYRINGE
INTRAMUSCULAR | Status: DC | PRN
  Administered 2018-06-06: 25 mg via INTRAVENOUS

## 2018-06-06 MED ORDER — CALCIUM CHLORIDE 10 % IV SOLN
INTRAVENOUS | Status: AC
  Filled 2018-06-06: qty 10

## 2018-06-06 MED ORDER — FENTANYL CITRATE (PF) 250 MCG/5ML IJ SOLN
INTRAMUSCULAR | Status: DC | PRN
  Administered 2018-06-06: 100 ug via INTRAVENOUS
  Administered 2018-06-06: 50 ug via INTRAVENOUS

## 2018-06-06 MED ORDER — ASPIRIN EC 81 MG PO TBEC
81.0000 mg | DELAYED_RELEASE_TABLET | Freq: Every day | ORAL | Status: DC
  Administered 2018-06-06 – 2018-06-07 (×2): 81 mg via ORAL
  Filled 2018-06-06 (×2): qty 1

## 2018-06-06 MED ORDER — SODIUM CHLORIDE 0.9 % IV SOLN
INTRAVENOUS | Status: AC
  Administered 2018-06-06: 18:00:00 50 mL/h via INTRAVENOUS

## 2018-06-06 MED ORDER — PROPOFOL 10 MG/ML IV BOLUS
INTRAVENOUS | Status: DC | PRN
  Administered 2018-06-06: 110 mg via INTRAVENOUS

## 2018-06-06 MED ORDER — SODIUM CHLORIDE 0.9 % IV SOLN
1.5000 g | Freq: Two times a day (BID) | INTRAVENOUS | Status: DC
  Administered 2018-06-07: 1.5 g via INTRAVENOUS
  Filled 2018-06-06 (×2): qty 1.5

## 2018-06-06 MED ORDER — SODIUM CHLORIDE 0.9% FLUSH
3.0000 mL | Freq: Two times a day (BID) | INTRAVENOUS | Status: DC
  Administered 2018-06-07: 3 mL via INTRAVENOUS

## 2018-06-06 MED ORDER — FENTANYL CITRATE (PF) 250 MCG/5ML IJ SOLN
INTRAMUSCULAR | Status: AC
  Filled 2018-06-06: qty 5

## 2018-06-06 MED ORDER — PROTAMINE SULFATE 10 MG/ML IV SOLN
INTRAVENOUS | Status: DC | PRN
  Administered 2018-06-06: 170 mg via INTRAVENOUS

## 2018-06-06 MED ORDER — SODIUM CHLORIDE 0.9 % IV SOLN
INTRAVENOUS | Status: DC | PRN
  Administered 2018-06-06: 1500 mL

## 2018-06-06 MED ORDER — IODIXANOL 320 MG/ML IV SOLN
INTRAVENOUS | Status: DC | PRN
  Administered 2018-06-06: 16:00:00 88 mL via INTRAVENOUS

## 2018-06-06 MED ORDER — CHLORHEXIDINE GLUCONATE 0.12 % MT SOLN
15.0000 mL | Freq: Once | OROMUCOSAL | Status: AC
  Administered 2018-06-06: 13:00:00 15 mL via OROMUCOSAL
  Filled 2018-06-06: qty 15

## 2018-06-06 MED ORDER — TRAMADOL HCL 50 MG PO TABS: 50.0000 mg | ORAL_TABLET | ORAL | Status: DC | PRN

## 2018-06-06 MED ORDER — LIDOCAINE 2% (20 MG/ML) 5 ML SYRINGE
INTRAMUSCULAR | Status: AC
  Filled 2018-06-06: qty 5

## 2018-06-06 MED ORDER — CLOPIDOGREL BISULFATE 75 MG PO TABS
75.0000 mg | ORAL_TABLET | Freq: Every day | ORAL | Status: DC
  Administered 2018-06-07: 08:00:00 75 mg via ORAL
  Filled 2018-06-06: qty 1

## 2018-06-06 MED ORDER — MIDAZOLAM HCL 2 MG/2ML IJ SOLN
INTRAMUSCULAR | Status: DC | PRN
  Administered 2018-06-06: 1 mg via INTRAVENOUS

## 2018-06-06 MED ORDER — PHENYLEPHRINE HCL-NACL 20-0.9 MG/250ML-% IV SOLN
0.0000 ug/min | INTRAVENOUS | Status: DC
  Filled 2018-06-06: qty 250

## 2018-06-06 MED ORDER — SODIUM CHLORIDE 0.9 % IV SOLN: 250.0000 mL | INTRAVENOUS | Status: DC | PRN

## 2018-06-06 SURGICAL SUPPLY — 92 items
BAG DECANTER FOR FLEXI CONT (MISCELLANEOUS) ×1 IMPLANT
BAG SNAP BAND KOVER 36X36 (MISCELLANEOUS) ×3 IMPLANT
BLADE CLIPPER SURG (BLADE) IMPLANT
BLADE OSCILLATING /SAGITTAL (BLADE) IMPLANT
BLADE STERNUM SYSTEM 6 (BLADE) IMPLANT
BLADE SURG 10 STRL SS (BLADE) ×3 IMPLANT
CABLE ADAPT CONN TEMP 6FT (ADAPTER) ×4 IMPLANT
CANNULA FEM VENOUS REMOTE 22FR (CANNULA) IMPLANT
CANNULA OPTISITE PERFUSION 16F (CANNULA) IMPLANT
CANNULA OPTISITE PERFUSION 18F (CANNULA) IMPLANT
CATH DIAG EXPO 6F VENT PIG 145 (CATHETERS) ×6 IMPLANT
CATH EXPO 5FR AL1 (CATHETERS) ×1 IMPLANT
CATH EXTERNAL FEMALE PUREWICK (CATHETERS) IMPLANT
CATH INFINITI 6F AL2 (CATHETERS) IMPLANT
CATH S G BIP PACING (CATHETERS) ×3 IMPLANT
CHLORAPREP W/TINT 26ML (MISCELLANEOUS) ×3 IMPLANT
CLIP VESOCCLUDE MED 24/CT (CLIP) IMPLANT
CLIP VESOCCLUDE SM WIDE 24/CT (CLIP) IMPLANT
CLOSURE MYNX CONTROL 6F/7F (Vascular Products) ×1 IMPLANT
CONT SPEC 4OZ CLIKSEAL STRL BL (MISCELLANEOUS) ×5 IMPLANT
COVER BACK TABLE 80X110 HD (DRAPES) ×3 IMPLANT
COVER DOME SNAP 22 D (MISCELLANEOUS) IMPLANT
COVER WAND RF STERILE (DRAPES) ×3 IMPLANT
CRADLE DONUT ADULT HEAD (MISCELLANEOUS) ×3 IMPLANT
DECANTER SPIKE VIAL GLASS SM (MISCELLANEOUS) ×2 IMPLANT
DERMABOND ADVANCED (GAUZE/BANDAGES/DRESSINGS) ×1
DERMABOND ADVANCED .7 DNX12 (GAUZE/BANDAGES/DRESSINGS) ×2 IMPLANT
DEVICE CLOSURE PERCLS PRGLD 6F (VASCULAR PRODUCTS) ×4 IMPLANT
DRAPE INCISE IOBAN 66X45 STRL (DRAPES) IMPLANT
DRSG TEGADERM 4X4.75 (GAUZE/BANDAGES/DRESSINGS) ×5 IMPLANT
ELECT CAUTERY BLADE 6.4 (BLADE) IMPLANT
ELECT REM PT RETURN 9FT ADLT (ELECTROSURGICAL) ×6
ELECTRODE REM PT RTRN 9FT ADLT (ELECTROSURGICAL) ×4 IMPLANT
FELT TEFLON 6X6 (MISCELLANEOUS) IMPLANT
FEMORAL VENOUS CANN RAP (CANNULA) IMPLANT
GAUZE 4X4 16PLY RFD (DISPOSABLE) ×1 IMPLANT
GAUZE SPONGE 4X4 12PLY STRL (GAUZE/BANDAGES/DRESSINGS) ×3 IMPLANT
GLOVE BIO SURGEON STRL SZ7.5 (GLOVE) ×3 IMPLANT
GLOVE BIO SURGEON STRL SZ8 (GLOVE) IMPLANT
GLOVE EUDERMIC 7 POWDERFREE (GLOVE) IMPLANT
GLOVE ORTHO TXT STRL SZ7.5 (GLOVE) IMPLANT
GOWN STRL REUS W/ TWL LRG LVL3 (GOWN DISPOSABLE) IMPLANT
GOWN STRL REUS W/ TWL XL LVL3 (GOWN DISPOSABLE) ×2 IMPLANT
GOWN STRL REUS W/TWL LRG LVL3 (GOWN DISPOSABLE)
GOWN STRL REUS W/TWL XL LVL3 (GOWN DISPOSABLE) ×1
GUIDEWIRE SAFE TJ AMPLATZ EXST (WIRE) ×3 IMPLANT
GUIDEWIRE STRAIGHT .035 260CM (WIRE) ×3 IMPLANT
INSERT FOGARTY SM (MISCELLANEOUS) IMPLANT
KIT BASIN OR (CUSTOM PROCEDURE TRAY) ×3 IMPLANT
KIT DILATOR VASC 18G NDL (KITS) IMPLANT
KIT HEART LEFT (KITS) ×3 IMPLANT
KIT SUCTION CATH 14FR (SUCTIONS) IMPLANT
KIT TURNOVER KIT B (KITS) ×3 IMPLANT
LOOP VESSEL MAXI BLUE (MISCELLANEOUS) IMPLANT
LOOP VESSEL MINI RED (MISCELLANEOUS) IMPLANT
NEEDLE 22X1 1/2 (OR ONLY) (NEEDLE) ×3 IMPLANT
NS IRRIG 1000ML POUR BTL (IV SOLUTION) ×5 IMPLANT
PACK ENDO MINOR (CUSTOM PROCEDURE TRAY) ×3 IMPLANT
PAD ARMBOARD 7.5X6 YLW CONV (MISCELLANEOUS) ×6 IMPLANT
PAD ELECT DEFIB RADIOL ZOLL (MISCELLANEOUS) ×3 IMPLANT
PENCIL BUTTON HOLSTER BLD 10FT (ELECTRODE) IMPLANT
PERCLOSE PROGLIDE 6F (VASCULAR PRODUCTS) ×6
SET MICROPUNCTURE 5F STIFF (MISCELLANEOUS) ×3 IMPLANT
SHEATH BRITE TIP 6FR 35CM (SHEATH) ×3 IMPLANT
SHEATH BRITE TIP 7FR 35CM (SHEATH) ×1 IMPLANT
SHEATH PINNACLE 6F 10CM (SHEATH) ×3 IMPLANT
SHEATH PINNACLE 8F 10CM (SHEATH) ×3 IMPLANT
SLEEVE REPOSITIONING LENGTH 30 (MISCELLANEOUS) ×3 IMPLANT
SPONGE LAP 4X18 RFD (DISPOSABLE) IMPLANT
STOPCOCK MORSE 400PSI 3WAY (MISCELLANEOUS) ×6 IMPLANT
SUT ETHIBOND X763 2 0 SH 1 (SUTURE) IMPLANT
SUT GORETEX CV 4 TH 22 36 (SUTURE) IMPLANT
SUT GORETEX CV4 TH-18 (SUTURE) IMPLANT
SUT MNCRL AB 3-0 PS2 18 (SUTURE) IMPLANT
SUT PROLENE 5 0 C 1 36 (SUTURE) IMPLANT
SUT PROLENE 6 0 C 1 30 (SUTURE) IMPLANT
SUT SILK  1 MH (SUTURE)
SUT SILK 1 MH (SUTURE) ×2 IMPLANT
SUT VIC AB 2-0 CT1 27 (SUTURE)
SUT VIC AB 2-0 CT1 TAPERPNT 27 (SUTURE) IMPLANT
SUT VIC AB 2-0 CTX 36 (SUTURE) IMPLANT
SUT VIC AB 3-0 SH 8-18 (SUTURE) IMPLANT
SYR 50ML LL SCALE MARK (SYRINGE) ×3 IMPLANT
SYR BULB IRRIGATION 50ML (SYRINGE) IMPLANT
SYR CONTROL 10ML LL (SYRINGE) IMPLANT
SYR MEDRAD MARK V 150ML (SYRINGE) ×3 IMPLANT
TOWEL GREEN STERILE (TOWEL DISPOSABLE) ×6 IMPLANT
TRANSDUCER W/STOPCOCK (MISCELLANEOUS) ×6 IMPLANT
TRAY FOLEY SLVR 16FR TEMP STAT (SET/KITS/TRAYS/PACK) IMPLANT
URINAL MALE W/LID DISP 1000CC (MISCELLANEOUS) IMPLANT
VALVE AORTIC EVOLT PROPLUS 23 (Valve) ×1 IMPLANT
WIRE .035 3MM-J 145CM (WIRE) ×3 IMPLANT

## 2018-06-06 NOTE — Anesthesia Preprocedure Evaluation (Addendum)
Anesthesia Evaluation  Patient identified by MRN, date of birth, ID band Patient awake    Reviewed: Allergy & Precautions, NPO status , Patient's Chart, lab work & pertinent test results  Airway Mallampati: II  TM Distance: >3 FB Neck ROM: Full    Dental  (+) Teeth Intact, Dental Advisory Given   Pulmonary neg pulmonary ROS,    breath sounds clear to auscultation       Cardiovascular hypertension, Pt. on medications and Pt. on home beta blockers + CAD  + Valvular Problems/Murmurs AS  Rhythm:Regular Rate:Normal + Systolic murmurs    Neuro/Psych negative neurological ROS     GI/Hepatic Neg liver ROS, GERD  Medicated,  Endo/Other  negative endocrine ROS  Renal/GU negative Renal ROS     Musculoskeletal   Abdominal (+) + obese,   Peds  Hematology negative hematology ROS (+)   Anesthesia Other Findings   Reproductive/Obstetrics                            Lab Results  Component Value Date   WBC 6.1 06/02/2018   HGB 12.9 06/02/2018   HCT 38.7 06/02/2018   MCV 97.0 06/02/2018   PLT 235 06/02/2018   Lab Results  Component Value Date   CREATININE 0.85 06/02/2018   BUN 12 06/02/2018   NA 138 06/02/2018   K 4.1 06/02/2018   CL 103 06/02/2018   CO2 24 06/02/2018   Lab Results  Component Value Date   INR 1.0 06/02/2018   INR 0.93 10/08/2013   INR 1.45 02/05/2011   COVID-19 Labs  No results for input(s): DDIMER, FERRITIN, LDH, CRP in the last 72 hours.  Lab Results  Component Value Date   SARSCOV2NAA NOT DETECTED 06/02/2018   Echo: 1. The left ventricle has hyperdynamic systolic function, with an ejection fraction of >65%. The cavity size was normal. There is mildly increased left ventricular wall thickness. Left ventricular diastolic Doppler parameters are consistent with  impaired relaxation.  2. The right ventricle has normal systolic function. The cavity was normal. There is no  increase in right ventricular wall thickness.  3. The mitral valve is normal in structure.  4. The tricuspid valve is normal in structure.  5. The aortic valve was not well visualized.  6. AV is difficult to see well Peak and mean gradients through the valve are 68 and 43 mm Hg respectively consistent with severe AS Since previous echo from October 2019, these gradients are increased.  Anesthesia Physical Anesthesia Plan  ASA: IV  Anesthesia Plan: General   Post-op Pain Management:    Induction: Intravenous  PONV Risk Score and Plan: 4 or greater and Treatment may vary due to age or medical condition and Ondansetron  Airway Management Planned: Oral ETT  Additional Equipment: Arterial line  Intra-op Plan:   Post-operative Plan: Extubation in OR  Informed Consent: I have reviewed the patients History and Physical, chart, labs and discussed the procedure including the risks, benefits and alternatives for the proposed anesthesia with the patient or authorized representative who has indicated his/her understanding and acceptance.     Dental advisory given  Plan Discussed with: CRNA  Anesthesia Plan Comments:        Anesthesia Quick Evaluation

## 2018-06-06 NOTE — H&P (Signed)
Cardiology Admission History and Physical:   Patient ID: Anna Gamble MRN: [id number]; DOB: 19-Sep-1935   Admission date: 06/06/2018  Primary Care Provider: Burnis Medin, MD Primary Cardiologist:  [name] Primary Electrophysiologist:  None   Chief Complaint: dyspnea  Patient Profile:   Anna Gamble is a 83 y.o. female with history of HTN, HLD and severe aortic stenosis here today for TAVR.   History of Present Illness:   Anna Gamble has severe AS, mild CAD, HTN and HLD. Recent worsened fatigue and dyspnea. Here today for TAVR. Femoral approach.    Past Medical History:  Diagnosis Date  . CAD (coronary artery disease) 03/19/2014  . Colon polyp   . Coronary artery disease   . GERD (gastroesophageal reflux disease)   . Hx of colonoscopy with polypectomy 07/14/2012   [facility]   . Hyperlipidemia   . Hypertension   . Impaired fasting glucose   . Morbid obesity ([place])   . Severe aortic stenosis     Past Surgical History:  Procedure Laterality Date  . APPENDECTOMY    . CHOLECYSTECTOMY    . FOOT SURGERY     rt  . KNEE SURGERY     rt  . LEFT AND RIGHT HEART CATHETERIZATION WITH CORONARY ANGIOGRAM N/A 10/10/2013   Procedure: LEFT AND RIGHT HEART CATHETERIZATION WITH CORONARY ANGIOGRAM;  Surgeon: Burnell Blanks, MD;  Location: [facility] CATH LAB;  Service: Cardiovascular;  Laterality: N/A;  . RIGHT/LEFT HEART CATH AND CORONARY ANGIOGRAPHY N/A 04/06/2018   Procedure: RIGHT/LEFT HEART CATH AND CORONARY ANGIOGRAPHY;  Surgeon: Burnell Blanks, MD;  Location: [place] CV LAB;  Service: Cardiovascular;  Laterality: N/A;  . TONSILLECTOMY    . TOTAL KNEE ARTHROPLASTY  02/01/2011   Procedure: TOTAL KNEE ARTHROPLASTY;  Surgeon: Kerin Salen;  Location: [place];  Service: Orthopedics;  Laterality: Right;  DEPUY/SIGMA     Medications Prior to Admission: Prior to Admission medications   Medication Sig Start Date End Date Taking? Authorizing Provider  aspirin 81 MG tablet  Take 81 mg by mouth daily.   Yes [provider]  calcium carbonate (TUMS - DOSED IN MG ELEMENTAL CALCIUM) 500 MG chewable tablet Chew 2 tablets by mouth daily as needed for indigestion or heartburn.   Yes [provider]  cholecalciferol (VITAMIN D) 1000 UNITS tablet Take 1,000 Units by mouth daily.     Yes [provider]  hydrochlorothiazide (MICROZIDE) 12.5 MG capsule Take 12.5 mg by mouth daily.   Yes [provider]  losartan (COZAAR) 25 MG tablet Take 50 mg by mouth daily.   Yes [provider]  metoprolol succinate (TOPROL-XL) 50 MG 24 hr tablet TAKE 1 TABLET BY MOUTH  DAILY Patient taking differently: Take 50 mg by mouth daily.  01/04/18  Yes Crenshaw, Denice Bors, MD  Misc Natural Products (TART CHERRY ADVANCED PO) Take 1 capsule by mouth daily.    Yes [provider]  Multiple Vitamin (MULTIVITAMINS PO) Take 1 tablet by mouth daily.    Yes [provider]  Omega-3 1000 MG CAPS Take 1,000 mg by mouth every morning.    Yes [provider]  omeprazole-sodium bicarbonate (ZEGERID) 40-1100 MG per capsule Take 1 capsule by mouth 2 (two) times a week. Tuesdays and Thursdays   Yes [provider]  Polyethyl Glycol-Propyl Glycol (SYSTANE) 0.4-0.3 % GEL ophthalmic gel Place 1 application into both eyes 2 (two) times daily.   Yes [provider]  rosuvastatin (CRESTOR) 40 MG tablet Take  1 tablet (40 mg total) by mouth every other day. Patient taking differently: Take 20 mg by mouth every other day.  01/04/18  Yes Lelon Perla, MD  ibuprofen (ADVIL) 200 MG tablet Take 400 mg by mouth every 6 (six) hours as needed for headache or moderate pain.    [provider]  Menthol, Topical Analgesic, (BENGAY EX) Apply 1 application topically daily as needed (back pain).    [provider]  Respiratory Therapy Supplies (NEBULIZER) DEVI by Does not apply route.    [provider]     Allergies:     Allergies  Allergen Reactions  . Lisinopril Cough  . Statins Other (See Comments)    muscle aches with some statins, tolerates low doses of crestor    Social History:   Social History   Socioeconomic History  . Marital status: Married    Spouse name: Not on file  . Number of children: 4  . Years of education: Not on file  . Highest education level: Not on file  Occupational History  . Occupation: Retired    Comment: Takes care of [employer]  . Occupation: Therapist, sports, peds, cardiology    Comment: retired  Scientific laboratory technician  . Financial resource strain: Not hard at all  . Food insecurity:    Worry: Never true    Inability: Never true  . Transportation needs:    Medical: No    Non-medical: No  Tobacco Use  . Smoking status: Never Smoker  . Smokeless tobacco: Never Used  Substance and Sexual Activity  . Alcohol use: No  . Drug use: No  . Sexual activity: Not Currently    Comment: quit 40 yrs ago  Lifestyle  . Physical activity:    Days per week: 0 days    Minutes per session: 0 min  . Stress: Only a little  Relationships  . Social connections:    Talks on phone: More than three times a week    Gets together: Once a week    Attends religious service: More than 4 times per year    Active member of club or organization: Yes    Attends meetings of clubs or organizations: More than 4 times per year    Relationship status: Married  . Intimate partner violence:    Fear of current or ex partner: No    Emotionally abused: No    Physically abused: No    Forced sexual activity: No  Other Topics Concern  . Not on file  Social History Narrative   hh of 3 -4  Married   [place] son   At home    Invalid son paraplegia and husband    And grandson recently     No pets   Fluctuating hh membors she cooks and prepares for them           Family History:   The patient's family history includes Aortic stenosis in her sister; Arthritis in her sister; Coronary artery disease in some  other family members; Hearing loss in her son; Heart Problems in her brother; Heart attack in her brother, father, and mother; Hyperlipidemia in an other family member; Hypertension in an other family member; Osteoarthritis in her sister; Rheum arthritis in her sister; Sleep apnea in her son; Stroke in her brother and brother.    ROS:  Please see the history of present illness.  All other ROS reviewed and negative.     Physical Exam/Data:   Vitals:  06/06/18 1302 06/06/18 1311  BP: (!) 150/50   Pulse:  67  Resp: 20   Temp: 98.2 F (36.8 C)   SpO2:  97%  Weight: 112.9 kg   Height: 5\' 2"  (1.575 m)    No intake or output data in the 24 hours ending 06/06/18 1508 Last 3 Weights 06/06/2018 06/02/2018 06/02/2018  Weight (lbs) 249 lb 249 lb 245 lb  Weight (kg) 112.946 kg 112.946 kg 111.131 kg     Body mass index is 45.54 kg/m.  General:  Well nourished, well developed, in no acute distress HEENT: normal Lymph: no adenopathy Neck: no JVD Endocrine:  No thryomegaly Vascular: No carotid bruits; FA pulses 2+ bilaterally without bruits  Cardiac:  normal S1, S2; RRR; systolic murmur.  Lungs:  clear to auscultation bilaterally, no wheezing, rhonchi or rales  Abd: soft, nontender, no hepatomegaly  Ext: no LE edema Musculoskeletal:  No deformities, BUE and BLE strength normal and equal Skin: warm and dry  Neuro:  CNs 2-12 intact, no focal abnormalities noted Psych:  Normal affect   Laboratory Data:  Chemistry Recent Labs  Lab 06/02/18 1348  NA 138  K 4.1  CL 103  CO2 24  GLUCOSE 147*  BUN 12  CREATININE 0.85  CALCIUM 9.2  GFRNONAA >60  GFRAA >60  ANIONGAP 11    Recent Labs  Lab 06/02/18 1348  PROT 6.0*  ALBUMIN 3.6  AST 27  ALT 33  ALKPHOS 69  BILITOT 0.7   Hematology Recent Labs  Lab 06/02/18 1348  WBC 6.1  RBC 3.99  HGB 12.9  HCT 38.7  MCV 97.0  MCH 32.3  MCHC 33.3  RDW 13.2  PLT 235   Cardiac EnzymesNo results for input(s): TROPONINI in the last 168  hours. No results for input(s): TROPIPOC in the last 168 hours.  BNP Recent Labs  Lab 06/02/18 1347  BNP 76.9    DDimer No results for input(s): DDIMER in the last 168 hours.  Radiology/Studies:  No results found.  Assessment and Plan:   1. Severe aortic stenosis :TAVR today from the right femoral approach.   Severity of Illness: The appropriate patient status for this patient is INPATIENT. Inpatient status is judged to be reasonable and necessary in order to provide the required intensity of service to ensure the patient's safety. The patient's presenting symptoms, physical exam findings, and initial radiographic and laboratory data in the context of their chronic comorbidities is felt to place them at high risk for further clinical deterioration. Furthermore, it is not anticipated that the patient will be medically stable for discharge from the hospital within 2 midnights of admission. The following factors support the patient status of inpatient.   " The patient's presenting symptoms include dyspnea. " The worrisome physical exam findings include murmur " The initial radiographic and laboratory data are worrisome because of severe AS " The chronic co-morbidities include HTN, HLD, AS   * I certify that at the point of admission it is my clinical judgment that the patient will require inpatient hospital care spanning beyond 2 midnights from the point of admission due to high intensity of service, high risk for further deterioration and high frequency of surveillance required.*    For questions or updates, please contact [place] Please consult [email or web address] for contact info under        Signed, Lauree Chandler, MD  06/06/2018 3:08 PM

## 2018-06-06 NOTE — Interval H&P Note (Signed)
History and Physical Interval Note:  06/06/2018 2:42 PM  Anna Gamble  has presented today for surgery, with the diagnosis of Severe Aortic Stenosis.  The various methods of treatment have been discussed with the patient and family. After consideration of risks, benefits and other options for treatment, the patient has consented to  Procedure(s): TRANSCATHETER AORTIC VALVE REPLACEMENT, TRANSFEMORAL (N/A) TRANSESOPHAGEAL ECHOCARDIOGRAM (TEE) (N/A) as a surgical intervention.  The patient's history has been reviewed, patient examined, no change in status, stable for surgery.  I have reviewed the patient's chart and labs.  Questions were answered to the patient's satisfaction.     Fernande Boyden Linet Brash   Shortness of breath: Yes.   If yes: with what activity?: any Worse than previously noted?: No.  New edema, PND, orthopnea: No.  Recent decrease in activity i.e. more difficulty walking to mailbox, climbing stairs, etc: No.  Changes in sleeping i.e. need to utilize to sleep on more pillows, sitting up, etc: No.  Changes since last seen in pre-op visit: No.

## 2018-06-06 NOTE — Transfer of Care (Signed)
Immediate Anesthesia Transfer of Care Note  Patient: Anna Gamble  Procedure(s) Performed: TRANSCATHETER AORTIC VALVE REPLACEMENT, TRANSFEMORAL (N/A Chest) TRANSESOPHAGEAL ECHOCARDIOGRAM (TEE) (N/A )  Patient Location: Cath Lab  Anesthesia Type:General  Level of Consciousness: drowsy and patient cooperative  Airway & Oxygen Therapy: Patient Spontanous Breathing and Patient connected to face mask oxygen  Post-op Assessment: Report given to RN, Post -op Vital signs reviewed and stable and Patient moving all extremities  Post vital signs: Reviewed and stable  Last Vitals:  Vitals Value Taken Time  BP 101/45 06/06/2018  5:43 PM  Temp    Pulse 71 06/06/2018  5:43 PM  Resp 15 06/06/2018  5:43 PM  SpO2 95 % 06/06/2018  5:43 PM  Vitals shown include unvalidated device data.  Last Pain:  Vitals:   06/06/18 1302  PainSc: 1       Patients Stated Pain Goal: 2 (38/46/65 [id number])  Complications: No apparent anesthesia complications

## 2018-06-06 NOTE — Anesthesia Procedure Notes (Signed)
Procedure Name: Intubation Date/Time: 06/06/2018 3:34 PM Performed by: Moshe Salisbury, CRNA Pre-anesthesia Checklist: Patient identified, Emergency Drugs available, Suction available and Patient being monitored Patient Re-evaluated:Patient Re-evaluated prior to induction Oxygen Delivery Method: Circle System Utilized Preoxygenation: Pre-oxygenation with 100% oxygen Induction Type: IV induction Ventilation: Mask ventilation without difficulty Laryngoscope Size: Mac and 3 Grade View: Grade I Tube type: Oral Tube size: 7.5 mm Number of attempts: 1 Airway Equipment and Method: Stylet Placement Confirmation: ETT inserted through vocal cords under direct vision,  positive ETCO2 and breath sounds checked- equal and bilateral Secured at: 21 cm Tube secured with: Tape Dental Injury: Teeth and Oropharynx as per pre-operative assessment

## 2018-06-06 NOTE — Anesthesia Procedure Notes (Signed)
Arterial Line Insertion Start/End5/19/2020 1:55 PM, 06/06/2018 2:05 PM Performed by: Moshe Salisbury, CRNA, CRNA  Patient location: Pre-op. Preanesthetic checklist: patient identified, IV checked, site marked, risks and benefits discussed, surgical consent, monitors and equipment checked, pre-op evaluation, timeout performed and anesthesia consent Lidocaine 1% used for infiltration Right, radial was placed Catheter size: 20 G Hand hygiene performed , maximum sterile barriers used  and Seldinger technique used  Attempts: 1 Procedure performed without using ultrasound guided technique. Following insertion, Biopatch and dressing applied. Post procedure assessment: normal  Patient tolerated the procedure well with no immediate complications.

## 2018-06-06 NOTE — Op Note (Signed)
[facility]   MULTIDISCIPLINARY HEART VALVE TEAM   TAVR OPERATIVE NOTE    Anna Gamble [id number]   Date of Procedure:  06/06/2018  Preoperative Diagnosis: Severe Aortic Stenosis   Postoperative Diagnosis: Same   Procedure:    Transcatheter Aortic Valve Replacement - Percutaneous Right Transfemoral Approach  Medtronic Evolut Pro + (size 23 mm,  serial # [id number])   Co-Surgeons:  Gaye Pollack, MD and Lauree Chandler, MD   Anesthesiologist:  Suella Broad, MD  Echocardiographer:  Jenkins Rouge, MD  Pre-operative Echo Findings:  Severe aortic stenosis  Normal left ventricular systolic function  Post-operative Echo Findings:  trivial paravalvular leak  Normal left ventricular systolic function   BRIEF CLINICAL NOTE AND INDICATIONS FOR SURGERY  This 83 year old morbidly obese woman has stage D, severe, symptomatic aortic stenosis with NYHA class II symptoms of exertional fatigue and shortness of breath consistent with chronic diastolic congestive heart failure. I have personally reviewed her echo, cath and CTA studies. Echo shows a calcified aortic valve with poor leaflet mobility. It is not possible to tell how many leaflets there are on this echo but her echo in October 2019 showed three leaflets that were severely thickened and calcified with restricted mobility. Her cardiac cath shows non-obstructive CAD. I agree that AVR is indicated in this 83 year old woman who is still active to improve her symptoms and quality of life and to prevent progressive left ventricular deterioration and congestive heart failure. I think TAVR is the best option for her due to her age and comorbid risk factors. Her gated cardiac CTA shows a relatively small area of 317 mm2 which would only allow insertion of a 20 mm Sapien 3 valve. I think this would be too small for her BSA of 2.2 m2. Her annulus would allow insertion of a 23 mm Medtronic Evolut Pro valve which will  minimize any patient-prosthesis mismatch. Her abdominal and pelvic CTA shows anatomy suitable for transfemoral insertion.  The patient was counseled at length regarding treatment alternatives for management of severe symptomatic aortic stenosis. The risks and benefits of surgical intervention has been discussed in detail. Long-term prognosis with medical therapy was discussed. Alternative approaches such as conventional surgical aortic valve replacement, transcatheter aortic valve replacement, and palliative medical therapy were compared and contrasted at length. This discussion was placed in the context of the patient's own specific clinical presentation and past medical history. All ofherquestions havebeen addressed.   Following the decision to proceed with transcatheter aortic valve replacement, a discussion was held regarding what types of management strategies would be attempted intraoperatively in the event of life-threatening complications, including whether or not the patient would be considered a candidate for the use of cardiopulmonary bypass and/or conversion to open sternotomy for attempted surgical intervention. The patient is aware of the fact that transient use of cardiopulmonary bypass may be necessary. She is 83 years old but still in reasonable health and I think she would be a candidate for sternotomy if needed to manage any intraoperative complications.  The patient has been advised of a variety of complications that might develop including but not limited to risks of death, stroke, paravalvular leak, aortic dissection or other major vascular complications, aortic annulus rupture, device embolization, cardiac rupture or perforation, mitral regurgitation, acute myocardial infarction, arrhythmia, heart block or bradycardia requiring permanent pacemaker placement, congestive heart failure, respiratory failure, renal failure, pneumonia, infection, other late complications related to  structural valve deterioration or migration, or other  complications that might ultimately cause a temporary or permanent loss of functional independence or other long term morbidity. The patient provides full informed consent for the procedure as described and all questions were answered.   DETAILS OF THE OPERATIVE PROCEDURE  PREPARATION:    The patient is brought to the operating room on the above mentioned date and central monitoring was established by the anesthesia team including placement of a radial arterial line. The patient is placed in the supine position on the operating table.  Intravenous antibiotics are administered.  General endotracheal anesthesia is induced uneventfully.   Baseline transesophageal echocardiogram was performed. The patient's chest, abdomen, both groins, and both lower extremities are prepared and draped in a sterile manner. A time out procedure is performed.   PERIPHERAL ACCESS:    Using the modified Seldinger technique, femoral arterial and venous access was obtained with placement of 6 Fr sheaths on the left side.  A pigtail diagnostic catheter was passed through the left arterial sheath under fluoroscopic guidance into the aortic root.  A temporary transvenous pacemaker catheter was passed through the left femoral venous sheath under fluoroscopic guidance into the right ventricle.  The pacemaker was tested to ensure stable lead placement and pacemaker capture.   TRANSFEMORAL ACCESS:   Percutaneous transfemoral access and sheath placement was performed by using ultrasound guidance.  The right common femoral artery was cannulated using a micropuncture needle and appropriate location was verified using hand injection angiogram.  A pair of Abbott Perclose percutaneous closure devices were placed and a 8 French sheath replaced into the femoral artery.  The patient was heparinized systemically and ACT verified > 250 seconds.    An 18 Fr Dry-Seal sheath was introduced  into the right femoral artery after progressively dilating over an Amplatz superstiff wire. An AL-1 catheter was used to direct a straight-tip exchange length wire across the native aortic valve into the left ventricle. This was exchanged out for a pigtail catheter and position was confirmed in the LV apex. Simultaneous LV and Ao pressures were recorded.  The pigtail catheter was exchanged for a Confida wire in the LV apex.  Echocardiography was utilized to confirm appropriate wire position and no sign of entanglement in the mitral subvalvular apparatus.   BALLOON AORTIC VALVULOPLASTY:   Not performed   TRANSCATHETER HEART VALVE DEPLOYMENT:   A Medtronic Evolut Pro transcatheter heart valve (size 23 mm, serial #[id number]) was prepared and crimped per manufacturer's guidelines, and the proper orientation of the valve is confirmed on the Medtronic EnVeo Pro delivery system. The valve was advanced through the introducer sheath using normal technique until in an appropriate position in the abdominal aorta beyond the sheath tip. The valve was then advanced across the aortic arch. The valve was carefully positioned across the aortic valve annulus. The paralax was removed from the delivery system by moving the detector into the LAO postion.  The valve is carefully deployed using the retractor dial so that 80% of the valve is released. TEE demonstrated trivial paravalvular AI and the patient's heart rhythm was stable. The valve was fully released during pacing at 120 bpm. There was felt to be trace paravalvular leak and no central aortic insufficiency. The patient's hemodynamic recovery following valve deployment is good.  The delivery catheter and guidewire were both removed.    PROCEDURE COMPLETION:   The sheath was removed and femoral artery closure performed using the previously placed Perclose devices. Protamine was administered once femoral arterial repair was complete. The temporary  pacemaker, pigtail  catheters and femoral sheaths were removed from the left groin with a Mynx closure device used for the femoral artery and manual pressure for the vein.   The patient tolerated the procedure well and is transported to the cath lab recovery area in stable condition. There were no immediate intraoperative complications. All sponge instrument and needle counts are verified correct at completion of the operation.   No blood products were administered during the operation.  The patient received a total of 88 mL of intravenous contrast during the procedure.   Gaye Pollack, MD

## 2018-06-06 NOTE — Progress Notes (Signed)
  Echocardiogram Echocardiogram Transesophageal has been performed.  Bobbye Charleston 06/06/2018, 5:03 PM

## 2018-06-06 NOTE — CV Procedure (Signed)
[facility]  TAVR OPERATIVE NOTE   Date of Procedure:  06/06/2018  Preoperative Diagnosis: Severe Aortic Stenosis   Postoperative Diagnosis: Same   Procedure:    Transcatheter Aortic Valve Replacement - Transfemoral Approach  Medtronic Evolut valve(size 23 mm, model # [id number], serial # [id number])   Co-Surgeons:  Lauree Chandler, MD and Gaye Pollack, MD  Anesthesiologist:  Smith Robert  Echocardiographer:  Johnsie Cancel  Pre-operative Echo Findings:  Severe aortic stenosis  Normal left ventricular systolic function  Post-operative Echo Findings:  Trivial paravalvular leak  No left ventricular systolic function  BRIEF CLINICAL NOTE AND INDICATIONS FOR SURGERY  83 yo female with severe aortic stenosis, normal LV systolic function and mild CAD. Recent progression of dyspnea on exertion.   During the course of the patient's preoperative work up they have been evaluated comprehensively by a multidisciplinary team of specialists coordinated through the [facility] in the [facility].  They have been demonstrated to suffer from symptomatic severe aortic stenosis as noted above. The patient has been counseled extensively as to the relative risks and benefits of all options for the treatment of severe aortic stenosis including long term medical therapy, conventional surgery for aortic valve replacement, and transcatheter aortic valve replacement.  The patient has been independently evaluated by Dr. Cyndia Bent with CT surgery and they are felt to be at high risk for conventional surgical aortic valve replacement. The surgeon indicated the patient would be a poor candidate for conventional surgery. Based upon review of all of the patient's preoperative diagnostic tests they are felt to be candidate for transcatheter aortic valve replacement using the transfemoral approach as an alternative to high risk conventional surgery.     Following the decision to proceed with transcatheter aortic valve replacement, a discussion has been held regarding what types of management strategies would be attempted intraoperatively in the event of life-threatening complications, including whether or not the patient would be considered a candidate for the use of cardiopulmonary bypass and/or conversion to open sternotomy for attempted surgical intervention.  The patient has been advised of a variety of complications that might develop peculiar to this approach including but not limited to risks of death, stroke, paravalvular leak, aortic dissection or other major vascular complications, aortic annulus rupture, device embolization, cardiac rupture or perforation, acute myocardial infarction, arrhythmia, heart block or bradycardia requiring permanent pacemaker placement, congestive heart failure, respiratory failure, renal failure, pneumonia, infection, other late complications related to structural valve deterioration or migration, or other complications that might ultimately cause a temporary or permanent loss of functional independence or other long term morbidity.  The patient provides full informed consent for the procedure as described and all questions were answered preoperatively.    DETAILS OF THE OPERATIVE PROCEDURE  PREPARATION:   The patient is brought to the operating room on the above mentioned date and central monitoring was established by the anesthesia team including placement of a radial arterial line. The patient is placed in the supine position on the operating table.  Intravenous antibiotics are administered. Conscious sedation is used.   Baseline transesophageal echocardiogram was performed. The patient's chest, abdomen, both groins, and both lower extremities are prepared and draped in a sterile manner. A time out procedure is performed.   PERIPHERAL ACCESS:   Using the modified Seldinger technique, femoral arterial and  venous access was obtained with placement of a 6 Fr sheath in the left femoral artery and placement of a 7 French sheath  in the left femoral vein using u/s guidance. A pigtail diagnostic catheter was passed through the femoral arterial sheath under fluoroscopic guidance into the aortic root.  A temporary transvenous pacemaker catheter was passed through the femoral venous sheath under fluoroscopic guidance into the right ventricle.  The pacemaker was tested to ensure stable lead placement and pacemaker capture.   TRANSFEMORAL ACCESS:  A micropuncture kit was used to gain access to the right femoral artery using u/s guidance. Pre-closure with double ProGlide closure devices. The patient was heparinized systemically and ACT verified > 250 seconds.    A 18  Fr DrySeal sheath was introduced into the right femoral artery after progressively dilating over an Amplatz superstiff wire. An AL-1 catheter was used to direct a straight-tip exchange length wire across the native aortic valve into the left ventricle. This was exchanged out for a pigtail catheter and position was confirmed in the LV apex. Simultaneous LV and Ao pressures were recorded.  The pigtail catheter was then exchanged for an Amplatz Extra-stiff wire in the LV apex.   TRANSCATHETER HEART VALVE DEPLOYMENT:  An Medtronic Evolut Pro valve (23  mm) was prepared per manufacturer's guidelines. The valve was advanced through the introducer sheath using normal technique until in an appropriate position in the abdominal aorta beyond the sheath tip. The device was then advanced around the arch into the ascending aorta and across the aortic valve. The valve was slowly deployed and confirmed to be in a good position. The deployment system was removed. There is felt to be trivial paravalvular leak and trivial central aortic insufficiency.  The patient's hemodynamic recovery following valve deployment is good.  The deployment balloon and guidewire are both  removed. Echo demostrated acceptable post-procedural gradients, stable mitral valve function, and trivial AI.   PROCEDURE COMPLETION:  The sheath was then removed and closure devices were completed. Protamine was administered once femoral arterial repair was complete. The temporary pacemaker, pigtail catheters and femoral sheaths were removed with Mynx closure device placed in the artery. The venous sheath was removed and manual pressure was held.    The patient tolerated the procedure well and is transported to the surgical intensive care in stable condition. There were no immediate intraoperative complications. All sponge instrument and needle counts are verified correct at completion of the operation.   No blood products were administered during the operation.  The patient received a total of 88 mL of intravenous contrast during the procedure.  Lauree Chandler MD 06/06/2018 5:01 PM

## 2018-06-07 ENCOUNTER — Inpatient Hospital Stay (HOSPITAL_COMMUNITY): Payer: Medicare Other

## 2018-06-07 ENCOUNTER — Other Ambulatory Visit: Payer: Self-pay | Admitting: Physician Assistant

## 2018-06-07 ENCOUNTER — Encounter (HOSPITAL_COMMUNITY): Payer: Self-pay | Admitting: Cardiovascular Disease

## 2018-06-07 DIAGNOSIS — Z952 Presence of prosthetic heart valve: Secondary | ICD-10-CM

## 2018-06-07 LAB — CBC
HCT: 34.5 % — ABNORMAL LOW (ref 36.0–46.0)
Hemoglobin: 11.7 g/dL — ABNORMAL LOW (ref 12.0–15.0)
MCH: 32.3 pg (ref 26.0–34.0)
MCHC: 33.9 g/dL (ref 30.0–36.0)
MCV: 95.3 fL (ref 80.0–100.0)
Platelets: 173 10*3/uL (ref 150–400)
RBC: 3.62 MIL/uL — ABNORMAL LOW (ref 3.87–5.11)
RDW: 13.1 % (ref 11.5–15.5)
WBC: 8.9 10*3/uL (ref 4.0–10.5)
nRBC: 0 % (ref 0.0–0.2)

## 2018-06-07 LAB — BASIC METABOLIC PANEL
Anion gap: 10 (ref 5–15)
BUN: 14 mg/dL (ref 8–23)
CO2: 23 mmol/L (ref 22–32)
Calcium: 8.6 mg/dL — ABNORMAL LOW (ref 8.9–10.3)
Chloride: 106 mmol/L (ref 98–111)
Creatinine, Ser: 0.71 mg/dL (ref 0.44–1.00)
GFR calc Af Amer: >60 mL/min (ref >60)
GFR calc non Af Amer: >60 mL/min (ref >60)
Glucose, Bld: 125 mg/dL — ABNORMAL HIGH (ref 70–99)
Potassium: 3.8 mmol/L (ref 3.5–5.1)
Sodium: 139 mmol/L (ref 135–145)

## 2018-06-07 LAB — ECHOCARDIOGRAM LIMITED
Height: 62 in
Weight: 3977.6 oz

## 2018-06-07 LAB — MAGNESIUM: Magnesium: 1.9 mg/dL (ref 1.7–2.4)

## 2018-06-07 MED ORDER — CLOPIDOGREL BISULFATE 75 MG PO TABS: 75.0000 mg | ORAL_TABLET | Freq: Every day | ORAL | 1 refills | Status: DC

## 2018-06-07 MED ORDER — PANTOPRAZOLE SODIUM 40 MG PO TBEC: 40.0000 mg | DELAYED_RELEASE_TABLET | Freq: Every day | ORAL | 1 refills | Status: DC

## 2018-06-07 NOTE — Progress Notes (Signed)
  Echocardiogram 2D Echocardiogram limited has been performed.  Anna Gamble M 06/07/2018, 9:46 AM

## 2018-06-07 NOTE — Progress Notes (Signed)
Inpatient Diabetes Program Recommendations  AACE/ADA: New Consensus Statement on Inpatient Glycemic Control (2015)  Target Ranges:  Prepandial:   less than 140 mg/dL      Peak postprandial:   less than 180 mg/dL (1-2 hours)      Critically ill patients:  140 - 180 mg/dL   Results for REIA, VIERNES (MRN [id number]) as of 06/07/2018 11:09  Ref. Range 07/04/2017 10:15 11/14/2017 11:28 02/27/2018 08:32 06/02/2018 13:47  Hemoglobin A1C Latest Ref Range: 4.8 - 5.6 % 6.9 (H) 6.5 (A) 6.7 (A) 7.1 (H)    Review of Glycemic Control  Diabetes history: None  Inpatient Diabetes Program Recommendations:    Hgb A1c level 7.1%. Previous A1c's consistent with current value within the past year. Unsure if this has been addressed with patient in the past. It seems to be drawn periodically.  Will need PCP follow up outpatient.  Thanks,  Tama Headings RN, MSN, BC-ADM Inpatient Diabetes Coordinator Team Pager [phone] (8a-5p)

## 2018-06-07 NOTE — Progress Notes (Signed)
Cardiac Rehab Advisory Cardiac Rehab Phase I is not seeing pts face to face at this time due to Covid 19 restrictions. Ambulation is occurring through nursing, PT, and mobility teams. We will help facilitate that process as needed. We are calling pts in their rooms and discussing education. We will then deliver education materials to pts RN for delivery to pt.   [id number] Called pt to educate. Pt stated she walked in hall with walker and has cane and walker at home. Did not give ex ed as pt stated she can only walk short distances due to foot issues. Encouraged her to walk as tolerated. Discussed watching carbs as her A1C at 7.1.  Encouraged also to watch sodium in diet. Will give heart healthy diet. Discussed CRP 2 and referred to [facility]. Understanding voiced by pt. Graylon Good RN BSN 06/07/2018 11:34 AM

## 2018-06-07 NOTE — Discharge Summary (Addendum)
[place] VALVE TEAM  Discharge Summary    Patient ID: Anna Gamble MRN: [id number]; DOB: 1935-11-29  Admit date: 06/06/2018 Discharge date: 06/07/2018  Primary Care Provider: Burnis Medin, MD  Primary Cardiologist: [name] / Dr. Angelena Form & Dr. Cyndia Bent (TAVR)  Discharge Diagnoses    Principal Problem:   S/P TAVR (transcatheter aortic valve replacement) Active Problems:   HLD (hyperlipidemia)   Morbid obesity ([place])   Essential hypertension   GERD (gastroesophageal reflux disease)   Severe aortic valve stenosis   Allergies Allergies  Allergen Reactions   Lisinopril Cough   Statins Other (See Comments)    muscle aches with some statins, tolerates low doses of crestor    Diagnostic Studies/Procedures    TAVR OPERATIVE NOTE  Date of Procedure:                06/06/2018  Preoperative Diagnosis:      Severe Aortic Stenosis   Postoperative Diagnosis:    Same   Procedure:        Transcatheter Aortic Valve Replacement - Percutaneous Right Transfemoral Approach             Medtronic Evolut Pro + (size 23 mm,  serial # [id number])              Co-Surgeons:                        Gaye Pollack, MD and Lauree Chandler, MD   Anesthesiologist:                  Suella Broad, MD  Echocardiographer:              Jenkins Rouge, MD  Pre-operative Echo Findings: ? Severe aortic stenosis ? Normal left ventricular systolic function  Post-operative Echo Findings: ? trivial paravalvular leak ? Normal left ventricular systolic function  _____________   Echo 06/07/18: pending   History of Present Illness     Anna Gamble is a 83 y.o. female with a history of morbid obesity, HTN, hyperlipidemia and severe aortic stenosis who presented to [facility] on 06/06/18 for planned TAVR.   She has a history of aortic stenosis that has been followed by [name] with serial echocardiograms. She now has progressively  worsening shortness of breath and fatigue as well as lower extremity swelling. Her most recent echo on 03/24/2018 showed an increase in the mean AV gradient to 46 mm Hg with a peak of 70.2 mm Hg and an AVA of 0.64 cm2. LVEF was normal. Cardiac cath showed mild non-obstructive CAD. The mean gradient was 28.7 with a peak of 39.   The patient has been evaluated by the multidisciplinary valve team and felt to have severe, symptomatic aortic stenosis and to be a suitable candidate for TAVR, which was set up for 06/06/18 .    Hospital Course     Consultants: none  Severe AS: s/p successful TAVR with a 23 mm Medtronic Evolut Pro + via the TF approach on 06/06/18. Post operative echo completed but pending formal read. Groin sites are stable. ECG with NSR and no high grade heart block. Continue ASA and plavix. She still has shortness of breath which is likely related to body habitus and deconditioning. She thinks there has been a slight improvement since TAVR  HTN: Bp well controlled. Resume home meds  Morbid obesity: Body mass index is 45.47 kg/m.  Hopefully, she can  be more active now that she has had TAVR  HLD: continue statin   GERD: omeprazole has been switched to protonix to avoid potential drug drug interaction with plavix.   _____________  Discharge Vitals Blood pressure 137/75, pulse 76, temperature 98.4 F (36.9 C), temperature source Oral, resp. rate (!) 22, height 5\' 2"  (1.575 m), weight 112.8 kg, SpO2 94 %.  Filed Weights   06/06/18 1302 06/07/18 0622  Weight: 112.9 kg 112.8 kg    GEN: Well nourished, well developed, in no acute distress, morbidly obese HEENT: normal Neck: no JVD or masses Cardiac: RRR; soft flow murmur. No rubs, or gallops,no edema  Respiratory:  clear to auscultation bilaterally, normal work of breathing GI: soft, nontender, nondistended, + BS MS: no deformity or atrophy Skin: warm and dry, no rash.  Groin sites clear without hematoma or ecchymosis Neuro:   Alert and Oriented x 3, Strength and sensation are intact.  Psych: euthymic mood, full affect   Labs & Radiologic Studies    CBC Recent Labs    06/06/18 1806 06/07/18 0407  WBC  --  8.9  HGB 11.2* 11.7*  HCT 33.0* 34.5*  MCV  --  95.3  PLT  --  454   Basic Metabolic Panel Recent Labs    06/06/18 1658 06/06/18 1806 06/07/18 0407  NA 137 140 139  K 4.2 4.0 3.8  CL  --   --  106  CO2  --   --  23  GLUCOSE 182*  --  125*  BUN  --   --  14  CREATININE  --  0.70 0.71  CALCIUM  --   --  8.6*  MG  --   --  1.9   Liver Function Tests No results for input(s): AST, ALT, ALKPHOS, BILITOT, PROT, ALBUMIN in the last 72 hours. No results for input(s): LIPASE, AMYLASE in the last 72 hours. Cardiac Enzymes No results for input(s): CKTOTAL, CKMB, CKMBINDEX, TROPONINI in the last 72 hours. BNP Invalid input(s): POCBNP D-Dimer No results for input(s): DDIMER in the last 72 hours. Hemoglobin A1C No results for input(s): HGBA1C in the last 72 hours. Fasting Lipid Panel No results for input(s): CHOL, HDL, LDLCALC, TRIG, CHOLHDL, LDLDIRECT in the last 72 hours. Thyroid Function Tests No results for input(s): TSH, T4TOTAL, T3FREE, THYROIDAB in the last 72 hours.  Invalid input(s): FREET3 _____________  Dg Chest 2 View  Result Date: 06/04/2018 CLINICAL DATA:  Preop EXAM: CHEST - 2 VIEW COMPARISON:  None. FINDINGS: The heart size and mediastinal contours are within normal limits. Both lungs are clear. Disc degenerative disease of the thoracic spine. IMPRESSION: No acute abnormality of the lungs. Electronically Signed   By: Eddie Candle M.D.   On: 06/04/2018 22:36   Ct Coronary Morph W/cta Cor W/score W/ca W/cm &/or Wo/cm  Addendum Date: 05/31/2018   ADDENDUM REPORT: 05/31/2018 16:07 EXAM: OVER-READ INTERPRETATION  CT CHEST The following report is an over-read performed by radiologist Dr. Samara Snide [facility], [place] on 05/31/2018. This over-read does not include interpretation  of cardiac or coronary anatomy or pathology. The cardiac CTA interpretation by the cardiologist is attached. COMPARISON:  11/29/2016 CT angiogram of the chest. FINDINGS: Please see the separate concurrent chest CT angiogram report for details. IMPRESSION: Please see the separate concurrent chest CT angiogram report for details. Electronically Signed   By: Ilona Sorrel M.D.   On: 05/31/2018 16:07   Result Date: 05/31/2018 CLINICAL DATA:  83 year old female with severe aortic stenosis being  evaluated for a TAVR procedure. EXAM: Cardiac TAVR CT TECHNIQUE: The patient was scanned on a [employer]. A 120 kV retrospective scan was triggered in the descending thoracic aorta at 111 HU's. Gantry rotation speed was 250 msecs and collimation was .6 mm. No beta blockade or nitro were given. The 3D data set was reconstructed in 5% intervals of the R-R cycle. Systolic and diastolic phases were analyzed on a dedicated work station using MPR, MIP and VRT modes. The patient received 80 cc of contrast. FINDINGS: Aortic Valve: Trileaflet aortic valve with severely thickened and calcified leaflets, severely restricted leaflet opening and minimal calcifications extending into the LVOT. Aorta: Normal size with mild diffuse atherosclerotic plaque and calcifications and no dissection. Sinotubular Junction: 23 x 22 mm Ascending Thoracic Aorta: 28 x 28 mm Aortic Arch: 25 x 23 mm Descending Thoracic Aorta: 23 x 22 mm Sinus of Valsalva Measurements: Non-coronary: 25 mm Right -coronary: 23 mm Left -coronary: 25 mm Coronary Artery Height above Annulus: Left Main: 14 mm Right Coronary: 12 mm Virtual Basal Annulus Measurements: Maximum/Minimum Diameter: 22.9 x 18.4 mm Mean Diameter: 20.1 mm Perimeter: 65.1 mm Area: 317 mm2 Optimum Fluoroscopic Angle for Delivery: RAO 8 RCA 9. IMPRESSION: 1. Trileaflet aortic valve with severely thickened and calcified leaflets, severely restricted leaflet opening and minimal calcifications extending  into the LVOT. Annular measurements suitable for delivery of a 23 mm Edwards-SAPIEN 3 valve (based on mean diameter). 2. Sufficient coronary to annulus distance. 3. Optimum Fluoroscopic Angle for Delivery:  RAO 8 RCA 9. 4. No thrombus in the left atrial appendage. Electronically Signed: By: Ena Dawley On: 05/31/2018 15:44   Dg Chest Port 1 View  Result Date: 06/06/2018 CLINICAL DATA:  83 year old female status post TAVR. Postoperative day zero. EXAM: PORTABLE CHEST 1 VIEW COMPARISON:  06/02/2018 and earlier. FINDINGS: Portable AP semi upright view at 2016 hours. Stable cardiac size and mediastinal contours. Sequelae of TAVR now demonstrated. Visualized tracheal air column is within normal limits. Mildly lower lung volumes. No pneumothorax, pulmonary edema or pleural effusion. Paucity of bowel gas in the upper abdomen. IMPRESSION: Sequelae of TAVR. Mildly lower lung volumes with no acute cardiopulmonary abnormality. Electronically Signed   By: Genevie Ann M.D.   On: 06/06/2018 20:43   Ct Angio Chest Aorta W &/or Wo Contrast  Result Date: 05/31/2018 CLINICAL DATA:  83 year old female with severe symptomatic aortic stenosis. Dyspnea on exertion. Pre-TAVR evaluation. EXAM: CT ANGIOGRAPHY CHEST, ABDOMEN AND PELVIS TECHNIQUE: Multidetector CT imaging through the chest, abdomen and pelvis was performed using the standard protocol during bolus administration of intravenous contrast. Multiplanar reconstructed images and MIPs were obtained and reviewed to evaluate the vascular anatomy. CONTRAST:  159mL OMNIPAQUE IOHEXOL 350 MG/ML SOLN COMPARISON:  11/29/2016 chest CT angiogram. 03/26/2013 CT abdomen/pelvis. FINDINGS: CTA CHEST FINDINGS Cardiovascular: Borderline mild cardiomegaly. No significant pericardial effusion/thickening. Coarsely calcified and prominently thickened aortic valve. Three-vessel coronary atherosclerosis. Atherosclerotic nonaneurysmal thoracic aorta. Normal caliber pulmonary arteries. No central  pulmonary emboli. Mediastinum/Nodes: No discrete thyroid nodules. Unremarkable esophagus. No pathologically enlarged axillary, mediastinal or hilar lymph nodes. Lungs/Pleura: No pneumothorax. No pleural effusion. No acute consolidative airspace disease, lung masses or significant pulmonary nodules. Musculoskeletal: No aggressive appearing focal osseous lesions. Moderate thoracic spondylosis. CTA ABDOMEN AND PELVIS FINDINGS Hepatobiliary: Normal liver with no liver mass. Cholecystectomy. No biliary ductal dilatation. Pancreas: Normal, with no mass or duct dilation. Spleen: Normal size. No mass. Adrenals/Urinary Tract: Stable coarse calcification in the left adrenal gland compatible with remote insult. No discrete  adrenal nodules. No hydronephrosis. No contour deforming renal masses. Normal bladder. Stomach/Bowel: Normal non-distended stomach. Normal caliber small bowel with no small bowel wall thickening. Marked colonic diverticulosis, most prominent in the sigmoid colon, with no acute large bowel wall thickening or significant pericolonic fat stranding. Vascular/Lymphatic: Atherosclerotic nonaneurysmal abdominal aorta. Patent portal, splenic and renal veins. No pathologically enlarged lymph nodes in the abdomen or pelvis. Reproductive: Grossly normal uterus.  No adnexal mass. Other: No pneumoperitoneum, ascites or focal fluid collection. Small fat containing periumbilical hernia, stable. Musculoskeletal: No aggressive appearing focal osseous lesions. Marked lumbar spondylosis. VASCULAR MEASUREMENTS PERTINENT TO TAVR: AORTA: Minimal Aortic Diameter-10.1 x 9.5 mm Severity of Aortic Calcification-moderate to severe RIGHT PELVIS: Right Common Iliac Artery - Minimal Diameter-8.0 x 7.9 mm Tortuosity-mild Calcification-mild Right External Iliac Artery - Minimal Diameter-5.8 x 5.7 mm Tortuosity-mild Calcification-none Right Common Femoral Artery - Minimal Diameter-6.7 x 5.9 mm Tortuosity-mild Calcification-none LEFT PELVIS:  Left Common Iliac Artery - Minimal Diameter-8.1 x 8.0 mm Tortuosity-mild Calcification-mild Left External Iliac Artery - Minimal Diameter-7.1 x 6.3 mm Tortuosity-mild Calcification-none Left Common Femoral Artery - Minimal Diameter-6.2 x 6.2 mm Tortuosity-mild Calcification-none Review of the MIP images confirms the above findings. IMPRESSION: 1. Vascular findings and measurements pertinent to potential TAVR procedure, as detailed. 2. Marked thickening and calcification of the aortic valve, compatible with the reported history of severe symptomatic aortic stenosis. 3. Three-vessel coronary atherosclerosis. 4. Marked colonic diverticulosis. 5.  Aortic Atherosclerosis (ICD10-I70.0). Electronically Signed   By: Ilona Sorrel M.D.   On: 05/31/2018 18:01   Vas US Carotid  Result Date: 05/31/2018 Carotid Arterial Duplex Study Indications:  Pre TAVR, severe AO stenosis. Risk Factors: Coronary artery disease. Performing Technologist: Oda Cogan RDMS, RVT  Examination Guidelines: A complete evaluation includes B-mode imaging, spectral Doppler, color Doppler, and power Doppler as needed of all accessible portions of each vessel. Bilateral testing is considered an integral part of a complete examination. Limited examinations for reoccurring indications may be performed as noted.  Right Carotid Findings: +----------+--------+--------+--------+------------+--------+             PSV cm/s EDV cm/s Stenosis Describe     Comments  +----------+--------+--------+--------+------------+--------+  CCA Prox   138      31                                       +----------+--------+--------+--------+------------+--------+  CCA Distal 159      24                                       +----------+--------+--------+--------+------------+--------+  ICA Prox   95       28       1-39%    heterogenous           +----------+--------+--------+--------+------------+--------+  ICA Mid    94       31       1-39%                            +----------+--------+--------+--------+------------+--------+  ICA Distal 118      36                                       +----------+--------+--------+--------+------------+--------+  ECA        131      18                                       +----------+--------+--------+--------+------------+--------+ +----------+--------+-------+--------+-------------------+             PSV cm/s EDV cms Describe Arm Pressure (mmHG)  +----------+--------+-------+--------+-------------------+  Subclavian 145                                            +----------+--------+-------+--------+-------------------+ +---------+--------+--+--------+--+  Vertebral PSV cm/s 48 EDV cm/s 13  +---------+--------+--+--------+--+  Left Carotid Findings: +----------+--------+--------+--------+------------+--------+             PSV cm/s EDV cm/s Stenosis Describe     Comments  +----------+--------+--------+--------+------------+--------+  CCA Prox   124      25                                       +----------+--------+--------+--------+------------+--------+  CCA Distal 125      36                                       +----------+--------+--------+--------+------------+--------+  ICA Prox   83       28       1-39%    heterogenous           +----------+--------+--------+--------+------------+--------+  ICA Mid    109      34       1-39%    heterogenous           +----------+--------+--------+--------+------------+--------+  ICA Distal 85       27                                       +----------+--------+--------+--------+------------+--------+  ECA        140      22                                       +----------+--------+--------+--------+------------+--------+ +----------+--------+--------+--------+-------------------+  Subclavian PSV cm/s EDV cm/s Describe Arm Pressure (mmHG)  +----------+--------+--------+--------+-------------------+             177                                              +----------+--------+--------+--------+-------------------+ +---------+--------+--+--------+--+  Vertebral PSV cm/s 67 EDV cm/s 16  +---------+--------+--+--------+--+  Summary: Right Carotid: Velocities in the right ICA are consistent with a 1-39% stenosis. Left Carotid: Velocities in the left ICA are consistent with a 1-39% stenosis. Vertebrals: Bilateral vertebral arteries demonstrate antegrade flow. *See table(s) above for measurements and observations.  Electronically signed by Harold Barban MD on 05/31/2018 at 3:33:51 PM.    Final    Ct Angio Abd/pel W/ And/or W/o  Result Date: 05/31/2018 CLINICAL DATA:  83 year old female with  severe symptomatic aortic stenosis. Dyspnea on exertion. Pre-TAVR evaluation. EXAM: CT ANGIOGRAPHY CHEST, ABDOMEN AND PELVIS TECHNIQUE: Multidetector CT imaging through the chest, abdomen and pelvis was performed using the standard protocol during bolus administration of intravenous contrast. Multiplanar reconstructed images and MIPs were obtained and reviewed to evaluate the vascular anatomy. CONTRAST:  164mL OMNIPAQUE IOHEXOL 350 MG/ML SOLN COMPARISON:  11/29/2016 chest CT angiogram. 03/26/2013 CT abdomen/pelvis. FINDINGS: CTA CHEST FINDINGS Cardiovascular: Borderline mild cardiomegaly. No significant pericardial effusion/thickening. Coarsely calcified and prominently thickened aortic valve. Three-vessel coronary atherosclerosis. Atherosclerotic nonaneurysmal thoracic aorta. Normal caliber pulmonary arteries. No central pulmonary emboli. Mediastinum/Nodes: No discrete thyroid nodules. Unremarkable esophagus. No pathologically enlarged axillary, mediastinal or hilar lymph nodes. Lungs/Pleura: No pneumothorax. No pleural effusion. No acute consolidative airspace disease, lung masses or significant pulmonary nodules. Musculoskeletal: No aggressive appearing focal osseous lesions. Moderate thoracic spondylosis. CTA ABDOMEN AND PELVIS FINDINGS Hepatobiliary: Normal liver with no liver  mass. Cholecystectomy. No biliary ductal dilatation. Pancreas: Normal, with no mass or duct dilation. Spleen: Normal size. No mass. Adrenals/Urinary Tract: Stable coarse calcification in the left adrenal gland compatible with remote insult. No discrete adrenal nodules. No hydronephrosis. No contour deforming renal masses. Normal bladder. Stomach/Bowel: Normal non-distended stomach. Normal caliber small bowel with no small bowel wall thickening. Marked colonic diverticulosis, most prominent in the sigmoid colon, with no acute large bowel wall thickening or significant pericolonic fat stranding. Vascular/Lymphatic: Atherosclerotic nonaneurysmal abdominal aorta. Patent portal, splenic and renal veins. No pathologically enlarged lymph nodes in the abdomen or pelvis. Reproductive: Grossly normal uterus.  No adnexal mass. Other: No pneumoperitoneum, ascites or focal fluid collection. Small fat containing periumbilical hernia, stable. Musculoskeletal: No aggressive appearing focal osseous lesions. Marked lumbar spondylosis. VASCULAR MEASUREMENTS PERTINENT TO TAVR: AORTA: Minimal Aortic Diameter-10.1 x 9.5 mm Severity of Aortic Calcification-moderate to severe RIGHT PELVIS: Right Common Iliac Artery - Minimal Diameter-8.0 x 7.9 mm Tortuosity-mild Calcification-mild Right External Iliac Artery - Minimal Diameter-5.8 x 5.7 mm Tortuosity-mild Calcification-none Right Common Femoral Artery - Minimal Diameter-6.7 x 5.9 mm Tortuosity-mild Calcification-none LEFT PELVIS: Left Common Iliac Artery - Minimal Diameter-8.1 x 8.0 mm Tortuosity-mild Calcification-mild Left External Iliac Artery - Minimal Diameter-7.1 x 6.3 mm Tortuosity-mild Calcification-none Left Common Femoral Artery - Minimal Diameter-6.2 x 6.2 mm Tortuosity-mild Calcification-none Review of the MIP images confirms the above findings. IMPRESSION: 1. Vascular findings and measurements pertinent to potential TAVR procedure, as detailed. 2. Marked thickening and  calcification of the aortic valve, compatible with the reported history of severe symptomatic aortic stenosis. 3. Three-vessel coronary atherosclerosis. 4. Marked colonic diverticulosis. 5.  Aortic Atherosclerosis (ICD10-I70.0). Electronically Signed   By: Ilona Sorrel M.D.   On: 05/31/2018 18:01   Disposition   Pt is being discharged home today in good condition.  Follow-up Plans & Appointments    Follow-up Information    Eileen Stanford, PA-C. Go on 06/14/2018.   Specialties:  Cardiology, Radiology Why:  @ 2:30pm. this is a VIRTUAL visit. please review consent and telehealth information in your discharge instructions. Please take your blood pressure, heart rate and weight prior to this appointment if possible Contact information: [place] [phone]          Discharge Instructions    Amb Referral to Cardiac Rehabilitation   Complete by:  As directed    Diagnosis:  Valve Replacement   Valve:  Aortic Comment - TAVR   After initial evaluation and assessments completed: Virtual Based Care may be provided alone or in conjunction  with Phase 2 Cardiac Rehab based on patient barriers.:  Yes      Discharge Medications   Allergies as of 06/07/2018      Reactions   Lisinopril Cough   Statins Other (See Comments)   muscle aches with some statins, tolerates low doses of crestor      Medication List    STOP taking these medications   ibuprofen 200 MG tablet Commonly known as:  ADVIL   omeprazole-sodium bicarbonate 40-1100 MG capsule Commonly known as:  ZEGERID Replaced by:  pantoprazole 40 MG tablet     TAKE these medications   aspirin 81 MG tablet Take 81 mg by mouth daily.   BENGAY EX Apply 1 application topically daily as needed (back pain).   calcium carbonate 500 MG chewable tablet Commonly known as:  TUMS - dosed in mg elemental calcium Chew 2 tablets by mouth daily as needed for indigestion or heartburn.     cholecalciferol 1000 units tablet Commonly known as:  VITAMIN D Take 1,000 Units by mouth daily.   clopidogrel 75 MG tablet Commonly known as:  PLAVIX Take 1 tablet (75 mg total) by mouth daily with breakfast. Start taking on:  Jun 08, 2018   hydrochlorothiazide 12.5 MG capsule Commonly known as:  MICROZIDE Take 12.5 mg by mouth daily.   losartan 25 MG tablet Commonly known as:  COZAAR Take 50 mg by mouth daily.   metoprolol succinate 50 MG 24 hr tablet Commonly known as:  TOPROL-XL TAKE 1 TABLET BY MOUTH  DAILY   MULTIVITAMINS PO Take 1 tablet by mouth daily.   Nebulizer Devi by Does not apply route.   Omega-3 1000 MG Caps Take 1,000 mg by mouth every morning.   pantoprazole 40 MG tablet Commonly known as:  PROTONIX Take 1 tablet (40 mg total) by mouth daily. Start taking on:  Jun 08, 2018 Replaces:  omeprazole-sodium bicarbonate 40-1100 MG capsule   rosuvastatin 40 MG tablet Commonly known as:  CRESTOR Take 1 tablet (40 mg total) by mouth every other day. What changed:  how much to take   Systane 0.4-0.3 % Gel ophthalmic gel Generic drug:  Polyethyl Glycol-Propyl Glycol Place 1 application into both eyes 2 (two) times daily.   TART CHERRY ADVANCED PO Take 1 capsule by mouth daily.          Outstanding Labs/Studies   none  Duration of Discharge Encounter   Greater than 30 minutes including physician time.  Mable Fill, PA-C 06/07/2018, 12:35 PM [phone]  I have personally seen and examined this patient. I agree with the assessment and plan as outlined above.  She is doing well one day post TAVR. BP stable. She is in sinus. No heart block noted. Dyspnea improved. Echo images are only partially available for review. If echo is ok, will d/c home today on ASA and Plavix.   Lauree Chandler 06/07/2018 12:42 PM

## 2018-06-07 NOTE — Discharge Instructions (Signed)
Your home omeprazole has been switched to protonix to avoid potential drug drug interaction with plavix. When you stop taking plavix you can switch back to omeprazole    ACTIVITY AND EXERCISE  Daily activity and exercise are an important part of your recovery. People recover at different rates depending on their general health and type of valve procedure.  Most people recovering from TAVR feel better relatively quickly   No lifting, pushing, pulling more than 10 pounds (examples to avoid: groceries, vacuuming, gardening, golfing):             - For one week with a procedure through the groin.             - For six weeks for procedures through the chest wall or neck NOTE: You will typically see one of our providers 7-14 days after your procedure to discuss [place] the above activities.      DRIVING  Do not drive for until you are seen for follow up and cleared by a provider. Generally, we ask patient to not drive for 1 week after their procedure.  If you have been told by your doctor in the past that you may not drive, you must talk with him/her before you begin driving again.     DRESSING  Groin site: you may leave the clear dressing over the site for up to one week or until it falls off.     HYGIENE  If you had a femoral (leg) procedure, you may take a shower when you return home. After the shower, pat the site dry. Do NOT use powder, oils or lotions in your groin area until the site has completely healed.  If you had a chest procedure, you may shower when you return home unless specifically instructed not to by your discharging practitioner.             - DO NOT scrub incision; pat dry with a towel             - DO NOT apply any lotions, oils, powders to the incision             - No tub baths / swimming for at least 2 weeks.  If you notice any fevers, chills, increased pain, swelling, bleeding or pus, please contact your doctor.   ADDITIONAL INFORMATION  If you are  going to have an upcoming dental procedure, please contact our office as you will require antibiotics ahead of time to prevent infection on your heart valve.    If you have any questions or concerns you can call the structural heart phone during normal business hours 8am-4pm. If you have an urgent need after hours or weekends please call [phone] to talk to the on call provider for general cardiology. If you have an emergency that requires immediate attention, please call 911.    After TAVR Checklist  Check  Test Description   Follow up appointment in 1-2 weeks  You will see our structural heart physician assistant, Nell Range. Your incision sites will be checked and you will be cleared to drive and resume all normal activities if you are doing well.     1 month echo and follow up  You will have an echo to check on your new heart valve and be seen back in the office by Nell Range. Many times the echo is not read by your appointment time, but Joellen Jersey will call you later that day or the following day  to report your results.   Follow up with your primary cardiologist You will need to be seen by your primary cardiologist in the following 3-6 months after your 1 month appointment in the valve clinic. Often times your Plavix or Aspirin will be discontinued during this time, but this is decided on a case by case basis.    1 year echo and follow up You will have another echo to check on your heart valve after 1 year and be seen back in the office by Nell Range. This your last structural heart visit.   Bacterial endocarditis prophylaxis  You will have to take antibiotics for the rest of your life before all dental procedures (even teeth cleanings) to protect your heart valve. Antibiotics are also required before some surgeries. Please check with your cardiologist before scheduling any surgeries. Also, please make sure to tell us if you have a penicillin allergy as you will require an alternative  antibiotic.       HEART AND VASCULAR CENTER   MULTIDISCIPLINARY HEART VALVE TEAM   YOUR CARDIOLOGY TEAM HAS ARRANGED FOR AN E-VISIT FOR YOUR APPOINTMENT - PLEASE REVIEW IMPORTANT INFORMATION BELOW SEVERAL DAYS PRIOR TO YOUR APPOINTMENT  Due to the recent COVID-19 pandemic, we are transitioning in-person office visits to tele-medicine visits in an effort to decrease unnecessary exposure to our patients, their families, and staff. These visits are billed to your insurance just like a normal visit is. We also encourage you to sign up for MyChart if you have not already done so. You will need a smartphone if possible. For patients that do not have this, we can still complete the visit using a regular telephone but do prefer a smartphone to enable video when possible. You may have a family member that lives with you that can help. If possible, we also ask that you have a blood pressure cuff and scale at home to measure your blood pressure, heart rate and weight prior to your scheduled appointment. Patients with clinical needs that need an in-person evaluation and testing will still be able to come to the office if absolutely necessary. If you have any questions, feel free to call our office.    YOUR PROVIDER WILL BE USING THE FOLLOWING PLATFORM TO COMPLETE YOUR VISIT: [email or web address] All you need is a smart phone. You will receive a text message from the provider through the [email or web address] app. You will follow the prompts and it will take you to a virtual visit with your provider. Please check your blood pressure, heart rate and weight prior to your scheduled appointment.  CONSENT FOR TELE-HEALTH VISIT - PLEASE REVIEW  I hereby voluntarily request, consent and authorize [facility] and its employed or contracted physicians, physician assistants, nurse practitioners or other licensed health care professionals (the Practitioner), to provide me with telemedicine health care services (the Services") as deemed  necessary by the treating Practitioner. I acknowledge and consent to receive the Services by the Practitioner via telemedicine. I understand that the telemedicine visit will involve communicating with the Practitioner through live audiovisual communication technology and the disclosure of certain medical information by electronic transmission. I acknowledge that I have been given the opportunity to request an in-person assessment or other available alternative prior to the telemedicine visit and am voluntarily participating in the telemedicine visit.  I understand that I have the right to withhold or withdraw my consent to the use of telemedicine in the course of my care at any time, without affecting my right to  future care or treatment, and that the Practitioner or I may terminate the telemedicine visit at any time. I understand that I have the right to inspect all information obtained and/or recorded in the course of the telemedicine visit and may receive copies of available information for a reasonable fee.  I understand that some of the potential risks of receiving the Services via telemedicine include:   Delay or interruption in medical evaluation due to technological equipment failure or disruption;  Information transmitted may not be sufficient (e.g. poor resolution of images) to allow for appropriate medical decision making by the Practitioner; and/or   In rare instances, security protocols could fail, causing a breach of personal health information.  Furthermore, I acknowledge that it is my responsibility to provide information about my medical history, conditions and care that is complete and accurate to the best of my ability. I acknowledge that Practitioner's advice, recommendations, and/or decision may be based on factors not within their control, such as incomplete or inaccurate data provided by me or distortions of diagnostic images or specimens that may result from electronic  transmissions. I understand that the practice of medicine is not an exact science and that Practitioner makes no warranties or guarantees regarding treatment outcomes. I acknowledge that I will receive a copy of this consent concurrently upon execution via email to the email address I last provided but may also request a printed copy by calling the office of [facility].    I understand that my insurance will be billed for this visit.   I have read or had this consent read to me.  I understand the contents of this consent, which adequately explains the benefits and risks of the Services being provided via telemedicine.   I have been provided ample opportunity to ask questions regarding this consent and the Services and have had my questions answered to my satisfaction.  I give my informed consent for the services to be provided through the use of telemedicine in my medical care  By participating in this telemedicine visit I agree to the above.

## 2018-06-07 NOTE — Anesthesia Postprocedure Evaluation (Signed)
Anesthesia Post Note  Patient: Anna Gamble  Procedure(s) Performed: TRANSCATHETER AORTIC VALVE REPLACEMENT, TRANSFEMORAL (N/A Chest) TRANSESOPHAGEAL ECHOCARDIOGRAM (TEE) (N/A )     Patient location during evaluation: Other Anesthesia Type: General Level of consciousness: awake and alert Pain management: pain level controlled Vital Signs Assessment: post-procedure vital signs reviewed and stable Respiratory status: spontaneous breathing, nonlabored ventilation, respiratory function stable and patient connected to nasal cannula oxygen Cardiovascular status: blood pressure returned to baseline and stable Postop Assessment: no apparent nausea or vomiting Anesthetic complications: no    Last Vitals:  Vitals:   06/07/18 0400 06/07/18 0741  BP: (!) 106/58 (!) 117/55  Pulse: 70 73  Resp: 16 15  Temp: 36.7 C 36.6 C  SpO2: 94% 97%    Last Pain:  Vitals:   06/07/18 0741  TempSrc: Oral  PainSc:                  [name]

## 2018-06-07 NOTE — Progress Notes (Signed)
Pt walked 100 feet with front wheel walker on 2l o2. Pt got really short of breath and wanted to get back. HR went up to 112. Pt was better after she was back in bed. Bilateral groin dressing taken off, band-aid applied.bilateral groin level 0. Will continue to monitor.

## 2018-06-07 NOTE — Progress Notes (Signed)
Pt Received from recovery, AO x4. Still very Sleepy. Pt will be on bed rest till 2130. Denies any pain or discomfort. Breathing even and unlabored in 2l o2 via [place]. Bilateral groin dressing CDI, Level zero. Bilateral DP pulses 2+. CHG bath completed, connected to monitor. CCMD notified. NS running at 50 cc/hr via left Forearm PIV. oriented pt to room and call bell system. Bed alarm on. Will continue to monitor.

## 2018-06-08 ENCOUNTER — Telehealth: Payer: Self-pay | Admitting: Physician Assistant

## 2018-06-08 MED FILL — Magnesium Sulfate Inj 50%: INTRAMUSCULAR | Qty: 10 | Status: AC

## 2018-06-08 MED FILL — Potassium Chloride Inj 2 mEq/ML: INTRAVENOUS | Qty: 40 | Status: AC

## 2018-06-08 MED FILL — Heparin Sodium (Porcine) Inj 1000 Unit/ML: INTRAMUSCULAR | Qty: 30 | Status: AC

## 2018-06-08 NOTE — Telephone Encounter (Signed)
  [place] VALVE TEAM   Patient contacted regarding discharge from [facility] on 06/07/18  Patient understands to follow up with provider Nell Range on 06/14/18 Patient understands discharge instructions? yes Patient understands medications and regimen? yes Patient understands to bring all medications to this visit? yes  Angelena Form PA-C  [facility]

## 2018-06-14 ENCOUNTER — Encounter: Payer: Self-pay | Admitting: Physician Assistant

## 2018-06-14 ENCOUNTER — Other Ambulatory Visit: Payer: Self-pay

## 2018-06-14 ENCOUNTER — Telehealth (INDEPENDENT_AMBULATORY_CARE_PROVIDER_SITE_OTHER): Payer: Medicare Other | Admitting: Physician Assistant

## 2018-06-14 VITALS — BP 140/80 | HR 72 | Temp 97.6°F | Resp 25 | Ht 62.0 in | Wt 242.8 lb

## 2018-06-14 DIAGNOSIS — E669 Obesity, unspecified: Secondary | ICD-10-CM | POA: Diagnosis not present

## 2018-06-14 DIAGNOSIS — I350 Nonrheumatic aortic (valve) stenosis: Secondary | ICD-10-CM | POA: Diagnosis not present

## 2018-06-14 DIAGNOSIS — E785 Hyperlipidemia, unspecified: Secondary | ICD-10-CM | POA: Diagnosis not present

## 2018-06-14 DIAGNOSIS — Z6841 Body Mass Index (BMI) 40.0 and over, adult: Secondary | ICD-10-CM

## 2018-06-14 DIAGNOSIS — I10 Essential (primary) hypertension: Secondary | ICD-10-CM | POA: Diagnosis not present

## 2018-06-14 DIAGNOSIS — Z7189 Other specified counseling: Secondary | ICD-10-CM

## 2018-06-14 DIAGNOSIS — Z952 Presence of prosthetic heart valve: Secondary | ICD-10-CM | POA: Diagnosis not present

## 2018-06-14 DIAGNOSIS — E7800 Pure hypercholesterolemia, unspecified: Secondary | ICD-10-CM

## 2018-06-14 MED ORDER — AMOXICILLIN 500 MG PO TABS: ORAL_TABLET | ORAL | 3 refills | Status: DC

## 2018-06-14 NOTE — Progress Notes (Signed)
HEART AND VASCULAR CENTER   MULTIDISCIPLINARY HEART VALVE TEAM     Virtual Visit via Video Note   This visit type was conducted due to national recommendations for restrictions regarding the COVID-19 Pandemic (e.g. social distancing) in an effort to limit this patient's exposure and mitigate transmission in our community.  Due to her co-morbid illnesses, this patient is at least at moderate risk for complications without adequate follow up.  This format is felt to be most appropriate for this patient at this time.  All issues noted in this document were discussed and addressed.  A limited physical exam was performed with this format.  Please refer to the patient's chart for her consent to telehealth for [facility].   Evaluation Performed:  Follow-up visit  Date:  06/14/2018   ID:  Anna Gamble, DOB 06-30-1935, MRN [id number]  Patient Location: Home Provider Location: Office  PCP:  Burnis Medin, MD  Cardiologist: [name] / Dr. Angelena Form & Dr. Cyndia Bent (TAVR)  Chief Complaint:  [facility] s/p TAVR   History of Present Illness:    Anna Gamble is a 83 y.o. female with a history of morbid obesity, HTN, hyperlipidemia and severe aortic stenosis s/p TAVR (06/06/18) who presents for follow up.  The patient does not have symptoms concerning for COVID-19 infection (fever, chills, cough, or new shortness of breath).   She has a history of aortic stenosis that has been followed by [name] with serial echocardiograms. She now has progressively worsening shortness of breath and fatigue as well as lower extremity swelling. Her most recent echo on 03/24/2018 showed an increase in the mean AV gradient to 46 mm Hg with a peak of 70.2 mm Hg and an AVA of 0.64 cm2. LVEF was normal. Cardiac cath showed mild non-obstructive CAD. The mean gradient was 28.7 with a peak of 39.   She underwent successful TAVR with a 23 mm Medtronic Evolut Pro + via the TF approach on 06/06/18. Post operative echo  showed EF 60% with normally functioning TAVR with mean gradient of 15 mm Hg and no PVL. She was discharged on aspirin and plavix.   Today she presents for follow up. No CP or SOB. He has some LE edema that is stable. No orthopnea or PND. Has had a couple dizzy spells but no syncope. No blood in stool or urine. No palpitations. Has been walking inside the house with an improvement in her breathing.   Past Medical History:  Diagnosis Date  . CAD (coronary artery disease) 03/19/2014  . Colon polyp   . Coronary artery disease   . GERD (gastroesophageal reflux disease)   . Hx of colonoscopy with polypectomy 07/14/2012   medoff   . Hyperlipidemia   . Hypertension   . Impaired fasting glucose   . Morbid obesity ([place])   . S/P TAVR (transcatheter aortic valve replacement)   . Severe aortic stenosis    Past Surgical History:  Procedure Laterality Date  . APPENDECTOMY    . CHOLECYSTECTOMY    . FOOT SURGERY     rt  . KNEE SURGERY     rt  . LEFT AND RIGHT HEART CATHETERIZATION WITH CORONARY ANGIOGRAM N/A 10/10/2013   Procedure: LEFT AND RIGHT HEART CATHETERIZATION WITH CORONARY ANGIOGRAM;  Surgeon: Burnell Blanks, MD;  Location: [facility] CATH LAB;  Service: Cardiovascular;  Laterality: N/A;  . RIGHT/LEFT HEART CATH AND CORONARY ANGIOGRAPHY N/A 04/06/2018   Procedure: RIGHT/LEFT HEART CATH AND CORONARY ANGIOGRAPHY;  Surgeon: Lauree Chandler  D, MD;  Location: [facility] CV LAB;  Service: Cardiovascular;  Laterality: N/A;  . TEE WITHOUT CARDIOVERSION N/A 06/06/2018   Procedure: TRANSESOPHAGEAL ECHOCARDIOGRAM (TEE);  Surgeon: Burnell Blanks, MD;  Location: [facility];  Service: Open Heart Surgery;  Laterality: N/A;  . TONSILLECTOMY    . TOTAL KNEE ARTHROPLASTY  02/01/2011   Procedure: TOTAL KNEE ARTHROPLASTY;  Surgeon: Kerin Salen;  Location: [facility];  Service: Orthopedics;  Laterality: Right;  DEPUY/SIGMA  . TRANSCATHETER AORTIC VALVE REPLACEMENT, TRANSFEMORAL N/A 06/06/2018   Procedure:  TRANSCATHETER AORTIC VALVE REPLACEMENT, TRANSFEMORAL;  Surgeon: Burnell Blanks, MD;  Location: [facility];  Service: Open Heart Surgery;  Laterality: N/A;     Current Meds  Medication Sig  . aspirin 81 MG tablet Take 81 mg by mouth daily.  . calcium carbonate (TUMS - DOSED IN MG ELEMENTAL CALCIUM) 500 MG chewable tablet Chew 2 tablets by mouth daily as needed for indigestion or heartburn.  . cholecalciferol (VITAMIN D) 1000 UNITS tablet Take 1,000 Units by mouth daily.    . clopidogrel (PLAVIX) 75 MG tablet Take 1 tablet (75 mg total) by mouth daily with breakfast.  . hydrochlorothiazide (MICROZIDE) 12.5 MG capsule Take 12.5 mg by mouth daily.  Marland Kitchen losartan (COZAAR) 25 MG tablet Take 50 mg by mouth daily.  . Menthol, Topical Analgesic, (BENGAY EX) Apply 1 application topically daily as needed (back pain).  . metoprolol succinate (TOPROL-XL) 50 MG 24 hr tablet TAKE 1 TABLET BY MOUTH  DAILY  . Misc Natural Products (TART CHERRY ADVANCED PO) Take 1 capsule by mouth daily.   . Multiple Vitamin (MULTIVITAMINS PO) Take 1 tablet by mouth daily.   . Omega-3 1000 MG CAPS Take 1,000 mg by mouth every morning.   . pantoprazole (PROTONIX) 40 MG tablet Take 1 tablet (40 mg total) by mouth daily.  Vladimir Faster Glycol-Propyl Glycol (SYSTANE) 0.4-0.3 % GEL ophthalmic gel Place 1 application into both eyes 2 (two) times daily.  . rosuvastatin (CRESTOR) 40 MG tablet Take 1 tablet (40 mg total) by mouth every other day.     Allergies:   Lisinopril and Statins   Social History   Tobacco Use  . Smoking status: Never Smoker  . Smokeless tobacco: Never Used  Substance Use Topics  . Alcohol use: No  . Drug use: No     Family Hx: The patient's family history includes Aortic stenosis in her sister; Arthritis in her sister; Coronary artery disease in some other family members; Hearing loss in her son; Heart Problems in her brother; Heart attack in her brother, father, and mother; Hyperlipidemia in an other  family member; Hypertension in an other family member; Osteoarthritis in her sister; Rheum arthritis in her sister; Sleep apnea in her son; Stroke in her brother and brother.  ROS:   Please see the history of present illness.    All other systems reviewed and are negative.   Prior CV studies:   The following studies were reviewed today:  TAVR OPERATIVE NOTE  Date of Procedure:06/06/2018  Preoperative Diagnosis:Severe Aortic Stenosis   Postoperative Diagnosis:Same   Procedure:   Transcatheter Aortic Valve Replacement - PercutaneousRightTransfemoral Approach Medtronic Evolut Pro +(size43mm, serial # [id number])  Co-Surgeons:Bryan Alveria Apley, MD and Lauree Chandler, MD   Anesthesiologist:Kevin Smith Robert, MD  Echocardiographer:Peter Johnsie Cancel, MD  Pre-operative Echo Findings: ? Severe aortic stenosis ? Normal left ventricular systolic function  Post-operative Echo Findings: ? trivialparavalvular leak ? Normal left ventricular systolic function  _____________   Echo 06/07/18 IMPRESSIONS  1. The left ventricle has normal systolic function, with an ejection fraction of 60-65%. The cavity size was normal. There is mild concentric left ventricular hypertrophy. Left ventricular diastolic Doppler parameters are consistent with impaired  relaxation. Elevated mean left atrial pressure.  2. The right ventricle has normal systolc function. The cavity was normal. There is no increase in right ventricular wall thickness. Right ventricular systolic pressure could not be assessed.  3. No evidence of mitral valve stenosis.  4. Mild stenosis of the aortic valve.  5. - TAVR: A 25mm Medtronic Evolut TAVR bioprosthesis was present and functioning properly. Peak velocity 2.5 ms. Mean gradient 15 mmHg.   Labs/Other Tests and Data Reviewed:    EKG:  No ECG reviewed.  Recent Labs:  06/02/2018: ALT 33; B Natriuretic Peptide 76.9 06/07/2018: BUN 14; Creatinine, Ser 0.71; Hemoglobin 11.7; Magnesium 1.9; Platelets 173; Potassium 3.8; Sodium 139   Recent Lipid Panel Lab Results  Component Value Date/Time   CHOL 201 (H) 07/04/2017 10:15 AM   TRIG 150.0 (H) 07/04/2017 10:15 AM   HDL 69.50 07/04/2017 10:15 AM   CHOLHDL 3 07/04/2017 10:15 AM   LDLCALC 102 (H) 07/04/2017 10:15 AM   LDLDIRECT 89.0 06/15/2016 10:33 AM    Wt Readings from Last 3 Encounters:  06/14/18 242 lb 12.8 oz (110.1 kg)  06/07/18 248 lb 9.6 oz (112.8 kg)  06/02/18 249 lb (112.9 kg)     Objective:    Vital Signs:  BP 140/80 (BP Location: Left Arm, Patient Position: Sitting, Cuff Size: Normal)   Pulse 72   Temp 97.6 F (36.4 C)   Resp (!) 25   Ht 5\' 2"  (1.575 m)   Wt 242 lb 12.8 oz (110.1 kg)   BMI 44.41 kg/m    Well nourished, well developed female in no acute distress. Morbidly obese. 1+ bilateral LE edema.    ASSESSMENT & PLAN:    Severe AS s/p TAVR: she can tell a difference in her breathing since TAVR. Groin sites are stable. No s/s HAVB. SBE prophylaxis discussed; I have RX'd amoxicillin. Continue Aspirin and plavix. I will see her back next month for echo and virtual visit.  HTN: Bp borderline today. No changes made   Morbid obesity: Body mass index is 45.47 kg/m. She is trying to walk more. Weight down a little.  HLD: continue statin   COVID-19 Education: The signs and symptoms of COVID-19 were discussed with the patient and how to seek care for testing (follow up with PCP or arrange E-visit).  The importance of social distancing was discussed today.  Time:   Today, I have spent 20 minutes with the patient with telehealth technology discussing the above problems.     Medication Adjustments/Labs and Tests Ordered: Current medicines are reviewed at length with the patient today.  Concerns regarding medicines are outlined above.   Tests Ordered: No orders of the defined  types were placed in this encounter.   Medication Changes: Meds ordered this encounter  Medications  . amoxicillin (AMOXIL) 500 MG tablet    Sig: Take 4 tablets by mouth one hour prior to dental appointments    Dispense:  4 tablet    Refill:  3    Please keep on file.  Pt will call when she needs filled.    Disposition:  Follow up in 1 month(s)  Signed, Angelena Form, PA-C  06/14/2018 5:05 PM    [place]

## 2018-06-14 NOTE — Patient Instructions (Signed)
Medication Instructions:  Your physician recommends that you continue on your current medications as directed. Please refer to the Current Medication list given to you today.  If you need a refill on your cardiac medications before your next appointment, please call your pharmacy.   Lab work: none If you have labs (blood work) drawn today and your tests are completely normal, you will receive your results only by: Marland Kitchen MyChart Message (if you have MyChart) OR . A paper copy in the mail If you have any lab test that is abnormal or we need to change your treatment, we will call you to review the results.  Testing/Procedures: Your physician has requested that you have an echocardiogram. Echocardiography is a painless test that uses sound waves to create images of your heart. It provides your doctor with information about the size and shape of your heart and how well your heart's chambers and valves are working. This procedure takes approximately one hour. There are no restrictions for this procedure.  Scheduled for June 9,2020  Follow-Up:  You have a virtual office visit with K. Grandville Silos, [place] on June 10,2020  Any Other Special Instructions Will Be Listed Below (If Applicable). Your physician discussed the importance of taking an antibiotic prior to any dental, gastrointestinal, genitourinary procedures to prevent damage to the heart valves from infection. You were given a prescription for an antibiotic based on current SBE prophylaxis guidelines.  Prescription was sent to your pharmacy.

## 2018-06-15 NOTE — Progress Notes (Signed)
Awesome! Thanks

## 2018-06-26 ENCOUNTER — Telehealth (HOSPITAL_COMMUNITY): Payer: Self-pay | Admitting: *Deleted

## 2018-06-26 NOTE — Telephone Encounter (Signed)
Tried both numbers, never clicks over to a Voicemail.  Will attempt again.  Anna Gamble

## 2018-06-27 ENCOUNTER — Telehealth (HOSPITAL_COMMUNITY): Payer: Self-pay | Admitting: *Deleted

## 2018-06-27 ENCOUNTER — Ambulatory Visit (HOSPITAL_COMMUNITY): Payer: Medicare Other | Attending: Cardiovascular Disease

## 2018-06-27 ENCOUNTER — Other Ambulatory Visit: Payer: Self-pay

## 2018-06-27 DIAGNOSIS — Z952 Presence of prosthetic heart valve: Secondary | ICD-10-CM | POA: Insufficient documentation

## 2018-06-27 NOTE — Telephone Encounter (Signed)
Called and spoke to pt regarding referral for cardiac rehab. Advised pt on virtual cardiac rehab.  Pt is not interested and does not have the technology needed to do so.  Pt complains that she does not feel wel and her heart feels like it is speeding up then slowing down.  Pt thinks she may need a pacemaker.  Will route this note to Nell Range for follow up.  Will continue to follow. Cherre Huger, BSN Cardiac and Training and development officer

## 2018-06-27 NOTE — Progress Notes (Signed)
[facility]   MULTIDISCIPLINARY HEART VALVE TEAM     Virtual Visit via Telephone Note   This visit type was conducted due to national recommendations for restrictions regarding the COVID-19 Pandemic (e.g. social distancing) in an effort to limit this patient's exposure and mitigate transmission in our community.  Due to her co-morbid illnesses, this patient is at least at moderate risk for complications without adequate follow up.  This format is felt to be most appropriate for this patient at this time.  The patient did not have access to video technology/had technical difficulties with video requiring transitioning to audio format only (telephone).  All issues noted in this document were discussed and addressed.  No physical exam could be performed with this format.  Please refer to the patient's chart for her  consent to telehealth for [facility].   Evaluation Performed:  Follow-up visit  Date:  06/28/2018   ID:  Anna Gamble, DOB 1935-05-19, MRN [id number]  Patient Location: Home Provider Location: Office  PCP:  Burnis Medin, MD  Cardiologist: [name] Dr. Angelena Form & Dr. Cyndia Bent (TAVR)   Chief Complaint:  1 month s/p TAVR  History of Present Illness:    Anna Gamble is a 83 y.o. female with a history of morbid obesity,HTN, hyperlipidemia andsevereaortic stenosiss/p TAVR (06/06/18) who presents for follow up.  The patient does not have symptoms concerning for COVID-19 infection (fever, chills, cough, or new shortness of breath).   She has a history of aortic stenosisthat has been followed by [name] with serial echocardiograms. She now has progressively worsening shortness of breath and fatigue as well as lower extremity swelling. Her most recent echo on 03/24/2018 showed an increase in the mean AV gradient to 46 mm Hg with a peak of 70.2 mm Hg and an AVA of 0.64 cm2. LVEF was normal. Cardiac cath showed mild non-obstructive CAD. The mean  gradient was 28.7 with a peak of 39.  She underwent successful TAVR with a52mmMedtronic Evolut Pro +via the TF approach on 06/06/18. Post operative echoshowed EF 60% with normally functioning TAVR with mean gradient of 15 mm Hg and no PVL. She was discharged on aspirin and plavix.   Today she presents for follow up. No chest pain. Her shortness of breath had improved but now seems to be worse again. Still has dyspnea on exertion with minimal activity. She mostly limited by back and fot pain. She has had some palpations as well as some weakness and worsening fatigue. No dizziness or syncope. No blood in stool or urine.    Past Medical History:  Diagnosis Date  . CAD (coronary artery disease) 03/19/2014  . Colon polyp   . Coronary artery disease   . GERD (gastroesophageal reflux disease)   . Hx of colonoscopy with polypectomy 07/14/2012   medoff   . Hyperlipidemia   . Hypertension   . Impaired fasting glucose   . Morbid obesity ([place])   . S/P TAVR (transcatheter aortic valve replacement)   . Severe aortic stenosis    Past Surgical History:  Procedure Laterality Date  . APPENDECTOMY    . CHOLECYSTECTOMY    . FOOT SURGERY     rt  . KNEE SURGERY     rt  . LEFT AND RIGHT HEART CATHETERIZATION WITH CORONARY ANGIOGRAM N/A 10/10/2013   Procedure: LEFT AND RIGHT HEART CATHETERIZATION WITH CORONARY ANGIOGRAM;  Surgeon: Burnell Blanks, MD;  Location: [facility] CATH LAB;  Service: Cardiovascular;  Laterality: N/A;  .  RIGHT/LEFT HEART CATH AND CORONARY ANGIOGRAPHY N/A 04/06/2018   Procedure: RIGHT/LEFT HEART CATH AND CORONARY ANGIOGRAPHY;  Surgeon: Burnell Blanks, MD;  Location: [place] CV LAB;  Service: Cardiovascular;  Laterality: N/A;  . TEE WITHOUT CARDIOVERSION N/A 06/06/2018   Procedure: TRANSESOPHAGEAL ECHOCARDIOGRAM (TEE);  Surgeon: Burnell Blanks, MD;  Location: [place];  Service: Open Heart Surgery;  Laterality: N/A;  . TONSILLECTOMY    . TOTAL KNEE ARTHROPLASTY   02/01/2011   Procedure: TOTAL KNEE ARTHROPLASTY;  Surgeon: Kerin Salen;  Location: [place];  Service: Orthopedics;  Laterality: Right;  DEPUY/SIGMA  . TRANSCATHETER AORTIC VALVE REPLACEMENT, TRANSFEMORAL N/A 06/06/2018   Procedure: TRANSCATHETER AORTIC VALVE REPLACEMENT, TRANSFEMORAL;  Surgeon: Burnell Blanks, MD;  Location: [place];  Service: Open Heart Surgery;  Laterality: N/A;     Current Meds  Medication Sig  . amoxicillin (AMOXIL) 500 MG tablet Take 4 tablets by mouth one hour prior to dental appointments  . aspirin 81 MG tablet Take 81 mg by mouth daily.  . calcium carbonate (TUMS - DOSED IN MG ELEMENTAL CALCIUM) 500 MG chewable tablet Chew 2 tablets by mouth daily as needed for indigestion or heartburn.  . cholecalciferol (VITAMIN D) 1000 UNITS tablet Take 1,000 Units by mouth daily.    . clopidogrel (PLAVIX) 75 MG tablet Take 1 tablet (75 mg total) by mouth daily with breakfast.  . hydrochlorothiazide (MICROZIDE) 12.5 MG capsule Take 12.5 mg by mouth daily.  Marland Kitchen losartan (COZAAR) 25 MG tablet Take 50 mg by mouth daily.  . Menthol, Topical Analgesic, (BENGAY EX) Apply 1 application topically daily as needed (back pain).  . metoprolol succinate (TOPROL-XL) 50 MG 24 hr tablet TAKE 1 TABLET BY MOUTH  DAILY  . Misc Natural Products (TART CHERRY ADVANCED PO) Take 1 capsule by mouth daily.   . Multiple Vitamin (MULTIVITAMINS PO) Take 1 tablet by mouth daily.   . Omega-3 1000 MG CAPS Take 1,000 mg by mouth every morning.   . pantoprazole (PROTONIX) 40 MG tablet Take 1 tablet (40 mg total) by mouth daily.  Vladimir Faster Glycol-Propyl Glycol (SYSTANE) 0.4-0.3 % GEL ophthalmic gel Place 1 application into both eyes 2 (two) times daily.  . rosuvastatin (CRESTOR) 40 MG tablet Take 1 tablet (40 mg total) by mouth every other day.     Allergies:   Lisinopril and Statins   Social History   Tobacco Use  . Smoking status: Never Smoker  . Smokeless tobacco: Never Used  Substance Use Topics  .  Alcohol use: No  . Drug use: No     Family Hx: The patient's family history includes Aortic stenosis in her sister; Arthritis in her sister; Coronary artery disease in some other family members; Hearing loss in her son; Heart Problems in her brother; Heart attack in her brother, father, and mother; Hyperlipidemia in an other family member; Hypertension in an other family member; Osteoarthritis in her sister; Rheum arthritis in her sister; Sleep apnea in her son; Stroke in her brother and brother.  ROS:   Please see the history of present illness.    All other systems reviewed and are negative.   Prior CV studies:   The following studies were reviewed today:  TAVR OPERATIVE NOTE  Date of Procedure:06/06/2018  Preoperative Diagnosis:Severe Aortic Stenosis   Postoperative Diagnosis:Same   Procedure:   Transcatheter Aortic Valve Replacement - PercutaneousRightTransfemoral Approach Medtronic Evolut Pro +(size3mm, serial # [id number])  Co-Surgeons:Bryan Alveria Apley, MD and Lauree Chandler, MD   Anesthesiologist:Kevin Smith Robert,  MD  Echocardiographer:Peter Johnsie Cancel, MD  Pre-operative Echo Findings: ? Severe aortic stenosis ? Normal left ventricular systolic function  Post-operative Echo Findings: ? trivialparavalvular leak ? Normal left ventricular systolic function  _____________   Echo 06/07/18 IMPRESSIONS 1. The left ventricle has normal systolic function, with an ejection fraction of 60-65%. The cavity size was normal. There is mild concentric left ventricular hypertrophy. Left ventricular diastolic Doppler parameters are consistent with impaired  relaxation. Elevated mean left atrial pressure. 2. The right ventricle has normal systolc function. The cavity was normal. There is no increase in right ventricular wall thickness. Right ventricular systolic  pressure could not be assessed. 3. No evidence of mitral valve stenosis. 4. Mild stenosis of the aortic valve. 5. - TAVR: A 27mm Medtronic Evolut TAVR bioprosthesis was present and functioning properly. Peak velocity 2.5 ms. Mean gradient 15 mmHg.   _____________   Echo 06/27/18 IMPRESSIONS  1. The left ventricle has normal systolic function with an ejection fraction of 60-65%. The cavity size was normal. There is mild concentric left ventricular hypertrophy. Left ventricular diastolic Doppler parameters are consistent with pseudonormalization. Elevated mean left atrial pressure No evidence of left ventricular regional wall motion abnormalities.  2. The right ventricle has normal systolic function. The cavity was normal. There is no increase in right ventricular wall thickness. Right ventricular systolic pressure is normal with an estimated pressure of 35.0 mmHg.  3. Left atrial size was moderately dilated.  4. A 61mm CoreValve-EvolutR bioprosthetic aortic valve (TAVR) valve is present in the aortic position. Peak and mean gradients are 27 and 14 mm Hg, respectively.  5. The aortic root and ascending aorta are normal in size and structure.  6. When compared to the prior study: no change in TAVR gradients since 06/07/2018.   Labs/Other Tests and Data Reviewed:    EKG:  No ECG reviewed.  Recent Labs: 06/02/2018: ALT 33; B Natriuretic Peptide 76.9 06/07/2018: BUN 14; Creatinine, Ser 0.71; Hemoglobin 11.7; Magnesium 1.9; Platelets 173; Potassium 3.8; Sodium 139   Recent Lipid Panel Lab Results  Component Value Date/Time   CHOL 201 (H) 07/04/2017 10:15 AM   TRIG 150.0 (H) 07/04/2017 10:15 AM   HDL 69.50 07/04/2017 10:15 AM   CHOLHDL 3 07/04/2017 10:15 AM   LDLCALC 102 (H) 07/04/2017 10:15 AM   LDLDIRECT 89.0 06/15/2016 10:33 AM    Wt Readings from Last 3 Encounters:  06/28/18 239 lb 3.2 oz (108.5 kg)  06/14/18 242 lb 12.8 oz (110.1 kg)  06/07/18 248 lb 9.6 oz (112.8 kg)      Objective:    Vital Signs:  BP 120/80   Pulse (!) 52   Ht 5\' 2"  (1.575 m)   Wt 239 lb 3.2 oz (108.5 kg)   BMI 43.75 kg/m     ASSESSMENT & PLAN:    Severe AS s/p TAVR: was doing better but now feels worse with NYHA class III symptoms. She has had some weakness and fatigue. She says her HRs have been down in the 40s at time. Today HR 50. She also has intermittent palpitations. I reviewed short strips from echo yesterday which appear to be sinus bradycardia. Plan to place a live monitor to rule out HAVB. Echo yesterday showed EF 60%, normally functioning TAVR with mean gradient 14 mmHg. Continue aspirin and plavix. She can stop plavix after 6 months of therapy (11/2018). I will see her back in 1 yaer for repeat echo and follow up.   HTN: BP well controlled today. Continue current  medications.    Morbid obesity:Body mass index is 43.75 kg/m. Counseled on the importance of diet and exercise.   HLD: continue statin   Palpitations: she has also felt her heart beating which makes her cough, ? PVCs. Will place monitor to rule out HAVB and assess her palpitations.   COVID-19 Education: The signs and symptoms of COVID-19 were discussed with the patient and how to seek care for testing (follow up with PCP or arrange E-visit).  The importance of social distancing was discussed today.  Time:   Today, I have spent 15 minutes with the patient with telehealth technology discussing the above problems.     Medication Adjustments/Labs and Tests Ordered: Current medicines are reviewed at length with the patient today.  Concerns regarding medicines are outlined above.   Tests Ordered: Orders Placed This Encounter  Procedures  . LONG TERM MONITOR-LIVE TELEMETRY (3-14 DAYS)  . ECHOCARDIOGRAM COMPLETE    Medication Changes: No orders of the defined types were placed in this encounter.   Disposition:  Follow up in 1 year(s)  Signed, Angelena Form, PA-C  06/28/2018 5:18 PM    [facility]

## 2018-06-28 ENCOUNTER — Telehealth (INDEPENDENT_AMBULATORY_CARE_PROVIDER_SITE_OTHER): Payer: Medicare Other | Admitting: Physician Assistant

## 2018-06-28 ENCOUNTER — Encounter: Payer: Self-pay | Admitting: Physician Assistant

## 2018-06-28 VITALS — BP 120/80 | HR 52 | Ht 62.0 in | Wt 239.2 lb

## 2018-06-28 DIAGNOSIS — Z952 Presence of prosthetic heart valve: Secondary | ICD-10-CM

## 2018-06-28 DIAGNOSIS — Z7189 Other specified counseling: Secondary | ICD-10-CM

## 2018-06-28 DIAGNOSIS — E7800 Pure hypercholesterolemia, unspecified: Secondary | ICD-10-CM

## 2018-06-28 DIAGNOSIS — R001 Bradycardia, unspecified: Secondary | ICD-10-CM

## 2018-06-28 DIAGNOSIS — I10 Essential (primary) hypertension: Secondary | ICD-10-CM

## 2018-06-28 DIAGNOSIS — R002 Palpitations: Secondary | ICD-10-CM

## 2018-06-28 NOTE — Patient Instructions (Addendum)
Medication Instructions:  You may STOP PLAVIX 12/07/2018 (this should be about the time your refill runs out)  Labwork: None  Testing/Procedures: Joellen Jersey recommends you wear a HEART MONITOR. This will be mailed to your home and you will be given instruction.  Follow-Up: You have an appointment with [name] scheduled October 02, 2018 at 2:20PM.  You are scheduled for your 1 year TAVR visits on Jun 07, 2019. Please arrive by 2:15PM for your office visit with Nell Range and echocardiogram.

## 2018-06-29 ENCOUNTER — Telehealth: Payer: Self-pay | Admitting: Radiology

## 2018-06-29 NOTE — Telephone Encounter (Signed)
Enrolled patient for 14 Day Zio Telemetry monitor to be mailed. Brief instructions were gone over with the patient and she knows to expet the monitor to arrive in 3-4 days

## 2018-06-29 NOTE — Telephone Encounter (Signed)
Tried calling to verify patients address/insurance to have monitor mailed. Also want to go over brief instructions with her. Will try again later

## 2018-07-03 ENCOUNTER — Ambulatory Visit (INDEPENDENT_AMBULATORY_CARE_PROVIDER_SITE_OTHER): Payer: Medicare Other

## 2018-07-03 DIAGNOSIS — R001 Bradycardia, unspecified: Secondary | ICD-10-CM | POA: Diagnosis not present

## 2018-07-03 DIAGNOSIS — R002 Palpitations: Secondary | ICD-10-CM | POA: Diagnosis not present

## 2018-07-07 ENCOUNTER — Encounter: Payer: Self-pay | Admitting: Thoracic Surgery (Cardiothoracic Vascular Surgery)

## 2018-07-07 ENCOUNTER — Ambulatory Visit: Payer: Medicare Other | Admitting: Cardiology

## 2018-07-13 ENCOUNTER — Telehealth (HOSPITAL_COMMUNITY): Payer: Self-pay

## 2018-07-13 NOTE — Telephone Encounter (Signed)
Pt insurance is active and benefits verified through Medicare A/B. Co-pay $0.00, DED $198.00/$198.00 met, out of pocket $0.00/$0.00 met, co-insurance 20%. No pre-authorization required. Passport, 07/13/2018 @ 12:07PM , REF# [id number]  2ndary insurance is active and benefits verified through [place]. Co-pay $0.00, DED $0.00/$0.00 met, out of pocket $0.00/$0.00 met, co-insurance 0%. No pre-authorization required. Passport, 07/13/2018 @ 12:11PM, REF# [id number]

## 2018-07-25 ENCOUNTER — Other Ambulatory Visit: Payer: Self-pay

## 2018-07-26 ENCOUNTER — Other Ambulatory Visit: Payer: Self-pay | Admitting: Cardiology

## 2018-07-28 ENCOUNTER — Other Ambulatory Visit: Payer: Self-pay

## 2018-07-28 ENCOUNTER — Ambulatory Visit
Admission: RE | Admit: 2018-07-28 | Discharge: 2018-07-28 | Disposition: A | Discharge status: Home / Self Care | Payer: Medicare Other | Source: Ambulatory Visit | Attending: Internal Medicine | Admitting: Internal Medicine

## 2018-07-28 DIAGNOSIS — Z780 Asymptomatic menopausal state: Secondary | ICD-10-CM | POA: Diagnosis not present

## 2018-07-28 DIAGNOSIS — E2839 Other primary ovarian failure: Secondary | ICD-10-CM

## 2018-07-28 DIAGNOSIS — M8589 Other specified disorders of bone density and structure, multiple sites: Secondary | ICD-10-CM | POA: Diagnosis not present

## 2018-09-12 DIAGNOSIS — M25562 Pain in left knee: Secondary | ICD-10-CM | POA: Diagnosis not present

## 2018-09-21 NOTE — Progress Notes (Deleted)
No chief complaint on file.   HPI: Anna Gamble 83 y.o. come in for Chronic disease management  ROS: See pertinent positives and negatives per HPI.  Past Medical History:  Diagnosis Date  . CAD (coronary artery disease) 03/19/2014  . Colon polyp   . Coronary artery disease   . GERD (gastroesophageal reflux disease)   . Hx of colonoscopy with polypectomy 07/14/2012   medoff   . Hyperlipidemia   . Hypertension   . Impaired fasting glucose   . Morbid obesity ([place])   . S/P TAVR (transcatheter aortic valve replacement)   . Severe aortic stenosis     Family History  Problem Relation Age of Onset  . Coronary artery disease Other        female- 1st degree relative  . Coronary artery disease Other        female- 1st degree relative  . Hypertension Other   . Hyperlipidemia Other   . Heart attack Mother        age 2's  . Heart attack Father        age 33's  . Stroke Brother        died   . Hearing loss Son   . Sleep apnea Son   . Aortic stenosis Sister   . Stroke Brother   . Heart attack Brother   . Rheum arthritis Sister   . Osteoarthritis Sister   . Arthritis Sister   . Heart Problems Brother     Social History   Socioeconomic History  . Marital status: Married    Spouse name: Not on file  . Number of children: 4  . Years of education: Not on file  . Highest education level: Not on file  Occupational History  . Occupation: Retired    Comment: Takes care of [employer]  . Occupation: Therapist, sports, peds, cardiology    Comment: retired  Scientific laboratory technician  . Financial resource strain: Not hard at all  . Food insecurity    Worry: Never true    Inability: Never true  . Transportation needs    Medical: No    Non-medical: No  Tobacco Use  . Smoking status: Never Smoker  . Smokeless tobacco: Never Used  Substance and Sexual Activity  . Alcohol use: No  . Drug use: No  . Sexual activity: Not Currently    Comment: quit 40 yrs ago  Lifestyle  . Physical  activity    Days per week: 0 days    Minutes per session: 0 min  . Stress: Only a little  Relationships  . Social connections    Talks on phone: More than three times a week    Gets together: Once a week    Attends religious service: More than 4 times per year    Active member of club or organization: Yes    Attends meetings of clubs or organizations: More than 4 times per year    Relationship status: Married  Other Topics Concern  . Not on file  Social History Narrative   hh of 3 -4  Married   Oval son   At home    Invalid son paraplegia and husband    And grandson recently     No pets   Fluctuating hh membors she cooks and prepares for them           Outpatient Medications Prior to Visit  Medication Sig Dispense Refill  . amoxicillin (AMOXIL) 500 MG tablet Take 4 tablets  by mouth one hour prior to dental appointments 4 tablet 3  . aspirin 81 MG tablet Take 81 mg by mouth daily.    . calcium carbonate (TUMS - DOSED IN MG ELEMENTAL CALCIUM) 500 MG chewable tablet Chew 2 tablets by mouth daily as needed for indigestion or heartburn.    . cholecalciferol (VITAMIN D) 1000 UNITS tablet Take 1,000 Units by mouth daily.      . clopidogrel (PLAVIX) 75 MG tablet Take 1 tablet (75 mg total) by mouth daily with breakfast. 90 tablet 1  . hydrochlorothiazide (MICROZIDE) 12.5 MG capsule TAKE 1 CAPSULE BY MOUTH  DAILY 90 capsule 3  . losartan (COZAAR) 25 MG tablet TAKE 2 TABLETS BY MOUTH  DAILY 180 tablet 3  . Menthol, Topical Analgesic, (BENGAY EX) Apply 1 application topically daily as needed (back pain).    . metoprolol succinate (TOPROL-XL) 50 MG 24 hr tablet TAKE 1 TABLET BY MOUTH  DAILY 90 tablet 1  . Misc Natural Products (TART CHERRY ADVANCED PO) Take 1 capsule by mouth daily.     . Multiple Vitamin (MULTIVITAMINS PO) Take 1 tablet by mouth daily.     . Omega-3 1000 MG CAPS Take 1,000 mg by mouth every morning.     . pantoprazole (PROTONIX) 40 MG tablet Take 1 tablet (40 mg total) by  mouth daily. 90 tablet 1  . Polyethyl Glycol-Propyl Glycol (SYSTANE) 0.4-0.3 % GEL ophthalmic gel Place 1 application into both eyes 2 (two) times daily.    . rosuvastatin (CRESTOR) 40 MG tablet Take 1 tablet (40 mg total) by mouth every other day. 90 tablet 1   No facility-administered medications prior to visit.      EXAM:  There were no vitals taken for this visit.  There is no height or weight on file to calculate BMI.  GENERAL: vitals reviewed and listed above, alert, oriented, appears well hydrated and in no acute distress HEENT: atraumatic, conjunctiva  clear, no obvious abnormalities on inspection of external nose and ears OP : no lesion edema or exudate  NECK: no obvious masses on inspection palpation  LUNGS: clear to auscultation bilaterally, no wheezes, rales or rhonchi, good air movement CV: HRRR, no clubbing cyanosis or  peripheral edema nl cap refill  MS: moves all extremities without noticeable focal  abnormality PSYCH: pleasant and cooperative, no obvious depression or anxiety Lab Results  Component Value Date   WBC 8.9 06/07/2018   HGB 11.7 (L) 06/07/2018   HCT 34.5 (L) 06/07/2018   PLT 173 06/07/2018   GLUCOSE 125 (H) 06/07/2018   CHOL 201 (H) 07/04/2017   TRIG 150.0 (H) 07/04/2017   HDL 69.50 07/04/2017   LDLDIRECT 89.0 06/15/2016   LDLCALC 102 (H) 07/04/2017   ALT 33 06/02/2018   AST 27 06/02/2018   NA 139 06/07/2018   K 3.8 06/07/2018   CL 106 06/07/2018   CREATININE 0.71 06/07/2018   BUN 14 06/07/2018   CO2 23 06/07/2018   TSH 1.05 04/16/2015   INR 1.0 06/02/2018   HGBA1C 7.1 (H) 06/02/2018   BP Readings from Last 3 Encounters:  06/28/18 120/80  06/14/18 140/80  06/07/18 137/75    ASSESSMENT AND PLAN:  Discussed the following assessment and plan:  Medication management  Fasting hyperglycemia early dm by a1c   Morbid obesity (HCC)  Severe aortic valve stenosis  Essential hypertension Dm do a1c  Lipids  Anemia  -Patient advised to  return or notify health care team  if  new concerns arise.  There are no Patient Instructions on file for this visit.   [name]. Inri Sobieski M.D.

## 2018-09-22 ENCOUNTER — Other Ambulatory Visit: Payer: Medicare Other

## 2018-09-26 ENCOUNTER — Encounter: Payer: Medicare Other | Admitting: Internal Medicine

## 2018-09-26 DIAGNOSIS — Z1231 Encounter for screening mammogram for malignant neoplasm of breast: Secondary | ICD-10-CM | POA: Diagnosis not present

## 2018-09-26 DIAGNOSIS — Z803 Family history of malignant neoplasm of breast: Secondary | ICD-10-CM | POA: Diagnosis not present

## 2018-09-26 LAB — HM MAMMOGRAPHY

## 2018-10-02 ENCOUNTER — Other Ambulatory Visit: Payer: Self-pay

## 2018-10-02 ENCOUNTER — Ambulatory Visit (INDEPENDENT_AMBULATORY_CARE_PROVIDER_SITE_OTHER): Payer: Medicare Other | Admitting: Cardiology

## 2018-10-02 ENCOUNTER — Encounter: Payer: Self-pay | Admitting: Cardiology

## 2018-10-02 VITALS — BP 124/54 | HR 66 | Temp 96.8°F | Ht 62.0 in | Wt 242.0 lb

## 2018-10-02 DIAGNOSIS — R002 Palpitations: Secondary | ICD-10-CM

## 2018-10-02 DIAGNOSIS — Z952 Presence of prosthetic heart valve: Secondary | ICD-10-CM

## 2018-10-02 DIAGNOSIS — I2510 Atherosclerotic heart disease of native coronary artery without angina pectoris: Secondary | ICD-10-CM | POA: Diagnosis not present

## 2018-10-02 DIAGNOSIS — I10 Essential (primary) hypertension: Secondary | ICD-10-CM | POA: Diagnosis not present

## 2018-10-02 DIAGNOSIS — E7800 Pure hypercholesterolemia, unspecified: Secondary | ICD-10-CM

## 2018-10-02 NOTE — Progress Notes (Signed)
HPI: FU aortic stenosis/AVR. Placed on beta blocker previously secondary to PVCs.  Found to have severe aortic stenosis on follow-up echocardiogram March 2020.  Cardiac catheterization revealed a 40% proximal right coronary artery and 20% ostial left main.  Carotid Dopplers May 2020 showed 1 to 39% bilateral stenosis.  Had TAVR May 2020.  Last echocardiogram June 2020 showed normal LV systolic function, mild left ventricular hypertrophy, moderate left atrial enlargement, prior TAVR with mean gradient 14 mmHg.  Monitor June 2020 showed sinus rhythm with occasional PAC, PVC, couplet and brief PAT.  Since last seen,she has some dyspnea on exertion but improved compared to prior to TAVR.  No chest pain, palpitations or syncope.  No pedal edema.  Current Outpatient Medications  Medication Sig Dispense Refill  . amoxicillin (AMOXIL) 500 MG tablet Take 4 tablets by mouth one hour prior to dental appointments 4 tablet 3  . aspirin 81 MG tablet Take 81 mg by mouth daily.    . calcium carbonate (TUMS - DOSED IN MG ELEMENTAL CALCIUM) 500 MG chewable tablet Chew 2 tablets by mouth daily as needed for indigestion or heartburn.    . cholecalciferol (VITAMIN D) 1000 UNITS tablet Take 1,000 Units by mouth daily.      . clopidogrel (PLAVIX) 75 MG tablet Take 1 tablet (75 mg total) by mouth daily with breakfast. 90 tablet 1  . hydrochlorothiazide (MICROZIDE) 12.5 MG capsule TAKE 1 CAPSULE BY MOUTH  DAILY 90 capsule 3  . losartan (COZAAR) 25 MG tablet TAKE 2 TABLETS BY MOUTH  DAILY 180 tablet 3  . Menthol, Topical Analgesic, (BENGAY EX) Apply 1 application topically daily as needed (back pain).    . metoprolol succinate (TOPROL-XL) 50 MG 24 hr tablet TAKE 1 TABLET BY MOUTH  DAILY 90 tablet 1  . Misc Natural Products (TART CHERRY ADVANCED PO) Take 1 capsule by mouth daily.     . Multiple Vitamin (MULTIVITAMINS PO) Take 1 tablet by mouth daily.     . Omega-3 1000 MG CAPS Take 1,000 mg by mouth every morning.      . pantoprazole (PROTONIX) 40 MG tablet Take 1 tablet (40 mg total) by mouth daily. 90 tablet 1  . Polyethyl Glycol-Propyl Glycol (SYSTANE) 0.4-0.3 % GEL ophthalmic gel Place 1 application into both eyes 2 (two) times daily.    . rosuvastatin (CRESTOR) 40 MG tablet Take 1 tablet (40 mg total) by mouth every other day. 90 tablet 1   No current facility-administered medications for this visit.      Past Medical History:  Diagnosis Date  . CAD (coronary artery disease) 03/19/2014  . Colon polyp   . Coronary artery disease   . GERD (gastroesophageal reflux disease)   . Hx of colonoscopy with polypectomy 07/14/2012   medoff   . Hyperlipidemia   . Hypertension   . Impaired fasting glucose   . Morbid obesity ([place])   . S/P TAVR (transcatheter aortic valve replacement)   . Severe aortic stenosis     Past Surgical History:  Procedure Laterality Date  . APPENDECTOMY    . CHOLECYSTECTOMY    . FOOT SURGERY     rt  . KNEE SURGERY     rt  . LEFT AND RIGHT HEART CATHETERIZATION WITH CORONARY ANGIOGRAM N/A 10/10/2013   Procedure: LEFT AND RIGHT HEART CATHETERIZATION WITH CORONARY ANGIOGRAM;  Surgeon: Burnell Blanks, MD;  Location: [facility] CATH LAB;  Service: Cardiovascular;  Laterality: N/A;  . RIGHT/LEFT HEART CATH AND CORONARY  ANGIOGRAPHY N/A 04/06/2018   Procedure: RIGHT/LEFT HEART CATH AND CORONARY ANGIOGRAPHY;  Surgeon: Burnell Blanks, MD;  Location: [place] CV LAB;  Service: Cardiovascular;  Laterality: N/A;  . TEE WITHOUT CARDIOVERSION N/A 06/06/2018   Procedure: TRANSESOPHAGEAL ECHOCARDIOGRAM (TEE);  Surgeon: Burnell Blanks, MD;  Location: [place];  Service: Open Heart Surgery;  Laterality: N/A;  . TONSILLECTOMY    . TOTAL KNEE ARTHROPLASTY  02/01/2011   Procedure: TOTAL KNEE ARTHROPLASTY;  Surgeon: Kerin Salen;  Location: [place];  Service: Orthopedics;  Laterality: Right;  DEPUY/SIGMA  . TRANSCATHETER AORTIC VALVE REPLACEMENT, TRANSFEMORAL N/A 06/06/2018    Procedure: TRANSCATHETER AORTIC VALVE REPLACEMENT, TRANSFEMORAL;  Surgeon: Burnell Blanks, MD;  Location: [place];  Service: Open Heart Surgery;  Laterality: N/A;    Social History   Socioeconomic History  . Marital status: Married    Spouse name: Not on file  . Number of children: 4  . Years of education: Not on file  . Highest education level: Not on file  Occupational History  . Occupation: Retired    Comment: Takes care of [employer]  . Occupation: Therapist, sports, peds, cardiology    Comment: retired  Scientific laboratory technician  . Financial resource strain: Not hard at all  . Food insecurity    Worry: Never true    Inability: Never true  . Transportation needs    Medical: No    Non-medical: No  Tobacco Use  . Smoking status: Never Smoker  . Smokeless tobacco: Never Used  Substance and Sexual Activity  . Alcohol use: No  . Drug use: No  . Sexual activity: Not Currently    Comment: quit 40 yrs ago  Lifestyle  . Physical activity    Days per week: 0 days    Minutes per session: 0 min  . Stress: Only a little  Relationships  . Social connections    Talks on phone: More than three times a week    Gets together: Once a week    Attends religious service: More than 4 times per year    Active member of club or organization: Yes    Attends meetings of clubs or organizations: More than 4 times per year    Relationship status: Married  . Intimate partner violence    Fear of current or ex partner: No    Emotionally abused: No    Physically abused: No    Forced sexual activity: No  Other Topics Concern  . Not on file  Social History Narrative   hh of 3 -4  Married   [place] son   At home    Invalid son paraplegia and husband    And grandson recently     No pets   Fluctuating hh membors she cooks and prepares for them           Family History  Problem Relation Age of Onset  . Coronary artery disease Other        female- 1st degree relative  . Coronary artery disease Other         female- 1st degree relative  . Hypertension Other   . Hyperlipidemia Other   . Heart attack Mother        age 36's  . Heart attack Father        age 64's  . Stroke Brother        died   . Hearing loss Son   . Sleep apnea Son   . Aortic stenosis  Sister   . Stroke Brother   . Heart attack Brother   . Rheum arthritis Sister   . Osteoarthritis Sister   . Arthritis Sister   . Heart Problems Brother     ROS: Back pain but no fevers or chills, productive cough, hemoptysis, dysphasia, odynophagia, melena, hematochezia, dysuria, hematuria, rash, seizure activity, orthopnea, PND, pedal edema, claudication. Remaining systems are negative.  Physical Exam: Well-developed obesein no acute distress.  Skin is warm and dry.  HEENT is normal.  Neck is supple.  Chest is clear to auscultation with normal expansion.  Cardiovascular exam is regular rate and rhythm.  2/6 systolic murmur left sternal border.  No diastolic murmur. Abdominal exam nontender or distended. No masses palpated. Extremities show no edema. neuro grossly intact  A/P  1 history of aortic stenosis now status post TAVR-plan to continue SBE prophylaxis.  Most recent echocardiogram showed normally functioning valve.  Continue aspirin.  Discontinue Plavix November 2020.  2 hypertension-patient's blood pressure is controlled.  Continue present medications.  3 mild coronary artery disease-continue medical therapy with aspirin and statin.  No chest pain.  4 hyperlipidemia-continue statin.  5 history of palpitations-continue beta-blocker.  Symptoms have improved.  6 morbid obesity-we discussed the importance of diet, exercise and weight loss.  Kirk Ruths, MD

## 2018-10-02 NOTE — Patient Instructions (Signed)
Medication Instructions:  Stop Plavix in November when RX runs out.  If you need a refill on your cardiac medications before your next appointment, please call your pharmacy.   Follow-Up: At [facility], you and your health needs are our priority.  As part of our continuing mission to provide you with exceptional heart care, we have created designated Provider Care Teams.  These Care Teams include your primary Cardiologist (physician) and Advanced Practice Providers (APPs -  Physician Assistants and Nurse Practitioners) who all work together to provide you with the care you need, when you need it. You will need a follow up appointment in 6 months.  Please call our office 2 months in advance to schedule this appointment.  You may see Dr.Crenshaw or one of the following Advanced Practice Providers on your designated Care Team:   Kerin Ransom, PA-C Roby Lofts, [place] . Sande Rives, PA-C

## 2018-10-04 NOTE — Progress Notes (Signed)
Chief Complaint  Patient presents with  . Annual Exam    Pt has no concerns today   . Medication Management    blood sugar     HPI: Anna Gamble 83 y.o. come in for Chronic disease management  Yearly visit   Breathing  Better   Since valve surgery   Foot problematic .   Shot in knee and ankjle a problem  Foot ankle  Surgery.   Has orthotic s a but brace hurts.  Has pronation and harder to walk bp ok  Vision ok  Hearing coming back  Eating  wome better since husband now diabetic Mood stable  Had to have  gson leave house for behavioral  changes ROS: See pertinent positives and negatives per HPI.  Past Medical History:  Diagnosis Date  . CAD (coronary artery disease) 03/19/2014  . Colon polyp   . Coronary artery disease   . GERD (gastroesophageal reflux disease)   . Hx of colonoscopy with polypectomy 07/14/2012   medoff   . Hyperlipidemia   . Hypertension   . Impaired fasting glucose   . Morbid obesity ([place])   . S/P TAVR (transcatheter aortic valve replacement)   . Severe aortic stenosis     Family History  Problem Relation Age of Onset  . Coronary artery disease Other        female- 1st degree relative  . Coronary artery disease Other        female- 1st degree relative  . Hypertension Other   . Hyperlipidemia Other   . Heart attack Mother        age 67's  . Heart attack Father        age 22's  . Stroke Brother        died   . Hearing loss Son   . Sleep apnea Son   . Aortic stenosis Sister   . Stroke Brother   . Heart attack Brother   . Rheum arthritis Sister   . Osteoarthritis Sister   . Arthritis Sister   . Heart Problems Brother     Social History   Socioeconomic History  . Marital status: Married    Spouse name: Not on file  . Number of children: 4  . Years of education: Not on file  . Highest education level: Not on file  Occupational History  . Occupation: Retired    Comment: Takes care of [employer]  . Occupation: Therapist, sports, peds,  cardiology    Comment: retired  Scientific laboratory technician  . Financial resource strain: Not hard at all  . Food insecurity    Worry: Never true    Inability: Never true  . Transportation needs    Medical: No    Non-medical: No  Tobacco Use  . Smoking status: Never Smoker  . Smokeless tobacco: Never Used  Substance and Sexual Activity  . Alcohol use: No  . Drug use: No  . Sexual activity: Not Currently    Comment: quit 40 yrs ago  Lifestyle  . Physical activity    Days per week: 0 days    Minutes per session: 0 min  . Stress: Only a little  Relationships  . Social connections    Talks on phone: More than three times a week    Gets together: Once a week    Attends religious service: More than 4 times per year    Active member of club or organization: Yes    Attends meetings of clubs  or organizations: More than 4 times per year    Relationship status: Married  Other Topics Concern  . Not on file  Social History Narrative   hh of 3 -4  Married   [place] son   At home    Invalid son paraplegia and husband    And grandson recently     No pets   Fluctuating hh membors she cooks and prepares for them           Outpatient Medications Prior to Visit  Medication Sig Dispense Refill  . amoxicillin (AMOXIL) 500 MG tablet Take 4 tablets by mouth one hour prior to dental appointments 4 tablet 3  . aspirin 81 MG tablet Take 81 mg by mouth daily.    . calcium carbonate (TUMS - DOSED IN MG ELEMENTAL CALCIUM) 500 MG chewable tablet Chew 2 tablets by mouth daily as needed for indigestion or heartburn.    . cholecalciferol (VITAMIN D) 1000 UNITS tablet Take 1,000 Units by mouth daily.      . hydrochlorothiazide (MICROZIDE) 12.5 MG capsule TAKE 1 CAPSULE BY MOUTH  DAILY 90 capsule 3  . losartan (COZAAR) 25 MG tablet TAKE 2 TABLETS BY MOUTH  DAILY 180 tablet 3  . Menthol, Topical Analgesic, (BENGAY EX) Apply 1 application topically daily as needed (back pain).    . metoprolol succinate (TOPROL-XL) 50 MG  24 hr tablet TAKE 1 TABLET BY MOUTH  DAILY 90 tablet 1  . Misc Natural Products (TART CHERRY ADVANCED PO) Take 1 capsule by mouth daily.     . Multiple Vitamin (MULTIVITAMINS PO) Take 1 tablet by mouth daily.     . Omega-3 1000 MG CAPS Take 1,000 mg by mouth every morning.     . pantoprazole (PROTONIX) 40 MG tablet Take 1 tablet (40 mg total) by mouth daily. 90 tablet 1  . Polyethyl Glycol-Propyl Glycol (SYSTANE) 0.4-0.3 % GEL ophthalmic gel Place 1 application into both eyes 2 (two) times daily.    . rosuvastatin (CRESTOR) 40 MG tablet Take 1 tablet (40 mg total) by mouth every other day. 90 tablet 1  . clopidogrel (PLAVIX) 75 MG tablet      No facility-administered medications prior to visit.      EXAM:  BP 130/74 (BP Location: Right Arm, Patient Position: Sitting, Cuff Size: Large)   Pulse 80   Temp 97.6 F (36.4 C) (Temporal)   Ht 5\' 2"  (1.575 m)   Wt 239 lb 9.6 oz (108.7 kg)   SpO2 92%   BMI 43.82 kg/m   Body mass index is 43.82 kg/m.  GENERAL: vitals reviewed and listed above, alert, oriented, appears well hydrated and in no acute distress HEENT: atraumatic, conjunctiva  clear, no obvious abnormalities on inspection of external nose and ears OP : masked  NECK: no obvious masses on inspection palpation  LUNGS: clear to auscultation bilaterally, no wheezes, rales or rhonchi, good air movement CV: HRRR, no clubbing cyanosis or  peripheral edema nl cap refill  MS: moves all extremities left foot ankle  ppronation   collapwsin arch  Taped   Pulse present no lesion  PSYCH: pleasant and cooperative, no obvious depression or anxiety Diabetic Foot Exam - Simple   Simple Foot Form Visual Inspection See comments: Yes Sensation Testing Intact to touch and monofilament testing bilaterally: Yes Pulse Check Posterior Tibialis and Dorsalis pulse intact bilaterally: Yes Comments Left foot abn pronation and arch collapse no lesion     Lab Results  Component Value Date  WBC 5.3  10/06/2018   HGB 13.9 10/06/2018   HCT 41.1 10/06/2018   PLT 248.0 10/06/2018   GLUCOSE 115 (H) 10/06/2018   CHOL 171 10/06/2018   TRIG 164.0 (H) 10/06/2018   HDL 62.50 10/06/2018   LDLDIRECT 89.0 06/15/2016   LDLCALC 76 10/06/2018   ALT 33 06/02/2018   AST 27 06/02/2018   NA 140 10/06/2018   K 4.6 10/06/2018   CL 101 10/06/2018   CREATININE 0.94 10/06/2018   BUN 16 10/06/2018   CO2 31 10/06/2018   TSH 1.05 04/16/2015   INR 1.0 06/02/2018   HGBA1C 7.3 (H) 10/06/2018   MICROALBUR 2.6 (H) 10/06/2018   BP Readings from Last 3 Encounters:  10/06/18 130/74  10/02/18 (!) 124/54  06/28/18 120/80   Fasting today  ASSESSMENT AND PLAN:  Discussed the following assessment and plan:  Diabetes mellitus type 2 in obese ([place]) - Plan: Basic metabolic panel, Hemoglobin A1c, Lipid panel, CBC with Differential/Platelet, Microalbumin / creatinine urine ratio  Need for influenza vaccination - Plan: Flu Vaccine QUAD High Dose(Fluad)  Medication management - Plan: Basic metabolic panel, Hemoglobin A1c, Lipid panel, CBC with Differential/Platelet  Hyperlipidemia, unspecified hyperlipidemia type - Plan: Basic metabolic panel, Hemoglobin A1c, Lipid panel, CBC with Differential/Platelet  Essential hypertension - Plan: Basic metabolic panel, Hemoglobin A1c, Lipid panel, CBC with Differential/Platelet, Microalbumin / creatinine urine ratio  Chronic pain of left ankle - Plan: Basic metabolic panel, Hemoglobin A1c, Lipid panel, CBC with Differential/Platelet  Foot pain, left - Plan: Basic metabolic panel, Hemoglobin A1c, Lipid panel, CBC with Differential/Platelet Will decide on adding med depending on result of todays lab  Feel she could get reassess about foot   Declined surgery in past  Seems to have  Sig  Pronation and collapse of arches   She will check with ortho specialists and get back if we need to help  Certainly the brace should not be "hurting her foot " so she doesn wear for support .   -Patient advised to return or notify health care team  if  new concerns arise. In interim Return in about 6 months (around 04/05/2019) for depending on labs   4-6 mos.   Patient Instructions  I suspect you may benefit from  reassessment of the foot and ankle  .  Since brace    Hurting .   Let us know how we can help.    Continue weight loss .  Consideration of adding metformin  For sugar control.  Will notify you  of labs when available. And then go from there.    [name].  M.D.

## 2018-10-06 ENCOUNTER — Encounter: Payer: Self-pay | Admitting: Internal Medicine

## 2018-10-06 ENCOUNTER — Ambulatory Visit (INDEPENDENT_AMBULATORY_CARE_PROVIDER_SITE_OTHER): Payer: Medicare Other | Admitting: Internal Medicine

## 2018-10-06 ENCOUNTER — Other Ambulatory Visit: Payer: Self-pay

## 2018-10-06 VITALS — BP 130/74 | HR 80 | Temp 97.6°F | Ht 62.0 in | Wt 239.6 lb

## 2018-10-06 DIAGNOSIS — I2510 Atherosclerotic heart disease of native coronary artery without angina pectoris: Secondary | ICD-10-CM | POA: Diagnosis not present

## 2018-10-06 DIAGNOSIS — Z79899 Other long term (current) drug therapy: Secondary | ICD-10-CM

## 2018-10-06 DIAGNOSIS — I10 Essential (primary) hypertension: Secondary | ICD-10-CM

## 2018-10-06 DIAGNOSIS — M79672 Pain in left foot: Secondary | ICD-10-CM | POA: Diagnosis not present

## 2018-10-06 DIAGNOSIS — E1169 Type 2 diabetes mellitus with other specified complication: Secondary | ICD-10-CM

## 2018-10-06 DIAGNOSIS — E785 Hyperlipidemia, unspecified: Secondary | ICD-10-CM

## 2018-10-06 DIAGNOSIS — G8929 Other chronic pain: Secondary | ICD-10-CM | POA: Diagnosis not present

## 2018-10-06 DIAGNOSIS — Z23 Encounter for immunization: Secondary | ICD-10-CM

## 2018-10-06 DIAGNOSIS — E669 Obesity, unspecified: Secondary | ICD-10-CM

## 2018-10-06 DIAGNOSIS — M25572 Pain in left ankle and joints of left foot: Secondary | ICD-10-CM | POA: Diagnosis not present

## 2018-10-06 LAB — CBC WITH DIFFERENTIAL/PLATELET
Basophils Absolute: 0 10*3/uL (ref 0.0–0.1)
Basophils Relative: 0.5 % (ref 0.0–3.0)
Eosinophils Absolute: 0.1 10*3/uL (ref 0.0–0.7)
Eosinophils Relative: 2.5 % (ref 0.0–5.0)
HCT: 41.1 % (ref 36.0–46.0)
Hemoglobin: 13.9 g/dL (ref 12.0–15.0)
Lymphocytes Relative: 38.6 % (ref 12.0–46.0)
Lymphs Abs: 2.1 10*3/uL (ref 0.7–4.0)
MCHC: 33.8 g/dL (ref 30.0–36.0)
MCV: 96.4 fl (ref 78.0–100.0)
Monocytes Absolute: 0.5 10*3/uL (ref 0.1–1.0)
Monocytes Relative: 10.1 % (ref 3.0–12.0)
Neutro Abs: 2.6 10*3/uL (ref 1.4–7.7)
Neutrophils Relative %: 48.3 % (ref 43.0–77.0)
Platelets: 248 10*3/uL (ref 150.0–400.0)
RBC: 4.26 Mil/uL (ref 3.87–5.11)
RDW: 13.5 % (ref 11.5–15.5)
WBC: 5.3 10*3/uL (ref 4.0–10.5)

## 2018-10-06 LAB — LIPID PANEL
Cholesterol: 171 mg/dL (ref 0–200)
HDL: 62.5 mg/dL (ref >39.00)
LDL Cholesterol: 76 mg/dL (ref 0–99)
NonHDL: 108.96
Total CHOL/HDL Ratio: 3
Triglycerides: 164 mg/dL — ABNORMAL HIGH (ref 0.0–149.0)
VLDL: 32.8 mg/dL (ref 0.0–40.0)

## 2018-10-06 LAB — BASIC METABOLIC PANEL
BUN: 16 mg/dL (ref 6–23)
CO2: 31 mEq/L (ref 19–32)
Calcium: 10.1 mg/dL (ref 8.4–10.5)
Chloride: 101 mEq/L (ref 96–112)
Creatinine, Ser: 0.94 mg/dL (ref 0.40–1.20)
GFR: 57.14 mL/min — ABNORMAL LOW (ref >60.00)
Glucose, Bld: 115 mg/dL — ABNORMAL HIGH (ref 70–99)
Potassium: 4.6 mEq/L (ref 3.5–5.1)
Sodium: 140 mEq/L (ref 135–145)

## 2018-10-06 LAB — HEMOGLOBIN A1C: Hgb A1c MFr Bld: 7.3 % — ABNORMAL HIGH (ref 4.6–6.5)

## 2018-10-06 LAB — MICROALBUMIN / CREATININE URINE RATIO
Creatinine,U: 253.1 mg/dL
Microalb Creat Ratio: 1 mg/g (ref 0.0–30.0)
Microalb, Ur: 2.6 mg/dL — ABNORMAL HIGH (ref 0.0–1.9)

## 2018-10-06 NOTE — Patient Instructions (Addendum)
I suspect you may benefit from  reassessment of the foot and ankle  .  Since brace    Hurting .   Let us know how we can help.    Continue weight loss .  Consideration of adding metformin  For sugar control.  Will notify you  of labs when available. And then go from there.

## 2018-10-10 ENCOUNTER — Encounter: Payer: Self-pay | Admitting: Internal Medicine

## 2018-11-09 ENCOUNTER — Other Ambulatory Visit: Payer: Self-pay | Admitting: Physician Assistant

## 2018-11-10 NOTE — Telephone Encounter (Signed)
Should get from primary care if to continue [facility]

## 2018-11-22 ENCOUNTER — Telehealth: Payer: Self-pay | Admitting: Internal Medicine

## 2018-11-22 MED ORDER — METFORMIN HCL 500 MG PO TABS: 500.0000 mg | ORAL_TABLET | Freq: Every day | ORAL | 1 refills | Status: DC

## 2018-11-22 MED ORDER — ACCU-CHEK SOFT TOUCH LANCETS MISC: 12 refills | Status: DC

## 2018-11-22 NOTE — Telephone Encounter (Signed)
Medication Refill - Medication: metformin, Multi lancing accucheck test strips. Pt is requesting to have [facility] and the test strips sent to [facility]. These are not currently on medication list

## 2018-11-22 NOTE — Telephone Encounter (Signed)
Medication Refill - Medication: metformin, Multi lancing accucheck test strips. Pt is requesting to have metformin sent [facility] and the test strips sent to [facility].   Has the patient contacted their pharmacy? No. (Agent: If no, request that the patient contact the pharmacy for the refill.) (Agent: If yes, when and what did the pharmacy advise?)  Preferred Pharmacy (with phone number or street name):  [place], [place] [facility]  [address] [place] [address]  Phone: [phone] Fax: [phone]  Not a 24 hour pharmacy; exact hours not known.   [facility] [place], [place] AT [facility] [address] [place]  [place] [address]  Phone: [phone] Fax: [phone]  Not a 24 hour pharmacy; exact hours not known.     Agent: Please be advised that RX refills may take up to 3 business days. We ask that you follow-up with your pharmacy.

## 2018-11-22 NOTE — Telephone Encounter (Signed)
Medication has been sent in as per result notes and patient has been notified

## 2018-12-01 ENCOUNTER — Other Ambulatory Visit: Payer: Self-pay | Admitting: Physician Assistant

## 2018-12-01 NOTE — Telephone Encounter (Signed)
This is Dr. Crenshaw's pt 

## 2018-12-25 ENCOUNTER — Other Ambulatory Visit: Payer: Self-pay | Admitting: Cardiology

## 2019-01-18 LAB — HM COLONOSCOPY

## 2019-01-25 ENCOUNTER — Encounter: Payer: Self-pay | Admitting: Internal Medicine

## 2019-03-23 DIAGNOSIS — H5203 Hypermetropia, bilateral: Secondary | ICD-10-CM | POA: Diagnosis not present

## 2019-03-23 DIAGNOSIS — H04123 Dry eye syndrome of bilateral lacrimal glands: Secondary | ICD-10-CM | POA: Diagnosis not present

## 2019-03-23 DIAGNOSIS — H52223 Regular astigmatism, bilateral: Secondary | ICD-10-CM | POA: Diagnosis not present

## 2019-03-23 DIAGNOSIS — H524 Presbyopia: Secondary | ICD-10-CM | POA: Diagnosis not present

## 2019-04-09 ENCOUNTER — Ambulatory Visit: Payer: Medicare Other | Admitting: Family Medicine

## 2019-04-09 ENCOUNTER — Encounter: Payer: Self-pay | Admitting: Family Medicine

## 2019-04-09 ENCOUNTER — Ambulatory Visit (INDEPENDENT_AMBULATORY_CARE_PROVIDER_SITE_OTHER): Payer: Medicare Other | Admitting: Family Medicine

## 2019-04-09 DIAGNOSIS — R300 Dysuria: Secondary | ICD-10-CM | POA: Diagnosis not present

## 2019-04-09 DIAGNOSIS — N3001 Acute cystitis with hematuria: Secondary | ICD-10-CM | POA: Diagnosis not present

## 2019-04-09 LAB — POC URINALSYSI DIPSTICK (AUTOMATED)
Bilirubin, UA: NEGATIVE
Glucose, UA: NEGATIVE
Ketones, UA: NEGATIVE
Nitrite, UA: NEGATIVE
Protein, UA: NEGATIVE
Spec Grav, UA: 1.025 (ref 1.010–1.025)
Urobilinogen, UA: 0.2 E.U./dL
pH, UA: 6 (ref 5.0–8.0)

## 2019-04-09 MED ORDER — AMOXICILLIN 500 MG PO CAPS: 500.0000 mg | ORAL_CAPSULE | Freq: Two times a day (BID) | ORAL | 0 refills | Status: AC

## 2019-04-09 NOTE — Progress Notes (Signed)
Virtual Visit via Telephone Note  I connected with Anna Gamble on 04/09/19 at  4:00 PM EDT by telephone and verified that I am speaking with the correct person using two identifiers.   I discussed the limitations, risks, security and privacy concerns of performing an evaluation and management service by telephone and the availability of in person appointments. I also discussed with the patient that there may be a patient responsible charge related to this service. The patient expressed understanding and agreed to proceed.  Location patient: car Location provider: work or home office Participants present for the call: patient, provider.  Pt's husband driving. Patient did not have a visit in the prior 7 days to address this/these issue(s).   History of Present Illness: Pt is an 84 yo female with pmh sig for severe AS, s/p TAVR, HTN, CAD, GERD, HLD seen by Dr. Regis Bill who p/w acute concern.  Pt with suprapubic pain, dysuria, odor, urinary frequency, and decreased appetite since yesterday.  Denies n/v, fever, chills.  Has back pain at baseline.  Drinking 24-32 oz of water per day and a diet coke.   Observations/Objective: Patient sounds cheerful and well on the phone. I do not appreciate any SOB. Speech and thought processing are grossly intact. Patient reported vitals:  Assessment and Plan: Dysuria  - Plan: POCT Urinalysis Dipstick (Automated)  Acute cystitis with hematuria  -UA with 1+ leuks, 1+ blood, SG 1.025 -Will start Amoxicillin -advised to increase po intake of water and fluids. -May not be able to obtain UCx 2/2 small volume of urine sample given. -given precautions - Plan: amoxicillin (AMOXIL) 500 MG capsule, Urine Culture   Follow Up Instructions: F/u prn  I did not refer this patient for an OV in the next 24 hours for this/these issue(s).  I discussed the assessment and treatment plan with the patient. The patient was provided an opportunity to ask questions  and all were answered. The patient agreed with the plan and demonstrated an understanding of the instructions.   The patient was advised to call back or seek an in-person evaluation if the symptoms worsen or if the condition fails to improve as anticipated.  I provided 6:26 minutes of non-face-to-face time during this encounter.   [name], MD

## 2019-04-10 LAB — URINE CULTURE
MICRO NUMBER:: 10277067
SPECIMEN QUALITY:: ADEQUATE

## 2019-04-13 NOTE — Progress Notes (Signed)
HPI: FU aortic stenosis/AVR. Placed on beta blocker previously secondary to PVCs. Found to have severe aortic stenosis on follow-up echocardiogram March 2020. Cardiac catheterization revealed a 40% proximal right coronary artery and 20% ostial left main. Carotid Dopplers May 2020 showed 1 to 39% bilateral stenosis. Had TAVR May 2020. Last echocardiogram June 2020 showed normal LV systolic function, mild left ventricular hypertrophy, moderate left atrial enlargement, prior TAVR with mean gradient 14 mmHg. Monitor June 2020 showed sinus rhythm with occasional PAC, PVC, couplet and brief PAT. Since last seen,she does have dyspnea on exertion.  She denies orthopnea or PND.  No exertional chest pain or syncope.  Current Outpatient Medications  Medication Sig Dispense Refill  . amoxicillin (AMOXIL) 500 MG tablet Take 4 tablets by mouth one hour prior to dental appointments 4 tablet 3  . aspirin 81 MG tablet Take 81 mg by mouth daily.    . calcium carbonate (TUMS - DOSED IN MG ELEMENTAL CALCIUM) 500 MG chewable tablet Chew 2 tablets by mouth daily as needed for indigestion or heartburn.    . cholecalciferol (VITAMIN D) 1000 UNITS tablet Take 1,000 Units by mouth daily.      . clopidogrel (PLAVIX) 75 MG tablet TAKE 1 TABLET(75 MG) BY MOUTH DAILY WITH BREAKFAST 90 tablet 0  . hydrochlorothiazide (MICROZIDE) 12.5 MG capsule TAKE 1 CAPSULE BY MOUTH  DAILY 90 capsule 3  . Lancets (ACCU-CHEK SOFT TOUCH) lancets Use as instructed 100 each 12  . losartan (COZAAR) 25 MG tablet TAKE 2 TABLETS BY MOUTH  DAILY 180 tablet 3  . Menthol, Topical Analgesic, (BENGAY EX) Apply 1 application topically daily as needed (back pain).    . metFORMIN (GLUCOPHAGE) 500 MG tablet TAKE 1 TABLET BY MOUTH  DAILY 90 tablet 0  . metoprolol succinate (TOPROL-XL) 50 MG 24 hr tablet TAKE 1 TABLET BY MOUTH  DAILY 90 tablet 3  . Misc Natural Products (TART CHERRY ADVANCED PO) Take 1 capsule by mouth daily.     . Multiple Vitamin  (MULTIVITAMINS PO) Take 1 tablet by mouth daily.     . Omega-3 1000 MG CAPS Take 1,000 mg by mouth every morning.     . pantoprazole (PROTONIX) 40 MG tablet Take 1 tablet (40 mg total) by mouth daily. 90 tablet 1  . Polyethyl Glycol-Propyl Glycol (SYSTANE) 0.4-0.3 % GEL ophthalmic gel Place 1 application into both eyes 2 (two) times daily.    . rosuvastatin (CRESTOR) 40 MG tablet TAKE 1 TABLET BY MOUTH  EVERY OTHER DAY 45 tablet 3   No current facility-administered medications for this visit.     Past Medical History:  Diagnosis Date  . CAD (coronary artery disease) 03/19/2014  . Colon polyp   . Coronary artery disease   . GERD (gastroesophageal reflux disease)   . Hx of colonoscopy with polypectomy 07/14/2012   medoff   . Hyperlipidemia   . Hypertension   . Impaired fasting glucose   . Morbid obesity ([place])   . S/P TAVR (transcatheter aortic valve replacement)   . Severe aortic stenosis     Past Surgical History:  Procedure Laterality Date  . APPENDECTOMY    . CHOLECYSTECTOMY    . FOOT SURGERY     rt  . KNEE SURGERY     rt  . LEFT AND RIGHT HEART CATHETERIZATION WITH CORONARY ANGIOGRAM N/A 10/10/2013   Procedure: LEFT AND RIGHT HEART CATHETERIZATION WITH CORONARY ANGIOGRAM;  Surgeon: Burnell Blanks, MD;  Location: [facility] CATH LAB;  Service: Cardiovascular;  Laterality: N/A;  . RIGHT/LEFT HEART CATH AND CORONARY ANGIOGRAPHY N/A 04/06/2018   Procedure: RIGHT/LEFT HEART CATH AND CORONARY ANGIOGRAPHY;  Surgeon: Burnell Blanks, MD;  Location: [place] CV LAB;  Service: Cardiovascular;  Laterality: N/A;  . TEE WITHOUT CARDIOVERSION N/A 06/06/2018   Procedure: TRANSESOPHAGEAL ECHOCARDIOGRAM (TEE);  Surgeon: Burnell Blanks, MD;  Location: [place];  Service: Open Heart Surgery;  Laterality: N/A;  . TONSILLECTOMY    . TOTAL KNEE ARTHROPLASTY  02/01/2011   Procedure: TOTAL KNEE ARTHROPLASTY;  Surgeon: Kerin Salen;  Location: [place];  Service: Orthopedics;  Laterality:  Right;  DEPUY/SIGMA  . TRANSCATHETER AORTIC VALVE REPLACEMENT, TRANSFEMORAL N/A 06/06/2018   Procedure: TRANSCATHETER AORTIC VALVE REPLACEMENT, TRANSFEMORAL;  Surgeon: Burnell Blanks, MD;  Location: [place];  Service: Open Heart Surgery;  Laterality: N/A;    Social History   Socioeconomic History  . Marital status: Married    Spouse name: Not on file  . Number of children: 4  . Years of education: Not on file  . Highest education level: Not on file  Occupational History  . Occupation: Retired    Comment: Takes care of [employer]  . Occupation: Therapist, sports, peds, cardiology    Comment: retired  Tobacco Use  . Smoking status: Never Smoker  . Smokeless tobacco: Never Used  Substance and Sexual Activity  . Alcohol use: No  . Drug use: No  . Sexual activity: Not Currently    Comment: quit 40 yrs ago  Other Topics Concern  . Not on file  Social History Narrative   hh of 3 -4  Married   [place] son   At home    Invalid son paraplegia and husband    And grandson recently     No pets   Fluctuating hh membors she cooks and prepares for them          Social Determinants of Health   Financial Resource Strain:   . Difficulty of Paying Living Expenses:   Food Insecurity:   . Worried About Charity fundraiser in the Last Year:   . Arboriculturist in the Last Year:   Transportation Needs:   . Film/video editor (Medical):   Marland Kitchen Lack of Transportation (Non-Medical):   Physical Activity:   . Days of Exercise per Week:   . Minutes of Exercise per Session:   Stress:   . Feeling of Stress :   Social Connections:   . Frequency of Communication with Friends and Family:   . Frequency of Social Gatherings with Friends and Family:   . Attends Religious Services:   . Active Member of Clubs or Organizations:   . Attends Archivist Meetings:   Marland Kitchen Marital Status:   Intimate Partner Violence:   . Fear of Current or Ex-Partner:   . Emotionally Abused:   Marland Kitchen Physically Abused:    . Sexually Abused:     Family History  Problem Relation Age of Onset  . Coronary artery disease Other        female- 1st degree relative  . Coronary artery disease Other        female- 1st degree relative  . Hypertension Other   . Hyperlipidemia Other   . Heart attack Mother        age 49's  . Heart attack Father        age 57's  . Stroke Brother        died   .  Hearing loss Son   . Sleep apnea Son   . Aortic stenosis Sister   . Stroke Brother   . Heart attack Brother   . Rheum arthritis Sister   . Osteoarthritis Sister   . Arthritis Sister   . Heart Problems Brother     ROS: Knee arthralgias and back pain but no fevers or chills, productive cough, hemoptysis, dysphasia, odynophagia, melena, hematochezia, dysuria, hematuria, rash, seizure activity, orthopnea, PND, pedal edema, claudication. Remaining systems are negative.  Physical Exam: Well-developed obese in no acute distress.  Skin is warm and dry.  HEENT is normal.  Neck is supple.  Chest is clear to auscultation with normal expansion.  Cardiovascular exam is regular rate and rhythm.  1/6 systolic murmur left sternal border.  No diastolic murmur. Abdominal exam nontender or distended. No masses palpated. Extremities show no edema. neuro grossly intact  ECG-normal sinus rhythm at a rate of 71, no ST changes.  Personally reviewed  A/P  1 status post TAVR-continue SBE prophylaxis.  Continue aspirin. DC plavix. Repeat echocardiogram May 2021.  She continues to have some dyspnea on exertion.  This is likely secondary to obesity hypoventilation syndrome and deconditioning.  2 hypertension-blood pressure controlled.  Continue present medical regimen.  Check potassium and renal function.  3 hyperlipidemia-continue statin.  Check lipids and liver.  4 coronary artery disease-mild on catheterization prior to TAVR.  Continue aspirin and statin.  5 palpitations-continue beta-blocker.  6 morbid obesity-we discussed the  importance of weight loss.  Kirk Ruths, MD

## 2019-04-15 ENCOUNTER — Other Ambulatory Visit: Payer: Self-pay | Admitting: Internal Medicine

## 2019-04-19 ENCOUNTER — Ambulatory Visit (INDEPENDENT_AMBULATORY_CARE_PROVIDER_SITE_OTHER): Payer: Medicare Other | Admitting: Cardiology

## 2019-04-19 ENCOUNTER — Encounter: Payer: Self-pay | Admitting: Cardiology

## 2019-04-19 ENCOUNTER — Other Ambulatory Visit: Payer: Self-pay

## 2019-04-19 VITALS — BP 140/70 | HR 68 | Temp 96.8°F | Ht 62.0 in | Wt 235.6 lb

## 2019-04-19 DIAGNOSIS — Z952 Presence of prosthetic heart valve: Secondary | ICD-10-CM | POA: Diagnosis not present

## 2019-04-19 DIAGNOSIS — E7800 Pure hypercholesterolemia, unspecified: Secondary | ICD-10-CM | POA: Diagnosis not present

## 2019-04-19 DIAGNOSIS — I10 Essential (primary) hypertension: Secondary | ICD-10-CM | POA: Diagnosis not present

## 2019-04-19 LAB — COMPREHENSIVE METABOLIC PANEL
ALT: 28 IU/L (ref 0–32)
AST: 23 IU/L (ref 0–40)
Albumin/Globulin Ratio: 1.6 (ref 1.2–2.2)
Albumin: 4.1 g/dL (ref 3.6–4.6)
Alkaline Phosphatase: 74 IU/L (ref 39–117)
BUN/Creatinine Ratio: 16 (ref 12–28)
BUN: 14 mg/dL (ref 8–27)
Bilirubin Total: 0.5 mg/dL (ref 0.0–1.2)
CO2: 25 mmol/L (ref 20–29)
Calcium: 9.7 mg/dL (ref 8.7–10.3)
Chloride: 99 mmol/L (ref 96–106)
Creatinine, Ser: 0.85 mg/dL (ref 0.57–1.00)
GFR calc Af Amer: 74 mL/min/{1.73_m2} (ref >59)
GFR calc non Af Amer: 64 mL/min/{1.73_m2} (ref >59)
Globulin, Total: 2.5 g/dL (ref 1.5–4.5)
Glucose: 176 mg/dL — ABNORMAL HIGH (ref 65–99)
Potassium: 4.4 mmol/L (ref 3.5–5.2)
Sodium: 138 mmol/L (ref 134–144)
Total Protein: 6.6 g/dL (ref 6.0–8.5)

## 2019-04-19 LAB — LIPID PANEL
Chol/HDL Ratio: 3 ratio (ref 0.0–4.4)
Cholesterol, Total: 182 mg/dL (ref 100–199)
HDL: 61 mg/dL (ref >39)
LDL Chol Calc (NIH): 81 mg/dL (ref 0–99)
Triglycerides: 243 mg/dL — ABNORMAL HIGH (ref 0–149)
VLDL Cholesterol Cal: 40 mg/dL (ref 5–40)

## 2019-04-19 NOTE — Patient Instructions (Addendum)
Medication Instructions:  STOP CLOPIDOGREL   *If you need a refill on your cardiac medications before your next appointment, please call your pharmacy*   Lab Work: Your physician recommends that you HAVE LAB WORK TODAY  If you have labs (blood work) drawn today and your tests are completely normal, you will receive your results only by: Marland Kitchen MyChart Message (if you have MyChart) OR . A paper copy in the mail If you have any lab test that is abnormal or we need to change your treatment, we will call you to review the results.     Follow-Up: At [facility], you and your health needs are our priority.  As part of our continuing mission to provide you with exceptional heart care, we have created designated Provider Care Teams.  These Care Teams include your primary Cardiologist (physician) and Advanced Practice Providers (APPs -  Physician Assistants and Nurse Practitioners) who all work together to provide you with the care you need, when you need it.  We recommend signing up for the patient portal called "MyChart".  Sign up information is provided on this After Visit Summary.  MyChart is used to connect with patients for Virtual Visits (Telemedicine).  Patients are able to view lab/test results, encounter notes, upcoming appointments, etc.  Non-urgent messages can be sent to your provider as well.   To learn more about what you can do with MyChart, go to [email or web address].    Your next appointment:   6 month(s)  The format for your next appointment:   Either In Person or Virtual  Provider:   You may see Kirk Ruths MD or one of the following Advanced Practice Providers on your designated Care Team:    Kerin Ransom, PA-C  [place], [place]  Coletta Memos, [place]

## 2019-04-30 ENCOUNTER — Other Ambulatory Visit: Payer: Self-pay

## 2019-04-30 NOTE — Progress Notes (Signed)
This visit occurred during the SARS-CoV-2 public health emergency.  Safety protocols were in place, including screening questions prior to the visit, additional usage of staff PPE, and extensive cleaning of exam room while observing appropriate contact time as indicated for disinfecting solutions.    Chief Complaint  Patient presents with  . Diabetes    f/u  alsu urine incontinence  . Medication Management    HPI: Anna Gamble 84 y.o. come in for Chronic disease management   Last seem by pcp 61 20  Has had uti sx of oncontenence with neg u cx   : seen cards for lipid elevation   FBS 110 and 130 highest   Watching starchy foods and needs refill metformin   Other concerns  ?s   Busts of light in vision  ever since  [place] valve surgery.  od says ok   Gets catch in [place]   No weakness   Bending over and up can get dizzy no syncope  The last 3 mos  Has massive incontinence when stands and  Getting worse .  Legs feels weak like rubber but not necessarily change from baseline  ROS: See pertinent positives and negatives per HPI. Had covid vaccine Past Medical History:  Diagnosis Date  . CAD (coronary artery disease) 03/19/2014  . Colon polyp   . Coronary artery disease   . GERD (gastroesophageal reflux disease)   . Hx of colonoscopy with polypectomy 07/14/2012   medoff   . Hyperlipidemia   . Hypertension   . Impaired fasting glucose   . Morbid obesity ([place])   . S/P TAVR (transcatheter aortic valve replacement)   . Severe aortic stenosis     Family History  Problem Relation Age of Onset  . Coronary artery disease Other        female- 1st degree relative  . Coronary artery disease Other        female- 1st degree relative  . Hypertension Other   . Hyperlipidemia Other   . Heart attack Mother        age 93's  . Heart attack Father        age 29's  . Stroke Brother        died   . Hearing loss Son   . Sleep apnea Son   . Aortic stenosis Sister   . Stroke Brother    . Heart attack Brother   . Rheum arthritis Sister   . Osteoarthritis Sister   . Arthritis Sister   . Heart Problems Brother     Social History   Socioeconomic History  . Marital status: Married    Spouse name: Not on file  . Number of children: 4  . Years of education: Not on file  . Highest education level: Not on file  Occupational History  . Occupation: Retired    Comment: Takes care of [employer]  . Occupation: Therapist, sports, peds, cardiology    Comment: retired  Tobacco Use  . Smoking status: Never Smoker  . Smokeless tobacco: Never Used  Substance and Sexual Activity  . Alcohol use: No  . Drug use: No  . Sexual activity: Not Currently    Comment: quit 40 yrs ago  Other Topics Concern  . Not on file  Social History Narrative   hh of 3 -4  Married   [place] son   At home    Invalid son paraplegia and husband    And grandson recently     No pets  Fluctuating hh membors she cooks and prepares for them          Social Determinants of Radio broadcast assistant Strain:   . Difficulty of Paying Living Expenses:   Food Insecurity:   . Worried About Charity fundraiser in the Last Year:   . Arboriculturist in the Last Year:   Transportation Needs:   . Film/video editor (Medical):   Marland Kitchen Lack of Transportation (Non-Medical):   Physical Activity:   . Days of Exercise per Week:   . Minutes of Exercise per Session:   Stress:   . Feeling of Stress :   Social Connections:   . Frequency of Communication with Friends and Family:   . Frequency of Social Gatherings with Friends and Family:   . Attends Religious Services:   . Active Member of Clubs or Organizations:   . Attends Archivist Meetings:   Marland Kitchen Marital Status:     Outpatient Medications Prior to Visit  Medication Sig Dispense Refill  . amoxicillin (AMOXIL) 500 MG tablet Take 4 tablets by mouth one hour prior to dental appointments 4 tablet 3  . aspirin 81 MG tablet Take 81 mg by mouth daily.    .  calcium carbonate (TUMS - DOSED IN MG ELEMENTAL CALCIUM) 500 MG chewable tablet Chew 2 tablets by mouth daily as needed for indigestion or heartburn.    . cholecalciferol (VITAMIN D) 1000 UNITS tablet Take 1,000 Units by mouth daily.      . hydrochlorothiazide (MICROZIDE) 12.5 MG capsule TAKE 1 CAPSULE BY MOUTH  DAILY 90 capsule 3  . Lancets (ACCU-CHEK SOFT TOUCH) lancets Use as instructed 100 each 12  . losartan (COZAAR) 25 MG tablet TAKE 2 TABLETS BY MOUTH  DAILY 180 tablet 3  . Menthol, Topical Analgesic, (BENGAY EX) Apply 1 application topically daily as needed (back pain).    . metoprolol succinate (TOPROL-XL) 50 MG 24 hr tablet TAKE 1 TABLET BY MOUTH  DAILY 90 tablet 3  . Misc Natural Products (TART CHERRY ADVANCED PO) Take 1 capsule by mouth daily.     . Multiple Vitamin (MULTIVITAMINS PO) Take 1 tablet by mouth daily.     . pantoprazole (PROTONIX) 40 MG tablet Take 1 tablet (40 mg total) by mouth daily. 90 tablet 1  . Polyethyl Glycol-Propyl Glycol (SYSTANE) 0.4-0.3 % GEL ophthalmic gel Place 1 application into both eyes 2 (two) times daily.    . rosuvastatin (CRESTOR) 40 MG tablet TAKE 1 TABLET BY MOUTH  EVERY OTHER DAY 45 tablet 3  . metFORMIN (GLUCOPHAGE) 500 MG tablet TAKE 1 TABLET BY MOUTH  DAILY 90 tablet 0  . Omega-3 1000 MG CAPS Take 1,000 mg by mouth every morning.      No facility-administered medications prior to visit.     EXAM:  BP 130/78 (BP Location: Left Arm, Patient Position: Sitting, Cuff Size: Large)   Pulse 64   Temp 97.8 F (36.6 C) (Temporal)   Wt 235 lb 12.8 oz (107 kg)   SpO2 98%   BMI 43.13 kg/m   Body mass index is 43.13 kg/m. Wt Readings from Last 3 Encounters:  05/01/19 235 lb 12.8 oz (107 kg)  04/19/19 235 lb 9.6 oz (106.9 kg)  10/06/18 239 lb 9.6 oz (108.7 kg)    GENERAL: vitals reviewed and listed above, alert, oriented, appears well hydrated and in no acute distress HEENT: atraumatic, conjunctiva  clear, no obvious abnormalities on  inspection of external nose  and ears OP :masked  NECK: no obvious masses on inspection palpation  LUNGS: clear to auscultation bilaterally, no wheezes, rales or rhonchi, good air movement CV: HRRR, no clubbing cyanosis or  peripheral edema nl cap refill  MS: moves all extremities walks with cane  Antalgic abn gait  PSYCH: pleasant and cooperative, no obvious depression or anxiety Lab Results  Component Value Date   WBC 5.3 10/06/2018   HGB 13.9 10/06/2018   HCT 41.1 10/06/2018   PLT 248.0 10/06/2018   GLUCOSE 176 (H) 04/19/2019   CHOL 182 04/19/2019   TRIG 243 (H) 04/19/2019   HDL 61 04/19/2019   LDLDIRECT 89.0 06/15/2016   LDLCALC 81 04/19/2019   ALT 28 04/19/2019   AST 23 04/19/2019   NA 138 04/19/2019   K 4.4 04/19/2019   CL 99 04/19/2019   CREATININE 0.85 04/19/2019   BUN 14 04/19/2019   CO2 25 04/19/2019   TSH 1.05 04/16/2015   INR 1.0 06/02/2018   HGBA1C 6.3 (A) 05/01/2019   MICROALBUR 2.6 (H) 10/06/2018   BP Readings from Last 3 Encounters:  05/01/19 130/78  04/19/19 140/70  10/06/18 130/74    ASSESSMENT AND PLAN:  Discussed the following assessment and plan:  Diabetes mellitus type 2 in obese ([place]) - Plan: POC HgB A1c  Urinary incontinence, unspecified type -  insenses positional over past months   hx back surgery  no uti  - Plan: Ambulatory referral to Urology  Vision disturbance - sparkles    at times get opthalmologist  check incase  a1c is down  from 7.3 much better  Cont metformin   See ophthalmologist  About  Good eye check  Check bp  w  ortho stasis  In case need  Med adjustment . Fall prevention  Urine  Incontinence   Not uti gushing  When stands up.  consider neurogenic  Or positional plan referral URO  Rov  6 months or as needed  -Patient advised to return or notify health care team  if  new concerns arise. In nterim  Outside external source  DATA REVIEWED: cards  Total time on date  of service including record review ordering and plan of care:    34 minutes    Patient Instructions  Will be contacted about urology referral contact .   Your sugar test is much better  Continue  adjsuting diet and metformin  Check BP readings   If getting dizzy when  Bending  Over .  If low then sometimes need medication adjusted .    Get eye appt with ophthalmologist .   Plan   ROV in 6 months  Or as indicated     Mariann Laster K. Osborne Serio M.D.

## 2019-05-01 ENCOUNTER — Ambulatory Visit (INDEPENDENT_AMBULATORY_CARE_PROVIDER_SITE_OTHER): Payer: Medicare Other | Admitting: Internal Medicine

## 2019-05-01 ENCOUNTER — Encounter: Payer: Self-pay | Admitting: Internal Medicine

## 2019-05-01 VITALS — BP 130/78 | HR 64 | Temp 97.8°F | Wt 235.8 lb

## 2019-05-01 DIAGNOSIS — E669 Obesity, unspecified: Secondary | ICD-10-CM | POA: Diagnosis not present

## 2019-05-01 DIAGNOSIS — H539 Unspecified visual disturbance: Secondary | ICD-10-CM | POA: Diagnosis not present

## 2019-05-01 DIAGNOSIS — R32 Unspecified urinary incontinence: Secondary | ICD-10-CM

## 2019-05-01 DIAGNOSIS — E1169 Type 2 diabetes mellitus with other specified complication: Secondary | ICD-10-CM | POA: Diagnosis not present

## 2019-05-01 LAB — POCT GLYCOSYLATED HEMOGLOBIN (HGB A1C): Hemoglobin A1C: 6.3 % — AB (ref 4.0–5.6)

## 2019-05-01 MED ORDER — METFORMIN HCL 500 MG PO TABS: 500.0000 mg | ORAL_TABLET | Freq: Every day | ORAL | 3 refills | Status: DC

## 2019-05-01 NOTE — Patient Instructions (Addendum)
Will be contacted about urology referral contact .   Your sugar test is much better  Continue  adjsuting diet and metformin  Check BP readings   If getting dizzy when  Bending  Over .  If low then sometimes need medication adjusted .    Get eye appt with ophthalmologist .   Plan   ROV in 6 months  Or as indicated

## 2019-06-03 ENCOUNTER — Other Ambulatory Visit: Payer: Self-pay | Admitting: Cardiology

## 2019-06-05 NOTE — Progress Notes (Signed)
[facility]                                       Cardiology Office Note    Date:  06/07/2019   ID:  Anna Gamble, Anna Gamble 06/25/1935, MRN [id number]  PCP:  Burnis Medin, MD  Cardiologist: [name] Dr. Angelena Form & Dr. Cyndia Bent (TAVR)  CC: 1 year s/p TAVR  History of Present Illness:  Anna Gamble is a 84 y.o. female with a history ofmorbid obesity,HTN, hyperlipidemia andsevereaortic stenosiss/p TAVR (06/06/18) who presents to clinic for follow up.  She underwent successfulTAVR with a58mmMedtronic Evolut Pro +via the TF approach on 06/06/18. Pre TAVR cath showed mild non-obstructive CAD. Post operative echoshowed EF 60% with normally functioning TAVR with mean gradient of 15 mm Hg and no PVL. She was discharged on aspirin and plavix. 1 month echo showed EF 60%, normally functioning TAVR with mean gradient 14 mmHg. At follow up she complained of slow HRs and palpitations. Follow up monitor showed no HAVB or worrisome arrhyhtmia. She has continued to have dyspnea on exertion felt to be related to  obesity hypoventilation syndrome and deconditioning.  Today she presents to clinic for follow up. Has some dizziness with certain head movements. Still has shortness of breath. Cannot walk far due to orthopedic and balance issues. Has fatigue. Sleeps in a recliner but no PND. No dizziness or syncope. No chest pain.     Past Medical History:  Diagnosis Date  . CAD (coronary artery disease) 03/19/2014  . Colon polyp   . Coronary artery disease   . GERD (gastroesophageal reflux disease)   . Hx of colonoscopy with polypectomy 07/14/2012   medoff   . Hyperlipidemia   . Hypertension   . Impaired fasting glucose   . Morbid obesity ([place])   . S/P TAVR (transcatheter aortic valve replacement)   . Severe aortic stenosis     Past Surgical History:  Procedure Laterality Date  . APPENDECTOMY    . CHOLECYSTECTOMY    .  FOOT SURGERY     rt  . KNEE SURGERY     rt  . LEFT AND RIGHT HEART CATHETERIZATION WITH CORONARY ANGIOGRAM N/A 10/10/2013   Procedure: LEFT AND RIGHT HEART CATHETERIZATION WITH CORONARY ANGIOGRAM;  Surgeon: Burnell Blanks, MD;  Location: [facility] CATH LAB;  Service: Cardiovascular;  Laterality: N/A;  . RIGHT/LEFT HEART CATH AND CORONARY ANGIOGRAPHY N/A 04/06/2018   Procedure: RIGHT/LEFT HEART CATH AND CORONARY ANGIOGRAPHY;  Surgeon: Burnell Blanks, MD;  Location: [place] CV LAB;  Service: Cardiovascular;  Laterality: N/A;  . TEE WITHOUT CARDIOVERSION N/A 06/06/2018   Procedure: TRANSESOPHAGEAL ECHOCARDIOGRAM (TEE);  Surgeon: Burnell Blanks, MD;  Location: [place];  Service: Open Heart Surgery;  Laterality: N/A;  . TONSILLECTOMY    . TOTAL KNEE ARTHROPLASTY  02/01/2011   Procedure: TOTAL KNEE ARTHROPLASTY;  Surgeon: Kerin Salen;  Location: [place];  Service: Orthopedics;  Laterality: Right;  DEPUY/SIGMA  . TRANSCATHETER AORTIC VALVE REPLACEMENT, TRANSFEMORAL N/A 06/06/2018   Procedure: TRANSCATHETER AORTIC VALVE REPLACEMENT, TRANSFEMORAL;  Surgeon: Burnell Blanks, MD;  Location: [place];  Service: Open Heart Surgery;  Laterality: N/A;    Current Medications: Outpatient Medications Prior to Visit  Medication Sig Dispense Refill  . amoxicillin (AMOXIL) 500 MG tablet Take 4 tablets by mouth one hour prior to dental appointments 4  tablet 3  . aspirin 81 MG tablet Take 81 mg by mouth daily.    . cholecalciferol (VITAMIN D) 1000 UNITS tablet Take 1,000 Units by mouth daily.      . hydrochlorothiazide (MICROZIDE) 12.5 MG capsule TAKE 1 CAPSULE BY MOUTH  DAILY 90 capsule 3  . Lancets (ACCU-CHEK SOFT TOUCH) lancets Use as instructed 100 each 12  . losartan (COZAAR) 25 MG tablet TAKE 2 TABLETS BY MOUTH  DAILY 180 tablet 3  . Menthol, Topical Analgesic, (BENGAY EX) Apply 1 application topically daily as needed (back pain).    . metFORMIN (GLUCOPHAGE) 500 MG tablet Take 1 tablet  (500 mg total) by mouth daily. 90 tablet 3  . metoprolol succinate (TOPROL-XL) 50 MG 24 hr tablet TAKE 1 TABLET BY MOUTH  DAILY 90 tablet 3  . Misc Natural Products (TART CHERRY ADVANCED PO) Take 1 capsule by mouth daily.     . Multiple Vitamin (MULTIVITAMINS PO) Take 1 tablet by mouth daily.     . Omega-3 1000 MG CAPS Take 1,000 mg by mouth every morning.     . pantoprazole (PROTONIX) 40 MG tablet Take 1 tablet (40 mg total) by mouth daily. 90 tablet 1  . Polyethyl Glycol-Propyl Glycol (SYSTANE) 0.4-0.3 % GEL ophthalmic gel Place 1 application into both eyes 2 (two) times daily.    . rosuvastatin (CRESTOR) 40 MG tablet TAKE 1 TABLET BY MOUTH  EVERY OTHER DAY 45 tablet 3  . vitamin E 180 MG (400 UNITS) capsule Take 1 capsule by mouth daily.    . calcium carbonate (TUMS - DOSED IN MG ELEMENTAL CALCIUM) 500 MG chewable tablet Chew 2 tablets by mouth daily as needed for indigestion or heartburn.     No facility-administered medications prior to visit.     Allergies:   Lisinopril and Statins   Social History   Socioeconomic History  . Marital status: Married    Spouse name: Not on file  . Number of children: 4  . Years of education: Not on file  . Highest education level: Not on file  Occupational History  . Occupation: Retired    Comment: Takes care of [employer]  . Occupation: Therapist, sports, peds, cardiology    Comment: retired  Tobacco Use  . Smoking status: Never Smoker  . Smokeless tobacco: Never Used  Substance and Sexual Activity  . Alcohol use: No  . Drug use: No  . Sexual activity: Not Currently    Comment: quit 40 yrs ago  Other Topics Concern  . Not on file  Social History Narrative   hh of 3 -4  Married   [place] son   At home    Invalid son paraplegia and husband    And grandson recently     No pets   Fluctuating hh membors she cooks and prepares for them          Social Determinants of Health   Financial Resource Strain:   . Difficulty of Paying Living Expenses:    Food Insecurity:   . Worried About Charity fundraiser in the Last Year:   . Arboriculturist in the Last Year:   Transportation Needs:   . Film/video editor (Medical):   Marland Kitchen Lack of Transportation (Non-Medical):   Physical Activity:   . Days of Exercise per Week:   . Minutes of Exercise per Session:   Stress:   . Feeling of Stress :   Social Connections:   . Frequency of Communication  with Friends and Family:   . Frequency of Social Gatherings with Friends and Family:   . Attends Religious Services:   . Active Member of Clubs or Organizations:   . Attends Archivist Meetings:   Marland Kitchen Marital Status:      Family History:  The patient's family history includes Aortic stenosis in her sister; Arthritis in her sister; Coronary artery disease in some other family members; Hearing loss in her son; Heart Problems in her brother; Heart attack in her brother, father, and mother; Hyperlipidemia in an other family member; Hypertension in an other family member; Osteoarthritis in her sister; Rheum arthritis in her sister; Sleep apnea in her son; Stroke in her brother and brother.     ROS:   Please see the history of present illness.    ROS All other systems reviewed and are negative.   PHYSICAL EXAM:   VS:  BP 124/68   Pulse 70   Ht 5\' 2"  (1.575 m)   Wt 235 lb 12 oz (106.9 kg)   SpO2 96%   BMI 43.12 kg/m    GEN: Well nourished, well developed, in no acute distress, obese HEENT: normal Neck: no JVD or masses Cardiac: RRR; no murmurs, rubs, or gallops,no edema  Respiratory:  clear to auscultation bilaterally, normal work of breathing GI: soft, nontender, nondistended, + BS MS: no deformity or atrophy Skin: warm and dry, no rash Neuro:  Alert and Oriented x 3, Strength and sensation are intact Psych: euthymic mood, full affect   Wt Readings from Last 3 Encounters:  06/07/19 235 lb 12 oz (106.9 kg)  05/01/19 235 lb 12.8 oz (107 kg)  04/19/19 235 lb 9.6 oz (106.9 kg)       Studies/Labs Reviewed:   EKG:  EKG is not ordered today.   Recent Labs: 10/06/2018: Hemoglobin 13.9; Platelets 248.0 04/19/2019: ALT 28; BUN 14; Creatinine, Ser 0.85; Potassium 4.4; Sodium 138   Lipid Panel    Component Value Date/Time   CHOL 182 04/19/2019 0903   TRIG 243 (H) 04/19/2019 0903   HDL 61 04/19/2019 0903   CHOLHDL 3.0 04/19/2019 0903   CHOLHDL 3 10/06/2018 0910   VLDL 32.8 10/06/2018 0910   LDLCALC 81 04/19/2019 0903   LDLDIRECT 89.0 06/15/2016 1033    Additional studies/ records that were reviewed today include:  TAVR OPERATIVE NOTE  Date of Procedure:06/06/2018  Preoperative Diagnosis:Severe Aortic Stenosis   Postoperative Diagnosis:Same   Procedure:   Transcatheter Aortic Valve Replacement - PercutaneousRightTransfemoral Approach Medtronic Evolut Pro +(size58mm, serial # [id number])  Co-Surgeons:Bryan Alveria Apley, MD and Lauree Chandler, MD   Anesthesiologist:Kevin Smith Robert, MD  Echocardiographer:Peter Johnsie Cancel, MD  Pre-operative Echo Findings: ? Severe aortic stenosis ? Normal left ventricular systolic function  Post-operative Echo Findings: ? trivialparavalvular leak ? Normal left ventricular systolic function  _____________   Echo 06/07/18 IMPRESSIONS 1. The left ventricle has normal systolic function, with an ejection fraction of 60-65%. The cavity size was normal. There is mild concentric left ventricular hypertrophy. Left ventricular diastolic Doppler parameters are consistent with impaired  relaxation. Elevated mean left atrial pressure. 2. The right ventricle has normal systolc function. The cavity was normal. There is no increase in right ventricular wall thickness. Right ventricular systolic pressure could not be assessed. 3. No evidence of mitral valve stenosis. 4. Mild stenosis of the aortic valve. 5. - TAVR:  A 65mm Medtronic Evolut TAVR bioprosthesis was present and functioning properly. Peak velocity 2.5 ms. Mean gradient 15 mmHg.   _____________  Echo 06/27/18 IMPRESSIONS 1. The left ventricle has normal systolic function with an ejection fraction of 60-65%. The cavity size was normal. There is mild concentric left ventricular hypertrophy. Left ventricular diastolic Doppler parameters are consistent with pseudonormalization. Elevated mean left atrial pressure No evidence of left ventricular regional wall motion abnormalities. 2. The right ventricle has normal systolic function. The cavity was normal. There is no increase in right ventricular wall thickness. Right ventricular systolic pressure is normal with an estimated pressure of 35.0 mmHg. 3. Left atrial size was moderately dilated. 4. A 9mm CoreValve-EvolutR bioprosthetic aortic valve (TAVR) valve is present in the aortic position. Peak and mean gradients are 27 and 14 mm Hg, respectively. 5. The aortic root and ascending aorta are normal in size and structure. 6. When compared to the prior study: no change in TAVR gradients since 06/07/2018.  __________________  Echo 06/07/19 IMPRESSIONS    1. Left ventricular ejection fraction, by estimation, is 60 to 65%. The  left ventricle has normal function. The left ventricle has no regional  wall motion abnormalities. There is mild concentric left ventricular  hypertrophy. Left ventricular diastolic  parameters are consistent with Grade I diastolic dysfunction (impaired  relaxation).  2. Right ventricular systolic function is normal. The right ventricular  size is normal. There is normal pulmonary artery systolic pressure.  3. Left atrial size was moderately dilated.  4. The mitral valve is normal in structure. No evidence of mitral valve  regurgitation. No evidence of mitral stenosis.  5. The aortic valve has been repaired/replaced. Aortic valve  regurgitation is not  visualized. No aortic stenosis is present. Echo  findings are consistent with normal structure and function of the aortic  valve prosthesis. Aortic valve mean gradient  measures 13.7 mmHg. Aortic valve Vmax measures 2.52 m/s.  6. The inferior vena cava is normal in size with greater than 50%  respiratory variability, suggesting right atrial pressure of 3 mmHg.   ASSESSMENT & PLAN:   Severe AS s/p TAVR: doing okay has chronic dyspnea and fatigue with NYHA class II symptoms related to morbid obesity. Echo today shows EF 60%, normally functioning TAVR with a mean gradient of 13.7 mm Hg and no PVL. She has amoxicillin for SBE prophylaxis. Continue on aspirin 81 mg daily indefinitely.    Medication Adjustments/Labs and Tests Ordered: Current medicines are reviewed at length with the patient today.  Concerns regarding medicines are outlined above.  Medication changes, Labs and Tests ordered today are listed in the Patient Instructions below. There are no Patient Instructions on file for this visit.   Signed, Angelena Form, PA-C  06/07/2019 2:49 PM    [facility] [address], [place], [place]  [address] Phone: [phone]; Fax: [phone]

## 2019-06-07 ENCOUNTER — Other Ambulatory Visit: Payer: Self-pay

## 2019-06-07 ENCOUNTER — Ambulatory Visit (HOSPITAL_COMMUNITY): Payer: Medicare Other | Attending: Cardiovascular Disease

## 2019-06-07 ENCOUNTER — Encounter: Payer: Self-pay | Admitting: Physician Assistant

## 2019-06-07 ENCOUNTER — Ambulatory Visit (INDEPENDENT_AMBULATORY_CARE_PROVIDER_SITE_OTHER): Payer: Medicare Other | Admitting: Physician Assistant

## 2019-06-07 VITALS — BP 124/68 | HR 70 | Ht 62.0 in | Wt 235.8 lb

## 2019-06-07 DIAGNOSIS — Z952 Presence of prosthetic heart valve: Secondary | ICD-10-CM | POA: Diagnosis not present

## 2019-06-07 LAB — ECHOCARDIOGRAM COMPLETE
Height: 62 in
Weight: 3772 oz

## 2019-06-07 NOTE — Patient Instructions (Signed)
We will call you with your echo results!

## 2019-06-16 IMAGING — CT CT HEAR MORPH WITH CTA COR WITH SCORE WITH CA WITH CONTRAST AND
2 of 8 series · 10 of 20 positions shown, 12 images · IV contrast (APPLIED)
Comparison: 11/29/2016 CT angiogram of the chest.

Addendum:
CLINICAL DATA: 82-year-old female with severe aortic stenosis being
evaluated for a TAVR procedure.

EXAM:
Cardiac TAVR CT
TECHNIQUE: The patient was scanned on a Phillips Force scanner. A 120 kV
retrospective scan was triggered in the descending thoracic aorta at
111 HU's. Gantry rotation speed was 250 msecs and collimation was .6
mm. No beta blockade or nitro were given. The 3D data set was
reconstructed in 5% intervals of the R-R cycle. Systolic and
diastolic phases were analyzed on a dedicated work station using
MPR, MIP and VRT modes. The patient received 80 cc of contrast.

[Series 8: 0-90% · axial · 0.39mm/px · z∈[+1087,+1234]mm · 5 of 6935 slices shown, 7 images]
[im 1156/6935  vessel]
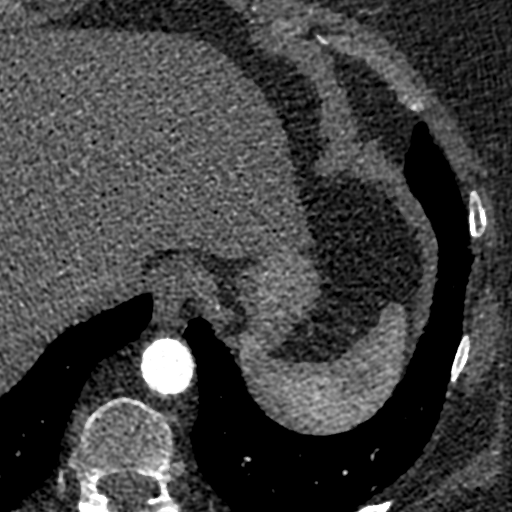
[im 1156/6935  lung]
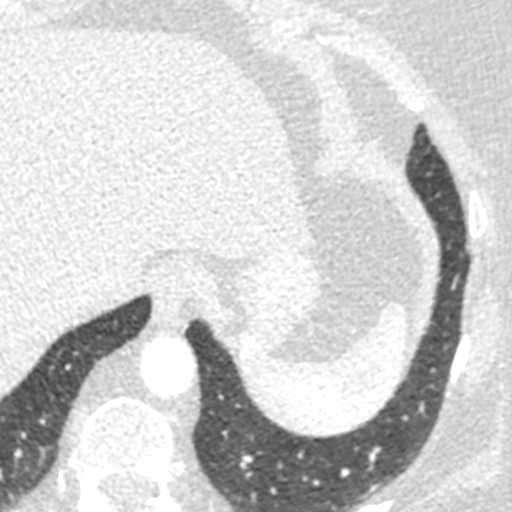
[im 2312/6935  vessel]
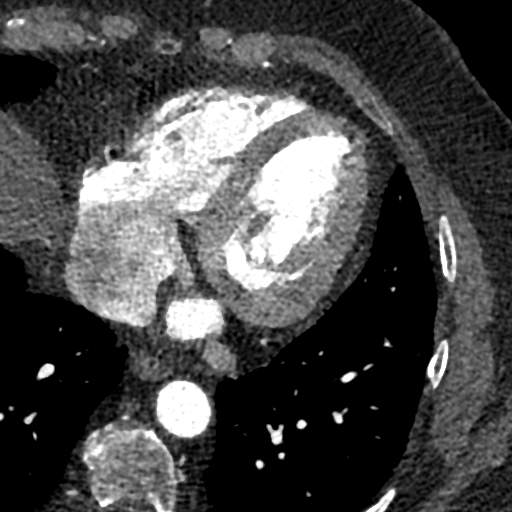
[im 3468/6935  vessel]
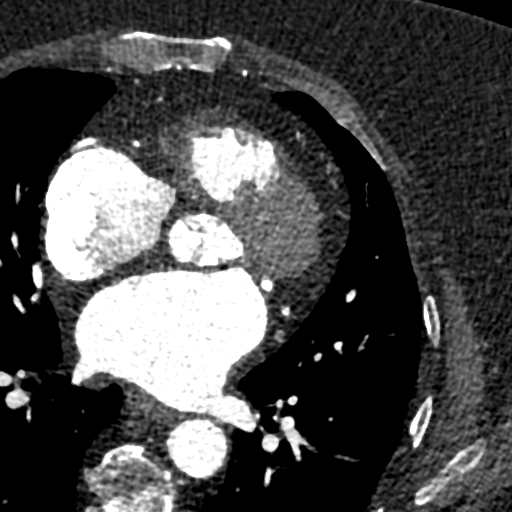
[im 4623/6935  vessel]
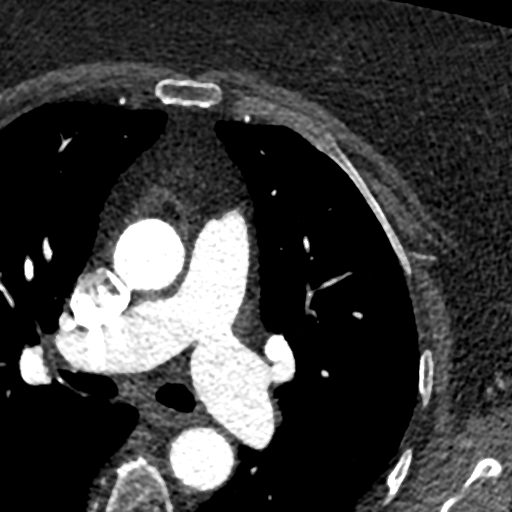
[im 5779/6935  vessel]
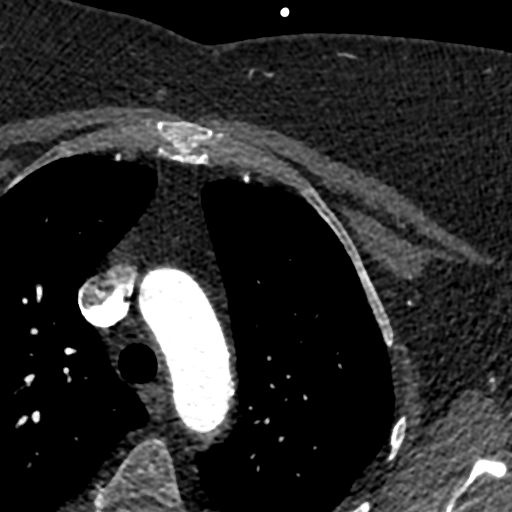
[im 5779/6935  lung]
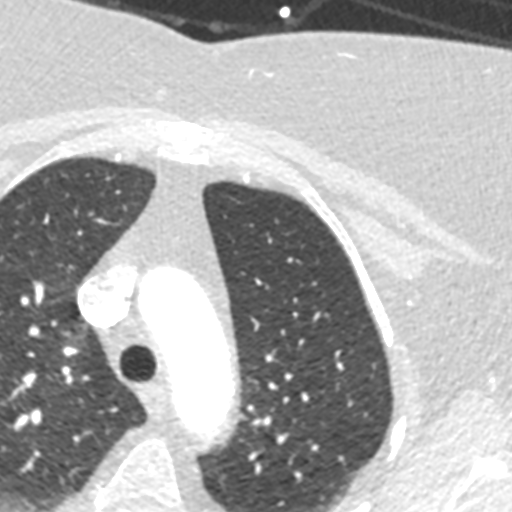

[Series 9: 5-95% · axial · 0.39mm/px · z∈[+1087,+1234]mm · 5 of 6935 slices shown]
[im 1156/6935  vessel]
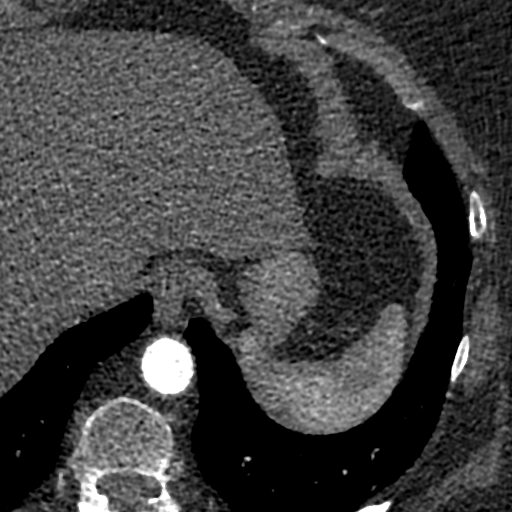
[im 2312/6935  vessel]
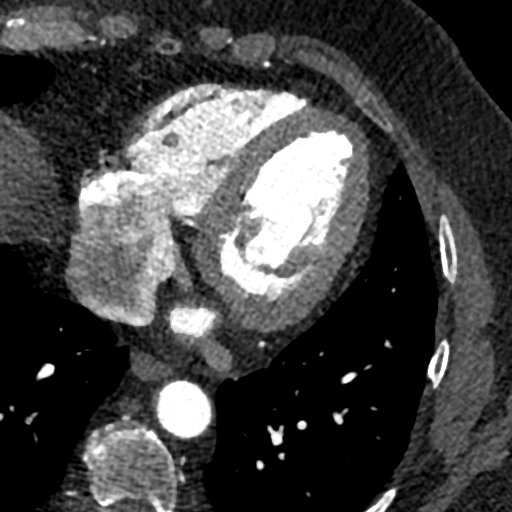
[im 3468/6935  vessel]
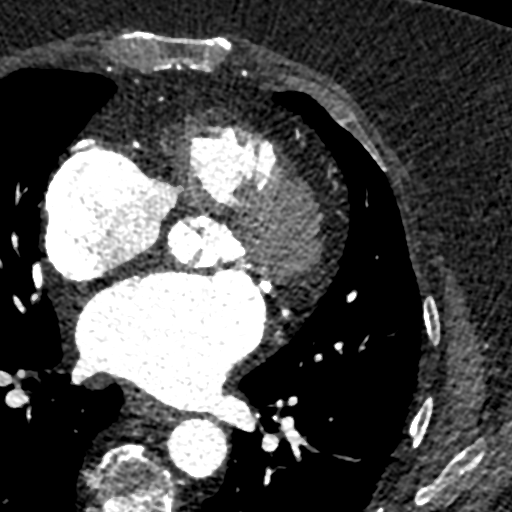
[im 4623/6935  vessel]
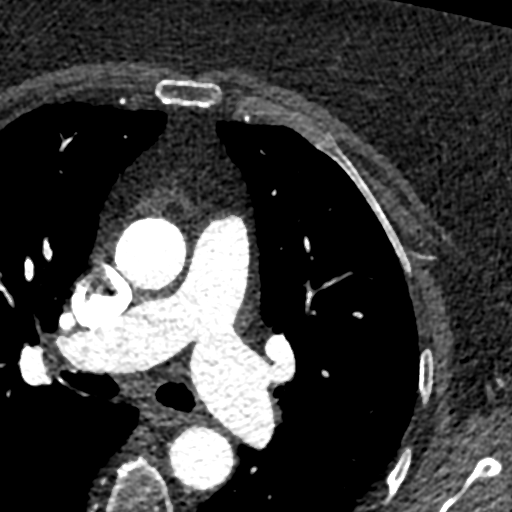
[im 5779/6935  vessel]
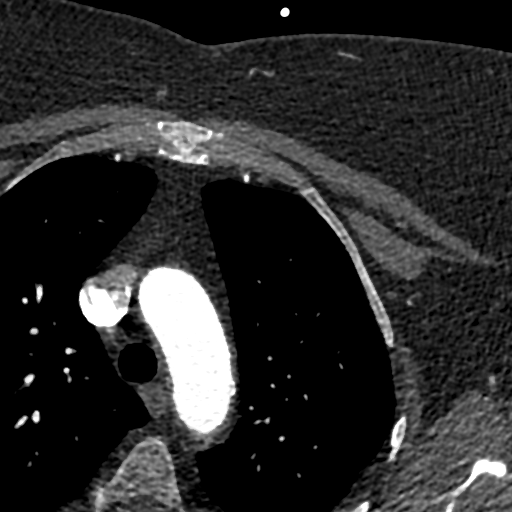

[10 of 20 positions shown; findings below may reference images not displayed]

FINDINGS: Aortic Valve: Trileaflet aortic valve with severely thickened and
calcified leaflets, severely restricted leaflet opening and minimal
calcifications extending into the LVOT.

Aorta: Normal size with mild diffuse atherosclerotic plaque and
calcifications and no dissection.

Sinotubular Junction: 23 x 22 mm

Ascending Thoracic Aorta: 28 x 28 mm

Aortic Arch: 25 x 23 mm

Descending Thoracic Aorta: 23 x 22 mm

Sinus of Valsalva Measurements:

Non-coronary: 25 mm

Right -coronary: 23 mm

Left -coronary: 25 mm

Coronary Artery Height above Annulus:

Left Main: 14 mm

Right Coronary: 12 mm

Virtual Basal Annulus Measurements:

Maximum/Minimum Diameter: 22.9 x 18.4 mm

Mean Diameter: 20.1 mm

Perimeter: 65.1 mm

Area: 317 mm2

Optimum Fluoroscopic Angle for Delivery: RAO 8 RCA 9.
IMPRESSION: 1. Trileaflet aortic valve with severely thickened and calcified
leaflets, severely restricted leaflet opening and minimal
calcifications extending into the LVOT. Annular measurements
suitable for delivery of a 23 mm Edwards-SAPIEN 3 valve (based on
mean diameter).

2. Sufficient coronary to annulus distance.

3. Optimum Fluoroscopic Angle for Delivery:  RAO 8 RCA 9.

4. No thrombus in the left atrial appendage.

EXAM:
OVER-READ INTERPRETATION  CT CHEST

The following report is an over-read performed by radiologist Dr.
does not include interpretation of cardiac or coronary anatomy or
pathology. The cardiac CTA interpretation by the cardiologist is
attached.
FINDINGS: Please see the separate concurrent chest CT angiogram report for
details.
IMPRESSION: Please see the separate concurrent chest CT angiogram report for
details.

*** End of Addendum ***
FINDINGS: Aortic Valve: Trileaflet aortic valve with severely thickened and
calcified leaflets, severely restricted leaflet opening and minimal
calcifications extending into the LVOT.

Aorta: Normal size with mild diffuse atherosclerotic plaque and
calcifications and no dissection.

Sinotubular Junction: 23 x 22 mm

Ascending Thoracic Aorta: 28 x 28 mm

Aortic Arch: 25 x 23 mm

Descending Thoracic Aorta: 23 x 22 mm

Sinus of Valsalva Measurements:

Non-coronary: 25 mm

Right -coronary: 23 mm

Left -coronary: 25 mm

Coronary Artery Height above Annulus:

Left Main: 14 mm

Right Coronary: 12 mm

Virtual Basal Annulus Measurements:

Maximum/Minimum Diameter: 22.9 x 18.4 mm

Mean Diameter: 20.1 mm

Perimeter: 65.1 mm

Area: 317 mm2

Optimum Fluoroscopic Angle for Delivery: RAO 8 RCA 9.
IMPRESSION: 1. Trileaflet aortic valve with severely thickened and calcified
leaflets, severely restricted leaflet opening and minimal
calcifications extending into the LVOT. Annular measurements
suitable for delivery of a 23 mm Edwards-SAPIEN 3 valve (based on
mean diameter).

2. Sufficient coronary to annulus distance.

3. Optimum Fluoroscopic Angle for Delivery:  RAO 8 RCA 9.

4. No thrombus in the left atrial appendage.

## 2019-06-22 IMAGING — DX PORTABLE CHEST - 1 VIEW
1 series · 1 of 1 positions shown · non-contrast
Comparison: 06/02/2018 and earlier.

CLINICAL DATA: 82-year-old female status post TAVR. Postoperative
day zero.

EXAM:
PORTABLE CHEST 1 VIEW

[chest ap]
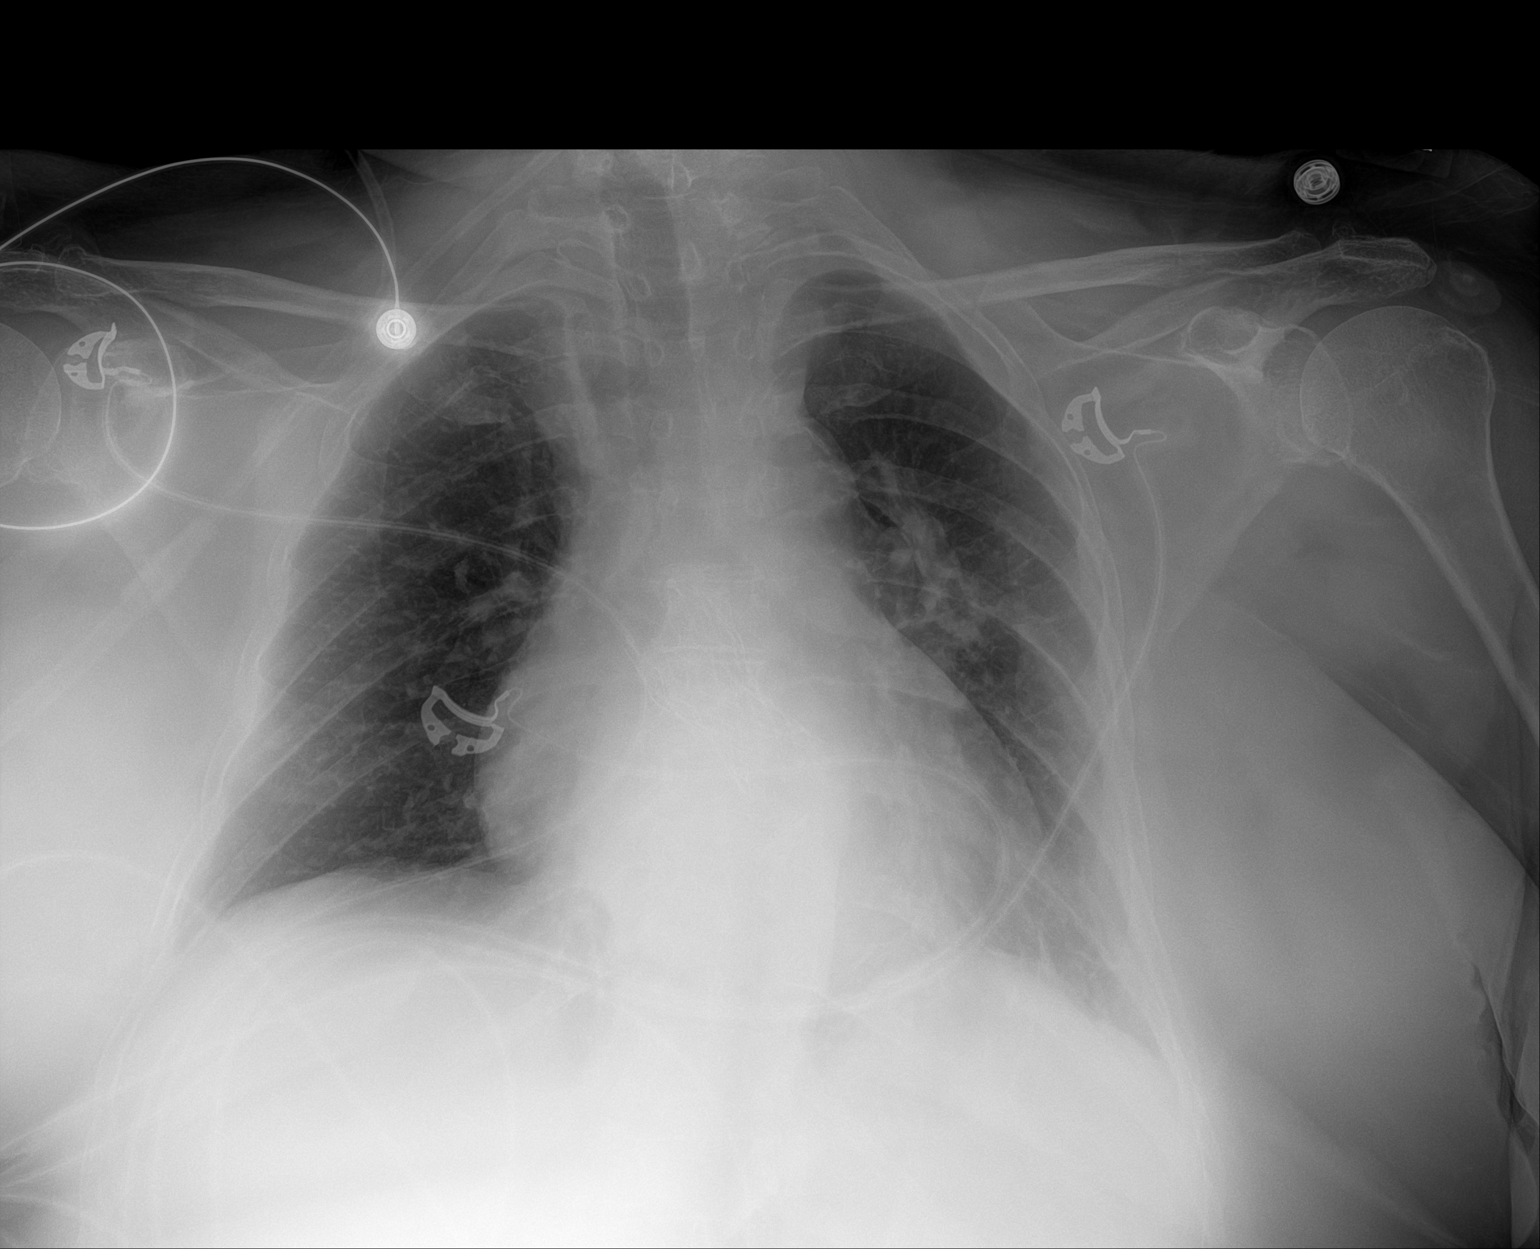

[1 of 1 positions shown; findings below may reference images not displayed]

FINDINGS: Portable AP semi upright view at 3706 hours. Stable cardiac size and
mediastinal contours. Sequelae of TAVR now demonstrated. Visualized
tracheal air column is within normal limits. Mildly lower lung
volumes. No pneumothorax, pulmonary edema or pleural effusion.
Paucity of bowel gas in the upper abdomen.
IMPRESSION: Sequelae of TAVR. Mildly lower lung volumes with no acute
cardiopulmonary abnormality.

## 2019-07-11 ENCOUNTER — Telehealth: Payer: Self-pay | Admitting: Internal Medicine

## 2019-07-11 NOTE — Telephone Encounter (Signed)
Left message for patient to schedule Annual Wellness Visit.  Please schedule with Nurse Health Advisor Shannon Crews, RN at [facility]  

## 2019-07-13 DIAGNOSIS — R350 Frequency of micturition: Secondary | ICD-10-CM | POA: Diagnosis not present

## 2019-07-13 NOTE — Telephone Encounter (Signed)
Scheduled AWV with Health Advisor.

## 2019-07-24 ENCOUNTER — Ambulatory Visit (INDEPENDENT_AMBULATORY_CARE_PROVIDER_SITE_OTHER): Payer: Medicare Other

## 2019-07-24 ENCOUNTER — Other Ambulatory Visit: Payer: Self-pay

## 2019-07-24 VITALS — BP 110/68 | HR 74 | Temp 97.9°F | Resp 20 | Ht 62.0 in | Wt 234.4 lb

## 2019-07-24 DIAGNOSIS — Z0000 Encounter for general adult medical examination without abnormal findings: Secondary | ICD-10-CM | POA: Diagnosis not present

## 2019-07-24 NOTE — Progress Notes (Signed)
Subjective:   Anna Gamble is a 84 y.o. female who presents for Medicare Annual (Subsequent) preventive examination.  Review of Systems      Cardiac Risk Factors include: advanced age (>30men, >31 women);diabetes mellitus;dyslipidemia;obesity (BMI >30kg/m2);hypertension     Objective:    Today's Vitals   07/24/19 1325  BP: 110/68  Pulse: 74  Resp: 20  Temp: 97.9 F (36.6 C)  SpO2: 96%  Weight: 234 lb 6.4 oz (106.3 kg)  Height: 5\' 2"  (1.575 m)   Body mass index is 42.87 kg/m.  Advanced Directives 07/24/2019 06/02/2018 05/31/2018 04/06/2018 03/10/2018 11/09/2016 06/17/2016  Does Patient Have a Medical Advance Directive? No No No No Yes Yes No  Type of Advance Directive - - - - Press photographer;Living will - -  Does patient want to make changes to medical advance directive? - - - - No - Patient declined - -  Copy of Santa Claus in Chart? - - - - No - copy requested - -  Would patient like information on creating a medical advance directive? No - Patient declined No - Patient declined No - Patient declined No - Patient declined - - No - Patient declined    Current Medications (verified) Outpatient Encounter Medications as of 07/24/2019  Medication Sig  . amoxicillin (AMOXIL) 500 MG tablet Take 4 tablets by mouth one hour prior to dental appointments  . aspirin 81 MG tablet Take 81 mg by mouth daily.  . cholecalciferol (VITAMIN D) 1000 UNITS tablet Take 1,000 Units by mouth daily.    . hydrochlorothiazide (MICROZIDE) 12.5 MG capsule TAKE 1 CAPSULE BY MOUTH  DAILY  . Lancets (ACCU-CHEK SOFT TOUCH) lancets Use as instructed  . losartan (COZAAR) 25 MG tablet TAKE 2 TABLETS BY MOUTH  DAILY  . Menthol, Topical Analgesic, (BENGAY EX) Apply 1 application topically daily as needed (back pain).  . metFORMIN (GLUCOPHAGE) 500 MG tablet Take 1 tablet (500 mg total) by mouth daily.  . metoprolol succinate (TOPROL-XL) 50 MG 24 hr tablet TAKE 1 TABLET BY MOUTH   DAILY  . Misc Natural Products (TART CHERRY ADVANCED PO) Take 1 capsule by mouth daily.   . Multiple Vitamin (MULTIVITAMINS PO) Take 1 tablet by mouth daily.   . pantoprazole (PROTONIX) 40 MG tablet Take 1 tablet (40 mg total) by mouth daily.  Vladimir Faster Glycol-Propyl Glycol (SYSTANE) 0.4-0.3 % GEL ophthalmic gel Place 1 application into both eyes 2 (two) times daily.  . vitamin E 180 MG (400 UNITS) capsule Take 1 capsule by mouth daily.  . Omega-3 1000 MG CAPS Take 1,000 mg by mouth every morning.  (Patient not taking: Reported on 07/24/2019)  . rosuvastatin (CRESTOR) 40 MG tablet TAKE 1 TABLET BY MOUTH  EVERY OTHER DAY (Patient not taking: Reported on 07/24/2019)   No facility-administered encounter medications on file as of 07/24/2019.    Allergies (verified) Lisinopril and Statins   History: Past Medical History:  Diagnosis Date  . CAD (coronary artery disease) 03/19/2014  . Colon polyp   . Coronary artery disease   . GERD (gastroesophageal reflux disease)   . Hx of colonoscopy with polypectomy 07/14/2012   medoff   . Hyperlipidemia   . Hypertension   . Impaired fasting glucose   . Morbid obesity ([place])   . S/P TAVR (transcatheter aortic valve replacement)   . Severe aortic stenosis    Past Surgical History:  Procedure Laterality Date  . APPENDECTOMY    . CHOLECYSTECTOMY    .  FOOT SURGERY     rt  . KNEE SURGERY     rt  . LEFT AND RIGHT HEART CATHETERIZATION WITH CORONARY ANGIOGRAM N/A 10/10/2013   Procedure: LEFT AND RIGHT HEART CATHETERIZATION WITH CORONARY ANGIOGRAM;  Surgeon: Burnell Blanks, MD;  Location: [facility] CATH LAB;  Service: Cardiovascular;  Laterality: N/A;  . RIGHT/LEFT HEART CATH AND CORONARY ANGIOGRAPHY N/A 04/06/2018   Procedure: RIGHT/LEFT HEART CATH AND CORONARY ANGIOGRAPHY;  Surgeon: Burnell Blanks, MD;  Location: [place] CV LAB;  Service: Cardiovascular;  Laterality: N/A;  . TEE WITHOUT CARDIOVERSION N/A 06/06/2018   Procedure: TRANSESOPHAGEAL  ECHOCARDIOGRAM (TEE);  Surgeon: Burnell Blanks, MD;  Location: [place];  Service: Open Heart Surgery;  Laterality: N/A;  . TONSILLECTOMY    . TOTAL KNEE ARTHROPLASTY  02/01/2011   Procedure: TOTAL KNEE ARTHROPLASTY;  Surgeon: Kerin Salen;  Location: [place];  Service: Orthopedics;  Laterality: Right;  DEPUY/SIGMA  . TRANSCATHETER AORTIC VALVE REPLACEMENT, TRANSFEMORAL N/A 06/06/2018   Procedure: TRANSCATHETER AORTIC VALVE REPLACEMENT, TRANSFEMORAL;  Surgeon: Burnell Blanks, MD;  Location: [place];  Service: Open Heart Surgery;  Laterality: N/A;   Family History  Problem Relation Age of Onset  . Coronary artery disease Other        female- 1st degree relative  . Coronary artery disease Other        female- 1st degree relative  . Hypertension Other   . Hyperlipidemia Other   . Heart attack Mother        age 58's  . Heart attack Father        age 23's  . Stroke Brother        died   . Hearing loss Son   . Sleep apnea Son   . Aortic stenosis Sister   . Stroke Brother   . Heart attack Brother   . Rheum arthritis Sister   . Osteoarthritis Sister   . Arthritis Sister   . Heart Problems Brother    Social History   Socioeconomic History  . Marital status: Married    Spouse name: Not on file  . Number of children: 4  . Years of education: Not on file  . Highest education level: Not on file  Occupational History  . Occupation: Retired    Comment: Takes care of [employer]  . Occupation: Therapist, sports, peds, cardiology    Comment: retired  Tobacco Use  . Smoking status: Never Smoker  . Smokeless tobacco: Never Used  Vaping Use  . Vaping Use: Never used  Substance and Sexual Activity  . Alcohol use: No  . Drug use: No  . Sexual activity: Not Currently    Comment: quit 40 yrs ago  Other Topics Concern  . Not on file  Social History Narrative   hh of 3 -4  Married   [place] son   At home    Invalid son paraplegia and husband    And grandson recently     No pets    Fluctuating hh membors she cooks and prepares for them          Social Determinants of Health   Financial Resource Strain: Low Risk   . Difficulty of Paying Living Expenses: Not hard at all  Food Insecurity: No Food Insecurity  . Worried About Charity fundraiser in the Last Year: Never true  . Ran Out of Food in the Last Year: Never true  Transportation Needs: No Transportation Needs  . Lack of Transportation (  Medical): No  . Lack of Transportation (Non-Medical): No  Physical Activity: Insufficiently Active  . Days of Exercise per Week: 4 days  . Minutes of Exercise per Session: 10 min  Stress: No Stress Concern Present  . Feeling of Stress : Not at all  Social Connections: Moderately Integrated  . Frequency of Communication with Friends and Family: More than three times a week  . Frequency of Social Gatherings with Friends and Family: Once a week  . Attends Religious Services: More than 4 times per year  . Active Member of Clubs or Organizations: No  . Attends Archivist Meetings: Never  . Marital Status: Married    Tobacco Counseling Counseling given: Not Answered   Clinical Intake:  Pre-visit preparation completed: Yes  Pain : No/denies pain     BMI - recorded: 42.87 Nutritional Status: BMI > 30  Obese Nutritional Risks: None Diabetes: Yes (pt states she takes metformin today cbg 137) CBG done?: Yes CBG resulted in Enter/ Edit results?: No (137) Did pt. bring in CBG monitor from home?: No  How often do you need to have someone help you when you read instructions, pamphlets, or other written materials from your doctor or pharmacy?: 1 - Never  Diabetic?Yes  Interpreter Needed?: No  Information entered by :: Charlott Rakes, LPN   Activities of Daily Living In your present state of health, do you have any difficulty performing the following activities: 07/24/2019  Hearing? Y  Comment mild hearing loss declines hearing aids at this time  Vision? N    Difficulty concentrating or making decisions? N  Walking or climbing stairs? N  Dressing or bathing? N  Doing errands, shopping? N  Preparing Food and eating ? N  Using the Toilet? N  In the past six months, have you accidently leaked urine? Y  Comment wear poise to aid in accidents related to urgency  Do you have problems with loss of bowel control? N  Managing your Medications? N  Managing your Finances? N  Housekeeping or managing your Housekeeping? N  Some recent data might be hidden    Patient Care Team: [name], [name], MD as PCP - General Tanda Rockers, MD (Pulmonary Disease) Almedia Balls, MD (Orthopedic Surgery) Richmond Campbell, MD (Gastroenterology) Lelon Perla, MD as Attending Physician (Cardiology) Marica Otter, OD (Optometry)  Indicate any recent Medical Services you may have received from other than [facility] providers in the past year (date may be approximate).     Assessment:   This is a routine wellness examination for Marlaine.  Hearing/Vision screen  Hearing Screening   125Hz  250Hz  500Hz  1000Hz  2000Hz  3000Hz  4000Hz  6000Hz  8000Hz   Right ear:           Left ear:           Comments: Pt states hearing difficulty at times denies hearing aids a this time  Vision Screening Comments: Pt wears eyeglasses and follows up with Dr [facility] at [facility]  Dietary issues and exercise activities discussed: Current Exercise Habits: Home exercise routine, Type of exercise: Other - see comments (stationary bike), Time (Minutes): 10, Frequency (Times/Week): 4, Weekly Exercise (Minutes/Week): 40, Intensity: Mild, Exercise limited by: cardiac condition(s);orthopedic condition(s);respiratory conditions(s)  Goals    . patient     Wants to have more energy Wants to be more active and feels this would help her weight loss; feels you could get 25 lbs off [facility]  Will get on  recumbent bike at home around lunch time Will start doing a weight  plan; light weight; low as tolerated       . Patient Stated     Have heart surgery, and start doing weights again! Consider doing chair exercises in the meantime    . Patient Stated     Want to be more steady and walk without cane.      Depression Screen PHQ 2/9 Scores 07/24/2019 10/06/2018 03/10/2018 02/27/2018 11/09/2016 06/15/2016 04/16/2015  PHQ - 2 Score 0 0 0 0 0 0 0  PHQ- 9 Score - - 2 - - - -    Fall Risk Fall Risk  07/24/2019 10/06/2018 03/10/2018 02/27/2018 11/09/2016  Falls in the past year? 1 1 1 1  No  Comment - - - - fell in garden x 84 yo and broke her hand  Number falls in past yr: 1 1 0 0 -  Injury with Fall? 0 0 1 0 -  Risk for fall due to : History of fall(s);Impaired balance/gait;Impaired mobility;Impaired vision History of fall(s) History of fall(s);Impaired balance/gait;Impaired mobility;Impaired vision - Other (Comment)  Risk for fall due to: Comment - - - - obesity  Follow up Falls prevention discussed Falls evaluation completed Falls prevention discussed;Education provided - Education provided    Any stairs in or around the home? No  If so, are there any without handrails? No  Home free of loose throw rugs in walkways, pet beds, electrical cords, etc? Yes  Adequate lighting in your home to reduce risk of falls? Yes   ASSISTIVE DEVICES UTILIZED TO PREVENT FALLS:  Life alert? No  Use of a cane, walker or w/c? Yes  Grab bars in the bathroom? Yes  Shower chair or bench in shower? Yes  Elevated toilet seat or a handicapped toilet? Yes   TIMED UP AND GO:  Was the test performed? Yes .  Length of time to ambulate 10 feet: 15 sec.   Gait slow and steady with assistive device  Cognitive Function:     6CIT Screen 07/24/2019 11/09/2016  What Year? 0 points 0 points  What month? 0 points 0 points  What time? 0 points 0 points  Count back from 20 0 points 0 points  Months in reverse 0 points 0 points  Repeat phrase 2 points 0 points  Total Score 2 0     Immunizations Immunization History  Administered Date(s) Administered  . Fluad Quad(high Dose 65+) 10/06/2018  . Influenza Split 09/18/2012  . Influenza Whole 12/27/2006, 11/01/2007, 11/08/2008, 10/29/2009, 12/02/2011  . Influenza, High Dose Seasonal PF 10/27/2016, 11/14/2017  . Influenza-Unspecified 12/19/2014  . Pneumococcal Conjugate-13 01/15/2013  . Pneumococcal Polysaccharide-23 01/13/2002, 04/16/2015  . Td 03/18/1996, 12/31/2009    TDAP status: Up to date Flu Vaccine status: Up to date Pneumococcal vaccine status: Up to date Covid-19 vaccine status: Completed vaccines  Qualifies for Shingles Vaccine? Yes   Zostavax completed No   Shingrix Completed?: No.    Education has been provided regarding the importance of this vaccine. Patient has been advised to call insurance company to determine out of pocket expense if they have not yet received this vaccine. Advised may also receive vaccine at local pharmacy or Health Dept. Verbalized acceptance and understanding.  Screening Tests Health Maintenance  Topic Date Due  . COVID-19 Vaccine (1) Never done  . INFLUENZA VACCINE  08/19/2019  . MAMMOGRAM  09/26/2019  . TETANUS/TDAP  01/01/2020  . DEXA SCAN  Completed  . PNA vac Low Risk  Adult  Completed    Health Maintenance  Health Maintenance Due  Topic Date Due  . COVID-19 Vaccine (1) Never done    Colorectal cancer screening: No longer required.  Mammogram status: Completed 09/26/18. Repeat every year Bone Density status: Completed 07/28/18. Results reflect: Bone density results: OSTEOPOROSIS. Repeat every 2 years.    Additional Screening:   Vision Screening: Recommended annual ophthalmology exams for early detection of glaucoma and other disorders of the eye. Is the patient up to date with their annual eye exam?  Yes  Who is the provider or what is the name of the office in which the patient attends annual eye exams? Dr Gay Filler at [facility] eye   Dental Screening:  Recommended annual dental exams for proper oral hygiene  Community Resource Referral / Chronic Care Management: CRR required this visit?  No   CCM required this visit?  No      Plan:     I have personally reviewed and noted the following in the patient's chart:   . Medical and social history . Use of alcohol, tobacco or illicit drugs  . Current medications and supplements . Functional ability and status . Nutritional status . Physical activity . Advanced directives . List of other physicians . Hospitalizations, surgeries, and ER visits in previous 12 months . Vitals . Screenings to include cognitive, depression, and falls . Referrals and appointments  In addition, I have reviewed and discussed with patient certain preventive protocols, quality metrics, and best practice recommendations. A written personalized care plan for preventive services as well as general preventive health recommendations were provided to patient.     Willette Brace, LPN   08/18/8561   Nurse Notes: None

## 2019-07-24 NOTE — Patient Instructions (Addendum)
Anna Gamble , Thank you for taking time to come for your Medicare Wellness Visit. I appreciate your ongoing commitment to your health goals. Please review the following plan we discussed and let me know if I can assist you in the future.   Screening recommendations/referrals: Colonoscopy: Done 01/08/2019 Mammogram: Done 09/26/2018 Bone Density: Done 07/28/18 Recommended yearly ophthalmology/optometry visit for glaucoma screening and checkup Recommended yearly dental visit for hygiene and checkup  Vaccinations: Influenza vaccine: Up to date Pneumococcal vaccine: Up to date Tdap vaccine: Done 12/31/2009  Shingles vaccine: Shingrix discussed. Please contact your pharmacy for coverage information.    Covid-19:Patient stated completion in 1/ &02/2019  Advanced directives: Advance directive discussed with you today. Even though you declined this today please call our office should you change your mind and we can give you the proper paperwork for you to fill out.  Conditions/risks identified: Goal to walk without cane  Next appointment: Follow up in one year for your annual wellness visit    Preventive Care 65 Years and Older, Female Preventive care refers to lifestyle choices and visits with your health care provider that can promote health and wellness. What does preventive care include?  A yearly physical exam. This is also called an annual well check.  Dental exams once or twice a year.  Routine eye exams. Ask your health care provider how often you should have your eyes checked.  Personal lifestyle choices, including:  Daily care of your teeth and gums.  Regular physical activity.  Eating a healthy diet.  Avoiding tobacco and drug use.  Limiting alcohol use.  Practicing safe sex.  Taking low-dose aspirin every day.  Taking vitamin and mineral supplements as recommended by your health care provider. What happens during an annual well check? The services and screenings  done by your health care provider during your annual well check will depend on your age, overall health, lifestyle risk factors, and family history of disease. Counseling  Your health care provider may ask you questions about your:  Alcohol use.  Tobacco use.  Drug use.  Emotional well-being.  Home and relationship well-being.  Sexual activity.  Eating habits.  History of falls.  Memory and ability to understand (cognition).  Work and work Statistician.  Reproductive health. Screening  You may have the following tests or measurements:  Height, weight, and BMI.  Blood pressure.  Lipid and cholesterol levels. These may be checked every 5 years, or more frequently if you are over 42 years old.  Skin check.  Lung cancer screening. You may have this screening every year starting at age 85 if you have a 30-pack-year history of smoking and currently smoke or have quit within the past 15 years.  Fecal occult blood test (FOBT) of the stool. You may have this test every year starting at age 69.  Flexible sigmoidoscopy or colonoscopy. You may have a sigmoidoscopy every 5 years or a colonoscopy every 10 years starting at age 46.  Hepatitis C blood test.  Hepatitis B blood test.  Sexually transmitted disease (STD) testing.  Diabetes screening. This is done by checking your blood sugar (glucose) after you have not eaten for a while (fasting). You may have this done every 1-3 years.  Bone density scan. This is done to screen for osteoporosis. You may have this done starting at age 29.  Mammogram. This may be done every 1-2 years. Talk to your health care provider about how often you should have regular mammograms. Talk with your health  care provider about your test results, treatment options, and if necessary, the need for more tests. Vaccines  Your health care provider may recommend certain vaccines, such as:  Influenza vaccine. This is recommended every year.  Tetanus,  diphtheria, and acellular pertussis (Tdap, Td) vaccine. You may need a Td booster every 10 years.  Zoster vaccine. You may need this after age 42.  Pneumococcal 13-valent conjugate (PCV13) vaccine. One dose is recommended after age 68.  Pneumococcal polysaccharide (PPSV23) vaccine. One dose is recommended after age 44. Talk to your health care provider about which screenings and vaccines you need and how often you need them. This information is not intended to replace advice given to you by your health care provider. Make sure you discuss any questions you have with your health care provider. Document Released: 01/31/2015 Document Revised: 09/24/2015 Document Reviewed: 11/05/2014 Elsevier Interactive Patient Education  2017 [place] Prevention in the Home Falls can cause injuries. They can happen to people of all ages. There are many things you can do to make your home safe and to help prevent falls. What can I do on the outside of my home?  Regularly fix the edges of walkways and driveways and fix any cracks.  Remove anything that might make you trip as you walk through a door, such as a raised step or threshold.  Trim any bushes or trees on the path to your home.  Use bright outdoor lighting.  Clear any walking paths of anything that might make someone trip, such as rocks or tools.  Regularly check to see if handrails are loose or broken. Make sure that both sides of any steps have handrails.  Any raised decks and porches should have guardrails on the edges.  Have any leaves, snow, or ice cleared regularly.  Use sand or salt on walking paths during winter.  Clean up any spills in your garage right away. This includes oil or grease spills. What can I do in the bathroom?  Use night lights.  Install grab bars by the toilet and in the tub and shower. Do not use towel bars as grab bars.  Use non-skid mats or decals in the tub or shower.  If you need to sit down in  the shower, use a plastic, non-slip stool.  Keep the floor dry. Clean up any water that spills on the floor as soon as it happens.  Remove soap buildup in the tub or shower regularly.  Attach bath mats securely with double-sided non-slip rug tape.  Do not have throw rugs and other things on the floor that can make you trip. What can I do in the bedroom?  Use night lights.  Make sure that you have a light by your bed that is easy to reach.  Do not use any sheets or blankets that are too big for your bed. They should not hang down onto the floor.  Have a firm chair that has side arms. You can use this for support while you get dressed.  Do not have throw rugs and other things on the floor that can make you trip. What can I do in the kitchen?  Clean up any spills right away.  Avoid walking on wet floors.  Keep items that you use a lot in easy-to-reach places.  If you need to reach something above you, use a strong step stool that has a grab bar.  Keep electrical cords out of the way.  Do not use floor  polish or wax that makes floors slippery. If you must use wax, use non-skid floor wax.  Do not have throw rugs and other things on the floor that can make you trip. What can I do with my stairs?  Do not leave any items on the stairs.  Make sure that there are handrails on both sides of the stairs and use them. Fix handrails that are broken or loose. Make sure that handrails are as long as the stairways.  Check any carpeting to make sure that it is firmly attached to the stairs. Fix any carpet that is loose or worn.  Avoid having throw rugs at the top or bottom of the stairs. If you do have throw rugs, attach them to the floor with carpet tape.  Make sure that you have a light switch at the top of the stairs and the bottom of the stairs. If you do not have them, ask someone to add them for you. What else can I do to help prevent falls?  Wear shoes that:  Do not have high  heels.  Have rubber bottoms.  Are comfortable and fit you well.  Are closed at the toe. Do not wear sandals.  If you use a stepladder:  Make sure that it is fully opened. Do not climb a closed stepladder.  Make sure that both sides of the stepladder are locked into place.  Ask someone to hold it for you, if possible.  Clearly mark and make sure that you can see:  Any grab bars or handrails.  First and last steps.  Where the edge of each step is.  Use tools that help you move around (mobility aids) if they are needed. These include:  Canes.  Walkers.  Scooters.  Crutches.  Turn on the lights when you go into a dark area. Replace any light bulbs as soon as they burn out.  Set up your furniture so you have a clear path. Avoid moving your furniture around.  If any of your floors are uneven, fix them.  If there are any pets around you, be aware of where they are.  Review your medicines with your doctor. Some medicines can make you feel dizzy. This can increase your chance of falling. Ask your doctor what other things that you can do to help prevent falls. This information is not intended to replace advice given to you by your health care provider. Make sure you discuss any questions you have with your health care provider. Document Released: 10/31/2008 Document Revised: 06/12/2015 Document Reviewed: 02/08/2014 Elsevier Interactive Patient Education  2017 [employer].

## 2019-08-06 DIAGNOSIS — H5203 Hypermetropia, bilateral: Secondary | ICD-10-CM | POA: Diagnosis not present

## 2019-08-06 DIAGNOSIS — H16141 Punctate keratitis, right eye: Secondary | ICD-10-CM | POA: Diagnosis not present

## 2019-08-06 DIAGNOSIS — H524 Presbyopia: Secondary | ICD-10-CM | POA: Diagnosis not present

## 2019-08-06 DIAGNOSIS — H52223 Regular astigmatism, bilateral: Secondary | ICD-10-CM | POA: Diagnosis not present

## 2019-09-26 ENCOUNTER — Other Ambulatory Visit: Payer: Self-pay | Admitting: Cardiology

## 2019-10-03 DIAGNOSIS — R350 Frequency of micturition: Secondary | ICD-10-CM | POA: Diagnosis not present

## 2019-10-03 DIAGNOSIS — N3946 Mixed incontinence: Secondary | ICD-10-CM | POA: Diagnosis not present

## 2019-10-06 ENCOUNTER — Other Ambulatory Visit: Payer: Self-pay | Admitting: Cardiology

## 2019-10-17 DIAGNOSIS — Z1231 Encounter for screening mammogram for malignant neoplasm of breast: Secondary | ICD-10-CM | POA: Diagnosis not present

## 2019-10-22 ENCOUNTER — Other Ambulatory Visit: Payer: Self-pay | Admitting: Cardiology

## 2019-10-30 NOTE — Progress Notes (Signed)
HPI: FU aortic stenosis/AVR. Placed on beta blocker previously secondary to PVCs.Found to have severe aortic stenosis on follow-up echocardiogram March 2020. Cardiac catheterization revealed a 40% proximal right coronary artery and 20% ostial left main. Carotid Dopplers May 2020 showed 1 to 39% bilateral stenosis. Had TAVR May 2020. Monitor June 2020 showed sinus rhythm with occasional PAC, PVC, couplet and brief PAT. Most recent echocardiogram May 2021 showed normal LV function, mild left ventricular hypertrophy, grade 1 diastolic dysfunction, previous aortic valve replacement with mean gradient 13.7 mmHg.  No aortic insufficiency noted.  Since last seen,she has dyspnea on exertion unchanged.  No orthopnea or PND.  No pedal edema.  No chest pain or syncope.  Current Outpatient Medications  Medication Sig Dispense Refill  . aspirin 81 MG tablet Take 81 mg by mouth daily.    . cholecalciferol (VITAMIN D) 1000 UNITS tablet Take 1,000 Units by mouth daily.      . hydrochlorothiazide (MICROZIDE) 12.5 MG capsule TAKE 1 CAPSULE BY MOUTH  DAILY 90 capsule 3  . Lancets (ACCU-CHEK SOFT TOUCH) lancets Use as instructed 100 each 12  . losartan (COZAAR) 25 MG tablet TAKE 2 TABLETS BY MOUTH  DAILY 180 tablet 3  . Menthol, Topical Analgesic, (BENGAY EX) Apply 1 application topically daily as needed (back pain).    . metFORMIN (GLUCOPHAGE) 500 MG tablet Take 1 tablet (500 mg total) by mouth daily. 90 tablet 3  . metoprolol succinate (TOPROL-XL) 50 MG 24 hr tablet TAKE 1 TABLET BY MOUTH  DAILY 90 tablet 3  . Misc Natural Products (TART CHERRY ADVANCED PO) Take 1 capsule by mouth daily.     . Multiple Vitamin (MULTIVITAMINS PO) Take 1 tablet by mouth daily.     . Omega-3 1000 MG CAPS Take 1,000 mg by mouth every morning.     . pantoprazole (PROTONIX) 40 MG tablet Take 1 tablet (40 mg total) by mouth daily. 90 tablet 1  . Polyethyl Glycol-Propyl Glycol (SYSTANE) 0.4-0.3 % GEL ophthalmic gel Place 1  application into both eyes 2 (two) times daily.     No current facility-administered medications for this visit.     Past Medical History:  Diagnosis Date  . CAD (coronary artery disease) 03/19/2014  . Colon polyp   . Coronary artery disease   . GERD (gastroesophageal reflux disease)   . Hx of colonoscopy with polypectomy 07/14/2012   medoff   . Hyperlipidemia   . Hypertension   . Impaired fasting glucose   . Morbid obesity ([place])   . S/P TAVR (transcatheter aortic valve replacement)   . Severe aortic stenosis     Past Surgical History:  Procedure Laterality Date  . APPENDECTOMY    . CHOLECYSTECTOMY    . FOOT SURGERY     rt  . KNEE SURGERY     rt  . LEFT AND RIGHT HEART CATHETERIZATION WITH CORONARY ANGIOGRAM N/A 10/10/2013   Procedure: LEFT AND RIGHT HEART CATHETERIZATION WITH CORONARY ANGIOGRAM;  Surgeon: Burnell Blanks, MD;  Location: [facility] CATH LAB;  Service: Cardiovascular;  Laterality: N/A;  . RIGHT/LEFT HEART CATH AND CORONARY ANGIOGRAPHY N/A 04/06/2018   Procedure: RIGHT/LEFT HEART CATH AND CORONARY ANGIOGRAPHY;  Surgeon: Burnell Blanks, MD;  Location: [place] CV LAB;  Service: Cardiovascular;  Laterality: N/A;  . TEE WITHOUT CARDIOVERSION N/A 06/06/2018   Procedure: TRANSESOPHAGEAL ECHOCARDIOGRAM (TEE);  Surgeon: Burnell Blanks, MD;  Location: [place];  Service: Open Heart Surgery;  Laterality: N/A;  . TONSILLECTOMY    .  TOTAL KNEE ARTHROPLASTY  02/01/2011   Procedure: TOTAL KNEE ARTHROPLASTY;  Surgeon: Kerin Salen;  Location: [place];  Service: Orthopedics;  Laterality: Right;  DEPUY/SIGMA  . TRANSCATHETER AORTIC VALVE REPLACEMENT, TRANSFEMORAL N/A 06/06/2018   Procedure: TRANSCATHETER AORTIC VALVE REPLACEMENT, TRANSFEMORAL;  Surgeon: Burnell Blanks, MD;  Location: [place];  Service: Open Heart Surgery;  Laterality: N/A;    Social History   Socioeconomic History  . Marital status: Married    Spouse name: Not on file  . Number of  children: 4  . Years of education: Not on file  . Highest education level: Not on file  Occupational History  . Occupation: Retired    Comment: Takes care of [employer]  . Occupation: Therapist, sports, peds, cardiology    Comment: retired  Tobacco Use  . Smoking status: Never Smoker  . Smokeless tobacco: Never Used  Vaping Use  . Vaping Use: Never used  Substance and Sexual Activity  . Alcohol use: No  . Drug use: No  . Sexual activity: Not Currently    Comment: quit 40 yrs ago  Other Topics Concern  . Not on file  Social History Narrative   hh of 3 -4  Married   [place] son   At home    Invalid son paraplegia and husband    And grandson recently     No pets   Fluctuating hh membors she cooks and prepares for them          Social Determinants of Health   Financial Resource Strain: Low Risk   . Difficulty of Paying Living Expenses: Not hard at all  Food Insecurity: No Food Insecurity  . Worried About Charity fundraiser in the Last Year: Never true  . Ran Out of Food in the Last Year: Never true  Transportation Needs: No Transportation Needs  . Lack of Transportation (Medical): No  . Lack of Transportation (Non-Medical): No  Physical Activity: Insufficiently Active  . Days of Exercise per Week: 4 days  . Minutes of Exercise per Session: 10 min  Stress: No Stress Concern Present  . Feeling of Stress : Not at all  Social Connections: Moderately Integrated  . Frequency of Communication with Friends and Family: More than three times a week  . Frequency of Social Gatherings with Friends and Family: Once a week  . Attends Religious Services: More than 4 times per year  . Active Member of Clubs or Organizations: No  . Attends Archivist Meetings: Never  . Marital Status: Married  Human resources officer Violence: Not At Risk  . Fear of Current or Ex-Partner: No  . Emotionally Abused: No  . Physically Abused: No  . Sexually Abused: No    Family History  Problem Relation Age  of Onset  . Coronary artery disease Other        female- 1st degree relative  . Coronary artery disease Other        female- 1st degree relative  . Hypertension Other   . Hyperlipidemia Other   . Heart attack Mother        age 67's  . Heart attack Father        age 60's  . Stroke Brother        died   . Hearing loss Son   . Sleep apnea Son   . Aortic stenosis Sister   . Stroke Brother   . Heart attack Brother   . Rheum arthritis Sister   .  Osteoarthritis Sister   . Arthritis Sister   . Heart Problems Brother     ROS: no fevers or chills, productive cough, hemoptysis, dysphasia, odynophagia, melena, hematochezia, dysuria, hematuria, rash, seizure activity, orthopnea, PND, pedal edema, claudication. Remaining systems are negative.  Physical Exam: Well-developed obese in no acute distress.  Skin is warm and dry.  HEENT is normal.  Neck is supple.  Chest is clear to auscultation with normal expansion.  Cardiovascular exam is regular rate and rhythm.  2/6 systolic murmur left sternal border.  No diastolic murmur. Abdominal exam nontender or distended. No masses palpated. Extremities show no edema. neuro grossly intact  A/P  1 S/P TAVR-continue SBE prophylaxis.  2 dyspnea-this is felt to be multifactorial including obesity hypoventilation syndrome and deconditioning.  3 hypertension-patient's blood pressure is controlled today.  Continue present medications and follow.  4 hyperlipidemia-continue statin.  5 coronary artery disease-mild on previous cardiac catheterization prior to TAVR.  Continue medical therapy with aspirin and statin.  6 palpitations-symptoms are controlled.  Continue beta-blocker at present dose.  7 morbid obesity-needs diet, exercise and weight loss.  Kirk Ruths, MD

## 2019-11-02 ENCOUNTER — Encounter: Payer: Self-pay | Admitting: Cardiology

## 2019-11-02 ENCOUNTER — Ambulatory Visit (INDEPENDENT_AMBULATORY_CARE_PROVIDER_SITE_OTHER): Payer: Medicare Other | Admitting: Cardiology

## 2019-11-02 ENCOUNTER — Other Ambulatory Visit: Payer: Self-pay

## 2019-11-02 VITALS — BP 132/74 | HR 76 | Ht 62.0 in | Wt 235.4 lb

## 2019-11-02 DIAGNOSIS — R002 Palpitations: Secondary | ICD-10-CM

## 2019-11-02 DIAGNOSIS — I10 Essential (primary) hypertension: Secondary | ICD-10-CM

## 2019-11-02 DIAGNOSIS — Z952 Presence of prosthetic heart valve: Secondary | ICD-10-CM | POA: Diagnosis not present

## 2019-11-02 DIAGNOSIS — E7800 Pure hypercholesterolemia, unspecified: Secondary | ICD-10-CM | POA: Diagnosis not present

## 2019-11-02 NOTE — Patient Instructions (Signed)

## 2020-01-10 ENCOUNTER — Ambulatory Visit (INDEPENDENT_AMBULATORY_CARE_PROVIDER_SITE_OTHER): Payer: Medicare Other | Admitting: *Deleted

## 2020-01-10 ENCOUNTER — Other Ambulatory Visit: Payer: Self-pay

## 2020-01-10 DIAGNOSIS — Z23 Encounter for immunization: Secondary | ICD-10-CM

## 2020-02-25 ENCOUNTER — Other Ambulatory Visit: Payer: Self-pay | Admitting: Internal Medicine

## 2020-02-28 DIAGNOSIS — N3946 Mixed incontinence: Secondary | ICD-10-CM | POA: Diagnosis not present

## 2020-02-28 DIAGNOSIS — R350 Frequency of micturition: Secondary | ICD-10-CM | POA: Diagnosis not present

## 2020-04-19 ENCOUNTER — Other Ambulatory Visit: Payer: Self-pay | Admitting: Cardiology

## 2020-05-16 ENCOUNTER — Other Ambulatory Visit: Payer: Self-pay | Admitting: Internal Medicine

## 2020-05-26 ENCOUNTER — Other Ambulatory Visit: Payer: Self-pay

## 2020-05-26 NOTE — Progress Notes (Signed)
Chief Complaint  Patient presents with  . Diabetes  . Leg Injury    Patient complains of pain in left leg, Patient states she was seen by orthopedics and they stated she has torn ligaments in her leg.     HPI: Anna Gamble 85 y.o. come in for Chronic disease management last seen by me a year ago has seen urologist health coach cardiology and ortho   Her last visit with me was April 2021 in regard to her blood sugar early diabetes had been doing pretty well with control until her left knee started acting up feeling like she had a ligament problem.  Dr. Alfonso Ramus recommended knee replacement which she was not amenable to has been battling pain on the left and posterior side of her knee left knee  Her blood sugars were pretty good but have crept up felt because of her inactivity. Lack of exercise  Since knee issues  This am 130    115 - 170.  Post eating.  HH of 3  of 3  .  No cp sob falling    ROS: See pertinent positives and negatives per HPI.  Past Medical History:  Diagnosis Date  . CAD (coronary artery disease) 03/19/2014  . Colon polyp   . Coronary artery disease   . GERD (gastroesophageal reflux disease)   . Hx of colonoscopy with polypectomy 07/14/2012   medoff   . Hyperlipidemia   . Hypertension   . Impaired fasting glucose   . Morbid obesity ([place])   . S/P TAVR (transcatheter aortic valve replacement)   . Severe aortic stenosis     Family History  Problem Relation Age of Onset  . Coronary artery disease Other        female- 1st degree relative  . Coronary artery disease Other        female- 1st degree relative  . Hypertension Other   . Hyperlipidemia Other   . Heart attack Mother        age 31's  . Heart attack Father        age 73's  . Stroke Brother        died   . Hearing loss Son   . Sleep apnea Son   . Aortic stenosis Sister   . Stroke Brother   . Heart attack Brother   . Rheum arthritis Sister   . Osteoarthritis Sister   . Arthritis Sister   .  Heart Problems Brother     Social History   Socioeconomic History  . Marital status: Married    Spouse name: Not on file  . Number of children: 4  . Years of education: Not on file  . Highest education level: Not on file  Occupational History  . Occupation: Retired    Comment: Takes care of [employer]  . Occupation: Therapist, sports, peds, cardiology    Comment: retired  Tobacco Use  . Smoking status: Never Smoker  . Smokeless tobacco: Never Used  Vaping Use  . Vaping Use: Never used  Substance and Sexual Activity  . Alcohol use: No  . Drug use: No  . Sexual activity: Not Currently    Comment: quit 40 yrs ago  Other Topics Concern  . Not on file  Social History Narrative   hh of 3 -4  Married   [place] son   At home    Invalid son paraplegia and husband    And grandson recently     No pets  Fluctuating hh membors she cooks and prepares for them          Social Determinants of Health   Financial Resource Strain: Low Risk   . Difficulty of Paying Living Expenses: Not hard at all  Food Insecurity: No Food Insecurity  . Worried About Charity fundraiser in the Last Year: Never true  . Ran Out of Food in the Last Year: Never true  Transportation Needs: No Transportation Needs  . Lack of Transportation (Medical): No  . Lack of Transportation (Non-Medical): No  Physical Activity: Insufficiently Active  . Days of Exercise per Week: 4 days  . Minutes of Exercise per Session: 10 min  Stress: No Stress Concern Present  . Feeling of Stress : Not at all  Social Connections: Moderately Integrated  . Frequency of Communication with Friends and Family: More than three times a week  . Frequency of Social Gatherings with Friends and Family: Once a week  . Attends Religious Services: More than 4 times per year  . Active Member of Clubs or Organizations: No  . Attends Archivist Meetings: Never  . Marital Status: Married    Outpatient Medications Prior to Visit  Medication  Sig Dispense Refill  . aspirin 81 MG tablet Take 81 mg by mouth daily.    . cholecalciferol (VITAMIN D) 1000 UNITS tablet Take 1,000 Units by mouth daily.    . hydrochlorothiazide (MICROZIDE) 12.5 MG capsule TAKE 1 CAPSULE BY MOUTH  DAILY 90 capsule 1  . Lancets (ACCU-CHEK SOFT TOUCH) lancets Use as instructed 100 each 12  . losartan (COZAAR) 25 MG tablet TAKE 2 TABLETS BY MOUTH  DAILY 180 tablet 1  . Menthol, Topical Analgesic, (BENGAY EX) Apply 1 application topically daily as needed (back pain).    . metoprolol succinate (TOPROL-XL) 50 MG 24 hr tablet TAKE 1 TABLET BY MOUTH  DAILY 90 tablet 3  . Misc Natural Products (TART CHERRY ADVANCED PO) Take 1 capsule by mouth daily.     . Multiple Vitamin (MULTIVITAMINS PO) Take 1 tablet by mouth daily.    . Omega-3 1000 MG CAPS Take 1,000 mg by mouth every morning.     . pantoprazole (PROTONIX) 40 MG tablet Take 1 tablet (40 mg total) by mouth daily. 90 tablet 1  . Polyethyl Glycol-Propyl Glycol (SYSTANE) 0.4-0.3 % GEL ophthalmic gel Place 1 application into both eyes 2 (two) times daily.    . metFORMIN (GLUCOPHAGE) 500 MG tablet TAKE 1 TABLET BY MOUTH  DAILY 30 tablet 0   No facility-administered medications prior to visit.     EXAM:  BP 120/68 (BP Location: Left Arm, Patient Position: Sitting, Cuff Size: Large)   Pulse 67   Temp 98 F (36.7 C) (Oral)   Ht 5\' 2"  (1.575 m)   Wt 234 lb 3.2 oz (106.2 kg)   SpO2 96%   BMI 42.84 kg/m   Body mass index is 42.84 kg/m. Wt Readings from Last 3 Encounters:  05/27/20 234 lb 3.2 oz (106.2 kg)  11/02/19 235 lb 6.4 oz (106.8 kg)  07/24/19 234 lb 6.4 oz (106.3 kg)    GENERAL: vitals reviewed and listed above, alert, oriented, appears well hydrated and in no acute distress HEENT: atraumatic, conjunctiva  clear, no obvious abnormalities on inspection of external nose and ears OP :masked  NECK: no obvious masses on inspection palpation  LUNGS: clear to auscultation bilaterally, no wheezes, rales  or rhonchi, good air movement CV: HRRR, no clubbing cyanosis slight  peripheral edema nl cap refill  MS: moves all extremities without noticeable focal  Abnormality cane  fabors left knee no  warth  Otherwise independent gai  PSYCH: pleasant and cooperative, no obvious depression or anxiety Lab Results  Component Value Date   WBC 5.3 10/06/2018   HGB 13.9 10/06/2018   HCT 41.1 10/06/2018   PLT 248.0 10/06/2018   GLUCOSE 176 (H) 04/19/2019   CHOL 182 04/19/2019   TRIG 243 (H) 04/19/2019   HDL 61 04/19/2019   LDLDIRECT 89.0 06/15/2016   LDLCALC 81 04/19/2019   ALT 28 04/19/2019   AST 23 04/19/2019   NA 138 04/19/2019   K 4.4 04/19/2019   CL 99 04/19/2019   CREATININE 0.85 04/19/2019   BUN 14 04/19/2019   CO2 25 04/19/2019   TSH 1.05 04/16/2015   INR 1.0 06/02/2018   HGBA1C 6.3 (A) 05/01/2019   MICROALBUR 2.6 (H) 10/06/2018   BP Readings from Last 3 Encounters:  05/27/20 120/68  11/02/19 132/74  07/24/19 110/68  scrambled eg bacon and 1/2 toast  And diet coke  This am   ASSESSMENT AND PLAN:  Discussed the following assessment and plan:  Fasting hyperglycemia early dm by a1c  - Plan: Basic metabolic panel, CBC with Differential/Platelet, Hemoglobin A1c, Hepatic function panel, Lipid panel, Microalbumin / creatinine urine ratio, Lipid panel, Hepatic function panel, Hemoglobin A1c, CBC with Differential/Platelet, Basic metabolic panel, Microalbumin / creatinine urine ratio  Essential hypertension - Plan: Basic metabolic panel, CBC with Differential/Platelet, Hemoglobin A1c, Hepatic function panel, Lipid panel, Microalbumin / creatinine urine ratio, Lipid panel, Hepatic function panel, Hemoglobin A1c, CBC with Differential/Platelet, Basic metabolic panel, Microalbumin / creatinine urine ratio  Medication management - Plan: Basic metabolic panel, CBC with Differential/Platelet, Hemoglobin A1c, Hepatic function panel, Lipid panel, Microalbumin / creatinine urine ratio, Lipid panel,  Hepatic function panel, Hemoglobin A1c, CBC with Differential/Platelet, Basic metabolic panel, Microalbumin / creatinine urine ratio  Morbid obesity (H[facility]) - Plan: Basic metabolic panel, CBC with Differential/Platelet, Hemoglobin A1c, Hepatic function panel, Lipid panel, Microalbumin / creatinine urine ratio, Lipid panel, Hepatic function panel, Hemoglobin A1c, CBC with Differential/Platelet, Basic metabolic panel, Microalbumin / creatinine urine ratio  S/P TAVR (transcatheter aortic valve replacement) - Plan: Basic metabolic panel, CBC with Differential/Platelet, Hemoglobin A1c, Hepatic function panel, Lipid panel, Microalbumin / creatinine urine ratio, Lipid panel, Hepatic function panel, Hemoglobin A1c, CBC with Differential/Platelet, Basic metabolic panel, Microalbumin / creatinine urine ratio  Chronic pain of left knee - has seen DR Brand Males for labs monitoring will do today  Post breakfast   Record review  Refill metformin 500 per day consider adding other as indicated  Counseled.  consider if pt will help pain and mobility  For knee -Patient advised to return or notify health care team  if  new concerns arise.  Patient Instructions  Will notify you  of labs when available.   Keep working  on sugar and weight.    Plan follow up  6 months or as needed   [name]. Larri Yehle M.D.

## 2020-05-27 ENCOUNTER — Encounter: Payer: Self-pay | Admitting: Internal Medicine

## 2020-05-27 ENCOUNTER — Ambulatory Visit (INDEPENDENT_AMBULATORY_CARE_PROVIDER_SITE_OTHER): Payer: Medicare Other | Admitting: Internal Medicine

## 2020-05-27 VITALS — BP 120/68 | HR 67 | Temp 98.0°F | Ht 62.0 in | Wt 234.2 lb

## 2020-05-27 DIAGNOSIS — R7301 Impaired fasting glucose: Secondary | ICD-10-CM | POA: Diagnosis not present

## 2020-05-27 DIAGNOSIS — G8929 Other chronic pain: Secondary | ICD-10-CM

## 2020-05-27 DIAGNOSIS — Z79899 Other long term (current) drug therapy: Secondary | ICD-10-CM | POA: Diagnosis not present

## 2020-05-27 DIAGNOSIS — M25562 Pain in left knee: Secondary | ICD-10-CM

## 2020-05-27 DIAGNOSIS — I10 Essential (primary) hypertension: Secondary | ICD-10-CM

## 2020-05-27 DIAGNOSIS — Z952 Presence of prosthetic heart valve: Secondary | ICD-10-CM | POA: Diagnosis not present

## 2020-05-27 LAB — CBC WITH DIFFERENTIAL/PLATELET
Basophils Absolute: 0 10*3/uL (ref 0.0–0.1)
Basophils Relative: 0.5 % (ref 0.0–3.0)
Eosinophils Absolute: 0.3 10*3/uL (ref 0.0–0.7)
Eosinophils Relative: 4.3 % (ref 0.0–5.0)
HCT: 40.1 % (ref 36.0–46.0)
Hemoglobin: 13.8 g/dL (ref 12.0–15.0)
Lymphocytes Relative: 36.1 % (ref 12.0–46.0)
Lymphs Abs: 2.5 10*3/uL (ref 0.7–4.0)
MCHC: 34.4 g/dL (ref 30.0–36.0)
MCV: 95.9 fl (ref 78.0–100.0)
Monocytes Absolute: 0.7 10*3/uL (ref 0.1–1.0)
Monocytes Relative: 10.5 % (ref 3.0–12.0)
Neutro Abs: 3.4 10*3/uL (ref 1.4–7.7)
Neutrophils Relative %: 48.6 % (ref 43.0–77.0)
Platelets: 251 10*3/uL (ref 150.0–400.0)
RBC: 4.18 Mil/uL (ref 3.87–5.11)
RDW: 13.5 % (ref 11.5–15.5)
WBC: 7 10*3/uL (ref 4.0–10.5)

## 2020-05-27 LAB — HEPATIC FUNCTION PANEL
ALT: 32 U/L (ref 0–35)
AST: 22 U/L (ref 0–37)
Albumin: 4.2 g/dL (ref 3.5–5.2)
Alkaline Phosphatase: 70 U/L (ref 39–117)
Bilirubin, Direct: 0.1 mg/dL (ref 0.0–0.3)
Total Bilirubin: 0.6 mg/dL (ref 0.2–1.2)
Total Protein: 6.9 g/dL (ref 6.0–8.3)

## 2020-05-27 LAB — BASIC METABOLIC PANEL
BUN: 15 mg/dL (ref 6–23)
CO2: 32 mEq/L (ref 19–32)
Calcium: 9.9 mg/dL (ref 8.4–10.5)
Chloride: 100 mEq/L (ref 96–112)
Creatinine, Ser: 1.01 mg/dL (ref 0.40–1.20)
GFR: 51.76 mL/min — ABNORMAL LOW (ref >60.00)
Glucose, Bld: 124 mg/dL — ABNORMAL HIGH (ref 70–99)
Potassium: 4.5 mEq/L (ref 3.5–5.1)
Sodium: 138 mEq/L (ref 135–145)

## 2020-05-27 LAB — HEMOGLOBIN A1C: Hgb A1c MFr Bld: 7.3 % — ABNORMAL HIGH (ref 4.6–6.5)

## 2020-05-27 LAB — LIPID PANEL
Cholesterol: 261 mg/dL — ABNORMAL HIGH (ref 0–200)
HDL: 54.9 mg/dL (ref >39.00)
NonHDL: 205.79
Total CHOL/HDL Ratio: 5
Triglycerides: 347 mg/dL — ABNORMAL HIGH (ref 0.0–149.0)
VLDL: 69.4 mg/dL — ABNORMAL HIGH (ref 0.0–40.0)

## 2020-05-27 LAB — LDL CHOLESTEROL, DIRECT: Direct LDL: 171 mg/dL

## 2020-05-27 LAB — MICROALBUMIN / CREATININE URINE RATIO
Creatinine,U: 143 mg/dL
Microalb Creat Ratio: 0.6 mg/g (ref 0.0–30.0)
Microalb, Ur: 0.9 mg/dL (ref 0.0–1.9)

## 2020-05-27 MED ORDER — METFORMIN HCL 500 MG PO TABS: 1.0000 | ORAL_TABLET | Freq: Every day | ORAL | 3 refills | Status: DC

## 2020-05-27 NOTE — Patient Instructions (Signed)
Will notify you  of labs when available.   Keep working  on sugar and weight.    Plan follow up  6 months or as needed

## 2020-05-28 NOTE — Progress Notes (Signed)
So everything is stable but yes your A1c is up to 7.3.  Can tincrease the metformin to 2 a day. If you tolerate we can send in more refills.  If it gives you a stomachache then go back down to 1 a day or 500 mg keep a follow-up visit in about 6 months.    Avoiding simple sugars again and paying attention to diet might be as good as increasing the dose of medicine.

## 2020-07-29 ENCOUNTER — Other Ambulatory Visit: Payer: Self-pay

## 2020-07-29 ENCOUNTER — Ambulatory Visit (INDEPENDENT_AMBULATORY_CARE_PROVIDER_SITE_OTHER): Payer: Medicare Other

## 2020-07-29 DIAGNOSIS — Z0000 Encounter for general adult medical examination without abnormal findings: Secondary | ICD-10-CM | POA: Diagnosis not present

## 2020-07-29 NOTE — Patient Instructions (Signed)
[name] , Thank you for taking time to come for your Medicare Wellness Visit. I appreciate your ongoing commitment to your health goals. Please review the following plan we discussed and let me know if I can assist you in the future.   Screening recommendations/referrals: Colonoscopy: Done 01/18/19 per provider  Mammogram: Done 10/17/19 repeat every year Bone Density: Done 07/28/18 repeat every 2 years Recommended yearly ophthalmology/optometry visit for glaucoma screening and checkup Recommended yearly dental visit for hygiene and checkup  Vaccinations: Influenza vaccine: Due 08/18/20 Pneumococcal vaccine: Completed  Tdap vaccine: due and discussed Shingles vaccine: Shingrix discussed. Please contact your pharmacy for coverage information.    Covid-19:Pt stated completed will bring in or call with dates  Advanced directives: Please bring a copy of your health care power of attorney and living will to the office at your convenience.  Conditions/risks identified: Lose weight   Next appointment: Follow up in one year for your annual wellness visit    Preventive Care 65 Years and Older, Female Preventive care refers to lifestyle choices and visits with your health care provider that can promote health and wellness. What does preventive care include? A yearly physical exam. This is also called an annual well check. Dental exams once or twice a year. Routine eye exams. Ask your health care provider how often you should have your eyes checked. Personal lifestyle choices, including: Daily care of your teeth and gums. Regular physical activity. Eating a healthy diet. Avoiding tobacco and drug use. Limiting alcohol use. Practicing safe sex. Taking low-dose aspirin every day. Taking vitamin and mineral supplements as recommended by your health care provider. What happens during an annual well check? The services and screenings done by your health care provider during your annual well  check will depend on your age, overall health, lifestyle risk factors, and family history of disease. Counseling  Your health care provider may ask you questions about your: Alcohol use. Tobacco use. Drug use. Emotional well-being. Home and relationship well-being. Sexual activity. Eating habits. History of falls. Memory and ability to understand (cognition). Work and work Statistician. Reproductive health. Screening  You may have the following tests or measurements: Height, weight, and BMI. Blood pressure. Lipid and cholesterol levels. These may be checked every 5 years, or more frequently if you are over 4 years old. Skin check. Lung cancer screening. You may have this screening every year starting at age 50 if you have a 30-pack-year history of smoking and currently smoke or have quit within the past 15 years. Fecal occult blood test (FOBT) of the stool. You may have this test every year starting at age 81. Flexible sigmoidoscopy or colonoscopy. You may have a sigmoidoscopy every 5 years or a colonoscopy every 10 years starting at age 85. Hepatitis C blood test. Hepatitis B blood test. Sexually transmitted disease (STD) testing. Diabetes screening. This is done by checking your blood sugar (glucose) after you have not eaten for a while (fasting). You may have this done every 1-3 years. Bone density scan. This is done to screen for osteoporosis. You may have this done starting at age 72. Mammogram. This may be done every 1-2 years. Talk to your health care provider about how often you should have regular mammograms. Talk with your health care provider about your test results, treatment options, and if necessary, the need for more tests. Vaccines  Your health care provider may recommend certain vaccines, such as: Influenza vaccine. This is recommended every year. Tetanus, diphtheria, and acellular pertussis (  Tdap, Td) vaccine. You may need a Td booster every 10 years. Zoster  vaccine. You may need this after age 44. Pneumococcal 13-valent conjugate (PCV13) vaccine. One dose is recommended after age 67. Pneumococcal polysaccharide (PPSV23) vaccine. One dose is recommended after age 60. Talk to your health care provider about which screenings and vaccines you need and how often you need them. This information is not intended to replace advice given to you by your health care provider. Make sure you discuss any questions you have with your health care provider. Document Released: 01/31/2015 Document Revised: 09/24/2015 Document Reviewed: 11/05/2014 Elsevier Interactive Patient Education  2017 [employer] Prevention in the Home Falls can cause injuries. They can happen to people of all ages. There are many things you can do to make your home safe and to help prevent falls. What can I do on the outside of my home? Regularly fix the edges of walkways and driveways and fix any cracks. Remove anything that might make you trip as you walk through a door, such as a raised step or threshold. Trim any bushes or trees on the path to your home. Use bright outdoor lighting. Clear any walking paths of anything that might make someone trip, such as rocks or tools. Regularly check to see if handrails are loose or broken. Make sure that both sides of any steps have handrails. Any raised decks and porches should have guardrails on the edges. Have any leaves, snow, or ice cleared regularly. Use sand or salt on walking paths during winter. Clean up any spills in your garage right away. This includes oil or grease spills. What can I do in the bathroom? Use night lights. Install grab bars by the toilet and in the tub and shower. Do not use towel bars as grab bars. Use non-skid mats or decals in the tub or shower. If you need to sit down in the shower, use a plastic, non-slip stool. Keep the floor dry. Clean up any water that spills on the floor as soon as it happens. Remove  soap buildup in the tub or shower regularly. Attach bath mats securely with double-sided non-slip rug tape. Do not have throw rugs and other things on the floor that can make you trip. What can I do in the bedroom? Use night lights. Make sure that you have a light by your bed that is easy to reach. Do not use any sheets or blankets that are too big for your bed. They should not hang down onto the floor. Have a firm chair that has side arms. You can use this for support while you get dressed. Do not have throw rugs and other things on the floor that can make you trip. What can I do in the kitchen? Clean up any spills right away. Avoid walking on wet floors. Keep items that you use a lot in easy-to-reach places. If you need to reach something above you, use a strong step stool that has a grab bar. Keep electrical cords out of the way. Do not use floor polish or wax that makes floors slippery. If you must use wax, use non-skid floor wax. Do not have throw rugs and other things on the floor that can make you trip. What can I do with my stairs? Do not leave any items on the stairs. Make sure that there are handrails on both sides of the stairs and use them. Fix handrails that are broken or loose. Make sure that handrails are  as long as the stairways. Check any carpeting to make sure that it is firmly attached to the stairs. Fix any carpet that is loose or worn. Avoid having throw rugs at the top or bottom of the stairs. If you do have throw rugs, attach them to the floor with carpet tape. Make sure that you have a light switch at the top of the stairs and the bottom of the stairs. If you do not have them, ask someone to add them for you. What else can I do to help prevent falls? Wear shoes that: Do not have high heels. Have rubber bottoms. Are comfortable and fit you well. Are closed at the toe. Do not wear sandals. If you use a stepladder: Make sure that it is fully opened. Do not climb a  closed stepladder. Make sure that both sides of the stepladder are locked into place. Ask someone to hold it for you, if possible. Clearly mark and make sure that you can see: Any grab bars or handrails. First and last steps. Where the edge of each step is. Use tools that help you move around (mobility aids) if they are needed. These include: Canes. Walkers. Scooters. Crutches. Turn on the lights when you go into a dark area. Replace any light bulbs as soon as they burn out. Set up your furniture so you have a clear path. Avoid moving your furniture around. If any of your floors are uneven, fix them. If there are any pets around you, be aware of where they are. Review your medicines with your doctor. Some medicines can make you feel dizzy. This can increase your chance of falling. Ask your doctor what other things that you can do to help prevent falls. This information is not intended to replace advice given to you by your health care provider. Make sure you discuss any questions you have with your health care provider. Document Released: 10/31/2008 Document Revised: 06/12/2015 Document Reviewed: 02/08/2014 Elsevier Interactive Patient Education  2017 [employer].

## 2020-07-29 NOTE — Progress Notes (Addendum)
Virtual Visit via Telephone Note  I connected with  Zamani Crocker on 07/29/20 at  1:00 PM EDT by telephone and verified that I am speaking with the correct person using two identifiers.  Medicare Annual Wellness visit completed telephonically due to Covid-19 pandemic.   Persons participating in this call: This Health Coach and this patient.   Location: Patient: Home Provider: Office   I discussed the limitations, risks, security and privacy concerns of performing an evaluation and management service by telephone and the availability of in person appointments. The patient expressed understanding and agreed to proceed.  Unable to perform video visit due to video visit attempted and failed and/or patient does not have video capability.   Some vital signs may be absent or patient reported.   Willette Brace, LPN   Subjective:   Hennesy Sobalvarro is a 85 y.o. female who presents for Medicare Annual (Subsequent) preventive examination.  Review of Systems     Cardiac Risk Factors include: advanced age (>85men, >30 women);hypertension;dyslipidemia;obesity (BMI >30kg/m2)     Objective:    There were no vitals filed for this visit. There is no height or weight on file to calculate BMI.  Advanced Directives 07/29/2020 07/24/2019 06/02/2018 05/31/2018 04/06/2018 03/10/2018 11/09/2016  Does Patient Have a Medical Advance Directive? Yes No No No No Yes Yes  Type of Advance Directive [place];Living will -  Does patient want to make changes to medical advance directive? - - - - - No - Patient declined -  Copy of [place] in Chart? No - copy requested - - - - No - copy requested -  Would patient like information on creating a medical advance directive? - No - Patient declined No - Patient declined No - Patient declined No - Patient declined - -    Current Medications (verified) Outpatient Encounter Medications  as of 07/29/2020  Medication Sig   aspirin 81 MG tablet Take 81 mg by mouth daily.   hydrochlorothiazide (MICROZIDE) 12.5 MG capsule TAKE 1 CAPSULE BY MOUTH  DAILY   Lancets (ACCU-CHEK SOFT TOUCH) lancets Use as instructed   losartan (COZAAR) 25 MG tablet TAKE 2 TABLETS BY MOUTH  DAILY   Menthol, Topical Analgesic, (BENGAY EX) Apply 1 application topically daily as needed (back pain).   metFORMIN (GLUCOPHAGE) 500 MG tablet Take 1 tablet (500 mg total) by mouth daily.   metoprolol succinate (TOPROL-XL) 50 MG 24 hr tablet TAKE 1 TABLET BY MOUTH  DAILY   Misc Natural Products (TART CHERRY ADVANCED PO) Take 1 capsule by mouth daily.    Multiple Vitamins-Minerals (PRESERVISION AREDS 2+MULTI VIT PO) Take by mouth.   pantoprazole (PROTONIX) 40 MG tablet Take 1 tablet (40 mg total) by mouth daily.   Polyethyl Glycol-Propyl Glycol (SYSTANE) 0.4-0.3 % GEL ophthalmic gel Place 1 application into both eyes 2 (two) times daily.   [DISCONTINUED] cholecalciferol (VITAMIN D) 1000 UNITS tablet Take 1,000 Units by mouth daily. (Patient not taking: Reported on 07/29/2020)   [DISCONTINUED] Multiple Vitamin (MULTIVITAMINS PO) Take 1 tablet by mouth daily. (Patient not taking: Reported on 07/29/2020)   [DISCONTINUED] Omega-3 1000 MG CAPS Take 1,000 mg by mouth every morning.  (Patient not taking: Reported on 07/29/2020)   No facility-administered encounter medications on file as of 07/29/2020.    Allergies (verified) Lisinopril and Statins   History: Past Medical History:  Diagnosis Date   CAD (coronary artery disease) 03/19/2014  Colon polyp    Coronary artery disease    GERD (gastroesophageal reflux disease)    Hx of colonoscopy with polypectomy 07/14/2012   medoff    Hyperlipidemia    Hypertension    Impaired fasting glucose    Morbid obesity (HCC)    S/P TAVR (transcatheter aortic valve replacement)    Severe aortic stenosis    Past Surgical History:  Procedure Laterality Date   APPENDECTOMY      CHOLECYSTECTOMY     FOOT SURGERY     rt   KNEE SURGERY     rt   LEFT AND RIGHT HEART CATHETERIZATION WITH CORONARY ANGIOGRAM N/A 10/10/2013   Procedure: LEFT AND RIGHT HEART CATHETERIZATION WITH CORONARY ANGIOGRAM;  Surgeon: Burnell Blanks, MD;  Location: [facility] CATH LAB;  Service: Cardiovascular;  Laterality: N/A;   RIGHT/LEFT HEART CATH AND CORONARY ANGIOGRAPHY N/A 04/06/2018   Procedure: RIGHT/LEFT HEART CATH AND CORONARY ANGIOGRAPHY;  Surgeon: Burnell Blanks, MD;  Location: [facility] CV LAB;  Service: Cardiovascular;  Laterality: N/A;   TEE WITHOUT CARDIOVERSION N/A 06/06/2018   Procedure: TRANSESOPHAGEAL ECHOCARDIOGRAM (TEE);  Surgeon: Burnell Blanks, MD;  Location: [facility];  Service: Open Heart Surgery;  Laterality: N/A;   TONSILLECTOMY     TOTAL KNEE ARTHROPLASTY  02/01/2011   Procedure: TOTAL KNEE ARTHROPLASTY;  Surgeon: Kerin Salen;  Location: [facility];  Service: Orthopedics;  Laterality: Right;  DEPUY/SIGMA   TRANSCATHETER AORTIC VALVE REPLACEMENT, TRANSFEMORAL N/A 06/06/2018   Procedure: TRANSCATHETER AORTIC VALVE REPLACEMENT, TRANSFEMORAL;  Surgeon: Burnell Blanks, MD;  Location: [facility];  Service: Open Heart Surgery;  Laterality: N/A;   Family History  Problem Relation Age of Onset   Coronary artery disease Other        female- 1st degree relative   Coronary artery disease Other        female- 1st degree relative   Hypertension Other    Hyperlipidemia Other    Heart attack Mother        age 38's   Heart attack Father        age 65's   Stroke Brother        died    Hearing loss Son    Sleep apnea Son    Aortic stenosis Sister    Stroke Brother    Heart attack Brother    Rheum arthritis Sister    Osteoarthritis Sister    Arthritis Sister    Heart Problems Brother    Social History   Socioeconomic History   Marital status: Married    Spouse name: Not on file   Number of children: 4   Years of education: Not on file   Highest education  level: Not on file  Occupational History   Occupation: Retired    Comment: Takes care of Conservation officer, nature   Occupation: Therapist, sports, peds, cardiology    Comment: retired  Tobacco Use   Smoking status: Never   Smokeless tobacco: Never  Vaping Use   Vaping Use: Never used  Substance and Sexual Activity   Alcohol use: No   Drug use: No   Sexual activity: Not Currently    Comment: quit 40 yrs ago  Other Topics Concern   Not on file  Social History Narrative   hh of 3 -4  Married   [place] son   At home    Invalid son paraplegia and husband    And grandson recently     No pets   Fluctuating hh  membors she cooks and prepares for them          Social Determinants of Radio broadcast assistant Strain: Low Risk    Difficulty of Paying Living Expenses: Not hard at all  Food Insecurity: No Food Insecurity   Worried About Charity fundraiser in the Last Year: Never true   Arboriculturist in the Last Year: Never true  Transportation Needs: No Transportation Needs   Lack of Transportation (Medical): No   Lack of Transportation (Non-Medical): No  Physical Activity: Inactive   Days of Exercise per Week: 0 days   Minutes of Exercise per Session: 0 min  Stress: No Stress Concern Present   Feeling of Stress : Not at all  Social Connections: Socially Integrated   Frequency of Communication with Friends and Family: More than three times a week   Frequency of Social Gatherings with Friends and Family: Once a week   Attends Religious Services: 1 to 4 times per year   Active Member of [employer] or Organizations: Yes   Attends Archivist Meetings: 1 to 4 times per year   Marital Status: Married    Tobacco Counseling Counseling given: Not Answered   Clinical Intake:  Pre-visit preparation completed: Yes  Pain : No/denies pain     BMI - recorded: 42.84 Nutritional Status: BMI > 30  Obese Nutritional Risks: None Diabetes: No  How often do you need to have someone help you when you  read instructions, pamphlets, or other written materials from your doctor or pharmacy?: 1 - Never  Diabetic?No  Interpreter Needed?: No  Information entered by :: Charlott Rakes, LPN   Activities of Daily Living In your present state of health, do you have any difficulty performing the following activities: 07/29/2020  Hearing? Y  Comment background noise mild loss  Vision? N  Difficulty concentrating or making decisions? Y  Comment memory at times  Walking or climbing stairs? N  Dressing or bathing? N  Doing errands, shopping? N  Preparing Food and eating ? N  Using the Toilet? N  In the past six months, have you accidently leaked urine? Y  Comment pt wears briefs  Do you have problems with loss of bowel control? N  Managing your Medications? N  Managing your Finances? N  Housekeeping or managing your Housekeeping? N  Some recent data might be hidden    Patient Care Team: [name], [name], MD as PCP - General Tanda Rockers, MD (Pulmonary Disease) Almedia Balls, MD (Orthopedic Surgery) Richmond Campbell, MD (Gastroenterology) Lelon Perla, MD as Attending Physician (Cardiology) Marica Otter, OD (Optometry)  Indicate any recent Medical Services you may have received from other than [facility] providers in the past year (date may be approximate).     Assessment:   This is a routine wellness examination for [place].  Hearing/Vision screen Hearing Screening - Comments:: Pt mild loss with background noise Vision Screening - Comments:: Pt follows up with Dr Marica Otter for annual eye  exams   Dietary issues and exercise activities discussed: Current Exercise Habits: The patient does not participate in regular exercise at present   Goals Addressed             This Visit's Progress    Patient Stated       Lose weight         Depression Screen PHQ 2/9 Scores 07/29/2020 07/24/2019 10/06/2018 03/10/2018 02/27/2018 11/09/2016 06/15/2016  PHQ - 2 Score 0 0 0  0 0 0 0  PHQ-  9 Score - - - 2 - - -    Fall Risk Fall Risk  07/29/2020 07/24/2019 10/06/2018 03/10/2018 02/27/2018  Falls in the past year? 1 1 1 1 1   Comment - - - - -  Number falls in past yr: 1 1 1  0 0  Comment slid off bench - - - -  Injury with Fall? 0 0 0 1 0  Risk for fall due to : Impaired vision;Impaired balance/gait;Impaired mobility History of fall(s);Impaired balance/gait;Impaired mobility;Impaired vision History of fall(s) History of fall(s);Impaired balance/gait;Impaired mobility;Impaired vision -  Risk for fall due to: Comment - - - - -  Follow up Falls prevention discussed Falls prevention discussed Falls evaluation completed Falls prevention discussed;Education provided -    FALL RISK PREVENTION PERTAINING TO THE HOME:  Any stairs in or around the home? Yes  If so, are there any without handrails? No  Home free of loose throw rugs in walkways, pet beds, electrical cords, etc? Yes  Adequate lighting in your home to reduce risk of falls? Yes   ASSISTIVE DEVICES UTILIZED TO PREVENT FALLS:  Life alert? No  Use of a cane, walker or w/c? Yes  Grab bars in the bathroom? Yes  Shower chair or bench in shower? Yes  Elevated toilet seat or a handicapped toilet? Yes   TIMED UP AND GO:  Was the test performed? No .      Cognitive Function:     6CIT Screen 07/29/2020 07/24/2019 11/09/2016  What Year? 0 points 0 points 0 points  What month? 0 points 0 points 0 points  What time? 0 points 0 points 0 points  Count back from 20 0 points 0 points 0 points  Months in reverse 0 points 0 points 0 points  Repeat phrase 0 points 2 points 0 points  Total Score 0 2 0    Immunizations Immunization History  Administered Date(s) Administered   Fluad Quad(high Dose 65+) 10/06/2018, 01/10/2020   Influenza Split 09/18/2012   Influenza Whole 12/27/2006, 11/01/2007, 11/08/2008, 10/29/2009, 12/02/2011   Influenza, High Dose Seasonal PF 10/27/2016, 11/14/2017   Influenza-Unspecified 12/19/2014    Pneumococcal Conjugate-13 01/15/2013   Pneumococcal Polysaccharide-23 01/13/2002, 04/16/2015   Td 03/18/1996, 12/31/2009    TDAP status: Due, Education has been provided regarding the importance of this vaccine. Advised may receive this vaccine at local pharmacy or Health Dept. Aware to provide a copy of the vaccination record if obtained from local pharmacy or Health Dept. Verbalized acceptance and understanding.  Flu Vaccine status: Up to date  Pneumococcal vaccine status: Up to date  Covid-19 vaccine status: Completed vaccines pt stated completed will bring in dates   Qualifies for Shingles Vaccine? Yes   Zostavax completed No   Shingrix Completed?: No.    Education has been provided regarding the importance of this vaccine. Patient has been advised to call insurance company to determine out of pocket expense if they have not yet received this vaccine. Advised may also receive vaccine at local pharmacy or Health Dept. Verbalized acceptance and understanding.  Screening Tests Health Maintenance  Topic Date Due   COVID-19 Vaccine (1) Never done   MAMMOGRAM  09/26/2019   TETANUS/TDAP  01/01/2020   Zoster Vaccines- Shingrix (1 of 2) 10/29/2020 (Originally 08/29/1954)   INFLUENZA VACCINE  08/18/2020   DEXA SCAN  Completed   PNA vac Low Risk Adult  Completed   HPV VACCINES  Aged Out    Health Maintenance  Health  Maintenance Due  Topic Date Due   COVID-19 Vaccine (1) Never done   MAMMOGRAM  09/26/2019   TETANUS/TDAP  01/01/2020    Colorectal cancer screening: Type of screening: Colonoscopy. Completed 01/18/19. Repeat every per provider  years  Mammogram status: Completed 10/17/19. Repeat every year  Bone Density status: Completed 07/28/18. Results reflect: Bone density results: OSTEOPENIA. Repeat every 2 years.   Additional Screening:   Vision Screening: Recommended annual ophthalmology exams for early detection of glaucoma and other disorders of the eye. Is the patient up  to date with their annual eye exam?  Yes  Who is the provider or what is the name of the office in which the patient attends annual eye exams? Dr Marica Otter If pt is not established with a provider, would they like to be referred to a provider to establish care? No .   Dental Screening: Recommended annual dental exams for proper oral hygiene  Community Resource Referral / Chronic Care Management: CRR required this visit?  No   CCM required this visit?  No      Plan:     I have personally reviewed and noted the following in the patient's chart:   Medical and social history Use of alcohol, tobacco or illicit drugs  Current medications and supplements including opioid prescriptions.  Functional ability and status Nutritional status Physical activity Advanced directives List of other physicians Hospitalizations, surgeries, and ER visits in previous 12 months Vitals Screenings to include cognitive, depression, and falls Referrals and appointments  In addition, I have reviewed and discussed with patient certain preventive protocols, quality metrics, and best practice recommendations. A written personalized care plan for preventive services as well as general preventive health recommendations were provided to patient.     Willette Brace, LPN   [id number]   Nurse Notes: None

## 2020-07-31 DIAGNOSIS — H524 Presbyopia: Secondary | ICD-10-CM | POA: Diagnosis not present

## 2020-07-31 DIAGNOSIS — H5203 Hypermetropia, bilateral: Secondary | ICD-10-CM | POA: Diagnosis not present

## 2020-07-31 DIAGNOSIS — H16141 Punctate keratitis, right eye: Secondary | ICD-10-CM | POA: Diagnosis not present

## 2020-07-31 DIAGNOSIS — H16142 Punctate keratitis, left eye: Secondary | ICD-10-CM | POA: Diagnosis not present

## 2020-07-31 DIAGNOSIS — S0501XA Injury of conjunctiva and corneal abrasion without foreign body, right eye, initial encounter: Secondary | ICD-10-CM | POA: Diagnosis not present

## 2020-07-31 DIAGNOSIS — H52223 Regular astigmatism, bilateral: Secondary | ICD-10-CM | POA: Diagnosis not present

## 2020-08-06 ENCOUNTER — Other Ambulatory Visit: Payer: Self-pay | Admitting: Cardiology

## 2020-08-14 DIAGNOSIS — H5203 Hypermetropia, bilateral: Secondary | ICD-10-CM | POA: Diagnosis not present

## 2020-08-14 DIAGNOSIS — H52223 Regular astigmatism, bilateral: Secondary | ICD-10-CM | POA: Diagnosis not present

## 2020-08-14 DIAGNOSIS — S0501XA Injury of conjunctiva and corneal abrasion without foreign body, right eye, initial encounter: Secondary | ICD-10-CM | POA: Diagnosis not present

## 2020-08-14 DIAGNOSIS — H524 Presbyopia: Secondary | ICD-10-CM | POA: Diagnosis not present

## 2020-09-28 ENCOUNTER — Other Ambulatory Visit: Payer: Self-pay | Admitting: Cardiology

## 2020-10-22 ENCOUNTER — Encounter: Payer: Self-pay | Admitting: Internal Medicine

## 2020-10-22 DIAGNOSIS — Z1231 Encounter for screening mammogram for malignant neoplasm of breast: Secondary | ICD-10-CM | POA: Diagnosis not present

## 2020-11-07 NOTE — Progress Notes (Deleted)
HPI: FU aortic stenosis/AVR. Placed on beta blocker previously secondary to PVCs. Found to have severe aortic stenosis on follow-up echocardiogram March 2020. Cardiac catheterization revealed a 40% proximal right coronary artery and 20% ostial left main. Carotid Dopplers May 2020 showed 1 to 39% bilateral stenosis. Had TAVR May 2020. Monitor June 2020 showed sinus rhythm with occasional PAC, PVC, couplet and brief PAT. Most recent echocardiogram May 2021 showed normal LV function, mild left ventricular hypertrophy, grade 1 diastolic dysfunction, previous aortic valve replacement with mean gradient 13.7 mmHg.  No aortic insufficiency noted.  Since last seen,   Current Outpatient Medications  Medication Sig Dispense Refill   hydrochlorothiazide (MICROZIDE) 12.5 MG capsule TAKE 1 CAPSULE BY MOUTH  DAILY 60 capsule 0   losartan (COZAAR) 25 MG tablet TAKE 2 TABLETS BY MOUTH  DAILY 120 tablet 0   aspirin 81 MG tablet Take 81 mg by mouth daily.     Lancets (ACCU-CHEK SOFT TOUCH) lancets Use as instructed 100 each 12   Menthol, Topical Analgesic, (BENGAY EX) Apply 1 application topically daily as needed (back pain).     metFORMIN (GLUCOPHAGE) 500 MG tablet Take 1 tablet (500 mg total) by mouth daily. 90 tablet 3   metoprolol succinate (TOPROL-XL) 50 MG 24 hr tablet TAKE 1 TABLET BY MOUTH  DAILY 90 tablet 3   Misc Natural Products (TART CHERRY ADVANCED PO) Take 1 capsule by mouth daily.      Multiple Vitamins-Minerals (PRESERVISION AREDS 2+MULTI VIT PO) Take by mouth.     pantoprazole (PROTONIX) 40 MG tablet Take 1 tablet (40 mg total) by mouth daily. 90 tablet 1   Polyethyl Glycol-Propyl Glycol (SYSTANE) 0.4-0.3 % GEL ophthalmic gel Place 1 application into both eyes 2 (two) times daily.     No current facility-administered medications for this visit.     Past Medical History:  Diagnosis Date   CAD (coronary artery disease) 03/19/2014   Colon polyp    Coronary artery disease    GERD  (gastroesophageal reflux disease)    Hx of colonoscopy with polypectomy 07/14/2012   medoff    Hyperlipidemia    Hypertension    Impaired fasting glucose    Morbid obesity (HCC)    S/P TAVR (transcatheter aortic valve replacement)    Severe aortic stenosis     Past Surgical History:  Procedure Laterality Date   APPENDECTOMY     CHOLECYSTECTOMY     FOOT SURGERY     rt   KNEE SURGERY     rt   LEFT AND RIGHT HEART CATHETERIZATION WITH CORONARY ANGIOGRAM N/A 10/10/2013   Procedure: LEFT AND RIGHT HEART CATHETERIZATION WITH CORONARY ANGIOGRAM;  Surgeon: Burnell Blanks, MD;  Location: [facility] CATH LAB;  Service: Cardiovascular;  Laterality: N/A;   RIGHT/LEFT HEART CATH AND CORONARY ANGIOGRAPHY N/A 04/06/2018   Procedure: RIGHT/LEFT HEART CATH AND CORONARY ANGIOGRAPHY;  Surgeon: Burnell Blanks, MD;  Location: [place] CV LAB;  Service: Cardiovascular;  Laterality: N/A;   TEE WITHOUT CARDIOVERSION N/A 06/06/2018   Procedure: TRANSESOPHAGEAL ECHOCARDIOGRAM (TEE);  Surgeon: Burnell Blanks, MD;  Location: [place];  Service: Open Heart Surgery;  Laterality: N/A;   TONSILLECTOMY     TOTAL KNEE ARTHROPLASTY  02/01/2011   Procedure: TOTAL KNEE ARTHROPLASTY;  Surgeon: Kerin Salen;  Location: [place];  Service: Orthopedics;  Laterality: Right;  DEPUY/SIGMA   TRANSCATHETER AORTIC VALVE REPLACEMENT, TRANSFEMORAL N/A 06/06/2018   Procedure: TRANSCATHETER AORTIC VALVE REPLACEMENT, TRANSFEMORAL;  Surgeon: Lauree Chandler  D, MD;  Location: [place];  Service: Open Heart Surgery;  Laterality: N/A;    Social History   Socioeconomic History   Marital status: Married    Spouse name: Not on file   Number of children: 4   Years of education: Not on file   Highest education level: Not on file  Occupational History   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   Occupation: Therapist, sports, peds, cardiology    Comment: retired  Tobacco Use   Smoking status: Never   Smokeless  tobacco: Never  Vaping Use   Vaping Use: Never used  Substance and Sexual Activity   Alcohol use: No   Drug use: No   Sexual activity: Not Currently    Comment: quit 40 yrs ago  Other Topics Concern   Not on file  Social History Narrative   hh of 3 -4  Married   [place] son   At home    Invalid son paraplegia and husband    And grandson recently     No pets   Fluctuating hh membors she cooks and prepares for them          Social Determinants of Health   Financial Resource Strain: Low Risk    Difficulty of Paying Living Expenses: Not hard at all  Food Insecurity: No Food Insecurity   Worried About Charity fundraiser in the Last Year: Never true   Arboriculturist in the Last Year: Never true  Transportation Needs: No Transportation Needs   Lack of Transportation (Medical): No   Lack of Transportation (Non-Medical): No  Physical Activity: Inactive   Days of Exercise per Week: 0 days   Minutes of Exercise per Session: 0 min  Stress: No Stress Concern Present   Feeling of Stress : Not at all  Social Connections: Socially Integrated   Frequency of Communication with Friends and Family: More than three times a week   Frequency of Social Gatherings with Friends and Family: Once a week   Attends Religious Services: 1 to 4 times per year   Active Member of [employer] or Organizations: Yes   Attends Music therapist: 1 to 4 times per year   Marital Status: Married  Human resources officer Violence: Not At Risk   Fear of Current or Ex-Partner: No   Emotionally Abused: No   Physically Abused: No   Sexually Abused: No    Family History  Problem Relation Age of Onset   Coronary artery disease Other        female- 1st degree relative   Coronary artery disease Other        female- 1st degree relative   Hypertension Other    Hyperlipidemia Other    Heart attack Mother        age 23's   Heart attack Father        age 40's   Stroke Brother        died    Hearing loss Son     Sleep apnea Son    Aortic stenosis Sister    Stroke Brother    Heart attack Brother    Rheum arthritis Sister    Osteoarthritis Sister    Arthritis Sister    Heart Problems Brother     ROS: no fevers or chills, productive cough, hemoptysis, dysphasia, odynophagia, melena, hematochezia, dysuria, hematuria, rash, seizure activity, orthopnea, PND, pedal edema, claudication. Remaining systems are negative.  Physical Exam: Well-developed  well-nourished in no acute distress.  Skin is warm and dry.  HEENT is normal.  Neck is supple.  Chest is clear to auscultation with normal expansion.  Cardiovascular exam is regular rate and rhythm.  Abdominal exam nontender or distended. No masses palpated. Extremities show no edema. neuro grossly intact  ECG- personally reviewed  A/P  1 status post TAVR-continue SBE prophylaxis.  She is describing some shortness of breath.  We will repeat echocardiogram.  2 hypertension-blood pressure controlled.  Continue present medical regimen.  3 hyperlipidemia-continue statin.  4 coronary artery disease-mild on catheterization prior to TAVR.  Continue aspirin and statin.  5 palpitations-continue beta-blocker.  6 dyspnea-this is felt to be likely multifactorial including obesity hypoventilation and deconditioning.  We will arrange echocardiogram to reassess aortic valve replacement.  7 morbid obesity-continue efforts at weight loss.  Kirk Ruths, MD

## 2020-11-14 ENCOUNTER — Ambulatory Visit: Payer: Medicare Other | Admitting: Cardiology

## 2020-11-24 ENCOUNTER — Other Ambulatory Visit: Payer: Self-pay | Admitting: Cardiology

## 2020-12-01 DIAGNOSIS — Z23 Encounter for immunization: Secondary | ICD-10-CM | POA: Diagnosis not present

## 2020-12-23 ENCOUNTER — Ambulatory Visit (INDEPENDENT_AMBULATORY_CARE_PROVIDER_SITE_OTHER): Payer: Medicare Other | Admitting: Cardiology

## 2020-12-23 ENCOUNTER — Other Ambulatory Visit: Payer: Self-pay | Admitting: *Deleted

## 2020-12-23 ENCOUNTER — Encounter: Payer: Self-pay | Admitting: Cardiology

## 2020-12-23 ENCOUNTER — Other Ambulatory Visit: Payer: Self-pay

## 2020-12-23 VITALS — BP 134/78 | HR 76 | Ht 62.0 in | Wt 229.4 lb

## 2020-12-23 DIAGNOSIS — I10 Essential (primary) hypertension: Secondary | ICD-10-CM

## 2020-12-23 DIAGNOSIS — E7800 Pure hypercholesterolemia, unspecified: Secondary | ICD-10-CM | POA: Diagnosis not present

## 2020-12-23 DIAGNOSIS — Z952 Presence of prosthetic heart valve: Secondary | ICD-10-CM

## 2020-12-23 MED ORDER — ROSUVASTATIN CALCIUM 20 MG PO TABS: 20.0000 mg | ORAL_TABLET | Freq: Every day | ORAL | 3 refills | Status: DC

## 2020-12-23 NOTE — Patient Instructions (Signed)
Medication Instructions:   START ROSUVASTATIN 20 MG ONCE DAILY  *If you need a refill on your cardiac medications before your next appointment, please call your pharmacy*   Lab Work:  Your physician recommends that you return for lab work in: [place]  If you have labs (blood work) drawn today and your tests are completely normal, you will receive your results only by: [place] (if you have MyChart) OR A paper copy in the mail If you have any lab test that is abnormal or we need to change your treatment, we will call you to review the results.   Testing/Procedures:  Your physician has requested that you have an echocardiogram. Echocardiography is a painless test that uses sound waves to create images of your heart. It provides your doctor with information about the size and shape of your heart and how well your heart's chambers and valves are working. This procedure takes approximately one hour. There are no restrictions for this procedure. [place]   Follow-Up: At [facility], you and your health needs are our priority.  As part of our continuing mission to provide you with exceptional heart care, we have created designated Provider Care Teams.  These Care Teams include your primary Cardiologist (physician) and Advanced Practice Providers (APPs -  Physician Assistants and Nurse Practitioners) who all work together to provide you with the care you need, when you need it.  We recommend signing up for the patient portal called "MyChart".  Sign up information is provided on this After Visit Summary.  MyChart is used to connect with patients for Virtual Visits (Telemedicine).  Patients are able to view lab/test results, encounter notes, upcoming appointments, etc.  Non-urgent messages can be sent to your provider as well.   To learn more about what you can do with MyChart, go to [email or web address].    Your next appointment:    6 month(s)  The format for your next appointment:   In Person  Provider:   Kirk Ruths MD

## 2020-12-23 NOTE — Progress Notes (Signed)
bmp 

## 2020-12-23 NOTE — Progress Notes (Signed)
HPI:FU aortic stenosis/AVR. Placed on beta blocker previously secondary to PVCs. Found to have severe aortic stenosis on follow-up echocardiogram March 2020. Cardiac catheterization revealed a 40% proximal right coronary artery and 20% ostial left main. Carotid Dopplers May 2020 showed 1 to 39% bilateral stenosis. Had TAVR May 2020. Monitor June 2020 showed sinus rhythm with occasional PAC, PVC, couplet and brief PAT. Most recent echocardiogram May 2021 showed normal LV function, mild left ventricular hypertrophy, grade 1 diastolic dysfunction, previous aortic valve replacement with mean gradient 13.7 mmHg.  No aortic insufficiency noted.  Since last seen, she has some dyspnea on exertion.  No orthopnea or PND.  Occasional mild pedal edema.  She denies chest pain or syncope.  Current Outpatient Medications  Medication Sig Dispense Refill   aspirin 81 MG tablet Take 81 mg by mouth daily.     hydrochlorothiazide (MICROZIDE) 12.5 MG capsule TAKE 1 CAPSULE BY MOUTH  DAILY 60 capsule 3   Lancets (ACCU-CHEK SOFT TOUCH) lancets Use as instructed 100 each 12   losartan (COZAAR) 25 MG tablet TAKE 2 TABLETS BY MOUTH  DAILY 120 tablet 3   Menthol, Topical Analgesic, (BENGAY EX) Apply 1 application topically daily as needed (back pain).     metFORMIN (GLUCOPHAGE) 500 MG tablet Take 1 tablet (500 mg total) by mouth daily. 90 tablet 3   metoprolol succinate (TOPROL-XL) 50 MG 24 hr tablet TAKE 1 TABLET BY MOUTH  DAILY 90 tablet 3   Misc Natural Products (TART CHERRY ADVANCED PO) Take 1 capsule by mouth daily.      Multiple Vitamins-Minerals (PRESERVISION AREDS 2+MULTI VIT PO) Take by mouth.     pantoprazole (PROTONIX) 40 MG tablet Take 1 tablet (40 mg total) by mouth daily. 90 tablet 1   Polyethyl Glycol-Propyl Glycol (SYSTANE) 0.4-0.3 % GEL ophthalmic gel Place 1 application into both eyes 2 (two) times daily.     No current facility-administered medications for this visit.     Past Medical History:   Diagnosis Date   CAD (coronary artery disease) 03/19/2014   Colon polyp    Coronary artery disease    GERD (gastroesophageal reflux disease)    Hx of colonoscopy with polypectomy 07/14/2012   medoff    Hyperlipidemia    Hypertension    Impaired fasting glucose    Morbid obesity (HCC)    S/P TAVR (transcatheter aortic valve replacement)    Severe aortic stenosis     Past Surgical History:  Procedure Laterality Date   APPENDECTOMY     CHOLECYSTECTOMY     FOOT SURGERY     rt   KNEE SURGERY     rt   LEFT AND RIGHT HEART CATHETERIZATION WITH CORONARY ANGIOGRAM N/A 10/10/2013   Procedure: LEFT AND RIGHT HEART CATHETERIZATION WITH CORONARY ANGIOGRAM;  Surgeon: Burnell Blanks, MD;  Location: [facility] CATH LAB;  Service: Cardiovascular;  Laterality: N/A;   RIGHT/LEFT HEART CATH AND CORONARY ANGIOGRAPHY N/A 04/06/2018   Procedure: RIGHT/LEFT HEART CATH AND CORONARY ANGIOGRAPHY;  Surgeon: Burnell Blanks, MD;  Location: [place] CV LAB;  Service: Cardiovascular;  Laterality: N/A;   TEE WITHOUT CARDIOVERSION N/A 06/06/2018   Procedure: TRANSESOPHAGEAL ECHOCARDIOGRAM (TEE);  Surgeon: Burnell Blanks, MD;  Location: [place];  Service: Open Heart Surgery;  Laterality: N/A;   TONSILLECTOMY     TOTAL KNEE ARTHROPLASTY  02/01/2011   Procedure: TOTAL KNEE ARTHROPLASTY;  Surgeon: Kerin Salen;  Location: [place];  Service: Orthopedics;  Laterality: Right;  DEPUY/SIGMA  TRANSCATHETER AORTIC VALVE REPLACEMENT, TRANSFEMORAL N/A 06/06/2018   Procedure: TRANSCATHETER AORTIC VALVE REPLACEMENT, TRANSFEMORAL;  Surgeon: Burnell Blanks, MD;  Location: [place];  Service: Open Heart Surgery;  Laterality: N/A;    Social History   Socioeconomic History   Marital status: Married    Spouse name: Not on file   Number of children: 4   Years of education: Not on file   Highest education level: Not on file  Occupational History   Occupation: Retired    Comment: Takes care of Grandchildren  occ   Occupation: Therapist, sports, peds, cardiology    Comment: retired  Tobacco Use   Smoking status: Never   Smokeless tobacco: Never  Vaping Use   Vaping Use: Never used  Substance and Sexual Activity   Alcohol use: No   Drug use: No   Sexual activity: Not Currently    Comment: quit 40 yrs ago  Other Topics Concern   Not on file  Social History Narrative   hh of 3 -4  Married   [place] son   At home    Invalid son paraplegia and husband    And grandson recently     No pets   Fluctuating hh membors she cooks and prepares for them          Social Determinants of Health   Financial Resource Strain: Low Risk    Difficulty of Paying Living Expenses: Not hard at all  Food Insecurity: No Food Insecurity   Worried About Charity fundraiser in the Last Year: Never true   Arboriculturist in the Last Year: Never true  Transportation Needs: No Transportation Needs   Lack of Transportation (Medical): No   Lack of Transportation (Non-Medical): No  Physical Activity: Inactive   Days of Exercise per Week: 0 days   Minutes of Exercise per Session: 0 min  Stress: No Stress Concern Present   Feeling of Stress : Not at all  Social Connections: Socially Integrated   Frequency of Communication with Friends and Family: More than three times a week   Frequency of Social Gatherings with Friends and Family: Once a week   Attends Religious Services: 1 to 4 times per year   Active Member of [employer] or Organizations: Yes   Attends Music therapist: 1 to 4 times per year   Marital Status: Married  Human resources officer Violence: Not At Risk   Fear of Current or Ex-Partner: No   Emotionally Abused: No   Physically Abused: No   Sexually Abused: No    Family History  Problem Relation Age of Onset   Coronary artery disease Other        female- 1st degree relative   Coronary artery disease Other        female- 1st degree relative   Hypertension Other    Hyperlipidemia Other    Heart attack Mother         age 54's   Heart attack Father        age 29's   Stroke Brother        died    Hearing loss Son    Sleep apnea Son    Aortic stenosis Sister    Stroke Brother    Heart attack Brother    Rheum arthritis Sister    Osteoarthritis Sister    Arthritis Sister    Heart Problems Brother     ROS: Back pain and arthralgias but no fevers or chills, productive  cough, hemoptysis, dysphasia, odynophagia, melena, hematochezia, dysuria, hematuria, rash, seizure activity, orthopnea, PND, pedal edema, claudication. Remaining systems are negative.  Physical Exam: Well-developed well-nourished in no acute distress.  Skin is warm and dry.  HEENT is normal.  Neck is supple.  Chest is clear to auscultation with normal expansion.  Cardiovascular exam is regular rate and rhythm.  2/6 systolic murmur left sternal border.  No diastolic murmur. Abdominal exam nontender or distended. No masses palpated. Extremities show no edema. neuro grossly intact  ECG-normal sinus rhythm at a rate of 76, normal axis, no ST changes.  Personally reviewed  A/P  1 status post TAVR-continue SBE prophylaxis.  Repeat echocardiogram.  2 hypertension-blood pressure controlled.  Continue present medications.  Check potassium and renal function.  3 hyperlipidemia -she did not tolerate high-dose Crestor.  We will try 20 mg daily.  Check lipids and liver in 8 weeks.  4 coronary artery disease-mild on previous catheterization.  Continue aspirin and resume statin.  5 palpitations-continue beta-blocker.  Symptoms are controlled.  6 morbid obesity-we discussed importance of weight loss.  7 persistent dyspnea-felt to be multifactorial including obesity hypoventilation syndrome as well as deconditioning.  Kirk Ruths, MD

## 2021-01-13 ENCOUNTER — Ambulatory Visit (HOSPITAL_COMMUNITY): Payer: Medicare Other | Attending: Cardiovascular Disease

## 2021-01-13 ENCOUNTER — Other Ambulatory Visit: Payer: Self-pay

## 2021-01-13 DIAGNOSIS — Z952 Presence of prosthetic heart valve: Secondary | ICD-10-CM | POA: Insufficient documentation

## 2021-01-13 LAB — ECHOCARDIOGRAM COMPLETE
AV Mean grad: 14 mmHg
AV Peak grad: 25.2 mmHg
Ao pk vel: 2.51 m/s
Area-P 1/2: 2.47 cm2
S' Lateral: 2.5 cm

## 2021-01-20 ENCOUNTER — Encounter: Payer: Self-pay | Admitting: *Deleted

## 2021-03-18 DIAGNOSIS — E7800 Pure hypercholesterolemia, unspecified: Secondary | ICD-10-CM | POA: Diagnosis not present

## 2021-03-19 LAB — HEPATIC FUNCTION PANEL
ALT: 26 IU/L (ref 0–32)
AST: 22 IU/L (ref 0–40)
Albumin: 4.3 g/dL (ref 3.6–4.6)
Alkaline Phosphatase: 80 IU/L (ref 44–121)
Bilirubin Total: 0.4 mg/dL (ref 0.0–1.2)
Bilirubin, Direct: 0.15 mg/dL (ref 0.00–0.40)
Total Protein: 6.8 g/dL (ref 6.0–8.5)

## 2021-03-19 LAB — LIPID PANEL
Chol/HDL Ratio: 2.8 ratio (ref 0.0–4.4)
Cholesterol, Total: 171 mg/dL (ref 100–199)
HDL: 61 mg/dL (ref >39)
LDL Chol Calc (NIH): 75 mg/dL (ref 0–99)
Triglycerides: 212 mg/dL — ABNORMAL HIGH (ref 0–149)
VLDL Cholesterol Cal: 35 mg/dL (ref 5–40)

## 2021-04-10 ENCOUNTER — Ambulatory Visit: Payer: Medicare Other | Admitting: Cardiology

## 2021-04-12 NOTE — Progress Notes (Signed)
? ?Chief Complaint  ?Patient presents with  ? Follow-up  ?  Glucose reading 130-140  ? ? ?HPI: ?Anna Gamble 86 y.o. come in for follow-up of blood sugar control ?Last visit with me  MAy [id number]  Cards  s/p TAVR  ?Metfomrin taking 500 mg once a day does not really want to go up on the dose because a family member had diarrhea on metformin was taking off of it altogether. ?Her blood sugars in the morning or in the130-1940 range creeping up unable to do as much physical activity because of her joints back and knees. ?Does not feel her cardiac symptoms are the limiting factor at this time used to do water exercise but [facility] ? ?Has UI has to wear protection ?ROS: See pertinent positives and negatives per HPI.  No bleeding no falling. ? ?Past Medical History:  ?Diagnosis Date  ? CAD (coronary artery disease) 03/19/2014  ? Colon polyp   ? Coronary artery disease   ? GERD (gastroesophageal reflux disease)   ? Hx of colonoscopy with polypectomy 07/14/2012  ? medoff   ? Hyperlipidemia   ? Hypertension   ? Impaired fasting glucose   ? Morbid obesity ([place]   ? S/P TAVR (transcatheter aortic valve replacement)   ? Severe aortic stenosis   ? ? ?Family History  ?Problem Relation Age of Onset  ? Coronary artery disease Other   ?     female- 1st degree relative  ? Coronary artery disease Other   ?     female- 1st degree relative  ? Hypertension Other   ? Hyperlipidemia Other   ? Heart attack Mother   ?     age 84's ? Heart attack Father   ?     age 84's ? Stroke Brother   ?     died   ? Hearing loss Son   ? Sleep apnea Son   ? Aortic stenosis Sister   ? Stroke Brother   ? Heart attack Brother   ? Rheum arthritis Sister   ? Osteoarthritis Sister   ? Arthritis Sister   ? Heart Problems Brother   ? ? ?Social History  ? ?Socioeconomic History  ? Marital status: Married  ?  Spouse name: Not on file  ? Number of children: 4  ? Years of education: Not on file  ? Highest education  level: Not on file  ?Occupational History  ? Occupation: Retired  ?  Comment: Takes care of Grandchildren occ  ? Occupation: RTherapist, sports peds, cardiology  ?  Comment: retired  ?Tobacco Use  ? Smoking status: Never  ? Smokeless tobacco: Never  ?Vaping Use  ? Vaping Use: Never used  ?Substance and Sexual Activity  ? Alcohol use: No  ? Drug use: No  ? Sexual activity: Not Currently  ?  Comment: quit 40 yrs ago  ?Other Topics Concern  ? Not on file  ?Social History Narrative  ? hh of 3 -4  Married   [place]   At home   ? Invalid son paraplegia and husband    And grandson recently    ? No pets  ? Fluctuating hh membors she cooks and prepares for them   ?   ?   ? ?Social Determinants of Health  ? ?Financial Resource Strain: Low Risk   ? Difficulty of Paying Living Expenses: Not hard at all  ?Food Insecurity: No Food Insecurity  ?  Worried About Charity fundraiser in the Last Year: Never true  ? Ran Out of Food in the Last Year: Never true  ?Transportation Needs: No Transportation Needs  ? Lack of Transportation (Medical): No  ? Lack of Transportation (Non-Medical): No  ?Physical Activity: Inactive  ? Days of Exercise per Week: 0 days  ? Minutes of Exercise per Session: 0 min  ?Stress: No Stress Concern Present  ? Feeling of Stress : Not at all  ?Social Connections: Socially Integrated  ? Frequency of Communication with Friends and Family: More than three times a week  ? Frequency of Social Gatherings with Friends and Family: Once a week  ? Attends Religious Services: 1 to 4 times per year  ? Active Member of Clubs or Organizations: Yes  ? Attends Archivist Meetings: 1 to 4 times per year  ? Marital Status: Married  ? ? ?Outpatient Medications Prior to Visit  ?Medication Sig Dispense Refill  ? aspirin 81 MG tablet Take 81 mg by mouth daily.    ? hydrochlorothiazide (MICROZIDE) 12.5 MG capsule TAKE 1 CAPSULE BY MOUTH  DAILY 60 capsule 3  ? Lancets (ACCU-CHEK SOFT TOUCH) lancets Use as instructed 100 each 12  ? losartan  (COZAAR) 25 MG tablet TAKE 2 TABLETS BY MOUTH  DAILY 120 tablet 3  ? Menthol, Topical Analgesic, (BENGAY EX) Apply 1 application topically daily as needed (back pain).    ? metFORMIN (GLUCOPHAGE) 500 MG tablet Take 1 tablet (500 mg total) by mouth daily. 90 tablet 3  ? metoprolol succinate (TOPROL-XL) 50 MG 24 hr tablet TAKE 1 TABLET BY MOUTH  DAILY 90 tablet 3  ? Misc Natural Products (TART CHERRY ADVANCED PO) Take 1 capsule by mouth daily.     ? Multiple Vitamins-Minerals (PRESERVISION AREDS 2+MULTI VIT PO) Take by mouth.    ? pantoprazole (PROTONIX) 40 MG tablet Take 1 tablet (40 mg total) by mouth daily. 90 tablet 1  ? Polyethyl Glycol-Propyl Glycol (SYSTANE) 0.4-0.3 % GEL ophthalmic gel Place 1 application into both eyes 2 (two) times daily.    ? rosuvastatin (CRESTOR) 20 MG tablet Take 1 tablet (20 mg total) by mouth daily. 90 tablet 3  ? ?No facility-administered medications prior to visit.  ? ? ? ?EXAM: ? ?BP 116/82 (BP Location: Left Arm, Patient Position: Sitting, Cuff Size: Normal)   Pulse 71   Temp 98 ?F (36.7 ?C) (Oral)   Ht '5\' 2"'$  (1.575 m)   Wt 229 lb 12.8 oz (104.2 kg)   SpO2 98%   BMI 42.03 kg/m?  ? ?Body mass index is 42.03 kg/m?. ? ?GENERAL: vitals reviewed and listed above, alert, oriented, appears well hydrated and in no acute distress ?HEENT: atraumatic, conjunctiva  clear, no obvious abnormalities on inspection of external nose and ears OP masked ?NECK: no obvious masses on inspection palpation  ?LUNGS: clear to auscultation bilaterally, no wheezes, rales or rhonchi, good air movement ?CV: HRRR, slight systolic murmur nonradiating no clubbing cyanosis or only slight peripheral edema nl cap refill  ?MS: moves all extremities without noticeable focal  abnormality walks with a cane slowly ?PSYCH: pleasant and cooperative, no obvious depression or anxiety ?Lab Results  ?Component Value Date  ? WBC 7.0 05/27/2020  ? HGB 13.8 05/27/2020  ? HCT 40.1 05/27/2020  ? PLT 251.0 05/27/2020  ? GLUCOSE  124 (H) 05/27/2020  ? CHOL 171 03/18/2021  ? TRIG 212 (H) 03/18/2021  ? HDL 61 03/18/2021  ? LDLDIRECT 171.0 05/27/2020  ?  [place] 75 03/18/2021  ? ALT 26 03/18/2021  ? AST 22 03/18/2021  ? NA 138 05/27/2020  ? K 4.5 05/27/2020  ? CL 100 05/27/2020  ? CREATININE 1.01 05/27/2020  ? BUN 15 05/27/2020  ? CO2 32 05/27/2020  ? TSH 1.05 04/16/2015  ? INR 1.0 06/02/2018  ? HGBA1C 7.3 (H) 05/27/2020  ? MICROALBUR 0.9 05/27/2020  ? ?BP Readings from Last 3 Encounters:  ?04/13/21 116/82  ?12/23/20 134/78  ?05/27/20 120/68  ? ?Wt Readings from Last 3 Encounters:  ?04/13/21 229 lb 12.8 oz (104.2 kg)  ?12/23/20 229 lb 6.4 oz (104.1 kg)  ?05/27/20 234 lb 3.2 oz (106.2 kg)  ? ? ? ?ASSESSMENT AND PLAN: ? ?Discussed the following assessment and plan: ? ?Diabetes mellitus type 2 in obese ([place]) - Plan: Basic metabolic panel, CBC with Differential/Platelet, Hemoglobin A1c, Hepatic function panel, TSH, TSH, Hepatic function panel, Hemoglobin A1c, CBC with Differential/Platelet, Basic metabolic panel, Microalbumin/Creatinine Ratio, Urine, CANCELED: Microalbumin / creatinine urine ratio, CANCELED: Microalbumin / creatinine urine ratio ? ?Medication management - Plan: Basic metabolic panel, CBC with Differential/Platelet, Hemoglobin A1c, Hepatic function panel, TSH, TSH, Hepatic function panel, Hemoglobin A1c, CBC with Differential/Platelet, Basic metabolic panel, Microalbumin/Creatinine Ratio, Urine, CANCELED: Microalbumin / creatinine urine ratio, CANCELED: Microalbumin / creatinine urine ratio ? ?Essential hypertension - Plan: Basic metabolic panel, CBC with Differential/Platelet, Hemoglobin A1c, Hepatic function panel, TSH, TSH, Hepatic function panel, Hemoglobin A1c, CBC with Differential/Platelet, Basic metabolic panel, Microalbumin/Creatinine Ratio, Urine, CANCELED: Microalbumin / creatinine urine ratio, CANCELED: Microalbumin / creatinine urine ratio ? ?Morbid obesity ([place]) - Plan: Basic metabolic panel, CBC with  Differential/Platelet, Hemoglobin A1c, Hepatic function panel, TSH, TSH, Hepatic function panel, Hemoglobin A1c, CBC with Differential/Platelet, Basic metabolic panel, Microalbumin/Creatinine Ratio, Urine, CANCELED: Microalbumin

## 2021-04-13 ENCOUNTER — Ambulatory Visit (INDEPENDENT_AMBULATORY_CARE_PROVIDER_SITE_OTHER): Payer: Medicare Other | Admitting: Internal Medicine

## 2021-04-13 ENCOUNTER — Encounter: Payer: Self-pay | Admitting: Internal Medicine

## 2021-04-13 VITALS — BP 116/82 | HR 71 | Temp 98.0°F | Ht 62.0 in | Wt 229.8 lb

## 2021-04-13 DIAGNOSIS — Z952 Presence of prosthetic heart valve: Secondary | ICD-10-CM | POA: Diagnosis not present

## 2021-04-13 DIAGNOSIS — Z79899 Other long term (current) drug therapy: Secondary | ICD-10-CM

## 2021-04-13 DIAGNOSIS — Z7409 Other reduced mobility: Secondary | ICD-10-CM

## 2021-04-13 DIAGNOSIS — E669 Obesity, unspecified: Secondary | ICD-10-CM | POA: Diagnosis not present

## 2021-04-13 DIAGNOSIS — E1169 Type 2 diabetes mellitus with other specified complication: Secondary | ICD-10-CM

## 2021-04-13 DIAGNOSIS — R32 Unspecified urinary incontinence: Secondary | ICD-10-CM | POA: Diagnosis not present

## 2021-04-13 DIAGNOSIS — I10 Essential (primary) hypertension: Secondary | ICD-10-CM | POA: Diagnosis not present

## 2021-04-13 DIAGNOSIS — R7301 Impaired fasting glucose: Secondary | ICD-10-CM

## 2021-04-13 LAB — HEPATIC FUNCTION PANEL
ALT: 25 U/L (ref 0–35)
AST: 22 U/L (ref 0–37)
Albumin: 4.2 g/dL (ref 3.5–5.2)
Alkaline Phosphatase: 56 U/L (ref 39–117)
Bilirubin, Direct: 0.1 mg/dL (ref 0.0–0.3)
Total Bilirubin: 0.7 mg/dL (ref 0.2–1.2)
Total Protein: 7.1 g/dL (ref 6.0–8.3)

## 2021-04-13 LAB — CBC WITH DIFFERENTIAL/PLATELET
Basophils Absolute: 0 10*3/uL (ref 0.0–0.1)
Basophils Relative: 0.6 % (ref 0.0–3.0)
Eosinophils Absolute: 0.1 10*3/uL (ref 0.0–0.7)
Eosinophils Relative: 1.6 % (ref 0.0–5.0)
HCT: 39.8 % (ref 36.0–46.0)
Hemoglobin: 13.4 g/dL (ref 12.0–15.0)
Lymphocytes Relative: 37.7 % (ref 12.0–46.0)
Lymphs Abs: 2.7 10*3/uL (ref 0.7–4.0)
MCHC: 33.8 g/dL (ref 30.0–36.0)
MCV: 95.7 fl (ref 78.0–100.0)
Monocytes Absolute: 0.6 10*3/uL (ref 0.1–1.0)
Monocytes Relative: 8.9 % (ref 3.0–12.0)
Neutro Abs: 3.7 10*3/uL (ref 1.4–7.7)
Neutrophils Relative %: 51.2 % (ref 43.0–77.0)
Platelets: 219 10*3/uL (ref 150.0–400.0)
RBC: 4.16 Mil/uL (ref 3.87–5.11)
RDW: 13.3 % (ref 11.5–15.5)
WBC: 7.2 10*3/uL (ref 4.0–10.5)

## 2021-04-13 LAB — HEMOGLOBIN A1C: Hgb A1c MFr Bld: 8 % — ABNORMAL HIGH (ref 4.6–6.5)

## 2021-04-13 LAB — BASIC METABOLIC PANEL
BUN: 14 mg/dL (ref 6–23)
CO2: 32 mEq/L (ref 19–32)
Calcium: 9.5 mg/dL (ref 8.4–10.5)
Chloride: 101 mEq/L (ref 96–112)
Creatinine, Ser: 0.86 mg/dL (ref 0.40–1.20)
GFR: 62.38 mL/min (ref >60.00)
Glucose, Bld: 136 mg/dL — ABNORMAL HIGH (ref 70–99)
Potassium: 4.1 mEq/L (ref 3.5–5.1)
Sodium: 139 mEq/L (ref 135–145)

## 2021-04-13 NOTE — Patient Instructions (Addendum)
Good to see you today . ? ?Depending on labs plan to increase to 500 mg metformin twice a day. ? ?If getting side effects contact us for advice  ? ?If needed we can  use a different medication. Such as farxiga  or ozempic type medication.   ? ?Try to get back to water  walking  and exercise .  ? ? ?

## 2021-04-14 LAB — TSH: TSH: 0.95 u[IU]/mL (ref 0.35–5.50)

## 2021-04-15 ENCOUNTER — Other Ambulatory Visit: Payer: Self-pay | Admitting: Internal Medicine

## 2021-04-16 NOTE — Progress Notes (Signed)
A1c is up  as expected .  Increase the metformin to 500 twice a day . ?Plan rov in 3 months   ?(Consider adding farxiga   )

## 2021-05-06 ENCOUNTER — Telehealth: Payer: Self-pay | Admitting: Internal Medicine

## 2021-05-06 NOTE — Telephone Encounter (Signed)
Last Ov 04/13/21 ?Please advise  ?

## 2021-05-06 NOTE — Telephone Encounter (Signed)
Increase the metformin by 500 mg a day as tolerated.  Which would be 500 mg 3 times a day if not sure she wants to do this make virtual visit or other to discuss other options.

## 2021-05-06 NOTE — Telephone Encounter (Signed)
Pt calling regarding her metFORMIN (GLUCOPHAGE) 500 MG tablet  states she has been taking it for 2 weeks and her level is still 172, concerned at whether the medication is helping ?

## 2021-05-07 ENCOUNTER — Encounter: Payer: Self-pay | Admitting: Internal Medicine

## 2021-05-07 ENCOUNTER — Telehealth (INDEPENDENT_AMBULATORY_CARE_PROVIDER_SITE_OTHER): Payer: Medicare Other | Admitting: Internal Medicine

## 2021-05-07 DIAGNOSIS — Z79899 Other long term (current) drug therapy: Secondary | ICD-10-CM | POA: Diagnosis not present

## 2021-05-07 DIAGNOSIS — E669 Obesity, unspecified: Secondary | ICD-10-CM | POA: Diagnosis not present

## 2021-05-07 DIAGNOSIS — Z7409 Other reduced mobility: Secondary | ICD-10-CM | POA: Diagnosis not present

## 2021-05-07 DIAGNOSIS — E1169 Type 2 diabetes mellitus with other specified complication: Secondary | ICD-10-CM

## 2021-05-07 MED ORDER — METFORMIN HCL 500 MG PO TABS: 500.0000 mg | ORAL_TABLET | Freq: Two times a day (BID) | ORAL | 3 refills | Status: DC

## 2021-05-07 NOTE — Telephone Encounter (Signed)
Spoke with patient to inform of message and patient stated she can not take rx 3 tid,  I offered virtual visit and scheduled at 11 am 05/07/21 ?

## 2021-05-07 NOTE — Progress Notes (Signed)
? ?Virtual Visit via Telephone Note ? ?I connected with@ on 05/07/21 at 11:00 AM EDT by telephone and verified that I am speaking with the correct person using two identifiers. ?  ?I discussed the limitations, risks, security and privacy concerns of performing an evaluation and management service by telephone and the limited availability of in person appointments. tThere may be a patient responsible charge related to this service. The patient expressed understanding and agreed to proceed. ? ?Location patient: home ?Location provider: home office ?Participants present for the call: patient, provider ?Patient did not have a visit in the prior 7 days to address this/these issue(s). ? ? ?History of Present Illness: ?Anna Gamble presents for visit in regard to metformin and blood sugar readings. ?She did increase her metformin to twice daily with meals and fortunately was able to tolerate it unlike her family members. ?However blood sugar fasting or morning mornings have slowly risen just recently was 143 yesterday had a 172 and recently a 160.  States she has not changed her dietary eating given through Easter.  No new medicines no other change in health. ?As stated before she is reluctant to increase dosing or add medicine with the new medicines ?Her husband and son also have diabetes her husband is on metformin and some other medicine and is under control. ?Observations/Objective: ?Patient sounds personable and well on the phone. ?I do not appreciate any SOB. ?Speech and thought processing are grossly intact. ?Patient reported vitals: ?Lab Results  ?Component Value Date  ? WBC 7.2 04/13/2021  ? HGB 13.4 04/13/2021  ? HCT 39.8 04/13/2021  ? PLT 219.0 04/13/2021  ? GLUCOSE 136 (H) 04/13/2021  ? CHOL 171 03/18/2021  ? TRIG 212 (H) 03/18/2021  ? HDL 61 03/18/2021  ? LDLDIRECT 171.0 05/27/2020  ? [place] 75 03/18/2021  ? ALT 25 04/13/2021  ? AST 22 04/13/2021  ? NA 139 04/13/2021  ? K 4.1 04/13/2021  ? CL 101  04/13/2021  ? CREATININE 0.86 04/13/2021  ? BUN 14 04/13/2021  ? CO2 32 04/13/2021  ? TSH 0.95 04/13/2021  ? INR 1.0 06/02/2018  ? HGBA1C 8.0 (H) 04/13/2021  ? MICROALBUR 0.9 05/27/2020  ? ? ?Assessment and Plan: ?Follow Up Instructions: ? ?Tolerating twice daily metformin  500 but unexpected slow rise in fasting sugars recently without explanation.  Options given continue a bit longer attention to lifestyle increase metformin to 500 3 times daily continue on metformin and add low-dose [place]. ?At this time choosing  ?Continue twice daily metformin for another 2 weeks and send in blood sugar readings status for other options ?She is very reluctant to increase to 1500 mg a day or even add another medication although may be willing to add Farxiga low-dose after explanation. ? ?She was unable to get a urine protein ratio at the last visit has an upcoming visit in June where we can obtain that urine microalbumin. ? ?Glad she was able to tolerate the twice daily 1000 mg a day metformin. ?I did not refer this patient for an OV in the next 24 hours for this/these issue(s). ? ?I discussed the assessment and treatment plan with the patient. The patient was provided an opportunity to ask questions and answered. The patient agreed with the plan and demonstrated an understanding of the instructions. ?  ?The patient was advised to call back or seek an in-person evaluation if the symptoms worsen or if the condition fails to improve as anticipated. ? ?I provided 16 minutes of non-face-to-face  time during this encounter. ?Return for as planned in June   send in update in about 2 weeks  bg and status . ? ?Shanon Ace, MD  ?

## 2021-05-12 ENCOUNTER — Other Ambulatory Visit: Payer: Self-pay | Admitting: Cardiology

## 2021-06-08 ENCOUNTER — Telehealth: Payer: Self-pay | Admitting: Internal Medicine

## 2021-06-08 NOTE — Telephone Encounter (Signed)
Left voicemail for patient to call the office 

## 2021-06-08 NOTE — Telephone Encounter (Signed)
Patient called in to see if her standing order for urine. Patient would like a call back regarding this matter

## 2021-06-10 NOTE — Telephone Encounter (Signed)
Yes dont know what this is about

## 2021-06-11 NOTE — Telephone Encounter (Signed)
Patient returned call and asked if lab orders were in from visit on 04/13/21 Microalbumin/Creatinine Ratio, Urine  Patient stated she was not able to give urine at last appt and was told she could go to [place] lab and wanted to make sure lab orders were placed,  I informed her they were and she could go anytime

## 2021-06-16 ENCOUNTER — Other Ambulatory Visit: Payer: Medicare Other

## 2021-06-16 DIAGNOSIS — I10 Essential (primary) hypertension: Secondary | ICD-10-CM

## 2021-06-16 DIAGNOSIS — Z79899 Other long term (current) drug therapy: Secondary | ICD-10-CM

## 2021-06-16 DIAGNOSIS — E1169 Type 2 diabetes mellitus with other specified complication: Secondary | ICD-10-CM

## 2021-06-16 DIAGNOSIS — E669 Obesity, unspecified: Secondary | ICD-10-CM | POA: Diagnosis not present

## 2021-06-16 LAB — MICROALBUMIN / CREATININE URINE RATIO
Creatinine,U: 92.2 mg/dL
Microalb Creat Ratio: 0.8 mg/g (ref 0.0–30.0)
Microalb, Ur: 0.7 mg/dL (ref 0.0–1.9)

## 2021-06-16 NOTE — Progress Notes (Signed)
Urine protein screen   is normal (no elevated protein levels. )

## 2021-06-23 NOTE — Progress Notes (Signed)
HPI:FU aortic stenosis/AVR. Placed on beta blocker previously secondary to PVCs. Found to have severe aortic stenosis on follow-up echocardiogram March 2020. Cardiac catheterization revealed a 40% proximal right coronary artery and 20% ostial left main. Carotid Dopplers May 2020 showed 1 to 39% bilateral stenosis. Had TAVR May 2020. Monitor June 2020 showed sinus rhythm with occasional PAC, PVC, couplet and brief PAT. Last echocardiogram December 2022 showed normal LV function, mild left ventricular hypertrophy, grade 1 diastolic dysfunction, status post aortic valve replacement with mean gradient 14 mmHg and no aortic insufficiency.  Since last seen, she has some dyspnea on exertion unchanged.  No orthopnea or PND.  Minimal pedal edema.  No exertional chest pain or syncope.  Current Outpatient Medications  Medication Sig Dispense Refill   aspirin 81 MG tablet Take 81 mg by mouth daily.     hydrochlorothiazide (MICROZIDE) 12.5 MG capsule TAKE 1 CAPSULE BY MOUTH DAILY 90 capsule 3   Lancets (ACCU-CHEK SOFT TOUCH) lancets Use as instructed 100 each 12   losartan (COZAAR) 25 MG tablet TAKE 2 TABLETS BY MOUTH DAILY 180 tablet 3   Menthol, Topical Analgesic, (BENGAY EX) Apply 1 application topically daily as needed (back pain).     metoprolol succinate (TOPROL-XL) 50 MG 24 hr tablet TAKE 1 TABLET BY MOUTH  DAILY 90 tablet 3   Misc Natural Products (TART CHERRY ADVANCED PO) Take 1 capsule by mouth daily.      pantoprazole (PROTONIX) 40 MG tablet Take 1 tablet (40 mg total) by mouth daily. 90 tablet 1   Polyethyl Glycol-Propyl Glycol (SYSTANE) 0.4-0.3 % GEL ophthalmic gel Place 1 application into both eyes 2 (two) times daily.     rosuvastatin (CRESTOR) 20 MG tablet Take 1 tablet (20 mg total) by mouth daily. 90 tablet 3   metFORMIN (GLUCOPHAGE) 500 MG tablet Take 1 tablet (500 mg total) by mouth 2 (two) times daily with a meal. (Patient not taking: Reported on 06/26/2021) 180 tablet 3   Multiple  Vitamins-Minerals (PRESERVISION AREDS 2+MULTI VIT PO) Take by mouth. (Patient not taking: Reported on 06/26/2021)     No current facility-administered medications for this visit.     Past Medical History:  Diagnosis Date   CAD (coronary artery disease) 03/19/2014   Colon polyp    Coronary artery disease    GERD (gastroesophageal reflux disease)    Hx of colonoscopy with polypectomy 07/14/2012   medoff    Hyperlipidemia    Hypertension    Impaired fasting glucose    Morbid obesity ([facility])    S/P TAVR (transcatheter aortic valve replacement)    Severe aortic stenosis     Past Surgical History:  Procedure Laterality Date   APPENDECTOMY     CHOLECYSTECTOMY     FOOT SURGERY     rt   KNEE SURGERY     rt   LEFT AND RIGHT HEART CATHETERIZATION WITH CORONARY ANGIOGRAM N/A 10/10/2013   Procedure: LEFT AND RIGHT HEART CATHETERIZATION WITH CORONARY ANGIOGRAM;  Surgeon: Burnell Blanks, MD;  Location: [facility] CATH LAB;  Service: Cardiovascular;  Laterality: N/A;   RIGHT/LEFT HEART CATH AND CORONARY ANGIOGRAPHY N/A 04/06/2018   Procedure: RIGHT/LEFT HEART CATH AND CORONARY ANGIOGRAPHY;  Surgeon: Burnell Blanks, MD;  Location: [place] CV LAB;  Service: Cardiovascular;  Laterality: N/A;   TEE WITHOUT CARDIOVERSION N/A 06/06/2018   Procedure: TRANSESOPHAGEAL ECHOCARDIOGRAM (TEE);  Surgeon: Burnell Blanks, MD;  Location: [place];  Service: Open Heart Surgery;  Laterality: N/A;  TONSILLECTOMY     TOTAL KNEE ARTHROPLASTY  02/01/2011   Procedure: TOTAL KNEE ARTHROPLASTY;  Surgeon: Kerin Salen;  Location: [place];  Service: Orthopedics;  Laterality: Right;  DEPUY/SIGMA   TRANSCATHETER AORTIC VALVE REPLACEMENT, TRANSFEMORAL N/A 06/06/2018   Procedure: TRANSCATHETER AORTIC VALVE REPLACEMENT, TRANSFEMORAL;  Surgeon: Burnell Blanks, MD;  Location: [place];  Service: Open Heart Surgery;  Laterality: N/A;    Social History   Socioeconomic History   Marital status: Married     Spouse name: Not on file   Number of children: 4   Years of education: Not on file   Highest education level: Not on file  Occupational History   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   Occupation: Therapist, sports, peds, cardiology    Comment: retired  Tobacco Use   Smoking status: Never   Smokeless tobacco: Never  Vaping Use   Vaping Use: Never used  Substance and Sexual Activity   Alcohol use: No   Drug use: No   Sexual activity: Not Currently    Comment: quit 40 yrs ago  Other Topics Concern   Not on file  Social History Narrative   hh of 3 -4  Married   [place] son   At home    Invalid son paraplegia and husband    And grandson recently     No pets   Fluctuating hh membors she cooks and prepares for them          Social Determinants of Health   Financial Resource Strain: [place]  (07/29/2020)   Overall Financial Resource Strain (CARDIA)    Difficulty of Paying Living Expenses: Not hard at all  Food Insecurity: No [place] (07/29/2020)   Hunger Vital Sign    Worried About Running Out of Food in the Last Year: Never true    [place] in the Last Year: Never true  Transportation Needs: No Transportation Needs (07/29/2020)   PRAPARE - Hydrologist (Medical): No    Lack of Transportation (Non-Medical): No  Physical Activity: Inactive (07/29/2020)   Exercise Vital Sign    Days of Exercise per Week: 0 days    Minutes of Exercise per Session: 0 min  Stress: No Stress Concern Present (07/29/2020)   [place]    Feeling of Stress : Not at all  Social Connections: [place] (07/29/2020)   Social Connection and Isolation Panel [NHANES]    Frequency of Communication with Friends and Family: More than three times a week    Frequency of Social Gatherings with Friends and Family: Once a week    Attends Religious Services: 1 to 4 times per year    Active  Member of [employer] or Organizations: Yes    Attends Archivist Meetings: 1 to 4 times per year    Marital Status: Married  Human resources officer Violence: Not At Risk (07/29/2020)   Humiliation, Afraid, Rape, and Kick questionnaire    Fear of Current or Ex-Partner: No    Emotionally Abused: No    Physically Abused: No    Sexually Abused: No    Family History  Problem Relation Age of Onset   Coronary artery disease Other        female- 1st degree relative   Coronary artery disease Other        female- 1st degree relative   Hypertension Other  Hyperlipidemia Other    Heart attack Mother        age 14's   Heart attack Father        age 44's   Stroke Brother        died    Hearing loss Son    Sleep apnea Son    Aortic stenosis Sister    Stroke Brother    Heart attack Brother    Rheum arthritis Sister    Osteoarthritis Sister    Arthritis Sister    Heart Problems Brother     ROS: no fevers or chills, productive cough, hemoptysis, dysphasia, odynophagia, melena, hematochezia, dysuria, hematuria, rash, seizure activity, orthopnea, PND, claudication. Remaining systems are negative.  Physical Exam: Well-developed well-nourished in no acute distress.  Skin is warm and dry.  HEENT is normal.  Neck is supple.  Chest is clear to auscultation with normal expansion.  Cardiovascular exam is regular rate and rhythm.  2/6 systolic murmur left sternal border.  No diastolic murmur. Abdominal exam nontender or distended. No masses palpated. Extremities show trace edema. neuro grossly intact  A/P  1 status post TAVR-most recent echocardiogram showed normally functioning valve.  Continue SBE prophylaxis.  2 hypertension-patient's blood pressure is controlled.  Continue present medical regimen.  3 hyperlipidemia-continue statin.  4 coronary artery disease-mild on previous catheterization prior to TAVR.  We will continue aspirin and statin.  5 history of palpitations-not particular  bothersome at present.  Continue beta-blocker.  6 dyspnea-felt to be multifactorial including obesity hypoventilation syndrome and deconditioning.  Kirk Ruths, MD

## 2021-06-26 ENCOUNTER — Ambulatory Visit (INDEPENDENT_AMBULATORY_CARE_PROVIDER_SITE_OTHER): Payer: Medicare Other | Admitting: Cardiology

## 2021-06-26 ENCOUNTER — Encounter: Payer: Self-pay | Admitting: Cardiology

## 2021-06-26 VITALS — BP 135/74 | HR 67 | Ht 62.0 in | Wt 227.0 lb

## 2021-06-26 DIAGNOSIS — E7800 Pure hypercholesterolemia, unspecified: Secondary | ICD-10-CM

## 2021-06-26 DIAGNOSIS — Z952 Presence of prosthetic heart valve: Secondary | ICD-10-CM

## 2021-06-26 DIAGNOSIS — R002 Palpitations: Secondary | ICD-10-CM | POA: Diagnosis not present

## 2021-06-26 DIAGNOSIS — I10 Essential (primary) hypertension: Secondary | ICD-10-CM

## 2021-06-26 NOTE — Patient Instructions (Signed)

## 2021-07-09 ENCOUNTER — Other Ambulatory Visit: Payer: Self-pay | Admitting: Cardiology

## 2021-07-14 ENCOUNTER — Ambulatory Visit (INDEPENDENT_AMBULATORY_CARE_PROVIDER_SITE_OTHER): Payer: Medicare Other | Admitting: Internal Medicine

## 2021-07-14 ENCOUNTER — Encounter: Payer: Self-pay | Admitting: Internal Medicine

## 2021-07-14 VITALS — BP 124/72 | HR 68 | Temp 98.4°F | Ht 62.0 in | Wt 222.8 lb

## 2021-07-14 DIAGNOSIS — E1169 Type 2 diabetes mellitus with other specified complication: Secondary | ICD-10-CM

## 2021-07-14 DIAGNOSIS — Z79899 Other long term (current) drug therapy: Secondary | ICD-10-CM

## 2021-07-14 DIAGNOSIS — Z7409 Other reduced mobility: Secondary | ICD-10-CM | POA: Diagnosis not present

## 2021-07-14 DIAGNOSIS — E669 Obesity, unspecified: Secondary | ICD-10-CM | POA: Diagnosis not present

## 2021-07-14 LAB — POCT GLYCOSYLATED HEMOGLOBIN (HGB A1C): Hemoglobin A1C: 6.9 % — AB (ref 4.0–5.6)

## 2021-07-14 NOTE — Progress Notes (Signed)
Chief Complaint  Patient presents with   Follow-up    HPI: Anna Gamble 86 y.o. come in for Chronic disease management  Dm:  taking  500 bid   tolerating   but gets ocass  diarrhea  loose  Doing Golo.  In addition to her dietary changes thinks it has been helpful. Not really interested in changing the metformin to the XR version concerned about that. Cardiac is stable. No new numbness weakness vision change ROS: See pertinent positives and negatives per HPI.  Past Medical History:  Diagnosis Date   CAD (coronary artery disease) 03/19/2014   Colon polyp    Coronary artery disease    GERD (gastroesophageal reflux disease)    Hx of colonoscopy with polypectomy 07/14/2012   medoff    Hyperlipidemia    Hypertension    Impaired fasting glucose    Morbid obesity (HCC)    S/P TAVR (transcatheter aortic valve replacement)    Severe aortic stenosis     Family History  Problem Relation Age of Onset   Coronary artery disease Other        female- 1st degree relative   Coronary artery disease Other        female- 1st degree relative   Hypertension Other    Hyperlipidemia Other    Heart attack Mother        age 40's   Heart attack Father        age 66's   Stroke Brother        died    Hearing loss Son    Sleep apnea Son    Aortic stenosis Sister    Stroke Brother    Heart attack Brother    Rheum arthritis Sister    Osteoarthritis Sister    Arthritis Sister    Heart Problems Brother     Social History   Socioeconomic History   Marital status: Married    Spouse name: Not on file   Number of children: 4   Years of education: Not on file   Highest education level: Not on file  Occupational History   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   Occupation: Charity fundraiser, peds, cardiology    Comment: retired  Tobacco Use   Smoking status: Never   Smokeless tobacco: Never  Vaping Use   Vaping Use: Never used  Substance and Sexual Activity   Alcohol  use: No   Drug use: No   Sexual activity: Not Currently    Comment: quit 40 yrs ago  Other Topics Concern   Not on file  Social History Narrative   hh of 3 -4  Married   Grand son   At home    Invalid son paraplegia and husband    And grandson recently     No pets   Fluctuating hh membors she cooks and prepares for them          Social Determinants of Health   Financial Resource Strain: Low Risk  (07/29/2020)   Overall Financial Resource Strain (CARDIA)    Difficulty of Paying Living Expenses: Not hard at all  Food Insecurity: No Food Insecurity (07/29/2020)   Hunger Vital Sign    Worried About Running Out of Food in the Last Year: Never true    Ran Out of Food in the Last Year: Never true  Transportation Needs: No Transportation Needs (07/29/2020)   PRAPARE - Administrator, Civil Service (Medical): No  Lack of Transportation (Non-Medical): No  Physical Activity: Inactive (07/29/2020)   Exercise Vital Sign    Days of Exercise per Week: 0 days    Minutes of Exercise per Session: 0 min  Stress: No Stress Concern Present (07/29/2020)   [employer] of Occupational Health - Occupational Stress Questionnaire    Feeling of Stress : Not at all  Social Connections: Socially Integrated (07/29/2020)   Social Connection and Isolation Panel [NHANES]    Frequency of Communication with Friends and Family: More than three times a week    Frequency of Social Gatherings with Friends and Family: Once a week    Attends Religious Services: 1 to 4 times per year    Active Member of [employer] or Organizations: Yes    Attends Banker Meetings: 1 to 4 times per year    Marital Status: Married    Outpatient Medications Prior to Visit  Medication Sig Dispense Refill   aspirin 81 MG tablet Take 81 mg by mouth daily.     hydrochlorothiazide (MICROZIDE) 12.5 MG capsule TAKE 1 CAPSULE BY MOUTH DAILY 90 capsule 3   Lancets (ACCU-CHEK SOFT TOUCH) lancets Use as instructed 100  each 12   losartan (COZAAR) 25 MG tablet TAKE 2 TABLETS BY MOUTH DAILY 180 tablet 3   Menthol, Topical Analgesic, (BENGAY EX) Apply 1 application topically daily as needed (back pain).     metFORMIN (GLUCOPHAGE) 500 MG tablet Take 1 tablet (500 mg total) by mouth 2 (two) times daily with a meal. 180 tablet 3   metoprolol succinate (TOPROL-XL) 50 MG 24 hr tablet TAKE 1 TABLET BY MOUTH  DAILY 90 tablet 3   Misc Natural Products (TART CHERRY ADVANCED PO) Take 1 capsule by mouth daily.      Multiple Vitamins-Minerals (PRESERVISION AREDS 2+MULTI VIT PO) Take by mouth.     pantoprazole (PROTONIX) 40 MG tablet Take 1 tablet (40 mg total) by mouth daily. 90 tablet 1   Polyethyl Glycol-Propyl Glycol (SYSTANE) 0.4-0.3 % GEL ophthalmic gel Place 1 application into both eyes 2 (two) times daily.     rosuvastatin (CRESTOR) 20 MG tablet Take 1 tablet (20 mg total) by mouth daily. 90 tablet 3   No facility-administered medications prior to visit.     EXAM:  BP 124/72 (BP Location: Left Arm, Patient Position: Sitting, Cuff Size: Normal)   Pulse 68   Temp 98.4 F (36.9 C) (Oral)   Ht 5\' 2"  (1.575 m)   Wt 222 lb 12.8 oz (101.1 kg)   SpO2 97%   BMI 40.75 kg/m   Body mass index is 40.75 kg/m.  GENERAL: vitals reviewed and listed above, alert, oriented, appears well hydrated and in no acute distress HEENT: atraumatic, conjunctiva  clear, no obvious abnormalities on inspection of external nose and ears   LUNGS: clear to auscultation bilaterally, no wheezes, rales or rhonchi, good air movement CV: HRRR, systolic ejection murmur no clubbing cyanosis or  peripheral edema nl cap refill   PSYCH: pleasant and cooperative, no obvious depression or anxiety Lab Results  Component Value Date   WBC 7.2 04/13/2021   HGB 13.4 04/13/2021   HCT 39.8 04/13/2021   PLT 219.0 04/13/2021   GLUCOSE 136 (H) 04/13/2021   CHOL 171 03/18/2021   TRIG 212 (H) 03/18/2021   HDL 61 03/18/2021   LDLDIRECT 171.0 05/27/2020    LDLCALC 75 03/18/2021   ALT 25 04/13/2021   AST 22 04/13/2021   NA 139 04/13/2021   K 4.1  04/13/2021   CL 101 04/13/2021   CREATININE 0.86 04/13/2021   BUN 14 04/13/2021   CO2 32 04/13/2021   TSH 0.95 04/13/2021   INR 1.0 06/02/2018   HGBA1C 6.9 (A) 07/14/2021   MICROALBUR 0.7 06/16/2021   BP Readings from Last 3 Encounters:  07/14/21 124/72  06/26/21 135/74  04/13/21 116/82    ASSESSMENT AND PLAN:  Discussed the following assessment and plan:  Diabetes mellitus type 2 in obese (HCC) - better   ? se of metformin at times  - Plan: POC HgB A1c, AMB Referral to [facility] Coordinaton  Medication management - Plan: AMB Referral to [facility]  Morbid obesity (HCC) - Plan: AMB Referral to Community Care Coordinaton  Mobility impaired - Plan: AMB Referral to Community Care Coordinaton Hemoglobin A1c is significantly improved she is attributes it to [facility] in addition to her previous lifestyle.  Offered to change metformin to XR but not interested at this time there have been some problems with optimum Rx and not getting refills. Discussed option of having her chronic disease chronic care management pharmacy team might be helpful for her.  She is willing to talk with them.  -Patient advised to return or notify health care team  if  new concerns arise.  Patient Instructions  Good to see you today . Continue lifestyle and plans   Will have pharmacy team reach out  to you.  Let us know if you want to try XR.  Metformin.  Plan rov in  6 mos        Neta Mends. Marlynn Hinckley M.D.

## 2021-07-15 ENCOUNTER — Telehealth: Payer: Self-pay | Admitting: Internal Medicine

## 2021-07-15 NOTE — Progress Notes (Signed)
  Care Management  Note   07/15/2021 Name: Elona Yinger MRN: [id number] DOB: 20-Sep-1935  Janet Berlin Staron is a 86 y.o. year old female who is a primary care patient of [name], [name], MD. The care management team was consulted for assistance with chronic disease management and care coordination needs.   Ms. Albritton was given information about Care Management services today including:  CCM service includes personalized support from designated clinical staff supervised by the physician, including individualized plan of care and coordination with other care providers 24/7 contact phone numbers for assistance for urgent and routine care needs. Service will only be billed when office clinical staff spend 20 minutes or more in a month to coordinate care. Only one practitioner may furnish and bill the service in a calendar month. The patient may stop CCM services at amy time (effective at the end of the month) by phone call to the office staff. The patient will be responsible for cost sharing (co-pay) or up to 20% of the service fee (after annual deductible is met)  Patient agreed to services and verbal consent obtained.  Follow up plan:   An initial telephone outreach has been scheduled for: 08/05/21 [email or web address]    Leeann [employer]

## 2021-07-29 DIAGNOSIS — H52223 Regular astigmatism, bilateral: Secondary | ICD-10-CM | POA: Diagnosis not present

## 2021-07-29 DIAGNOSIS — H5203 Hypermetropia, bilateral: Secondary | ICD-10-CM | POA: Diagnosis not present

## 2021-07-29 DIAGNOSIS — H16143 Punctate keratitis, bilateral: Secondary | ICD-10-CM | POA: Diagnosis not present

## 2021-07-29 DIAGNOSIS — H524 Presbyopia: Secondary | ICD-10-CM | POA: Diagnosis not present

## 2021-08-04 ENCOUNTER — Telehealth: Payer: Self-pay | Admitting: Pharmacist

## 2021-08-04 NOTE — Progress Notes (Signed)
Chronic Care Management Pharmacy Note  08/05/2021 Name:  Anna Gamble MRN:  [id number] DOB:  13-Jan-1936  Summary: A1c at goal < 7% but patient wants to lose more weight LDL not quite at goal < 70 BP at goal < 130/80  Recommendations/Changes made from today's visit: -Recommended bringing BP cuff to next office visit to ensure accuracy -Requested switching to losartan 50 mg tablets -Recommended moving losartan to evening to avoid dizziness  -Recommended switching metformin to Ozempic for weight loss and further A1c lowering -Consider repeat DEXA  Plan: Follow up tolerance assessment in 3-4 weeks after starting Ozempic  Low sodium salt   Subjective: Anna Gamble is an 86 y.o. year old female who is a primary patient of [name], [name], MD.  The CCM team was consulted for assistance with disease management and care coordination needs.    Engaged with patient by telephone for initial visit in response to provider referral for pharmacy case management and/or care coordination services.   Consent to Services:  The patient was given the following information about Chronic Care Management services today, agreed to services, and gave verbal consent: 1. CCM service includes personalized support from designated clinical staff supervised by the primary care provider, including individualized plan of care and coordination with other care providers 2. 24/7 contact phone numbers for assistance for urgent and routine care needs. 3. Service will only be billed when office clinical staff spend 20 minutes or more in a month to coordinate care. 4. Only one practitioner may furnish and bill the service in a calendar month. 5.The patient may stop CCM services at any time (effective at the end of the month) by phone call to the office staff. 6. The patient will be responsible for cost sharing (co-pay) of up to 20% of the service fee (after annual deductible is met). Patient agreed to services  and consent obtained.  Patient Care Team: [name], [name], MD as PCP - General Melvyn Novas Christena Deem, MD (Pulmonary Disease) Almedia Balls, MD (Orthopedic Surgery) Richmond Campbell, MD (Gastroenterology) Lelon Perla, MD as Attending Physician (Cardiology) Marica Otter, [place] (Optometry) Viona Gilmore, [facility] as Pharmacist (Pharmacist)  Recent office visits: 07/14/21  Burnis Medin, MD - Patient presented for Diabetes Mellitus type 2 in obese and other concerns. Recommended XR Metformin. No medication changes.   05/07/21 [name], [name], MD - Patient presented via video for Diabetes mellitus type 2 and other concerns. Increased Metformin  500 mg to twice daily.   04/13/21 [name], [name], MD - Patient presented for diabetes mellitus and other concerns. Plan to Increase Metformin pending labs.  Recent consult visits: 06/26/21 Lelon Perla, MD (Cardiology) - Patient presented for S/P TAVR and other concerns. No medication changes.  Hospital visits: None in previous 6 months   Objective:  Lab Results  Component Value Date   CREATININE 0.86 04/13/2021   BUN 14 04/13/2021   GFR 62.38 04/13/2021   GFRNONAA 64 04/19/2019   GFRAA 74 04/19/2019   NA 139 04/13/2021   K 4.1 04/13/2021   CALCIUM 9.5 04/13/2021   CO2 32 04/13/2021   GLUCOSE 136 (H) 04/13/2021    Lab Results  Component Value Date/Time   HGBA1C 6.9 (A) 07/14/2021 09:07 AM   HGBA1C 8.0 (H) 04/13/2021 10:34 AM   HGBA1C 7.3 (H) 05/27/2020 11:01 AM   GFR 62.38 04/13/2021 10:34 AM   GFR 51.76 (L) 05/27/2020 11:01 AM   MICROALBUR 0.7 06/16/2021 10:40 AM   MICROALBUR  0.9 05/27/2020 11:13 AM    Last diabetic Eye exam: No results found for: "HMDIABEYEEXA"  Last diabetic Foot exam: No results found for: "HMDIABFOOTEX"   Lab Results  Component Value Date   CHOL 171 03/18/2021   HDL 61 03/18/2021   LDLCALC 75 03/18/2021   LDLDIRECT 171.0 05/27/2020   TRIG 212 (H) 03/18/2021   CHOLHDL 2.8 03/18/2021       Latest  Ref Rng & Units 04/13/2021   10:34 AM 03/18/2021    9:08 AM 05/27/2020   11:01 AM  Hepatic Function  Total Protein 6.0 - 8.3 g/dL 7.1  6.8  6.9   Albumin 3.5 - 5.2 g/dL 4.2  4.3  4.2   AST 0 - 37 U/L $Remo'22  22  22   'lRLae$ ALT 0 - 35 U/L 25  26  32   Alk Phosphatase 39 - 117 U/L 56  80  70   Total Bilirubin 0.2 - 1.2 mg/dL 0.7  0.4  0.6   Bilirubin, Direct 0.0 - 0.3 mg/dL 0.1  0.15  0.1     Lab Results  Component Value Date/Time   TSH 0.95 04/13/2021 10:34 AM   TSH 1.05 04/16/2015 10:43 AM       Latest Ref Rng & Units 04/13/2021   10:34 AM 05/27/2020   11:01 AM 10/06/2018    9:10 AM  CBC  WBC 4.0 - 10.5 K/uL 7.2  7.0  5.3   Hemoglobin 12.0 - 15.0 g/dL 13.4  13.8  13.9   Hematocrit 36.0 - 46.0 % 39.8  40.1  41.1   Platelets 150.0 - 400.0 K/uL 219.0  251.0  248.0     Lab Results  Component Value Date/Time   VD25OH 39 03/26/2008 10:01 PM    Clinical ASCVD: Yes  The ASCVD Risk score (Arnett DK, et al., 2019) failed to calculate for the following reasons:   The 2019 ASCVD risk score is only valid for ages 83 to 34       07/14/2021    9:41 AM 04/13/2021    9:57 AM 07/29/2020    1:12 PM  Depression screen PHQ 2/9  Decreased Interest 1 1 0  Down, Depressed, Hopeless 0 0 0  PHQ - 2 Score 1 1 0  Altered sleeping 1 1   Tired, decreased energy 2 2   Change in appetite 1 1   Feeling bad or failure about yourself  0 0   Trouble concentrating 1 1   Moving slowly or fidgety/restless 0 1   Suicidal thoughts 0 0   PHQ-9 Score 6 7   Difficult doing work/chores Not difficult at all Not difficult at all       Social History   Tobacco Use  Smoking Status Never  Smokeless Tobacco Never   BP Readings from Last 3 Encounters:  07/14/21 124/72  06/26/21 135/74  04/13/21 116/82   Pulse Readings from Last 3 Encounters:  07/14/21 68  06/26/21 67  04/13/21 71   Wt Readings from Last 3 Encounters:  07/14/21 222 lb 12.8 oz (101.1 kg)  06/26/21 227 lb (103 kg)  04/13/21 229 lb 12.8 oz  (104.2 kg)   BMI Readings from Last 3 Encounters:  07/14/21 40.75 kg/m  06/26/21 41.52 kg/m  04/13/21 42.03 kg/m    Assessment/Interventions: Review of patient past medical history, allergies, medications, health status, including review of consultants reports, laboratory and other test data, was performed as part of comprehensive evaluation and provision of chronic care management services.  SDOH:  (Social Determinants of Health) assessments and interventions performed: Yes SDOH Interventions    Flowsheet Row Most Recent Value  SDOH Interventions   Financial Strain Interventions Intervention Not Indicated      SDOH Screenings   Alcohol Screen: Not on file  Depression (PHQ2-9): Medium Risk (07/14/2021)   Depression (PHQ2-9)    PHQ-2 Score: 6  Financial Resource Strain: Low Risk  (08/05/2021)   Overall Financial Resource Strain (CARDIA)    Difficulty of Paying Living Expenses: Not hard at all  Food Insecurity: No Food Insecurity (07/29/2020)   Hunger Vital Sign    Worried About Running Out of Food in the Last Year: Never true    [place] in the Last Year: Never true  Housing: Low Risk  (07/29/2020)   Housing    Last Housing Risk Score: 0  Physical Activity: Inactive (07/29/2020)   Exercise Vital Sign    Days of Exercise per Week: 0 days    Minutes of Exercise per Session: 0 min  Social Connections: Socially Integrated (07/29/2020)   Social Connection and Isolation Panel [NHANES]    Frequency of Communication with Friends and Family: More than three times a week    Frequency of Social Gatherings with Friends and Family: Once a week    Attends Religious Services: 1 to 4 times per year    Active Member of [employer] or Organizations: Yes    Attends Archivist Meetings: 1 to 4 times per year    Marital Status: Married  Stress: No Stress Concern Present (07/29/2020)   [employer] [place] of Stress  : Not at all  Tobacco Use: Low Risk  (07/14/2021)   Patient History    Smoking Tobacco Use: Never    Smokeless Tobacco Use: Never    Passive Exposure: Not on file  Transportation Needs: No Transportation Needs (08/05/2021)   PRAPARE - Transportation    Lack of Transportation (Medical): No    Lack of Transportation (Non-Medical): No    Patient reports she usually gets up at 5 am and has always been an early riser and then she usually watches gardening shows on youtube. Then she gets a cup of coffee with cream and stevia with her husband. For breakfast, her husband eats a lot of carbs and she usually eats an egg and oatmeal and she usually varies what she eats every morning.  She does have a garden but she can't go in it but she has fallen in it. Sundays she goes to church. After breakfast she watches TV and takes a bath. For lunch, she usually eats a hamburger or hot dog without the bun or a sandwich. She usually takes a nap for about an hour and if she doesn't take one then she will watch the news.  Patient lives with her husband and they have been married for 62 years and her son who is a paraplegic. Patient's husband teaches Sunday school so they go to church every Sunday. She used to go to Bible study on Wednesdays but she doesn't go anymore because she has trouble moving around. She has had a lot of falls and has to make adjustments to her lifestyle because of it. She rides a golf cart around her 5 acres property for fear of falling.  Patient doesn't usually have trouble falling asleep at night. But once in a while she does have issues with it. She wakes up with a  headache at night sometimes.  Patient reports her biggest concern with her health is getting her diabetes under control. Patient doesn't want to add more medications. She used to stay in good shape when she went to the gym but is unable to anymore. Patient has been taking her medications regularly and watching what she is eating. She  is trying to do some arm exercises but that's all she can do.  CCM Care Plan  Allergies  Allergen Reactions   Lisinopril Cough   Statins Other (See Comments)    muscle aches with some statins, tolerates low doses of crestor    Medications Reviewed Today     Reviewed by Viona Gilmore, [facility] (Pharmacist) on 08/05/21 at 1210  Med List Status: <None>   Medication Order Taking? Sig Documenting Provider Last Dose Status Informant  aspirin 81 MG tablet [id number] No Take 81 mg by mouth daily.  Patient not taking: Reported on 08/05/2021   [provider] Not Taking Active Self  hydrochlorothiazide (MICROZIDE) 12.5 MG capsule [id number] Yes TAKE 1 CAPSULE BY MOUTH DAILY Crenshaw, Denice Bors, MD Taking Active   Lancets (ACCU-CHEK SOFT TOUCH) lancets [id number]  Use as instructed [name], [name], MD  Active   losartan (COZAAR) 25 MG tablet [id number] Yes TAKE 2 TABLETS BY MOUTH DAILY [name], MD Taking Active   Menthol, Topical Analgesic, [facility] EX) [id number] Yes Apply 1 application topically daily as needed (back pain). [provider] Taking Active Self  metFORMIN (GLUCOPHAGE) 500 MG tablet [id number] Yes Take 1 tablet (500 mg total) by mouth 2 (two) times daily with a meal. [name], [name], MD Taking Active   metoprolol succinate (TOPROL-XL) 50 MG 24 hr tablet [id number] Yes TAKE 1 TABLET BY MOUTH  DAILY Crenshaw, Denice Bors, MD Taking Active   Misc Natural Products (TART CHERRY ADVANCED PO) [id number] Yes Take 1 capsule by mouth daily.  [provider] Taking Active Self           Med Note Coralyn Mark, Lars Mage Jun 14, 2018  1:46 PM)    Multiple Vitamins-Minerals (PRESERVISION AREDS 2+MULTI VIT PO) [id number] Yes Take by mouth. [provider] Taking Active   Polyethyl Glycol-Propyl Glycol (SYSTANE) 0.4-0.3 % GEL ophthalmic gel [id number] Yes Place 1 application into both eyes 2 (two) times daily. [provider] Taking Active Self  rosuvastatin  (CRESTOR) 20 MG tablet [id number] Yes Take 1 tablet (20 mg total) by mouth daily. Lelon Perla, MD Taking Active   Turmeric 500 MG CAPS [id number] Yes Take 500 mg by mouth daily. [provider] Taking Active             Patient Active Problem List   Diagnosis Date Noted   S/P TAVR (transcatheter aortic valve replacement)    Severe aortic valve stenosis    CAD (coronary artery disease) 03/19/2014   GERD (gastroesophageal reflux disease)    UNSPECIFIED OSTEOPOROSIS 03/26/2008   IMPAIRED FASTING GLUCOSE 03/23/2007   FASTING HYPERGLYCEMIA 12/27/2006   HLD (hyperlipidemia) 07/21/2006   Morbid obesity ([place]) 07/21/2006   Essential hypertension 07/21/2006   LOW BACK PAIN 07/21/2006    Immunization History  Administered Date(s) Administered   Fluad Quad(high Dose 65+) 10/06/2018, 01/10/2020   Influenza Split 09/18/2012   Influenza Whole 12/27/2006, 11/01/2007, 11/08/2008, 10/29/2009, 12/02/2011   Influenza, High Dose Seasonal PF 10/27/2016, 11/14/2017   Influenza-Unspecified 12/19/2014   Pneumococcal Conjugate-13 01/15/2013   Pneumococcal Polysaccharide-23 01/13/2002, 04/16/2015   Td 03/18/1996, 12/31/2009  Conditions to be addressed/monitored:  Hypertension, Hyperlipidemia, Diabetes, Coronary Artery Disease, and GERD  Care Plan : CCM Pharmacy Care Plan  Updates made by Viona Gilmore, [place] since 08/05/2021 12:00 AM     Problem: Problem: Hypertension, Hyperlipidemia, Diabetes, Coronary Artery Disease, and GERD      Long-Range Goal: Patient-Specific Goal   Start Date: 08/05/2021  Expected End Date: 08/06/2022  This Visit's Progress: On track  Priority: High  Note:   Current Barriers:  Unable to independently monitor therapeutic efficacy  Pharmacist Clinical Goal(s):  Patient will achieve adherence to monitoring guidelines and medication adherence to achieve therapeutic efficacy through collaboration with PharmD and provider.   Interventions: 1:1  collaboration with [name], [name], MD regarding development and update of comprehensive plan of care as evidenced by provider attestation and co-signature Inter-disciplinary care team collaboration (see longitudinal plan of care) Comprehensive medication review performed; medication list updated in electronic medical record  Hypertension (BP goal <140/90) -Controlled -Current treatment: Losartan 25 mg 2 tablets daily - Appropriate, Effective, Safe, Accessible HCTZ 12.5 mg 1 capsule daily -  Appropriate, Effective, Safe, Accessible Metoprolol succinate 50 mg 1 tablet daily - Appropriate, Effective, Safe, Accessible -Medications previously tried: unknown  -Current home readings: 130/72 - has an arm cuff; checks 1-2 times a week -Current dietary habits: doesn't really use much salt when cooking; does read label for carbs but not much with salt; frozen meals some and canned  -Current exercise habits: unable to do much -Reports hypotensive/hypertensive symptoms  -Educated on BP goals and benefits of medications for prevention of heart attack, stroke and kidney damage; Daily salt intake goal < 2300 mg; Importance of home blood pressure monitoring; Symptoms of hypotension and importance of maintaining adequate hydration; -Counseled to monitor BP at home weekly, document, and provide log at future appointments -Counseled on diet and exercise extensively Recommended to continue current medication  Hyperlipidemia: (LDL goal < 70) -Not ideally controlled -Current treatment: Rosuvastatin 20 mg 1 tablet daily - Appropriate, Query effective, Safe, Accessible -Medications previously tried: couldn't tolerate 40 mg dose (myalgias)  -Current dietary patterns: trying to limit carb intake  -Current exercise habits: minimal -Educated on Cholesterol goals;  Importance of limiting foods high in cholesterol; Exercise goal of 150 minutes per week; -Counseled on diet and exercise extensively Recommended to  continue current medication  Diabetes (A1c goal <7%) -Controlled -Current medications: Metformin 500 mg 1 tablet twice daily - Appropriate, Effective, Safe, Accessible -Medications previously tried: none  -Current home glucose readings fasting glucose: 129 -188 (checks first thing in the morning) post prandial glucose: > 200 after meals -Denies hypoglycemic/hyperglycemic symptoms -Current meal patterns:  breakfast: egg and oatmeal  lunch: not eating much meat  dinner: eating more vegetables and fruit  snacks: not snacking much, eating less food drinks: water, diet soda -Current exercise: can't walk and can't exercise much and had a knee replacement, has a recumbent bike and cannot use with her knee -Educated on A1c and blood sugar goals; Complications of diabetes including kidney damage, retinal damage, and cardiovascular disease; Benefits of routine self-monitoring of blood sugar; -Counseled to check feet daily and get yearly eye exams -Recommended switching metformin to Ozempic.  GERD (Goal: minimize symptoms) -Controlled -Current treatment  Tums as needed - Appropriate, Effective, Safe, Accessible -Medications previously tried:  pantoprazole (no longer needed)  -Counseled on non-pharmacologic management of symptoms such as elevating the head of your bed, avoiding eating 2-3 hours before bed, avoiding triggering foods such as acidic, spicy, or fatty  foods, eating smaller meals, and wearing clothes that are loose around the waist  CAD (Goal: prevent heart events) -Controlled -Current treatment  Rosuvastatin 20 mg 1 tablet daily - Appropriate, Effective, Safe, Accessible Aspirin 81 mg 1 tablet daily - Appropriate, Effective, Safe, Accessible -Medications previously tried: none  -Recommended to continue current medication Counseled on limiting use of NSAIDs with use of aspirin.   Osteopenia (Goal prevent fractures) -Uncontrolled -Last DEXA Scan: 2020   T-Score femoral neck:  -2.2  T-Score total hip: -1.9  T-Score lumbar spine: n/a  T-Score forearm radius: n/a  10-year probability of major osteoporotic fracture: 14.3%  10-year probability of hip fracture: 4.3% -Patient is a candidate for pharmacologic treatment due to T-Score -1.0 to -2.5 and 10-year risk of hip fracture > 3% -Current treatment  No medications -Medications previously tried: none  -Recommend [phone] units of vitamin D daily. Recommend 1200 mg of calcium daily from dietary and supplemental sources. Recommend weight-bearing and muscle strengthening exercises for building and maintaining bone density. -Recommended starting calcium citrate 500 mg with vitamin D 1000 units daily.    Health Maintenance -Vaccine gaps: none -Current therapy:  Preservision Areds 2 1 capsule twice daily Menthol as needed Systane eye drops as needed Tart cherry daily Turmeric 1 capsule daily -Educated on Herbal supplement research is limited and benefits usually cannot be proven -Patient is satisfied with current therapy and denies issues -Recommended to continue current medication  Patient Goals/Self-Care Activities Patient will:  - take medications as prescribed as evidenced by patient report and record review check glucose a few times a week, document, and provide at future appointments check blood pressure weekly, document, and provide at future appointments  Follow Up Plan: The care management team will reach out to the patient again over the next 14 days.        Medication Assistance: None required.  Patient affirms current coverage meets needs.  Compliance/Adherence/Medication fill history: Care Gaps: Shingrix, tetanus, COVID vaccine BP - 124/72 (07/14/21) A1c - 6.9% (07/14/21)  Star-Rating Drugs: Losartan 25 mg - Last filled 06/11/21 90 [facility] at [facility] Metformin 500 mg - Last filled 07/21/21 90 DS at [facility] Rosuvastatin 20 mg - Last filled 06/11/21 90 DS at [facility]  Patient's preferred pharmacy  is:  [facility] [place], [place] - [address] AT [place] [address] Phone: [phone] Fax: [phone]  [facility] ([place], [place] [facility] [address] [place] [address] [place] [address] Phone: [phone] Fax: [phone]  [facility] ([facility] ) - [place], [place] [address] Phone: [phone] Fax: [place] [phone] - [address], [place] [address] Phone: [phone] Fax: [phone]  Uses pill box? Yes - fills 2 boxes at a time Pt endorses 100% compliance  We discussed: Current pharmacy is preferred with insurance plan and patient is satisfied with pharmacy services Patient decided to: Continue current medication management strategy  Care Plan and Follow Up Patient Decision:  Patient agrees to Care Plan and Follow-up.  Plan: The care management team will reach out to the patient again over the next 14 days.  Jeni Salles, PharmD, [place] Pharmacist [place] [facility] [place]

## 2021-08-04 NOTE — Progress Notes (Signed)
Chronic Care Management Pharmacy Assistant   Name: Anna Gamble  MRN: [id number] DOB: Jan 01, 1936  Reason for Encounter: Chart prep for initial encounter with Jeni Salles Clinical Pharmacist on 08/05/21 at 11 am via phone call.   Conditions to be addressed/monitored: CAD, HTN, HLD, and GERD  Recent office visits:  07/14/21  [name], [name], MD - Patient presented for Diabetes Mellitus type 2 in obese and other concerns. Recommended XR Metformin. No medication changes.  05/07/21 [name], [name], MD - Patient presented via video for Diabetes mellitus type 2 and other concerns. Increased Metformin  500 mg to twice daily.  04/13/21 [name], [name], MD - Patient presented for diabetes mellitus and other concerns. Plan to Increase Metformin pending labs.  Recent consult visits:  06/26/21 Lelon Perla, MD (Cardiology) - Patient presented for S/P TAVR and other concerns. No medication changes.  Hospital visits:  None in previous 6 months  Medications: Outpatient Encounter Medications as of 08/04/2021  Medication Sig   aspirin 81 MG tablet Take 81 mg by mouth daily.   hydrochlorothiazide (MICROZIDE) 12.5 MG capsule TAKE 1 CAPSULE BY MOUTH DAILY   Lancets (ACCU-CHEK SOFT TOUCH) lancets Use as instructed   losartan (COZAAR) 25 MG tablet TAKE 2 TABLETS BY MOUTH DAILY   Menthol, Topical Analgesic, (BENGAY EX) Apply 1 application topically daily as needed (back pain).   metFORMIN (GLUCOPHAGE) 500 MG tablet Take 1 tablet (500 mg total) by mouth 2 (two) times daily with a meal.   metoprolol succinate (TOPROL-XL) 50 MG 24 hr tablet TAKE 1 TABLET BY MOUTH  DAILY   Misc Natural Products (TART CHERRY ADVANCED PO) Take 1 capsule by mouth daily.    Multiple Vitamins-Minerals (PRESERVISION AREDS 2+MULTI VIT PO) Take by mouth.   pantoprazole (PROTONIX) 40 MG tablet Take 1 tablet (40 mg total) by mouth daily.   Polyethyl Glycol-Propyl Glycol (SYSTANE) 0.4-0.3 % GEL ophthalmic gel Place 1  application into both eyes 2 (two) times daily.   rosuvastatin (CRESTOR) 20 MG tablet Take 1 tablet (20 mg total) by mouth daily.   No facility-administered encounter medications on file as of 08/04/2021.   Fill History : HYDROCHLOROTHIAZIDE  12.5 MG CAPS 06/11/2021 90 90 capsule   LANCETS 12/19/2018 30 100 each   LOSARTAN POTASSIUM  25 MG TABS 06/11/2021 90 180 tablet   METFORMIN HYDROCHLORIDE  500 MG TABS 07/21/2021 90 90 tablet   PANTOPRAZOLE '40MG'$  TABLETS 08/14/2018 90 90 each   ROSUVASTATIN CALCIUM  20 MG TABS 06/11/2021 90 90 tablet      Have you seen any other providers since your last visit? **no  Any changes in your medications or health? Patient reports none, she reports she has had a recent fall where she had to call the fire dept to help her up, she reports no breaks just bruising and he has an upcoming follow up appointment with hip doctor.  Any side effects from any medications? Patient reports none  Do you have an symptoms or problems not managed by your medications? Patient reports she has been unable to regulate her sugars and is reluctant to increase her dosage with her metformin  Any concerns about your health right now? Patient reports her sugars and weight gain.  Do you get any type of exercise on a regular basis? Patient reports she has difficulty with her walking.  Can you think of a goal you would like to reach for your health? Patient reports none at this time other than her  weight and sugars being better.  Do you have any problems getting your medications? Patient reports cost is good is zero copay as long as she gets generic, she reports past issues with [facility] and getting her refills.  Is there anything that you would like to discuss during the appointment? Patient reports none.  Patient confirmed her number to be reached is her preferred number in chart.   Care Gaps: BP- 124/72 07/14/21 AWV- 7/22 Lab Results  Component Value Date   HGBA1C 6.9  (A) 07/14/2021    Star Rating Drugs: Losartan 25 mg - Last filled 06/11/21 90 DS at [facility] Metformin 500 mg - Last filled 07/21/21 90 DS at [facility] Rosuvastatin 20 mg - Last filled 06/11/21 90 DS at [facility] Pharmacist Assistant [phone]

## 2021-08-05 ENCOUNTER — Ambulatory Visit (INDEPENDENT_AMBULATORY_CARE_PROVIDER_SITE_OTHER): Payer: Medicare Other | Admitting: Pharmacist

## 2021-08-05 DIAGNOSIS — I10 Essential (primary) hypertension: Secondary | ICD-10-CM

## 2021-08-05 DIAGNOSIS — E1169 Type 2 diabetes mellitus with other specified complication: Secondary | ICD-10-CM

## 2021-08-05 NOTE — Patient Instructions (Signed)
Hi Anna Gamble,  It was great to get to meet you over the telephone! Below is a summary of some of the topics we discussed.   Don't forget to start taking  calcium citrate 500 mg with vitamin D 1000 units daily like we discussed to help build up your bones.  Please reach out to me if you have any questions or need anything!  Best, Maddie  Jeni Salles, PharmD, [facility]   Visit Information   Goals Addressed   None    Patient Care Plan: CCM Pharmacy Care Plan     Problem Identified: Problem: Hypertension, Hyperlipidemia, Diabetes, Coronary Artery Disease, and GERD      Long-Range Goal: Patient-Specific Goal   Start Date: 08/05/2021  Expected End Date: 08/06/2022  This Visit's Progress: On track  Priority: High  Note:   Current Barriers:  Unable to independently monitor therapeutic efficacy  Pharmacist Clinical Goal(s):  Patient will achieve adherence to monitoring guidelines and medication adherence to achieve therapeutic efficacy through collaboration with PharmD and provider.   Interventions: 1:1 collaboration with [name], [name], MD regarding development and update of comprehensive plan of care as evidenced by provider attestation and co-signature Inter-disciplinary care team collaboration (see longitudinal plan of care) Comprehensive medication review performed; medication list updated in electronic medical record  Hypertension (BP goal <140/90) -Controlled -Current treatment: Losartan 25 mg 2 tablets daily - Appropriate, Effective, Safe, Accessible HCTZ 12.5 mg 1 capsule daily -  Appropriate, Effective, Safe, Accessible Metoprolol succinate 50 mg 1 tablet daily - Appropriate, Effective, Safe, Accessible -Medications previously tried: unknown  -Current home readings: 130/72 - has an arm cuff; checks 1-2 times a week -Current dietary habits: doesn't really use much salt when cooking; does read label for  carbs but not much with salt; frozen meals some and canned  -Current exercise habits: unable to do much -Reports hypotensive/hypertensive symptoms  -Educated on BP goals and benefits of medications for prevention of heart attack, stroke and kidney damage; Daily salt intake goal < 2300 mg; Importance of home blood pressure monitoring; Symptoms of hypotension and importance of maintaining adequate hydration; -Counseled to monitor BP at home weekly, document, and provide log at future appointments -Counseled on diet and exercise extensively Recommended to continue current medication  Hyperlipidemia: (LDL goal < 70) -Not ideally controlled -Current treatment: Rosuvastatin 20 mg 1 tablet daily - Appropriate, Query effective, Safe, Accessible -Medications previously tried: couldn't tolerate 40 mg dose (myalgias)  -Current dietary patterns: trying to limit carb intake  -Current exercise habits: minimal -Educated on Cholesterol goals;  Importance of limiting foods high in cholesterol; Exercise goal of 150 minutes per week; -Counseled on diet and exercise extensively Recommended to continue current medication  Diabetes (A1c goal <7%) -Controlled -Current medications: Metformin 500 mg 1 tablet twice daily - Appropriate, Effective, Safe, Accessible -Medications previously tried: none  -Current home glucose readings fasting glucose: 129 -188 (checks first thing in the morning) post prandial glucose: > 200 after meals -Denies hypoglycemic/hyperglycemic symptoms -Current meal patterns:  breakfast: egg and oatmeal  lunch: not eating much meat  dinner: eating more vegetables and fruit  snacks: not snacking much, eating less food drinks: water, diet soda -Current exercise: can't walk and can't exercise much and had a knee replacement, has a recumbent bike and cannot use with her knee -Educated on A1c and blood sugar goals; Complications of diabetes including kidney damage, retinal damage, and  cardiovascular disease; Benefits of routine self-monitoring of blood sugar; -  Counseled to check feet daily and get yearly eye exams -Recommended switching metformin to Ozempic.  GERD (Goal: minimize symptoms) -Controlled -Current treatment  Tums as needed - Appropriate, Effective, Safe, Accessible -Medications previously tried:  pantoprazole (no longer needed)  -Counseled on non-pharmacologic management of symptoms such as elevating the head of your bed, avoiding eating 2-3 hours before bed, avoiding triggering foods such as acidic, spicy, or fatty foods, eating smaller meals, and wearing clothes that are loose around the waist  CAD (Goal: prevent heart events) -Controlled -Current treatment  Rosuvastatin 20 mg 1 tablet daily - Appropriate, Effective, Safe, Accessible Aspirin 81 mg 1 tablet daily - Appropriate, Effective, Safe, Accessible -Medications previously tried: none  -Recommended to continue current medication Counseled on limiting use of NSAIDs with use of aspirin.   Osteopenia (Goal prevent fractures) -Uncontrolled -Last DEXA Scan: 2020   T-Score femoral neck: -2.2  T-Score total hip: -1.9  T-Score lumbar spine: n/a  T-Score forearm radius: n/a  10-year probability of major osteoporotic fracture: 14.3%  10-year probability of hip fracture: 4.3% -Patient is a candidate for pharmacologic treatment due to T-Score -1.0 to -2.5 and 10-year risk of hip fracture > 3% -Current treatment  No medications -Medications previously tried: none  -Recommend [phone] units of vitamin D daily. Recommend 1200 mg of calcium daily from dietary and supplemental sources. Recommend weight-bearing and muscle strengthening exercises for building and maintaining bone density. -Recommended starting calcium citrate 500 mg with vitamin D 1000 units daily.    Health Maintenance -Vaccine gaps: none -Current therapy:  Preservision Areds 2 1 capsule twice daily Menthol as needed Systane eye drops  as needed Tart cherry daily Turmeric 1 capsule daily -Educated on Herbal supplement research is limited and benefits usually cannot be proven -Patient is satisfied with current therapy and denies issues -Recommended to continue current medication  Patient Goals/Self-Care Activities Patient will:  - take medications as prescribed as evidenced by patient report and record review check glucose a few times a week, document, and provide at future appointments check blood pressure weekly, document, and provide at future appointments  Follow Up Plan: The care management team will reach out to the patient again over the next 14 days.       Anna Gamble was given information about Chronic Care Management services today including:  CCM service includes personalized support from designated clinical staff supervised by her physician, including individualized plan of care and coordination with other care providers 24/7 contact phone numbers for assistance for urgent and routine care needs. Standard insurance, coinsurance, copays and deductibles apply for chronic care management only during months in which we provide at least 20 minutes of these services. Most insurances cover these services at 100%, however patients may be responsible for any copay, coinsurance and/or deductible if applicable. This service may help you avoid the need for more expensive face-to-face services. Only one practitioner may furnish and bill the service in a calendar month. The patient may stop CCM services at any time (effective at the end of the month) by phone call to the office staff.  Patient agreed to services and verbal consent obtained.   Patient verbalizes understanding of instructions and care plan provided today and agrees to view in [place]. Active MyChart status and patient understanding of how to access instructions and care plan via MyChart confirmed with patient.    The pharmacy team will reach out to the  patient again over the next 14 days.   Viona Gilmore, [facility]

## 2021-08-08 ENCOUNTER — Emergency Department (HOSPITAL_BASED_OUTPATIENT_CLINIC_OR_DEPARTMENT_OTHER): Payer: Medicare Other

## 2021-08-08 ENCOUNTER — Emergency Department (HOSPITAL_BASED_OUTPATIENT_CLINIC_OR_DEPARTMENT_OTHER)
Admission: EM | Admit: 2021-08-08 | Discharge: 2021-08-08 | Disposition: A | Discharge status: Home / Self Care | Payer: Medicare Other | Attending: Emergency Medicine | Admitting: Emergency Medicine

## 2021-08-08 ENCOUNTER — Encounter (HOSPITAL_BASED_OUTPATIENT_CLINIC_OR_DEPARTMENT_OTHER): Payer: Self-pay

## 2021-08-08 DIAGNOSIS — R296 Repeated falls: Secondary | ICD-10-CM | POA: Diagnosis not present

## 2021-08-08 DIAGNOSIS — R102 Pelvic and perineal pain: Secondary | ICD-10-CM | POA: Diagnosis not present

## 2021-08-08 DIAGNOSIS — M533 Sacrococcygeal disorders, not elsewhere classified: Secondary | ICD-10-CM | POA: Diagnosis not present

## 2021-08-08 DIAGNOSIS — M546 Pain in thoracic spine: Secondary | ICD-10-CM | POA: Insufficient documentation

## 2021-08-08 DIAGNOSIS — W19XXXA Unspecified fall, initial encounter: Secondary | ICD-10-CM | POA: Diagnosis not present

## 2021-08-08 DIAGNOSIS — R109 Unspecified abdominal pain: Secondary | ICD-10-CM | POA: Diagnosis not present

## 2021-08-08 DIAGNOSIS — R531 Weakness: Secondary | ICD-10-CM | POA: Diagnosis not present

## 2021-08-08 DIAGNOSIS — I700 Atherosclerosis of aorta: Secondary | ICD-10-CM | POA: Diagnosis not present

## 2021-08-08 LAB — PROTIME-INR
INR: 1 (ref 0.8–1.2)
Prothrombin Time: 12.9 seconds (ref 11.4–15.2)

## 2021-08-08 LAB — CBC WITH DIFFERENTIAL/PLATELET
Abs Immature Granulocytes: 0.02 10*3/uL (ref 0.00–0.07)
Basophils Absolute: 0 10*3/uL (ref 0.0–0.1)
Basophils Relative: 0 %
Eosinophils Absolute: 0.1 10*3/uL (ref 0.0–0.5)
Eosinophils Relative: 1 %
HCT: 37.7 % (ref 36.0–46.0)
Hemoglobin: 12.6 g/dL (ref 12.0–15.0)
Immature Granulocytes: 0 %
Lymphocytes Relative: 23 %
Lymphs Abs: 2 10*3/uL (ref 0.7–4.0)
MCH: 32.1 pg (ref 26.0–34.0)
MCHC: 33.4 g/dL (ref 30.0–36.0)
MCV: 95.9 fL (ref 80.0–100.0)
Monocytes Absolute: 0.9 10*3/uL (ref 0.1–1.0)
Monocytes Relative: 10 %
Neutro Abs: 5.8 10*3/uL (ref 1.7–7.7)
Neutrophils Relative %: 66 %
Platelets: 192 10*3/uL (ref 150–400)
RBC: 3.93 MIL/uL (ref 3.87–5.11)
RDW: 13.2 % (ref 11.5–15.5)
WBC: 8.9 10*3/uL (ref 4.0–10.5)
nRBC: 0 % (ref 0.0–0.2)

## 2021-08-08 LAB — COMPREHENSIVE METABOLIC PANEL
ALT: 18 U/L (ref 0–44)
AST: 15 U/L (ref 15–41)
Albumin: 4 g/dL (ref 3.5–5.0)
Alkaline Phosphatase: 60 U/L (ref 38–126)
Anion gap: 10 (ref 5–15)
BUN: 14 mg/dL (ref 8–23)
CO2: 30 mmol/L (ref 22–32)
Calcium: 9.7 mg/dL (ref 8.9–10.3)
Chloride: 102 mmol/L (ref 98–111)
Creatinine, Ser: 0.81 mg/dL (ref 0.44–1.00)
GFR, Estimated: >60 mL/min (ref >60)
Glucose, Bld: 168 mg/dL — ABNORMAL HIGH (ref 70–99)
Potassium: 3.9 mmol/L (ref 3.5–5.1)
Sodium: 142 mmol/L (ref 135–145)
Total Bilirubin: 0.6 mg/dL (ref 0.3–1.2)
Total Protein: 6.7 g/dL (ref 6.5–8.1)

## 2021-08-08 LAB — URINALYSIS, ROUTINE W REFLEX MICROSCOPIC
Bilirubin Urine: NEGATIVE
Glucose, UA: NEGATIVE mg/dL
Hgb urine dipstick: NEGATIVE
Ketones, ur: NEGATIVE mg/dL
Leukocytes,Ua: NEGATIVE
Nitrite: NEGATIVE
Protein, ur: NEGATIVE mg/dL
Specific Gravity, Urine: 1.046 — ABNORMAL HIGH (ref 1.005–1.030)
pH: 6.5 (ref 5.0–8.0)

## 2021-08-08 LAB — TROPONIN I (HIGH SENSITIVITY)
Troponin I (High Sensitivity): 4 ng/L (ref <18)
Troponin I (High Sensitivity): 4 ng/L (ref <18)

## 2021-08-08 MED ORDER — IOHEXOL 300 MG/ML  SOLN
100.0000 mL | Freq: Once | INTRAMUSCULAR | Status: AC | PRN
  Administered 2021-08-08: 100 mL via INTRAVENOUS

## 2021-08-08 NOTE — ED Triage Notes (Signed)
She reports two recent falls (did not pass out). She states the most recent fal was two days ago, a which time she "fell flat on my bottom". She is here today with c/o pain at groin/perineum areas.

## 2021-08-08 NOTE — Discharge Instructions (Addendum)
1.  Your x-rays did not show any broken bones.  You also had a CT scan done.  This examined your internal organs through your abdomen and pelvis.  This test also does more extensive evaluation of the bones in your hips and pelvis.  No broken bones were identified.  You are continue to have a significant amount of pain.  This may be due to bruising and muscular strain.  Try to maintain a comfortable position and you may take extra strength Tylenol 1 to 2 tablets every 6 hours for pain.  Try to stay well-hydrated.  You might try heating pads on your low back or abdomen.  Pay close attention not to get burned. 2.  See your doctor for recheck within the next 2 to 3 days. 3.  Return to the emergency department immediately if you are getting fever, worsening pain, weakness or numbness unusual from your baseline or other concerning symptoms.

## 2021-08-08 NOTE — ED Provider Notes (Signed)
[facility]PT Provider Note   CSN: [id number] Arrival date & time: 08/08/21  0831     History  Chief Complaint  Patient presents with   Anna Gamble is a 86 y.o. female.  HPI Patient reports that she has history of frequent falls.  She reports she does not lose consciousness but seems to have imbalance.  He typically uses a rolling walker that she places her arms on for additional support.  The patient reports that she fell about a week ago and bruised her right hip.  However, she had x-rays done and was not having excessive amounts of pain.  She was up and ambulatory again.  Patient reports she had another fall day before yesterday.  She reports she was in her house in the hallway and went down directly on her buttocks.  She reports when she fell she felt like at 2 x 4 when into her bottom with sudden pain.  However, once family members were able to assist her to standing she was able to ambulate and did not feel that the pain was as severe.  She reports however now a day later the pain is much more severe again and she has a feeling of pain deep in her groin, buttocks and vagina.  Reports the pain is worse with certain movements.  She does not have numbness into the legs.  She does not feel that she has any increased weakness into her legs.  Patient denies head injury.  She does not have headache.  She reports she does have some central midline thoracic pain.  She reports she had history of problems with her discs and spine before as well.  She reports in certain positions she feels like she did feel some pain radiating from that area as well.  Patient takes a daily aspirin but otherwise is not on anticoagulants.  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  She reports since she had the TAVR about 3 years ago, her breathing is much better and her energy level is better, however she feels like since that time her general functional strength  and balance is worse, and if she falls down she cannot get back up again.    Home Medications Prior to Admission medications   Medication Sig Start Date End Date Taking? Authorizing Provider  aspirin 81 MG tablet Take 81 mg by mouth daily. Patient not taking: Reported on 08/05/2021    [provider]  hydrochlorothiazide (MICROZIDE) 12.5 MG capsule TAKE 1 CAPSULE BY MOUTH DAILY 05/13/21   Lelon Perla, MD  Lancets (ACCU-CHEK SOFT TOUCH) lancets Use as instructed 11/22/18   [name], [name], MD  losartan (COZAAR) 25 MG tablet TAKE 2 TABLETS BY MOUTH DAILY 05/13/21   Lelon Perla, MD  Menthol, Topical Analgesic, (BENGAY EX) Apply 1 application topically daily as needed (back pain).    [provider]  metFORMIN (GLUCOPHAGE) 500 MG tablet Take 1 tablet (500 mg total) by mouth 2 (two) times daily with a meal. 05/07/21   [name], [name], MD  metoprolol succinate (TOPROL-XL) 50 MG 24 hr tablet TAKE 1 TABLET BY MOUTH  DAILY 07/10/21   Lelon Perla, MD  Misc Natural Products (TART CHERRY ADVANCED PO) Take 1 capsule by mouth daily.     [provider]  Multiple Vitamins-Minerals (PRESERVISION AREDS 2+MULTI VIT PO) Take by mouth.    [provider]  Polyethyl Glycol-Propyl Glycol (SYSTANE)  0.4-0.3 % GEL ophthalmic gel Place 1 application into both eyes 2 (two) times daily.    [provider]  rosuvastatin (CRESTOR) 20 MG tablet Take 1 tablet (20 mg total) by mouth daily. 12/23/20 08/05/21  Lelon Perla, MD  Turmeric 500 MG CAPS Take 500 mg by mouth daily.    [provider]      Allergies    Lisinopril and Statins    Review of Systems   Review of Systems 10 systems reviewed negative except as per HPI Physical Exam Updated Vital Signs BP (!) 90/44   Pulse 65   Temp 98 F (36.7 C) (Oral)   Resp 18   SpO2 98%  Physical Exam Constitutional:      Comments: Patient is alert and nontoxic.  Mental status clear.  No respiratory  distress at rest  HENT:     Head: Normocephalic and atraumatic.     Mouth/Throat:     Pharynx: Oropharynx is clear.  Eyes:     Extraocular Movements: Extraocular movements intact.     Pupils: Pupils are equal, round, and reactive to light.  Neck:     Comments: No midline C-spine tenderness Cardiovascular:     Rate and Rhythm: Normal rate and regular rhythm.  Pulmonary:     Effort: Pulmonary effort is normal.     Breath sounds: Normal breath sounds.  Chest:     Chest wall: No tenderness.  Abdominal:     General: There is no distension.     Palpations: Abdomen is soft.     Tenderness: There is no abdominal tenderness. There is no guarding.  Genitourinary:    Comments: Normal external female genitalia.  No contusions or hematomas.  Visualization of vaginal introitus does not have any mass fullness or drainage.  Normal in appearance.  Perianal area without unusual bruising or swelling.  Digital exam is for soft mobile stool in the vault that is brown.  No masses fullness or tenderness in the rectum. Musculoskeletal:     Comments: There is an older resolving bruise over the right trochanter.  The lower extremities are symmetric.  No shortening or rotation.  No effusions or deformities of the knees or ankles.  Patient can flex and extend at both ankles with symmetric strength.  Both extremities are warm and dry  Skin:    General: Skin is warm and dry.  Neurological:     General: No focal deficit present.     Mental Status: She is oriented to person, place, and time.     Coordination: Coordination normal.  Psychiatric:        Mood and Affect: Mood normal.     ED Results / Procedures / Treatments   Labs (all labs ordered are listed, but only abnormal results are displayed) Labs Reviewed  COMPREHENSIVE METABOLIC PANEL - Abnormal; Notable for the following components:      Result Value   Glucose, Bld 168 (*)    All other components within normal limits  URINALYSIS, ROUTINE W REFLEX  MICROSCOPIC - Abnormal; Notable for the following components:   Color, Urine COLORLESS (*)    Specific Gravity, Urine 1.046 (*)    All other components within normal limits  CBC WITH DIFFERENTIAL/PLATELET  PROTIME-INR  TROPONIN I (HIGH SENSITIVITY)  TROPONIN I (HIGH SENSITIVITY)    EKG None  Radiology CT Abdomen Pelvis W Contrast  Result Date: 08/08/2021 CLINICAL DATA:  Fall, abdominal pain, pelvic pain EXAM: CT ABDOMEN AND PELVIS WITH CONTRAST TECHNIQUE: Multidetector  CT imaging of the abdomen and pelvis was performed using the standard protocol following bolus administration of intravenous contrast. RADIATION DOSE REDUCTION: This exam was performed according to the departmental dose-optimization program which includes automated exposure control, adjustment of the mA and/or kV according to patient size and/or use of iterative reconstruction technique. CONTRAST:  181m OMNIPAQUE IOHEXOL 300 MG/ML  SOLN COMPARISON:  03/26/2013 FINDINGS: Lower chest: No acute abnormality. Hepatobiliary: No focal liver abnormality is seen. Status post cholecystectomy. No biliary dilatation. Pancreas: Unremarkable. No pancreatic ductal dilatation or surrounding inflammatory changes. Spleen: Normal in size without significant abnormality. Adrenals/Urinary Tract: Adrenal glands are unremarkable. Kidneys are normal, without renal calculi, solid lesion, or hydronephrosis. Bladder is unremarkable. Stomach/Bowel: Stomach is within normal limits. Appendix is not clearly visualized and may be surgically absent. No evidence of bowel wall thickening, distention, or inflammatory changes. Descending and sigmoid diverticulosis. Vascular/Lymphatic: Aortic atherosclerosis. No enlarged abdominal or pelvic lymph nodes. Reproductive: No mass or other significant abnormality. Other: No abdominal wall hernia or abnormality. No ascites. Musculoskeletal: No acute or significant osseous findings. IMPRESSION: 1. No CT evidence of acute  traumatic injury to the abdomen or pelvis. Specifically, no displaced fracture or dislocation of the hips or pelvis. 2. Descending and sigmoid diverticulosis without evidence of acute diverticulitis. Aortic Atherosclerosis (ICD10-I70.0). Electronically Signed   By: ADelanna AhmadiM.D.   On: 08/08/2021 13:33   DG Pelvis Portable  Result Date: 08/08/2021 CLINICAL DATA:  Fall 3 days ago. Bilateral anterior hip pain and medial pelvis pain. EXAM: PORTABLE PELVIS 1-2 VIEWS COMPARISON:  CT abdomen and pelvis 03/26/2013 FINDINGS: There is diffuse decreased bone mineralization. Moderate bilateral femoroacetabular joint space narrowing. Mild bilateral sacroiliac joint space narrowing with moderate subchondral sclerosis. Moderate pubic symphysis joint space narrowing and subchondral sclerosis. No acute fracture is seen.  No dislocation. IMPRESSION: Moderate bilateral femoroacetabular, mild bilateral sacroiliac, and moderate pubic symphysis osteoarthritis. Electronically Signed   By: RYvonne KendallM.D.   On: 08/08/2021 11:14    Procedures Procedures    Medications Ordered in ED Medications  iohexol (OMNIPAQUE) 300 MG/ML solution 100 mL (100 mLs Intravenous Contrast Given 08/08/21 1259)    ED Course/ Medical Decision Making/ A&P                           Medical Decision Making Amount and/or Complexity of Data Reviewed Labs: ordered. Radiology: ordered.  Risk Prescription drug management.   Patient has significant comorbid medical conditions.  Patient also is physically deconditioned with obesity and baseline gait instability.  Patient reports that since a fall day before yesterday she is having severe pain in the groin and deep pelvis.  At this time differential includes pelvic fracture, deep soft tissue hematoma, deep musculoskeletal strain.  Given significant comorbid condition and weakness will also obtain basic lab work.  Will obtain a plain film pelvis for suspected pelvic ring fracture but  anticipate adding CT pelvis for further evaluation.  At this time, patient is comfortable at rest and does not wish to take additional pain medications.  Diagnostic imaging does not show acute fracture.  Radiology is reviewed pelvis x-ray without acute findings.  CT abdomen pelvis obtained without any evidence of fracture or internal hematoma or other etiology for pelvic girdle and perineal pain.  Urinalysis negative, CBC normal, comprehensive panel normal except for glucose slightly elevated at 168.  At this time no apparent fracture or internal hematoma etiology for patient's pelvic and perineal pain.  She  describes falling onto her buttocks and having sudden severe pain but then it abated.  Subsequently it seemed to come back with certain more movement driven pain.  At this time I suspect musculoskeletal strain and bruising.  CT scan obtained to rule out any pelvic hematomas or pelvic fractures.  Physical exam does not show any evidence of peritoneal hematoma, erythema or swelling.  There is no vaginal prolapse and no rectal masses.  At this time with grossly negative work-up and physical exam without concerning findings, plan will be for continued management at home.  At baseline patient has gait instability longstanding does use a walker.  I recommended extra strength Tylenol 1 to 2 tablets for pain control.  Patient reports she generally avoids any kind of pain medications although I reassured that use of Tylenol and this circumstance is appropriate for her.  I recommend close follow-up and strict return precautions reviewed.          Final Clinical Impression(s) / ED Diagnoses Final diagnoses:  Frequent falls  Pelvic pain in female    Rx / DC Orders ED Discharge Orders     None         Charlesetta Shanks, MD 08/08/21 1524

## 2021-08-08 NOTE — ED Notes (Signed)
Pt bladder scanned (3 times). Nothing in pt's bladder. Informed Zenia Resides - RN.

## 2021-08-11 ENCOUNTER — Ambulatory Visit (INDEPENDENT_AMBULATORY_CARE_PROVIDER_SITE_OTHER): Payer: Medicare Other

## 2021-08-11 ENCOUNTER — Ambulatory Visit: Payer: Medicare Other

## 2021-08-11 VITALS — Ht 62.0 in | Wt 220.0 lb

## 2021-08-11 DIAGNOSIS — Z0000 Encounter for general adult medical examination without abnormal findings: Secondary | ICD-10-CM

## 2021-08-11 NOTE — Progress Notes (Signed)
I connected with Anna Gamble today by telephone and verified that I am speaking with the correct person using two identifiers. Location patient: home Location provider: work Persons participating in the virtual visit: Kailynn Satterly, Glenna Durand LPN.   I discussed the limitations, risks, security and privacy concerns of performing an evaluation and management service by telephone and the availability of in person appointments. I also discussed with the patient that there may be a patient responsible charge related to this service. The patient expressed understanding and verbally consented to this telephonic visit.    Interactive audio and video telecommunications were attempted between this provider and patient, however failed, due to patient having technical difficulties OR patient did not have access to video capability.  We continued and completed visit with audio only.     Vital signs may be patient reported or missing.  Subjective:   Anna Gamble is a 86 y.o. female who presents for Medicare Annual (Subsequent) preventive examination.  Review of Systems     Cardiac Risk Factors include: advanced age (>63mn, >>29women);diabetes mellitus;dyslipidemia;hypertension;obesity (BMI >30kg/m2)     Objective:    Today's Vitals   08/11/21 1226 08/11/21 1227  Weight: 220 lb (99.8 kg)   Height: '5\' 2"'$  (1.575 m)   PainSc:  3    Body mass index is 40.24 kg/m.     08/11/2021   12:41 PM 08/08/2021    8:40 AM 07/29/2020    1:15 PM 07/24/2019    1:44 PM 06/02/2018    2:09 PM 05/31/2018    8:02 AM 04/06/2018    7:19 AM  Advanced Directives  Does Patient Have a Medical Advance Directive? No No Yes No No No No  Type of Advance Directive   HGroveportin Chart?   No - copy requested      Would patient like information on creating a medical advance directive?  No - Patient declined  No - Patient declined No - Patient declined  No - Patient declined No - Patient declined    Current Medications (verified) Outpatient Encounter Medications as of 08/11/2021  Medication Sig   aspirin 81 MG tablet Take 81 mg by mouth daily.   hydrochlorothiazide (MICROZIDE) 12.5 MG capsule TAKE 1 CAPSULE BY MOUTH DAILY   Lancets (ACCU-CHEK SOFT TOUCH) lancets Use as instructed   losartan (COZAAR) 25 MG tablet TAKE 2 TABLETS BY MOUTH DAILY   Menthol, Topical Analgesic, (BENGAY EX) Apply 1 application topically daily as needed (back pain).   metFORMIN (GLUCOPHAGE) 500 MG tablet Take 1 tablet (500 mg total) by mouth 2 (two) times daily with a meal.   metoprolol succinate (TOPROL-XL) 50 MG 24 hr tablet TAKE 1 TABLET BY MOUTH  DAILY   Misc Natural Products (TART CHERRY ADVANCED PO) Take 1 capsule by mouth daily.    Multiple Vitamins-Minerals (PRESERVISION AREDS 2+MULTI VIT PO) Take by mouth.   Polyethyl Glycol-Propyl Glycol (SYSTANE) 0.4-0.3 % GEL ophthalmic gel Place 1 application into both eyes 2 (two) times daily.   Turmeric 500 MG CAPS Take 500 mg by mouth daily.   rosuvastatin (CRESTOR) 20 MG tablet Take 1 tablet (20 mg total) by mouth daily.   No facility-administered encounter medications on file as of 08/11/2021.    Allergies (verified) Lisinopril and Statins   History: Past Medical History:  Diagnosis Date   CAD (coronary artery disease) 03/19/2014   Colon polyp    Coronary artery  disease    GERD (gastroesophageal reflux disease)    Hx of colonoscopy with polypectomy 07/14/2012   medoff    Hyperlipidemia    Hypertension    Impaired fasting glucose    Morbid obesity (HCC)    S/P TAVR (transcatheter aortic valve replacement)    Severe aortic stenosis    Past Surgical History:  Procedure Laterality Date   APPENDECTOMY     CHOLECYSTECTOMY     FOOT SURGERY     rt   KNEE SURGERY     rt   LEFT AND RIGHT HEART CATHETERIZATION WITH CORONARY ANGIOGRAM N/A 10/10/2013   Procedure: LEFT AND RIGHT HEART CATHETERIZATION WITH  CORONARY ANGIOGRAM;  Surgeon: Burnell Blanks, MD;  Location: [facility] CATH LAB;  Service: Cardiovascular;  Laterality: N/A;   RIGHT/LEFT HEART CATH AND CORONARY ANGIOGRAPHY N/A 04/06/2018   Procedure: RIGHT/LEFT HEART CATH AND CORONARY ANGIOGRAPHY;  Surgeon: Burnell Blanks, MD;  Location: [place] CV LAB;  Service: Cardiovascular;  Laterality: N/A;   TEE WITHOUT CARDIOVERSION N/A 06/06/2018   Procedure: TRANSESOPHAGEAL ECHOCARDIOGRAM (TEE);  Surgeon: Burnell Blanks, MD;  Location: [place];  Service: Open Heart Surgery;  Laterality: N/A;   TONSILLECTOMY     TOTAL KNEE ARTHROPLASTY  02/01/2011   Procedure: TOTAL KNEE ARTHROPLASTY;  Surgeon: Kerin Salen;  Location: [place];  Service: Orthopedics;  Laterality: Right;  DEPUY/SIGMA   TRANSCATHETER AORTIC VALVE REPLACEMENT, TRANSFEMORAL N/A 06/06/2018   Procedure: TRANSCATHETER AORTIC VALVE REPLACEMENT, TRANSFEMORAL;  Surgeon: Burnell Blanks, MD;  Location: [place];  Service: Open Heart Surgery;  Laterality: N/A;   Family History  Problem Relation Age of Onset   Coronary artery disease Other        female- 1st degree relative   Coronary artery disease Other        female- 1st degree relative   Hypertension Other    Hyperlipidemia Other    Heart attack Mother        age 48's   Heart attack Father        age 21's   Stroke Brother        died    Hearing loss Son    Sleep apnea Son    Aortic stenosis Sister    Stroke Brother    Heart attack Brother    Rheum arthritis Sister    Osteoarthritis Sister    Arthritis Sister    Heart Problems Brother    Social History   Socioeconomic History   Marital status: Married    Spouse name: Not on file   Number of children: 4   Years of education: Not on file   Highest education level: Not on file  Occupational History   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   Occupation: Therapist, sports, peds, cardiology    Comment: retired  Tobacco Use   Smoking status:  Never   Smokeless tobacco: Never  Vaping Use   Vaping Use: Never used  Substance and Sexual Activity   Alcohol use: No   Drug use: No   Sexual activity: Not Currently    Comment: quit 40 yrs ago  Other Topics Concern   Not on file  Social History Narrative   hh of 3 -4  Married   [place] son   At home    Invalid son paraplegia and husband    And grandson recently     No pets   Fluctuating hh membors she cooks and prepares for them  Social Determinants of Health   Financial Resource Strain: Low Risk  (08/11/2021)   Overall Financial Resource Strain (CARDIA)    Difficulty of Paying Living Expenses: Not hard at all  Food Insecurity: No Food Insecurity (08/11/2021)   Hunger Vital Sign    Worried About Running Out of Food in the Last Year: Never true    Ran Out of Food in the Last Year: Never true  Transportation Needs: No Transportation Needs (08/11/2021)   PRAPARE - Hydrologist (Medical): No    Lack of Transportation (Non-Medical): No  Physical Activity: Inactive (08/11/2021)   Exercise Vital Sign    Days of Exercise per Week: 0 days    Minutes of Exercise per Session: 0 min  Stress: No Stress Concern Present (08/11/2021)   [place]    Feeling of Stress : Not at all  Social Connections: [place] (07/29/2020)   Social Connection and Isolation Panel [NHANES]    Frequency of Communication with Friends and Family: More than three times a week    Frequency of Social Gatherings with Friends and Family: Once a week    Attends Religious Services: 1 to 4 times per year    Active Member of [employer] or Organizations: Yes    Attends Archivist Meetings: 1 to 4 times per year    Marital Status: Married    Tobacco Counseling Counseling given: Not Answered   Clinical Intake:  Pre-visit preparation completed: Yes  Pain : 0-10 Pain Score: 3  Pain Type: Acute  pain Pain Location: Sacrum Pain Descriptors / Indicators: Aching Pain Onset: More than a month ago Pain Frequency: Constant     Nutritional Status: BMI > 30  Obese Nutritional Risks: Nausea/ vomitting/ diarrhea (some nausea and vomiting last month) Diabetes: Yes  How often do you need to have someone help you when you read instructions, pamphlets, or other written materials from your doctor or pharmacy?: 1 - Never What is the last grade level you completed in school?: 48yr college  Diabetic? Yes Nutrition Risk Assessment:  Has the patient had any N/V/D within the last 2 months?  Yes  Does the patient have any non-healing wounds?  No  Has the patient had any unintentional weight loss or weight gain?  No   Diabetes:  Is the patient diabetic?  Yes  If diabetic, was a CBG obtained today?  No  Did the patient bring in their glucometer from home?  No  How often do you monitor your CBG's? daily.   Financial Strains and Diabetes Management:  Are you having any financial strains with the device, your supplies or your medication? No .  Does the patient want to be seen by Chronic Care Management for management of their diabetes?  No  Would the patient like to be referred to a Nutritionist or for Diabetic Management?  No   Diabetic Exams:  Diabetic Eye Exam: Overdue for diabetic eye exam. Pt has been advised about the importance in completing this exam. Patient advised to call and schedule an eye exam. Diabetic Foot Exam: Overdue, Pt has been advised about the importance in completing this exam. Pt is scheduled for diabetic foot exam on next appointment.   Interpreter Needed?: No  Information entered by :: NAllen LPN   Activities of Daily Living    08/11/2021   12:42 PM  In your present state of health, do you have any difficulty performing the  following activities:  Hearing? 1  Vision? 1  Comment gets blurry  Difficulty concentrating or making decisions? 1  Walking or  climbing stairs? 1  Dressing or bathing? 0  Doing errands, shopping? 1  Preparing Food and eating ? N  Using the Toilet? N  In the past six months, have you accidently leaked urine? Y  Do you have problems with loss of bowel control? N  Managing your Medications? N  Managing your Finances? N  Housekeeping or managing your Housekeeping? N    Patient Care Team: [name], [name], MD as PCP - General Melvyn Novas Christena Deem, MD (Pulmonary Disease) Almedia Balls, MD (Orthopedic Surgery) Richmond Campbell, MD (Gastroenterology) [name], MD as Attending Physician (Cardiology) Marica Otter, [employer] (Optometry) Viona Gilmore, [facility] as Pharmacist (Pharmacist)  Indicate any recent Medical Services you may have received from other than [facility] providers in the past year (date may be approximate).     Assessment:   This is a routine wellness examination for Jaleiyah.  Hearing/Vision screen Vision Screening - Comments:: Regular eye exams, [place] [facility]  Dietary issues and exercise activities discussed: Current Exercise Habits: The patient does not participate in regular exercise at present   Goals Addressed             This Visit's Progress    Patient Stated       08/11/2021, start exercising       Depression Screen    08/11/2021   12:42 PM 07/14/2021    9:41 AM 04/13/2021    9:57 AM 07/29/2020    1:12 PM 07/24/2019    1:40 PM 10/06/2018    8:32 AM 03/10/2018    2:45 PM  PHQ 2/9 Scores  PHQ - 2 Score 0 1 1 0 0 0 0  PHQ- 9 Score  '6 7    2    '$ Fall Risk    08/11/2021   12:41 PM 07/14/2021    9:33 AM 04/13/2021    9:57 AM 07/29/2020    1:16 PM 07/24/2019    1:46 PM  Fall Risk   Falls in the past year? 1 0 0 1 1  Comment loses balance      Number falls in past yr: 1 0 0 1 1  Comment    slid off bench   Injury with Fall? 1 0 0 0 0  Risk for fall due to : History of fall(s);Impaired balance/gait;Impaired mobility;Medication side effect No Fall Risks Other (Comment) Impaired vision;Impaired  balance/gait;Impaired mobility History of fall(s);Impaired balance/gait;Impaired mobility;Impaired vision  Follow up Falls evaluation completed;Education provided;Falls prevention discussed Falls prevention discussed Falls evaluation completed Falls prevention discussed Falls prevention discussed    FALL RISK PREVENTION PERTAINING TO THE HOME:  Any stairs in or around the home? No  If so, are there any without handrails?  ramp Home free of loose throw rugs in walkways, pet beds, electrical cords, etc? Yes  Adequate lighting in your home to reduce risk of falls? Yes   ASSISTIVE DEVICES UTILIZED TO PREVENT FALLS:  Life alert? No  Use of a cane, walker or w/c? Yes  Grab bars in the bathroom? Yes  Shower chair or bench in shower? Yes  Elevated toilet seat or a handicapped toilet? Yes   TIMED UP AND GO:  Was the test performed? No .       Cognitive Function:        08/11/2021   12:45 PM 07/29/2020    1:20 PM 07/24/2019  2:06 PM 11/09/2016    8:53 AM  6CIT Screen  What Year? 0 points 0 points 0 points 0 points  What month? 0 points 0 points 0 points 0 points  What time? 0 points 0 points 0 points 0 points  Count back from 20 0 points 0 points 0 points 0 points  Months in reverse 0 points 0 points 0 points 0 points  Repeat phrase 4 points 0 points 2 points 0 points  Total Score 4 points 0 points 2 points 0 points    Immunizations Immunization History  Administered Date(s) Administered   Fluad Quad(high Dose 65+) 10/06/2018, 01/10/2020   Influenza Split 09/18/2012   Influenza Whole 12/27/2006, 11/01/2007, 11/08/2008, 10/29/2009, 12/02/2011   Influenza, High Dose Seasonal PF 10/27/2016, 11/14/2017   Influenza-Unspecified 12/19/2014   PFIZER(Purple Top)SARS-COV-2 Vaccination 02/07/2019, 02/28/2019, 05/16/2020   Pneumococcal Conjugate-13 01/15/2013   Pneumococcal Polysaccharide-23 01/13/2002, 04/16/2015   Td 03/18/1996, 12/31/2009    TDAP status: Due, Education has been  provided regarding the importance of this vaccine. Advised may receive this vaccine at local pharmacy or Health Dept. Aware to provide a copy of the vaccination record if obtained from local pharmacy or Health Dept. Verbalized acceptance and understanding.  Flu Vaccine status: Up to date  Pneumococcal vaccine status: Up to date  Covid-19 vaccine status: Completed vaccines  Qualifies for Shingles Vaccine? Yes   Zostavax completed No   Shingrix Completed?: No.    Education has been provided regarding the importance of this vaccine. Patient has been advised to call insurance company to determine out of pocket expense if they have not yet received this vaccine. Advised may also receive vaccine at local pharmacy or Health Dept. Verbalized acceptance and understanding.  Screening Tests Health Maintenance  Topic Date Due   TETANUS/TDAP  10/14/2021 (Originally 01/01/2020)   COVID-19 Vaccine (4 - Booster for Pfizer series) 10/14/2021 (Originally 07/11/2020)   Zoster Vaccines- Shingrix (1 of 2) 10/14/2021 (Originally 08/29/1954)   INFLUENZA VACCINE  08/18/2021   MAMMOGRAM  10/22/2021   Pneumonia Vaccine 58+ Years old  Completed   DEXA SCAN  Completed   HPV VACCINES  Aged Out    Health Maintenance  There are no preventive care reminders to display for this patient.  Colorectal cancer screening: No longer required.   Mammogram status: Completed 10/22/2020. Repeat every year  Bone Density status: Completed 07/28/2018.   Lung Cancer Screening: (Low Dose CT Chest recommended if Age 31-80 years, 30 pack-year currently smoking OR have quit w/in 15years.) does not qualify.   Lung Cancer Screening Referral: no  Additional Screening:  Hepatitis C Screening: does not qualify;   Vision Screening: Recommended annual ophthalmology exams for early detection of glaucoma and other disorders of the eye. Is the patient up to date with their annual eye exam?  Yes  Who is the provider or what is the name  of the office in which the patient attends annual eye exams? [facility] If pt is not established with a provider, would they like to be referred to a provider to establish care? No .   Dental Screening: Recommended annual dental exams for proper oral hygiene  Community Resource Referral / Chronic Care Management: CRR required this visit?  No   CCM required this visit?  No      Plan:     I have personally reviewed and noted the following in the patient's chart:   Medical and social history Use of alcohol, tobacco or illicit drugs  Current medications  and supplements including opioid prescriptions.  Functional ability and status Nutritional status Physical activity Advanced directives List of other physicians Hospitalizations, surgeries, and ER visits in previous 12 months Vitals Screenings to include cognitive, depression, and falls Referrals and appointments  In addition, I have reviewed and discussed with patient certain preventive protocols, quality metrics, and best practice recommendations. A written personalized care plan for preventive services as well as general preventive health recommendations were provided to patient.     [name], LPN   1/61/0960   Nurse Notes: none  Due to this being a virtual visit, the after visit summary with patients personalized plan was offered to patient via mail or my-chart. Patient would like to access on my-chart

## 2021-08-11 NOTE — Patient Instructions (Signed)
Anna Gamble , Thank you for taking time to come for your Medicare Wellness Visit. I appreciate your ongoing commitment to your health goals. Please review the following plan we discussed and let me know if I can assist you in the future.   Screening recommendations/referrals: Colonoscopy: not required Mammogram: completed 10/22/2020, due 10/23/2021 Bone Density: completed 07/28/2018 Recommended yearly ophthalmology/optometry visit for glaucoma screening and checkup Recommended yearly dental visit for hygiene and checkup  Vaccinations: Influenza vaccine: due 08/18/2021 Pneumococcal vaccine: completed 04/16/2015 Tdap vaccine: due Shingles vaccine: discussed   Covid-19: 02/07/2019, 02/28/2019, 05/16/2020  Advanced directives: Advance directive discussed with you today.   Conditions/risks identified: none  Next appointment: Follow up in one year for your annual wellness visit    Preventive Care 65 Years and Older, Female Preventive care refers to lifestyle choices and visits with your health care provider that can promote health and wellness. What does preventive care include? A yearly physical exam. This is also called an annual well check. Dental exams once or twice a year. Routine eye exams. Ask your health care provider how often you should have your eyes checked. Personal lifestyle choices, including: Daily care of your teeth and gums. Regular physical activity. Eating a healthy diet. Avoiding tobacco and drug use. Limiting alcohol use. Practicing safe sex. Taking low-dose aspirin every day. Taking vitamin and mineral supplements as recommended by your health care provider. What happens during an annual well check? The services and screenings done by your health care provider during your annual well check will depend on your age, overall health, lifestyle risk factors, and family history of disease. Counseling  Your health care provider may ask you questions about your: Alcohol  use. Tobacco use. Drug use. Emotional well-being. Home and relationship well-being. Sexual activity. Eating habits. History of falls. Memory and ability to understand (cognition). Work and work Statistician. Reproductive health. Screening  You may have the following tests or measurements: Height, weight, and BMI. Blood pressure. Lipid and cholesterol levels. These may be checked every 5 years, or more frequently if you are over 64 years old. Skin check. Lung cancer screening. You may have this screening every year starting at age 65 if you have a 30-pack-year history of smoking and currently smoke or have quit within the past 15 years. Fecal occult blood test (FOBT) of the stool. You may have this test every year starting at age 67. Flexible sigmoidoscopy or colonoscopy. You may have a sigmoidoscopy every 5 years or a colonoscopy every 10 years starting at age 36. Hepatitis C blood test. Hepatitis B blood test. Sexually transmitted disease (STD) testing. Diabetes screening. This is done by checking your blood sugar (glucose) after you have not eaten for a while (fasting). You may have this done every 1-3 years. Bone density scan. This is done to screen for osteoporosis. You may have this done starting at age 73. Mammogram. This may be done every 1-2 years. Talk to your health care provider about how often you should have regular mammograms. Talk with your health care provider about your test results, treatment options, and if necessary, the need for more tests. Vaccines  Your health care provider may recommend certain vaccines, such as: Influenza vaccine. This is recommended every year. Tetanus, diphtheria, and acellular pertussis (Tdap, Td) vaccine. You may need a Td booster every 10 years. Zoster vaccine. You may need this after age 48. Pneumococcal 13-valent conjugate (PCV13) vaccine. One dose is recommended after age 9. Pneumococcal polysaccharide (PPSV23) vaccine. One dose is  recommended after age 63. Talk to your health care provider about which screenings and vaccines you need and how often you need them. This information is not intended to replace advice given to you by your health care provider. Make sure you discuss any questions you have with your health care provider. Document Released: 01/31/2015 Document Revised: 09/24/2015 Document Reviewed: 11/05/2014 Elsevier Interactive Patient Education  2017 [place] Prevention in the Home Falls can cause injuries. They can happen to people of all ages. There are many things you can do to make your home safe and to help prevent falls. What can I do on the outside of my home? Regularly fix the edges of walkways and driveways and fix any cracks. Remove anything that might make you trip as you walk through a door, such as a raised step or threshold. Trim any bushes or trees on the path to your home. Use bright outdoor lighting. Clear any walking paths of anything that might make someone trip, such as rocks or tools. Regularly check to see if handrails are loose or broken. Make sure that both sides of any steps have handrails. Any raised decks and porches should have guardrails on the edges. Have any leaves, snow, or ice cleared regularly. Use sand or salt on walking paths during winter. Clean up any spills in your garage right away. This includes oil or grease spills. What can I do in the bathroom? Use night lights. Install grab bars by the toilet and in the tub and shower. Do not use towel bars as grab bars. Use non-skid mats or decals in the tub or shower. If you need to sit down in the shower, use a plastic, non-slip stool. Keep the floor dry. Clean up any water that spills on the floor as soon as it happens. Remove soap buildup in the tub or shower regularly. Attach bath mats securely with double-sided non-slip rug tape. Do not have throw rugs and other things on the floor that can make you  trip. What can I do in the bedroom? Use night lights. Make sure that you have a light by your bed that is easy to reach. Do not use any sheets or blankets that are too big for your bed. They should not hang down onto the floor. Have a firm chair that has side arms. You can use this for support while you get dressed. Do not have throw rugs and other things on the floor that can make you trip. What can I do in the kitchen? Clean up any spills right away. Avoid walking on wet floors. Keep items that you use a lot in easy-to-reach places. If you need to reach something above you, use a strong step stool that has a grab bar. Keep electrical cords out of the way. Do not use floor polish or wax that makes floors slippery. If you must use wax, use non-skid floor wax. Do not have throw rugs and other things on the floor that can make you trip. What can I do with my stairs? Do not leave any items on the stairs. Make sure that there are handrails on both sides of the stairs and use them. Fix handrails that are broken or loose. Make sure that handrails are as long as the stairways. Check any carpeting to make sure that it is firmly attached to the stairs. Fix any carpet that is loose or worn. Avoid having throw rugs at the top or bottom of the stairs. If you  do have throw rugs, attach them to the floor with carpet tape. Make sure that you have a light switch at the top of the stairs and the bottom of the stairs. If you do not have them, ask someone to add them for you. What else can I do to help prevent falls? Wear shoes that: Do not have high heels. Have rubber bottoms. Are comfortable and fit you well. Are closed at the toe. Do not wear sandals. If you use a stepladder: Make sure that it is fully opened. Do not climb a closed stepladder. Make sure that both sides of the stepladder are locked into place. Ask someone to hold it for you, if possible. Clearly mark and make sure that you can  see: Any grab bars or handrails. First and last steps. Where the edge of each step is. Use tools that help you move around (mobility aids) if they are needed. These include: Canes. Walkers. Scooters. Crutches. Turn on the lights when you go into a dark area. Replace any light bulbs as soon as they burn out. Set up your furniture so you have a clear path. Avoid moving your furniture around. If any of your floors are uneven, fix them. If there are any pets around you, be aware of where they are. Review your medicines with your doctor. Some medicines can make you feel dizzy. This can increase your chance of falling. Ask your doctor what other things that you can do to help prevent falls. This information is not intended to replace advice given to you by your health care provider. Make sure you discuss any questions you have with your health care provider. Document Released: 10/31/2008 Document Revised: 06/12/2015 Document Reviewed: 02/08/2014 Elsevier Interactive Patient Education  2017 [employer].

## 2021-08-14 ENCOUNTER — Other Ambulatory Visit: Payer: Self-pay

## 2021-08-14 ENCOUNTER — Telehealth: Payer: Self-pay | Admitting: Pharmacist

## 2021-08-14 DIAGNOSIS — E2839 Other primary ovarian failure: Secondary | ICD-10-CM

## 2021-08-14 NOTE — Telephone Encounter (Signed)
Dexa scan order placed

## 2021-08-14 NOTE — Telephone Encounter (Signed)
-----   Message from Burnis Medin, MD sent at 08/09/2021  8:53 PM EDT ----- Ok  to have team order dexa scan . Up to patient if she wants to try ozempic   plan fu visit in 1 month to review med and dexa scan. WP ----- Message ----- From: Viona Gilmore, [facility] Sent: 08/05/2021   2:29 PM EDT To: Burnis Medin, MD  Hi,  Ms. Fiddler does want to try another diabetes medication instead of metformin. We discussed Ozempic at length, what do you think about prescribing this?   Also, can we do a repeat DEXA for her? Based on her FRAX score, she might benefit from treatment.  Let me know! Maddie

## 2021-08-14 NOTE — Telephone Encounter (Signed)
Routing to [facility] for DEXA order placement. Patient had previous DEXA at [facility].

## 2021-08-17 DIAGNOSIS — E1169 Type 2 diabetes mellitus with other specified complication: Secondary | ICD-10-CM

## 2021-08-17 DIAGNOSIS — I10 Essential (primary) hypertension: Secondary | ICD-10-CM | POA: Diagnosis not present

## 2021-08-17 DIAGNOSIS — E669 Obesity, unspecified: Secondary | ICD-10-CM

## 2021-08-26 DIAGNOSIS — M7072 Other bursitis of hip, left hip: Secondary | ICD-10-CM | POA: Diagnosis not present

## 2021-09-10 ENCOUNTER — Telehealth: Payer: Self-pay

## 2021-09-10 ENCOUNTER — Encounter: Payer: Self-pay | Admitting: Family Medicine

## 2021-09-10 ENCOUNTER — Ambulatory Visit (INDEPENDENT_AMBULATORY_CARE_PROVIDER_SITE_OTHER): Payer: Medicare Other | Admitting: Family Medicine

## 2021-09-10 VITALS — BP 116/60 | HR 65 | Temp 98.1°F | Wt 223.3 lb

## 2021-09-10 DIAGNOSIS — R300 Dysuria: Secondary | ICD-10-CM | POA: Diagnosis not present

## 2021-09-10 DIAGNOSIS — N3001 Acute cystitis with hematuria: Secondary | ICD-10-CM | POA: Diagnosis not present

## 2021-09-10 LAB — POCT URINALYSIS DIPSTICK
Bilirubin, UA: NEGATIVE
Glucose, UA: NEGATIVE
Ketones, UA: NEGATIVE
Nitrite, UA: NEGATIVE
Protein, UA: POSITIVE — AB
Spec Grav, UA: 1.025 (ref 1.010–1.025)
Urobilinogen, UA: NEGATIVE E.U./dL — AB
pH, UA: 6 (ref 5.0–8.0)

## 2021-09-10 MED ORDER — AMOXICILLIN 500 MG PO TABS: 500.0000 mg | ORAL_TABLET | Freq: Two times a day (BID) | ORAL | 0 refills | Status: AC

## 2021-09-10 NOTE — Telephone Encounter (Signed)
-  Caller states she thinks last night she has developed a UTI due to burning. Caller states she has been experiencing vaginal bleeding and passing blood clots this morning.  09/10/2021 9:41:10 AM SEE PCP WITHIN 3 DAYS Luan Pulling, RN, [place]  Referrals REFERRED TO PCP OFFICE  Pt has appt today.

## 2021-09-10 NOTE — Progress Notes (Signed)
Subjective:    Patient ID: Anna Gamble, female    DOB: 08-10-1935, 86 y.o.   MRN: [id number]  Chief Complaint  Patient presents with   Dysuria    Started late ysday afternoon, burning but only a dribble. Had some pink in the toilet this morning when went. Was passing some clots, fell about 2 weeks ago on her bottom. Went to ED for it.  Patient accompanied by her husband.  HPI Patient is an 86 year old female with pmh sig for HTN, HLD, severe aortic stenosis s/p TAVR R, CAD, GERD followed by Dr. Regis Bill and seen today for acute concern.  Pt with decreased urine output, frequency, dysuria, blood clots since last night.  Patient denies nausea, vomiting, back pain, suprapubic pain/pressure, fever, chills.  Patient drinking 3 glasses of water, coffee, and diet soda daily.  Pt seen by urology.  Past Medical History:  Diagnosis Date   CAD (coronary artery disease) 03/19/2014   Colon polyp    Coronary artery disease    GERD (gastroesophageal reflux disease)    Hx of colonoscopy with polypectomy 07/14/2012   medoff    Hyperlipidemia    Hypertension    Impaired fasting glucose    Morbid obesity (HCC)    S/P TAVR (transcatheter aortic valve replacement)    Severe aortic stenosis     Allergies  Allergen Reactions   Lisinopril Cough   Statins Other (See Comments)    muscle aches with some statins, tolerates low doses of crestor    ROS General: Denies fever, chills, night sweats, changes in weight, changes in appetite HEENT: Denies headaches, ear pain, changes in vision, rhinorrhea, sore throat CV: Denies CP, palpitations, SOB, orthopnea Pulm: Denies SOB, cough, wheezing GI: Denies abdominal pain, nausea, vomiting, diarrhea, constipation GU: Denies  vaginal discharge + dysuria, hematuria, frequency Msk: Denies muscle cramps, joint pains Neuro: Denies weakness, numbness, tingling Skin: Denies rashes, bruising Psych: Denies depression, anxiety, hallucinations      Objective:     Blood pressure 116/60, pulse 65, temperature 98.1 F (36.7 C), temperature source Oral, weight 223 lb 4.8 oz (101.3 kg), SpO2 95 %.  Gen. Pleasant, well-nourished, in no distress, normal affect   HEENT: [place]/AT, face symmetric, conjunctiva clear, no scleral icterus, PERRLA, EOMI, nares patent without drainage Lungs: no accessory muscle use Cardiovascular: RRR,no peripheral edema Abdomen: BS present, soft, NT/ND, no hepatosplenomegaly. Musculoskeletal: No deformities, no cyanosis or clubbing, normal tone Neuro:  A&Ox3, CN II-XII intact, sitting in transport wheelchair with cane Skin:  Warm, no lesions/ rash   Wt Readings from Last 3 Encounters:  08/11/21 220 lb (99.8 kg)  07/14/21 222 lb 12.8 oz (101.1 kg)  06/26/21 227 lb (103 kg)    Lab Results  Component Value Date   WBC 8.9 08/08/2021   HGB 12.6 08/08/2021   HCT 37.7 08/08/2021   PLT 192 08/08/2021   GLUCOSE 168 (H) 08/08/2021   CHOL 171 03/18/2021   TRIG 212 (H) 03/18/2021   HDL 61 03/18/2021   LDLDIRECT 171.0 05/27/2020   LDLCALC 75 03/18/2021   ALT 18 08/08/2021   AST 15 08/08/2021   NA 142 08/08/2021   K 3.9 08/08/2021   CL 102 08/08/2021   CREATININE 0.81 08/08/2021   BUN 14 08/08/2021   CO2 30 08/08/2021   TSH 0.95 04/13/2021   INR 1.0 08/08/2021   HGBA1C 6.9 (A) 07/14/2021   MICROALBUR 0.7 06/16/2021    Assessment/Plan:  Acute cystitis with hematuria - Plan: amoxicillin (AMOXIL) 500 MG tablet  Dysuria - Plan: POCT urinalysis dipstick, Urine Culture, amoxicillin (AMOXIL) 500 MG tablet  UA with 3+ RBCs, protein, leuks.  Start ABX for UTI while awaiting urine culture results.  No allergies antibiotics.  Also consider renal calculi.  Follow-up for continued or worsening symptoms.  Given strict precautions given blood clots.  Consider schedule follow-up with urology  F/u as needed  Grier Mitts, MD

## 2021-09-12 LAB — URINE CULTURE
MICRO NUMBER:: 13829770
SPECIMEN QUALITY:: ADEQUATE

## 2021-09-18 ENCOUNTER — Telehealth: Payer: Self-pay | Admitting: Pharmacist

## 2021-09-18 NOTE — Telephone Encounter (Signed)
Patient called as she is running low on metformin as she is now taking twice a day and doesn't have a new prescription to reflect this. She also inquired about the medication changes we discussed about trying Ozempic. Will discuss with PCP when she returns to follow up on med changes and refills.

## 2021-09-21 MED ORDER — OZEMPIC (0.25 OR 0.5 MG/DOSE) 2 MG/3ML ~~LOC~~ SOPN: 0.2500 mg | PEN_INJECTOR | SUBCUTANEOUS | 1 refills | Status: DC

## 2021-09-22 NOTE — Telephone Encounter (Signed)
Called patient back to let her know the prescription was sent in and set up an office visit to go through injection administration in 2 weeks per patient request. Will schedule PCP follow up 1 month after start.

## 2021-09-28 ENCOUNTER — Telehealth: Payer: Self-pay | Admitting: Internal Medicine

## 2021-09-28 DIAGNOSIS — M7072 Other bursitis of hip, left hip: Secondary | ICD-10-CM | POA: Diagnosis not present

## 2021-09-28 NOTE — Telephone Encounter (Signed)
Pt called to say Pharmacy told her the side affects of Ozempic. Pt states she no longer wants to take it. Please cancel this prescription.  Pt is also requesting a 90 day supply of  metFORMIN (GLUCOPHAGE) 500 MG tablet.  LOV:  09/10/21  Please send via: [facility] ([place] - [place], [place] Phone:  [phone]  Fax:  [phone]

## 2021-09-30 MED ORDER — METFORMIN HCL 500 MG PO TABS: 500.0000 mg | ORAL_TABLET | Freq: Two times a day (BID) | ORAL | 3 refills | Status: DC

## 2021-09-30 NOTE — Telephone Encounter (Signed)
If she doesn't want to take ozempic ok to dc   would place info in record about her choice

## 2021-10-05 ENCOUNTER — Telehealth: Payer: Medicare Other

## 2021-10-28 DIAGNOSIS — Z1231 Encounter for screening mammogram for malignant neoplasm of breast: Secondary | ICD-10-CM | POA: Diagnosis not present

## 2021-10-28 LAB — HM MAMMOGRAPHY

## 2021-10-28 NOTE — Telephone Encounter (Signed)
DC ozempic per patient.

## 2021-10-28 NOTE — Addendum Note (Signed)
Addended by: Encarnacion Slates on: 10/28/2021 11:22 AM   Modules accepted: Orders

## 2021-11-03 ENCOUNTER — Encounter: Payer: Self-pay | Admitting: Internal Medicine

## 2021-11-10 DIAGNOSIS — Z23 Encounter for immunization: Secondary | ICD-10-CM | POA: Diagnosis not present

## 2021-12-16 ENCOUNTER — Telehealth: Payer: Self-pay | Admitting: *Deleted

## 2021-12-16 NOTE — Telephone Encounter (Signed)
   Patient Name: Anna Gamble  DOB: Aug 01, 1935 MRN: [id number]  Primary Cardiologist: Kirk Ruths, MD  Chart reviewed as part of pre-operative protocol coverage.   Simple dental extractions (i.e. 1-2 teeth) or procedures are considered low risk procedures per guidelines and generally do not require any specific cardiac clearance. It is also generally accepted that for simple extractions and dental cleanings, there is no need to interrupt blood thinner therapy.  SBE prophylaxis is required for the patient from a cardiac standpoint.  I will route this recommendation to the requesting party via Epic fax function and remove from pre-op pool.  Please call with questions.  Loel Dubonnet, NP 12/16/2021, 12:09 PM

## 2021-12-16 NOTE — Telephone Encounter (Signed)
   Pre-operative Risk Assessment    Patient Name: Anna Gamble  DOB: 08/21/1935 MRN: [id number]     Request for Surgical Clearance    Procedure:   SINGLE DENTAL IMPLANT ; LOWER LEFT QUADRANT  Date of Surgery:  Clearance TBD                                 Surgeon:  DR. Madelin Rear, DDS Surgeon's Group or Practice Name:  [facility]  Phone number:  [phone] Fax number:  [phone]   Type of Clearance Requested:   - Medical ; ASA    Type of Anesthesia:  Local    Additional requests/questions:    Jiles Prows   12/16/2021, 11:39 AM

## 2021-12-24 DIAGNOSIS — R1310 Dysphagia, unspecified: Secondary | ICD-10-CM | POA: Diagnosis not present

## 2021-12-24 DIAGNOSIS — K2100 Gastro-esophageal reflux disease with esophagitis, without bleeding: Secondary | ICD-10-CM | POA: Diagnosis not present

## 2021-12-29 ENCOUNTER — Telehealth: Payer: Self-pay | Admitting: Pharmacist

## 2021-12-29 NOTE — Chronic Care Management (AMB) (Signed)
Chronic Care Management Pharmacy Assistant   Name: Anna Gamble  MRN: [id number] DOB: 1935/10/07   Reason for Encounter: Disease State General Assessment   Recent office visits:  09/10/21 Anna Ruddy, MD - Patient presented for Acute cystitis with hematuria and other concerns. Prescribed Amoxicillin.   08/11/21 Anna Simmering, LPN - Patient presented for Medicare Annual Wellness Exam. Patient voiced goal to start exercising. No medication changes.   Recent consult visits:  12/24/21 Anna Course Forcucci, PA-C  - Patient presented for Dysphagia and other concerns. No medication changes.   Hospital visits:  Medication Reconciliation was completed by comparing discharge summary, patient's EMR and Pharmacy list, and upon discussion with patient.  Patient presented to [facility] ED on 08/08/21 due to Fall. Patient was present for 5 hours.  New?Medications Started at [facility] Discharge:?? -started  none  Medication Changes at Hospital Discharge: -Changed  none  Medications Discontinued at Hospital Discharge: -Stopped  none  Medications that remain the same after Hospital Discharge:??  -All other medications will remain the same.    Medications: Outpatient Encounter Medications as of 12/29/2021  Medication Sig   aspirin 81 MG tablet Take 81 mg by mouth daily.   hydrochlorothiazide (MICROZIDE) 12.5 MG capsule TAKE 1 CAPSULE BY MOUTH DAILY   Lancets (ACCU-CHEK SOFT TOUCH) lancets Use as instructed   losartan (COZAAR) 25 MG tablet TAKE 2 TABLETS BY MOUTH DAILY   Menthol, Topical Analgesic, (BENGAY EX) Apply 1 application topically daily as needed (back pain).   metFORMIN (GLUCOPHAGE) 500 MG tablet Take 1 tablet (500 mg total) by mouth 2 (two) times daily with a meal.   metoprolol succinate (TOPROL-XL) 50 MG 24 hr tablet TAKE 1 TABLET BY MOUTH  DAILY   Misc Natural Products (TART CHERRY ADVANCED PO) Take 1 capsule by mouth daily.    Multiple  Vitamins-Minerals (PRESERVISION AREDS 2+MULTI VIT PO) Take by mouth.   Polyethyl Glycol-Propyl Glycol (SYSTANE) 0.4-0.3 % GEL ophthalmic gel Place 1 application into both eyes 2 (two) times daily.   rosuvastatin (CRESTOR) 20 MG tablet Take 1 tablet (20 mg total) by mouth daily.   Turmeric 500 MG CAPS Take 500 mg by mouth daily.   No facility-administered encounter medications on file as of 12/29/2021.   Contacted Anna Gamble for General Review Call  Adherence Review:  Does the Clinical Pharmacist Assistant have access to adherence rates? Yes Adherence rates for STAR metric medications  Losartan 25 mg - Last filled 11/26/21 90 DS at [facility] Losartan 25 mg - Last filled 09/02/21 90 DS at [facility] Metformin 500 mg - Last filled 10/14/21 90 DS at [facility] Metformin 500 mg - Last filled 07/21/21 90 DS at [facility] Rosuvastatin 20 mg - Last filled 06/11/21 90 DS at [facility] (Prescription Ended)  Does the patient have >5 day gap between last estimated fill dates for any of the above medications or other medication gaps? No    Disease State Questions:  Able to connect with Patient? Yes Did patient have any problems with their health recently? No  Have you had any admissions or emergency room visits or worsening of your condition(s) since last visit? No  Have you had any visits with new specialists or providers since your last visit? No  Have you had any new health care problem(s) since your last visit? No  Have you run out of any of your medications since you last spoke with clinical pharmacist? No  Are there any medications you are  not taking as prescribed? No  Are you having any issues or side effects with your medications? No  Do you have any other health concerns or questions you want to discuss with your Clinical Pharmacist before your next visit? No  Are there any health concerns that you feel we can do a better job addressing? No  Are you having any problems with any of the following  since the last visit: (select all that apply)  None  12. Any falls since last visit? Yes  Details: Patient reports she has had a couple of falls since she has been in the office but hasn't seriously hurt herself, She reports she is due to follow up with the NP in the office on next week. She further reports she has been having trouble avoiding sweets to help keep her sugars down but she knows that it is due to the holiday time of the year.   13. Any increased or uncontrolled pain since last visit? No  Care Gaps: Zoster Vaccine - Overdue TDAP - Overdue Flu Vaccine - Overdue COVID Booster - Overdue BP- 116/60 09/10/21 AWV- 08/11/21 Lab Results  Component Value Date   HGBA1C 6.9 (A) 07/14/2021    Star Rating Drugs: Losartan 25 mg - Last filled 11/26/21 90 DS at [facility] Metformin 500 mg - Last filled 10/14/21 90 DS at [facility] Pharmacist Assistant [phone]

## 2022-01-08 ENCOUNTER — Ambulatory Visit (INDEPENDENT_AMBULATORY_CARE_PROVIDER_SITE_OTHER): Payer: Medicare Other | Admitting: Family

## 2022-01-08 ENCOUNTER — Encounter: Payer: Self-pay | Admitting: Family

## 2022-01-08 VITALS — BP 130/70 | HR 60 | Temp 97.8°F | Wt 228.0 lb

## 2022-01-08 DIAGNOSIS — E1169 Type 2 diabetes mellitus with other specified complication: Secondary | ICD-10-CM | POA: Diagnosis not present

## 2022-01-08 DIAGNOSIS — E669 Obesity, unspecified: Secondary | ICD-10-CM

## 2022-01-08 DIAGNOSIS — I10 Essential (primary) hypertension: Secondary | ICD-10-CM

## 2022-01-08 DIAGNOSIS — K219 Gastro-esophageal reflux disease without esophagitis: Secondary | ICD-10-CM | POA: Diagnosis not present

## 2022-01-08 DIAGNOSIS — E782 Mixed hyperlipidemia: Secondary | ICD-10-CM | POA: Diagnosis not present

## 2022-01-08 LAB — CBC WITH DIFFERENTIAL/PLATELET
Basophils Absolute: 0 10*3/uL (ref 0.0–0.1)
Basophils Relative: 0.5 % (ref 0.0–3.0)
Eosinophils Absolute: 0.1 10*3/uL (ref 0.0–0.7)
Eosinophils Relative: 1.6 % (ref 0.0–5.0)
HCT: 39.6 % (ref 36.0–46.0)
Hemoglobin: 13.4 g/dL (ref 12.0–15.0)
Lymphocytes Relative: 39.2 % (ref 12.0–46.0)
Lymphs Abs: 2.7 10*3/uL (ref 0.7–4.0)
MCHC: 33.8 g/dL (ref 30.0–36.0)
MCV: 94.7 fl (ref 78.0–100.0)
Monocytes Absolute: 0.8 10*3/uL (ref 0.1–1.0)
Monocytes Relative: 11.2 % (ref 3.0–12.0)
Neutro Abs: 3.3 10*3/uL (ref 1.4–7.7)
Neutrophils Relative %: 47.5 % (ref 43.0–77.0)
Platelets: 264 10*3/uL (ref 150.0–400.0)
RBC: 4.19 Mil/uL (ref 3.87–5.11)
RDW: 13.7 % (ref 11.5–15.5)
WBC: 6.9 10*3/uL (ref 4.0–10.5)

## 2022-01-08 LAB — COMPREHENSIVE METABOLIC PANEL
ALT: 23 U/L (ref 0–35)
AST: 18 U/L (ref 0–37)
Albumin: 4 g/dL (ref 3.5–5.2)
Alkaline Phosphatase: 75 U/L (ref 39–117)
BUN: 14 mg/dL (ref 6–23)
CO2: 34 mEq/L — ABNORMAL HIGH (ref 19–32)
Calcium: 9.5 mg/dL (ref 8.4–10.5)
Chloride: 101 mEq/L (ref 96–112)
Creatinine, Ser: 1.01 mg/dL (ref 0.40–1.20)
GFR: 51.17 mL/min — ABNORMAL LOW (ref >60.00)
Glucose, Bld: 136 mg/dL — ABNORMAL HIGH (ref 70–99)
Potassium: 4.7 mEq/L (ref 3.5–5.1)
Sodium: 141 mEq/L (ref 135–145)
Total Bilirubin: 0.4 mg/dL (ref 0.2–1.2)
Total Protein: 6.8 g/dL (ref 6.0–8.3)

## 2022-01-08 LAB — LIPID PANEL
Cholesterol: 243 mg/dL — ABNORMAL HIGH (ref 0–200)
HDL: 54.4 mg/dL (ref >39.00)
NonHDL: 188.14
Total CHOL/HDL Ratio: 4
Triglycerides: 233 mg/dL — ABNORMAL HIGH (ref 0.0–149.0)
VLDL: 46.6 mg/dL — ABNORMAL HIGH (ref 0.0–40.0)

## 2022-01-08 LAB — POCT GLYCOSYLATED HEMOGLOBIN (HGB A1C): Hemoglobin A1C: 7.8 % — AB (ref 4.0–5.6)

## 2022-01-08 LAB — LDL CHOLESTEROL, DIRECT: Direct LDL: 168 mg/dL

## 2022-01-08 MED ORDER — METFORMIN HCL 500 MG PO TABS: 500.0000 mg | ORAL_TABLET | Freq: Two times a day (BID) | ORAL | 3 refills | Status: DC

## 2022-01-08 NOTE — Progress Notes (Signed)
   Established Patient Office Visit  Subjective   Patient ID: Anna Gamble, female    DOB: 08/19/1935  Age: 86 y.o. MRN: [id number]  Chief Complaint  Patient presents with   Follow-up    diabetes    HPI  {History (Optional):23778}  ROS    Objective:     BP 130/70 (BP Location: Left Arm, Patient Position: Sitting, Cuff Size: Large)   Pulse 60   Temp 97.8 F (36.6 C) (Oral)   Wt 228 lb (103.4 kg)   SpO2 96%   BMI 41.70 kg/m  {Vitals History (Optional):23777}  Physical Exam   Results for orders placed or performed in visit on 01/08/22  POCT HgB A1C  Result Value Ref Range   Hemoglobin A1C 7.8 (A) 4.0 - 5.6 %   HbA1c POC (<> result, manual entry)     HbA1c, POC (prediabetic range)     HbA1c, POC (controlled diabetic range)      {Labs (Optional):23779}  The ASCVD Risk score (Arnett DK, et al., 2019) failed to calculate for the following reasons:   The 2019 ASCVD risk score is only valid for ages 72 to 3    Assessment & Plan:   Problem List Items Addressed This Visit     HLD (hyperlipidemia)   Relevant Orders   CMP   Lipid panel   CBC w/Diff   GERD (gastroesophageal reflux disease)   Relevant Orders   CBC w/Diff   Essential hypertension   Relevant Orders   CMP   CBC w/Diff   Other Visit Diagnoses     Diabetes mellitus type 2 in obese ([place])    -  Primary   Relevant Medications   metFORMIN (GLUCOPHAGE) 500 MG tablet   Other Relevant Orders   POCT HgB A1C (Completed)   CMP   CBC w/Diff       Return in about 4 months (around 05/10/2022).    Kennyth Arnold, FNP

## 2022-01-12 ENCOUNTER — Other Ambulatory Visit: Payer: Self-pay | Admitting: Family

## 2022-01-12 MED ORDER — ROSUVASTATIN CALCIUM 5 MG PO TABS: 5.0000 mg | ORAL_TABLET | Freq: Every day | ORAL | 3 refills | Status: DC

## 2022-01-13 ENCOUNTER — Ambulatory Visit: Payer: Medicare Other | Admitting: Internal Medicine

## 2022-01-14 ENCOUNTER — Other Ambulatory Visit: Payer: Self-pay | Admitting: *Deleted

## 2022-01-14 MED ORDER — ROSUVASTATIN CALCIUM 5 MG PO TABS: 5.0000 mg | ORAL_TABLET | Freq: Every day | ORAL | 1 refills | Status: DC

## 2022-01-25 DIAGNOSIS — H35033 Hypertensive retinopathy, bilateral: Secondary | ICD-10-CM | POA: Diagnosis not present

## 2022-01-25 DIAGNOSIS — E119 Type 2 diabetes mellitus without complications: Secondary | ICD-10-CM | POA: Diagnosis not present

## 2022-01-25 LAB — HM DIABETES EYE EXAM

## 2022-01-27 ENCOUNTER — Telehealth: Payer: Self-pay | Admitting: Internal Medicine

## 2022-01-27 NOTE — Telephone Encounter (Signed)
Pt call and stated she need a RX for her test strips sent to  [facility], [place] - [address] AT [place] Phone: [phone]  Fax: [phone]

## 2022-01-27 NOTE — Telephone Encounter (Signed)
No prescription on file for test strips.   Pharmacy updated.

## 2022-01-28 ENCOUNTER — Other Ambulatory Visit: Payer: Self-pay

## 2022-01-28 DIAGNOSIS — E669 Obesity, unspecified: Secondary | ICD-10-CM

## 2022-01-28 MED ORDER — GLUCOSE BLOOD VI STRP: ORAL_STRIP | 1 refills | Status: DC

## 2022-01-28 NOTE — Telephone Encounter (Signed)
Spoke with patient she requested Accu Chefck Aviva test strips.    Rx sent to [facility] for generic Glucose blood test strips

## 2022-02-01 ENCOUNTER — Other Ambulatory Visit: Payer: Self-pay | Admitting: Family

## 2022-02-01 ENCOUNTER — Telehealth: Payer: Self-pay | Admitting: Internal Medicine

## 2022-02-01 DIAGNOSIS — E1169 Type 2 diabetes mellitus with other specified complication: Secondary | ICD-10-CM

## 2022-02-01 MED ORDER — GLUCOSE BLOOD VI STRP: ORAL_STRIP | 1 refills | Status: DC

## 2022-02-01 NOTE — Telephone Encounter (Signed)
  glucose blood test strip needs a new prescription with frequency and diagnosis code

## 2022-02-04 DIAGNOSIS — R1319 Other dysphagia: Secondary | ICD-10-CM | POA: Diagnosis not present

## 2022-02-04 DIAGNOSIS — K2100 Gastro-esophageal reflux disease with esophagitis, without bleeding: Secondary | ICD-10-CM | POA: Diagnosis not present

## 2022-03-03 ENCOUNTER — Other Ambulatory Visit: Payer: Self-pay | Admitting: Internal Medicine

## 2022-03-03 DIAGNOSIS — E1169 Type 2 diabetes mellitus with other specified complication: Secondary | ICD-10-CM

## 2022-03-22 NOTE — Progress Notes (Unsigned)
No chief complaint on file.   HPI: Anna Gamble 87 y.o. come in for Chronic disease management  Fu hyperglycemia  pre diabets dabetes  A1c was 7.8  in December .   ROS: See pertinent positives and negatives per HPI.  Past Medical History:  Diagnosis Date   CAD (coronary artery disease) 03/19/2014   Colon polyp    Coronary artery disease    GERD (gastroesophageal reflux disease)    Hx of colonoscopy with polypectomy 07/14/2012   medoff    Hyperlipidemia    Hypertension    Impaired fasting glucose    Morbid obesity (HCC)    S/P TAVR (transcatheter aortic valve replacement)    Severe aortic stenosis     Family History  Problem Relation Age of Onset   Coronary artery disease Other        female- 1st degree relative   Coronary artery disease Other        female- 1st degree relative   Hypertension Other    Hyperlipidemia Other    Heart attack Mother        age 35's   Heart attack Father        age 70's   Stroke Brother        died    Hearing loss Son    Sleep apnea Son    Aortic stenosis Sister    Stroke Brother    Heart attack Brother    Rheum arthritis Sister    Osteoarthritis Sister    Arthritis Sister    Heart Problems Brother     Social History   Socioeconomic History   Marital status: Married    Spouse name: Not on file   Number of children: 4   Years of education: Not on file   Highest education level: Not on file  Occupational History   Occupation: Retired    Comment: Takes care of [place]   Occupation: Therapist, sports, peds, cardiology    Comment: retired  Tobacco Use   Smoking status: Never   Smokeless tobacco: Never  Vaping Use   Vaping Use: Never used  Substance and Sexual Activity   Alcohol use: No   Drug use: No   Sexual activity: Not Currently    Comment: quit 40 yrs ago  Other Topics Concern   Not on file  Social History Narrative   hh of 3 -4  Married   [place] son   At home    Invalid son paraplegia and husband    And  grandson recently     No pets   Fluctuating hh membors she cooks and prepares for them          Social Determinants of Health   Financial Resource Strain: Low Risk  (08/11/2021)   Overall Financial Resource Strain (CARDIA)    Difficulty of Paying Living Expenses: Not hard at all  Food Insecurity: No [place] (08/11/2021)   Hunger Vital Sign    Worried About Running Out of Food in the Last Year: Never true    [place] in the Last Year: Never true  Transportation Needs: No Transportation Needs (08/11/2021)   PRAPARE - Hydrologist (Medical): No    Lack of Transportation (Non-Medical): No  Physical Activity: Inactive (08/11/2021)   Exercise Vital Sign    Days of Exercise per Week: 0 days    Minutes of Exercise per Session: 0 min  Stress: No Stress Concern Present (08/11/2021)  [place] Questionnaire    Feeling of Stress : Not at all  Social Connections: Socially Integrated (07/29/2020)   Social Connection and Isolation Panel [NHANES]    Frequency of Communication with Friends and Family: More than three times a week    Frequency of Social Gatherings with Friends and Family: Once a week    Attends Religious Services: 1 to 4 times per year    Active Member of [employer] or Organizations: Yes    Attends Archivist Meetings: 1 to 4 times per year    Marital Status: Married    Outpatient Medications Prior to Visit  Medication Sig Dispense Refill   aspirin 81 MG tablet Take 81 mg by mouth daily.     glucose blood test strip Twice a day and as needed 200 each 1   hydrochlorothiazide (MICROZIDE) 12.5 MG capsule TAKE 1 CAPSULE BY MOUTH DAILY 90 capsule 3   Lancets (ACCU-CHEK SOFT TOUCH) lancets Use as instructed 100 each 12   losartan (COZAAR) 25 MG tablet TAKE 2 TABLETS BY MOUTH DAILY 180 tablet 3   Menthol, Topical Analgesic, (BENGAY EX) Apply 1 application topically daily as needed (back  pain).     metFORMIN (GLUCOPHAGE) 500 MG tablet TAKE 1 TABLET BY MOUTH DAILY 90 tablet 3   metoprolol succinate (TOPROL-XL) 50 MG 24 hr tablet TAKE 1 TABLET BY MOUTH  DAILY 90 tablet 3   Misc Natural Products (TART CHERRY ADVANCED PO) Take 1 capsule by mouth daily.      Multiple Vitamins-Minerals (PRESERVISION AREDS 2+MULTI VIT PO) Take by mouth.     Polyethyl Glycol-Propyl Glycol (SYSTANE) 0.4-0.3 % GEL ophthalmic gel Place 1 application into both eyes 2 (two) times daily.     rosuvastatin (CRESTOR) 5 MG tablet Take 1 tablet (5 mg total) by mouth daily. 90 tablet 1   Turmeric 500 MG CAPS Take 500 mg by mouth daily.     No facility-administered medications prior to visit.     EXAM:  There were no vitals taken for this visit.  There is no height or weight on file to calculate BMI.  GENERAL: vitals reviewed and listed above, alert, oriented, appears well hydrated and in no acute distress HEENT: atraumatic, conjunctiva  clear, no obvious abnormalities on inspection of external nose and ears OP : no lesion edema or exudate  NECK: no obvious masses on inspection palpation  LUNGS: clear to auscultation bilaterally, no wheezes, rales or rhonchi, good air movement CV: HRRR, no clubbing cyanosis or  peripheral edema nl cap refill  MS: moves all extremities without noticeable focal  abnormality PSYCH: pleasant and cooperative, no obvious depression or anxiety Lab Results  Component Value Date   WBC 6.9 01/08/2022   HGB 13.4 01/08/2022   HCT 39.6 01/08/2022   PLT 264.0 01/08/2022   GLUCOSE 136 (H) 01/08/2022   CHOL 243 (H) 01/08/2022   TRIG 233.0 (H) 01/08/2022   HDL 54.40 01/08/2022   LDLDIRECT 168.0 01/08/2022   LDLCALC 75 03/18/2021   ALT 23 01/08/2022   AST 18 01/08/2022   NA 141 01/08/2022   K 4.7 01/08/2022   CL 101 01/08/2022   CREATININE 1.01 01/08/2022   BUN 14 01/08/2022   CO2 34 (H) 01/08/2022   TSH 0.95 04/13/2021   INR 1.0 08/08/2021   HGBA1C 7.8 (A) 01/08/2022    MICROALBUR 0.7 06/16/2021   BP Readings from Last 3 Encounters:  01/08/22 130/70  09/10/21 116/60  08/08/21 Marland Kitchen)  90/44    ASSESSMENT AND PLAN:  Discussed the following assessment and plan:  No diagnosis found.  -Patient advised to return or notify health care team  if  new concerns arise.  There are no Patient Instructions on file for this visit.   [name]. Samaj Wessells M.D.

## 2022-03-23 ENCOUNTER — Ambulatory Visit (INDEPENDENT_AMBULATORY_CARE_PROVIDER_SITE_OTHER): Payer: Medicare Other | Admitting: Internal Medicine

## 2022-03-23 ENCOUNTER — Other Ambulatory Visit: Payer: Self-pay | Admitting: Internal Medicine

## 2022-03-23 ENCOUNTER — Encounter: Payer: Self-pay | Admitting: Internal Medicine

## 2022-03-23 VITALS — BP 128/62 | HR 62 | Temp 97.6°F | Ht 62.0 in | Wt 229.8 lb

## 2022-03-23 DIAGNOSIS — Z952 Presence of prosthetic heart valve: Secondary | ICD-10-CM | POA: Diagnosis not present

## 2022-03-23 DIAGNOSIS — I2510 Atherosclerotic heart disease of native coronary artery without angina pectoris: Secondary | ICD-10-CM | POA: Diagnosis not present

## 2022-03-23 DIAGNOSIS — Z79899 Other long term (current) drug therapy: Secondary | ICD-10-CM | POA: Diagnosis not present

## 2022-03-23 DIAGNOSIS — E782 Mixed hyperlipidemia: Secondary | ICD-10-CM

## 2022-03-23 DIAGNOSIS — R202 Paresthesia of skin: Secondary | ICD-10-CM

## 2022-03-23 DIAGNOSIS — E669 Obesity, unspecified: Secondary | ICD-10-CM | POA: Diagnosis not present

## 2022-03-23 DIAGNOSIS — I350 Nonrheumatic aortic (valve) stenosis: Secondary | ICD-10-CM

## 2022-03-23 DIAGNOSIS — E1169 Type 2 diabetes mellitus with other specified complication: Secondary | ICD-10-CM | POA: Diagnosis not present

## 2022-03-23 DIAGNOSIS — G629 Polyneuropathy, unspecified: Secondary | ICD-10-CM | POA: Diagnosis not present

## 2022-03-23 DIAGNOSIS — R7309 Other abnormal glucose: Secondary | ICD-10-CM

## 2022-03-23 MED ORDER — RYBELSUS 3 MG PO TABS: 3.0000 mg | ORAL_TABLET | Freq: Every day | ORAL | 0 refills | Status: DC

## 2022-03-23 NOTE — Patient Instructions (Signed)
Continue  attention to diet.  Changes   Checking BG in am and record.  I think that  the  numbness could be from diabetes and we need to adjust to  to control  sugar levels.  Begin the crestor   can try 2  days per week if not usre of side effect possible.   Plan fasting labs  in 2 months and then  fu visit with results .

## 2022-04-05 ENCOUNTER — Telehealth: Payer: Self-pay

## 2022-04-05 NOTE — Progress Notes (Unsigned)
Patient ID: Anna Gamble, female   DOB: [birth year], 87 y.o.   MRN: [id number]  Care Management & Coordination Services Pharmacy Team  Reason for Encounter: Diabetes  Contacted patient to discuss diabetes disease state. {US HC Outreach:28874}  Current antihyperglycemic regimen:  Metformin 500 mg 1 tablet twice daily  Rybelsus 3 mg - Last filled  Levada Dy MAy   Patient verbally confirms she is taking the above medications as directed. {yes/no:20286}  What diet changes have been made to improve diabetes control?  What recent interventions/DTPs have been made to improve glycemic control:  ***  Have there been any recent hospitalizations or ED visits since last visit with PharmD? {yes/no:20286}  Patient {reports/denies:24182} hypoglycemic symptoms, including {Hypoglycemic Symptoms:3049003}  Patient {reports/denies:24182} hyperglycemic symptoms, including {symptoms; hyperglycemia:17903}  How often are you checking your blood sugar? {BG Testing frequency:23922}  What are your blood sugars ranging?  Fasting: *** Before meals: *** After meals: *** Bedtime: ***  During the week, how often does your blood glucose drop below 70? {LowBGfrequency:24142}  Are you checking your feet daily/regularly? {yes/no:20286}  Adherence Review: Is the patient currently on a STATIN medication? {yes/no:20286} Is the patient currently on ACE/ARB medication? {yes/no:20286} Does the patient have >5 day gap between last estimated fill dates? {yes/no:20286}   Chart Updates:  Recent office visits:  03/23/22 Burnis Medin, MD - Patent presented for Diabetes mellitus type 2 in obese and other concerns. Prescribed Ozempic.   01/08/22 Kennyth Arnold, FNP - Patient presented for Diabetes mellitus type 2 in obese and other concerns. No medication changes.   Recent consult visits:  02/04/22 [name], PA-C Gertie Fey) - Patient presented for GERD with esophagitis without hemorrhage.  Prescribed Omeprazole- Sodium bicarbonate.  Hospital visits:  Medication Reconciliation was completed by comparing discharge summary, patient's EMR and Pharmacy list, and upon discussion with patient.   Patient presented to [place] ED on 08/08/21 due to Fall. Patient was present for 5 hours.   New?Medications Started at [facility] Discharge:?? -started  none   Medication Changes at Hospital Discharge: -Changed  none   Medications Discontinued at Hospital Discharge: -Stopped  none   Medications that remain the same after Hospital Discharge:??  -All other medications will remain the same.    Medications: Outpatient Encounter Medications as of 04/05/2022  Medication Sig   aspirin 81 MG tablet Take 81 mg by mouth daily.   glucose blood test strip Twice a day and as needed   hydrochlorothiazide (MICROZIDE) 12.5 MG capsule TAKE 1 CAPSULE BY MOUTH DAILY   Lancets (ACCU-CHEK SOFT TOUCH) lancets Use as instructed   losartan (COZAAR) 25 MG tablet TAKE 2 TABLETS BY MOUTH DAILY   Menthol, Topical Analgesic, (BENGAY EX) Apply 1 application topically daily as needed (back pain). (Patient not taking: Reported on 03/23/2022)   metFORMIN (GLUCOPHAGE) 500 MG tablet TAKE 1 TABLET BY MOUTH DAILY   metoprolol succinate (TOPROL-XL) 50 MG 24 hr tablet TAKE 1 TABLET BY MOUTH  DAILY   Misc Natural Products (TART CHERRY ADVANCED PO) Take 1 capsule by mouth daily.    Multiple Vitamins-Minerals (PRESERVISION AREDS 2+MULTI VIT PO) Take by mouth.   Polyethyl Glycol-Propyl Glycol (SYSTANE) 0.4-0.3 % GEL ophthalmic gel Place 1 application into both eyes 2 (two) times daily.   rosuvastatin (CRESTOR) 5 MG tablet Take 1 tablet (5 mg total) by mouth daily.   Semaglutide (RYBELSUS) 3 MG TABS Take 1 tablet (3 mg total) by mouth daily.   Turmeric 500 MG CAPS Take 500  mg by mouth daily.   No facility-administered encounter medications on file as of 04/05/2022.    Recent Relevant Labs: Lab Results   Component Value Date/Time   HGBA1C 7.8 (A) 01/08/2022 10:44 AM   HGBA1C 6.9 (A) 07/14/2021 09:07 AM   HGBA1C 8.0 (H) 04/13/2021 10:34 AM   HGBA1C 7.3 (H) 05/27/2020 11:01 AM   MICROALBUR 0.7 06/16/2021 10:40 AM   MICROALBUR 0.9 05/27/2020 11:13 AM    Kidney Function Lab Results  Component Value Date/Time   CREATININE 1.01 01/08/2022 10:55 AM   CREATININE 0.81 08/08/2021 10:46 AM   CREATININE 0.86 10/08/2013 11:36 AM   GFR 51.17 (L) 01/08/2022 10:55 AM   GFRNONAA >60 08/08/2021 10:46 AM   GFRNONAA 66 10/08/2013 11:36 AM   GFRAA 74 04/19/2019 09:03 AM   GFRAA 76 10/08/2013 11:36 AM    Star Rating Drugs:  Losartan 25 mg - Last filled 02/19/22 90 DS at [facility] Metformin 500 mg - Last filled 03/16/22 90 DS at [facility] Rybelsus 3 mg - Last filled   Care Gaps: Zoster Vaccine - Overdue TDAP - Overdue COVID Booster - Overdue Diabetic Urine- Overdue AWV - 08/11/21      Ned Clines [place] Clinical Pharmacist Assistant [phone]

## 2022-04-15 ENCOUNTER — Other Ambulatory Visit: Payer: Self-pay | Admitting: Cardiology

## 2022-05-06 ENCOUNTER — Telehealth: Payer: Self-pay

## 2022-05-06 NOTE — Progress Notes (Signed)
Patient ID: Anna Gamble, female   DOB: [birth year], 87 y.o.   MRN: [id number]  Care Management & Coordination Services Pharmacy Team  Reason for Encounter: Appointment Reminder  Contacted patient to confirm telephone appointment with Milas Kocher, PharmD on 05/07/22 at 3. Spoke with patient on 05/06/2022     Star Rating Drugs:  Losartan 25 mg - Last filled 04/23/22 90 DS at [facility] Metformin 500 mg - Last filled 03/16/22 90 DS at [facility] Rosuvastatin 5 mg - Last filled 03/16/22 90 DS at [facility]  Rybelsus - Sampled    Care Gaps: Zoster Vaccine - Overdue TDAP - Overdue COVID Booster - Overdue AWV - 08/11/21   [name] CMA Clinical Pharmacist Assistant [phone]

## 2022-05-06 NOTE — Progress Notes (Signed)
Care Management & Coordination Services Pharmacy Note  05/07/2022 Name:  Anna Gamble MRN:  [id number] DOB:  05/31/1935  Summary: LDL not at goal <70 (skipping many crestor doses due to muscle/joint pains) A1C not at goal <7  Recommendations/Changes made from today's visit: -Counseled to switch crestor to bedtime dosing to help with side effects and improve efficacy -Counseled on cholesterol-lowering and low-carb diet -Removed rybelsus from med list as patient could not tolerate stomach cramping and constipation -Recommended farxiga 5mg , patient defers to try diet changes alone to see how sugars/A1C improve at 5/6 lab work, will discuss with PCP at appt in May  Follow up plan: DM/HLD in 2 months Pharmacist visit in 3-4 months   Subjective: Anna Gamble is an 87 y.o. year old female who is a primary patient of [name], Neta Mends, MD.  The care coordination team was consulted for assistance with disease management and care coordination needs.    Engaged with patient by telephone for follow up visit.  Recent office visits: 03/23/22 [name], MD - Patent presented for Diabetes mellitus type 2 in obese and other concerns. Prescribed Rybelsus.    01/08/22 Eulis Foster, FNP - Patient presented for Diabetes mellitus type 2 in obese and other concerns. No medication changes.  Recent consult visits: 02/04/22 Shirleen Schirmer Forcucci, PA-C Laurette Schimke) - Patient presented for GERD with esophagitis without hemorrhage. Prescribed Omeprazole- Sodium bicarbonate.    Hospital visits: None in previous 6 months   Objective:  Lab Results  Component Value Date   CREATININE 1.01 01/08/2022   BUN 14 01/08/2022   GFR 51.17 (L) 01/08/2022   GFRNONAA >60 08/08/2021   GFRAA 74 04/19/2019   NA 141 01/08/2022   K 4.7 01/08/2022   CALCIUM 9.5 01/08/2022   CO2 34 (H) 01/08/2022   GLUCOSE 136 (H) 01/08/2022    Lab Results  Component Value Date/Time   HGBA1C 7.8 (A) 01/08/2022  10:44 AM   HGBA1C 6.9 (A) 07/14/2021 09:07 AM   HGBA1C 8.0 (H) 04/13/2021 10:34 AM   HGBA1C 7.3 (H) 05/27/2020 11:01 AM   GFR 51.17 (L) 01/08/2022 10:55 AM   GFR 62.38 04/13/2021 10:34 AM   MICROALBUR 0.7 06/16/2021 10:40 AM   MICROALBUR 0.9 05/27/2020 11:13 AM    Last diabetic Eye exam:  Lab Results  Component Value Date/Time   HMDIABEYEEXA No Retinopathy 01/25/2022 08:35 AM    Last diabetic Foot exam: No results found for: "HMDIABFOOTEX"   Lab Results  Component Value Date   CHOL 243 (H) 01/08/2022   HDL 54.40 01/08/2022   LDLCALC 75 03/18/2021   LDLDIRECT 168.0 01/08/2022   TRIG 233.0 (H) 01/08/2022   CHOLHDL 4 01/08/2022       Latest Ref Rng & Units 01/08/2022   10:55 AM 08/08/2021   10:46 AM 04/13/2021   10:34 AM  Hepatic Function  Total Protein 6.0 - 8.3 g/dL 6.8  6.7  7.1   Albumin 3.5 - 5.2 g/dL 4.0  4.0  4.2   AST 0 - 37 U/L 18  15  22    ALT 0 - 35 U/L 23  18  25    Alk Phosphatase 39 - 117 U/L 75  60  56   Total Bilirubin 0.2 - 1.2 mg/dL 0.4  0.6  0.7   Bilirubin, Direct 0.0 - 0.3 mg/dL   0.1     Lab Results  Component Value Date/Time   TSH 0.95 04/13/2021 10:34 AM   TSH 1.05 04/16/2015 10:43 AM  Latest Ref Rng & Units 01/08/2022   10:55 AM 08/08/2021   10:46 AM 04/13/2021   10:34 AM  CBC  WBC 4.0 - 10.5 K/uL 6.9  8.9  7.2   Hemoglobin 12.0 - 15.0 g/dL 16.1  09.6  04.5   Hematocrit 36.0 - 46.0 % 39.6  37.7  39.8   Platelets 150.0 - 400.0 K/uL 264.0  192  219.0     Lab Results  Component Value Date/Time   VD25OH 39 03/26/2008 10:01 PM   VITAMINB12 334 12/31/2009 11:14 AM    Clinical ASCVD: No  The ASCVD Risk score (Arnett DK, et al., 2019) failed to calculate for the following reasons:   The 2019 ASCVD risk score is only valid for ages 56 to 38        03/23/2022   11:30 AM 01/08/2022   10:41 AM 09/10/2021    1:15 PM  Depression screen PHQ 2/9  Decreased Interest 0 0 0  Down, Depressed, Hopeless 0 0 0  PHQ - 2 Score 0 0 0  Altered  sleeping 1 1 1   Tired, decreased energy 1 0 1  Change in appetite 0 0 0  Feeling bad or failure about yourself  0 0 0  Trouble concentrating 0 0 0  Moving slowly or fidgety/restless 0 0 0  Suicidal thoughts 0 0 0  PHQ-9 Score 2 1 2   Difficult doing work/chores Not difficult at all Not difficult at all Not difficult at all     Social History   Tobacco Use  Smoking Status Never  Smokeless Tobacco Never   BP Readings from Last 3 Encounters:  03/23/22 128/62  01/08/22 130/70  09/10/21 116/60   Pulse Readings from Last 3 Encounters:  03/23/22 62  01/08/22 60  09/10/21 65   Wt Readings from Last 3 Encounters:  03/23/22 229 lb 12.8 oz (104.2 kg)  01/08/22 228 lb (103.4 kg)  09/10/21 223 lb 4.8 oz (101.3 kg)   BMI Readings from Last 3 Encounters:  03/23/22 42.03 kg/m  01/08/22 41.70 kg/m  09/10/21 40.84 kg/m    Allergies  Allergen Reactions   Lisinopril Cough   Statins Other (See Comments)    muscle aches with some statins, tolerates low doses of crestor    Medications Reviewed Today     Reviewed by Sherrill Raring, RPH (Pharmacist) on 05/07/22 at 1420  Med List Status: <None>   Medication Order Taking? Sig Documenting Provider Last Dose Status Informant  aspirin 81 MG tablet [id number]  Take 81 mg by mouth daily. [provider]  Active Self  glucose blood test strip [id number]  Twice a day and as needed Eulis Foster, FNP  Active   hydrochlorothiazide (MICROZIDE) 12.5 MG capsule [id number]  TAKE 1 CAPSULE BY MOUTH DAILY Jens Som Madolyn Frieze, MD  Active   Lancets (ACCU-CHEK SOFT TOUCH) lancets [id number]  Use as instructed [name], Neta Mends, MD  Active   losartan (COZAAR) 25 MG tablet [id number]  TAKE 2 TABLETS BY MOUTH DAILY Jens Som Madolyn Frieze, MD  Active   Menthol, Topical Analgesic, [facility] EX) [id number]  Apply 1 application topically daily as needed (back pain).  Patient not taking: Reported on 03/23/2022   [provider]  Active Self  metFORMIN  (GLUCOPHAGE) 500 MG tablet [id number]  TAKE 1 TABLET BY MOUTH DAILY [name], Neta Mends, MD  Active   metoprolol succinate (TOPROL-XL) 50 MG 24 hr tablet [id number]  TAKE 1 TABLET BY MOUTH  DAILY Crenshaw, Madolyn Frieze, MD  Active   Misc Natural Products (TART CHERRY ADVANCED PO) [id number]  Take 1 capsule by mouth daily.  [provider]  Active Self           Med Note Aurther Loft, Kenn File Jun 14, 2018  1:46 PM)    Multiple Vitamins-Minerals (PRESERVISION AREDS 2+MULTI VIT PO) [id number]  Take by mouth. [provider]  Active   Polyethyl Glycol-Propyl Glycol (SYSTANE) 0.4-0.3 % GEL ophthalmic gel [id number]  Place 1 application into both eyes 2 (two) times daily. [provider]  Active Self  rosuvastatin (CRESTOR) 5 MG tablet [id number]  Take 1 tablet (5 mg total) by mouth daily. Worthy Rancher B, FNP  Active   Semaglutide (RYBELSUS) 3 MG TABS [id number] No Take 1 tablet (3 mg total) by mouth daily.  Patient not taking: Reported on 05/07/2022   [name], MD Not Taking Active   Turmeric 500 MG CAPS [id number]  Take 500 mg by mouth daily. [provider]  Active             SDOH:  (Social Determinants of Health) assessments and interventions performed: Yes SDOH Interventions    Flowsheet Row Care Coordination from 05/07/2022 in [facility] Chronic Care Management from 08/05/2021 in [facility] [place] Clinical Support from 07/24/2019 in [facility] [place] Clinical Support from 03/10/2018 in [facility] [place]  SDOH Interventions      Food Insecurity Interventions Intervention Not Indicated -- -- --  Housing Interventions Intervention Not Indicated -- -- --  Depression Interventions/Treatment  -- -- -- PHQ2-9 Score <4 Follow-up Not Indicated  Financial Strain Interventions -- Intervention Not Indicated -- --  Physical Activity Interventions -- -- Intervention Not Indicated --        Medication Assistance: None required.  Patient affirms current coverage meets needs.  Medication Access: Within the past 30 days, how often has patient missed a dose of medication? Intentionally skipping rosuvastatin most days of the week due to SE Is a pillbox or other method used to improve adherence? Yes  Factors that may affect medication adherence? adverse effects of medications Are meds synced by current pharmacy? No  Are meds delivered by current pharmacy? Yes  Does patient experience delays in picking up medications due to transportation concerns? No   [facility] Reviewed: Is patient disadvantaged to use [facility]?: Yes  Current Rx insurance plan: [employer] Name and location of Current pharmacy:  [facility] [place], [place] - [address] [place] [address] Phone: [phone] Fax: [phone]  [id number] [place], [place] - [address] [place] [address] Phone: [phone] Fax: [phone]  [facility] services reviewed with patient today?: No  Patient requests to transfer care to [facility]?: No  Reason patient declined to change pharmacies: Disadvantaged due to insurance/mail order  Compliance/Adherence/Medication fill history: Care Gaps: Zoster Vaccine - Overdue TDAP - Overdue COVID Booster - Overdue AWV - 08/11/21  Star-Rating Drugs: Rosuvastatin  PDC 63% Metformin  100% Losartan  PDC 100%   Assessment/Plan   Hyperlipidemia: (LDL goal < 70) -Uncontrolled -Current treatment: Rosuvastatin  1 qd Appropriate, Query Effective -Medications previously tried: None  -Current dietary patterns: see DM section -Current exercise habits: see DM section -Patient admits to skipping doses due to pains in muscles/joints when taking -Educated on Cholesterol goals;  Benefits of statin  for ASCVD risk  reduction; Importance of limiting foods high in cholesterol; Exercise goal of 150 minutes per week; Strategies to manage statin-induced myalgias; -Recommended to continue current medication but switch to taking at bedtime to help with aches/pains and to improve efficacy  Diabetes (A1c goal <7%) -Uncontrolled -Current medications: Rybelsus  1 qd - stopped taking due to gastro issues (cramping, constipation) Metformin  1 qd Appropriate, Query Effective -Medications previously tried: None  -Current home glucose readings fasting glucose: 160 today, 160 yesterday, 151 (177 in march is highest) -Denies hypoglycemic/hyperglycemic symptoms -Current meal patterns:  breakfast: sausage biscuit or bagel, french toast with banana and sugar free syrup, grits and an egg  lunch: tossed salad with bell pepper, chicken, onion, buttermilk dressing  dinner: chicken, slaw and potato last night, corn snacks: popcorn, 1/2 cup of ice cream occasionally, fruit drinks: water, coffee and cream with artificial sweetener (stevia), diet soda -Current exercise: limited due to arthritis, does do some gardening -Could not tolerate metformin dose increase (diarrhea) -Educated on A1c and blood sugar goals; Complications of diabetes including kidney damage, retinal damage, and cardiovascular disease; Exercise goal of 150 minutes per week; Benefits of routine self-monitoring of blood sugar; Carbohydrate counting and/or plate method -Counseled to check feet daily and get yearly eye exams -Counseled on diet and exercise extensively Recommended farxiga  but patient defers to try diet changes alone first, will consider starting if A1C/sugars still elevated at check in 3 weeks  Sherrill Raring Clinical Pharmacist [phone]

## 2022-05-07 ENCOUNTER — Ambulatory Visit: Payer: Medicare Other

## 2022-05-16 NOTE — Progress Notes (Deleted)
Cardiology Clinic Note   Date: 05/16/2022 ID: Anna Gamble 12/30/1935, MRN [id number]  Primary Cardiologist:  Olga Millers, MD  Patient Profile    Anna Gamble is a 87 y.o. female who presents to the clinic today for ***  Past medical history significant for: Nonobstructive CAD. LHC 10/10/2013 (abnormal stress test): Mild nonobstructive disease.  R/LHC 04/06/2018 (severe aortic stenosis): Ostial to proximal RCA 40%.  Ostial to mid LM 20%. Aortic valve stenosis. Echo 03/24/2018: Hyperdynamic LV function, EF > 65%.  Grade I DD.  Peak and mean gradient 68 and 43 mmHg respectively consistent with severe aortic stenosis.  Gradients increased from October 2019. R/LHC 04/06/2018: Severe aortic stenosis, mean gradient 28.7 mmHg, peak to peak gradient 39 mmHg. TAVR 06/06/2018. Echo 01/13/2021: EF 60 to 65%.  Mild LVH.  Grade I DD.  Trivial MR.  Normal structure and function of the aortic valve prosthesis.  Mean gradient 14 mmHg. Palpitations. 10-day ZIO 07/25/2018: Sinus rhythm with occasional PAC, PVC, couplet and brief PAT. Carotid artery stenosis. Carotid ultrasound 05/31/2018: 1 to 39% bilateral ICA. Hypertension. Hyperlipidemia. Lipid panel 01/08/2022: Direct LDL 168, HDL 54, TG 233, total 243. GERD.   History of Present Illness    Anna Gamble was first evaluated by Dr. Jens Som on 06/18/2008 for shortness of breath with known history of aortic stenosis.  Patient was last seen in the cardiology office in April 2005 when she had a normal Myoview.  Patient underwent an echo to evaluate cardiac murmur on 04/25/2008 whichshowed mild aortic stenosis with a mean gradient of 11 mmHg.  Patient continues to be followed by Dr. Royann Shivers for the above outlined history.  Patient was last seen in the office by Dr. Jens Som on 06/26/2021 for routine follow-up.  She was doing well at that time and no changes were made.  Today, patient ***  Nonobstructive CAD.  Nonobstructive CAD  per angiography March 2020.  Patient*** Continue aspirin, metoprolol, rosuvastatin. Aortic valve stenosis s/p TAVR May 2020.  Echo December 2022 showed normal LV function with normal structure and function of aortic valve prosthesis, mean gradient 14 mmHg.  Patient denies*** Continue SBE prophylaxis. Carotid artery stenosis.  Carotid ultrasound May 2020 showed 1-39% bilateral ICA.  Patient denies lightheadedness, dizziness, presyncope, syncope.  Will repeat ultrasound as clinically indicated.  Continue rosuvastatin and aspirin. Hypertension. BP today *** Patient denies headaches, dizziness or vision changes. Continue losartan and metoprolol. Hyperlipidemia.  LDL December 2023 168, not at goal.  Continue rosuvastatin.***   ROS: All other systems reviewed and are otherwise negative except as noted in History of Present Illness.  Studies Reviewed    ECG personally reviewed by me today: ***  No significant changes from ***  Risk Assessment/Calculations    {Does this patient have ATRIAL FIBRILLATION?:[phone]} No BP recorded.  {Refresh Note OR Click here to enter BP  :1}***        Physical Exam    VS:  There were no vitals taken for this visit. , BMI There is no height or weight on file to calculate BMI.  GEN: Well nourished, well developed, in no acute distress. Neck: No JVD or carotid bruits. Cardiac: *** RRR. No murmurs. No rubs or gallops.   Respiratory:  Respirations regular and unlabored. Clear to auscultation without rales, wheezing or rhonchi. GI: Soft, nontender, nondistended. Extremities: Radials/DP/PT 2+ and equal bilaterally. No clubbing or cyanosis. No edema ***  Skin: Warm and dry, no rash. Neuro: Strength intact.  Assessment & Plan   ***  Disposition: ***     {Are you ordering a CV Procedure (e.g. stress test, cath, DCCV, TEE, etc)?   Press F2        :[id number]   Signed, Justice Britain. March Joos, DNP, NP-C

## 2022-05-18 ENCOUNTER — Ambulatory Visit: Payer: Medicare Other | Admitting: Student

## 2022-05-24 ENCOUNTER — Other Ambulatory Visit (INDEPENDENT_AMBULATORY_CARE_PROVIDER_SITE_OTHER): Payer: Medicare Other

## 2022-05-24 DIAGNOSIS — E669 Obesity, unspecified: Secondary | ICD-10-CM

## 2022-05-24 DIAGNOSIS — E1169 Type 2 diabetes mellitus with other specified complication: Secondary | ICD-10-CM

## 2022-05-24 DIAGNOSIS — Z79899 Other long term (current) drug therapy: Secondary | ICD-10-CM | POA: Diagnosis not present

## 2022-05-24 DIAGNOSIS — R200 Anesthesia of skin: Secondary | ICD-10-CM

## 2022-05-24 DIAGNOSIS — E782 Mixed hyperlipidemia: Secondary | ICD-10-CM | POA: Diagnosis not present

## 2022-05-24 DIAGNOSIS — R202 Paresthesia of skin: Secondary | ICD-10-CM | POA: Diagnosis not present

## 2022-05-24 LAB — LIPID PANEL
Cholesterol: 211 mg/dL — ABNORMAL HIGH (ref 0–200)
HDL: 47.6 mg/dL (ref >39.00)
NonHDL: 163.33
Total CHOL/HDL Ratio: 4
Triglycerides: 226 mg/dL — ABNORMAL HIGH (ref 0.0–149.0)
VLDL: 45.2 mg/dL — ABNORMAL HIGH (ref 0.0–40.0)

## 2022-05-24 LAB — VITAMIN B12: Vitamin B-12: 182 pg/mL — ABNORMAL LOW (ref 211–911)

## 2022-05-24 LAB — HEMOGLOBIN A1C: Hgb A1c MFr Bld: 8 % — ABNORMAL HIGH (ref 4.6–6.5)

## 2022-05-24 LAB — LDL CHOLESTEROL, DIRECT: Direct LDL: 146 mg/dL

## 2022-05-24 LAB — MICROALBUMIN / CREATININE URINE RATIO
Creatinine,U: 158.8 mg/dL
Microalb Creat Ratio: 0.4 mg/g (ref 0.0–30.0)
Microalb, Ur: <0.7 mg/dL (ref 0.0–1.9)

## 2022-05-31 NOTE — Progress Notes (Unsigned)
No chief complaint on file.   HPI: Anna Gamble 87 y.o. come in for Chronic disease management  Med check  rybelsus  ROS: See pertinent positives and negatives per HPI.  Past Medical History:  Diagnosis Date   CAD (coronary artery disease) 03/19/2014   Colon polyp    Coronary artery disease    GERD (gastroesophageal reflux disease)    Hx of colonoscopy with polypectomy 07/14/2012   medoff    Hyperlipidemia    Hypertension    Impaired fasting glucose    Morbid obesity (HCC)    S/P TAVR (transcatheter aortic valve replacement)    Severe aortic stenosis     Family History  Problem Relation Age of Onset   Coronary artery disease Other        female- 1st degree relative   Coronary artery disease Other        female- 1st degree relative   Hypertension Other    Hyperlipidemia Other    Heart attack Mother        age 13's   Heart attack Father        age 79's   Stroke Brother        died    Hearing loss Son    Sleep apnea Son    Aortic stenosis Sister    Stroke Brother    Heart attack Brother    Rheum arthritis Sister    Osteoarthritis Sister    Arthritis Sister    Heart Problems Brother     Social History   Socioeconomic History   Marital status: Married    Spouse name: Not on file   Number of children: 4   Years of education: Not on file   Highest education level: Not on file  Occupational History   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   Occupation: Charity fundraiser, peds, cardiology    Comment: retired  Tobacco Use   Smoking status: Never   Smokeless tobacco: Never  Vaping Use   Vaping Use: Never used  Substance and Sexual Activity   Alcohol use: No   Drug use: No   Sexual activity: Not Currently    Comment: quit 40 yrs ago  Other Topics Concern   Not on file  Social History Narrative   hh of 3 -4  Married   Grand son   At home    Invalid son paraplegia and husband    And grandson recently     No pets   Fluctuating hh membors  she cooks and prepares for them          Social Determinants of Health   Financial Resource Strain: Low Risk  (08/11/2021)   Overall Financial Resource Strain (CARDIA)    Difficulty of Paying Living Expenses: Not hard at all  Food Insecurity: No Food Insecurity (05/07/2022)   Hunger Vital Sign    Worried About Running Out of Food in the Last Year: Never true    Ran Out of Food in the Last Year: Never true  Transportation Needs: No Transportation Needs (08/11/2021)   PRAPARE - Administrator, Civil Service (Medical): No    Lack of Transportation (Non-Medical): No  Physical Activity: Inactive (08/11/2021)   Exercise Vital Sign    Days of Exercise per Week: 0 days    Minutes of Exercise per Session: 0 min  Stress: No Stress Concern Present (08/11/2021)   [employer] of Occupational Health - Occupational Stress Questionnaire  Feeling of Stress : Not at all  Social Connections: Socially Integrated (07/29/2020)   Social Connection and Isolation Panel [NHANES]    Frequency of Communication with Friends and Family: More than three times a week    Frequency of Social Gatherings with Friends and Family: Once a week    Attends Religious Services: 1 to 4 times per year    Active Member of [employer] or Organizations: Yes    Attends Banker Meetings: 1 to 4 times per year    Marital Status: Married    Outpatient Medications Prior to Visit  Medication Sig Dispense Refill   aspirin 81 MG tablet Take 81 mg by mouth daily.     glucose blood test strip Twice a day and as needed 200 each 1   hydrochlorothiazide (MICROZIDE) 12.5 MG capsule TAKE 1 CAPSULE BY MOUTH DAILY 90 capsule 3   Lancets (ACCU-CHEK SOFT TOUCH) lancets Use as instructed 100 each 12   losartan (COZAAR) 25 MG tablet TAKE 2 TABLETS BY MOUTH DAILY 180 tablet 3   Menthol, Topical Analgesic, (BENGAY EX) Apply 1 application topically daily as needed (back pain). (Patient not taking: Reported on 03/23/2022)      metFORMIN (GLUCOPHAGE) 500 MG tablet TAKE 1 TABLET BY MOUTH DAILY 90 tablet 3   metoprolol succinate (TOPROL-XL) 50 MG 24 hr tablet TAKE 1 TABLET BY MOUTH  DAILY 90 tablet 3   Misc Natural Products (TART CHERRY ADVANCED PO) Take 1 capsule by mouth daily.      Multiple Vitamins-Minerals (PRESERVISION AREDS 2+MULTI VIT PO) Take by mouth.     Polyethyl Glycol-Propyl Glycol (SYSTANE) 0.4-0.3 % GEL ophthalmic gel Place 1 application into both eyes 2 (two) times daily.     rosuvastatin (CRESTOR) 5 MG tablet Take 1 tablet (5 mg total) by mouth daily. 90 tablet 1   Semaglutide (RYBELSUS) 3 MG TABS Take 1 tablet (3 mg total) by mouth daily. (Patient not taking: Reported on 05/07/2022) 30 tablet 0   Turmeric 500 MG CAPS Take 500 mg by mouth daily.     No facility-administered medications prior to visit.     EXAM:  There were no vitals taken for this visit.  There is no height or weight on file to calculate BMI.  GENERAL: vitals reviewed and listed above, alert, oriented, appears well hydrated and in no acute distress HEENT: atraumatic, conjunctiva  clear, no obvious abnormalities on inspection of external nose and ears OP : no lesion edema or exudate  NECK: no obvious masses on inspection palpation  LUNGS: clear to auscultation bilaterally, no wheezes, rales or rhonchi, good air movement CV: HRRR, no clubbing cyanosis or  peripheral edema nl cap refill  MS: moves all extremities without noticeable focal  abnormality PSYCH: pleasant and cooperative, no obvious depression or anxiety Lab Results  Component Value Date   WBC 6.9 01/08/2022   HGB 13.4 01/08/2022   HCT 39.6 01/08/2022   PLT 264.0 01/08/2022   GLUCOSE 136 (H) 01/08/2022   CHOL 211 (H) 05/24/2022   TRIG 226.0 (H) 05/24/2022   HDL 47.60 05/24/2022   LDLDIRECT 146.0 05/24/2022   LDLCALC 75 03/18/2021   ALT 23 01/08/2022   AST 18 01/08/2022   NA 141 01/08/2022   K 4.7 01/08/2022   CL 101 01/08/2022   CREATININE 1.01 01/08/2022    BUN 14 01/08/2022   CO2 34 (H) 01/08/2022   TSH 0.95 04/13/2021   INR 1.0 08/08/2021   HGBA1C 8.0 (H) 05/24/2022   MICROALBUR <  0.7 05/24/2022   BP Readings from Last 3 Encounters:  03/23/22 128/62  01/08/22 130/70  09/10/21 116/60    ASSESSMENT AND PLAN:  Discussed the following assessment and plan:  Morbid obesity (HCC)  Mixed hyperlipidemia  Essential hypertension  Coronary artery disease involving native coronary artery of native heart without angina pectoris  S/P TAVR (transcatheter aortic valve replacement)  Medication management  Type 2 diabetes mellitus with hyperglycemia, without long-term current use of insulin (HCC)  -Patient advised to return or notify health care team  if  new concerns arise.  There are no Patient Instructions on file for this visit.   Neta Mends. Amr Sturtevant M.D.

## 2022-06-01 ENCOUNTER — Ambulatory Visit (INDEPENDENT_AMBULATORY_CARE_PROVIDER_SITE_OTHER): Payer: Medicare Other | Admitting: Internal Medicine

## 2022-06-01 ENCOUNTER — Encounter: Payer: Self-pay | Admitting: Internal Medicine

## 2022-06-01 VITALS — BP 120/70 | HR 63 | Temp 97.8°F | Ht 62.0 in | Wt 226.4 lb

## 2022-06-01 DIAGNOSIS — T887XXA Unspecified adverse effect of drug or medicament, initial encounter: Secondary | ICD-10-CM | POA: Diagnosis not present

## 2022-06-01 DIAGNOSIS — I10 Essential (primary) hypertension: Secondary | ICD-10-CM

## 2022-06-01 DIAGNOSIS — Z7984 Long term (current) use of oral hypoglycemic drugs: Secondary | ICD-10-CM

## 2022-06-01 DIAGNOSIS — E782 Mixed hyperlipidemia: Secondary | ICD-10-CM

## 2022-06-01 DIAGNOSIS — E1165 Type 2 diabetes mellitus with hyperglycemia: Secondary | ICD-10-CM | POA: Insufficient documentation

## 2022-06-01 DIAGNOSIS — Z6841 Body Mass Index (BMI) 40.0 and over, adult: Secondary | ICD-10-CM

## 2022-06-01 DIAGNOSIS — I2510 Atherosclerotic heart disease of native coronary artery without angina pectoris: Secondary | ICD-10-CM | POA: Diagnosis not present

## 2022-06-01 DIAGNOSIS — Z79899 Other long term (current) drug therapy: Secondary | ICD-10-CM

## 2022-06-01 DIAGNOSIS — Z952 Presence of prosthetic heart valve: Secondary | ICD-10-CM | POA: Diagnosis not present

## 2022-06-01 MED ORDER — DAPAGLIFLOZIN PROPANEDIOL 5 MG PO TABS: 5.0000 mg | ORAL_TABLET | Freq: Every day | ORAL | 0 refills | Status: DC

## 2022-06-01 NOTE — Patient Instructions (Signed)
Stop the rybelsus .Cause of side effects . Begin farxiga 5 mg per day ( 1/2 of 10 mg per day)   As discussed  Plan ROV in a month or thereabouts .

## 2022-06-01 NOTE — Assessment & Plan Note (Signed)
Had epgastric pain with  3 mg rybelsis  st stopped  samples of farxiga 5 mg with warning precution of  stopping  for illness of dehydration and check bg

## 2022-06-11 ENCOUNTER — Other Ambulatory Visit: Payer: Self-pay | Admitting: Cardiology

## 2022-06-24 ENCOUNTER — Telehealth: Payer: Self-pay

## 2022-06-24 NOTE — Progress Notes (Signed)
Patient ID: Anna Gamble, female   DOB: 1935-10-11, 87 y.o.   MRN: [id number]  Care Management & Coordination Services Pharmacy Team  Reason for Encounter: Diabetes  Contacted patient to discuss diabetes disease state. Spoke with patient on 06/24/2022   Current antihyperglycemic regimen:  Farxiga 5 mg (Sampled)  Metformin 500mg  1 qd   Patient verbally confirms she is taking the above medications as directed. No  What recent interventions/DTPs have been made to improve glycemic control:  Patient reports she was recently sampled on 10 mg of Farxiga and had been taking half a tab.  Have there been any recent hospitalizations or ED visits since last visit with PharmD? No  Patient denies hypoglycemic symptoms, including None  Patient denies hyperglycemic symptoms, including none  How often are you checking your blood sugar? in the morning before eating or drinking  What are your blood sugars ranging?  Patient reports she has not been tolerating the [place] well, she states she stopped taking it last week, she states she is UTI prone and had been feeling like the medication was making her symptomatic so she stopped it as of last week and has only been taking her Marcelline Deist, she states on the medication her sugars had been in the 160's and on this morning was 145, she does admit that she enjoys eating breads and carbohydrates and is trying to limit eating them as much. Per AH offered pt follow up in June due to only taking metformin at present, patient aware and in agreement  During the week, how often does your blood glucose drop below 70? Never    Current antihyperlipidemic regimen:  Rosuvastatin 5mg  1 qd  Previous antihyperlipidemic medications tried: n/a ASCVD risk enhancing conditions: age >68 and DM 10-year ASCVD risk score: The ASCVD Risk score (Arnett DK, et al., 2019) failed to calculate for the following reasons:   The 2019 ASCVD risk score is only valid for ages 27 to  66    Adherence Review: Is the patient currently on STATIN medication? Yes Does the patient have >5 day gap between last estimated fill dates? No   Chart Updates:  Recent office visits:  06/01/22 Anna Headings, MD - Patient presented for type 2 diabetes mellitus with hyperglycemia without long term current use of insulin and other concerns. Prescribed  Farxiga. Stopped Menthol Stopped Ozempic.  Recent consult visits:  None  Hospital visits:  None in previous 6 months  Medications: Outpatient Encounter Medications as of 06/24/2022  Medication Sig   aspirin 81 MG tablet Take 81 mg by mouth daily.   dapagliflozin propanediol (FARXIGA) 5 MG TABS tablet Take 1 tablet (5 mg total) by mouth daily before breakfast. Take 1/2 of 10 mg sample   glucose blood test strip Twice a day and as needed   hydrochlorothiazide (MICROZIDE) 12.5 MG capsule TAKE 1 CAPSULE BY MOUTH DAILY   Lancets (ACCU-CHEK SOFT TOUCH) lancets Use as instructed   losartan (COZAAR) 25 MG tablet TAKE 2 TABLETS BY MOUTH DAILY   metFORMIN (GLUCOPHAGE) 500 MG tablet TAKE 1 TABLET BY MOUTH DAILY   metoprolol succinate (TOPROL-XL) 50 MG 24 hr tablet Take 1 tablet (50 mg total) by mouth daily. PATIENT MUST SCHEDULE ANNUAL APPOINTMENT FOR FUTURE REFILLS   Misc Natural Products (TART CHERRY ADVANCED PO) Take 1 capsule by mouth daily.    Multiple Vitamins-Minerals (PRESERVISION AREDS 2+MULTI VIT PO) Take by mouth.   Polyethyl Glycol-Propyl Glycol (SYSTANE) 0.4-0.3 % GEL ophthalmic gel Place 1 application into both  eyes 2 (two) times daily.   rosuvastatin (CRESTOR) 5 MG tablet Take 1 tablet (5 mg total) by mouth daily.   Turmeric 500 MG CAPS Take 500 mg by mouth daily.   No facility-administered encounter medications on file as of 06/24/2022.    Recent Relevant Labs: Lab Results  Component Value Date/Time   HGBA1C 8.0 (H) 05/24/2022 09:04 AM   HGBA1C 7.8 (A) 01/08/2022 10:44 AM   HGBA1C 6.9 (A) 07/14/2021 09:07 AM   HGBA1C 8.0  (H) 04/13/2021 10:34 AM   MICROALBUR <0.7 05/24/2022 09:04 AM   MICROALBUR 0.7 06/16/2021 10:40 AM    Kidney Function Lab Results  Component Value Date/Time   CREATININE 1.01 01/08/2022 10:55 AM   CREATININE 0.81 08/08/2021 10:46 AM   CREATININE 0.86 10/08/2013 11:36 AM   GFR 51.17 (L) 01/08/2022 10:55 AM   GFRNONAA >60 08/08/2021 10:46 AM   GFRNONAA 66 10/08/2013 11:36 AM   GFRAA 74 04/19/2019 09:03 AM   GFRAA 76 10/08/2013 11:36 AM    Star Rating Drugs:  Losartan 25 mg - Last filled 04/23/22 90 DS at [facility] Metformin 500 mg - Last filled 06/09/22 90 DS at [facility] Rybelsus 3 mg - sampled Rosuvastatin 5 mg - Last filled 06/09/22 90 DS at [facility]  Care Gaps: Zoster Vaccine - Overdue TDAP - Overdue COVID Booster - Overdue AWV - 08/11/21      Anna Gamble CMA Clinical Pharmacist Assistant [phone]

## 2022-06-27 NOTE — Progress Notes (Signed)
Care Management & Coordination Services Pharmacy Note  06/27/2022 Name:  Anna Gamble MRN:  [id number] DOB:  April 22, 1935  Summary: LDL not at goal <70 (skipping many crestor doses due to muscle/joint pains) A1C not at goal <7  Recommendations/Changes made from today's visit: -Counseled to switch crestor to bedtime dosing to help with side effects and improve efficacy -Counseled on cholesterol-lowering and low-carb diet -Removed rybelsus from med list as patient could not tolerate stomach cramping and constipation -Recommended farxiga 5mg , patient defers to try diet changes alone to see how sugars/A1C improve at 5/6 lab work, will discuss with PCP at appt in May  Follow up plan:    Subjective: Anna Gamble is an 87 y.o. year old female who is a primary patient of [name], Neta Mends, MD.  The care coordination team was consulted for assistance with disease management and care coordination needs.    Engaged with patient by telephone for follow up visit.  Recent office visits: 06/01/22 [name], Neta Mends, MD - Patient presented for type 2 diabetes mellitus with hyperglycemia without long term current use of insulin and other concerns. Prescribed  Farxiga. Stopped Menthol Stopped Ozempic.   03/23/22 [name], Neta Mends, MD - Patent presented for Diabetes mellitus type 2 in obese and other concerns. Prescribed Rybelsus.    01/08/22 Eulis Foster, FNP - Patient presented for Diabetes mellitus type 2 in obese and other concerns. No medication changes.  Recent consult visits: 02/04/22 Shirleen Schirmer Forcucci, PA-C Laurette Schimke) - Patient presented for GERD with esophagitis without hemorrhage. Prescribed Omeprazole- Sodium bicarbonate.    Hospital visits: None in previous 6 months   Objective:  Lab Results  Component Value Date   CREATININE 1.01 01/08/2022   BUN 14 01/08/2022   GFR 51.17 (L) 01/08/2022   GFRNONAA >60 08/08/2021   GFRAA 74 04/19/2019   NA 141 01/08/2022   K 4.7  01/08/2022   CALCIUM 9.5 01/08/2022   CO2 34 (H) 01/08/2022   GLUCOSE 136 (H) 01/08/2022    Lab Results  Component Value Date/Time   HGBA1C 8.0 (H) 05/24/2022 09:04 AM   HGBA1C 7.8 (A) 01/08/2022 10:44 AM   HGBA1C 6.9 (A) 07/14/2021 09:07 AM   HGBA1C 8.0 (H) 04/13/2021 10:34 AM   GFR 51.17 (L) 01/08/2022 10:55 AM   GFR 62.38 04/13/2021 10:34 AM   MICROALBUR <0.7 05/24/2022 09:04 AM   MICROALBUR 0.7 06/16/2021 10:40 AM    Last diabetic Eye exam:  Lab Results  Component Value Date/Time   HMDIABEYEEXA No Retinopathy 01/25/2022 08:35 AM    Last diabetic Foot exam: No results found for: "HMDIABFOOTEX"   Lab Results  Component Value Date   CHOL 211 (H) 05/24/2022   HDL 47.60 05/24/2022   LDLCALC 75 03/18/2021   LDLDIRECT 146.0 05/24/2022   TRIG 226.0 (H) 05/24/2022   CHOLHDL 4 05/24/2022       Latest Ref Rng & Units 01/08/2022   10:55 AM 08/08/2021   10:46 AM 04/13/2021   10:34 AM  Hepatic Function  Total Protein 6.0 - 8.3 g/dL 6.8  6.7  7.1   Albumin 3.5 - 5.2 g/dL 4.0  4.0  4.2   AST 0 - 37 U/L 18  15  22    ALT 0 - 35 U/L 23  18  25    Alk Phosphatase 39 - 117 U/L 75  60  56   Total Bilirubin 0.2 - 1.2 mg/dL 0.4  0.6  0.7   Bilirubin, Direct 0.0 - 0.3 mg/dL  0.1     Lab Results  Component Value Date/Time   TSH 0.95 04/13/2021 10:34 AM   TSH 1.05 04/16/2015 10:43 AM       Latest Ref Rng & Units 01/08/2022   10:55 AM 08/08/2021   10:46 AM 04/13/2021   10:34 AM  CBC  WBC 4.0 - 10.5 K/uL 6.9  8.9  7.2   Hemoglobin 12.0 - 15.0 g/dL 40.9  81.1  91.4   Hematocrit 36.0 - 46.0 % 39.6  37.7  39.8   Platelets 150.0 - 400.0 K/uL 264.0  192  219.0     Lab Results  Component Value Date/Time   VD25OH 39 03/26/2008 10:01 PM   VITAMINB12 182 (L) 05/24/2022 09:04 AM   VITAMINB12 334 12/31/2009 11:14 AM    Clinical ASCVD: No  The ASCVD Risk score (Arnett DK, et al., 2019) failed to calculate for the following reasons:   The 2019 ASCVD risk score is only valid for ages  54 to 22        03/23/2022   11:30 AM 01/08/2022   10:41 AM 09/10/2021    1:15 PM  Depression screen PHQ 2/9  Decreased Interest 0 0 0  Down, Depressed, Hopeless 0 0 0  PHQ - 2 Score 0 0 0  Altered sleeping 1 1 1   Tired, decreased energy 1 0 1  Change in appetite 0 0 0  Feeling bad or failure about yourself  0 0 0  Trouble concentrating 0 0 0  Moving slowly or fidgety/restless 0 0 0  Suicidal thoughts 0 0 0  PHQ-9 Score 2 1 2   Difficult doing work/chores Not difficult at all Not difficult at all Not difficult at all     Social History   Tobacco Use  Smoking Status Never  Smokeless Tobacco Never   BP Readings from Last 3 Encounters:  06/01/22 120/70  03/23/22 128/62  01/08/22 130/70   Pulse Readings from Last 3 Encounters:  06/01/22 63  03/23/22 62  01/08/22 60   Wt Readings from Last 3 Encounters:  06/01/22 226 lb 6.4 oz (102.7 kg)  03/23/22 229 lb 12.8 oz (104.2 kg)  01/08/22 228 lb (103.4 kg)   BMI Readings from Last 3 Encounters:  06/01/22 41.41 kg/m  03/23/22 42.03 kg/m  01/08/22 41.70 kg/m    Allergies  Allergen Reactions   Lisinopril Cough   Statins Other (See Comments)    muscle aches with some statins, tolerates low doses of crestor   Semaglutide Other (See Comments)    Took rybelsus tab. Cause her to have abdominal pain.     Medications Reviewed Today     Reviewed by [name], MD (Physician) on 06/01/22 at 1158  Med List Status: <None>   Medication Order Taking? Sig Documenting Provider Last Dose Status Informant  aspirin 81 MG tablet [id number] Yes Take 81 mg by mouth daily. [provider] Taking Active Self  glucose blood test strip [id number] Yes Twice a day and as needed Worthy Rancher B, FNP Taking Active   hydrochlorothiazide (MICROZIDE) 12.5 MG capsule [id number] Yes TAKE 1 CAPSULE BY MOUTH DAILY Jens Som Madolyn Frieze, MD Taking Active   Lancets (ACCU-CHEK SOFT TOUCH) lancets [id number] Yes Use as instructed [name], Neta Mends, MD  Taking Active   losartan (COZAAR) 25 MG tablet [id number] Yes TAKE 2 TABLETS BY MOUTH DAILY Lewayne Bunting, MD Taking Active   metFORMIN (GLUCOPHAGE) 500 MG tablet [id number] Yes TAKE 1 TABLET BY MOUTH DAILY [name], Neta Mends, MD  Taking Active   metoprolol succinate (TOPROL-XL) 50 MG 24 hr tablet [id number] Yes TAKE 1 TABLET BY MOUTH  DAILY Crenshaw, Madolyn Frieze, MD Taking Active   Misc Natural Products (TART CHERRY ADVANCED PO) [id number] Yes Take 1 capsule by mouth daily.  [provider] Taking Active Self           Med Note Aurther Loft, Kenn File Jun 14, 2018  1:46 PM)    Multiple Vitamins-Minerals (PRESERVISION AREDS 2+MULTI VIT PO) [id number] Yes Take by mouth. [provider] Taking Active   Polyethyl Glycol-Propyl Glycol (SYSTANE) 0.4-0.3 % GEL ophthalmic gel [id number] Yes Place 1 application into both eyes 2 (two) times daily. [provider] Taking Active Self  rosuvastatin (CRESTOR) 5 MG tablet [id number] Yes Take 1 tablet (5 mg total) by mouth daily. Worthy Rancher B, FNP Taking Active   Turmeric 500 MG CAPS [id number] Yes Take 500 mg by mouth daily. [provider] Taking Active             SDOH:  (Social Determinants of Health) assessments and interventions performed: Yes SDOH Interventions    Flowsheet Row Care Coordination from 05/07/2022 in [facility] Chronic Care Management from 08/05/2021 in [facility] [place] Clinical Support from 07/24/2019 in [facility] [place] Clinical Support from 03/10/2018 in [facility] [place]  SDOH Interventions      Food Insecurity Interventions Intervention Not Indicated -- -- --  Housing Interventions Intervention Not Indicated -- -- --  Depression Interventions/Treatment  -- -- -- PHQ2-9 Score <4 Follow-up Not Indicated  Financial Strain Interventions -- Intervention Not Indicated -- --  Physical Activity Interventions -- --  Intervention Not Indicated --       Medication Assistance: None required.  Patient affirms current coverage meets needs.  Medication Access: Within the past 30 days, how often has patient missed a dose of medication? Intentionally skipping rosuvastatin most days of the week due to SE Is a pillbox or other method used to improve adherence? Yes  Factors that may affect medication adherence? adverse effects of medications Are meds synced by current pharmacy? No  Are meds delivered by current pharmacy? Yes  Does patient experience delays in picking up medications due to transportation concerns? No   [facility] Reviewed: Is patient disadvantaged to use [facility]?: Yes  Current Rx insurance plan: AARP Name and location of Current pharmacy:  [facility] - [place], [place] - [address] [place] [address] Phone: [phone] Fax: [phone]  [facility] [id number] [place], [place] - [address] [place] [address] Phone: [phone] Fax: [phone]  [facility] services reviewed with patient today?: No  Patient requests to transfer care to [facility]?: No  Reason patient declined to change pharmacies: Disadvantaged due to insurance/mail order  Compliance/Adherence/Medication fill history: Care Gaps: Zoster Vaccine - Overdue TDAP - Overdue COVID Booster - Overdue AWV - 08/11/21  Star-Rating Drugs: Rosuvastatin 5mg  PDC 63% Metformin 500mg  100% Losartan 25mg  PDC 100%   Assessment/Plan   Hyperlipidemia: (LDL goal < 70) -Uncontrolled -Current treatment: Rosuvastatin 5mg  1 qd Appropriate, Query Effective -Medications previously tried: None  -Current dietary patterns: see DM section -Current exercise habits: see DM section -Patient admits to skipping doses due to pains in muscles/joints when taking -Educated on Cholesterol goals;  Benefits of statin  for ASCVD risk reduction; Importance of limiting  foods high in cholesterol; Exercise goal of 150 minutes per week; Strategies to manage statin-induced myalgias; -Recommended to continue current medication but switch to taking at bedtime to help with aches/pains and to improve efficacy  Diabetes (A1c goal <7%) -Uncontrolled -Current medications: Rybelsus 3mg  1 qd - stopped taking due to gastro issues (cramping, constipation) Metformin 500mg  1 qd Appropriate, Query Effective -Medications previously tried: None  -Current home glucose readings fasting glucose: 160 today, 160 yesterday, 151 (177 in march is highest) -Denies hypoglycemic/hyperglycemic symptoms -Current meal patterns:  breakfast: sausage biscuit or bagel, french toast with banana and sugar free syrup, grits and an egg  lunch: tossed salad with bell pepper, chicken, onion, buttermilk dressing  dinner: chicken, slaw and potato last night, corn snacks: popcorn, 1/2 cup of ice cream occasionally, fruit drinks: water, coffee and cream with artificial sweetener (stevia), diet soda -Current exercise: limited due to arthritis, does do some gardening -Could not tolerate metformin dose increase (diarrhea) -Educated on A1c and blood sugar goals; Complications of diabetes including kidney damage, retinal damage, and cardiovascular disease; Exercise goal of 150 minutes per week; Benefits of routine self-monitoring of blood sugar; Carbohydrate counting and/or plate method -Counseled to check feet daily and get yearly eye exams -Counseled on diet and exercise extensively Recommended farxiga 5mg  but patient defers to try diet changes alone first, will consider starting if A1C/sugars still elevated at check in 3 weeks  Sherrill Raring Clinical Pharmacist [phone]

## 2022-06-30 ENCOUNTER — Ambulatory Visit: Payer: Medicare Other

## 2022-08-03 ENCOUNTER — Encounter: Payer: Self-pay | Admitting: Internal Medicine

## 2022-08-03 ENCOUNTER — Ambulatory Visit (INDEPENDENT_AMBULATORY_CARE_PROVIDER_SITE_OTHER): Payer: Medicare Other | Admitting: Internal Medicine

## 2022-08-03 VITALS — BP 126/68 | HR 66 | Temp 97.6°F | Ht 62.0 in | Wt 222.2 lb

## 2022-08-03 DIAGNOSIS — E1165 Type 2 diabetes mellitus with hyperglycemia: Secondary | ICD-10-CM | POA: Diagnosis not present

## 2022-08-03 DIAGNOSIS — H53143 Visual discomfort, bilateral: Secondary | ICD-10-CM | POA: Diagnosis not present

## 2022-08-03 DIAGNOSIS — Z7984 Long term (current) use of oral hypoglycemic drugs: Secondary | ICD-10-CM

## 2022-08-03 DIAGNOSIS — E538 Deficiency of other specified B group vitamins: Secondary | ICD-10-CM

## 2022-08-03 DIAGNOSIS — H01003 Unspecified blepharitis right eye, unspecified eyelid: Secondary | ICD-10-CM

## 2022-08-03 DIAGNOSIS — Z79899 Other long term (current) drug therapy: Secondary | ICD-10-CM | POA: Diagnosis not present

## 2022-08-03 DIAGNOSIS — T887XXA Unspecified adverse effect of drug or medicament, initial encounter: Secondary | ICD-10-CM

## 2022-08-03 DIAGNOSIS — H01006 Unspecified blepharitis left eye, unspecified eyelid: Secondary | ICD-10-CM

## 2022-08-03 LAB — POCT GLYCOSYLATED HEMOGLOBIN (HGB A1C): Hemoglobin A1C: 7.3 % — AB (ref 4.0–5.6)

## 2022-08-03 NOTE — Progress Notes (Signed)
Chief Complaint  Patient presents with   Medical Management of Chronic Issues    Follow up on DM.     HPI: Anna Gamble 87 y.o. come in for Chronic disease management  Ozempic   didn't take the first week .Marland Kitchen And then law suit .about vision loss is  hesitant   Sees dr Hyacinth Meeker and not getting better  Light senisitivity onset  last summer .  Rx doesn't help . ? Diabetic  neg?  Could not tolerate the farxiga samples . Trying to change diet .   ROS: See pertinent positives and negatives per HPI.  Past Medical History:  Diagnosis Date   CAD (coronary artery disease) 03/19/2014   Colon polyp    Coronary artery disease    GERD (gastroesophageal reflux disease)    Hx of colonoscopy with polypectomy 07/14/2012   medoff    Hyperlipidemia    Hypertension    Impaired fasting glucose    Morbid obesity (HCC)    S/P TAVR (transcatheter aortic valve replacement)    Severe aortic stenosis     Family History  Problem Relation Age of Onset   Coronary artery disease Other        female- 1st degree relative   Coronary artery disease Other        female- 1st degree relative   Hypertension Other    Hyperlipidemia Other    Heart attack Mother        age 85's   Heart attack Father        age 15's   Stroke Brother        died    Hearing loss Son    Sleep apnea Son    Aortic stenosis Sister    Stroke Brother    Heart attack Brother    Rheum arthritis Sister    Osteoarthritis Sister    Arthritis Sister    Heart Problems Brother     Social History   Socioeconomic History   Marital status: Married    Spouse name: Not on file   Number of children: 4   Years of education: Not on file   Highest education level: Not on file  Occupational History   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   Occupation: Charity fundraiser, peds, cardiology    Comment: retired  Tobacco Use   Smoking status: Never   Smokeless tobacco: Never  Vaping Use   Vaping status: Never Used   Substance and Sexual Activity   Alcohol use: No   Drug use: No   Sexual activity: Not Currently    Comment: quit 40 yrs ago  Other Topics Concern   Not on file  Social History Narrative   hh of 3 -4  Married   Grand son   At home    Invalid son paraplegia and husband    And grandson recently     No pets   Fluctuating hh membors she cooks and prepares for them          Social Determinants of Health   Financial Resource Strain: Low Risk  (08/11/2021)   Overall Financial Resource Strain (CARDIA)    Difficulty of Paying Living Expenses: Not hard at all  Food Insecurity: No Food Insecurity (06/30/2022)   Hunger Vital Sign    Worried About Running Out of Food in the Last Year: Never true    Ran Out of Food in the Last Year: Never true  Transportation Needs: No  Transportation Needs (08/11/2021)   PRAPARE - Administrator, Civil Service (Medical): No    Lack of Transportation (Non-Medical): No  Physical Activity: Inactive (08/11/2021)   Exercise Vital Sign    Days of Exercise per Week: 0 days    Minutes of Exercise per Session: 0 min  Stress: No Stress Concern Present (08/11/2021)   [employer] of Occupational Health - Occupational Stress Questionnaire    Feeling of Stress : Not at all  Social Connections: Socially Integrated (07/29/2020)   Social Connection and Isolation Panel [NHANES]    Frequency of Communication with Friends and Family: More than three times a week    Frequency of Social Gatherings with Friends and Family: Once a week    Attends Religious Services: 1 to 4 times per year    Active Member of [employer] or Organizations: Yes    Attends Banker Meetings: 1 to 4 times per year    Marital Status: Married    Outpatient Medications Prior to Visit  Medication Sig Dispense Refill   aspirin 81 MG tablet Take 81 mg by mouth daily.     glucose blood test strip Twice a day and as needed 200 each 1   hydrochlorothiazide (MICROZIDE) 12.5 MG capsule  TAKE 1 CAPSULE BY MOUTH DAILY 90 capsule 3   Lancets (ACCU-CHEK SOFT TOUCH) lancets Use as instructed 100 each 12   losartan (COZAAR) 25 MG tablet TAKE 2 TABLETS BY MOUTH DAILY 180 tablet 3   metFORMIN (GLUCOPHAGE) 500 MG tablet TAKE 1 TABLET BY MOUTH DAILY (Patient taking differently: Take 500 mg by mouth daily. Taking 2 tablets a day) 90 tablet 3   metoprolol succinate (TOPROL-XL) 50 MG 24 hr tablet Take 1 tablet (50 mg total) by mouth daily. PATIENT MUST SCHEDULE ANNUAL APPOINTMENT FOR FUTURE REFILLS 90 tablet 0   Misc Natural Products (TART CHERRY ADVANCED PO) Take 1 capsule by mouth daily.      Multiple Vitamins-Minerals (PRESERVISION AREDS 2+MULTI VIT PO) Take by mouth.     Polyethyl Glycol-Propyl Glycol (SYSTANE) 0.4-0.3 % GEL ophthalmic gel Place 1 application into both eyes 2 (two) times daily.     rosuvastatin (CRESTOR) 5 MG tablet Take 1 tablet (5 mg total) by mouth daily. 90 tablet 1   Semaglutide (OZEMPIC, 0.25 OR 0.5 MG/DOSE, [place]) Inject 0.25 mg into the skin once a week. (Patient not taking: Reported on 08/03/2022)     Turmeric 500 MG CAPS Take 500 mg by mouth daily. (Patient not taking: Reported on 08/03/2022)     No facility-administered medications prior to visit.     EXAM:  BP 126/68 (BP Location: Left Arm, Patient Position: Sitting, Cuff Size: Large)   Pulse 66   Temp 97.6 F (36.4 C) (Oral)   Ht 5\' 2"  (1.575 m)   Wt 222 lb 3.2 oz (100.8 kg)   SpO2 97%   BMI 40.64 kg/m   Body mass index is 40.64 kg/m.  GENERAL: vitals reviewed and listed above, alert, oriented, appears well hydrated and in no acute distress HEENT: atraumatic, conjunctiva  ? Clear tends to close eys with light , no obvious abnormalities on inspection of external nose and ears NECK: no obvious masses on inspection palpation  LUNGS: clear to auscultation bilaterally, no wheezes, rales or rhonchi, good air movement CV: HRRR, no clubbing cyanosis or  peripheral edema nl cap refill  MS: moves all  extremities without noticeable focal  abnormality Has roller chair  PSYCH: pleasant and cooperative, no  obvious depression or anxiety Lab Results  Component Value Date   WBC 6.9 01/08/2022   HGB 13.4 01/08/2022   HCT 39.6 01/08/2022   PLT 264.0 01/08/2022   GLUCOSE 136 (H) 01/08/2022   CHOL 211 (H) 05/24/2022   TRIG 226.0 (H) 05/24/2022   HDL 47.60 05/24/2022   LDLDIRECT 146.0 05/24/2022   LDLCALC 75 03/18/2021   ALT 23 01/08/2022   AST 18 01/08/2022   NA 141 01/08/2022   K 4.7 01/08/2022   CL 101 01/08/2022   CREATININE 1.01 01/08/2022   BUN 14 01/08/2022   CO2 34 (H) 01/08/2022   TSH 0.95 04/13/2021   INR 1.0 08/08/2021   HGBA1C 7.3 (A) 08/03/2022   MICROALBUR <0.7 05/24/2022   BP Readings from Last 3 Encounters:  08/03/22 126/68  06/01/22 120/70  03/23/22 128/62    ASSESSMENT AND PLAN:  Discussed the following assessment and plan:  Type 2 diabetes mellitus with hyperglycemia, without long-term current use of insulin (HCC) - improved despite stopping med from se will remain on metofmrin 500 x 2 per day - Plan: POC HgB A1c, Ambulatory referral to Ophthalmology  Eyes sensitive to light, bilateral - Plan: Ambulatory referral to Ophthalmology  Blepharitis of both eyes, unspecified eyelid, unspecified type - Plan: Ambulatory referral to Ophthalmology  Medication side effect  Medication management  Morbid obesity (HCC)  Low serum vitamin B12 Need to see   ophthalmology. Will refer  Fu 3 mos  -Patient advised to return or notify health care team  if  new concerns arise.  Patient Instructions   Will be notified about ophthalmology referral consult   as we discussed .  And you can ask about poss se of ozempic  . If sugars get rapidly in control sometimes  can get eye side effects .   Ol to take XR or  ER metformin    Continue vitamin supp that has b12  There are other meds to try for DM such as trulicity  a milder  form  for DM that helps with DM  but not as  much weight loss. It is still a weekly injection.   A1c today is  getting better  7.3   Wt Readings from Last 3 Encounters:  08/03/22 222 lb 3.2 oz (100.8 kg)  06/01/22 226 lb 6.4 oz (102.7 kg)  03/23/22 229 lb 12.8 oz (104.2 kg)     Lise Pincus K. Shelle Galdamez M.D.

## 2022-08-03 NOTE — Patient Instructions (Addendum)
  Will be notified about ophthalmology referral consult   as we discussed .  And you can ask about poss se of ozempic  . If sugars get rapidly in control sometimes  can get eye side effects .   Ol to take XR or  ER metformin    Continue vitamin supp that has b12  There are other meds to try for DM such as trulicity  a milder  form  for DM that helps with DM  but not as much weight loss. It is still a weekly injection.   A1c today is  getting better  7.3   Wt Readings from Last 3 Encounters:  08/03/22 222 lb 3.2 oz (100.8 kg)  06/01/22 226 lb 6.4 oz (102.7 kg)  03/23/22 229 lb 12.8 oz (104.2 kg)

## 2022-08-04 ENCOUNTER — Other Ambulatory Visit: Payer: Self-pay | Admitting: Family

## 2022-08-04 ENCOUNTER — Encounter: Payer: Self-pay | Admitting: Internal Medicine

## 2022-08-12 ENCOUNTER — Other Ambulatory Visit: Payer: Self-pay | Admitting: Cardiology

## 2022-08-13 ENCOUNTER — Ambulatory Visit: Payer: Medicare Other

## 2022-08-13 VITALS — BP 120/60 | HR 62 | Temp 97.7°F | Ht 62.0 in | Wt 224.1 lb

## 2022-08-13 DIAGNOSIS — Z0000 Encounter for general adult medical examination without abnormal findings: Secondary | ICD-10-CM | POA: Diagnosis not present

## 2022-08-13 NOTE — Progress Notes (Signed)
Subjective:   Haasini Yao is a 87 y.o. female who presents for Medicare Annual (Subsequent) preventive examination.  Visit Complete: In person  Patient Medicare AWV questionnaire was completed by the patient on  ; I have confirmed that all information answered by patient is correct and no changes since this date.  Review of Systems      Cardiac Risk Factors include: advanced age (>41men, >90 women);diabetes mellitus;hypertension     Objective:    Today's Vitals   08/13/22 1136  BP: 120/60  Pulse: 62  Temp: 97.7 F (36.5 C)  TempSrc: Oral  SpO2: 97%  Weight: 224 lb 1.6 oz (101.7 kg)  Height: 5\' 2"  (1.575 m)   Body mass index is 40.99 kg/m.     08/13/2022   11:58 AM 08/11/2021   12:41 PM 08/08/2021    8:40 AM 07/29/2020    1:15 PM 07/24/2019    1:44 PM 06/02/2018    2:09 PM 05/31/2018    8:02 AM  Advanced Directives  Does Patient Have a Medical Advance Directive? Yes No No Yes No No No  Type of Estate agent of [place];Living will   Healthcare Power of Attorney     Copy of Healthcare Power of Attorney in Chart? No - copy requested   No - copy requested     Would patient like information on creating a medical advance directive?   No - Patient declined  No - Patient declined No - Patient declined No - Patient declined    Current Medications (verified) Outpatient Encounter Medications as of 08/13/2022  Medication Sig   aspirin 81 MG tablet Take 81 mg by mouth daily.   glucose blood test strip Twice a day and as needed   hydrochlorothiazide (MICROZIDE) 12.5 MG capsule TAKE 1 CAPSULE BY MOUTH DAILY   Lancets (ACCU-CHEK SOFT TOUCH) lancets Use as instructed   losartan (COZAAR) 25 MG tablet TAKE 2 TABLETS BY MOUTH DAILY   metFORMIN (GLUCOPHAGE) 500 MG tablet TAKE 1 TABLET BY MOUTH DAILY (Patient taking differently: Take 500 mg by mouth daily. Taking 2 tablets a day)   metoprolol succinate (TOPROL-XL) 50 MG 24 hr tablet TAKE 1 TABLET BY MOUTH  DAILY   Misc Natural Products (TART CHERRY ADVANCED PO) Take 1 capsule by mouth daily.    Multiple Vitamins-Minerals (PRESERVISION AREDS 2+MULTI VIT PO) Take by mouth.   Polyethyl Glycol-Propyl Glycol (SYSTANE) 0.4-0.3 % GEL ophthalmic gel Place 1 application into both eyes 2 (two) times daily.   rosuvastatin (CRESTOR) 5 MG tablet Take 1 tablet (5 mg total) by mouth daily.   Semaglutide (OZEMPIC, 0.25 OR 0.5 MG/DOSE, [place]) Inject 0.25 mg into the skin once a week. (Patient not taking: Reported on 08/03/2022)   No facility-administered encounter medications on file as of 08/13/2022.    Allergies (verified) Lisinopril, Statins, and Semaglutide   History: Past Medical History:  Diagnosis Date   CAD (coronary artery disease) 03/19/2014   Colon polyp    Coronary artery disease    GERD (gastroesophageal reflux disease)    Hx of colonoscopy with polypectomy 07/14/2012   medoff    Hyperlipidemia    Hypertension    Impaired fasting glucose    Morbid obesity (HCC)    S/P TAVR (transcatheter aortic valve replacement)    Severe aortic stenosis    Past Surgical History:  Procedure Laterality Date   APPENDECTOMY     CHOLECYSTECTOMY     FOOT SURGERY     rt  KNEE SURGERY     rt   LEFT AND RIGHT HEART CATHETERIZATION WITH CORONARY ANGIOGRAM N/A 10/10/2013   Procedure: LEFT AND RIGHT HEART CATHETERIZATION WITH CORONARY ANGIOGRAM;  Surgeon: Kathleene Hazel, MD;  Location: [facility] CATH LAB;  Service: Cardiovascular;  Laterality: N/A;   RIGHT/LEFT HEART CATH AND CORONARY ANGIOGRAPHY N/A 04/06/2018   Procedure: RIGHT/LEFT HEART CATH AND CORONARY ANGIOGRAPHY;  Surgeon: Kathleene Hazel, MD;  Location: [facility] INVASIVE CV LAB;  Service: Cardiovascular;  Laterality: N/A;   TEE WITHOUT CARDIOVERSION N/A 06/06/2018   Procedure: TRANSESOPHAGEAL ECHOCARDIOGRAM (TEE);  Surgeon: Kathleene Hazel, MD;  Location: [facility] OR;  Service: Open Heart Surgery;  Laterality: N/A;   TONSILLECTOMY     TOTAL KNEE  ARTHROPLASTY  02/01/2011   Procedure: TOTAL KNEE ARTHROPLASTY;  Surgeon: Nestor Lewandowsky;  Location: [facility] OR;  Service: Orthopedics;  Laterality: Right;  DEPUY/SIGMA   TRANSCATHETER AORTIC VALVE REPLACEMENT, TRANSFEMORAL N/A 06/06/2018   Procedure: TRANSCATHETER AORTIC VALVE REPLACEMENT, TRANSFEMORAL;  Surgeon: Kathleene Hazel, MD;  Location: [facility] OR;  Service: Open Heart Surgery;  Laterality: N/A;   Family History  Problem Relation Age of Onset   Coronary artery disease Other        female- 1st degree relative   Coronary artery disease Other        female- 1st degree relative   Hypertension Other    Hyperlipidemia Other    Heart attack Mother        age 15's   Heart attack Father        age 75's   Stroke Brother        died    Hearing loss Son    Sleep apnea Son    Aortic stenosis Sister    Stroke Brother    Heart attack Brother    Rheum arthritis Sister    Osteoarthritis Sister    Arthritis Sister    Heart Problems Brother    Social History   Socioeconomic History   Marital status: Married    Spouse name: Not on file   Number of children: 4   Years of education: Not on file   Highest education level: Not on file  Occupational History   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   Occupation: Charity fundraiser, peds, cardiology    Comment: retired  Tobacco Use   Smoking status: Never   Smokeless tobacco: Never  Vaping Use   Vaping status: Never Used  Substance and Sexual Activity   Alcohol use: No   Drug use: No   Sexual activity: Not Currently    Comment: quit 40 yrs ago  Other Topics Concern   Not on file  Social History Narrative   hh of 3 -4  Married   Grand son   At home    Invalid son paraplegia and husband    And grandson recently     No pets   Fluctuating hh membors she cooks and prepares for them          Social Determinants of Health   Financial Resource Strain: Low Risk  (08/13/2022)   Overall Financial Resource Strain (CARDIA)     Difficulty of Paying Living Expenses: Not hard at all  Food Insecurity: No Food Insecurity (08/13/2022)   Hunger Vital Sign    Worried About Running Out of Food in the Last Year: Never true    Ran Out of Food in the Last Year: Never true  Transportation Needs: No Transportation  Needs (08/13/2022)   PRAPARE - Administrator, Civil Service (Medical): No    Lack of Transportation (Non-Medical): No  Physical Activity: Inactive (08/13/2022)   Exercise Vital Sign    Days of Exercise per Week: 0 days    Minutes of Exercise per Session: 0 min  Stress: No Stress Concern Present (08/13/2022)   [employer] of Occupational Health - Occupational Stress Questionnaire    Feeling of Stress : Not at all  Social Connections: Socially Integrated (08/13/2022)   Social Connection and Isolation Panel [NHANES]    Frequency of Communication with Friends and Family: More than three times a week    Frequency of Social Gatherings with Friends and Family: More than three times a week    Attends Religious Services: More than 4 times per year    Active Member of [employer] or Organizations: Yes    Attends Engineer, structural: More than 4 times per year    Marital Status: Married    Tobacco Counseling Counseling given: Not Answered   Clinical Intake:  Pre-visit preparation completed: No  Pain : No/denies pain     BMI - recorded: 40.99 Nutritional Status: BMI > 30  Obese Nutritional Risks: None Diabetes: Yes CBG done?: Yes (CBG 133 Taken by patient) CBG resulted in Enter/ Edit results?: Yes Did pt. bring in CBG monitor from home?: No  How often do you need to have someone help you when you read instructions, pamphlets, or other written materials from your doctor or pharmacy?: 1 - Never  Interpreter Needed?: No  Information entered by :: Theresa Mulligan LPN   Activities of Daily Living    08/13/2022   11:54 AM  In your present state of health, do you have any difficulty  performing the following activities:  Hearing? 0  Vision? 0  Difficulty concentrating or making decisions? 0  Walking or climbing stairs? 0  Dressing or bathing? 0  Doing errands, shopping? 0  Preparing Food and eating ? N  Using the Toilet? N  In the past six months, have you accidently leaked urine? Y  Comment Wears breifs. Followed Urologist  Do you have problems with loss of bowel control? N  Managing your Medications? N  Managing your Finances? N  Housekeeping or managing your Housekeeping? N    Patient Care Team: [name], Neta Mends, MD as PCP - General Jens Som, Madolyn Frieze, MD as PCP - Cardiology (Cardiology) Nyoka Cowden, MD (Pulmonary Disease) Lunette Stands, MD (Orthopedic Surgery) Sharrell Ku, MD (Gastroenterology) Lewayne Bunting, MD as Attending Physician (Cardiology) Blima Ledger, OD (Optometry) Waggoner, Milas Kocher, [facility] (Inactive) (Pharmacist)  Indicate any recent Medical Services you may have received from other than [facility] providers in the past year (date may be approximate).     Assessment:   This is a routine wellness examination for Shreya.  Hearing/Vision screen Hearing Screening - Comments:: Denies hearing difficulties   Vision Screening - Comments:: Wears rx glasses - up to date with routine eye exams with  [facility]  Dietary issues and exercise activities discussed:     Goals Addressed               This Visit's Progress     patient (pt-stated)        Wants to have more energy Wants to be more active.            Depression Screen    08/13/2022   11:53 AM 03/23/2022   11:30 AM 01/08/2022  10:41 AM 09/10/2021    1:15 PM 08/11/2021   12:42 PM 07/14/2021    9:41 AM 04/13/2021    9:57 AM  PHQ 2/9 Scores  PHQ - 2 Score 0 0 0 0 0 1 1  PHQ- 9 Score  2 1 2  6 7     Fall Risk    08/13/2022   11:55 AM 03/23/2022   11:30 AM 01/08/2022   10:42 AM 09/10/2021    1:15 PM 08/11/2021   12:41 PM  Fall Risk   Falls in the past year? 1 1 1 1 1    Comment     loses balance  Number falls in past yr: 0 1 1 1 1   Injury with Fall? 0 1 1 1 1   Risk for fall due to : No Fall Risks Other (Comment) Impaired balance/gait;Impaired mobility Other (Comment) History of fall(s);Impaired balance/gait;Impaired mobility;Medication side effect  Follow up Falls prevention discussed Falls evaluation completed Falls evaluation completed Falls evaluation completed Falls evaluation completed;Education provided;Falls prevention discussed    MEDICARE RISK AT HOME:  Medicare Risk at Home - 08/13/22 1203     Any stairs in or around the home? No    If so, are there any without handrails? No    Home free of loose throw rugs in walkways, pet beds, electrical cords, etc? Yes    Adequate lighting in your home to reduce risk of falls? Yes    Life alert? No    Use of a cane, walker or w/c? Yes    Grab bars in the bathroom? Yes    Shower chair or bench in shower? Yes    Elevated toilet seat or a handicapped toilet? Yes             TIMED UP AND GO:  Was the test performed?  Yes  Length of time to ambulate 10 feet: 10 sec Gait slow and steady with assistive device    Cognitive Function:        08/13/2022   11:59 AM 08/11/2021   12:45 PM 07/29/2020    1:20 PM 07/24/2019    2:06 PM 11/09/2016    8:53 AM  6CIT Screen  What Year? 0 points 0 points 0 points 0 points 0 points  What month? 0 points 0 points 0 points 0 points 0 points  What time? 0 points 0 points 0 points 0 points 0 points  Count back from 20 0 points 0 points 0 points 0 points 0 points  Months in reverse 0 points 0 points 0 points 0 points 0 points  Repeat phrase 0 points 4 points 0 points 2 points 0 points  Total Score 0 points 4 points 0 points 2 points 0 points    Immunizations Immunization History  Administered Date(s) Administered   Fluad Quad(high Dose 65+) 10/06/2018, 01/10/2020   Influenza Split 09/18/2012   Influenza Whole 12/27/2006, 11/01/2007, 11/08/2008, 10/29/2009,  12/02/2011   Influenza, High Dose Seasonal PF 10/27/2016, 11/14/2017   Influenza-Unspecified 12/19/2014, 11/01/2021   PFIZER(Purple Top)SARS-COV-2 Vaccination 02/07/2019, 02/28/2019, 05/16/2020   Pneumococcal Conjugate-13 01/15/2013   Pneumococcal Polysaccharide-23 01/13/2002, 04/16/2015   Td 03/18/1996, 12/31/2009    TDAP status: Due, Education has been provided regarding the importance of this vaccine. Advised may receive this vaccine at local pharmacy or Health Dept. Aware to provide a copy of the vaccination record if obtained from local pharmacy or Health Dept. Verbalized acceptance and understanding.  Flu Vaccine status: Up to date  Pneumococcal vaccine status: Up to  date  Covid-19 vaccine status: Completed vaccines  Qualifies for Shingles Vaccine? Yes   Zostavax completed No   Shingrix Completed?: No.    Education has been provided regarding the importance of this vaccine. Patient has been advised to call insurance company to determine out of pocket expense if they have not yet received this vaccine. Advised may also receive vaccine at local pharmacy or Health Dept. Verbalized acceptance and understanding.  Screening Tests Health Maintenance  Topic Date Due   DTaP/Tdap/Td (3 - Tdap) 01/01/2020   COVID-19 Vaccine (4 - 2023-24 season) 08/29/2022 (Originally 09/18/2021)   Zoster Vaccines- Shingrix (1 of 2) 11/13/2022 (Originally 08/29/1954)   INFLUENZA VACCINE  08/19/2022   Diabetic kidney evaluation - eGFR measurement  01/09/2023   OPHTHALMOLOGY EXAM  01/26/2023   HEMOGLOBIN A1C  02/03/2023   FOOT EXAM  03/23/2023   Diabetic kidney evaluation - Urine ACR  05/24/2023   Medicare Annual Wellness (AWV)  08/13/2023   Pneumonia Vaccine 8+ Years old  Completed   DEXA SCAN  Completed   HPV VACCINES  Aged Out    Health Maintenance  Health Maintenance Due  Topic Date Due   DTaP/Tdap/Td (3 - Tdap) 01/01/2020    Colorectal cancer screening: No longer required.   Mammogram  status: No longer required due to Age.  Bone Density status: Completed 07/15/21. Results reflect: Bone density results: OSTEOPOROSIS. Repeat every   years.  Lung Cancer Screening: (Low Dose CT Chest recommended if Age 74-80 years, 20 pack-year currently smoking OR have quit w/in 15years.) does not qualify.     Additional Screening:  Hepatitis C Screening: does not qualify; Completed   Vision Screening: Recommended annual ophthalmology exams for early detection of glaucoma and other disorders of the eye. Is the patient up to date with their annual eye exam?  Yes  Who is the provider or what is the name of the office in which the patient attends annual eye exams? [facility] If pt is not established with a provider, would they like to be referred to a provider to establish care? No .   Dental Screening: Recommended annual dental exams for proper oral hygiene  Diabetic Foot Exam: Diabetic Foot Exam: Completed 03/23/22  Community Resource Referral / Chronic Care Management:  CRR required this visit?  No   CCM required this visit?  No     Plan:     I have personally reviewed and noted the following in the patient's chart:   Medical and social history Use of alcohol, tobacco or illicit drugs  Current medications and supplements including opioid prescriptions. Patient is not currently taking opioid prescriptions. Functional ability and status Nutritional status Physical activity Advanced directives List of other physicians Hospitalizations, surgeries, and ER visits in previous 12 months Vitals Screenings to include cognitive, depression, and falls Referrals and appointments  In addition, I have reviewed and discussed with patient certain preventive protocols, quality metrics, and best practice recommendations. A written personalized care plan for preventive services as well as general preventive health recommendations were provided to patient.     Tillie Rung,  LPN   07/17/5282   After Visit Summary: Given  Nurse Notes: None

## 2022-08-13 NOTE — Patient Instructions (Addendum)
Anna Gamble , Thank you for taking time to come for your Medicare Wellness Visit. I appreciate your ongoing commitment to your health goals. Please review the following plan we discussed and let me know if I can assist you in the future.   Referrals/Orders/Follow-Ups/Clinician Recommendations:   This is a list of the screening recommended for you and due dates:  Health Maintenance  Topic Date Due   DTaP/Tdap/Td vaccine (3 - Tdap) 01/01/2020   COVID-19 Vaccine (4 - 2023-24 season) 08/29/2022*   Zoster (Shingles) Vaccine (1 of 2) 11/13/2022*   Flu Shot  08/19/2022   Yearly kidney function blood test for diabetes  01/09/2023   Eye exam for diabetics  01/26/2023   Hemoglobin A1C  02/03/2023   Complete foot exam   03/23/2023   Yearly kidney health urinalysis for diabetes  05/24/2023   Medicare Annual Wellness Visit  08/13/2023   Pneumonia Vaccine  Completed   DEXA scan (bone density measurement)  Completed   HPV Vaccine  Aged Out  *Topic was postponed. The date shown is not the original due date.    Advanced directives: (Copy Requested) Please bring a copy of your health care power of attorney and living will to the office to be added to your chart at your convenience.  Next Medicare Annual Wellness Visit scheduled for next year: Yes  Preventive Care 28 Years and Older, Female Preventive care refers to lifestyle choices and visits with your health care provider that can promote health and wellness. What does preventive care include? A yearly physical exam. This is also called an annual well check. Dental exams once or twice a year. Routine eye exams. Ask your health care provider how often you should have your eyes checked. Personal lifestyle choices, including: Daily care of your teeth and gums. Regular physical activity. Eating a healthy diet. Avoiding tobacco and drug use. Limiting alcohol use. Practicing safe sex. Taking low-dose aspirin every day. Taking vitamin and mineral  supplements as recommended by your health care provider. What happens during an annual well check? The services and screenings done by your health care provider during your annual well check will depend on your age, overall health, lifestyle risk factors, and family history of disease. Counseling  Your health care provider may ask you questions about your: Alcohol use. Tobacco use. Drug use. Emotional well-being. Home and relationship well-being. Sexual activity. Eating habits. History of falls. Memory and ability to understand (cognition). Work and work Astronomer. Reproductive health. Screening  You may have the following tests or measurements: Height, weight, and BMI. Blood pressure. Lipid and cholesterol levels. These may be checked every 5 years, or more frequently if you are over 72 years old. Skin check. Lung cancer screening. You may have this screening every year starting at age 82 if you have a 30-pack-year history of smoking and currently smoke or have quit within the past 15 years. Fecal occult blood test (FOBT) of the stool. You may have this test every year starting at age 60. Flexible sigmoidoscopy or colonoscopy. You may have a sigmoidoscopy every 5 years or a colonoscopy every 10 years starting at age 7. Hepatitis C blood test. Hepatitis B blood test. Sexually transmitted disease (STD) testing. Diabetes screening. This is done by checking your blood sugar (glucose) after you have not eaten for a while (fasting). You may have this done every 1-3 years. Bone density scan. This is done to screen for osteoporosis. You may have this done starting at age 96. Mammogram. This  may be done every 1-2 years. Talk to your health care provider about how often you should have regular mammograms. Talk with your health care provider about your test results, treatment options, and if necessary, the need for more tests. Vaccines  Your health care provider may recommend certain  vaccines, such as: Influenza vaccine. This is recommended every year. Tetanus, diphtheria, and acellular pertussis (Tdap, Td) vaccine. You may need a Td booster every 10 years. Zoster vaccine. You may need this after age 27. Pneumococcal 13-valent conjugate (PCV13) vaccine. One dose is recommended after age 74. Pneumococcal polysaccharide (PPSV23) vaccine. One dose is recommended after age 62. Talk to your health care provider about which screenings and vaccines you need and how often you need them. This information is not intended to replace advice given to you by your health care provider. Make sure you discuss any questions you have with your health care provider. Document Released: 01/31/2015 Document Revised: 09/24/2015 Document Reviewed: 11/05/2014 Elsevier Interactive Patient Education  2017 [employer].  Fall Prevention in the Home Falls can cause injuries. They can happen to people of all ages. There are many things you can do to make your home safe and to help prevent falls. What can I do on the outside of my home? Regularly fix the edges of walkways and driveways and fix any cracks. Remove anything that might make you trip as you walk through a door, such as a raised step or threshold. Trim any bushes or trees on the path to your home. Use bright outdoor lighting. Clear any walking paths of anything that might make someone trip, such as rocks or tools. Regularly check to see if handrails are loose or broken. Make sure that both sides of any steps have handrails. Any raised decks and porches should have guardrails on the edges. Have any leaves, snow, or ice cleared regularly. Use sand or salt on walking paths during winter. Clean up any spills in your garage right away. This includes oil or grease spills. What can I do in the bathroom? Use night lights. Install grab bars by the toilet and in the tub and shower. Do not use towel bars as grab bars. Use non-skid mats or decals in  the tub or shower. If you need to sit down in the shower, use a plastic, non-slip stool. Keep the floor dry. Clean up any water that spills on the floor as soon as it happens. Remove soap buildup in the tub or shower regularly. Attach bath mats securely with double-sided non-slip rug tape. Do not have throw rugs and other things on the floor that can make you trip. What can I do in the bedroom? Use night lights. Make sure that you have a light by your bed that is easy to reach. Do not use any sheets or blankets that are too big for your bed. They should not hang down onto the floor. Have a firm chair that has side arms. You can use this for support while you get dressed. Do not have throw rugs and other things on the floor that can make you trip. What can I do in the kitchen? Clean up any spills right away. Avoid walking on wet floors. Keep items that you use a lot in easy-to-reach places. If you need to reach something above you, use a strong step stool that has a grab bar. Keep electrical cords out of the way. Do not use floor polish or wax that makes floors slippery. If you must  use wax, use non-skid floor wax. Do not have throw rugs and other things on the floor that can make you trip. What can I do with my stairs? Do not leave any items on the stairs. Make sure that there are handrails on both sides of the stairs and use them. Fix handrails that are broken or loose. Make sure that handrails are as long as the stairways. Check any carpeting to make sure that it is firmly attached to the stairs. Fix any carpet that is loose or worn. Avoid having throw rugs at the top or bottom of the stairs. If you do have throw rugs, attach them to the floor with carpet tape. Make sure that you have a light switch at the top of the stairs and the bottom of the stairs. If you do not have them, ask someone to add them for you. What else can I do to help prevent falls? Wear shoes that: Do not have high  heels. Have rubber bottoms. Are comfortable and fit you well. Are closed at the toe. Do not wear sandals. If you use a stepladder: Make sure that it is fully opened. Do not climb a closed stepladder. Make sure that both sides of the stepladder are locked into place. Ask someone to hold it for you, if possible. Clearly mark and make sure that you can see: Any grab bars or handrails. First and last steps. Where the edge of each step is. Use tools that help you move around (mobility aids) if they are needed. These include: Canes. Walkers. Scooters. Crutches. Turn on the lights when you go into a dark area. Replace any light bulbs as soon as they burn out. Set up your furniture so you have a clear path. Avoid moving your furniture around. If any of your floors are uneven, fix them. If there are any pets around you, be aware of where they are. Review your medicines with your doctor. Some medicines can make you feel dizzy. This can increase your chance of falling. Ask your doctor what other things that you can do to help prevent falls. This information is not intended to replace advice given to you by your health care provider. Make sure you discuss any questions you have with your health care provider. Document Released: 10/31/2008 Document Revised: 06/12/2015 Document Reviewed: 02/08/2014 Elsevier Interactive Patient Education  2017 [employer].

## 2022-08-27 ENCOUNTER — Emergency Department (HOSPITAL_COMMUNITY): Payer: Medicare Other

## 2022-08-27 ENCOUNTER — Inpatient Hospital Stay (HOSPITAL_COMMUNITY)
Admission: EM | Admit: 2022-08-27 | Discharge: 2022-08-31 | DRG: 152 | Disposition: A | Discharge status: Rehabilitation Facility | Payer: Medicare Other | Attending: Internal Medicine | Admitting: Internal Medicine

## 2022-08-27 ENCOUNTER — Other Ambulatory Visit: Payer: Self-pay

## 2022-08-27 DIAGNOSIS — W010XXA Fall on same level from slipping, tripping and stumbling without subsequent striking against object, initial encounter: Secondary | ICD-10-CM | POA: Diagnosis present

## 2022-08-27 DIAGNOSIS — J019 Acute sinusitis, unspecified: Secondary | ICD-10-CM | POA: Diagnosis not present

## 2022-08-27 DIAGNOSIS — M1712 Unilateral primary osteoarthritis, left knee: Secondary | ICD-10-CM | POA: Diagnosis not present

## 2022-08-27 DIAGNOSIS — R531 Weakness: Secondary | ICD-10-CM | POA: Diagnosis not present

## 2022-08-27 DIAGNOSIS — Z7982 Long term (current) use of aspirin: Secondary | ICD-10-CM

## 2022-08-27 DIAGNOSIS — M25561 Pain in right knee: Secondary | ICD-10-CM | POA: Diagnosis not present

## 2022-08-27 DIAGNOSIS — Z471 Aftercare following joint replacement surgery: Secondary | ICD-10-CM | POA: Diagnosis not present

## 2022-08-27 DIAGNOSIS — S8992XA Unspecified injury of left lower leg, initial encounter: Secondary | ICD-10-CM | POA: Diagnosis not present

## 2022-08-27 DIAGNOSIS — R140 Abdominal distension (gaseous): Secondary | ICD-10-CM | POA: Diagnosis not present

## 2022-08-27 DIAGNOSIS — K219 Gastro-esophageal reflux disease without esophagitis: Secondary | ICD-10-CM | POA: Diagnosis not present

## 2022-08-27 DIAGNOSIS — E785 Hyperlipidemia, unspecified: Secondary | ICD-10-CM | POA: Diagnosis not present

## 2022-08-27 DIAGNOSIS — S0990XA Unspecified injury of head, initial encounter: Secondary | ICD-10-CM | POA: Diagnosis not present

## 2022-08-27 DIAGNOSIS — Y92002 Bathroom of unspecified non-institutional (private) residence single-family (private) house as the place of occurrence of the external cause: Secondary | ICD-10-CM | POA: Diagnosis not present

## 2022-08-27 DIAGNOSIS — J329 Chronic sinusitis, unspecified: Secondary | ICD-10-CM | POA: Diagnosis not present

## 2022-08-27 DIAGNOSIS — E872 Acidosis, unspecified: Secondary | ICD-10-CM | POA: Diagnosis present

## 2022-08-27 DIAGNOSIS — M549 Dorsalgia, unspecified: Secondary | ICD-10-CM | POA: Diagnosis not present

## 2022-08-27 DIAGNOSIS — E871 Hypo-osmolality and hyponatremia: Secondary | ICD-10-CM | POA: Diagnosis not present

## 2022-08-27 DIAGNOSIS — M19041 Primary osteoarthritis, right hand: Secondary | ICD-10-CM | POA: Diagnosis not present

## 2022-08-27 DIAGNOSIS — Z8261 Family history of arthritis: Secondary | ICD-10-CM

## 2022-08-27 DIAGNOSIS — E1165 Type 2 diabetes mellitus with hyperglycemia: Secondary | ICD-10-CM | POA: Diagnosis not present

## 2022-08-27 DIAGNOSIS — I6782 Cerebral ischemia: Secondary | ICD-10-CM | POA: Diagnosis not present

## 2022-08-27 DIAGNOSIS — Z7984 Long term (current) use of oral hypoglycemic drugs: Secondary | ICD-10-CM | POA: Diagnosis not present

## 2022-08-27 DIAGNOSIS — Z888 Allergy status to other drugs, medicaments and biological substances status: Secondary | ICD-10-CM | POA: Diagnosis not present

## 2022-08-27 DIAGNOSIS — Z823 Family history of stroke: Secondary | ICD-10-CM

## 2022-08-27 DIAGNOSIS — I350 Nonrheumatic aortic (valve) stenosis: Secondary | ICD-10-CM

## 2022-08-27 DIAGNOSIS — G9341 Metabolic encephalopathy: Secondary | ICD-10-CM | POA: Diagnosis present

## 2022-08-27 DIAGNOSIS — H04129 Dry eye syndrome of unspecified lacrimal gland: Secondary | ICD-10-CM | POA: Diagnosis not present

## 2022-08-27 DIAGNOSIS — S6991XA Unspecified injury of right wrist, hand and finger(s), initial encounter: Secondary | ICD-10-CM | POA: Diagnosis not present

## 2022-08-27 DIAGNOSIS — Z96651 Presence of right artificial knee joint: Secondary | ICD-10-CM | POA: Diagnosis present

## 2022-08-27 DIAGNOSIS — Z79899 Other long term (current) drug therapy: Secondary | ICD-10-CM | POA: Diagnosis not present

## 2022-08-27 DIAGNOSIS — Z86010 Personal history of colonic polyps: Secondary | ICD-10-CM | POA: Diagnosis not present

## 2022-08-27 DIAGNOSIS — I10 Essential (primary) hypertension: Secondary | ICD-10-CM | POA: Diagnosis present

## 2022-08-27 DIAGNOSIS — Z1152 Encounter for screening for COVID-19: Secondary | ICD-10-CM | POA: Diagnosis not present

## 2022-08-27 DIAGNOSIS — Z794 Long term (current) use of insulin: Secondary | ICD-10-CM | POA: Diagnosis not present

## 2022-08-27 DIAGNOSIS — Z6841 Body Mass Index (BMI) 40.0 and over, adult: Secondary | ICD-10-CM

## 2022-08-27 DIAGNOSIS — I2510 Atherosclerotic heart disease of native coronary artery without angina pectoris: Secondary | ICD-10-CM | POA: Diagnosis present

## 2022-08-27 DIAGNOSIS — R6510 Systemic inflammatory response syndrome (SIRS) of non-infectious origin without acute organ dysfunction: Secondary | ICD-10-CM | POA: Diagnosis present

## 2022-08-27 DIAGNOSIS — Z043 Encounter for examination and observation following other accident: Secondary | ICD-10-CM | POA: Diagnosis not present

## 2022-08-27 DIAGNOSIS — Z7985 Long-term (current) use of injectable non-insulin antidiabetic drugs: Secondary | ICD-10-CM | POA: Diagnosis not present

## 2022-08-27 DIAGNOSIS — E876 Hypokalemia: Secondary | ICD-10-CM | POA: Diagnosis not present

## 2022-08-27 DIAGNOSIS — B9689 Other specified bacterial agents as the cause of diseases classified elsewhere: Secondary | ICD-10-CM | POA: Diagnosis not present

## 2022-08-27 DIAGNOSIS — S8991XA Unspecified injury of right lower leg, initial encounter: Secondary | ICD-10-CM | POA: Diagnosis not present

## 2022-08-27 DIAGNOSIS — Z952 Presence of prosthetic heart valve: Secondary | ICD-10-CM | POA: Diagnosis not present

## 2022-08-27 DIAGNOSIS — M79641 Pain in right hand: Secondary | ICD-10-CM | POA: Diagnosis not present

## 2022-08-27 DIAGNOSIS — W19XXXA Unspecified fall, initial encounter: Secondary | ICD-10-CM | POA: Diagnosis not present

## 2022-08-27 DIAGNOSIS — Z8249 Family history of ischemic heart disease and other diseases of the circulatory system: Secondary | ICD-10-CM

## 2022-08-27 DIAGNOSIS — M25511 Pain in right shoulder: Secondary | ICD-10-CM | POA: Diagnosis not present

## 2022-08-27 DIAGNOSIS — R519 Headache, unspecified: Secondary | ICD-10-CM | POA: Diagnosis not present

## 2022-08-27 DIAGNOSIS — M25562 Pain in left knee: Secondary | ICD-10-CM | POA: Diagnosis not present

## 2022-08-27 DIAGNOSIS — R5381 Other malaise: Secondary | ICD-10-CM | POA: Diagnosis not present

## 2022-08-27 DIAGNOSIS — J342 Deviated nasal septum: Secondary | ICD-10-CM | POA: Diagnosis not present

## 2022-08-27 DIAGNOSIS — Z9049 Acquired absence of other specified parts of digestive tract: Secondary | ICD-10-CM | POA: Diagnosis not present

## 2022-08-27 DIAGNOSIS — E119 Type 2 diabetes mellitus without complications: Secondary | ICD-10-CM | POA: Diagnosis not present

## 2022-08-27 DIAGNOSIS — R0902 Hypoxemia: Secondary | ICD-10-CM | POA: Diagnosis not present

## 2022-08-27 LAB — CBC WITH DIFFERENTIAL/PLATELET
Abs Immature Granulocytes: 0.06 10*3/uL (ref 0.00–0.07)
Basophils Absolute: 0 10*3/uL (ref 0.0–0.1)
Basophils Relative: 0 %
Eosinophils Absolute: 0 10*3/uL (ref 0.0–0.5)
Eosinophils Relative: 0 %
HCT: 37 % (ref 36.0–46.0)
Hemoglobin: 12.5 g/dL (ref 12.0–15.0)
Immature Granulocytes: 1 %
Lymphocytes Relative: 5 %
Lymphs Abs: 0.6 10*3/uL — ABNORMAL LOW (ref 0.7–4.0)
MCH: 31.4 pg (ref 26.0–34.0)
MCHC: 33.8 g/dL (ref 30.0–36.0)
MCV: 93 fL (ref 80.0–100.0)
Monocytes Absolute: 0.3 10*3/uL (ref 0.1–1.0)
Monocytes Relative: 2 %
Neutro Abs: 11 10*3/uL — ABNORMAL HIGH (ref 1.7–7.7)
Neutrophils Relative %: 92 %
Platelets: 171 10*3/uL (ref 150–400)
RBC: 3.98 MIL/uL (ref 3.87–5.11)
RDW: 13.3 % (ref 11.5–15.5)
WBC: 12 10*3/uL — ABNORMAL HIGH (ref 4.0–10.5)
nRBC: 0 % (ref 0.0–0.2)

## 2022-08-27 LAB — URINALYSIS, W/ REFLEX TO CULTURE (INFECTION SUSPECTED)
Bilirubin Urine: NEGATIVE
Glucose, UA: NEGATIVE mg/dL
Hgb urine dipstick: NEGATIVE
Ketones, ur: 5 mg/dL — AB
Leukocytes,Ua: NEGATIVE
Nitrite: NEGATIVE
Protein, ur: 30 mg/dL — AB
Specific Gravity, Urine: 1.024 (ref 1.005–1.030)
pH: 6 (ref 5.0–8.0)

## 2022-08-27 LAB — COMPREHENSIVE METABOLIC PANEL
ALT: 27 U/L (ref 0–44)
AST: 25 U/L (ref 15–41)
Albumin: 3.1 g/dL — ABNORMAL LOW (ref 3.5–5.0)
Alkaline Phosphatase: 59 U/L (ref 38–126)
Anion gap: 11 (ref 5–15)
BUN: 14 mg/dL (ref 8–23)
CO2: 24 mmol/L (ref 22–32)
Calcium: 8.6 mg/dL — ABNORMAL LOW (ref 8.9–10.3)
Chloride: 97 mmol/L — ABNORMAL LOW (ref 98–111)
Creatinine, Ser: 0.94 mg/dL (ref 0.44–1.00)
GFR, Estimated: 60 mL/min — ABNORMAL LOW (ref >60)
Glucose, Bld: 210 mg/dL — ABNORMAL HIGH (ref 70–99)
Potassium: 3.7 mmol/L (ref 3.5–5.1)
Sodium: 132 mmol/L — ABNORMAL LOW (ref 135–145)
Total Bilirubin: 1.7 mg/dL — ABNORMAL HIGH (ref 0.3–1.2)
Total Protein: 6.2 g/dL — ABNORMAL LOW (ref 6.5–8.1)

## 2022-08-27 LAB — PROTIME-INR
INR: 1.1 (ref 0.8–1.2)
Prothrombin Time: 14.5 seconds (ref 11.4–15.2)

## 2022-08-27 LAB — I-STAT CG4 LACTIC ACID, ED
Lactic Acid, Venous: 2.5 mmol/L (ref 0.5–1.9)
Lactic Acid, Venous: 2.1 mmol/L (ref 0.5–1.9)
Lactic Acid, Venous: 1.4 mmol/L (ref 0.5–1.9)

## 2022-08-27 LAB — MAGNESIUM: Magnesium: 1.8 mg/dL (ref 1.7–2.4)

## 2022-08-27 LAB — RESP PANEL BY RT-PCR (RSV, FLU A&B, COVID)  RVPGX2
Influenza A by PCR: NEGATIVE
Influenza B by PCR: NEGATIVE
Resp Syncytial Virus by PCR: NEGATIVE
SARS Coronavirus 2 by RT PCR: NEGATIVE

## 2022-08-27 LAB — TROPONIN I (HIGH SENSITIVITY)
Troponin I (High Sensitivity): 12 ng/L (ref <18)
Troponin I (High Sensitivity): 12 ng/L (ref <18)

## 2022-08-27 LAB — APTT: aPTT: 35 seconds (ref 24–36)

## 2022-08-27 MED ORDER — LACTATED RINGERS IV BOLUS
500.0000 mL | Freq: Once | INTRAVENOUS | Status: AC
  Administered 2022-08-27: 500 mL via INTRAVENOUS

## 2022-08-27 MED ORDER — ACETAMINOPHEN 325 MG PO TABS: 650.0000 mg | ORAL_TABLET | Freq: Four times a day (QID) | ORAL | Status: DC | PRN

## 2022-08-27 MED ORDER — VANCOMYCIN HCL 1750 MG/350ML IV SOLN
1750.0000 mg | INTRAVENOUS | Status: DC
  Filled 2022-08-27: qty 350

## 2022-08-27 MED ORDER — METRONIDAZOLE 500 MG/100ML IV SOLN
500.0000 mg | Freq: Two times a day (BID) | INTRAVENOUS | Status: DC
  Administered 2022-08-27 – 2022-08-29 (×4): 500 mg via INTRAVENOUS
  Filled 2022-08-27 (×4): qty 100

## 2022-08-27 MED ORDER — SODIUM CHLORIDE 0.9 % IV SOLN
2.0000 g | Freq: Two times a day (BID) | INTRAVENOUS | Status: DC
  Administered 2022-08-27 – 2022-08-29 (×4): 2 g via INTRAVENOUS
  Filled 2022-08-27 (×4): qty 12.5

## 2022-08-27 MED ORDER — SODIUM CHLORIDE 0.9 % IV SOLN: 1.0000 g | Freq: Once | INTRAVENOUS | Status: DC

## 2022-08-27 MED ORDER — ACETAMINOPHEN 500 MG PO TABS
1000.0000 mg | ORAL_TABLET | Freq: Once | ORAL | Status: AC
  Administered 2022-08-27: 1000 mg via ORAL
  Filled 2022-08-27: qty 2

## 2022-08-27 MED ORDER — VANCOMYCIN HCL 2000 MG/400ML IV SOLN
2000.0000 mg | Freq: Once | INTRAVENOUS | Status: AC
  Administered 2022-08-27: 2000 mg via INTRAVENOUS
  Filled 2022-08-27 (×2): qty 400

## 2022-08-27 MED ORDER — SODIUM CHLORIDE 0.9 % IV SOLN: 2.0000 g | Freq: Three times a day (TID) | INTRAVENOUS | Status: DC

## 2022-08-27 MED ORDER — LACTATED RINGERS IV BOLUS
1000.0000 mL | Freq: Once | INTRAVENOUS | Status: AC
  Administered 2022-08-27: 1000 mL via INTRAVENOUS

## 2022-08-27 MED ORDER — LACTATED RINGERS IV SOLN
150.0000 mL/h | INTRAVENOUS | Status: AC
  Administered 2022-08-27 (×2): 150 mL/h via INTRAVENOUS

## 2022-08-27 MED ORDER — OXYCODONE HCL 5 MG PO TABS
5.0000 mg | ORAL_TABLET | Freq: Once | ORAL | Status: AC
  Administered 2022-08-27: 5 mg via ORAL
  Filled 2022-08-27: qty 1

## 2022-08-27 NOTE — Progress Notes (Signed)
[facility] night cross cover note:   I was notified by RN that the patient is complaining of headache as well as back pain.  I subsequently placed orders for acetaminophen 1 g p.o. x 1 dose now as well as order for acetaminophen 650 mg p.o. every 6 hours as needed for pain, headache.     Newton Pigg, DO Hospitalist

## 2022-08-27 NOTE — ED Notes (Signed)
ED TO INPATIENT HANDOFF REPORT  ED Nurse Name and Phone #: Theophilus Bones [phone]  S Name/Age/Gender Anna Gamble 87 y.o. female Room/Bed: 024C/024C  Code Status   Code Status: Prior  Home/SNF/Other Home Patient oriented to: self, place, time, and situation Is this baseline? Yes   Triage Complete: Triage complete  Chief Complaint Fall  Triage Note Pt arrives to ED c/o bilateral knee pain post mechanical fall when trying to sit down on chair at home. Pt denies LOC/headstrike/ or use of thinners. Pt endorses feeling weak last several days, denies fever   Allergies Allergies  Allergen Reactions   Lisinopril Cough   Statins Other (See Comments)    muscle aches with some statins, tolerates low doses of crestor   Semaglutide Other (See Comments)    Took rybelsus tab. Cause her to have abdominal pain.     Level of Care/Admitting Diagnosis ED Disposition     ED Disposition  Admit   Condition  --   Comment  The patient appears reasonably stabilized for admission considering the current resources, flow, and capabilities available in the ED at this time, and I doubt any other [facility] requiring further screening and/or treatment in the ED prior to admission is  present.          B Medical/Surgery History Past Medical History:  Diagnosis Date   CAD (coronary artery disease) 03/19/2014   Colon polyp    Coronary artery disease    GERD (gastroesophageal reflux disease)    Hx of colonoscopy with polypectomy 07/14/2012   medoff    Hyperlipidemia    Hypertension    Impaired fasting glucose    Morbid obesity (HCC)    S/P TAVR (transcatheter aortic valve replacement)    Severe aortic stenosis    Past Surgical History:  Procedure Laterality Date   APPENDECTOMY     CHOLECYSTECTOMY     FOOT SURGERY     rt   KNEE SURGERY     rt   LEFT AND RIGHT HEART CATHETERIZATION WITH CORONARY ANGIOGRAM N/A 10/10/2013   Procedure: LEFT AND RIGHT HEART CATHETERIZATION WITH CORONARY  ANGIOGRAM;  Surgeon: Kathleene Hazel, MD;  Location: [facility] CATH LAB;  Service: Cardiovascular;  Laterality: N/A;   RIGHT/LEFT HEART CATH AND CORONARY ANGIOGRAPHY N/A 04/06/2018   Procedure: RIGHT/LEFT HEART CATH AND CORONARY ANGIOGRAPHY;  Surgeon: Kathleene Hazel, MD;  Location: [facility] INVASIVE CV LAB;  Service: Cardiovascular;  Laterality: N/A;   TEE WITHOUT CARDIOVERSION N/A 06/06/2018   Procedure: TRANSESOPHAGEAL ECHOCARDIOGRAM (TEE);  Surgeon: Kathleene Hazel, MD;  Location: [facility] OR;  Service: Open Heart Surgery;  Laterality: N/A;   TONSILLECTOMY     TOTAL KNEE ARTHROPLASTY  02/01/2011   Procedure: TOTAL KNEE ARTHROPLASTY;  Surgeon: Nestor Lewandowsky;  Location: [facility] OR;  Service: Orthopedics;  Laterality: Right;  DEPUY/SIGMA   TRANSCATHETER AORTIC VALVE REPLACEMENT, TRANSFEMORAL N/A 06/06/2018   Procedure: TRANSCATHETER AORTIC VALVE REPLACEMENT, TRANSFEMORAL;  Surgeon: Kathleene Hazel, MD;  Location: [facility] OR;  Service: Open Heart Surgery;  Laterality: N/A;     A IV Location/Drains/Wounds Patient Lines/Drains/Airways Status     Active Line/Drains/Airways     Name Placement date Placement time Site Days   Peripheral IV 08/27/22 20 G 1" Anterior;Proximal;Right Forearm 08/27/22  0649  Forearm  less than 1   External Urinary Catheter 08/08/21  1420  --  384            Intake/Output Last 24 hours No intake or output data in the 24  hours ending 08/27/22 1555  Labs/Imaging Results for orders placed or performed during the hospital encounter of 08/27/22 (from the past 48 hour(s))  CBC with Differential     Status: Abnormal   Collection Time: 08/27/22  6:51 AM  Result Value Ref Range   WBC 12.0 (H) 4.0 - 10.5 K/uL   RBC 3.98 3.87 - 5.11 MIL/uL   Hemoglobin 12.5 12.0 - 15.0 g/dL   HCT 78.4 69.6 - 29.5 %   MCV 93.0 80.0 - 100.0 fL   MCH 31.4 26.0 - 34.0 pg   MCHC 33.8 30.0 - 36.0 g/dL   RDW 28.4 13.2 - 44.0 %   Platelets 171 150 - 400 K/uL   nRBC 0.0 0.0 - 0.2 %    Neutrophils Relative % 92 %   Neutro Abs 11.0 (H) 1.7 - 7.7 K/uL   Lymphocytes Relative 5 %   Lymphs Abs 0.6 (L) 0.7 - 4.0 K/uL   Monocytes Relative 2 %   Monocytes Absolute 0.3 0.1 - 1.0 K/uL   Eosinophils Relative 0 %   Eosinophils Absolute 0.0 0.0 - 0.5 K/uL   Basophils Relative 0 %   Basophils Absolute 0.0 0.0 - 0.1 K/uL   Immature Granulocytes 1 %   Abs Immature Granulocytes 0.06 0.00 - 0.07 K/uL    Comment: Performed at [facility], [address], [place], [place] [address]  Comprehensive metabolic panel     Status: Abnormal   Collection Time: 08/27/22  6:51 AM  Result Value Ref Range   Sodium 132 (L) 135 - 145 mmol/L   Potassium 3.7 3.5 - 5.1 mmol/L   Chloride 97 (L) 98 - 111 mmol/L   CO2 24 22 - 32 mmol/L   Glucose, Bld 210 (H) 70 - 99 mg/dL    Comment: Glucose reference range applies only to samples taken after fasting for at least 8 hours.   BUN 14 8 - 23 mg/dL   Creatinine, Ser 5.36 0.44 - 1.00 mg/dL   Calcium 8.6 (L) 8.9 - 10.3 mg/dL   Total Protein 6.2 (L) 6.5 - 8.1 g/dL   Albumin 3.1 (L) 3.5 - 5.0 g/dL   AST 25 15 - 41 U/L   ALT 27 0 - 44 U/L   Alkaline Phosphatase 59 38 - 126 U/L   Total Bilirubin 1.7 (H) 0.3 - 1.2 mg/dL   GFR, Estimated 60 (L) >60 mL/min    Comment: (NOTE) Calculated using the CKD-EPI Creatinine Equation (2021)    Anion gap 11 5 - 15    Comment: Performed at [facility], [address], [place], [place] [address]  Magnesium     Status: None   Collection Time: 08/27/22  6:51 AM  Result Value Ref Range   Magnesium 1.8 1.7 - 2.4 mg/dL    Comment: Performed at [facility], [address], [place], [place] [address]  Troponin I (High Sensitivity)     Status: None   Collection Time: 08/27/22  6:51 AM  Result Value Ref Range   Troponin I (High Sensitivity) 12 <18 ng/L    Comment: (NOTE) Elevated high sensitivity troponin I (hsTnI) values and significant  changes across serial measurements may suggest ACS but many other   chronic and acute conditions are known to elevate hsTnI results.  Refer to the "Links" section for chest pain algorithms and additional  guidance. Performed at [facility], [address] [place], [place] [address]   Urinalysis, w/ Reflex to  Culture (Infection Suspected) -Urine, Clean Catch     Status: Abnormal   Collection Time: 08/27/22  7:05 AM  Result Value Ref Range   Specimen Source URINE, CLEAN CATCH    Color, Urine AMBER (A) YELLOW    Comment: BIOCHEMICALS MAY BE AFFECTED BY COLOR   APPearance CLEAR CLEAR   Specific Gravity, Urine 1.024 1.005 - 1.030   pH 6.0 5.0 - 8.0   Glucose, UA NEGATIVE NEGATIVE mg/dL   Hgb urine dipstick NEGATIVE NEGATIVE   Bilirubin Urine NEGATIVE NEGATIVE   Ketones, ur 5 (A) NEGATIVE mg/dL   Protein, ur 30 (A) NEGATIVE mg/dL   Nitrite NEGATIVE NEGATIVE   Leukocytes,Ua NEGATIVE NEGATIVE   RBC / HPF 0-5 0 - 5 RBC/hpf   WBC, UA 0-5 0 - 5 WBC/hpf    Comment:        Reflex urine culture not performed if WBC <=10, OR if Squamous epithelial cells >5. If Squamous epithelial cells >5 suggest recollection.    Bacteria, UA RARE (A) NONE SEEN   Squamous Epithelial / HPF 0-5 0 - 5 /HPF   Mucus PRESENT     Comment: Performed at [facility], [address] [place], [place] [address]  Resp panel by RT-PCR (RSV, Flu A&B, Covid) Anterior Nasal Swab     Status: None   Collection Time: 08/27/22  7:55 AM   Specimen: Anterior Nasal Swab  Result Value Ref Range   SARS Coronavirus 2 by RT PCR NEGATIVE NEGATIVE   Influenza A by PCR NEGATIVE NEGATIVE   Influenza B by PCR NEGATIVE NEGATIVE    Comment: (NOTE) The Xpert Xpress SARS-CoV-2/FLU/RSV plus assay is intended as an aid in the diagnosis of influenza from Nasopharyngeal swab specimens and should not be used as a sole basis for treatment. Nasal washings and aspirates are unacceptable for Xpert Xpress SARS-CoV-2/FLU/RSV testing.  Fact Sheet for  Patients: BloggerCourse.com  Fact Sheet for Healthcare Providers: SeriousBroker.it  This test is not yet approved or cleared by the [place] FDA and has been authorized for detection and/or diagnosis of SARS-CoV-2 by FDA under an Emergency Use Authorization (EUA). This EUA will remain in effect (meaning this test can be used) for the duration of the COVID-19 declaration under Section 564(b)(1) of the Act, 21 U.S.C. section 360bbb-3(b)(1), unless the authorization is terminated or revoked.     Resp Syncytial Virus by PCR NEGATIVE NEGATIVE    Comment: (NOTE) Fact Sheet for Patients: BloggerCourse.com  Fact Sheet for Healthcare Providers: SeriousBroker.it  This test is not yet approved or cleared by the [place] FDA and has been authorized for detection and/or diagnosis of SARS-CoV-2 by FDA under an Emergency Use Authorization (EUA). This EUA will remain in effect (meaning this test can be used) for the duration of the COVID-19 declaration under Section 564(b)(1) of the Act, 21 U.S.C. section 360bbb-3(b)(1), unless the authorization is terminated or revoked.  Performed at [facility], [address], [place], [place] [address]   Troponin I (High Sensitivity)     Status: None   Collection Time: 08/27/22  9:05 AM  Result Value Ref Range   Troponin I (High Sensitivity) 12 <18 ng/L    Comment: (NOTE) Elevated high sensitivity troponin I (hsTnI) values and significant  changes across serial measurements may suggest ACS but many other  chronic and acute conditions are known to elevate hsTnI results.  Refer to the "Links" section for chest pain algorithms and additional  guidance. Performed at [facility], [address] [place], [place] [address]   I-Stat CG4 Lactic Acid     Status: Abnormal   Collection Time: 08/27/22  1:01 PM  Result Value Ref Range    Lactic Acid, Venous 2.5 (HH) 0.5 - 1.9 mmol/L   Comment NOTIFIED PHYSICIAN   I-Stat CG4 Lactic Acid     Status: Abnormal   Collection Time: 08/27/22  3:39 PM  Result Value Ref Range   Lactic Acid, Venous 2.1 (HH) 0.5 - 1.9 mmol/L   Comment NOTIFIED PHYSICIAN    CT Head Wo Contrast  Result Date: 08/27/2022 CLINICAL DATA:  Head trauma, minor, normal mental status (Age 36-64y) EXAM: CT HEAD WITHOUT CONTRAST TECHNIQUE: Contiguous axial images were obtained from the base of the skull through the vertex without intravenous contrast. RADIATION DOSE REDUCTION: This exam was performed according to the departmental dose-optimization program which includes automated exposure control, adjustment of the mA and/or kV according to patient size and/or use of iterative reconstruction technique. COMPARISON:  02/16/2016 FINDINGS: Brain: No evidence of acute infarction, hemorrhage, hydrocephalus, extra-axial collection or mass lesion/mass effect. Incidental note of a small amount of subependymal gray matter heterotopia. Patchy low-density changes within the periventricular and subcortical white matter most compatible with chronic microvascular ischemic change. Mild diffuse cerebral volume loss. Vascular: No hyperdense vessel or unexpected calcification. Skull: Normal. Negative for fracture or focal lesion. Sinuses/Orbits: No acute finding. Other: None. IMPRESSION: 1. No acute intracranial findings. 2. Chronic microvascular ischemic change and cerebral volume loss. Electronically Signed   By: Duanne Guess D.O.   On: 08/27/2022 15:10   DG Hand 2 View Right  Result Date: 08/27/2022 CLINICAL DATA:  87 year old female with history of trauma from a fall. Right-sided hand pain. EXAM: RIGHT HAND - 2 VIEW COMPARISON:  Right hand radiograph 04/27/2017. FINDINGS: Two views of the right hand demonstrate no acute displaced fracture or dislocation. Multifocal joint space narrowing, subchondral sclerosis, subchondral cyst formation  and osteophyte formation, most severe in the DIP and PIP joints, as well as the first [facility] joint, compatible with osteoarthritis. IMPRESSION: 1. No acute radiographic abnormality of the right hand. 2. Degenerative changes of osteoarthritis redemonstrated, as above. Electronically Signed   By: Trudie Reed M.D.   On: 08/27/2022 08:10   DG Knee 2 Views Left  Result Date: 08/27/2022 CLINICAL DATA:  87 year old female with history of trauma from a fall complaining of left knee pain. EXAM: LEFT KNEE - 1-2 VIEW COMPARISON:  No priors. FINDINGS: Two views of the left knee demonstrate no acute displaced fracture or dislocation. There is joint space narrowing, subchondral sclerosis, subchondral cyst formation and osteophyte formation in a tricompartmental distribution, most severe in the medial and patellofemoral compartments, compatible with osteoarthritis. Soft tissues are grossly unremarkable. IMPRESSION: 1. No acute radiographic abnormality of the left knee. 2. Tricompartmental osteoarthritis, most severe in the medial and patellofemoral compartments. Electronically Signed   By: Trudie Reed M.D.   On: 08/27/2022 08:05   DG Knee Complete 4 Views Right  Result Date: 08/27/2022 CLINICAL DATA:  87 year old female with history of trauma from a fall. Right-sided knee pain. EXAM: RIGHT KNEE - COMPLETE 4+ VIEW COMPARISON:  None Available. FINDINGS: Four views of the right knee demonstrate postoperative changes of total knee arthroplasty. The femoral and tibial component of the prosthesis appear well seated without periprosthetic fracture or other acute abnormalities. Soft tissues are unremarkable in appearance. IMPRESSION: 1. No acute radiographic abnormality of the right knee. 2. Status post total knee arthroplasty. Electronically  Signed   By: Trudie Reed M.D.   On: 08/27/2022 08:04   DG Chest 2 View  Result Date: 08/27/2022 CLINICAL DATA:  87 year old female status post fall. EXAM: CHEST - 2 VIEW  COMPARISON:  Portable chest 06/06/2018 and earlier. FINDINGS: Semi upright AP and lateral views of the chest at 0722 hours. Sequelae of TAVR. Lung volumes and mediastinal contours are stable. Visualized tracheal air column is within normal limits. No pneumothorax, pulmonary edema, pleural effusion or confluent lung opacity. Osteopenia. No acute osseous abnormality identified. Paucity of bowel gas. IMPRESSION: No acute cardiopulmonary abnormality. Electronically Signed   By: Odessa Fleming M.D.   On: 08/27/2022 08:00    Pending Labs Unresulted Labs (From admission, onward)    None       Vitals/Pain Today's Vitals   08/27/22 1300 08/27/22 1330 08/27/22 1400 08/27/22 1430  BP: (!) 120/58 115/63 (!) 117/56 118/66  Pulse: 80 77 76 81  Resp: 17 17 17 18   Temp: 98.3 F (36.8 C)     TempSrc: Oral     SpO2: 98% 97% 96% 95%  Height:      PainSc:        Isolation Precautions No active isolations  Medications Medications  cefTRIAXone (ROCEPHIN) 1 g in sodium chloride 0.9 % 100 mL IVPB (has no administration in time range)  lactated ringers bolus 500 mL (0 mLs Intravenous Stopped 08/27/22 0954)  oxyCODONE (Oxy IR/ROXICODONE) immediate release tablet 5 mg (5 mg Oral Given 08/27/22 1230)  lactated ringers bolus 1,000 mL (1,000 mLs Intravenous Bolus 08/27/22 1229)    Mobility walks with device     Focused Assessments Musculoskeletal   R Recommendations: See Admitting Provider Note  Report given to:   Additional Notes: requires extensive assistance with sitting up on side of bed.

## 2022-08-27 NOTE — ED Notes (Signed)
ED TO INPATIENT HANDOFF REPORT  ED Nurse Name and Phone #:  Minerva Areola RN   S Name/Age/Gender Burns Spain 87 y.o. female Room/Bed: 024C/024C  Code Status   Code Status: Full Code  Home/SNF/Other Home Patient oriented to: self, place, time, and situation Is this baseline? Yes   Triage Complete: Triage complete  Chief Complaint SIRS (systemic inflammatory response syndrome) (HCC) [R65.10]  Triage Note Pt arrives to ED c/o bilateral knee pain post mechanical fall when trying to sit down on chair at home. Pt denies LOC/headstrike/ or use of thinners. Pt endorses feeling weak last several days, denies fever   Allergies Allergies  Allergen Reactions   Lisinopril Cough   Statins Other (See Comments)    muscle aches with some statins, tolerates low doses of crestor   Semaglutide Other (See Comments)    Took rybelsus tab. Cause her to have abdominal pain.     Level of Care/Admitting Diagnosis ED Disposition     ED Disposition  Admit   Condition  --   Comment  Hospital Area: [facility] [[id number]]  Level of Care: Progressive [102]  Admit to Progressive based on following criteria: MULTISYSTEM THREATS such as stable sepsis, metabolic/electrolyte imbalance with or without encephalopathy that is responding to early treatment.  May admit patient to [facility] or [facility] if equivalent level of care is available:: No  Covid Evaluation: Confirmed COVID Negative  Diagnosis: SIRS (systemic inflammatory response syndrome) [facility]) [[id number]]  Admitting Physician: Chiquita Loth  Attending Physician: Starleen Arms [[id number]]  Bed request comments: 5 WEST  Certification:: I certify this patient will need inpatient services for at least 2 midnights  Estimated Length of Stay: 2          B Medical/Surgery History Past Medical History:  Diagnosis Date   CAD (coronary artery disease) 03/19/2014   Colon polyp    Coronary artery disease    GERD  (gastroesophageal reflux disease)    Hx of colonoscopy with polypectomy 07/14/2012   medoff    Hyperlipidemia    Hypertension    Impaired fasting glucose    Morbid obesity (HCC)    S/P TAVR (transcatheter aortic valve replacement)    Severe aortic stenosis    Past Surgical History:  Procedure Laterality Date   APPENDECTOMY     CHOLECYSTECTOMY     FOOT SURGERY     rt   KNEE SURGERY     rt   LEFT AND RIGHT HEART CATHETERIZATION WITH CORONARY ANGIOGRAM N/A 10/10/2013   Procedure: LEFT AND RIGHT HEART CATHETERIZATION WITH CORONARY ANGIOGRAM;  Surgeon: Kathleene Hazel, MD;  Location: [facility] CATH LAB;  Service: Cardiovascular;  Laterality: N/A;   RIGHT/LEFT HEART CATH AND CORONARY ANGIOGRAPHY N/A 04/06/2018   Procedure: RIGHT/LEFT HEART CATH AND CORONARY ANGIOGRAPHY;  Surgeon: Kathleene Hazel, MD;  Location: [facility] INVASIVE CV LAB;  Service: Cardiovascular;  Laterality: N/A;   TEE WITHOUT CARDIOVERSION N/A 06/06/2018   Procedure: TRANSESOPHAGEAL ECHOCARDIOGRAM (TEE);  Surgeon: Kathleene Hazel, MD;  Location: [facility] OR;  Service: Open Heart Surgery;  Laterality: N/A;   TONSILLECTOMY     TOTAL KNEE ARTHROPLASTY  02/01/2011   Procedure: TOTAL KNEE ARTHROPLASTY;  Surgeon: Nestor Lewandowsky;  Location: [facility] OR;  Service: Orthopedics;  Laterality: Right;  DEPUY/SIGMA   TRANSCATHETER AORTIC VALVE REPLACEMENT, TRANSFEMORAL N/A 06/06/2018   Procedure: TRANSCATHETER AORTIC VALVE REPLACEMENT, TRANSFEMORAL;  Surgeon: Kathleene Hazel, MD;  Location: [facility] OR;  Service: Open Heart Surgery;  Laterality: N/A;  A IV Location/Drains/Wounds Patient Lines/Drains/Airways Status     Active Line/Drains/Airways     Name Placement date Placement time Site Days   Peripheral IV 08/27/22 20 G 1" Anterior;Proximal;Right Forearm 08/27/22  0649  Forearm  less than 1   Peripheral IV 08/27/22 22 G 1" Posterior;Right Hand 08/27/22  1650  Hand  less than 1   External Urinary Catheter 08/08/21  1420  --  384             Intake/Output Last 24 hours  Intake/Output Summary (Last 24 hours) at 08/27/2022 1922 Last data filed at 08/27/2022 1851 Gross per 24 hour  Intake 200 ml  Output --  Net 200 ml    Labs/Imaging Results for orders placed or performed during the hospital encounter of 08/27/22 (from the past 48 hour(s))  CBC with Differential     Status: Abnormal   Collection Time: 08/27/22  6:51 AM  Result Value Ref Range   WBC 12.0 (H) 4.0 - 10.5 K/uL   RBC 3.98 3.87 - 5.11 MIL/uL   Hemoglobin 12.5 12.0 - 15.0 g/dL   HCT 16.1 09.6 - 04.5 %   MCV 93.0 80.0 - 100.0 fL   MCH 31.4 26.0 - 34.0 pg   MCHC 33.8 30.0 - 36.0 g/dL   RDW 40.9 81.1 - 91.4 %   Platelets 171 150 - 400 K/uL   nRBC 0.0 0.0 - 0.2 %   Neutrophils Relative % 92 %   Neutro Abs 11.0 (H) 1.7 - 7.7 K/uL   Lymphocytes Relative 5 %   Lymphs Abs 0.6 (L) 0.7 - 4.0 K/uL   Monocytes Relative 2 %   Monocytes Absolute 0.3 0.1 - 1.0 K/uL   Eosinophils Relative 0 %   Eosinophils Absolute 0.0 0.0 - 0.5 K/uL   Basophils Relative 0 %   Basophils Absolute 0.0 0.0 - 0.1 K/uL   Immature Granulocytes 1 %   Abs Immature Granulocytes 0.06 0.00 - 0.07 K/uL    Comment: Performed at [facility], [address] [place], [place] [address]  Comprehensive metabolic panel     Status: Abnormal   Collection Time: 08/27/22  6:51 AM  Result Value Ref Range   Sodium 132 (L) 135 - 145 mmol/L   Potassium 3.7 3.5 - 5.1 mmol/L   Chloride 97 (L) 98 - 111 mmol/L   CO2 24 22 - 32 mmol/L   Glucose, Bld 210 (H) 70 - 99 mg/dL    Comment: Glucose reference range applies only to samples taken after fasting for at least 8 hours.   BUN 14 8 - 23 mg/dL   Creatinine, Ser 6.21 0.44 - 1.00 mg/dL   Calcium 8.6 (L) 8.9 - 10.3 mg/dL   Total Protein 6.2 (L) 6.5 - 8.1 g/dL   Albumin 3.1 (L) 3.5 - 5.0 g/dL   AST 25 15 - 41 U/L   ALT 27 0 - 44 U/L   Alkaline Phosphatase 59 38 - 126 U/L   Total Bilirubin 1.7 (H) 0.3 - 1.2 mg/dL   GFR, Estimated 60 (L)  >60 mL/min    Comment: (NOTE) Calculated using the CKD-EPI Creatinine Equation (2021)    Anion gap 11 5 - 15    Comment: Performed at [facility], [address] [place], [place] [address]  Magnesium     Status: None   Collection Time: 08/27/22  6:51 AM  Result Value Ref Range   Magnesium 1.8 1.7 - 2.4 mg/dL  Comment: Performed at [facility], [address] [place], [place] [address]  Troponin I (High Sensitivity)     Status: None   Collection Time: 08/27/22  6:51 AM  Result Value Ref Range   Troponin I (High Sensitivity) 12 <18 ng/L    Comment: (NOTE) Elevated high sensitivity troponin I (hsTnI) values and significant  changes across serial measurements may suggest ACS but many other  chronic and acute conditions are known to elevate hsTnI results.  Refer to the "Links" section for chest pain algorithms and additional  guidance. Performed at [facility], [address], [place], [place] [address]   Urinalysis, w/ Reflex to Culture (Infection Suspected) -Urine, Clean Catch     Status: Abnormal   Collection Time: 08/27/22  7:05 AM  Result Value Ref Range   Specimen Source URINE, CLEAN CATCH    Color, Urine AMBER (A) YELLOW    Comment: BIOCHEMICALS MAY BE AFFECTED BY COLOR   APPearance CLEAR CLEAR   Specific Gravity, Urine 1.024 1.005 - 1.030   pH 6.0 5.0 - 8.0   Glucose, UA NEGATIVE NEGATIVE mg/dL   Hgb urine dipstick NEGATIVE NEGATIVE   Bilirubin Urine NEGATIVE NEGATIVE   Ketones, ur 5 (A) NEGATIVE mg/dL   Protein, ur 30 (A) NEGATIVE mg/dL   Nitrite NEGATIVE NEGATIVE   Leukocytes,Ua NEGATIVE NEGATIVE   RBC / HPF 0-5 0 - 5 RBC/hpf   WBC, UA 0-5 0 - 5 WBC/hpf    Comment:        Reflex urine culture not performed if WBC <=10, OR if Squamous epithelial cells >5. If Squamous epithelial cells >5 suggest recollection.    Bacteria, UA RARE (A) NONE SEEN   Squamous Epithelial / HPF 0-5 0 - 5 /HPF   Mucus PRESENT     Comment: Performed at [facility], [address] [place], [place] [address]  Resp panel by RT-PCR (RSV, Flu A&B, Covid) Anterior Nasal Swab     Status: None   Collection Time: 08/27/22  7:55 AM   Specimen: Anterior Nasal Swab  Result Value Ref Range   SARS Coronavirus 2 by RT PCR NEGATIVE NEGATIVE   Influenza A by PCR NEGATIVE NEGATIVE   Influenza B by PCR NEGATIVE NEGATIVE    Comment: (NOTE) The Xpert Xpress SARS-CoV-2/FLU/RSV plus assay is intended as an aid in the diagnosis of influenza from Nasopharyngeal swab specimens and should not be used as a sole basis for treatment. Nasal washings and aspirates are unacceptable for Xpert Xpress SARS-CoV-2/FLU/RSV testing.  Fact Sheet for Patients: BloggerCourse.com  Fact Sheet for Healthcare Providers: SeriousBroker.it  This test is not yet approved or cleared by the [place] FDA and has been authorized for detection and/or diagnosis of SARS-CoV-2 by FDA under an Emergency Use Authorization (EUA). This EUA will remain in effect (meaning this test can be used) for the duration of the COVID-19 declaration under Section 564(b)(1) of the Act, 21 U.S.C. section 360bbb-3(b)(1), unless the authorization is terminated or revoked.     Resp Syncytial Virus by PCR NEGATIVE NEGATIVE    Comment: (NOTE) Fact Sheet for Patients: BloggerCourse.com  Fact Sheet for Healthcare Providers: SeriousBroker.it  This test is not yet approved or cleared by the [place] FDA and has been authorized for detection and/or diagnosis of SARS-CoV-2 by FDA under an Emergency Use Authorization (EUA). This EUA will remain in effect (meaning this test can be used) for the duration of the COVID-19 declaration under Section 564(b)(1)  of the Act, 21 U.S.C. section 360bbb-3(b)(1), unless the authorization is terminated or revoked.  Performed at [facility], [address]  [place], [place] [address]   Troponin I (High Sensitivity)     Status: None   Collection Time: 08/27/22  9:05 AM  Result Value Ref Range   Troponin I (High Sensitivity) 12 <18 ng/L    Comment: (NOTE) Elevated high sensitivity troponin I (hsTnI) values and significant  changes across serial measurements may suggest ACS but many other  chronic and acute conditions are known to elevate hsTnI results.  Refer to the "Links" section for chest pain algorithms and additional  guidance. Performed at [facility], [address] [place], [place] [address]   I-Stat CG4 Lactic Acid     Status: Abnormal   Collection Time: 08/27/22  1:01 PM  Result Value Ref Range   Lactic Acid, Venous 2.5 (HH) 0.5 - 1.9 mmol/L   Comment NOTIFIED PHYSICIAN   I-Stat CG4 Lactic Acid     Status: Abnormal   Collection Time: 08/27/22  3:39 PM  Result Value Ref Range   Lactic Acid, Venous 2.1 (HH) 0.5 - 1.9 mmol/L   Comment NOTIFIED PHYSICIAN   Protime-INR     Status: None   Collection Time: 08/27/22  4:35 PM  Result Value Ref Range   Prothrombin Time 14.5 11.4 - 15.2 seconds   INR 1.1 0.8 - 1.2    Comment: (NOTE) INR goal varies based on device and disease states. Performed at [facility], [address], [place], [place] [address]   APTT     Status: None   Collection Time: 08/27/22  4:35 PM  Result Value Ref Range   aPTT 35 24 - 36 seconds    Comment: Performed at [facility], [address] [place], [place] [address]  I-Stat CG4 Lactic Acid     Status: None   Collection Time: 08/27/22  4:52 PM  Result Value Ref Range   Lactic Acid, Venous 1.4 0.5 - 1.9 mmol/L   CT Head Wo Contrast  Result Date: 08/27/2022 CLINICAL DATA:  Head trauma, minor, normal mental status (Age 81-64y) EXAM: CT HEAD WITHOUT CONTRAST TECHNIQUE: Contiguous axial images were obtained from the base of the skull through the vertex without intravenous contrast. RADIATION DOSE REDUCTION: This exam was performed according  to the departmental dose-optimization program which includes automated exposure control, adjustment of the mA and/or kV according to patient size and/or use of iterative reconstruction technique. COMPARISON:  02/16/2016 FINDINGS: Brain: No evidence of acute infarction, hemorrhage, hydrocephalus, extra-axial collection or mass lesion/mass effect. Incidental note of a small amount of subependymal gray matter heterotopia. Patchy low-density changes within the periventricular and subcortical white matter most compatible with chronic microvascular ischemic change. Mild diffuse cerebral volume loss. Vascular: No hyperdense vessel or unexpected calcification. Skull: Normal. Negative for fracture or focal lesion. Sinuses/Orbits: No acute finding. Other: None. IMPRESSION: 1. No acute intracranial findings. 2. Chronic microvascular ischemic change and cerebral volume loss. Electronically Signed   By: Duanne Guess D.O.   On: 08/27/2022 15:10   DG Hand 2 View Right  Result Date: 08/27/2022 CLINICAL DATA:  86 year old female with history of trauma from a fall. Right-sided hand pain. EXAM: RIGHT HAND - 2 VIEW COMPARISON:  Right hand radiograph 04/27/2017. FINDINGS: Two views of the right hand demonstrate no acute displaced fracture or dislocation. Multifocal joint space narrowing, subchondral sclerosis, subchondral cyst formation and osteophyte formation, most severe  in the DIP and PIP joints, as well as the first [facility] joint, compatible with osteoarthritis. IMPRESSION: 1. No acute radiographic abnormality of the right hand. 2. Degenerative changes of osteoarthritis redemonstrated, as above. Electronically Signed   By: Trudie Reed M.D.   On: 08/27/2022 08:10   DG Knee 2 Views Left  Result Date: 08/27/2022 CLINICAL DATA:  87 year old female with history of trauma from a fall complaining of left knee pain. EXAM: LEFT KNEE - 1-2 VIEW COMPARISON:  No priors. FINDINGS: Two views of the left knee demonstrate no acute  displaced fracture or dislocation. There is joint space narrowing, subchondral sclerosis, subchondral cyst formation and osteophyte formation in a tricompartmental distribution, most severe in the medial and patellofemoral compartments, compatible with osteoarthritis. Soft tissues are grossly unremarkable. IMPRESSION: 1. No acute radiographic abnormality of the left knee. 2. Tricompartmental osteoarthritis, most severe in the medial and patellofemoral compartments. Electronically Signed   By: Trudie Reed M.D.   On: 08/27/2022 08:05   DG Knee Complete 4 Views Right  Result Date: 08/27/2022 CLINICAL DATA:  87 year old female with history of trauma from a fall. Right-sided knee pain. EXAM: RIGHT KNEE - COMPLETE 4+ VIEW COMPARISON:  None Available. FINDINGS: Four views of the right knee demonstrate postoperative changes of total knee arthroplasty. The femoral and tibial component of the prosthesis appear well seated without periprosthetic fracture or other acute abnormalities. Soft tissues are unremarkable in appearance. IMPRESSION: 1. No acute radiographic abnormality of the right knee. 2. Status post total knee arthroplasty. Electronically Signed   By: Trudie Reed M.D.   On: 08/27/2022 08:04   DG Chest 2 View  Result Date: 08/27/2022 CLINICAL DATA:  87 year old female status post fall. EXAM: CHEST - 2 VIEW COMPARISON:  Portable chest 06/06/2018 and earlier. FINDINGS: Semi upright AP and lateral views of the chest at 0722 hours. Sequelae of TAVR. Lung volumes and mediastinal contours are stable. Visualized tracheal air column is within normal limits. No pneumothorax, pulmonary edema, pleural effusion or confluent lung opacity. Osteopenia. No acute osseous abnormality identified. Paucity of bowel gas. IMPRESSION: No acute cardiopulmonary abnormality. Electronically Signed   By: Odessa Fleming M.D.   On: 08/27/2022 08:00    Pending Labs Unresulted Labs (From admission, onward)     Start     Ordered    08/27/22 1606  Culture, blood (Routine X 2) w Reflex to ID Panel  BLOOD CULTURE X 2,   R (with STAT occurrences)      08/27/22 1605   Signed and Held  Comprehensive metabolic panel  Tomorrow morning,   R        Signed and Held   Signed and Held  CBC  Tomorrow morning,   R        Signed and Held            Vitals/Pain Today's Vitals   08/27/22 1740 08/27/22 1800 08/27/22 1830 08/27/22 1919  BP:  (!) 122/54 (!) 106/56   Pulse:  85 88   Resp:  (!) 23 20   Temp: 98.4 F (36.9 C)     TempSrc: Oral     SpO2:  92% 95%   Weight:      Height:      PainSc:    0-No pain    Isolation Precautions No active isolations  Medications Medications  lactated ringers infusion (150 mL/hr Intravenous New Bag/Given 08/27/22 1630)  metroNIDAZOLE (FLAGYL) IVPB 500 mg (0 mg Intravenous Stopped 08/27/22 1851)  vancomycin (VANCOREADY) IVPB  2000 mg/400 mL (has no administration in time range)    Followed by  vancomycin (VANCOREADY) IVPB 1750 mg/350 mL (has no administration in time range)  ceFEPIme (MAXIPIME) 2 g in sodium chloride 0.9 % 100 mL IVPB (0 g Intravenous Stopped 08/27/22 1744)  lactated ringers bolus 500 mL (0 mLs Intravenous Stopped 08/27/22 0954)  oxyCODONE (Oxy IR/ROXICODONE) immediate release tablet 5 mg (5 mg Oral Given 08/27/22 1230)  lactated ringers bolus 1,000 mL (1,000 mLs Intravenous Bolus 08/27/22 1229)    Mobility walks with person assist     Focused Assessments Neuro Assessment Handoff:  Swallow screen pass? Yes          Neuro Assessment:   Neuro Checks:      Has TPA been given? No If patient is a Neuro Trauma and patient is going to OR before floor call report to 4N Charge nurse: [phone] or [phone]   R Recommendations: See Admitting Provider Note  Report given to:   Additional Notes:

## 2022-08-27 NOTE — H&P (Signed)
TRH H&P   Patient Demographics:    Anna Gamble, is a 87 y.o. female  MRN: [id number]   DOB - January 04, 1936  Admit Date - 08/27/2022  Outpatient Primary MD for the patient is [name], Neta Mends, MD  Referring MD/NP/PA: Dr Berna Bue  Patient coming from: home  Chief Complaint  Patient presents with   Fall      HPI:    Anna Gamble  is a 87 y.o. 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, ambulates with a walker and mainly wheelchair at home, she presents to ED secondary to progressive weakness, fall, she appears to be confused, unable to provide reliable history, so it was obtained with the husband at bedside, per husband patient has not been feeling right for the last 24 hours, poor appetite yesterday, more sleepy and drowsy, appears to be more confused, this morning she was going to the bathroom, where she had a fall, where she fell forward, into her knees bilaterally, she did not strike her head, she is not on any blood thinners, there is no loss of consciousness, no focal deficits, denies dysuria, cough, she denies fever but she reports some chills. -In ED lactic acid elevated at 2.5, improved to 2.1 after 2 L of fluids, UA is negative, she denies any urinary symptoms, chest x-ray with no acute cardiopulmonary finding, CT head with no acute intracranial findings, x-rays of bilateral knees, and right hand significant for severe osteoarthritis, given her SIRS finding, and encephalopathic, [facility] consulted to admit.    Review of systems:     A full 10 point Review of Systems was done, except as stated above, all other Review of Systems were negative.   With Past History of the following :    Past Medical History:   Diagnosis Date   CAD (coronary artery disease) 03/19/2014   Colon polyp    Coronary artery disease    GERD (gastroesophageal reflux disease)    Hx of colonoscopy with polypectomy 07/14/2012   medoff    Hyperlipidemia    Hypertension    Impaired fasting glucose    Morbid obesity (HCC)    S/P TAVR (transcatheter aortic valve replacement)    Severe aortic stenosis       Past Surgical History:  Procedure Laterality Date   APPENDECTOMY     CHOLECYSTECTOMY     FOOT SURGERY     rt   KNEE SURGERY     rt   LEFT AND RIGHT HEART CATHETERIZATION WITH CORONARY ANGIOGRAM N/A 10/10/2013   Procedure: LEFT AND RIGHT HEART CATHETERIZATION WITH CORONARY ANGIOGRAM;  Surgeon: Kathleene Hazel, MD;  Location: [facility] CATH LAB;  Service: Cardiovascular;  Laterality: N/A;   RIGHT/LEFT HEART CATH AND CORONARY ANGIOGRAPHY N/A 04/06/2018   Procedure: RIGHT/LEFT HEART CATH AND  CORONARY ANGIOGRAPHY;  Surgeon: Kathleene Hazel, MD;  Location: [facility] INVASIVE CV LAB;  Service: Cardiovascular;  Laterality: N/A;   TEE WITHOUT CARDIOVERSION N/A 06/06/2018   Procedure: TRANSESOPHAGEAL ECHOCARDIOGRAM (TEE);  Surgeon: Kathleene Hazel, MD;  Location: [facility] OR;  Service: Open Heart Surgery;  Laterality: N/A;   TONSILLECTOMY     TOTAL KNEE ARTHROPLASTY  02/01/2011   Procedure: TOTAL KNEE ARTHROPLASTY;  Surgeon: Nestor Lewandowsky;  Location: [facility] OR;  Service: Orthopedics;  Laterality: Right;  DEPUY/SIGMA   TRANSCATHETER AORTIC VALVE REPLACEMENT, TRANSFEMORAL N/A 06/06/2018   Procedure: TRANSCATHETER AORTIC VALVE REPLACEMENT, TRANSFEMORAL;  Surgeon: Kathleene Hazel, MD;  Location: [facility] OR;  Service: Open Heart Surgery;  Laterality: N/A;      Social History:     Social History   Tobacco Use   Smoking status: Never   Smokeless tobacco: Never  Substance Use Topics   Alcohol use: No     Lives -with her husband at home, married for 66 years  Mobility -ambulates with a walker, and wheelchair    Family  History :     Family History  Problem Relation Age of Onset   Coronary artery disease Other        female- 1st degree relative   Coronary artery disease Other        female- 1st degree relative   Hypertension Other    Hyperlipidemia Other    Heart attack Mother        age 79's   Heart attack Father        age 31's   Stroke Brother        died    Hearing loss Son    Sleep apnea Son    Aortic stenosis Sister    Stroke Brother    Heart attack Brother    Rheum arthritis Sister    Osteoarthritis Sister    Arthritis Sister    Heart Problems Brother      Home Medications:   Prior to Admission medications   Medication Sig Start Date End Date Taking? Authorizing Provider  amoxicillin (AMOXIL) 500 MG capsule Take 2,000 mg by mouth See admin instructions. 2000 mg prior to dentist appointment 08/25/22  Yes [provider]  aspirin 81 MG tablet Take 81 mg by mouth daily.   Yes [provider]  hydrochlorothiazide (MICROZIDE) 12.5 MG capsule TAKE 1 CAPSULE BY MOUTH DAILY 04/16/22  Yes Crenshaw, Madolyn Frieze, MD  losartan (COZAAR) 25 MG tablet TAKE 2 TABLETS BY MOUTH DAILY Patient taking differently: Take 50 mg by mouth daily. 04/16/22  Yes Lewayne Bunting, MD  metFORMIN (GLUCOPHAGE) 500 MG tablet TAKE 1 TABLET BY MOUTH DAILY Patient taking differently: Take 500 mg by mouth in the morning and at bedtime. 03/04/22  Yes [name], Neta Mends, MD  metoprolol succinate (TOPROL-XL) 50 MG 24 hr tablet TAKE 1 TABLET BY MOUTH DAILY 08/13/22  Yes Crenshaw, Madolyn Frieze, MD  Misc Natural Products (TART CHERRY ADVANCED PO) Take 1 capsule by mouth daily.    Yes [provider]  Multiple Vitamins-Minerals (MULTIVITAMIN WITH MINERALS) tablet Take 1 tablet by mouth daily.   Yes [provider]  Multiple Vitamins-Minerals (PRESERVISION AREDS 2+MULTI VIT PO) Take 1 tablet by mouth daily.   Yes [provider]  Polyethyl Glycol-Propyl Glycol (SYSTANE OP) Place 2 drops into both eyes  in the morning, at noon, and at bedtime.   Yes [provider]  Lancets (ACCU-CHEK SOFT TOUCH) lancets Use as instructed 11/22/18  [name], Neta Mends, MD  rosuvastatin (CRESTOR) 5 MG tablet Take 1 tablet (5 mg total) by mouth daily. Patient not taking: Reported on 08/27/2022 01/14/22   Eulis Foster, FNP  Semaglutide (OZEMPIC, 0.25 OR 0.5 MG/DOSE, [place]) Inject 0.25 mg into the skin once a week. Patient not taking: Reported on 08/03/2022    [provider]     Allergies:     Allergies  Allergen Reactions   Lisinopril Cough   Statins Other (See Comments)    muscle aches with some statins, tolerates low doses of crestor   Semaglutide Other (See Comments)    Took rybelsus tab. Cause her to have abdominal pain.      Physical Exam:   Vitals  Blood pressure 118/66, pulse 81, temperature 98.3 F (36.8 C), temperature source Oral, resp. rate 18, height 5\' 1"  (1.549 m), SpO2 95%.   1. General Frail, ill-appearing female, laying in bed, felt warm, upon checking temperature it was 99.3  2.  Patient is somnolent, open her eyes, answering some questions appropriately, but overall appears to be confused, slow to respond, not at baseline per husband at bedside  3. No F.N deficits, ALL C.Nerves Intact, Strength 5/5 all 4 extremities, Sensation intact all 4 extremities, Plantars down going.  4. Ears and Eyes appear Normal, Conjunctivae clear, PERRLA. Moist Oral Mucosa.  5. Supple Neck, No JVD, No cervical lymphadenopathy appriciated, No Carotid Bruits.  6. Symmetrical Chest wall movement, Good air movement bilaterally, CTAB.  7. RRR, No Gallops, Rubs or Murmurs, No Parasternal Heave.  8. Positive Bowel Sounds, Abdomen Soft, No tenderness, No organomegaly appriciated,No rebound -guarding or rigidity.  9.  No Cyanosis, Normal Skin Turgor, No Skin Rash or Bruise.  10. Good muscle tone,  joints appear normal , no effusions, Normal ROM.     Data Review:    CBC Recent Labs   Lab 08/27/22 0651  WBC 12.0*  HGB 12.5  HCT 37.0  PLT 171  MCV 93.0  MCH 31.4  MCHC 33.8  RDW 13.3  LYMPHSABS 0.6*  MONOABS 0.3  EOSABS 0.0  BASOSABS 0.0   ------------------------------------------------------------------------------------------------------------------  Chemistries  Recent Labs  Lab 08/27/22 0651  NA 132*  K 3.7  CL 97*  CO2 24  GLUCOSE 210*  BUN 14  CREATININE 0.94  CALCIUM 8.6*  MG 1.8  AST 25  ALT 27  ALKPHOS 59  BILITOT 1.7*   ------------------------------------------------------------------------------------------------------------------ CrCl cannot be calculated (Unknown ideal weight.). ------------------------------------------------------------------------------------------------------------------ No results for input(s): "TSH", "T4TOTAL", "T3FREE", "THYROIDAB" in the last 72 hours.  Invalid input(s): "FREET3"  Coagulation profile No results for input(s): "INR", "PROTIME" in the last 168 hours. ------------------------------------------------------------------------------------------------------------------- No results for input(s): "DDIMER" in the last 72 hours. -------------------------------------------------------------------------------------------------------------------  Cardiac Enzymes No results for input(s): "CKMB", "TROPONINI", "MYOGLOBIN" in the last 168 hours.  Invalid input(s): "CK" ------------------------------------------------------------------------------------------------------------------    Component Value Date/Time   BNP 76.9 06/02/2018 1347     ---------------------------------------------------------------------------------------------------------------  Urinalysis    Component Value Date/Time   COLORURINE AMBER (A) 08/27/2022 0705   APPEARANCEUR CLEAR 08/27/2022 0705   LABSPEC 1.024 08/27/2022 0705   PHURINE 6.0 08/27/2022 0705   GLUCOSEU NEGATIVE 08/27/2022 0705   HGBUR NEGATIVE 08/27/2022  0705   HGBUR small 07/25/2007 1236   BILIRUBINUR NEGATIVE 08/27/2022 0705   BILIRUBINUR neg 09/10/2021 1317   KETONESUR 5 (A) 08/27/2022 0705   PROTEINUR 30 (A) 08/27/2022 0705   UROBILINOGEN negative (A) 09/10/2021 1317   UROBILINOGEN 1.0 01/28/2011 1318   NITRITE NEGATIVE 08/27/2022 0705  LEUKOCYTESUR NEGATIVE 08/27/2022 0705    ----------------------------------------------------------------------------------------------------------------   Imaging Results:    CT Head Wo Contrast  Result Date: 08/27/2022 CLINICAL DATA:  Head trauma, minor, normal mental status (Age 66-64y) EXAM: CT HEAD WITHOUT CONTRAST TECHNIQUE: Contiguous axial images were obtained from the base of the skull through the vertex without intravenous contrast. RADIATION DOSE REDUCTION: This exam was performed according to the departmental dose-optimization program which includes automated exposure control, adjustment of the mA and/or kV according to patient size and/or use of iterative reconstruction technique. COMPARISON:  02/16/2016 FINDINGS: Brain: No evidence of acute infarction, hemorrhage, hydrocephalus, extra-axial collection or mass lesion/mass effect. Incidental note of a small amount of subependymal gray matter heterotopia. Patchy low-density changes within the periventricular and subcortical white matter most compatible with chronic microvascular ischemic change. Mild diffuse cerebral volume loss. Vascular: No hyperdense vessel or unexpected calcification. Skull: Normal. Negative for fracture or focal lesion. Sinuses/Orbits: No acute finding. Other: None. IMPRESSION: 1. No acute intracranial findings. 2. Chronic microvascular ischemic change and cerebral volume loss. Electronically Signed   By: Duanne Guess D.O.   On: 08/27/2022 15:10   DG Hand 2 View Right  Result Date: 08/27/2022 CLINICAL DATA:  87 year old female with history of trauma from a fall. Right-sided hand pain. EXAM: RIGHT HAND - 2 VIEW COMPARISON:   Right hand radiograph 04/27/2017. FINDINGS: Two views of the right hand demonstrate no acute displaced fracture or dislocation. Multifocal joint space narrowing, subchondral sclerosis, subchondral cyst formation and osteophyte formation, most severe in the DIP and PIP joints, as well as the first [facility] joint, compatible with osteoarthritis. IMPRESSION: 1. No acute radiographic abnormality of the right hand. 2. Degenerative changes of osteoarthritis redemonstrated, as above. Electronically Signed   By: Trudie Reed M.D.   On: 08/27/2022 08:10   DG Knee 2 Views Left  Result Date: 08/27/2022 CLINICAL DATA:  87 year old female with history of trauma from a fall complaining of left knee pain. EXAM: LEFT KNEE - 1-2 VIEW COMPARISON:  No priors. FINDINGS: Two views of the left knee demonstrate no acute displaced fracture or dislocation. There is joint space narrowing, subchondral sclerosis, subchondral cyst formation and osteophyte formation in a tricompartmental distribution, most severe in the medial and patellofemoral compartments, compatible with osteoarthritis. Soft tissues are grossly unremarkable. IMPRESSION: 1. No acute radiographic abnormality of the left knee. 2. Tricompartmental osteoarthritis, most severe in the medial and patellofemoral compartments. Electronically Signed   By: Trudie Reed M.D.   On: 08/27/2022 08:05   DG Knee Complete 4 Views Right  Result Date: 08/27/2022 CLINICAL DATA:  87 year old female with history of trauma from a fall. Right-sided knee pain. EXAM: RIGHT KNEE - COMPLETE 4+ VIEW COMPARISON:  None Available. FINDINGS: Four views of the right knee demonstrate postoperative changes of total knee arthroplasty. The femoral and tibial component of the prosthesis appear well seated without periprosthetic fracture or other acute abnormalities. Soft tissues are unremarkable in appearance. IMPRESSION: 1. No acute radiographic abnormality of the right knee. 2. Status post total knee  arthroplasty. Electronically Signed   By: Trudie Reed M.D.   On: 08/27/2022 08:04   DG Chest 2 View  Result Date: 08/27/2022 CLINICAL DATA:  87 year old female status post fall. EXAM: CHEST - 2 VIEW COMPARISON:  Portable chest 06/06/2018 and earlier. FINDINGS: Semi upright AP and lateral views of the chest at 0722 hours. Sequelae of TAVR. Lung volumes and mediastinal contours are stable. Visualized tracheal air column is within normal limits. No pneumothorax, pulmonary edema, pleural  effusion or confluent lung opacity. Osteopenia. No acute osseous abnormality identified. Paucity of bowel gas. IMPRESSION: No acute cardiopulmonary abnormality. Electronically Signed   By: Odessa Fleming M.D.   On: 08/27/2022 08:00    EKG: PR interval 172 ms QRS duration 103 ms QT/QTcB 406/497 ms P-R-T axes 41 27 148 Sinus rhythm Abnormal R-wave progression, early transition Nonspecific repol abnormality, lateral leads No significant change since last tracing      Assessment/plan   Principal Problem:   SIRS (systemic inflammatory response syndrome) (HCC) Active Problems:   HLD (hyperlipidemia)   Essential hypertension   GERD (gastroesophageal reflux disease)   CAD (coronary artery disease)   Severe aortic valve stenosis   S/P TAVR (transcatheter aortic valve replacement)   Type 2 diabetes mellitus with hyperglycemia, without long-term current use of insulin (HCC)    SIRS -Patient presents with altered mental status, low-grade temperature 99.3, lactic acidosis and leukocytosis -So far no source of infection could be identified, negative UA, chest x-ray with no evidence of cardiopulmonary disease, she has negative meningeal signs -Will send blood cultures, then she will be started on broad-spectrum antibiotics for unidentified source, will keep on IV vancomycin, cefepime and Flagyl -COVID-19 test is negative  Acute metabolic encephalopathy -CT head with no acute findings -Most likely related to  infection -She has negative meningeal signs  Diabetes mellitus Will hold metformin specially with lactic acidosis -Will keep on insulin sliding scale  Hyperlipidemia -Her statin appears to be discontinued by her PCP,  Hypertension -Blood pressure soft, so we will hold losartan, hydrochlorothiazide and metoprolol for now  History of aortic stenosis status post TAVR -Will monitor on telemetry, follow-up blood cultures  Weakness/deconditioning -Will consult PT/OT  Obesity class 3 Body mass index is 42.41 kg/m.  GERD-continue with PPI      DVT Prophylaxis -  Lovenox   AM Labs Ordered, also please review Full Orders  Family Communication: Admission, patients condition and plan of care including tests being ordered have been discussed with the patient and husband* who indicate understanding and agree with the plan and Code Status.  Code Status full code  Likely DC to home  Condition GUARDED  Consults called: None  Admission status: Inpatient  Time spent in minutes : 70 minutes   Huey Bienenstock M.D on 08/27/2022 at 4:27 PM   [facility] - Office  [phone]

## 2022-08-27 NOTE — ED Notes (Addendum)
Staff attempted to walk patient. Patient did not want to get out of bed due to pain in knees and neck. She typically uses a wheelchair or upright walker at home.

## 2022-08-27 NOTE — Progress Notes (Signed)
Pharmacy Antibiotic Note  Anna Gamble is a 87 y.o. female for which pharmacy has been consulted for cefepime and vancomycin dosing for sepsis.  Patient with a history of CAD, HTN, HLD, severe aortic stenosis s/p TAVR, T2DM, obesity, severe arthritis. Patient presenting with weakness, fall, and confused.  SCr 0.94 WBC 12; LA 2.1>1.4; T 98.4; HR 88; RR 22 COVID neg / flu neg  Plan: Metronidazole per MD Cefepime 2g q12hr  Vancomycin 2000 mg once then 1750 mg q48hr (eAUC 532.2) unless change in renal function Monitor WBC, fever, renal function, cultures De-escalate when able Levels at steady state  Height: 5\' 1"  (154.9 cm) IBW/kg (Calculated) : 47.8  Temp (24hrs), Avg:98.4 F (36.9 C), Min:98.3 F (36.8 C), Max:98.4 F (36.9 C)  Recent Labs  Lab 08/27/22 0651 08/27/22 1301 08/27/22 1539  WBC 12.0*  --   --   CREATININE 0.94  --   --   LATICACIDVEN  --  2.5* 2.1*    CrCl cannot be calculated (Unknown ideal weight.).    Allergies  Allergen Reactions   Lisinopril Cough   Statins Other (See Comments)    muscle aches with some statins, tolerates low doses of crestor   Semaglutide Other (See Comments)    Took rybelsus tab. Cause her to have abdominal pain.    Microbiology results: Pending  Thank you for allowing pharmacy to be a part of this patient's care.  Delmar Landau, PharmD, BCPS 08/27/2022 4:28 PM ED Clinical Pharmacist -  [phone]

## 2022-08-27 NOTE — ED Provider Notes (Signed)
[place] EMERGENCY DEPARTMENT AT [facility] Provider Note   CSN: [id number] Arrival date & time: 08/27/22  3474     History  Chief Complaint  Patient presents with   Anna Gamble is a 87 y.o. female.   Fall   Patient is an 86yF w/ PMHx of CAD, HTN, HLD, severe aortic stenosis s/p TAVR, T2DM, and obesity, presenting today for a fall.  She states she was trying to stand up off the edge of the tub when she lost her balance and fell forward onto bilateral knees.  She did not strike her head.  She had no loss of consciousness. She is not on any blood thinners. She admits to lightheadedness, nausea, and increased weakness. She states she was not feeling well yesterday, and spent most of the day in the bed. She denies any chest pain or shortness of breath. She has had no known fevers or cough. She denies any abdominal pain, dysuria or hematuria. She admits to chronic diarrhea associated with her diabetes medications. She denies any focal weakness or numbness.     Home Medications Prior to Admission medications   Medication Sig Start Date End Date Taking? Authorizing Provider  amoxicillin (AMOXIL) 500 MG capsule Take 2,000 mg by mouth See admin instructions. 2000 mg prior to dentist appointment 08/25/22  Yes [provider]  aspirin 81 MG tablet Take 81 mg by mouth daily.   Yes [provider]  hydrochlorothiazide (MICROZIDE) 12.5 MG capsule TAKE 1 CAPSULE BY MOUTH DAILY 04/16/22  Yes Crenshaw, Madolyn Frieze, MD  losartan (COZAAR) 25 MG tablet TAKE 2 TABLETS BY MOUTH DAILY Patient taking differently: Take 50 mg by mouth daily. 04/16/22  Yes Lewayne Bunting, MD  metFORMIN (GLUCOPHAGE) 500 MG tablet TAKE 1 TABLET BY MOUTH DAILY Patient taking differently: Take 500 mg by mouth in the morning and at bedtime. 03/04/22  Yes [name], Neta Mends, MD  metoprolol succinate (TOPROL-XL) 50 MG 24 hr tablet TAKE 1 TABLET BY MOUTH DAILY 08/13/22  Yes Crenshaw, Madolyn Frieze, MD   Misc Natural Products (TART CHERRY ADVANCED PO) Take 1 capsule by mouth daily.    Yes [provider]  Multiple Vitamins-Minerals (MULTIVITAMIN WITH MINERALS) tablet Take 1 tablet by mouth daily.   Yes [provider]  Multiple Vitamins-Minerals (PRESERVISION AREDS 2+MULTI VIT PO) Take 1 tablet by mouth daily.   Yes [provider]  Polyethyl Glycol-Propyl Glycol (SYSTANE OP) Place 2 drops into both eyes in the morning, at noon, and at bedtime.   Yes [provider]  Lancets (ACCU-CHEK SOFT TOUCH) lancets Use as instructed 11/22/18   [name], Neta Mends, MD  rosuvastatin (CRESTOR) 5 MG tablet Take 1 tablet (5 mg total) by mouth daily. Patient not taking: Reported on 08/27/2022 01/14/22   Eulis Foster, FNP  Semaglutide (OZEMPIC, 0.25 OR 0.5 MG/DOSE, [place]) Inject 0.25 mg into the skin once a week. Patient not taking: Reported on 08/03/2022    [provider]      Allergies    Lisinopril, Statins, and Semaglutide    Review of Systems   Review of Systems Negative except for as noted above in the HPI  Physical Exam Updated Vital Signs BP 118/66   Pulse 81   Temp 98.3 F (36.8 C) (Oral)   Resp 18   Ht 5\' 1"  (1.549 m)   SpO2 95%   BMI 42.34 kg/m  Physical Exam Vitals and nursing note reviewed.  Constitutional:  General: She is not in acute distress.    Appearance: She is well-developed.  HENT:     Head: Normocephalic and atraumatic.     Mouth/Throat:     Mouth: Mucous membranes are dry.  Eyes:     Extraocular Movements: Extraocular movements intact.     Conjunctiva/sclera: Conjunctivae normal.     Pupils: Pupils are equal, round, and reactive to light.  Cardiovascular:     Rate and Rhythm: Normal rate and regular rhythm.     Heart sounds: Murmur (systolic murmur noted a the right sternal border) heard.  Pulmonary:     Effort: Pulmonary effort is normal. No respiratory distress.     Breath sounds: Normal breath sounds. No wheezing,  rhonchi or rales.     Comments: Saturating well on room air Abdominal:     General: There is no distension.     Palpations: Abdomen is soft.     Tenderness: There is no abdominal tenderness. There is no guarding or rebound.  Musculoskeletal:        General: Tenderness (right knee tenderness with small abrasion over the right anterolateral knee) present. No swelling.     Cervical back: Neck supple.  Skin:    General: Skin is warm and dry.     Capillary Refill: Capillary refill takes less than 2 seconds.     Findings: No rash.  Neurological:     Mental Status: She is alert and oriented to person, place, and time.     Cranial Nerves: No cranial nerve deficit.     Sensory: No sensory deficit.     Motor: No weakness.  Psychiatric:        Mood and Affect: Mood normal.     ED Results / Procedures / Treatments   Labs (all labs ordered are listed, but only abnormal results are displayed) Labs Reviewed  CBC WITH DIFFERENTIAL/PLATELET - Abnormal; Notable for the following components:      Result Value   WBC 12.0 (*)    Neutro Abs 11.0 (*)    Lymphs Abs 0.6 (*)    All other components within normal limits  COMPREHENSIVE METABOLIC PANEL - Abnormal; Notable for the following components:   Sodium 132 (*)    Chloride 97 (*)    Glucose, Bld 210 (*)    Calcium 8.6 (*)    Total Protein 6.2 (*)    Albumin 3.1 (*)    Total Bilirubin 1.7 (*)    GFR, Estimated 60 (*)    All other components within normal limits  URINALYSIS, W/ REFLEX TO CULTURE (INFECTION SUSPECTED) - Abnormal; Notable for the following components:   Color, Urine AMBER (*)    Ketones, ur 5 (*)    Protein, ur 30 (*)    Bacteria, UA RARE (*)    All other components within normal limits  I-STAT CG4 LACTIC ACID, ED - Abnormal; Notable for the following components:   Lactic Acid, Venous 2.5 (*)    All other components within normal limits  I-STAT CG4 LACTIC ACID, ED - Abnormal; Notable for the following components:   Lactic  Acid, Venous 2.1 (*)    All other components within normal limits  RESP PANEL BY RT-PCR (RSV, FLU A&B, COVID)  RVPGX2  MAGNESIUM  TROPONIN I (HIGH SENSITIVITY)  TROPONIN I (HIGH SENSITIVITY)    EKG EKG Interpretation Date/Time:  Friday August 27 2022 06:46:58 EDT Ventricular Rate:  90 PR Interval:  172 QRS Duration:  103 QT Interval:  406 QTC  Calculation: 497 R Axis:   27  Text Interpretation: Sinus rhythm Abnormal R-wave progression, early transition Nonspecific repol abnormality, lateral leads No significant change since last tracing Confirmed by Linwood Dibbles [phone]) on 08/27/2022 7:06:19 AM  Radiology CT Head Wo Contrast  Result Date: 08/27/2022 CLINICAL DATA:  Head trauma, minor, normal mental status (Age 43-64y) EXAM: CT HEAD WITHOUT CONTRAST TECHNIQUE: Contiguous axial images were obtained from the base of the skull through the vertex without intravenous contrast. RADIATION DOSE REDUCTION: This exam was performed according to the departmental dose-optimization program which includes automated exposure control, adjustment of the mA and/or kV according to patient size and/or use of iterative reconstruction technique. COMPARISON:  02/16/2016 FINDINGS: Brain: No evidence of acute infarction, hemorrhage, hydrocephalus, extra-axial collection or mass lesion/mass effect. Incidental note of a small amount of subependymal gray matter heterotopia. Patchy low-density changes within the periventricular and subcortical white matter most compatible with chronic microvascular ischemic change. Mild diffuse cerebral volume loss. Vascular: No hyperdense vessel or unexpected calcification. Skull: Normal. Negative for fracture or focal lesion. Sinuses/Orbits: No acute finding. Other: None. IMPRESSION: 1. No acute intracranial findings. 2. Chronic microvascular ischemic change and cerebral volume loss. Electronically Signed   By: Duanne Guess D.O.   On: 08/27/2022 15:10   DG Hand 2 View Right  Result  Date: 08/27/2022 CLINICAL DATA:  87 year old female with history of trauma from a fall. Right-sided hand pain. EXAM: RIGHT HAND - 2 VIEW COMPARISON:  Right hand radiograph 04/27/2017. FINDINGS: Two views of the right hand demonstrate no acute displaced fracture or dislocation. Multifocal joint space narrowing, subchondral sclerosis, subchondral cyst formation and osteophyte formation, most severe in the DIP and PIP joints, as well as the first [facility] joint, compatible with osteoarthritis. IMPRESSION: 1. No acute radiographic abnormality of the right hand. 2. Degenerative changes of osteoarthritis redemonstrated, as above. Electronically Signed   By: Trudie Reed M.D.   On: 08/27/2022 08:10   DG Knee 2 Views Left  Result Date: 08/27/2022 CLINICAL DATA:  87 year old female with history of trauma from a fall complaining of left knee pain. EXAM: LEFT KNEE - 1-2 VIEW COMPARISON:  No priors. FINDINGS: Two views of the left knee demonstrate no acute displaced fracture or dislocation. There is joint space narrowing, subchondral sclerosis, subchondral cyst formation and osteophyte formation in a tricompartmental distribution, most severe in the medial and patellofemoral compartments, compatible with osteoarthritis. Soft tissues are grossly unremarkable. IMPRESSION: 1. No acute radiographic abnormality of the left knee. 2. Tricompartmental osteoarthritis, most severe in the medial and patellofemoral compartments. Electronically Signed   By: Trudie Reed M.D.   On: 08/27/2022 08:05   DG Knee Complete 4 Views Right  Result Date: 08/27/2022 CLINICAL DATA:  87 year old female with history of trauma from a fall. Right-sided knee pain. EXAM: RIGHT KNEE - COMPLETE 4+ VIEW COMPARISON:  None Available. FINDINGS: Four views of the right knee demonstrate postoperative changes of total knee arthroplasty. The femoral and tibial component of the prosthesis appear well seated without periprosthetic fracture or other acute  abnormalities. Soft tissues are unremarkable in appearance. IMPRESSION: 1. No acute radiographic abnormality of the right knee. 2. Status post total knee arthroplasty. Electronically Signed   By: Trudie Reed M.D.   On: 08/27/2022 08:04   DG Chest 2 View  Result Date: 08/27/2022 CLINICAL DATA:  87 year old female status post fall. EXAM: CHEST - 2 VIEW COMPARISON:  Portable chest 06/06/2018 and earlier. FINDINGS: Semi upright AP and lateral views of the chest at [phone]  hours. Sequelae of TAVR. Lung volumes and mediastinal contours are stable. Visualized tracheal air column is within normal limits. No pneumothorax, pulmonary edema, pleural effusion or confluent lung opacity. Osteopenia. No acute osseous abnormality identified. Paucity of bowel gas. IMPRESSION: No acute cardiopulmonary abnormality. Electronically Signed   By: Odessa Fleming M.D.   On: 08/27/2022 08:00      Medications Ordered in ED Medications  cefTRIAXone (ROCEPHIN) 1 g in sodium chloride 0.9 % 100 mL IVPB (has no administration in time range)  lactated ringers bolus 500 mL (0 mLs Intravenous Stopped 08/27/22 0954)  oxyCODONE (Oxy IR/ROXICODONE) immediate release tablet 5 mg (5 mg Oral Given 08/27/22 1230)  lactated ringers bolus 1,000 mL (1,000 mLs Intravenous Bolus 08/27/22 1229)    ED Course/ Medical Decision Making/ A&P Clinical Course as of 08/27/22 1602  Fri Aug 27, 2022  1517 St. Here after GLF. Struck hand and knees. Plain films negative but too weak to ambulate. W/u largely unremarkable. SBPs were soft but back up after fluids. UA w some bacteria. Lactic elevated, leukocytosis. Getting Rocephin, admit. [WL]    Clinical Course User Index [WL] Dyanne Iha, MD                                Medical Decision Making Problems Addressed: Weakness: complicated acute illness or injury  Amount and/or Complexity of Data Reviewed External Data Reviewed: notes. Labs: ordered. Radiology: ordered and independent interpretation  performed. ECG/medicine tests: ordered and independent interpretation performed.  Risk Prescription drug management. Decision regarding hospitalization.    Patient is an 86yF w/ PMHx of CAD, HTN, HLD, severe aortic stenosis s/p TAVR, T2DM, and obesity, presenting today for a fall. On exam, patient is alert and oriented, RRR, lungs are CTA. Abdominal exam is benign. She does have dry mucous membranes. Neurologic exam is unremarkable.  Regarding the fall, will evaluate for injury to bilateral knees as well as right hand where she has pain to her 4th digit. Do not have strong concern for intracranial hemorrhage without any trauma to her head and not on blood thinners without any focal neurologic deficit. As for her weakness, will evaluate for electrolyte derangement, anemia, arrhythmia, acs, uti, and pneumonia.   Patient initially started 500 cc fluid bolus.  EKG reveals sinus rhythm at 90 bpm without any acute ST or T wave abnormalities.  X-ray of the right left knee as well as the right hand were obtained to evaluate for an injury.  X-rays were negative.  Additionally obtain chest x-ray given her generalized weakness.  No acute findings were noted.  Lab work performed Mild hyponatremia at 132, increased total bilirubin at 1.6.  She is leukocytosis at 12.  UA shows rare bacteria with 5 ketones.  Troponin is negative.  COVID/flu swab is negative.    Patient was noted to have decreasing blood pressures with systolic in the 90s.  Given additional 1 L IV fluids.  Lactic acid ordered, which was 2.5.  Attempted to ambulate the patient, however patient is unable to roll in the bed due to significantly increased weakness.  CT head obtained to further evaluate which is negative for any acute abnormalities.  Will discuss admission with hospitalist team for abrupt increase in generalized weakness with concern for sepsis given elevated lactic acid, leukocytosis, and hypotension.  Source remains unknown.   She is hemodynamically stable at this time.    Final Clinical Impression(s) / ED Diagnoses Final diagnoses:  Weakness    Rx / DC Orders ED Discharge Orders     None         Rhys Martini, DO 08/27/22 1602    Linwood Dibbles, MD 08/28/22 [phone]

## 2022-08-27 NOTE — ED Triage Notes (Signed)
Pt arrives to ED c/o bilateral knee pain post mechanical fall when trying to sit down on chair at home. Pt denies LOC/headstrike/ or use of thinners. Pt endorses feeling weak last several days, denies fever

## 2022-08-28 DIAGNOSIS — R6510 Systemic inflammatory response syndrome (SIRS) of non-infectious origin without acute organ dysfunction: Secondary | ICD-10-CM | POA: Diagnosis not present

## 2022-08-28 DIAGNOSIS — I10 Essential (primary) hypertension: Secondary | ICD-10-CM | POA: Diagnosis not present

## 2022-08-28 LAB — CBC
HCT: 37.2 % (ref 36.0–46.0)
Hemoglobin: 12.8 g/dL (ref 12.0–15.0)
MCH: 32.7 pg (ref 26.0–34.0)
MCHC: 34.4 g/dL (ref 30.0–36.0)
MCV: 94.9 fL (ref 80.0–100.0)
Platelets: 164 10*3/uL (ref 150–400)
RBC: 3.92 MIL/uL (ref 3.87–5.11)
RDW: 13.2 % (ref 11.5–15.5)
WBC: 8.2 10*3/uL (ref 4.0–10.5)
nRBC: 0 % (ref 0.0–0.2)

## 2022-08-28 LAB — COMPREHENSIVE METABOLIC PANEL
ALT: 28 U/L (ref 0–44)
AST: 30 U/L (ref 15–41)
Albumin: 2.8 g/dL — ABNORMAL LOW (ref 3.5–5.0)
Alkaline Phosphatase: 60 U/L (ref 38–126)
Anion gap: 11 (ref 5–15)
BUN: 11 mg/dL (ref 8–23)
CO2: 23 mmol/L (ref 22–32)
Calcium: 8.4 mg/dL — ABNORMAL LOW (ref 8.9–10.3)
Chloride: 102 mmol/L (ref 98–111)
Creatinine, Ser: 0.83 mg/dL (ref 0.44–1.00)
GFR, Estimated: >60 mL/min (ref >60)
Glucose, Bld: 117 mg/dL — ABNORMAL HIGH (ref 70–99)
Potassium: 3.7 mmol/L (ref 3.5–5.1)
Sodium: 136 mmol/L (ref 135–145)
Total Bilirubin: 1.1 mg/dL (ref 0.3–1.2)
Total Protein: 6 g/dL — ABNORMAL LOW (ref 6.5–8.1)

## 2022-08-28 LAB — GLUCOSE, CAPILLARY
Glucose-Capillary: 116 mg/dL — ABNORMAL HIGH (ref 70–99)
Glucose-Capillary: 145 mg/dL — ABNORMAL HIGH (ref 70–99)
Glucose-Capillary: 100 mg/dL — ABNORMAL HIGH (ref 70–99)
Glucose-Capillary: 121 mg/dL — ABNORMAL HIGH (ref 70–99)

## 2022-08-28 LAB — MRSA NEXT GEN BY PCR, NASAL: MRSA by PCR Next Gen: NOT DETECTED

## 2022-08-28 MED ORDER — INSULIN ASPART 100 UNIT/ML IJ SOLN: 0.0000 [IU] | Freq: Every day | INTRAMUSCULAR | Status: DC

## 2022-08-28 MED ORDER — ACETAMINOPHEN 650 MG RE SUPP: 650.0000 mg | Freq: Four times a day (QID) | RECTAL | Status: DC | PRN

## 2022-08-28 MED ORDER — ALBUTEROL SULFATE (2.5 MG/3ML) 0.083% IN NEBU: 2.5000 mg | INHALATION_SOLUTION | RESPIRATORY_TRACT | Status: DC | PRN

## 2022-08-28 MED ORDER — ACETAMINOPHEN 325 MG PO TABS
650.0000 mg | ORAL_TABLET | Freq: Four times a day (QID) | ORAL | Status: DC | PRN
  Administered 2022-08-30 (×2): 650 mg via ORAL
  Filled 2022-08-28 (×2): qty 2

## 2022-08-28 MED ORDER — HYDRALAZINE HCL 20 MG/ML IJ SOLN: 5.0000 mg | INTRAMUSCULAR | Status: DC | PRN

## 2022-08-28 MED ORDER — ENOXAPARIN SODIUM 60 MG/0.6ML IJ SOSY
50.0000 mg | PREFILLED_SYRINGE | INTRAMUSCULAR | Status: DC
  Administered 2022-08-28 – 2022-08-31 (×4): 50 mg via SUBCUTANEOUS
  Filled 2022-08-28 (×4): qty 0.6

## 2022-08-28 MED ORDER — INSULIN ASPART 100 UNIT/ML IJ SOLN
0.0000 [IU] | Freq: Three times a day (TID) | INTRAMUSCULAR | Status: DC
  Administered 2022-08-28: 2 [IU] via SUBCUTANEOUS
  Administered 2022-08-29: 3 [IU] via SUBCUTANEOUS
  Administered 2022-08-29: 1 [IU] via SUBCUTANEOUS
  Administered 2022-08-30 (×2): 2 [IU] via SUBCUTANEOUS
  Administered 2022-08-31: 3 [IU] via SUBCUTANEOUS

## 2022-08-28 MED ORDER — ASPIRIN 81 MG PO TBEC
81.0000 mg | DELAYED_RELEASE_TABLET | Freq: Every day | ORAL | Status: DC
  Administered 2022-08-28 – 2022-08-31 (×4): 81 mg via ORAL
  Filled 2022-08-28 (×4): qty 1

## 2022-08-28 NOTE — Plan of Care (Signed)

## 2022-08-28 NOTE — Progress Notes (Signed)
PROGRESS NOTE    Anna Gamble  ZOX:[id number] DOB: 1935/01/29 DOA: 08/27/2022 PCP: [name], MD (Confirm with patient/family/NH records and if not entered, this HAS to be entered at [facility] point of entry. "No PCP" if truly none.)   Chief Complaint  Patient presents with   Fall    Brief Narrative:   Anna Gamble  is a 87 y.o. 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, ambulates with a walker and mainly wheelchair at home, she presents to ED secondary to progressive weakness, fall, she appears to be confused, unable to provide reliable history, so it was obtained with the husband at bedside, per husband patient has not been feeling right for the last 24 hours, poor appetite yesterday, more sleepy and drowsy, appears to be more confused, this morning she was going to the bathroom, where she had a fall, where she fell forward, into her knees bilaterally, she did not strike her head, she is not on any blood thinners, there is no loss of consciousness, no focal deficits, denies dysuria, cough, she denies fever but she reports some chills. -In ED lactic acid elevated at 2.5, improved to 2.1 after 2 L of fluids, UA is negative, she denies any urinary symptoms, chest x-ray with no acute cardiopulmonary finding, CT head with no acute intracranial findings, x-rays of bilateral knees, and right hand significant for severe osteoarthritis, given her SIRS finding, and encephalopathic   Assessment & Plan:   Principal Problem:   SIRS (systemic inflammatory response syndrome) (HCC) Active Problems:   HLD (hyperlipidemia)   Essential hypertension   GERD (gastroesophageal reflux disease)   CAD (coronary artery disease)   Severe aortic valve stenosis   S/P TAVR (transcatheter aortic valve replacement)   Type 2 diabetes mellitus with  hyperglycemia, without long-term current use of insulin (HCC)      SIRS -Patient presents with altered mental status, low-grade temperature 99.3, lactic acidosis and leukocytosis -She was treated with broad-spectrum antibiotic overnight, vancomycin, cefepime and Flagyl, will furfural on MRSA PCR, and if negative will DC vancomycin  - so far no source of infection could be identified, negative UA, chest x-ray with no evidence of cardiopulmonary disease, she has negative meningeal signs -Follow-up blood cultures -COVID-19 test is negative   Acute metabolic encephalopathy -CT head with no acute findings -Most likely related to infection -She has negative meningeal signs -Patient much improved this morning   Diabetes mellitus Will hold metformin specially with lactic acidosis -Will keep on insulin sliding scale   Hyperlipidemia -Her statin appears to be discontinued by her PCP,   Hypertension -Blood pressure soft, so we will hold losartan, hydrochlorothiazide and metoprolol for now   History of aortic stenosis status post TAVR -Will monitor on telemetry, follow-up blood cultures   Weakness/deconditioning - PT/OT   Obesity class 3 Body mass index is 42.41 kg/m.   GERD-continue with PPI     DVT prophylaxis: Lovenox Code Status: Full Family Communication: Discussed with husband and son at bedside Disposition:   Status is: Inpatient    Consultants:  None Subjective:  Significant events overnight, patient reports good night sleep.  Objective: Vitals:   08/28/22 0429 08/28/22 0716 08/28/22 0800 08/28/22 1139  BP: (!) 118/53 (!) 123/50 (!) 115/53 120/69  Pulse: 85 81 88 86  Resp: 18 19 18  (!) 24  Temp: 98 F (36.7 C) 97.8 F (36.6 C)  97.8 F (36.6 C) 99.4 F (37.4 C)  TempSrc: Oral Oral Oral Oral  SpO2: 93% 100% 99%   Weight:      Height:        Intake/Output Summary (Last 24 hours) at 08/28/2022 1413 Last data filed at 08/28/2022 1010 Gross per 24 hour   Intake 200 ml  Output 450 ml  Net -250 ml   Filed Weights   08/27/22 1629  Weight: 101.8 kg    Examination:  Awake Alert, Oriented X 3, much more appropriate and coherent today Symmetrical Chest wall movement, Good air movement bilaterally, CTAB RRR,No Gallops,Rubs or new Murmurs, No Parasternal Heave +ve B.Sounds, Abd Soft, No tenderness, No rebound - guarding or rigidity. No Cyanosis, Clubbing or edema, No new Rash or bruise      Data Reviewed: I have personally reviewed following labs and imaging studies  CBC: Recent Labs  Lab 08/27/22 0651 08/28/22 0857  WBC 12.0* 8.2  NEUTROABS 11.0*  --   HGB 12.5 12.8  HCT 37.0 37.2  MCV 93.0 94.9  PLT 171 164    Basic Metabolic Panel: Recent Labs  Lab 08/27/22 0651 08/28/22 0857  NA 132* 136  K 3.7 3.7  CL 97* 102  CO2 24 23  GLUCOSE 210* 117*  BUN 14 11  CREATININE 0.94 0.83  CALCIUM 8.6* 8.4*  MG 1.8  --     GFR: Estimated Creatinine Clearance: 55.3 mL/min (by C-G formula based on SCr of 0.83 mg/dL).  Liver Function Tests: Recent Labs  Lab 08/27/22 0651 08/28/22 0857  AST 25 30  ALT 27 28  ALKPHOS 59 60  BILITOT 1.7* 1.1  PROT 6.2* 6.0*  ALBUMIN 3.1* 2.8*    CBG: Recent Labs  Lab 08/28/22 0751 08/28/22 1141  GLUCAP 116* 145*     Recent Results (from the past 240 hour(s))  Resp panel by RT-PCR (RSV, Flu A&B, Covid) Anterior Nasal Swab     Status: None   Collection Time: 08/27/22  7:55 AM   Specimen: Anterior Nasal Swab  Result Value Ref Range Status   SARS Coronavirus 2 by RT PCR NEGATIVE NEGATIVE Final   Influenza A by PCR NEGATIVE NEGATIVE Final   Influenza B by PCR NEGATIVE NEGATIVE Final    Comment: (NOTE) The Xpert Xpress SARS-CoV-2/FLU/RSV plus assay is intended as an aid in the diagnosis of influenza from Nasopharyngeal swab specimens and should not be used as a sole basis for treatment. Nasal washings and aspirates are unacceptable for Xpert Xpress  SARS-CoV-2/FLU/RSV testing.  Fact Sheet for Patients: BloggerCourse.com  Fact Sheet for Healthcare Providers: SeriousBroker.it  This test is not yet approved or cleared by the [place] FDA and has been authorized for detection and/or diagnosis of SARS-CoV-2 by FDA under an Emergency Use Authorization (EUA). This EUA will remain in effect (meaning this test can be used) for the duration of the COVID-19 declaration under Section 564(b)(1) of the Act, 21 U.S.C. section 360bbb-3(b)(1), unless the authorization is terminated or revoked.     Resp Syncytial Virus by PCR NEGATIVE NEGATIVE Final    Comment: (NOTE) Fact Sheet for Patients: BloggerCourse.com  Fact Sheet for Healthcare Providers: SeriousBroker.it  This test is not yet approved or cleared by the [place] FDA and has been authorized for detection and/or diagnosis of SARS-CoV-2 by FDA under an Emergency Use Authorization (EUA). This EUA will remain in effect (meaning this test can be used) for the duration of the COVID-19 declaration under Section 564(b)(1) of the Act,  21 U.S.C. section 360bbb-3(b)(1), unless the authorization is terminated or revoked.  Performed at [facility], [address], [place], [place] [address]   Culture, blood (Routine X 2) w Reflex to ID Panel     Status: None (Preliminary result)   Collection Time: 08/27/22  4:25 PM   Specimen: BLOOD RIGHT HAND  Result Value Ref Range Status   Specimen Description BLOOD RIGHT HAND  Final   Special Requests   Final    BOTTLES DRAWN AEROBIC AND ANAEROBIC Blood Culture results may not be optimal due to an inadequate volume of blood received in culture bottles   Culture   Final    NO GROWTH < 24 HOURS Performed at [facility], [address], [place], [place] [address]    Report Status PENDING  Incomplete  Culture, blood (Routine X 2) w  Reflex to ID Panel     Status: None (Preliminary result)   Collection Time: 08/27/22  4:35 PM   Specimen: BLOOD  Result Value Ref Range Status   Specimen Description BLOOD LEFT ANTECUBITAL  Final   Special Requests   Final    BOTTLES DRAWN AEROBIC AND ANAEROBIC Blood Culture results may not be optimal due to an inadequate volume of blood received in culture bottles   Culture   Final    NO GROWTH < 24 HOURS Performed at [facility], [address], [place], [place] [address]    Report Status PENDING  Incomplete         Radiology Studies: CT Head Wo Contrast  Result Date: 08/27/2022 CLINICAL DATA:  Head trauma, minor, normal mental status (Age 95-64y) EXAM: CT HEAD WITHOUT CONTRAST TECHNIQUE: Contiguous axial images were obtained from the base of the skull through the vertex without intravenous contrast. RADIATION DOSE REDUCTION: This exam was performed according to the departmental dose-optimization program which includes automated exposure control, adjustment of the mA and/or kV according to patient size and/or use of iterative reconstruction technique. COMPARISON:  02/16/2016 FINDINGS: Brain: No evidence of acute infarction, hemorrhage, hydrocephalus, extra-axial collection or mass lesion/mass effect. Incidental note of a small amount of subependymal gray matter heterotopia. Patchy low-density changes within the periventricular and subcortical white matter most compatible with chronic microvascular ischemic change. Mild diffuse cerebral volume loss. Vascular: No hyperdense vessel or unexpected calcification. Skull: Normal. Negative for fracture or focal lesion. Sinuses/Orbits: No acute finding. Other: None. IMPRESSION: 1. No acute intracranial findings. 2. Chronic microvascular ischemic change and cerebral volume loss. Electronically Signed   By: Duanne Guess D.O.   On: 08/27/2022 15:10   DG Hand 2 View Right  Result Date: 08/27/2022 CLINICAL DATA:  87 year old female with history  of trauma from a fall. Right-sided hand pain. EXAM: RIGHT HAND - 2 VIEW COMPARISON:  Right hand radiograph 04/27/2017. FINDINGS: Two views of the right hand demonstrate no acute displaced fracture or dislocation. Multifocal joint space narrowing, subchondral sclerosis, subchondral cyst formation and osteophyte formation, most severe in the DIP and PIP joints, as well as the first [facility] joint, compatible with osteoarthritis. IMPRESSION: 1. No acute radiographic abnormality of the right hand. 2. Degenerative changes of osteoarthritis redemonstrated, as above. Electronically Signed   By: Trudie Reed M.D.   On: 08/27/2022 08:10   DG Knee 2 Views Left  Result Date: 08/27/2022 CLINICAL DATA:  87 year old female with history of trauma from a fall complaining of left knee pain. EXAM: LEFT KNEE - 1-2 VIEW COMPARISON:  No priors. FINDINGS: Two views  of the left knee demonstrate no acute displaced fracture or dislocation. There is joint space narrowing, subchondral sclerosis, subchondral cyst formation and osteophyte formation in a tricompartmental distribution, most severe in the medial and patellofemoral compartments, compatible with osteoarthritis. Soft tissues are grossly unremarkable. IMPRESSION: 1. No acute radiographic abnormality of the left knee. 2. Tricompartmental osteoarthritis, most severe in the medial and patellofemoral compartments. Electronically Signed   By: Trudie Reed M.D.   On: 08/27/2022 08:05   DG Knee Complete 4 Views Right  Result Date: 08/27/2022 CLINICAL DATA:  87 year old female with history of trauma from a fall. Right-sided knee pain. EXAM: RIGHT KNEE - COMPLETE 4+ VIEW COMPARISON:  None Available. FINDINGS: Four views of the right knee demonstrate postoperative changes of total knee arthroplasty. The femoral and tibial component of the prosthesis appear well seated without periprosthetic fracture or other acute abnormalities. Soft tissues are unremarkable in appearance. IMPRESSION:  1. No acute radiographic abnormality of the right knee. 2. Status post total knee arthroplasty. Electronically Signed   By: Trudie Reed M.D.   On: 08/27/2022 08:04   DG Chest 2 View  Result Date: 08/27/2022 CLINICAL DATA:  87 year old female status post fall. EXAM: CHEST - 2 VIEW COMPARISON:  Portable chest 06/06/2018 and earlier. FINDINGS: Semi upright AP and lateral views of the chest at 0722 hours. Sequelae of TAVR. Lung volumes and mediastinal contours are stable. Visualized tracheal air column is within normal limits. No pneumothorax, pulmonary edema, pleural effusion or confluent lung opacity. Osteopenia. No acute osseous abnormality identified. Paucity of bowel gas. IMPRESSION: No acute cardiopulmonary abnormality. Electronically Signed   By: Odessa Fleming M.D.   On: 08/27/2022 08:00        Scheduled Meds:  aspirin EC  81 mg Oral Daily   enoxaparin (LOVENOX) injection  50 mg Subcutaneous Q24H   insulin aspart  0-15 Units Subcutaneous TID WC   insulin aspart  0-5 Units Subcutaneous QHS   Continuous Infusions:  ceFEPime (MAXIPIME) IV Stopped (08/28/22 0420)   metronidazole 500 mg (08/28/22 0429)   [START ON 08/29/2022] vancomycin       LOS: 1 day       Huey Bienenstock, MD [facility]   To contact the attending provider between 7A-7P or the covering provider during after hours 7P-7A, please log into the web site [email or web address] and access using universal [place] password for that web site. If you do not have the password, please call the hospital operator.  08/28/2022, 2:13 PM

## 2022-08-28 NOTE — Plan of Care (Signed)
  Problem: Fluid Volume: Goal: Hemodynamic stability will improve Outcome: Progressing   Problem: Clinical Measurements: Goal: Diagnostic test results will improve Outcome: Progressing Goal: Signs and symptoms of infection will decrease Outcome: Progressing   Problem: Respiratory: Goal: Ability to maintain adequate ventilation will improve Outcome: Progressing   Problem: Education: Goal: Ability to describe self-care measures that may prevent or decrease complications (Diabetes Survival Skills Education) will improve Outcome: Progressing Goal: Individualized Educational Video(s) Outcome: Progressing   Problem: Coping: Goal: Ability to adjust to condition or change in health will improve Outcome: Progressing   Problem: Fluid Volume: Goal: Ability to maintain a balanced intake and output will improve Outcome: Progressing   Problem: Health Behavior/Discharge Planning: Goal: Ability to identify and utilize available resources and services will improve Outcome: Progressing Goal: Ability to manage health-related needs will improve Outcome: Progressing   Problem: Metabolic: Goal: Ability to maintain appropriate glucose levels will improve Outcome: Progressing   Problem: Nutritional: Goal: Maintenance of adequate nutrition will improve Outcome: Progressing Goal: Progress toward achieving an optimal weight will improve Outcome: Progressing   Problem: Skin Integrity: Goal: Risk for impaired skin integrity will decrease Outcome: Progressing   Problem: Tissue Perfusion: Goal: Adequacy of tissue perfusion will improve Outcome: Progressing   Problem: Education: Goal: Knowledge of General Education information will improve Description: Including pain rating scale, medication(s)/side effects and non-pharmacologic comfort measures Outcome: Progressing   Problem: Health Behavior/Discharge Planning: Goal: Ability to manage health-related needs will improve Outcome:  Progressing   Problem: Clinical Measurements: Goal: Ability to maintain clinical measurements within normal limits will improve Outcome: Progressing Goal: Will remain free from infection Outcome: Progressing Goal: Diagnostic test results will improve Outcome: Progressing Goal: Respiratory complications will improve Outcome: Progressing Goal: Cardiovascular complication will be avoided Outcome: Progressing   Problem: Activity: Goal: Risk for activity intolerance will decrease Outcome: Progressing   Problem: Nutrition: Goal: Adequate nutrition will be maintained Outcome: Progressing   Problem: Coping: Goal: Level of anxiety will decrease Outcome: Progressing   Problem: Elimination: Goal: Will not experience complications related to bowel motility Outcome: Progressing Goal: Will not experience complications related to urinary retention Outcome: Progressing   Problem: Pain Managment: Goal: General experience of comfort will improve Outcome: Progressing   Problem: Safety: Goal: Ability to remain free from injury will improve Outcome: Progressing   Problem: Skin Integrity: Goal: Risk for impaired skin integrity will decrease Outcome: Progressing   Problem: Education: Goal: Ability to describe self-care measures that may prevent or decrease complications (Diabetes Survival Skills Education) will improve Outcome: Progressing Goal: Individualized Educational Video(s) Outcome: Progressing   Problem: Coping: Goal: Ability to adjust to condition or change in health will improve Outcome: Progressing   Problem: Fluid Volume: Goal: Ability to maintain a balanced intake and output will improve Outcome: Progressing   Problem: Health Behavior/Discharge Planning: Goal: Ability to identify and utilize available resources and services will improve Outcome: Progressing Goal: Ability to manage health-related needs will improve Outcome: Progressing   Problem: Metabolic: Goal:  Ability to maintain appropriate glucose levels will improve Outcome: Progressing   Problem: Nutritional: Goal: Maintenance of adequate nutrition will improve Outcome: Progressing Goal: Progress toward achieving an optimal weight will improve Outcome: Progressing   Problem: Skin Integrity: Goal: Risk for impaired skin integrity will decrease Outcome: Progressing   Problem: Tissue Perfusion: Goal: Adequacy of tissue perfusion will improve Outcome: Progressing

## 2022-08-29 DIAGNOSIS — R6510 Systemic inflammatory response syndrome (SIRS) of non-infectious origin without acute organ dysfunction: Secondary | ICD-10-CM | POA: Diagnosis not present

## 2022-08-29 DIAGNOSIS — I10 Essential (primary) hypertension: Secondary | ICD-10-CM | POA: Diagnosis not present

## 2022-08-29 DIAGNOSIS — I2510 Atherosclerotic heart disease of native coronary artery without angina pectoris: Secondary | ICD-10-CM | POA: Diagnosis not present

## 2022-08-29 DIAGNOSIS — J019 Acute sinusitis, unspecified: Secondary | ICD-10-CM

## 2022-08-29 DIAGNOSIS — B9689 Other specified bacterial agents as the cause of diseases classified elsewhere: Secondary | ICD-10-CM

## 2022-08-29 LAB — GLUCOSE, CAPILLARY
Glucose-Capillary: 161 mg/dL — ABNORMAL HIGH (ref 70–99)
Glucose-Capillary: 163 mg/dL — ABNORMAL HIGH (ref 70–99)
Glucose-Capillary: 124 mg/dL — ABNORMAL HIGH (ref 70–99)
Glucose-Capillary: 145 mg/dL — ABNORMAL HIGH (ref 70–99)

## 2022-08-29 LAB — CBC
HCT: 33.1 % — ABNORMAL LOW (ref 36.0–46.0)
Hemoglobin: 11.3 g/dL — ABNORMAL LOW (ref 12.0–15.0)
MCH: 32.4 pg (ref 26.0–34.0)
MCHC: 34.1 g/dL (ref 30.0–36.0)
MCV: 94.8 fL (ref 80.0–100.0)
Platelets: 155 10*3/uL (ref 150–400)
RBC: 3.49 MIL/uL — ABNORMAL LOW (ref 3.87–5.11)
RDW: 13.2 % (ref 11.5–15.5)
WBC: 7.6 10*3/uL (ref 4.0–10.5)
nRBC: 0 % (ref 0.0–0.2)

## 2022-08-29 LAB — BASIC METABOLIC PANEL WITH GFR
Anion gap: 10 (ref 5–15)
BUN: 11 mg/dL (ref 8–23)
CO2: 22 mmol/L (ref 22–32)
Calcium: 8.3 mg/dL — ABNORMAL LOW (ref 8.9–10.3)
Chloride: 100 mmol/L (ref 98–111)
Creatinine, Ser: 0.85 mg/dL (ref 0.44–1.00)
GFR, Estimated: >60 mL/min (ref >60)
Glucose, Bld: 109 mg/dL — ABNORMAL HIGH (ref 70–99)
Potassium: 3.4 mmol/L — ABNORMAL LOW (ref 3.5–5.1)
Sodium: 132 mmol/L — ABNORMAL LOW (ref 135–145)

## 2022-08-29 MED ORDER — SODIUM CHLORIDE 0.9 % IV SOLN
3.0000 g | Freq: Three times a day (TID) | INTRAVENOUS | Status: DC
  Administered 2022-08-29 – 2022-08-31 (×6): 3 g via INTRAVENOUS
  Filled 2022-08-29 (×6): qty 8

## 2022-08-29 MED ORDER — POTASSIUM CHLORIDE CRYS ER 20 MEQ PO TBCR
40.0000 meq | EXTENDED_RELEASE_TABLET | Freq: Four times a day (QID) | ORAL | Status: AC
  Administered 2022-08-29 (×2): 40 meq via ORAL
  Filled 2022-08-29 (×2): qty 2

## 2022-08-29 NOTE — Progress Notes (Signed)
PROGRESS NOTE    Anna Gamble  ZOX:[id number] DOB: 12-May-1935 DOA: 08/27/2022 PCP: [name], MD   Chief Complaint  Gamble presents with   Fall    Brief Narrative:   Anna Gamble  is a 87 y.o. 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, ambulates with a walker and mainly wheelchair at home, she presents to ED secondary to progressive weakness, fall, she appears to be confused, unable to provide reliable history, so it was obtained with the husband at bedside, per husband Gamble has not been feeling right for the last 24 hours, poor appetite yesterday, more sleepy and drowsy, appears to be more confused, this morning she was going to the bathroom, where she had a fall, where she fell forward, into her knees bilaterally, she did not strike her head, she is not on any blood thinners, there is no loss of consciousness, no focal deficits, denies dysuria, cough, she denies fever but she reports some chills. -In ED lactic acid elevated at 2.5, improved to 2.1 after 2 L of fluids, UA is negative, she denies any urinary symptoms, chest x-ray with no acute cardiopulmonary finding, CT head with no acute intracranial findings, x-rays of bilateral knees, and right hand significant for severe osteoarthritis, given her SIRS finding, and encephalopathic   Assessment & Plan:   Principal Problem:   SIRS (systemic inflammatory response syndrome) (HCC) Active Problems:   HLD (hyperlipidemia)   Essential hypertension   GERD (gastroesophageal reflux disease)   CAD (coronary artery disease)   Severe aortic valve stenosis   S/P TAVR (transcatheter aortic valve replacement)   Type 2 diabetes mellitus with hyperglycemia, without long-term current use of insulin (HCC)      Sepsis, due to acute bacterial sinusitis -Gamble presents with  altered mental status, low-grade temperature 99.3, lactic acidosis and leukocytosis -She was treated with broad-spectrum antibiotic, vancomycin, cefepime and Flagyl, blood cultures remain negative, MRSA PCR is negative, will DC vancomycin.  Septic workup remains negative so we will DC IV vancomycin and Flagyl. -Reports congestion, purulent discharge from her nose, headache upon laying supine, she is having clinical endings of acute bacterial sinusitis, will transition antibiotics to Unasyn. - so far no source of infection could be identified, negative UA, chest x-ray with no evidence of cardiopulmonary disease, she has negative meningeal signs -Follow-up blood cultures, so far remains negative at day 2 -COVID-19 test is negative   Acute metabolic encephalopathy -CT head with no acute findings -Most likely related to infection -She has negative meningeal signs -Mentation back to baseline   Diabetes mellitus Will hold metformin specially with lactic acidosis -Will keep on insulin sliding scale   Hyperlipidemia -Her statin appears to be discontinued by her PCP,   Hypertension -Blood pressure soft, so we will hold losartan, hydrochlorothiazide and metoprolol for now   History of aortic stenosis status post TAVR -Will monitor on telemetry, follow-up blood cultures   Weakness/deconditioning - PT/OT   Obesity class 3 Body mass index is 42.41 kg/m.   GERD-continue with PPI  Hypokalemia -Repleted  Hyponatremia-continue with gentle hydration     DVT prophylaxis: Lovenox Code Status: Full Family Communication: Discussed with husband at bedside Disposition:   Status is: Inpatient    Consultants:  None Subjective:  She reports headache, neck open laying flat, purulent nasal discharge, congestion and fullness and sinus area  Objective: Vitals:   08/29/22 0300  08/29/22 0400 08/29/22 0720 08/29/22 0800  BP:  (!) 118/54 108/67 (!) 129/50  Pulse:  82 82 88  Resp: 19 18 17  17   Temp:  97.8 F (36.6 C) 97.9 F (36.6 C) 97.8 F (36.6 C)  TempSrc:  Oral Oral Oral  SpO2:  94% 99% 99%  Weight:      Height:        Intake/Output Summary (Last 24 hours) at 08/29/2022 1201 Last data filed at 08/29/2022 1100 Gross per 24 hour  Intake --  Output 1900 ml  Net -1900 ml   Filed Weights   08/27/22 1629  Weight: 101.8 kg    Examination:  Awake Alert, Oriented X 3, No new F.N deficits, Normal affect Symmetrical Chest wall movement, Good air movement bilaterally, CTAB RRR,No Gallops,Rubs or new Murmurs, No Parasternal Heave +ve B.Sounds, Abd Soft, No tenderness, No rebound - guarding or rigidity. No Cyanosis, Clubbing or edema, No new Rash or bruise       Data Reviewed: I have personally reviewed following labs and imaging studies  CBC: Recent Labs  Lab 08/27/22 0651 08/28/22 0857 08/29/22 0206  WBC 12.0* 8.2 7.6  NEUTROABS 11.0*  --   --   HGB 12.5 12.8 11.3*  HCT 37.0 37.2 33.1*  MCV 93.0 94.9 94.8  PLT 171 164 155    Basic Metabolic Panel: Recent Labs  Lab 08/27/22 0651 08/28/22 0857 08/29/22 0206  NA 132* 136 132*  K 3.7 3.7 3.4*  CL 97* 102 100  CO2 24 23 22   GLUCOSE 210* 117* 109*  BUN 14 11 11   CREATININE 0.94 0.83 0.85  CALCIUM 8.6* 8.4* 8.3*  MG 1.8  --   --     GFR: Estimated Creatinine Clearance: 54 mL/min (by C-G formula based on SCr of 0.85 mg/dL).  Liver Function Tests: Recent Labs  Lab 08/27/22 0651 08/28/22 0857  AST 25 30  ALT 27 28  ALKPHOS 59 60  BILITOT 1.7* 1.1  PROT 6.2* 6.0*  ALBUMIN 3.1* 2.8*    CBG: Recent Labs  Lab 08/28/22 1141 08/28/22 1602 08/28/22 2125 08/29/22 0859 08/29/22 1156  GLUCAP 145* 100* 121* 161* 163*     Recent Results (from the past 240 hour(s))  Resp panel by RT-PCR (RSV, Flu A&B, Covid) Anterior Nasal Swab     Status: None   Collection Time: 08/27/22  7:55 AM   Specimen: Anterior Nasal Swab  Result Value Ref Range Status   SARS Coronavirus 2 by RT PCR NEGATIVE  NEGATIVE Final   Influenza A by PCR NEGATIVE NEGATIVE Final   Influenza B by PCR NEGATIVE NEGATIVE Final    Comment: (NOTE) The Xpert Xpress SARS-CoV-2/FLU/RSV plus assay is intended as an aid in the diagnosis of influenza from Nasopharyngeal swab specimens and should not be used as a sole basis for treatment. Nasal washings and aspirates are unacceptable for Xpert Xpress SARS-CoV-2/FLU/RSV testing.  Fact Sheet for Patients: BloggerCourse.com  Fact Sheet for Healthcare Providers: SeriousBroker.it  This test is not yet approved or cleared by the [place] FDA and has been authorized for detection and/or diagnosis of SARS-CoV-2 by FDA under an Emergency Use Authorization (EUA). This EUA will remain in effect (meaning this test can be used) for the duration of the COVID-19 declaration under Section 564(b)(1) of the Act, 21 U.S.C. section 360bbb-3(b)(1), unless the authorization is terminated or revoked.     Resp Syncytial Virus by PCR NEGATIVE NEGATIVE Final    Comment: (NOTE) Fact Sheet for Patients:  BloggerCourse.com  Fact Sheet for Healthcare Providers: SeriousBroker.it  This test is not yet approved or cleared by the [place] FDA and has been authorized for detection and/or diagnosis of SARS-CoV-2 by FDA under an Emergency Use Authorization (EUA). This EUA will remain in effect (meaning this test can be used) for the duration of the COVID-19 declaration under Section 564(b)(1) of the Act, 21 U.S.C. section 360bbb-3(b)(1), unless the authorization is terminated or revoked.  Performed at [facility], [address], [place], [place] [address]   Culture, blood (Routine X 2) w Reflex to ID Panel     Status: None (Preliminary result)   Collection Time: 08/27/22  4:25 PM   Specimen: BLOOD RIGHT HAND  Result Value Ref Range Status   Specimen Description BLOOD  RIGHT HAND  Final   Special Requests   Final    BOTTLES DRAWN AEROBIC AND ANAEROBIC Blood Culture results may not be optimal due to an inadequate volume of blood received in culture bottles   Culture   Final    NO GROWTH < 24 HOURS Performed at [facility], [address] [place], [place] [address]    Report Status PENDING  Incomplete  Culture, blood (Routine X 2) w Reflex to ID Panel     Status: None (Preliminary result)   Collection Time: 08/27/22  4:35 PM   Specimen: BLOOD  Result Value Ref Range Status   Specimen Description BLOOD LEFT ANTECUBITAL  Final   Special Requests   Final    BOTTLES DRAWN AEROBIC AND ANAEROBIC Blood Culture results may not be optimal due to an inadequate volume of blood received in culture bottles   Culture   Final    NO GROWTH < 24 HOURS Performed at [facility], [address] [place], [place] [address]    Report Status PENDING  Incomplete  MRSA Next Gen by PCR, Nasal     Status: None   Collection Time: 08/28/22  2:13 PM   Specimen: Nasal Mucosa; Nasal Swab  Result Value Ref Range Status   MRSA by PCR Next Gen NOT DETECTED NOT DETECTED Final    Comment: (NOTE) The GeneXpert MRSA Assay (FDA approved for NASAL specimens only), is one component of a comprehensive MRSA colonization surveillance program. It is not intended to diagnose MRSA infection nor to guide or monitor treatment for MRSA infections. Test performance is not FDA approved in patients less than 48 years old. Performed at [facility], [address], [place], [place] [address]          Radiology Studies: CT Head Wo Contrast  Result Date: 08/27/2022 CLINICAL DATA:  Head trauma, minor, normal mental status (Age 25-64y) EXAM: CT HEAD WITHOUT CONTRAST TECHNIQUE: Contiguous axial images were obtained from the base of the skull through the vertex without intravenous contrast. RADIATION DOSE REDUCTION: This exam was performed according to the departmental  dose-optimization program which includes automated exposure control, adjustment of the mA and/or kV according to Gamble size and/or use of iterative reconstruction technique. COMPARISON:  02/16/2016 FINDINGS: Brain: No evidence of acute infarction, hemorrhage, hydrocephalus, extra-axial collection or mass lesion/mass effect. Incidental note of a small amount of subependymal gray matter heterotopia. Patchy low-density changes within the periventricular and subcortical white matter most compatible with chronic microvascular ischemic change. Mild diffuse cerebral volume loss. Vascular: No hyperdense vessel or unexpected calcification. Skull: Normal. Negative for fracture or focal lesion. Sinuses/Orbits: No acute finding. Other: None. IMPRESSION: 1. No acute  intracranial findings. 2. Chronic microvascular ischemic change and cerebral volume loss. Electronically Signed   By: Duanne Guess D.O.   On: 08/27/2022 15:10        Scheduled Meds:  aspirin EC  81 mg Oral Daily   enoxaparin (LOVENOX) injection  50 mg Subcutaneous Q24H   insulin aspart  0-15 Units Subcutaneous TID WC   insulin aspart  0-5 Units Subcutaneous QHS   Continuous Infusions:  ceFEPime (MAXIPIME) IV Stopped (08/29/22 0421)   metronidazole 500 mg (08/29/22 0422)   vancomycin       LOS: 2 days       Huey Bienenstock, MD [facility]   To contact the attending provider between 7A-7P or the covering provider during after hours 7P-7A, please log into the web site [email or web address] and access using universal [place] password for that web site. If you do not have the password, please call the hospital operator.  08/29/2022, 12:01 PM

## 2022-08-29 NOTE — Plan of Care (Signed)
  Problem: Fluid Volume: Goal: Hemodynamic stability will improve Outcome: Progressing   Problem: Clinical Measurements: Goal: Diagnostic test results will improve Outcome: Progressing Goal: Signs and symptoms of infection will decrease Outcome: Progressing   Problem: Respiratory: Goal: Ability to maintain adequate ventilation will improve Outcome: Progressing   Problem: Education: Goal: Ability to describe self-care measures that may prevent or decrease complications (Diabetes Survival Skills Education) will improve Outcome: Progressing Goal: Individualized Educational Video(s) Outcome: Progressing   Problem: Coping: Goal: Ability to adjust to condition or change in health will improve Outcome: Progressing   Problem: Fluid Volume: Goal: Ability to maintain a balanced intake and output will improve Outcome: Progressing   Problem: Health Behavior/Discharge Planning: Goal: Ability to identify and utilize available resources and services will improve Outcome: Progressing Goal: Ability to manage health-related needs will improve Outcome: Progressing   Problem: Metabolic: Goal: Ability to maintain appropriate glucose levels will improve Outcome: Progressing   Problem: Nutritional: Goal: Maintenance of adequate nutrition will improve Outcome: Progressing Goal: Progress toward achieving an optimal weight will improve Outcome: Progressing   Problem: Skin Integrity: Goal: Risk for impaired skin integrity will decrease Outcome: Progressing   Problem: Tissue Perfusion: Goal: Adequacy of tissue perfusion will improve Outcome: Progressing   Problem: Education: Goal: Knowledge of General Education information will improve Description: Including pain rating scale, medication(s)/side effects and non-pharmacologic comfort measures Outcome: Progressing   Problem: Health Behavior/Discharge Planning: Goal: Ability to manage health-related needs will improve Outcome:  Progressing   Problem: Clinical Measurements: Goal: Ability to maintain clinical measurements within normal limits will improve Outcome: Progressing Goal: Will remain free from infection Outcome: Progressing Goal: Diagnostic test results will improve Outcome: Progressing Goal: Respiratory complications will improve Outcome: Progressing Goal: Cardiovascular complication will be avoided Outcome: Progressing   Problem: Activity: Goal: Risk for activity intolerance will decrease Outcome: Progressing   Problem: Nutrition: Goal: Adequate nutrition will be maintained Outcome: Progressing   Problem: Coping: Goal: Level of anxiety will decrease Outcome: Progressing   Problem: Elimination: Goal: Will not experience complications related to bowel motility Outcome: Progressing Goal: Will not experience complications related to urinary retention Outcome: Progressing   Problem: Pain Managment: Goal: General experience of comfort will improve Outcome: Progressing   Problem: Safety: Goal: Ability to remain free from injury will improve Outcome: Progressing   Problem: Skin Integrity: Goal: Risk for impaired skin integrity will decrease Outcome: Progressing   Problem: Education: Goal: Ability to describe self-care measures that may prevent or decrease complications (Diabetes Survival Skills Education) will improve Outcome: Progressing Goal: Individualized Educational Video(s) Outcome: Progressing   Problem: Coping: Goal: Ability to adjust to condition or change in health will improve Outcome: Progressing   Problem: Fluid Volume: Goal: Ability to maintain a balanced intake and output will improve Outcome: Progressing   Problem: Health Behavior/Discharge Planning: Goal: Ability to identify and utilize available resources and services will improve Outcome: Progressing Goal: Ability to manage health-related needs will improve Outcome: Progressing   Problem: Metabolic: Goal:  Ability to maintain appropriate glucose levels will improve Outcome: Progressing   Problem: Nutritional: Goal: Maintenance of adequate nutrition will improve Outcome: Progressing Goal: Progress toward achieving an optimal weight will improve Outcome: Progressing   Problem: Skin Integrity: Goal: Risk for impaired skin integrity will decrease Outcome: Progressing   Problem: Tissue Perfusion: Goal: Adequacy of tissue perfusion will improve Outcome: Progressing

## 2022-08-29 NOTE — Evaluation (Addendum)
Occupational Therapy Evaluation Patient Details Name: Anna Gamble MRN: [id number] DOB: 1935/12/16 Today's Date: 08/29/2022   History of Present Illness Anna Gamble  is a 87 y.o. 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, ambulates with a walker and mainly wheelchair at home, she presents to ED secondary to progressive weakness, fall, and confusion.CT head with no acute intracranial findings. X-rays of bilateral knees and hand negative for acute abnormality but noted for severe osteoarthritis.   Clinical Impression   PTA, pt was living at home with her husband and son, pt reports she was independent with ADL/IADL and modified independent with functional mobility with use of w/c and RW. Pt currently requires modA for bed mobility and modA for sit<>stand and step pivot to the recliner. She requires modA for LB dressing. Pt reports cognition is improving but still "has room for improvement". Pt reports her son and husband are unable to physically assist her at home. Patient will benefit from continued inpatient follow up therapy, <3 hours/day. Will continue to follow acutely and progress appropriately.         If plan is discharge home, recommend the following: A little help with walking and/or transfers;A little help with bathing/dressing/bathroom    Functional Status Assessment  Patient has had a recent decline in their functional status and demonstrates the ability to make significant improvements in function in a reasonable and predictable amount of time.  Equipment Recommendations  BSC/3in1    Recommendations for Other Services       Precautions / Restrictions Precautions Precautions: Fall Restrictions Weight Bearing Restrictions: No      Mobility Bed Mobility Overal bed mobility: Needs Assistance Bed Mobility:  Supine to Sit     Supine to sit: Mod assist     General bed mobility comments: modA for trunk progression and RLE progression off EOB    Transfers Overall transfer level: Needs assistance Equipment used: Rolling walker (2 wheels) Transfers: Sit to/from Stand, Bed to chair/wheelchair/BSC Sit to Stand: Mod assist     Step pivot transfers: Min assist     General transfer comment: modA with rocking technique to powerup into standing, once standing minA for safety and stability with step pivot transfer from EOB to recliner      Balance Overall balance assessment: Needs assistance Sitting-balance support: Feet supported, No upper extremity supported Sitting balance-Leahy Scale: Good     Standing balance support: Single extremity supported, During functional activity, Reliant on assistive device for balance Standing balance-Leahy Scale: Fair                             ADL either performed or assessed with clinical judgement   ADL Overall ADL's : Needs assistance/impaired Eating/Feeding: Independent   Grooming: Set up;Sitting   Upper Body Bathing: Set up;Sitting   Lower Body Bathing: Moderate assistance;Sit to/from stand   Upper Body Dressing : Set up;Sitting   Lower Body Dressing: Moderate assistance;Sit to/from stand Lower Body Dressing Details (indicate cue type and reason): assist to don briefs Toilet Transfer: Moderate assistance Toilet Transfer Details (indicate cue type and reason): simulated with sit<>stand from EOB pt requiring modA to powerup into standing Toileting- Clothing Manipulation and Hygiene: Moderate assistance;Sit to/from stand       Functional mobility during ADLs: Moderate assistance;Rolling walker (2 wheels) General ADL Comments: modA to powerup into standing  once standing pt requiring minA for stand pivot transfer     Vision         Perception         Praxis         Pertinent Vitals/Pain Pain Assessment Pain  Assessment: 0-10 Pain Score: 6  Pain Location: pain in R knee Pain Descriptors / Indicators: Sore, Guarding, Grimacing Pain Intervention(s): Limited activity within patient's tolerance, Monitored during session, Repositioned     Extremity/Trunk Assessment Upper Extremity Assessment Upper Extremity Assessment: Overall WFL for tasks assessed   Lower Extremity Assessment Lower Extremity Assessment: RLE deficits/detail;Defer to PT evaluation RLE Deficits / Details: noted increased pain in R knee impacting MMT will continue to assess. no knee buckling noted with standing.       Communication Communication Communication: No apparent difficulties   Cognition Arousal: Alert Behavior During Therapy: WFL for tasks assessed/performed Overall Cognitive Status: Impaired/Different from baseline Area of Impairment: Memory, Safety/judgement                     Memory: Decreased short-term memory   Safety/Judgement: Decreased awareness of safety     General Comments: pt reports feeling closer to baseline but still room for improvement. Pt with slow processing but A&Ox4 and WFL for basic tasks     General Comments  VSS on RA    Exercises     Shoulder Instructions      Home Living Family/patient expects to be discharged to:: Private residence Living Arrangements: Spouse/significant other Available Help at Discharge: Family;Available PRN/intermittently Type of Home: House Home Access: Ramped entrance     Home Layout: One level     Bathroom Shower/Tub: Producer, television/film/video: Handicapped height Bathroom Accessibility: Yes How Accessible: Accessible via wheelchair Home Equipment: Shower seat - built in;Cane - single point;Rollator (4 wheels);Wheelchair - manual;Other (comment) (adjustable bed (hob and foot))   Additional Comments: pt's son lives with her and husband, pt is a paraplegic does not require assistance      Prior Functioning/Environment Prior  Level of Function : Independent/Modified Independent             Mobility Comments: modified independent at w/c level. Pt reports stand pivot transfers to w/c ADLs Comments: independent with ADL, independent with cooking and medication, husband does the driving,        OT Problem List: Decreased strength;Decreased activity tolerance;Impaired balance (sitting and/or standing);Decreased safety awareness;Pain      OT Treatment/Interventions: Self-care/ADL training;Therapeutic exercise;Energy conservation;DME and/or AE instruction;Therapeutic activities;Patient/family education;Balance training    OT Goals(Current goals can be found in the care plan section) Acute Rehab OT Goals Patient Stated Goal: to get stronger and go home OT Goal Formulation: With patient Time For Goal Achievement: 09/12/22 Potential to Achieve Goals: Good ADL Goals Pt Will Perform Lower Body Dressing: with modified independence;sit to/from stand Pt Will Transfer to Toilet: with modified independence;ambulating Additional ADL Goal #1: Pt will demonstrate independence with medication management.  OT Frequency: Min 2X/week    Co-evaluation              AM-PAC OT "6 Clicks" Daily Activity     Outcome Measure Help from another person eating meals?: None Help from another person taking care of personal grooming?: A Little Help from another person toileting, which includes using toliet, bedpan, or urinal?: A Lot Help from another person bathing (including washing, rinsing, drying)?: A Lot Help from another person to put on and taking off regular upper body  clothing?: A Little Help from another person to put on and taking off regular lower body clothing?: A Lot 6 Click Score: 16   End of Session Equipment Utilized During Treatment: Gait belt;Rolling walker (2 wheels) Nurse Communication: Mobility status  Activity Tolerance: Patient tolerated treatment well Patient left: in chair;with call bell/phone within  reach;with chair alarm set;with family/visitor present  OT Visit Diagnosis: Unsteadiness on feet (R26.81);Other abnormalities of gait and mobility (R26.89);Muscle weakness (generalized) (M62.81);History of falling (Z91.81);Pain Pain - Right/Left: Right Pain - part of body: Knee                Time: 0930-1017 OT Time Calculation (min): 47 min Charges:  OT General Charges $OT Visit: 1 Visit OT Evaluation $OT Eval Moderate Complexity: 1 Mod OT Treatments $Self Care/Home Management : 23-37 mins  Rosey Bath OTR/L Acute Rehabilitation Services Office: [phone]   Providence Crosby 08/29/2022, 11:53 AM

## 2022-08-29 NOTE — Evaluation (Signed)
Physical Therapy Evaluation Patient Details Name: Anna Gamble MRN: [id number] DOB: 09-04-1935 Today's Date: 08/29/2022  History of Present Illness  Pt is 87 y.o. female admitted on 08/27/22 for progressive weakness, confusion, and fall.  Pt dx with sepsis d/t acute bacterial sinusitis, and acute metabolic encephalopathy.  Pt with significant PMH of CAD, HTN, morbid obesity, severe aortic stenosis s/p TAVR, R foot surgery, and R TKA.  Clinical Impression  Pt is weaker than baseline, but improving daily.  Husband reports she is much more with it than when she was admitted and she required less assistance to transfer this afternoon than she did this morning (min assist this afternoon mod A this morning).  At baseline, she is not very active due to a chronically painful R knee.  She is mostly WC mobile at home with a very short distance walk to get through her bathroom with a cane and furniture for support as the WC does not fit into the bathroom.   PT to follow acutely for deficits listed below.         If plan is discharge home, recommend the following: A lot of help with walking and/or transfers;A lot of help with bathing/dressing/bathroom;Assistance with cooking/housework;Assist for transportation;Help with stairs or ramp for entrance   Can travel by private vehicle   No    Equipment Recommendations None recommended by PT  Recommendations for Other Services       Functional Status Assessment Patient has had a recent decline in their functional status and demonstrates the ability to make significant improvements in function in a reasonable and predictable amount of time.     Precautions / Restrictions Precautions Precautions: Fall Restrictions Weight Bearing Restrictions: No      Mobility  Bed Mobility Overal bed mobility: Needs Assistance Bed Mobility: Supine to Sit, Sit to Supine       Sit to supine: Mod assist   General bed mobility comments: Mod assist to help lift  both legs back into bed.  Pt reports she sleeps in a recliner chair at baseline.  It is more comfortable for her knee. It is NOT a lift chair.    Transfers Overall transfer level: Needs assistance Equipment used: Rolling walker (2 wheels) Transfers: Sit to/from Stand, Bed to chair/wheelchair/BSC Sit to Stand: Min assist   Step pivot transfers: Min assist       General transfer comment: Min assist to come to standing from low recliner chair, Min assist to steady pt to turn with RW to bed.  Pt generally does not walk except for a very short distance in her bathroom where the WC cannot fit.  She uses a cane and the bathroom fixtures to hold onto.  She fell in the bathroom the day of admission here.    Ambulation/Gait                  Stairs            Wheelchair Mobility     Tilt Bed    Modified Rankin (Stroke Patients Only)       Balance Overall balance assessment: Needs assistance Sitting-balance support: Feet supported, No upper extremity supported Sitting balance-Leahy Scale: Good     Standing balance support: Reliant on assistive device for balance, Bilateral upper extremity supported Standing balance-Leahy Scale: Poor Standing balance comment: needs min assist in standing with heavy reliance on AD for support.  Pertinent Vitals/Pain Pain Assessment Pain Assessment: 0-10 Pain Score: 4  Pain Location: pain in R knee Pain Descriptors / Indicators: Sore, Guarding, Grimacing Pain Intervention(s): Limited activity within patient's tolerance, Monitored during session, Repositioned    Home Living Family/patient expects to be discharged to:: Private residence Living Arrangements: Spouse/significant other Available Help at Discharge: Family;Available PRN/intermittently Type of Home: House Home Access: Ramped entrance       Home Layout: One level Home Equipment: Shower seat - built in;Cane - single point;Rollator  (4 wheels);Wheelchair - manual;Other (comment) (adjustable bed (hob and foot)) Additional Comments: pt's son lives with her and husband, pt is a paraplegic does not require assistance    Prior Function Prior Level of Function : Independent/Modified Independent             Mobility Comments: modified independent at w/c level. Pt reports stand pivot transfers to w/c ADLs Comments: independent with ADL, independent with cooking and medication, husband does the driving,     Extremity/Trunk Assessment   Upper Extremity Assessment Upper Extremity Assessment: Defer to OT evaluation    Lower Extremity Assessment Lower Extremity Assessment: RLE deficits/detail RLE Deficits / Details: noted increased pain in R knee impacting MMT will continue to assess. no knee buckling noted with standing, but unable to do more than 1-2 LAQs with PT in sitting.    Cervical / Trunk Assessment Cervical / Trunk Assessment: Normal  Communication   Communication Communication: No apparent difficulties  Cognition Arousal: Alert Behavior During Therapy: WFL for tasks assessed/performed Overall Cognitive Status: Impaired/Different from baseline Area of Impairment: Memory, Safety/judgement                     Memory: Decreased short-term memory   Safety/Judgement: Decreased awareness of safety     General Comments: pt reports feeling closer to baseline but still room for improvement. Pt with slow processing but A&Ox4 and WFL for basic tasks        General Comments General comments (skin integrity, edema, etc.): VSS    Exercises General Exercises - Lower Extremity Ankle Circles/Pumps: AAROM, Both, 10 reps, Seated Long Arc Quad: AROM, Left, 10 reps (attempted on R but too painful to continue after 2 reps) Hip Flexion/Marching: AROM, Both, 10 reps   Assessment/Plan    PT Assessment Patient needs continued PT services  PT Problem List Decreased strength;Decreased range of motion;Decreased  activity tolerance;Decreased balance;Decreased mobility;Decreased knowledge of use of DME;Obesity;Pain       PT Treatment Interventions DME instruction;Gait training;Functional mobility training;Therapeutic activities;Therapeutic exercise;Balance training;Patient/family education;Wheelchair mobility training;Modalities    PT Goals (Current goals can be found in the Care Plan section)  Acute Rehab PT Goals Patient Stated Goal: to get back to her normal self, and go back home as soon as she can PT Goal Formulation: With patient/family Time For Goal Achievement: 09/12/22 Potential to Achieve Goals: Good    Frequency Min 1X/week     Co-evaluation               AM-PAC PT "6 Clicks" Mobility  Outcome Measure Help needed turning from your back to your side while in a flat bed without using bedrails?: A Little Help needed moving from lying on your back to sitting on the side of a flat bed without using bedrails?: A Lot Help needed moving to and from a bed to a chair (including a wheelchair)?: A Little Help needed standing up from a chair using your arms (e.g., wheelchair or bedside chair)?: A Little  Help needed to walk in hospital room?: Total Help needed climbing 3-5 steps with a railing? : Total 6 Click Score: 13    End of Session   Activity Tolerance: Patient limited by pain;Patient limited by fatigue Patient left: in bed;with call bell/phone within reach;with bed alarm set;with family/visitor present   PT Visit Diagnosis: Muscle weakness (generalized) (M62.81);Difficulty in walking, not elsewhere classified (R26.2);Pain Pain - Right/Left: Right Pain - part of body: Knee    Time: 2993-7169 PT Time Calculation (min) (ACUTE ONLY): 29 min   Charges:   PT Evaluation $PT Eval Moderate Complexity: 1 Mod PT Treatments $Therapeutic Activity: 8-22 mins PT General Charges $$ ACUTE PT VISIT: 1 Visit         Corinna Capra, PT, DPT  Acute Rehabilitation Secure chat is best for  contact #[phone] office

## 2022-08-29 NOTE — Plan of Care (Signed)
  Problem: Coping: Goal: Ability to adjust to condition or change in health will improve Outcome: Progressing   Problem: Skin Integrity: Goal: Risk for impaired skin integrity will decrease Outcome: Progressing   Problem: Elimination: Goal: Will not experience complications related to bowel motility Outcome: Progressing   Problem: Safety: Goal: Ability to remain free from injury will improve Outcome: Progressing   Problem: Skin Integrity: Goal: Risk for impaired skin integrity will decrease Outcome: Progressing

## 2022-08-30 ENCOUNTER — Inpatient Hospital Stay (HOSPITAL_COMMUNITY): Payer: Medicare Other

## 2022-08-30 DIAGNOSIS — R6510 Systemic inflammatory response syndrome (SIRS) of non-infectious origin without acute organ dysfunction: Secondary | ICD-10-CM | POA: Diagnosis not present

## 2022-08-30 DIAGNOSIS — I10 Essential (primary) hypertension: Secondary | ICD-10-CM | POA: Diagnosis not present

## 2022-08-30 DIAGNOSIS — E1165 Type 2 diabetes mellitus with hyperglycemia: Secondary | ICD-10-CM

## 2022-08-30 LAB — GLUCOSE, CAPILLARY
Glucose-Capillary: 128 mg/dL — ABNORMAL HIGH (ref 70–99)
Glucose-Capillary: 126 mg/dL — ABNORMAL HIGH (ref 70–99)
Glucose-Capillary: 116 mg/dL — ABNORMAL HIGH (ref 70–99)
Glucose-Capillary: 125 mg/dL — ABNORMAL HIGH (ref 70–99)

## 2022-08-30 NOTE — TOC Initial Note (Signed)
Transition of Care [facility]) - Initial/Assessment Note    Patient Details  Name: Anna Gamble MRN: [id number] Date of Birth: [birth year]  [facility] CM/SW Contact:    Mearl Latin, LCSW Phone Number: 08/30/2022, 4:35 PM  Clinical Narrative:                 CSW received consult for possible SNF versus home health services at time of discharge. CSW spoke with patient and spouse. Patient reported that she would like home health services instead of SNF as she feels comfortable with her home set up which is handicap accessible since their paraplegic son lives with them. CSW discussed equipment needs and patient stated she has all needed DME including a rolling walker, wheelchair, and bedside commode. CSW confirmed PCP and address. Spouse reported he will come pick up patient at discharge. No further questions reported at this time. CIR is also evaluating patient.      Barriers to Discharge: Continued Medical Work up   Patient Goals and CMS Choice Patient states their goals for this hospitalization and ongoing recovery are:: Return home CMS Medicare.gov Compare Post Acute Care list provided to:: Patient Choice offered to / list presented to : Patient, Spouse [place] ownership interest in [facility].provided to:: Patient    Expected Discharge Plan and Services In-house Referral: Clinical Social Work   Post Acute Care Choice: IP Rehab, Home Health Living arrangements for the past 2 months: Single Family Home                                      Prior Living Arrangements/Services Living arrangements for the past 2 months: Single Family Home Lives with:: Spouse Patient language and need for interpreter reviewed:: Yes Do you feel safe going back to the place where you live?: Yes      Need for Family Participation in Patient Care: Yes (Comment) Care giver support system in place?: Yes (comment) Current home services: DME (Walker, WC, bedside  commode, ramp) Criminal Activity/Legal Involvement Pertinent to Current Situation/Hospitalization: No - Comment as needed  Activities of Daily Living Home Assistive Devices/Equipment: Wheelchair ADL Screening (condition at time of admission) Patient's cognitive ability adequate to safely complete daily activities?: Yes Is the patient deaf or have difficulty hearing?: No Does the patient have difficulty seeing, even when wearing glasses/contacts?: No Does the patient have difficulty concentrating, remembering, or making decisions?: No Patient able to express need for assistance with ADLs?: No Does the patient have difficulty dressing or bathing?: No Independently performs ADLs?: Yes (appropriate for developmental age) Does the patient have difficulty walking or climbing stairs?: Yes Weakness of Legs: Both Weakness of Arms/Hands: Both  Permission Sought/Granted Permission sought to share information with : Facility Medical sales representative, Family Supports Permission granted to share information with : Yes, Verbal Permission Granted     Permission granted to share info w AGENCY: HH  Permission granted to share info w Relationship: Spouse  Permission granted to share info w Contact Information: [phone]  Emotional Assessment Appearance:: Appears stated age Attitude/Demeanor/Rapport: Engaged Affect (typically observed): Accepting, Pleasant, Appropriate Orientation: : Oriented to Self, Oriented to Place, Oriented to  Time, Oriented to Situation Alcohol / Substance Use: Not Applicable Psych Involvement: No (comment)  Admission diagnosis:  Weakness [R53.1] SIRS (systemic inflammatory response syndrome) (HCC) [R65.10] Patient Active Problem List   Diagnosis Date Noted   SIRS (systemic inflammatory response  syndrome) (HCC) 08/27/2022   Type 2 diabetes mellitus with hyperglycemia, without long-term current use of insulin (HCC) 06/01/2022   S/P TAVR (transcatheter aortic valve  replacement)    Severe aortic valve stenosis    CAD (coronary artery disease) 03/19/2014   GERD (gastroesophageal reflux disease)    UNSPECIFIED OSTEOPOROSIS 03/26/2008   IMPAIRED FASTING GLUCOSE 03/23/2007   FASTING HYPERGLYCEMIA 12/27/2006   HLD (hyperlipidemia) 07/21/2006   Morbid obesity (HCC) 07/21/2006   Essential hypertension 07/21/2006   LOW BACK PAIN 07/21/2006   PCP:  [name], MD Pharmacy:   [facility] - [place], [place] - [id number] [place] [address] Phone: [phone] Fax: [phone]  [facility] [id number] - [address] [place] - [address] [place] [address] Phone: [phone] Fax: [phone]     Social Determinants of Health (SDOH) Social History: SDOH Screenings   Food Insecurity: No Food Insecurity (08/27/2022)  Housing: Patient Declined (08/27/2022)  Transportation Needs: No Transportation Needs (08/27/2022)  Utilities: Not At Risk (08/27/2022)  Alcohol Screen: Low Risk  (08/13/2022)  Depression (PHQ2-9): Low Risk  (08/13/2022)  Financial Resource Strain: Low Risk  (08/13/2022)  Physical Activity: Inactive (08/13/2022)  Social Connections: Socially Integrated (08/13/2022)  Stress: No Stress Concern Present (08/13/2022)  Tobacco Use: Low Risk  (08/13/2022)  Health Literacy: Adequate Health Literacy (08/13/2022)   SDOH Interventions:     Readmission Risk Interventions     No data to display

## 2022-08-30 NOTE — Progress Notes (Signed)
Inpatient Rehab Admissions Coordinator:  ? ?Per therapy recommendations,  patient was screened for CIR candidacy by Laura Staley, MS, CCC-SLP. At this time, Pt. Appears to be a a potential candidate for CIR. I will place   order for rehab consult per protocol for full assessment. Please contact me any with questions. ? ?Laura Staley, MS, CCC-SLP ?Rehab Admissions Coordinator  ?[phone] (celll) ?[phone] (office) ? ?

## 2022-08-30 NOTE — Progress Notes (Signed)
Occupational Therapy Treatment Patient Details Name: Anna Gamble MRN: [id number] DOB: 10-16-1935 Today's Date: 08/30/2022   History of present illness Pt is 87 y.o. female admitted on 08/27/22 for progressive weakness, confusion, and fall.  Pt dx with sepsis d/t acute bacterial sinusitis, and acute metabolic encephalopathy.  Pt with significant PMH of CAD, HTN, morbid obesity, severe aortic stenosis s/p TAVR, R foot surgery, and R TKA.   OT comments  Pt is making solid progress towards their acute OT goals. Upon arrival pt was discussing discharge plans and is currently agreeable to aim for intensive therapy in hopes to be able to progress to home at her previous level of function. Currently she still needs mod A for bed mobility (pt sleeps in recliner at home). Notable improvement in STS, needing CGA-min A and CGA for pivotal stepping to simulate a BSC toileting transfer. OT to continue to follow acutely to facilitate progress towards established goals. Currently recommend intensive inpatient follow up therapy, >3 hours/day after discharge.       If plan is discharge home, recommend the following:  A little help with walking and/or transfers;A little help with bathing/dressing/bathroom   Equipment Recommendations  BSC/3in1       Precautions / Restrictions Precautions Precautions: Fall Restrictions Weight Bearing Restrictions: No       Mobility Bed Mobility Overal bed mobility: Needs Assistance Bed Mobility: Supine to Sit, Sit to Supine     Supine to sit: Mod assist Sit to supine: Mod assist        Transfers Overall transfer level: Needs assistance Equipment used: Rolling walker (2 wheels) Transfers: Sit to/from Stand, Bed to chair/wheelchair/BSC Sit to Stand: Min assist           General transfer comment: min A from low surface, min G from elevated surface     Balance Overall balance assessment: Needs assistance Sitting-balance support: Feet supported, No  upper extremity supported Sitting balance-Leahy Scale: Good     Standing balance support: Reliant on assistive device for balance, Bilateral upper extremity supported Standing balance-Leahy Scale: Poor                             ADL either performed or assessed with clinical judgement   ADL Overall ADL's : Needs assistance/impaired                         Toilet Transfer: Minimal assistance;Ambulation;Rolling walker (2 wheels) Toilet Transfer Details (indicate cue type and reason): continues to be limited to pivotal stepping only         Functional mobility during ADLs: Minimal assistance;Cueing for safety;Rolling walker (2 wheels) General ADL Comments: cues for safety, limitd by activity tolerance and fatigue from prior PT session    Extremity/Trunk Assessment Upper Extremity Assessment Upper Extremity Assessment: Generalized weakness   Lower Extremity Assessment Lower Extremity Assessment: Defer to PT evaluation        Vision   Vision Assessment?: No apparent visual deficits Additional Comments: Pt reports severe dry eye as the reason as to why she holds her R eye closed. Denies diplopia with both eyes open   Perception Perception Perception: Not tested   Praxis Praxis Praxis: Not tested    Cognition Arousal: Alert Behavior During Therapy: [facility] for tasks assessed/performed Overall Cognitive Status: Within Functional Limits for tasks assessed                 General  Comments: OVerall WFL for simple tasks this date, would benefit from higer level assessment              General Comments VSS, husband present    Pertinent Vitals/ Pain       Pain Assessment Pain Assessment: No/denies pain Pain Intervention(s): Monitored during session   Frequency  Min 1X/week        Progress Toward Goals  OT Goals(current goals can now be found in the care plan section)  Progress towards OT goals: Progressing toward goals  Acute Rehab  OT Goals Patient Stated Goal: to go to rehab OT Goal Formulation: With patient Time For Goal Achievement: 09/12/22 Potential to Achieve Goals: Good ADL Goals Pt Will Perform Lower Body Dressing: with modified independence;sit to/from stand Pt Will Transfer to Toilet: with modified independence;ambulating Additional ADL Goal #1: Pt will demonstrate independence with medication management.         AM-PAC OT "6 Clicks" Daily Activity     Outcome Measure   Help from another person eating meals?: None Help from another person taking care of personal grooming?: A Little Help from another person toileting, which includes using toliet, bedpan, or urinal?: A Little Help from another person bathing (including washing, rinsing, drying)?: A Lot Help from another person to put on and taking off regular upper body clothing?: A Little Help from another person to put on and taking off regular lower body clothing?: A Lot 6 Click Score: 17    End of Session Equipment Utilized During Treatment: Gait belt  OT Visit Diagnosis: Unsteadiness on feet (R26.81)   Activity Tolerance Patient tolerated treatment well   Patient Left in bed;with call bell/phone within reach;with family/visitor present   Nurse Communication Mobility status        Time: 1340-1359 OT Time Calculation (min): 19 min  Charges: OT General Charges $OT Visit: 1 Visit OT Treatments $Therapeutic Activity: 8-22 mins  Anna Gamble, OTR/L Acute Rehabilitation Services Office [phone] Secure Chat Communication Preferred   Anna Gamble 08/30/2022, 2:45 PM

## 2022-08-30 NOTE — Plan of Care (Signed)
  Problem: Coping: Goal: Ability to adjust to condition or change in health will improve Outcome: Progressing   Problem: Skin Integrity: Goal: Risk for impaired skin integrity will decrease Outcome: Progressing   Problem: Activity: Goal: Risk for activity intolerance will decrease Outcome: Progressing   Problem: Pain Managment: Goal: General experience of comfort will improve Outcome: Progressing   Problem: Safety: Goal: Ability to remain free from injury will improve Outcome: Progressing   Problem: Coping: Goal: Ability to adjust to condition or change in health will improve Outcome: Progressing

## 2022-08-30 NOTE — Care Management Important Message (Signed)
Important Message  Patient Details  Name: Anna Gamble MRN: [id number] Date of Birth: 09-10-1935   Medicare Important Message Given:  Yes     Ismaeel Arvelo Stefan Church 08/30/2022, 3:17 PM

## 2022-08-30 NOTE — Progress Notes (Signed)
Physical Therapy Treatment Patient Details Name: Kinze Dauphine MRN: [id number] DOB: 08-Jan-1936 Today's Date: 08/30/2022   History of Present Illness Pt is 87 y.o. female admitted on 08/27/22 for progressive weakness, confusion, and fall.  Pt dx with sepsis d/t acute bacterial sinusitis, and acute metabolic encephalopathy.  Pt with significant PMH of CAD, HTN, morbid obesity, severe aortic stenosis s/p TAVR, R foot surgery, and R TKA.    PT Comments  Pt is slowly progressing towards goals. Currently pt is below PLOF; pt was previously Mod I. Currently pt requires Min A for functional mobility. Pt is at a high risk for falls due to impaired strength/balance with functional mobility. Due to pt PLOF, home set up, available assistance at home and current level of function recommend skilled physical therapy services at a higher level of care and frequency (5-6x/weekly) in order to address strength, balance, functional mobility to decrease risk for falls, injury, decrease level of assist required from spouse, decrease risk for immobility and re-hospitalization. Pt tolerated treatment session well.     If plan is discharge home, recommend the following: A little help with walking and/or transfers;Assist for transportation   Can travel by private vehicle     Yes  Equipment Recommendations  None recommended by PT    Recommendations for Other Services Rehab consult     Precautions / Restrictions Precautions Precautions: Fall Restrictions Weight Bearing Restrictions: No     Mobility  Bed Mobility Overal bed mobility: Needs Assistance Bed Mobility: Supine to Sit, Sit to Supine     Supine to sit: Mod assist Sit to supine: Min assist   General bed mobility comments: Mod A at trunk and for scooting with supine to sitting and Min a for bil LE with sitting to supine. Patient Response: Cooperative  Transfers Overall transfer level: Needs assistance Equipment used: Rolling walker (2  wheels) Transfers: Sit to/from Stand, Bed to chair/wheelchair/BSC Sit to Stand: Min assist   Step pivot transfers: Min assist       General transfer comment: min A from low surface with occasional verbal cues for hand placement.    Ambulation/Gait Ambulation/Gait assistance: Min assist Gait Distance (Feet): 15 Feet Assistive device: Rolling walker (2 wheels) Gait Pattern/deviations: Step-through pattern, Decreased step length - right, Decreased step length - left, Antalgic Gait velocity: decreased cadence Gait velocity interpretation: <1.31 ft/sec, indicative of household ambulator   General Gait Details: very short steps, partial step through, difficulty maintaining body habitus appropriate distance from AD.     [employer]chant [employer] officer mobility: Yes Wheelchair propulsion: Both lower extermities, Both upper extremities Wheelchair parts: Needs assistance Distance: 50 ft with Min A for navigation and to get movement. Prior pt was Mod I for W/C mobility   Tilt Bed Tilt Bed Patient Response: Cooperative  Modified Rankin (Stroke Patients Only)       Balance Overall balance assessment: Needs assistance Sitting-balance support: Feet supported, No upper extremity supported Sitting balance-Leahy Scale: Good     Standing balance support: Reliant on assistive device for balance, Bilateral upper extremity supported Standing balance-Leahy Scale: Poor Standing balance comment: needs min assist in standing with heavy reliance on AD for support.      Cognition Arousal: Alert Behavior During Therapy: WFL for tasks assessed/performed Overall Cognitive Status: Within Functional Limits for tasks assessed             General Comments General comments (skin integrity, edema, etc.): spouse present throughout session. Very supportive.  Pertinent Vitals/Pain Pain Assessment Pain Assessment: No/denies pain     PT Goals (current goals can now  be found in the care plan section) Acute Rehab PT Goals Patient Stated Goal: to get back to her normal self, and go back home as soon as she can PT Goal Formulation: With patient/family Time For Goal Achievement: 09/12/22 Potential to Achieve Goals: Good Additional Goals Additional Goal #1: Pt will preform WC mobility >75' with bil LEs mod I Progress towards PT goals: Progressing toward goals    Frequency    Min 1X/week      PT Plan  Update POC to higher frequency following discharge       AM-PAC PT "6 Clicks" Mobility   Outcome Measure  Help needed turning from your back to your side while in a flat bed without using bedrails?: A Little Help needed moving from lying on your back to sitting on the side of a flat bed without using bedrails?: A Lot Help needed moving to and from a bed to a chair (including a wheelchair)?: A Little Help needed standing up from a chair using your arms (e.g., wheelchair or bedside chair)?: A Little Help needed to walk in hospital room?: A Little Help needed climbing 3-5 steps with a railing? : Total 6 Click Score: 15    End of Session Equipment Utilized During Treatment: Gait belt Activity Tolerance: Patient tolerated treatment well Patient left: in bed;with call bell/phone within reach;with bed alarm set;with family/visitor present Nurse Communication: Mobility status PT Visit Diagnosis: Muscle weakness (generalized) (M62.81);Difficulty in walking, not elsewhere classified (R26.2);Pain     Time: 4098-1191 PT Time Calculation (min) (ACUTE ONLY): 24 min  Charges:    $Therapeutic Activity: 23-37 mins PT General Charges $$ ACUTE PT VISIT: 1 Visit                    Harrel Carina, DPT, CLT  Acute Rehabilitation Services Office: [phone] (Secure chat preferred)    Claudia Desanctis 08/30/2022, 4:03 PM

## 2022-08-30 NOTE — Progress Notes (Addendum)
PROGRESS NOTE    Anna Gamble  ZOX:[id number] DOB: 12-06-1935 DOA: 08/27/2022 PCP: [name], MD   Chief Complaint  Patient presents with   Fall    Brief Narrative:   Anna Gamble  is a 87 y.o. 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, ambulates with a walker and mainly wheelchair at home, she presents to ED secondary to progressive weakness, fall, she appears to be confused, unable to provide reliable history, so it was obtained with the husband at bedside, per husband patient has not been feeling right for the last 24 hours, poor appetite yesterday, more sleepy and drowsy, appears to be more confused, this morning she was going to the bathroom, where she had a fall, where she fell forward, into her knees bilaterally, she did not strike her head, she is not on any blood thinners, there is no loss of consciousness, no focal deficits, denies dysuria, cough, she denies fever but she reports some chills. -In ED lactic acid elevated at 2.5, improved to 2.1 after 2 L of fluids, UA is negative, she denies any urinary symptoms, chest x-ray with no acute cardiopulmonary finding, CT head with no acute intracranial findings, x-rays of bilateral knees, and right hand significant for severe osteoarthritis, given her SIRS finding, and encephalopathic   Assessment & Plan:   Principal Problem:   SIRS (systemic inflammatory response syndrome) (HCC) Active Problems:   HLD (hyperlipidemia)   Essential hypertension   GERD (gastroesophageal reflux disease)   CAD (coronary artery disease)   Severe aortic valve stenosis   S/P TAVR (transcatheter aortic valve replacement)   Type 2 diabetes mellitus with hyperglycemia, without long-term current use of insulin (HCC)      Sepsis, due to acute bacterial sinusitis -Patient presents with  altered mental status, low-grade temperature 99.3, lactic acidosis and leukocytosis -She was treated with broad-spectrum antibiotic, vancomycin, cefepime and Flagyl, blood cultures remain negative, MRSA PCR is negative, will DC vancomycin.  Septic workup remains negative so we will DC IV vancomycin and Flagyl. -Reports congestion, purulent discharge from her nose, headache upon laying supine, she is having clinical endings of acute bacterial sinusitis, in addition to IV Unasyn, she still reports headache, congestion, will obtain maxillofacial CT to evaluate her sinuses. - so far no source of infection could be identified, negative UA, chest x-ray with no evidence of cardiopulmonary disease, she has negative meningeal signs -Follow-up blood cultures, so far remains negative at day 2 -COVID-19 test is negative   Acute metabolic encephalopathy -CT head with no acute findings -Most likely related to infection -She has negative meningeal signs -Mentation back to baseline   Diabetes mellitus Will hold metformin specially with lactic acidosis -Will keep on insulin sliding scale   Hyperlipidemia -Her statin appears to be discontinued by her PCP,   Hypertension -Blood pressure soft, so we will hold losartan, hydrochlorothiazide and metoprolol for now   History of aortic stenosis status post TAVR -Will monitor on telemetry, follow-up blood cultures   Weakness/deconditioning - PT/OT, recommendation for NF   Obesity class 3 Body mass index is 42.41 kg/m.   GERD-continue with PPI  Hypokalemia -Repleted  Hyponatremia-continue with gentle hydration     DVT prophylaxis: Lovenox Code Status: Full Family Communication: Discussed with husband at bedside Disposition:   Status is: Inpatient    Consultants:  None Subjective:  Reports some headache, nasal congestion, purulent nasal  drainage  Objective: Vitals:   08/30/22 0000 08/30/22 0400 08/30/22 0800 08/30/22 1206  BP: (!)  125/56 137/61 120/72 (!) 127/58  Pulse: 88 85 82 80  Resp: 18 (!) 21 18 14   Temp: 98.6 F (37 C) 98 F (36.7 C)  97.7 F (36.5 C)  TempSrc: Oral Oral    SpO2: 93% 93% 93% 95%  Weight:      Height:        Intake/Output Summary (Last 24 hours) at 08/30/2022 1241 Last data filed at 08/29/2022 1800 Gross per 24 hour  Intake 0 ml  Output 800 ml  Net -800 ml   Filed Weights   08/27/22 1629  Weight: 101.8 kg    Examination:  Awake Alert, Oriented X 3, No new F.N deficits, Normal affect Symmetrical Chest wall movement, Good air movement bilaterally, CTAB RRR,No Gallops,Rubs or new Murmurs, No Parasternal Heave +ve B.Sounds, Abd Soft, No tenderness, No rebound - guarding or rigidity. No Cyanosis, Clubbing or edema, No new Rash or bruise        Data Reviewed: I have personally reviewed following labs and imaging studies  CBC: Recent Labs  Lab 08/27/22 0651 08/28/22 0857 08/29/22 0206 08/30/22 0255  WBC 12.0* 8.2 7.6 6.7  NEUTROABS 11.0*  --   --   --   HGB 12.5 12.8 11.3* 10.8*  HCT 37.0 37.2 33.1* 32.4*  MCV 93.0 94.9 94.8 93.1  PLT 171 164 155 174    Basic Metabolic Panel: Recent Labs  Lab 08/27/22 0651 08/28/22 0857 08/29/22 0206 08/30/22 0255  NA 132* 136 132* 136  K 3.7 3.7 3.4* 3.9  CL 97* 102 100 102  CO2 24 23 22 24   GLUCOSE 210* 117* 109* 135*  BUN 14 11 11 10   CREATININE 0.94 0.83 0.85 0.77  CALCIUM 8.6* 8.4* 8.3* 8.2*  MG 1.8  --   --   --     GFR: Estimated Creatinine Clearance: 56.3 mL/min (by C-G formula based on SCr of 0.77 mg/dL).  Liver Function Tests: Recent Labs  Lab 08/27/22 0651 08/28/22 0857  AST 25 30  ALT 27 28  ALKPHOS 59 60  BILITOT 1.7* 1.1  PROT 6.2* 6.0*  ALBUMIN 3.1* 2.8*    CBG: Recent Labs  Lab 08/29/22 1156 08/29/22 1616 08/29/22 2138 08/30/22 0749 08/30/22 1202  GLUCAP 163* 124* 145* 128* 126*     Recent Results (from the past 240 hour(s))  Resp panel by RT-PCR (RSV, Flu A&B, Covid) Anterior  Nasal Swab     Status: None   Collection Time: 08/27/22  7:55 AM   Specimen: Anterior Nasal Swab  Result Value Ref Range Status   SARS Coronavirus 2 by RT PCR NEGATIVE NEGATIVE Final   Influenza A by PCR NEGATIVE NEGATIVE Final   Influenza B by PCR NEGATIVE NEGATIVE Final    Comment: (NOTE) The Xpert Xpress SARS-CoV-2/FLU/RSV plus assay is intended as an aid in the diagnosis of influenza from Nasopharyngeal swab specimens and should not be used as a sole basis for treatment. Nasal washings and aspirates are unacceptable for Xpert Xpress SARS-CoV-2/FLU/RSV testing.  Fact Sheet for Patients: BloggerCourse.com  Fact Sheet for Healthcare Providers: SeriousBroker.it  This test is not yet approved or cleared by the [place] FDA and has been authorized for detection and/or diagnosis of SARS-CoV-2 by FDA under an Emergency Use Authorization (EUA). This EUA will remain in effect (meaning this test can be used) for the duration of the COVID-19 declaration under Section 564(b)(1) of  the Act, 21 U.S.C. section 360bbb-3(b)(1), unless the authorization is terminated or revoked.     Resp Syncytial Virus by PCR NEGATIVE NEGATIVE Final    Comment: (NOTE) Fact Sheet for Patients: BloggerCourse.com  Fact Sheet for Healthcare Providers: SeriousBroker.it  This test is not yet approved or cleared by the [place] FDA and has been authorized for detection and/or diagnosis of SARS-CoV-2 by FDA under an Emergency Use Authorization (EUA). This EUA will remain in effect (meaning this test can be used) for the duration of the COVID-19 declaration under Section 564(b)(1) of the Act, 21 U.S.C. section 360bbb-3(b)(1), unless the authorization is terminated or revoked.  Performed at [facility], [address], [place], [place] [address]   Culture, blood (Routine X 2) w Reflex to ID Panel      Status: None (Preliminary result)   Collection Time: 08/27/22  4:25 PM   Specimen: BLOOD RIGHT HAND  Result Value Ref Range Status   Specimen Description BLOOD RIGHT HAND  Final   Special Requests   Final    BOTTLES DRAWN AEROBIC AND ANAEROBIC Blood Culture results may not be optimal due to an inadequate volume of blood received in culture bottles   Culture   Final    NO GROWTH 3 DAYS Performed at [facility], [address] [place], [place] [address]    Report Status PENDING  Incomplete  Culture, blood (Routine X 2) w Reflex to ID Panel     Status: None (Preliminary result)   Collection Time: 08/27/22  4:35 PM   Specimen: BLOOD  Result Value Ref Range Status   Specimen Description BLOOD LEFT ANTECUBITAL  Final   Special Requests   Final    BOTTLES DRAWN AEROBIC AND ANAEROBIC Blood Culture results may not be optimal due to an inadequate volume of blood received in culture bottles   Culture   Final    NO GROWTH 3 DAYS Performed at [facility], [address], [place], [place] [address]    Report Status PENDING  Incomplete  MRSA Next Gen by PCR, Nasal     Status: None   Collection Time: 08/28/22  2:13 PM   Specimen: Nasal Mucosa; Nasal Swab  Result Value Ref Range Status   MRSA by PCR Next Gen NOT DETECTED NOT DETECTED Final    Comment: (NOTE) The GeneXpert MRSA Assay (FDA approved for NASAL specimens only), is one component of a comprehensive MRSA colonization surveillance program. It is not intended to diagnose MRSA infection nor to guide or monitor treatment for MRSA infections. Test performance is not FDA approved in patients less than 30 years old. Performed at [facility], [address], [place], [place] [address]          Radiology Studies: No results found.      Scheduled Meds:  aspirin EC  81 mg Oral Daily   enoxaparin (LOVENOX) injection  50 mg Subcutaneous Q24H   insulin aspart  0-15 Units Subcutaneous TID WC   insulin aspart   0-5 Units Subcutaneous QHS   Continuous Infusions:  ampicillin-sulbactam (UNASYN) IV 3 g (08/30/22 0421)     LOS: 3 days       Huey Bienenstock, MD [facility]   To contact the attending provider between 7A-7P or the covering provider during after hours 7P-7A, please log into the web site [email or web address] and access using universal [place] password for that web site. If you do not have the password, please  call the hospital operator.  08/30/2022, 12:41 PM

## 2022-08-31 ENCOUNTER — Encounter (HOSPITAL_COMMUNITY): Payer: Self-pay | Admitting: Physical Medicine and Rehabilitation

## 2022-08-31 ENCOUNTER — Other Ambulatory Visit: Payer: Self-pay

## 2022-08-31 ENCOUNTER — Inpatient Hospital Stay (HOSPITAL_COMMUNITY)
Admission: RE | Admit: 2022-08-31 | Discharge: 2022-09-08 | DRG: 945 | Disposition: A | Discharge status: Home / Self Care | Payer: Medicare Other | Source: Intra-hospital | Attending: Physical Medicine and Rehabilitation | Admitting: Physical Medicine and Rehabilitation

## 2022-08-31 DIAGNOSIS — I2510 Atherosclerotic heart disease of native coronary artery without angina pectoris: Secondary | ICD-10-CM | POA: Diagnosis present

## 2022-08-31 DIAGNOSIS — Z7984 Long term (current) use of oral hypoglycemic drugs: Secondary | ICD-10-CM

## 2022-08-31 DIAGNOSIS — E785 Hyperlipidemia, unspecified: Secondary | ICD-10-CM | POA: Diagnosis present

## 2022-08-31 DIAGNOSIS — J019 Acute sinusitis, unspecified: Secondary | ICD-10-CM | POA: Diagnosis present

## 2022-08-31 DIAGNOSIS — E872 Acidosis, unspecified: Secondary | ICD-10-CM | POA: Diagnosis present

## 2022-08-31 DIAGNOSIS — Z86010 Personal history of colonic polyps: Secondary | ICD-10-CM | POA: Diagnosis not present

## 2022-08-31 DIAGNOSIS — M7989 Other specified soft tissue disorders: Secondary | ICD-10-CM | POA: Diagnosis not present

## 2022-08-31 DIAGNOSIS — Z888 Allergy status to other drugs, medicaments and biological substances status: Secondary | ICD-10-CM | POA: Diagnosis not present

## 2022-08-31 DIAGNOSIS — Z96651 Presence of right artificial knee joint: Secondary | ICD-10-CM | POA: Diagnosis present

## 2022-08-31 DIAGNOSIS — E1165 Type 2 diabetes mellitus with hyperglycemia: Secondary | ICD-10-CM | POA: Diagnosis not present

## 2022-08-31 DIAGNOSIS — K219 Gastro-esophageal reflux disease without esophagitis: Secondary | ICD-10-CM | POA: Diagnosis present

## 2022-08-31 DIAGNOSIS — H04129 Dry eye syndrome of unspecified lacrimal gland: Secondary | ICD-10-CM | POA: Diagnosis present

## 2022-08-31 DIAGNOSIS — M25511 Pain in right shoulder: Secondary | ICD-10-CM | POA: Diagnosis present

## 2022-08-31 DIAGNOSIS — R6510 Systemic inflammatory response syndrome (SIRS) of non-infectious origin without acute organ dysfunction: Secondary | ICD-10-CM | POA: Diagnosis not present

## 2022-08-31 DIAGNOSIS — G9341 Metabolic encephalopathy: Secondary | ICD-10-CM | POA: Diagnosis not present

## 2022-08-31 DIAGNOSIS — Z952 Presence of prosthetic heart valve: Secondary | ICD-10-CM

## 2022-08-31 DIAGNOSIS — B9689 Other specified bacterial agents as the cause of diseases classified elsewhere: Secondary | ICD-10-CM | POA: Diagnosis present

## 2022-08-31 DIAGNOSIS — E119 Type 2 diabetes mellitus without complications: Secondary | ICD-10-CM | POA: Diagnosis present

## 2022-08-31 DIAGNOSIS — J329 Chronic sinusitis, unspecified: Secondary | ICD-10-CM

## 2022-08-31 DIAGNOSIS — Z8249 Family history of ischemic heart disease and other diseases of the circulatory system: Secondary | ICD-10-CM

## 2022-08-31 DIAGNOSIS — Z9049 Acquired absence of other specified parts of digestive tract: Secondary | ICD-10-CM | POA: Diagnosis not present

## 2022-08-31 DIAGNOSIS — R519 Headache, unspecified: Secondary | ICD-10-CM | POA: Diagnosis present

## 2022-08-31 DIAGNOSIS — Z823 Family history of stroke: Secondary | ICD-10-CM | POA: Diagnosis not present

## 2022-08-31 DIAGNOSIS — Z7982 Long term (current) use of aspirin: Secondary | ICD-10-CM | POA: Diagnosis not present

## 2022-08-31 DIAGNOSIS — K146 Glossodynia: Secondary | ICD-10-CM | POA: Diagnosis not present

## 2022-08-31 DIAGNOSIS — R5381 Other malaise: Principal | ICD-10-CM | POA: Diagnosis present

## 2022-08-31 DIAGNOSIS — Z79899 Other long term (current) drug therapy: Secondary | ICD-10-CM | POA: Diagnosis not present

## 2022-08-31 DIAGNOSIS — R600 Localized edema: Secondary | ICD-10-CM | POA: Diagnosis not present

## 2022-08-31 DIAGNOSIS — I10 Essential (primary) hypertension: Secondary | ICD-10-CM | POA: Diagnosis present

## 2022-08-31 DIAGNOSIS — Z6841 Body Mass Index (BMI) 40.0 and over, adult: Secondary | ICD-10-CM

## 2022-08-31 DIAGNOSIS — Z8261 Family history of arthritis: Secondary | ICD-10-CM | POA: Diagnosis not present

## 2022-08-31 DIAGNOSIS — R32 Unspecified urinary incontinence: Secondary | ICD-10-CM | POA: Diagnosis present

## 2022-08-31 DIAGNOSIS — Z794 Long term (current) use of insulin: Secondary | ICD-10-CM

## 2022-08-31 DIAGNOSIS — L24A0 Irritant contact dermatitis due to friction or contact with body fluids, unspecified: Secondary | ICD-10-CM | POA: Diagnosis not present

## 2022-08-31 LAB — GLUCOSE, CAPILLARY
Glucose-Capillary: 191 mg/dL — ABNORMAL HIGH (ref 70–99)
Glucose-Capillary: 113 mg/dL — ABNORMAL HIGH (ref 70–99)
Glucose-Capillary: 120 mg/dL — ABNORMAL HIGH (ref 70–99)
Glucose-Capillary: 143 mg/dL — ABNORMAL HIGH (ref 70–99)

## 2022-08-31 MED ORDER — INSULIN ASPART 100 UNIT/ML IJ SOLN: 0.0000 [IU] | Freq: Three times a day (TID) | INTRAMUSCULAR | Status: DC

## 2022-08-31 MED ORDER — SODIUM BICARBONATE 650 MG PO TABS
650.0000 mg | ORAL_TABLET | Freq: Every day | ORAL | Status: DC
  Administered 2022-08-31 – 2022-09-08 (×9): 650 mg via ORAL
  Filled 2022-08-31 (×9): qty 1

## 2022-08-31 MED ORDER — MUSCLE RUB 10-15 % EX CREA
TOPICAL_CREAM | Freq: Three times a day (TID) | CUTANEOUS | Status: DC
  Administered 2022-09-01: 1 via TOPICAL
  Filled 2022-08-31 (×2): qty 85

## 2022-08-31 MED ORDER — PROCHLORPERAZINE MALEATE 5 MG PO TABS: 5.0000 mg | ORAL_TABLET | Freq: Four times a day (QID) | ORAL | Status: DC | PRN

## 2022-08-31 MED ORDER — INSULIN ASPART 100 UNIT/ML IJ SOLN: 0.0000 [IU] | Freq: Every day | INTRAMUSCULAR | Status: DC

## 2022-08-31 MED ORDER — AMOXICILLIN-POT CLAVULANATE 875-125 MG PO TABS
1.0000 | ORAL_TABLET | Freq: Two times a day (BID) | ORAL | Status: DC
  Administered 2022-08-31: 1 via ORAL
  Filled 2022-08-31: qty 1

## 2022-08-31 MED ORDER — AMOXICILLIN-POT CLAVULANATE 875-125 MG PO TABS: 1.0000 | ORAL_TABLET | Freq: Two times a day (BID) | ORAL | Status: DC

## 2022-08-31 MED ORDER — ENOXAPARIN SODIUM 40 MG/0.4ML IJ SOSY
40.0000 mg | PREFILLED_SYRINGE | INTRAMUSCULAR | Status: DC
  Administered 2022-08-31 – 2022-09-07 (×8): 40 mg via SUBCUTANEOUS
  Filled 2022-08-31 (×8): qty 0.4

## 2022-08-31 MED ORDER — DIPHENHYDRAMINE HCL 25 MG PO CAPS: 25.0000 mg | ORAL_CAPSULE | Freq: Four times a day (QID) | ORAL | Status: DC | PRN

## 2022-08-31 MED ORDER — MELATONIN 5 MG PO TABS: 5.0000 mg | ORAL_TABLET | Freq: Every evening | ORAL | Status: DC | PRN

## 2022-08-31 MED ORDER — AMOXICILLIN-POT CLAVULANATE 875-125 MG PO TABS: 1.0000 | ORAL_TABLET | Freq: Two times a day (BID) | ORAL | 0 refills | Status: DC

## 2022-08-31 MED ORDER — MAGNESIUM HYDROXIDE 400 MG/5ML PO SUSP: 30.0000 mL | Freq: Every day | ORAL | Status: DC | PRN

## 2022-08-31 MED ORDER — GUAIFENESIN-DM 100-10 MG/5ML PO SYRP: 5.0000 mL | ORAL_SOLUTION | Freq: Four times a day (QID) | ORAL | Status: DC | PRN

## 2022-08-31 MED ORDER — ALUM & MAG HYDROXIDE-SIMETH 200-200-20 MG/5ML PO SUSP: 30.0000 mL | ORAL | Status: DC | PRN

## 2022-08-31 MED ORDER — PROSIGHT PO TABS
1.0000 | ORAL_TABLET | Freq: Every day | ORAL | Status: DC
  Administered 2022-09-01 – 2022-09-08 (×8): 1 via ORAL
  Filled 2022-08-31 (×8): qty 1

## 2022-08-31 MED ORDER — TROLAMINE SALICYLATE 10 % EX CREA: TOPICAL_CREAM | Freq: Three times a day (TID) | CUTANEOUS | Status: DC

## 2022-08-31 MED ORDER — FAMOTIDINE 20 MG PO TABS: 20.0000 mg | ORAL_TABLET | Freq: Two times a day (BID) | ORAL | Status: DC

## 2022-08-31 MED ORDER — PROCHLORPERAZINE EDISYLATE 10 MG/2ML IJ SOLN: 5.0000 mg | Freq: Four times a day (QID) | INTRAMUSCULAR | Status: DC | PRN

## 2022-08-31 MED ORDER — ACETAMINOPHEN 325 MG PO TABS
325.0000 mg | ORAL_TABLET | ORAL | Status: DC | PRN
  Administered 2022-09-02 – 2022-09-06 (×4): 650 mg via ORAL
  Filled 2022-08-31 (×4): qty 2

## 2022-08-31 MED ORDER — AMOXICILLIN-POT CLAVULANATE 875-125 MG PO TABS
1.0000 | ORAL_TABLET | Freq: Two times a day (BID) | ORAL | Status: AC
  Administered 2022-08-31 – 2022-09-03 (×6): 1 via ORAL
  Filled 2022-08-31 (×6): qty 1

## 2022-08-31 MED ORDER — PROCHLORPERAZINE 25 MG RE SUPP: 12.5000 mg | Freq: Four times a day (QID) | RECTAL | Status: DC | PRN

## 2022-08-31 MED ORDER — BISACODYL 10 MG RE SUPP: 10.0000 mg | Freq: Every day | RECTAL | Status: DC | PRN

## 2022-08-31 MED ORDER — ALBUTEROL SULFATE (2.5 MG/3ML) 0.083% IN NEBU: 2.5000 mg | INHALATION_SOLUTION | RESPIRATORY_TRACT | Status: DC | PRN

## 2022-08-31 MED ORDER — FLEET ENEMA RE ENEM: 1.0000 | ENEMA | Freq: Once | RECTAL | Status: DC | PRN

## 2022-08-31 MED ORDER — ASPIRIN 81 MG PO TBEC
81.0000 mg | DELAYED_RELEASE_TABLET | Freq: Every day | ORAL | Status: DC
  Administered 2022-09-01 – 2022-09-08 (×8): 81 mg via ORAL
  Filled 2022-08-31 (×8): qty 1

## 2022-08-31 NOTE — PMR Pre-admission (Signed)
PMR Admission Coordinator Pre-Admission Assessment  Patient: Anna Gamble is an 87 y.o., female MRN: [id number] DOB: [birth year] Height: 5\' 1"  (154.9 cm) Weight: 101.8 kg  Insurance Information HMO:     PPO:      PCP:      IPA:      80/20:      OTHER:  PRIMARY: Medicare a and b      Policy#: [id number]      Subscriber: pt Benefits:  Phone #: passport one source online     Name: 8/13 Eff. Date: 08/19/2002     Deduct: $1632      Out of Pocket Max: none      Life Max: none CIR: 100%      SNF: 20 full days Outpatient: 80%     Co-Pay: 20% Home Health: 100%      Co-Pay: none DME: 80%     Co-Pay: 20% Providers: pt choice  SECONDARY: AARP supplement      Policy#: [id number]  Financial Counselor:       Phone#:   The "Data Collection Information Summary" for patients in Inpatient Rehabilitation Facilities with attached "Privacy Act Statement-Health Care Records" was provided and verbally reviewed with: Patient and Family  Emergency Contact Information Contact Information     Name Relation Home Work Mobile   [place] [phone]  [phone]      Other Contacts   None on File    Current Medical History  Patient Admitting Diagnosis: Debility due to sepsis  History of Present Illness: 87 year old female with history of CAD, HTN, HLD, severe aortic stenosis s/p TAVR, type 2 DM, severe arthritis of Bilateral knees, who presented on 08/27/22 due to progressive weakness  and a fall. Mod I with RW in garden but typically uses her wheelchair in the home and the community due to severe knee arthritis.   Presented with altered mental status, low grade fever and lactic acidosis and leukocytosis. Sepsis due to acute bacterial sinusitis. Treated with broad spectrum antibiotics, Vancomycin, cefepime and Flagyl. Blood cultures negative. Reported congestion, purulent drainage from nose and headache. CT head with no acute findings. Negative meningeal signs. Mentation now back to  baseline. Holding metformin and on SSI. BP soft so held losartan, HCTZ and metoprolol. Lovenox for DVT prophylaxis.   Patient's medical record from [facility] has been reviewed by the rehabilitation admission coordinator and physician.  Past Medical History  Past Medical History:  Diagnosis Date   CAD (coronary artery disease) 03/19/2014   Colon polyp    Coronary artery disease    GERD (gastroesophageal reflux disease)    Hx of colonoscopy with polypectomy 07/14/2012   medoff    Hyperlipidemia    Hypertension    Impaired fasting glucose    Morbid obesity (HCC)    S/P TAVR (transcatheter aortic valve replacement)    Severe aortic stenosis    Has the patient had major surgery during 100 days prior to admission? No  Family History   family history includes Aortic stenosis in her sister; Arthritis in her sister; Coronary artery disease in some other family members; Hearing loss in her son; Heart Problems in her brother; Heart attack in her brother, father, and mother; Hyperlipidemia in an other family member; Hypertension in an other family member; Osteoarthritis in her sister; Rheum arthritis in her sister; Sleep apnea in her son; Stroke in her brother and brother.  Current Medications  Current Facility-Administered Medications:    acetaminophen (TYLENOL) tablet  650 mg, 650 mg, Oral, Q6H PRN, 650 mg at 08/30/22 1649 **OR** acetaminophen (TYLENOL) suppository 650 mg, 650 mg, Rectal, Q6H PRN, Elgergawy, Dawood S, MD   albuterol (PROVENTIL) (2.5 MG/3ML) 0.083% nebulizer solution 2.5 mg, 2.5 mg, Nebulization, Q2H PRN, Elgergawy, Leana Roe, MD   Ampicillin-Sulbactam (UNASYN) 3 g in sodium chloride 0.9 % 100 mL IVPB, 3 g, Intravenous, Q8H, Elgergawy, Dawood S, MD, Last Rate: 200 mL/hr at 08/31/22 0435, 3 g at 08/31/22 0435   aspirin EC tablet 81 mg, 81 mg, Oral, Daily, Elgergawy, Leana Roe, MD, 81 mg at 08/31/22 0941   enoxaparin (LOVENOX) injection 50 mg, 50 mg, Subcutaneous, Q24H,  Elgergawy, Leana Roe, MD, 50 mg at 08/31/22 0942   hydrALAZINE (APRESOLINE) injection 5 mg, 5 mg, Intravenous, Q4H PRN, Elgergawy, Leana Roe, MD   insulin aspart (novoLOG) injection 0-15 Units, 0-15 Units, Subcutaneous, TID WC, Elgergawy, Leana Roe, MD, 3 Units at 08/31/22 0942   insulin aspart (novoLOG) injection 0-5 Units, 0-5 Units, Subcutaneous, QHS, Elgergawy, Leana Roe, MD  Patients Current Diet:  Diet Order             Diet heart healthy/carb modified Room service appropriate? Yes; Fluid consistency: Thin  Diet effective now                  Precautions / Restrictions Precautions Precautions: Fall Restrictions Weight Bearing Restrictions: No   Has the patient had 2 or more falls or a fall with injury in the past year? Yes  Prior Activity Level Limited Community (1-2x/wk): Mod I with RW; uses wheelchair in home due to efficiency; uses RW in garden  Prior Functional Level Self Care: Did the patient need help bathing, dressing, using the toilet or eating? Independent  Indoor Mobility: Did the patient need assistance with walking from room to room (with or without device)? Independent  Stairs: Did the patient need assistance with internal or external stairs (with or without device)? Independent  Functional Cognition: Did the patient need help planning regular tasks such as shopping or remembering to take medications? Needed some help  Patient Information Are you of Hispanic, Latino/a,or Spanish origin?: A. No, not of Hispanic, Latino/a, or Spanish origin What is your race?: A. White Do you need or want an interpreter to communicate with a doctor or health care staff?: 0. No  Patient's Response To:  Health Literacy and Transportation Is the patient able to respond to health literacy and transportation needs?: Yes Health Literacy - How often do you need to have someone help you when you read instructions, pamphlets, or other written material from your doctor or pharmacy?:  Never In the past 12 months, has lack of transportation kept you from medical appointments or from getting medications?: No In the past 12 months, has lack of transportation kept you from meetings, work, or from getting things needed for daily living?: No  Journalist, newspaper / Corporate investment banker Devices/Equipment: Wheelchair Home Equipment: Shower seat - built in, Baird - single point, Rollator (4 wheels), Wheelchair - manual, Other (comment) (adjustable bed (hob and foot))  Prior Device Use: Indicate devices/aids used by the patient prior to current illness, exacerbation or injury? Manual wheelchair and Walker  Current Functional Level Cognition  Overall Cognitive Status: Within Functional Limits for tasks assessed Orientation Level: Oriented X4 Safety/Judgement: Decreased awareness of safety General Comments: OVerall WFL for simple tasks this date, would benefit from higer level assessment    Extremity Assessment (includes Sensation/Coordination)  Upper Extremity Assessment: Generalized weakness  Lower Extremity Assessment: Defer to PT evaluation RLE Deficits / Details: noted increased pain in R knee impacting MMT will continue to assess. no knee buckling noted with standing, but unable to do more than 1-2 LAQs with PT in sitting.    ADLs  Overall ADL's : Needs assistance/impaired Eating/Feeding: Independent Grooming: Set up, Sitting Upper Body Bathing: Set up, Sitting Lower Body Bathing: Moderate assistance, Sit to/from stand Upper Body Dressing : Set up, Sitting Lower Body Dressing: Moderate assistance, Sit to/from stand Lower Body Dressing Details (indicate cue type and reason): assist to don briefs Toilet Transfer: Minimal assistance, Ambulation, Rolling walker (2 wheels) Toilet Transfer Details (indicate cue type and reason): continues to be limited to pivotal stepping only Toileting- Clothing Manipulation and Hygiene: Moderate assistance, Sit to/from  stand Functional mobility during ADLs: Minimal assistance, Cueing for safety, Rolling walker (2 wheels) General ADL Comments: cues for safety, limitd by activity tolerance and fatigue from prior PT session    Mobility  Overal bed mobility: Needs Assistance Bed Mobility: Supine to Sit, Sit to Supine Supine to sit: Min assist Sit to supine: Min assist General bed mobility comments: Min A at trunk and extra time for scooting with supine to sitting and Min A for bil LE with sitting to supine.    Transfers  Overall transfer level: Needs assistance Equipment used: Rolling walker (2 wheels) Transfers: Sit to/from Stand, Bed to chair/wheelchair/BSC Sit to Stand: Min assist Bed to/from chair/wheelchair/BSC transfer type:: Step pivot Step pivot transfers: Min assist General transfer comment: min A from low surface with occasional verbal cues for hand placement 4 for transfers and then 3x in a row with focus on safety and hand placement. Progressed to CGA    Ambulation / Gait / Stairs / Psychologist, prison and probation services  Ambulation/Gait Ambulation/Gait assistance: Editor, commissioning (Feet): 15 Feet (4x) Assistive device: Rolling walker (2 wheels) Gait Pattern/deviations: Step-through pattern, Decreased step length - right, Decreased step length - left, Antalgic General Gait Details: improved step length, partial step through, improve AD management this session Gait velocity: decreased cadence Gait velocity interpretation: <1.31 ft/sec, indicative of household Actuary mobility: Yes Wheelchair propulsion: Both lower extermities, Both upper extremities Wheelchair parts: Needs assistance Distance: 50 ft with Min A for navigation and to get movement. Prior pt was Mod I for W/C mobility    Posture / Balance Balance Overall balance assessment: Needs assistance Sitting-balance support: Feet supported, No upper extremity supported Sitting balance-Leahy Scale: Good Standing  balance support: Reliant on assistive device for balance, Bilateral upper extremity supported Standing balance-Leahy Scale: Fair Standing balance comment: needs min assist in standing with heavy reliance on AD for support.    Special needs/care consideration Fall precautions   Previous Home Environment  Living Arrangements: Spouse/significant other Available Help at Discharge: Family, Available PRN/intermittently Type of Home: House Home Layout: One level Home Access: Ramped entrance Bathroom Shower/Tub: Health visitor: Handicapped height Bathroom Accessibility: Yes How Accessible: Accessible via wheelchair Home Care Services: No Additional Comments: pt's son lives with her and husband, pt is a paraplegic does not require assistance  Discharge Living Setting Plans for Discharge Living Setting: Patient's home, Lives with (comment) (spouse and son) Type of Home at Discharge: House Discharge Home Layout: One level Discharge Home Access: Ramped entrance Discharge Bathroom Shower/Tub: Walk-in shower Discharge Bathroom Toilet: Handicapped height Discharge Bathroom Accessibility: Yes How Accessible: Accessible via wheelchair, Accessible via walker Does the patient have any problems obtaining your medications?: No  Social/Family/Support Systems  Patient Roles: Spouse, Parent Contact Information: spouse, Genevie Cheshire Anticipated Caregiver: spouse Anticipated Caregiver's Contact Information: see contacts Ability/Limitations of Caregiver: none Caregiver Availability: 24/7 Discharge Plan Discussed with Primary Caregiver: Yes Is Caregiver In Agreement with Plan?: Yes Does Caregiver/Family have Issues with Lodging/Transportation while Pt is in Rehab?: No  Goals Patient/Family Goal for Rehab: supervision with PT and OT Expected length of stay: ELOS 7 to 10 days Additional Information: Home is totally handicapped accessible for son who is a paraplegic Pt/Family Agrees to  Admission and willing to participate: Yes Program Orientation Provided & Reviewed with Pt/Caregiver Including Roles  & Responsibilities: Yes Additional Information Needs: Patient wants to d/c as soon as possible  Decrease burden of Care through IP rehab admission: n/a  Possible need for SNF placement upon discharge: not anticipated; p[patient refuses  Patient Condition: I have reviewed medical records from [facility], spoken with SW, and patient and spouse. I met with patient at the bedside for inpatient rehabilitation assessment.  Patient will benefit from ongoing PT and OT, can actively participate in 3 hours of therapy a day 5 days of the week, and can make measurable gains during the admission.  Patient will also benefit from the coordinated team approach during an Inpatient Acute Rehabilitation admission.  The patient will receive intensive therapy as well as Rehabilitation physician, nursing, social worker, and care management interventions.  Due to bladder management, bowel management, safety, skin/wound care, disease management, medication administration, pain management, and patient education the patient requires 24 hour a day rehabilitation nursing.  The patient is currently min assist overall with mobility and basic ADLs.  Discharge setting and therapy post discharge at home with home health is anticipated.  Patient has agreed to participate in the Acute Inpatient Rehabilitation Program and will admit today.  Preadmission Screen Completed By:  Clois Dupes, RN MSN 08/31/2022 12:01 PM ______________________________________________________________________   Discussed status with Dr. Riley Kill on 08/31/22 at 1210 and received approval for admission today.  Admission Coordinator:  Clois Dupes, RN MSN time 1210 Date 08/31/22   Assessment/Plan: Diagnosis: Debility related sepsis and multiple medical issues Does the need for close, 24 hr/day Medical supervision in  concert with the patient's rehab needs make it unreasonable for this patient to be served in a less intensive setting? Yes Co-Morbidities requiring supervision/potential complications: CAD, HTN, severe AS, DM, severe OA of both knees Due to bladder management, bowel management, safety, skin/wound care, disease management, medication administration, pain management, and patient education, does the patient require 24 hr/day rehab nursing? Yes Does the patient require coordinated care of a physician, rehab nurse, PT, OT, and SLP to address physical and functional deficits in the context of the above medical diagnosis(es)? Yes Addressing deficits in the following areas: balance, endurance, locomotion, strength, transferring, bowel/bladder control, bathing, dressing, feeding, grooming, toileting, and psychosocial support Can the patient actively participate in an intensive therapy program of at least 3 hrs of therapy 5 days a week? Yes The potential for patient to make measurable gains while on inpatient rehab is excellent Anticipated functional outcomes upon discharge from inpatient rehab: supervision PT, supervision OT, n/a SLP Estimated rehab length of stay to reach the above functional goals is: 7-10 days Anticipated discharge destination: Home 10. Overall Rehab/Functional Prognosis: excellent   MD Signature: Ranelle Oyster, MD, [facility] Physical Medicine & Rehabilitation Medical Director Rehabilitation Services 08/31/2022

## 2022-08-31 NOTE — Discharge Instructions (Signed)
Management per CIR 

## 2022-08-31 NOTE — Progress Notes (Signed)
Inpatient Rehabilitation Admission Medication Review by a Pharmacist  A complete drug regimen review was completed for this patient to identify any potential clinically significant medication issues.  High Risk Drug Classes Is patient taking? Indication by Medication  Antipsychotic Yes Compazine prn N/V  Anticoagulant Yes Lovenox - VTE ppx  Antibiotic No   Opioid No   Antiplatelet No   Hypoglycemics/insulin Yes Insulin - DM  Vasoactive Medication No   Chemotherapy No   Other Yes Albuterol prn SOB     Type of Medication Issue Identified Description of Issue Recommendation(s)  Drug Interaction(s) (clinically significant)     Duplicate Therapy     Allergy     No Medication Administration End Date     Incorrect Dose     Additional Drug Therapy Needed     Significant med changes from prior encounter (inform family/care partners about these prior to discharge).    Other       Clinically significant medication issues were identified that warrant physician communication and completion of prescribed/recommended actions by midnight of the next day:  No  Pharmacist comments:  Augmentin x 3 additional days  Time spent performing this drug regimen review (minutes):  20 minutes  Thank you Okey Regal, PharmD

## 2022-08-31 NOTE — H&P (Signed)
Physical Medicine and Rehabilitation Admission H&P        Chief Complaint  Patient presents with   Functional deficits due to debility       HPI:  Anna Gamble is an 87 year old female with history of HTN, morbid obesity- BMI 41, aortic stenosis s/p TVAR, T2DM, severe OA bilateral knees who was admitted on 08/27/22 with reports of progressive weakness, confusion with fall, lethargy and reports of chills. She was found to have SIRS with encephalopathy, low grade fever with WBC-12.0  and lactic acidosis.  COVID-19 test negative.  CT head negative. She was started on IV antibiotics and IVF. UA negative. CXR  negative. BC X 2 pending but no growth X 4 days.  Metformin held with insulin sliding scale for blood sugar management.  Blood pressures noted to be soft therefore BP meds held.  Lactic acidosis and leukocytosis has resolved.    Maxillofacial CT done due to reports of facial pain with headaches as well as nasal drainage and was negative for sinusitis.  Lethargy has resolved and PT OT has been working with patient who is noted to be deconditioned and required min assist overall. Prior to admission patient was modified independent and was gardening with use of upright rolling walker.  Used wheelchair in home to do housework/get around (too slow). CIR recommended due to functional decline.     Review of Systems  Eyes:  Positive for photophobia (right eye).  Respiratory:  Positive for cough.   Cardiovascular:  Negative for chest pain and palpitations.  Gastrointestinal:  Positive for abdominal pain (stomach is real bad/can't eat anything/bloating.) and heartburn.  Musculoskeletal:  Positive for myalgias.  Neurological:  Positive for sensory change (RLE numbness since TKR/gives away) and headaches.  Psychiatric/Behavioral:  The patient is nervous/anxious.           Past Medical History:  Diagnosis Date   CAD (coronary artery disease) 03/19/2014   Colon polyp     Coronary artery disease      GERD (gastroesophageal reflux disease)     Hx of colonoscopy with polypectomy 07/14/2012    medoff    Hyperlipidemia     Hypertension     Impaired fasting glucose     Morbid obesity (HCC)     S/P TAVR (transcatheter aortic valve replacement)     Severe aortic stenosis                 Past Surgical History:  Procedure Laterality Date   APPENDECTOMY       CHOLECYSTECTOMY       FOOT SURGERY        rt   KNEE SURGERY        rt   LEFT AND RIGHT HEART CATHETERIZATION WITH CORONARY ANGIOGRAM N/A 10/10/2013    Procedure: LEFT AND RIGHT HEART CATHETERIZATION WITH CORONARY ANGIOGRAM;  Surgeon: Kathleene Hazel, MD;  Location: [facility] CATH LAB;  Service: Cardiovascular;  Laterality: N/A;   RIGHT/LEFT HEART CATH AND CORONARY ANGIOGRAPHY N/A 04/06/2018    Procedure: RIGHT/LEFT HEART CATH AND CORONARY ANGIOGRAPHY;  Surgeon: Kathleene Hazel, MD;  Location: [facility] INVASIVE CV LAB;  Service: Cardiovascular;  Laterality: N/A;   TEE WITHOUT CARDIOVERSION N/A 06/06/2018    Procedure: TRANSESOPHAGEAL ECHOCARDIOGRAM (TEE);  Surgeon: Kathleene Hazel, MD;  Location: [facility] OR;  Service: Open Heart Surgery;  Laterality: N/A;   TONSILLECTOMY       TOTAL KNEE ARTHROPLASTY   02/01/2011    Procedure: TOTAL KNEE ARTHROPLASTY;  Surgeon:  Nestor Lewandowsky;  Location: [facility] OR;  Service: Orthopedics;  Laterality: Right;  DEPUY/SIGMA   TRANSCATHETER AORTIC VALVE REPLACEMENT, TRANSFEMORAL N/A 06/06/2018    Procedure: TRANSCATHETER AORTIC VALVE REPLACEMENT, TRANSFEMORAL;  Surgeon: Kathleene Hazel, MD;  Location: [facility] OR;  Service: Open Heart Surgery;  Laterality: N/A;               Family History  Problem Relation Age of Onset   Coronary artery disease Other          female- 1st degree relative   Coronary artery disease Other          female- 1st degree relative   Hypertension Other     Hyperlipidemia Other     Heart attack Mother          age 59's   Heart attack Father          age 75's   Stroke  Brother          died    Hearing loss Son     Sleep apnea Son     Aortic stenosis Sister     Stroke Brother     Heart attack Brother     Rheum arthritis Sister     Osteoarthritis Sister     Arthritis Sister     Heart Problems Brother            Social History:  Married. 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 She has never used smokeless tobacco. She reports that she does not drink alcohol and does not use drugs.     Allergies       Allergies  Allergen Reactions   Lisinopril Cough   Statins Other (See Comments)      muscle aches with some statins, tolerates low doses of crestor   Semaglutide Other (See Comments)      Took rybelsus tab. Cause her to have abdominal pain.               Medications Prior to Admission  Medication Sig Dispense Refill   amoxicillin (AMOXIL) 500 MG capsule Take 2,000 mg by mouth See admin instructions. 2000 mg prior to dentist appointment       aspirin 81 MG tablet Take 81 mg by mouth daily.       hydrochlorothiazide (MICROZIDE) 12.5 MG capsule TAKE 1 CAPSULE BY MOUTH DAILY 90 capsule 3   losartan (COZAAR) 25 MG tablet TAKE 2 TABLETS BY MOUTH DAILY (Patient taking differently: Take 50 mg by mouth daily.) 180 tablet 3   metFORMIN (GLUCOPHAGE) 500 MG tablet TAKE 1 TABLET BY MOUTH DAILY (Patient taking differently: Take 500 mg by mouth in the morning and at bedtime.) 90 tablet 3   metoprolol succinate (TOPROL-XL) 50 MG 24 hr tablet TAKE 1 TABLET BY MOUTH DAILY 30 tablet 0   Misc Natural Products (TART CHERRY ADVANCED PO) Take 1 capsule by mouth daily.        Multiple Vitamins-Minerals (MULTIVITAMIN WITH MINERALS) tablet Take 1 tablet by mouth daily.       Multiple Vitamins-Minerals (PRESERVISION AREDS 2+MULTI VIT PO) Take 1 tablet by mouth daily.       Polyethyl Glycol-Propyl Glycol (SYSTANE OP) Place 2 drops into both eyes in the morning, at noon, and at bedtime.       Lancets (ACCU-CHEK SOFT  TOUCH) lancets Use as instructed 100 each 12   rosuvastatin (CRESTOR) 5 MG tablet Take 1 tablet (  5 mg total) by mouth daily. (Patient not taking: Reported on 08/27/2022) 90 tablet 1   Semaglutide (OZEMPIC, 0.25 OR 0.5 MG/DOSE, [place]) Inject 0.25 mg into the skin once a week. (Patient not taking: Reported on 08/03/2022)              Home: Home Living Family/patient expects to be discharged to:: Private residence Living Arrangements: Spouse/significant other Available Help at Discharge: Family, Available PRN/intermittently Type of Home: House Home Access: Ramped entrance Home Layout: One level Bathroom Shower/Tub: Health visitor: Handicapped height Bathroom Accessibility: Yes Home Equipment: Information systems manager - built in, [place] - single point, Rollator (4 wheels), Wheelchair - manual, Other (comment) (adjustable bed (hob and foot)) Additional Comments: pt's son lives with her and husband, pt is a paraplegic does not require assistance   Functional History: Prior Function Prior Level of Function : Independent/Modified Independent Mobility Comments: modified independent at w/c level. Pt reports stand pivot transfers to w/c ADLs Comments: independent with ADL, independent with cooking and medication, husband does the driving,   Functional Status:  Mobility: Bed Mobility Overal bed mobility: Needs Assistance Bed Mobility: Supine to Sit, Sit to Supine Supine to sit: Min assist Sit to supine: Min assist General bed mobility comments: Min A at trunk and extra time for scooting with supine to sitting and Min A for bil LE with sitting to supine. Transfers Overall transfer level: Needs assistance Equipment used: Rolling walker (2 wheels) Transfers: Sit to/from Stand, Bed to chair/wheelchair/BSC Sit to Stand: Min assist Bed to/from chair/wheelchair/BSC transfer type:: Step pivot Step pivot transfers: Min assist General transfer comment: min A from low surface with occasional verbal  cues for hand placement 4 for transfers and then 3x in a row with focus on safety and hand placement. Progressed to CGA Ambulation/Gait Ambulation/Gait assistance: Min assist Gait Distance (Feet): 15 Feet (4x) Assistive device: Rolling walker (2 wheels) Gait Pattern/deviations: Step-through pattern, Decreased step length - right, Decreased step length - left, Antalgic General Gait Details: improved step length, partial step through, improve AD management this session Gait velocity: decreased cadence Gait velocity interpretation: <1.31 ft/sec, indicative of household Actuary mobility: Yes Wheelchair propulsion: Both lower extermities, Both upper extremities Wheelchair parts: Needs assistance Distance: 50 ft with Min A for navigation and to get movement. Prior pt was Mod I for W/C mobility   ADL: ADL Overall ADL's : Needs assistance/impaired Eating/Feeding: Independent Grooming: Set up, Sitting Upper Body Bathing: Set up, Sitting Lower Body Bathing: Moderate assistance, Sit to/from stand Upper Body Dressing : Set up, Sitting Lower Body Dressing: Moderate assistance, Sit to/from stand Lower Body Dressing Details (indicate cue type and reason): assist to don briefs Toilet Transfer: Minimal assistance, Ambulation, Rolling walker (2 wheels) Toilet Transfer Details (indicate cue type and reason): continues to be limited to pivotal stepping only Toileting- Clothing Manipulation and Hygiene: Moderate assistance, Sit to/from stand Functional mobility during ADLs: Minimal assistance, Cueing for safety, Rolling walker (2 wheels) General ADL Comments: cues for safety, limitd by activity tolerance and fatigue from prior PT session   Cognition: Cognition Overall Cognitive Status: Within Functional Limits for tasks assessed Orientation Level: Oriented X4 Cognition Arousal: Alert Behavior During Therapy: WFL for tasks assessed/performed Overall Cognitive Status:  Within Functional Limits for tasks assessed Area of Impairment: Safety/judgement Memory: Decreased short-term memory Safety/Judgement: Decreased awareness of safety General Comments: OVerall WFL for simple tasks this date, would benefit from higer level assessment     Blood pressure (!) 129/55, pulse 86,  temperature 98.2 F (36.8 C), temperature source Oral, resp. rate 18, height 5\' 1"  (1.549 m), weight 101.8 kg, SpO2 96%. Physical Exam Vitals and nursing note reviewed.  Constitutional:      Appearance: She is obese.     Comments: Obese female in NAD. Tends to keep right eye closed. (Dry eyes/light sensitivity)  HENT:     Head: Normocephalic.     Right Ear: External ear normal.     Left Ear: External ear normal.     Nose: Nose normal.     Mouth/Throat:     Mouth: Mucous membranes are moist.  Eyes:     Extraocular Movements: Extraocular movements intact.     Pupils: Pupils are equal, round, and reactive to light.  Cardiovascular:     Rate and Rhythm: Normal rate and regular rhythm.     Heart sounds: No murmur heard.    No gallop.  Pulmonary:     Effort: Pulmonary effort is normal. No respiratory distress.     Breath sounds: No wheezing.  Abdominal:     General: Bowel sounds are normal. There is no distension.     Tenderness: There is no abdominal tenderness.  Musculoskeletal:     Cervical back: Normal range of motion.     Comments: Right shoulder with limited IR/ER d/t pain. Right knee with TKA scar and limitations in flexion.   Skin:    General: Skin is warm and dry.  Neurological:     Mental Status: She is alert.     Comments: Alert and oriented x 3. Normal insight and awareness. Intact Memory. Normal language and speech. Cranial nerve exam unremarkable. MMT: RUE limited by shoulder pain but otherwise 4/5. LUE grossly 5/5. RLE 4/5 prox to distal. LLE 4+/5. No focal sensory abnl. No abnl tone.    Psychiatric:        Mood and Affect: Mood normal.        Behavior: Behavior  normal.        Lab Results Last 48 Hours        Results for orders placed or performed during the hospital encounter of 08/27/22 (from the past 48 hour(s))  Glucose, capillary     Status: Abnormal    Collection Time: 08/29/22 11:56 AM  Result Value Ref Range    Glucose-Capillary 163 (H) 70 - 99 mg/dL      Comment: Glucose reference range applies only to samples taken after fasting for at least 8 hours.  Glucose, capillary     Status: Abnormal    Collection Time: 08/29/22  4:16 PM  Result Value Ref Range    Glucose-Capillary 124 (H) 70 - 99 mg/dL      Comment: Glucose reference range applies only to samples taken after fasting for at least 8 hours.  Glucose, capillary     Status: Abnormal    Collection Time: 08/29/22  9:38 PM  Result Value Ref Range    Glucose-Capillary 145 (H) 70 - 99 mg/dL      Comment: Glucose reference range applies only to samples taken after fasting for at least 8 hours.  CBC     Status: Abnormal    Collection Time: 08/30/22  2:55 AM  Result Value Ref Range    WBC 6.7 4.0 - 10.5 K/uL    RBC 3.48 (L) 3.87 - 5.11 MIL/uL    Hemoglobin 10.8 (L) 12.0 - 15.0 g/dL    HCT 16.1 (L) 09.6 - 46.0 %    MCV 93.1 80.0 -  100.0 fL    MCH 31.0 26.0 - 34.0 pg    MCHC 33.3 30.0 - 36.0 g/dL    RDW 69.6 29.5 - 28.4 %    Platelets 174 150 - 400 K/uL    nRBC 0.0 0.0 - 0.2 %      Comment: Performed at [facility], [address] [place], [place] [address]  Basic metabolic panel     Status: Abnormal    Collection Time: 08/30/22  2:55 AM  Result Value Ref Range    Sodium 136 135 - 145 mmol/L    Potassium 3.9 3.5 - 5.1 mmol/L    Chloride 102 98 - 111 mmol/L    CO2 24 22 - 32 mmol/L    Glucose, Bld 135 (H) 70 - 99 mg/dL      Comment: Glucose reference range applies only to samples taken after fasting for at least 8 hours.    BUN 10 8 - 23 mg/dL    Creatinine, Ser 0.10 0.44 - 1.00 mg/dL    Calcium 8.2 (L) 8.9 - 10.3 mg/dL    GFR, Estimated >27 >25 mL/min      Comment:  (NOTE) Calculated using the CKD-EPI Creatinine Equation (2021)      Anion gap 10 5 - 15      Comment: Performed at [facility], [address] [place], [place] [address]  Glucose, capillary     Status: Abnormal    Collection Time: 08/30/22  7:49 AM  Result Value Ref Range    Glucose-Capillary 128 (H) 70 - 99 mg/dL      Comment: Glucose reference range applies only to samples taken after fasting for at least 8 hours.  Glucose, capillary     Status: Abnormal    Collection Time: 08/30/22 12:02 PM  Result Value Ref Range    Glucose-Capillary 126 (H) 70 - 99 mg/dL      Comment: Glucose reference range applies only to samples taken after fasting for at least 8 hours.  Glucose, capillary     Status: Abnormal    Collection Time: 08/30/22  4:44 PM  Result Value Ref Range    Glucose-Capillary 116 (H) 70 - 99 mg/dL      Comment: Glucose reference range applies only to samples taken after fasting for at least 8 hours.  Glucose, capillary     Status: Abnormal    Collection Time: 08/30/22  9:36 PM  Result Value Ref Range    Glucose-Capillary 125 (H) 70 - 99 mg/dL      Comment: Glucose reference range applies only to samples taken after fasting for at least 8 hours.  CBC     Status: Abnormal    Collection Time: 08/31/22  6:30 AM  Result Value Ref Range    WBC 8.1 4.0 - 10.5 K/uL    RBC 3.62 (L) 3.87 - 5.11 MIL/uL    Hemoglobin 11.3 (L) 12.0 - 15.0 g/dL    HCT 03.4 (L) 74.2 - 46.0 %    MCV 93.4 80.0 - 100.0 fL    MCH 31.2 26.0 - 34.0 pg    MCHC 33.4 30.0 - 36.0 g/dL    RDW 59.5 63.8 - 75.6 %    Platelets 215 150 - 400 K/uL    nRBC 0.0 0.0 - 0.2 %      Comment: Performed at [facility], [address] [place], [place] [address]  Basic metabolic panel     Status: Abnormal  Collection Time: 08/31/22  6:30 AM  Result Value Ref Range    Sodium 138 135 - 145 mmol/L    Potassium 3.6 3.5 - 5.1 mmol/L    Chloride 103 98 - 111 mmol/L    CO2 26 22 - 32 mmol/L    Glucose, Bld 134  (H) 70 - 99 mg/dL      Comment: Glucose reference range applies only to samples taken after fasting for at least 8 hours.    BUN 7 (L) 8 - 23 mg/dL    Creatinine, Ser 4.09 0.44 - 1.00 mg/dL    Calcium 8.2 (L) 8.9 - 10.3 mg/dL    GFR, Estimated >81 >19 mL/min      Comment: (NOTE) Calculated using the CKD-EPI Creatinine Equation (2021)      Anion gap 9 5 - 15      Comment: Performed at [facility], [address], [place], [place] [address]  Glucose, capillary     Status: Abnormal    Collection Time: 08/31/22  8:12 AM  Result Value Ref Range    Glucose-Capillary 191 (H) 70 - 99 mg/dL      Comment: Glucose reference range applies only to samples taken after fasting for at least 8 hours.       Imaging Results (Last 48 hours)  CT Maxillofacial LTD WO CM   Result Date: 08/31/2022 CLINICAL DATA:  Initial evaluation for sinusitis. EXAM: CT PARANASAL SINUS LIMITED WITHOUT CONTRAST TECHNIQUE: Multidetector CT images of the paranasal sinuses were obtained using the standard protocol without intravenous contrast. RADIATION DOSE REDUCTION: This exam was performed according to the departmental dose-optimization program which includes automated exposure control, adjustment of the mA and/or kV according to patient size and/or use of iterative reconstruction technique. COMPARISON:  None Available. FINDINGS: Paranasal sinuses: Frontal: Normally aerated. Patent frontal sinus drainage pathways. Ethmoid: Mild mucoperiosteal thickening present about the ethmoidal air cells bilaterally. No significant sinus opacification. Maxillary: Maxillary sinuses are clear. Osseous thickening and sclerosis at the floor of the right maxillary sinus likely related to underlying chronic dental disease. Sphenoid: Normally aerated. Patent sphenoethmoidal recesses. Right ostiomeatal unit: Patent. Left ostiomeatal unit: Patent. Nasal passages: Patent. Sigmoid nasal septal deviation. No concha bullosa. Anatomy: No pneumatization  superior to anterior ethmoid notches. Symmetric and intact olfactory grooves and fovea ethmoidalis, Keros II (4-76mm). Sellar sphenoid pneumatization pattern. No dehiscence of carotid or optic canals. No onodi cell. Other: Visualized intracranial contents within normal limits. Mastoid air cells and middle ear cavities are well pneumatized and free of fluid. Globes and orbital soft tissues within normal limits. Sequelae of prior bilateral ocular lens replacement. Remainder of the visualized facial soft tissues demonstrate no significant finding. IMPRESSION: Clear sinuses with patent drainage pathways. No CT evidence for acute sinusitis at this time. Electronically Signed   By: Rise Mu M.D.   On: 08/31/2022 04:45           Blood pressure (!) 129/55, pulse 86, temperature 98.2 F (36.8 C), temperature source Oral, resp. rate 18, height 5\' 1"  (1.549 m), weight 101.8 kg, SpO2 96%.   Medical Problem List and Plan: 1. Functional deficits secondary to debility and encephalopathy after SIRS/bacterial sinusitis/sepsis             -patient may shower             -ELOS/Goals: 7-10 days, supervision goals with PT and OT 2.  Antithrombotics: -DVT/anticoagulation:  Pharmaceutical: Lovenox             -  antiplatelet therapy: ASA 3.  OA bilateral knees,(hx of right TKA), pain Management: Tylenol as needed.  For therapy as needed. 4. Mood/Behavior/Sleep: LCSW to call for evaluation and support.             -- Melatonin as needed for insomnia             -antipsychotic agents:  N/A 5. Neuropsych/cognition: This patient is capable of making decisions on her own behalf. 6. Skin/Wound Care: Routine pressure-relief measures. 7. Fluids/Electrolytes/Nutrition: Monitor I's/O.  Check c-Met. 8. SIRS w/ encephalopathy, ?sinusitis: Has resolved.  -- Continue with 3 additional days of Augmentin per [facility] hospitalist for coverage of sinuses.  9. HTN: Monitor blood pressures TID--continue to hold losartan, HCTZ,  and metoprolol.               -- Monitor BP with increase in activity.  Resume home meds as indicated. 10.  T2DM: Monitor BS ac/hs. CM diet.  Continue to hold metformin and Ozempic.             --Was started on metformin a few weeks PTA which was causing diarrha--question cause of lactic acidosis.  11.  Morbid obesity: BMI 41.  Educated on importance of weight loss as well as activity to help promote overall health and mobility. 12.  CAD: On ASA and Crestor.  Continue to hold Cozaar and metoprolol for now.             -- Monitor for any symptoms with increase in activity. 13. GERD: Resume PPI. Was on Zegerid PTA.        Jacquelynn Cree, PA-C 08/31/2022  I have personally performed a face to face diagnostic evaluation of this patient and formulated the key components of the plan.  Additionally, I have personally reviewed laboratory data, imaging studies, as well as relevant notes and concur with the physician assistant's documentation above.  The patient's status has not changed from the original H&P.  Any changes in documentation from the acute care chart have been noted above.  Ranelle Oyster, MD, [place]

## 2022-08-31 NOTE — Progress Notes (Signed)
Report called to [address] west for patient transferring to room 26. Husband at bedside.

## 2022-08-31 NOTE — Progress Notes (Signed)
Patient arrived to unit in bed, accompanied by husband.  Assessment and introduction to rehab complete

## 2022-08-31 NOTE — Progress Notes (Signed)
Physical Therapy Treatment Patient Details Name: Anna Gamble MRN: [id number] DOB: 27-Aug-1935 Today's Date: 08/31/2022   History of Present Illness Pt is 87 y.o. female admitted on 08/27/22 for progressive weakness, confusion, and fall.  Pt dx with sepsis d/t acute bacterial sinusitis, and acute metabolic encephalopathy.  Pt with significant PMH of CAD, HTN, morbid obesity, severe aortic stenosis s/p TAVR, R foot surgery, and R TKA.    PT Comments  Pt continues to progress towards goals. Currently pt is below PLOF; pt was previously Mod I ambulating up and down 25 foot rows in her garden with upright RW. Currently pt requires Min A for functional mobility. Pt is at a high risk for falls due to impaired strength/balance with functional mobility. Pt was able to increase gait distance this session for 4x 15 ft with seated rest breaks between. Due to pt PLOF, home set up, available assistance at home and current level of function recommend skilled physical therapy services at a higher level of care and frequency (5-6x/weekly) in order to address strength, balance, functional mobility to decrease risk for falls, injury, decrease level of assist required from spouse, decrease risk for immobility and re-hospitalization. Pt tolerated treatment session well.      If plan is discharge home, recommend the following: A little help with walking and/or transfers;Assist for transportation   Can travel by private vehicle     Yes  Equipment Recommendations  None recommended by PT    Recommendations for Other Services       Precautions / Restrictions Precautions Precautions: Fall Restrictions Weight Bearing Restrictions: No     Mobility  Bed Mobility Overal bed mobility: Needs Assistance Bed Mobility: Supine to Sit, Sit to Supine     Supine to sit: Min assist Sit to supine: Min assist   General bed mobility comments: Min A at trunk and extra time for scooting with supine to sitting and Min A  for bil LE with sitting to supine. Patient Response: Cooperative  Transfers Overall transfer level: Needs assistance Equipment used: Rolling walker (2 wheels) Transfers: Sit to/from Stand, Bed to chair/wheelchair/BSC Sit to Stand: Min assist   Step pivot transfers: Min assist       General transfer comment: min A from low surface with occasional verbal cues for hand placement 4 for transfers and then 3x in a row with focus on safety and hand placement. Progressed to CGA    Ambulation/Gait Ambulation/Gait assistance: Min assist Gait Distance (Feet): 15 Feet (4x) Assistive device: Rolling walker (2 wheels) Gait Pattern/deviations: Step-through pattern, Decreased step length - right, Decreased step length - left, Antalgic Gait velocity: decreased cadence Gait velocity interpretation: <1.31 ft/sec, indicative of household ambulator   General Gait Details: improved step length, partial step through, improve AD management this session     Tilt Bed Tilt Bed Patient Response: Cooperative       Balance Overall balance assessment: Needs assistance Sitting-balance support: Feet supported, No upper extremity supported Sitting balance-Leahy Scale: Good     Standing balance support: Reliant on assistive device for balance, Bilateral upper extremity supported Standing balance-Leahy Scale: Fair Standing balance comment: needs min assist in standing with heavy reliance on AD for support.     Cognition Arousal: Alert Behavior During Therapy: WFL for tasks assessed/performed Overall Cognitive Status: Within Functional Limits for tasks assessed Area of Impairment: Safety/judgement       Safety/Judgement: Decreased awareness of safety  General Comments General comments (skin integrity, edema, etc.): Spouse present throughout session. Very supportive.      Pertinent Vitals/Pain Pain Assessment Pain Assessment: No/denies pain Pain Intervention(s):  Monitored during session     PT Goals (current goals can now be found in the care plan section) Acute Rehab PT Goals Patient Stated Goal: to get back to her normal self, and go back home as soon as she can PT Goal Formulation: With patient/family Time For Goal Achievement: 09/12/22 Potential to Achieve Goals: Good Progress towards PT goals: Progressing toward goals    Frequency    Min 1X/week      PT Plan  Continue with current POC       AM-PAC PT "6 Clicks" Mobility   Outcome Measure  Help needed turning from your back to your side while in a flat bed without using bedrails?: A Little Help needed moving from lying on your back to sitting on the side of a flat bed without using bedrails?: A Little Help needed moving to and from a bed to a chair (including a wheelchair)?: A Little Help needed standing up from a chair using your arms (e.g., wheelchair or bedside chair)?: A Little Help needed to walk in hospital room?: A Little Help needed climbing 3-5 steps with a railing? : A Lot 6 Click Score: 17    End of Session Equipment Utilized During Treatment: Gait belt Activity Tolerance: Patient tolerated treatment well Patient left: in bed;with call bell/phone within reach;with family/visitor present Nurse Communication: Mobility status PT Visit Diagnosis: Muscle weakness (generalized) (M62.81);Difficulty in walking, not elsewhere classified (R26.2);Pain     Time: 7425-9563 PT Time Calculation (min) (ACUTE ONLY): 26 min  Charges:    $Gait Training: 8-22 mins $Therapeutic Activity: 8-22 mins PT General Charges $$ ACUTE PT VISIT: 1 Visit                     Anna Gamble, DPT, CLT  Acute Rehabilitation Services Office: [phone] (Secure chat preferred)    Anna Gamble 08/31/2022, 11:23 AM

## 2022-08-31 NOTE — H&P (Signed)
Physical Medicine and Rehabilitation Admission H&P    Chief Complaint  Patient presents with   Functional deficits due to debility     HPI:  Anna Gamble is an 87 year old female with history of HTN, morbid obesity- BMI 41, aortic stenosis s/p TVAR, T2DM, severe OA bilateral knees who was admitted on 08/27/22 with reports of progressive weakness, confusion with fall, lethargy and reports of chills. She was found to have SIRS with encephalopathy, low grade fever with WBC-12.0  and lactic acidosis.  COVID-19 test negative.  CT head negative. She was started on IV antibiotics and IVF. UA negative. CXR  negative. BC X 2 pending but no growth X 4 days.  Metformin held with insulin sliding scale for blood sugar management.  Blood pressures noted to be soft therefore BP meds held.  Lactic acidosis and leukocytosis has resolved.   Maxillofacial CT done due to reports of facial pain with headaches as well as nasal drainage and was negative for sinusitis.  Lethargy has resolved and PT OT has been working with patient who is noted to be deconditioned and required min assist overall. Prior to admission patient was modified independent and was gardening with use of upright rolling walker.  Used wheelchair in home to do housework/get around (too slow). CIR recommended due to functional decline.   Review of Systems  Eyes:  Positive for photophobia (right eye).  Respiratory:  Positive for cough.   Cardiovascular:  Negative for chest pain and palpitations.  Gastrointestinal:  Positive for abdominal pain (stomach is real bad/can't eat anything/bloating.) and heartburn.  Musculoskeletal:  Positive for myalgias.  Neurological:  Positive for sensory change (RLE numbness since TKR/gives away) and headaches.  Psychiatric/Behavioral:  The patient is nervous/anxious.     Past Medical History:  Diagnosis Date   CAD (coronary artery disease) 03/19/2014   Colon polyp    Coronary artery disease    GERD  (gastroesophageal reflux disease)    Hx of colonoscopy with polypectomy 07/14/2012   medoff    Hyperlipidemia    Hypertension    Impaired fasting glucose    Morbid obesity (HCC)    S/P TAVR (transcatheter aortic valve replacement)    Severe aortic stenosis     Past Surgical History:  Procedure Laterality Date   APPENDECTOMY     CHOLECYSTECTOMY     FOOT SURGERY     rt   KNEE SURGERY     rt   LEFT AND RIGHT HEART CATHETERIZATION WITH CORONARY ANGIOGRAM N/A 10/10/2013   Procedure: LEFT AND RIGHT HEART CATHETERIZATION WITH CORONARY ANGIOGRAM;  Surgeon: Kathleene Hazel, MD;  Location: [facility] CATH LAB;  Service: Cardiovascular;  Laterality: N/A;   RIGHT/LEFT HEART CATH AND CORONARY ANGIOGRAPHY N/A 04/06/2018   Procedure: RIGHT/LEFT HEART CATH AND CORONARY ANGIOGRAPHY;  Surgeon: Kathleene Hazel, MD;  Location: [facility] INVASIVE CV LAB;  Service: Cardiovascular;  Laterality: N/A;   TEE WITHOUT CARDIOVERSION N/A 06/06/2018   Procedure: TRANSESOPHAGEAL ECHOCARDIOGRAM (TEE);  Surgeon: Kathleene Hazel, MD;  Location: [facility] OR;  Service: Open Heart Surgery;  Laterality: N/A;   TONSILLECTOMY     TOTAL KNEE ARTHROPLASTY  02/01/2011   Procedure: TOTAL KNEE ARTHROPLASTY;  Surgeon: Nestor Lewandowsky;  Location: [facility] OR;  Service: Orthopedics;  Laterality: Right;  DEPUY/SIGMA   TRANSCATHETER AORTIC VALVE REPLACEMENT, TRANSFEMORAL N/A 06/06/2018   Procedure: TRANSCATHETER AORTIC VALVE REPLACEMENT, TRANSFEMORAL;  Surgeon: Kathleene Hazel, MD;  Location: [facility] OR;  Service: Open Heart Surgery;  Laterality: N/A;  Family History  Problem Relation Age of Onset   Coronary artery disease Other        female- 1st degree relative   Coronary artery disease Other        female- 1st degree relative   Hypertension Other    Hyperlipidemia Other    Heart attack Mother        age 48's   Heart attack Father        age 2's   Stroke Brother        died    Hearing loss Son    Sleep apnea Son    Aortic  stenosis Sister    Stroke Brother    Heart attack Brother    Rheum arthritis Sister    Osteoarthritis Sister    Arthritis Sister    Heart Problems Brother     Social History:  Married. 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 She has never used smokeless tobacco. She reports that she does not drink alcohol and does not use drugs.   Allergies  Allergen Reactions   Lisinopril Cough   Statins Other (See Comments)    muscle aches with some statins, tolerates low doses of crestor   Semaglutide Other (See Comments)    Took rybelsus tab. Cause her to have abdominal pain.     Medications Prior to Admission  Medication Sig Dispense Refill   amoxicillin (AMOXIL) 500 MG capsule Take 2,000 mg by mouth See admin instructions. 2000 mg prior to dentist appointment     aspirin 81 MG tablet Take 81 mg by mouth daily.     hydrochlorothiazide (MICROZIDE) 12.5 MG capsule TAKE 1 CAPSULE BY MOUTH DAILY 90 capsule 3   losartan (COZAAR) 25 MG tablet TAKE 2 TABLETS BY MOUTH DAILY (Patient taking differently: Take 50 mg by mouth daily.) 180 tablet 3   metFORMIN (GLUCOPHAGE) 500 MG tablet TAKE 1 TABLET BY MOUTH DAILY (Patient taking differently: Take 500 mg by mouth in the morning and at bedtime.) 90 tablet 3   metoprolol succinate (TOPROL-XL) 50 MG 24 hr tablet TAKE 1 TABLET BY MOUTH DAILY 30 tablet 0   Misc Natural Products (TART CHERRY ADVANCED PO) Take 1 capsule by mouth daily.      Multiple Vitamins-Minerals (MULTIVITAMIN WITH MINERALS) tablet Take 1 tablet by mouth daily.     Multiple Vitamins-Minerals (PRESERVISION AREDS 2+MULTI VIT PO) Take 1 tablet by mouth daily.     Polyethyl Glycol-Propyl Glycol (SYSTANE OP) Place 2 drops into both eyes in the morning, at noon, and at bedtime.     Lancets (ACCU-CHEK SOFT TOUCH) lancets Use as instructed 100 each 12   rosuvastatin (CRESTOR) 5 MG tablet Take 1 tablet (5 mg total) by mouth daily. (Patient not  taking: Reported on 08/27/2022) 90 tablet 1   Semaglutide (OZEMPIC, 0.25 OR 0.5 MG/DOSE, [place]) Inject 0.25 mg into the skin once a week. (Patient not taking: Reported on 08/03/2022)      Home: Home Living Family/patient expects to be discharged to:: Private residence Living Arrangements: Spouse/significant other Available Help at Discharge: Family, Available PRN/intermittently Type of Home: House Home Access: Ramped entrance Home Layout: One level Bathroom Shower/Tub: Health visitor: Handicapped height Bathroom Accessibility: Yes Home Equipment: Information systems manager - built in, [place] - single point, Rollator (4 wheels), Wheelchair - manual, Other (comment) (adjustable bed (hob and foot)) Additional Comments: pt's son lives with her and husband, pt is a paraplegic does  not require assistance   Functional History: Prior Function Prior Level of Function : Independent/Modified Independent Mobility Comments: modified independent at w/c level. Pt reports stand pivot transfers to w/c ADLs Comments: independent with ADL, independent with cooking and medication, husband does the driving,  Functional Status:  Mobility: Bed Mobility Overal bed mobility: Needs Assistance Bed Mobility: Supine to Sit, Sit to Supine Supine to sit: Min assist Sit to supine: Min assist General bed mobility comments: Min A at trunk and extra time for scooting with supine to sitting and Min A for bil LE with sitting to supine. Transfers Overall transfer level: Needs assistance Equipment used: Rolling walker (2 wheels) Transfers: Sit to/from Stand, Bed to chair/wheelchair/BSC Sit to Stand: Min assist Bed to/from chair/wheelchair/BSC transfer type:: Step pivot Step pivot transfers: Min assist General transfer comment: min A from low surface with occasional verbal cues for hand placement 4 for transfers and then 3x in a row with focus on safety and hand placement. Progressed to CGA Ambulation/Gait Ambulation/Gait  assistance: Min assist Gait Distance (Feet): 15 Feet (4x) Assistive device: Rolling walker (2 wheels) Gait Pattern/deviations: Step-through pattern, Decreased step length - right, Decreased step length - left, Antalgic General Gait Details: improved step length, partial step through, improve AD management this session Gait velocity: decreased cadence Gait velocity interpretation: <1.31 ft/sec, indicative of household Actuary mobility: Yes Wheelchair propulsion: Both lower extermities, Both upper extremities Wheelchair parts: Needs assistance Distance: 50 ft with Min A for navigation and to get movement. Prior pt was Mod I for W/C mobility  ADL: ADL Overall ADL's : Needs assistance/impaired Eating/Feeding: Independent Grooming: Set up, Sitting Upper Body Bathing: Set up, Sitting Lower Body Bathing: Moderate assistance, Sit to/from stand Upper Body Dressing : Set up, Sitting Lower Body Dressing: Moderate assistance, Sit to/from stand Lower Body Dressing Details (indicate cue type and reason): assist to don briefs Toilet Transfer: Minimal assistance, Ambulation, Rolling walker (2 wheels) Toilet Transfer Details (indicate cue type and reason): continues to be limited to pivotal stepping only Toileting- Clothing Manipulation and Hygiene: Moderate assistance, Sit to/from stand Functional mobility during ADLs: Minimal assistance, Cueing for safety, Rolling walker (2 wheels) General ADL Comments: cues for safety, limitd by activity tolerance and fatigue from prior PT session  Cognition: Cognition Overall Cognitive Status: Within Functional Limits for tasks assessed Orientation Level: Oriented X4 Cognition Arousal: Alert Behavior During Therapy: WFL for tasks assessed/performed Overall Cognitive Status: Within Functional Limits for tasks assessed Area of Impairment: Safety/judgement Memory: Decreased short-term memory Safety/Judgement: Decreased  awareness of safety General Comments: OVerall WFL for simple tasks this date, would benefit from higer level assessment   Blood pressure (!) 129/55, pulse 86, temperature 98.2 F (36.8 C), temperature source Oral, resp. rate 18, height 5\' 1"  (1.549 m), weight 101.8 kg, SpO2 96%. Physical Exam Vitals and nursing note reviewed.  Constitutional:      Appearance: She is obese.     Comments: Obese female in NAD. Tends to keep right eye closed. (Dry eyes/light sensitivity)  HENT:     Head: Normocephalic.     Right Ear: External ear normal.     Left Ear: External ear normal.     Nose: Nose normal.     Mouth/Throat:     Mouth: Mucous membranes are moist.  Eyes:     Extraocular Movements: Extraocular movements intact.     Pupils: Pupils are equal, round, and reactive to light.  Cardiovascular:     Rate and Rhythm: Normal rate and  regular rhythm.     Heart sounds: No murmur heard.    No gallop.  Pulmonary:     Effort: Pulmonary effort is normal. No respiratory distress.     Breath sounds: No wheezing.  Abdominal:     General: Bowel sounds are normal. There is no distension.     Tenderness: There is no abdominal tenderness.  Musculoskeletal:     Cervical back: Normal range of motion.     Comments: Right shoulder with limited IR/ER d/t pain. Right knee with TKA scar and limitations in flexion.   Skin:    General: Skin is warm and dry.  Neurological:     Mental Status: She is alert.     Comments: Alert and oriented x 3. Normal insight and awareness. Intact Memory. Normal language and speech. Cranial nerve exam unremarkable. MMT: RUE limited by shoulder pain but otherwise 4/5. LUE grossly 5/5. RLE 4/5 prox to distal. LLE 4+/5. No focal sensory abnl. No abnl tone.    Psychiatric:        Mood and Affect: Mood normal.        Behavior: Behavior normal.     Results for orders placed or performed during the hospital encounter of 08/27/22 (from the past 48 hour(s))  Glucose, capillary      Status: Abnormal   Collection Time: 08/29/22 11:56 AM  Result Value Ref Range   Glucose-Capillary 163 (H) 70 - 99 mg/dL    Comment: Glucose reference range applies only to samples taken after fasting for at least 8 hours.  Glucose, capillary     Status: Abnormal   Collection Time: 08/29/22  4:16 PM  Result Value Ref Range   Glucose-Capillary 124 (H) 70 - 99 mg/dL    Comment: Glucose reference range applies only to samples taken after fasting for at least 8 hours.  Glucose, capillary     Status: Abnormal   Collection Time: 08/29/22  9:38 PM  Result Value Ref Range   Glucose-Capillary 145 (H) 70 - 99 mg/dL    Comment: Glucose reference range applies only to samples taken after fasting for at least 8 hours.  CBC     Status: Abnormal   Collection Time: 08/30/22  2:55 AM  Result Value Ref Range   WBC 6.7 4.0 - 10.5 K/uL   RBC 3.48 (L) 3.87 - 5.11 MIL/uL   Hemoglobin 10.8 (L) 12.0 - 15.0 g/dL   HCT 52.8 (L) 41.3 - 24.4 %   MCV 93.1 80.0 - 100.0 fL   MCH 31.0 26.0 - 34.0 pg   MCHC 33.3 30.0 - 36.0 g/dL   RDW 01.0 27.2 - 53.6 %   Platelets 174 150 - 400 K/uL   nRBC 0.0 0.0 - 0.2 %    Comment: Performed at [facility], [address], [place], [place] [address]  Basic metabolic panel     Status: Abnormal   Collection Time: 08/30/22  2:55 AM  Result Value Ref Range   Sodium 136 135 - 145 mmol/L   Potassium 3.9 3.5 - 5.1 mmol/L   Chloride 102 98 - 111 mmol/L   CO2 24 22 - 32 mmol/L   Glucose, Bld 135 (H) 70 - 99 mg/dL    Comment: Glucose reference range applies only to samples taken after fasting for at least 8 hours.   BUN 10 8 - 23 mg/dL   Creatinine, Ser 4.74 0.44 - 1.00 mg/dL   Calcium 8.2 (L) 8.9 - 10.3 mg/dL   GFR,  Estimated >60 >60 mL/min    Comment: (NOTE) Calculated using the CKD-EPI Creatinine Equation (2021)    Anion gap 10 5 - 15    Comment: Performed at [facility], [address] [place], [place] [address]  Glucose, capillary     Status: Abnormal    Collection Time: 08/30/22  7:49 AM  Result Value Ref Range   Glucose-Capillary 128 (H) 70 - 99 mg/dL    Comment: Glucose reference range applies only to samples taken after fasting for at least 8 hours.  Glucose, capillary     Status: Abnormal   Collection Time: 08/30/22 12:02 PM  Result Value Ref Range   Glucose-Capillary 126 (H) 70 - 99 mg/dL    Comment: Glucose reference range applies only to samples taken after fasting for at least 8 hours.  Glucose, capillary     Status: Abnormal   Collection Time: 08/30/22  4:44 PM  Result Value Ref Range   Glucose-Capillary 116 (H) 70 - 99 mg/dL    Comment: Glucose reference range applies only to samples taken after fasting for at least 8 hours.  Glucose, capillary     Status: Abnormal   Collection Time: 08/30/22  9:36 PM  Result Value Ref Range   Glucose-Capillary 125 (H) 70 - 99 mg/dL    Comment: Glucose reference range applies only to samples taken after fasting for at least 8 hours.  CBC     Status: Abnormal   Collection Time: 08/31/22  6:30 AM  Result Value Ref Range   WBC 8.1 4.0 - 10.5 K/uL   RBC 3.62 (L) 3.87 - 5.11 MIL/uL   Hemoglobin 11.3 (L) 12.0 - 15.0 g/dL   HCT 21.3 (L) 08.6 - 57.8 %   MCV 93.4 80.0 - 100.0 fL   MCH 31.2 26.0 - 34.0 pg   MCHC 33.4 30.0 - 36.0 g/dL   RDW 46.9 62.9 - 52.8 %   Platelets 215 150 - 400 K/uL   nRBC 0.0 0.0 - 0.2 %    Comment: Performed at [facility], [address] [place], [place] [address]  Basic metabolic panel     Status: Abnormal   Collection Time: 08/31/22  6:30 AM  Result Value Ref Range   Sodium 138 135 - 145 mmol/L   Potassium 3.6 3.5 - 5.1 mmol/L   Chloride 103 98 - 111 mmol/L   CO2 26 22 - 32 mmol/L   Glucose, Bld 134 (H) 70 - 99 mg/dL    Comment: Glucose reference range applies only to samples taken after fasting for at least 8 hours.   BUN 7 (L) 8 - 23 mg/dL   Creatinine, Ser 4.01 0.44 - 1.00 mg/dL   Calcium 8.2 (L) 8.9 - 10.3 mg/dL   GFR, Estimated >02 >72 mL/min     Comment: (NOTE) Calculated using the CKD-EPI Creatinine Equation (2021)    Anion gap 9 5 - 15    Comment: Performed at [facility], [address] [place], [place] [address]  Glucose, capillary     Status: Abnormal   Collection Time: 08/31/22  8:12 AM  Result Value Ref Range   Glucose-Capillary 191 (H) 70 - 99 mg/dL    Comment: Glucose reference range applies only to samples taken after fasting for at least 8 hours.   CT Maxillofacial LTD WO CM  Result Date: 08/31/2022 CLINICAL DATA:  Initial evaluation for sinusitis. EXAM: CT PARANASAL SINUS LIMITED WITHOUT CONTRAST TECHNIQUE: Multidetector CT images of  the paranasal sinuses were obtained using the standard protocol without intravenous contrast. RADIATION DOSE REDUCTION: This exam was performed according to the departmental dose-optimization program which includes automated exposure control, adjustment of the mA and/or kV according to patient size and/or use of iterative reconstruction technique. COMPARISON:  None Available. FINDINGS: Paranasal sinuses: Frontal: Normally aerated. Patent frontal sinus drainage pathways. Ethmoid: Mild mucoperiosteal thickening present about the ethmoidal air cells bilaterally. No significant sinus opacification. Maxillary: Maxillary sinuses are clear. Osseous thickening and sclerosis at the floor of the right maxillary sinus likely related to underlying chronic dental disease. Sphenoid: Normally aerated. Patent sphenoethmoidal recesses. Right ostiomeatal unit: Patent. Left ostiomeatal unit: Patent. Nasal passages: Patent. Sigmoid nasal septal deviation. No concha bullosa. Anatomy: No pneumatization superior to anterior ethmoid notches. Symmetric and intact olfactory grooves and fovea ethmoidalis, Keros II (4-26mm). Sellar sphenoid pneumatization pattern. No dehiscence of carotid or optic canals. No onodi cell. Other: Visualized intracranial contents within normal limits. Mastoid air cells and middle ear cavities  are well pneumatized and free of fluid. Globes and orbital soft tissues within normal limits. Sequelae of prior bilateral ocular lens replacement. Remainder of the visualized facial soft tissues demonstrate no significant finding. IMPRESSION: Clear sinuses with patent drainage pathways. No CT evidence for acute sinusitis at this time. Electronically Signed   By: Rise Mu M.D.   On: 08/31/2022 04:45      Blood pressure (!) 129/55, pulse 86, temperature 98.2 F (36.8 C), temperature source Oral, resp. rate 18, height 5\' 1"  (1.549 m), weight 101.8 kg, SpO2 96%.  Medical Problem List and Plan: 1. Functional deficits secondary to debility and associated encephalopathy possibly related to bacterial sinusitis/sepsis  -patient may shower  -ELOS/Goals: 7-10 days, supervision goals with PT and OT 2.  Antithrombotics: -DVT/anticoagulation:  Pharmaceutical: Lovenox  -antiplatelet therapy: ASA 3.  OA bilateral knees,(hx of right TKA), pain Management: Tylenol as needed.  For therapy as needed. 4. Mood/Behavior/Sleep: LCSW to call for evaluation and support.  -- Melatonin as needed for insomnia  -antipsychotic agents:  N/A 5. Neuropsych/cognition: This patient is capable of making decisions on her own behalf. 6. Skin/Wound Care: Routine pressure-relief measures. 7. Fluids/Electrolytes/Nutrition: Monitor I's/O.  Check c-Met. 8. SIRS w/ encephalopathy, ?sinusitis: Has resolved.  -- Continue with 3 additional days of Augmentin per [facility] hospitalist.  9. HTN: Monitor blood pressures TID--continue to hold losartan, HCTZ, and metoprolol.    -- Monitor BP with increase in activity.  Resume home meds as indicated. 10.  T2DM: Monitor BS ac/hs. CM diet.  Continue to hold metformin and Ozempic.  --Was started on metformin a few weeks PTA which was causing diarrha--question cause of lactic acidosis.  11.  Morbid obesity: BMI 41.  Educated on importance of weight loss as well as activity to help promote  overall health and mobility. 12.  CAD: On ASA and Crestor.  Continue to hold Cozaar and metoprolol for now.  -- Monitor for any symptoms with increase in activity. 13. GERD: Resume PPI. Was on Zegerid PTA.        Jacquelynn Cree, PA-C 08/31/2022

## 2022-08-31 NOTE — Plan of Care (Signed)
  Problem: Coping: Goal: Ability to adjust to condition or change in health will improve Outcome: Progressing   Problem: Skin Integrity: Goal: Risk for impaired skin integrity will decrease Outcome: Progressing   Problem: Clinical Measurements: Goal: Ability to maintain clinical measurements within normal limits will improve Outcome: Progressing   Problem: Safety: Goal: Ability to remain free from injury will improve Outcome: Progressing   Problem: Coping: Goal: Ability to adjust to condition or change in health will improve Outcome: Progressing   Problem: Skin Integrity: Goal: Risk for impaired skin integrity will decrease Outcome: Progressing

## 2022-08-31 NOTE — Progress Notes (Signed)
Ranelle Oyster, MD Physician Physical Medicine and Rehabilitation   PMR Pre-admission    Signed   Date of Service: 08/31/2022 12:01 PM  Related encounter: ED to Hosp-Admission (Discharged) from 08/27/2022 in [facility]   Signed     Expand All Collapse All  Show:Clear all [x] Written[x] Templated[] Copied  Added by: [x] Areonna Bran, T[place], RN[x] Ranelle Oyster, MD  [] Hover for details PMR Admission Coordinator Pre-Admission Assessment   Patient: Anna Gamble is an 87 y.o., female MRN: [id number] DOB: 1935/04/04 Height: 5\' 1"  (154.9 cm) Weight: 101.8 kg   Insurance Information HMO:     PPO:      PCP:      IPA:      80/20:      OTHER:  PRIMARY: Medicare a and b      Policy#: [id number]      Subscriber: pt Benefits:  Phone #: passport one source online     Name: 8/13 Eff. Date: 08/19/2002     Deduct: $1632      Out of Pocket Max: none      Life Max: none CIR: 100%      SNF: 20 full days Outpatient: 80%     Co-Pay: 20% Home Health: 100%      Co-Pay: none DME: 80%     Co-Pay: 20% Providers: pt choice  SECONDARY: AARP supplement      Policy#: [id number]   Financial Counselor:       Phone#:    The "Data Collection Information Summary" for patients in Inpatient Rehabilitation Facilities with attached "Privacy Act Statement-Health Care Records" was provided and verbally reviewed with: Patient and Family   Emergency Contact Information Contact Information       Name Relation Home Work Mobile    [place] [phone]   [phone]         Other Contacts   None on File      Current Medical History  Patient Admitting Diagnosis: Debility due to sepsis   History of Present Illness: 87 year old female with history of CAD, HTN, HLD, severe aortic stenosis s/p TAVR, type 2 DM, severe arthritis of Bilateral knees, who presented on 08/27/22 due to progressive weakness  and a fall. Mod I with RW in garden but typically uses her  wheelchair in the home and the community due to severe knee arthritis.    Presented with altered mental status, low grade fever and lactic acidosis and leukocytosis. Sepsis due to acute bacterial sinusitis. Treated with broad spectrum antibiotics, Vancomycin, cefepime and Flagyl. Blood cultures negative. Reported congestion, purulent drainage from nose and headache. CT head with no acute findings. Negative meningeal signs. Mentation now back to baseline. Holding metformin and on SSI. BP soft so held losartan, HCTZ and metoprolol. Lovenox for DVT prophylaxis.    Patient's medical [facility] from [facility] has been reviewed by the rehabilitation admission coordinator and physician.   Past Medical History      Past Medical History:  Diagnosis Date   CAD (coronary artery disease) 03/19/2014   Colon polyp     Coronary artery disease     GERD (gastroesophageal reflux disease)     Hx of colonoscopy with polypectomy 07/14/2012    medoff    Hyperlipidemia     Hypertension     Impaired fasting glucose     Morbid obesity (HCC)     S/P TAVR (transcatheter aortic valve replacement)     Severe aortic stenosis  Has the patient had major surgery during 100 days prior to admission? No   Family History   family history includes Aortic stenosis in her sister; Arthritis in her sister; Coronary artery disease in some other family members; Hearing loss in her son; Heart Problems in her brother; Heart attack in her brother, father, and mother; Hyperlipidemia in an other family member; Hypertension in an other family member; Osteoarthritis in her sister; Rheum arthritis in her sister; Sleep apnea in her son; Stroke in her brother and brother.   Current Medications  Current Medications    Current Facility-Administered Medications:    acetaminophen (TYLENOL) tablet 650 mg, 650 mg, Oral, Q6H PRN, 650 mg at 08/30/22 1649 **OR** acetaminophen (TYLENOL) suppository 650 mg, 650 mg, Rectal, Q6H PRN,  Elgergawy, Dawood S, MD   albuterol (PROVENTIL) (2.5 MG/3ML) 0.083% nebulizer solution 2.5 mg, 2.5 mg, Nebulization, Q2H PRN, Elgergawy, Leana Roe, MD   Ampicillin-Sulbactam (UNASYN) 3 g in sodium chloride 0.9 % 100 mL IVPB, 3 g, Intravenous, Q8H, Elgergawy, Dawood S, MD, Last Rate: 200 mL/hr at 08/31/22 0435, 3 g at 08/31/22 0435   aspirin EC tablet 81 mg, 81 mg, Oral, Daily, Elgergawy, Leana Roe, MD, 81 mg at 08/31/22 0941   enoxaparin (LOVENOX) injection 50 mg, 50 mg, Subcutaneous, Q24H, Elgergawy, Leana Roe, MD, 50 mg at 08/31/22 0942   hydrALAZINE (APRESOLINE) injection 5 mg, 5 mg, Intravenous, Q4H PRN, Elgergawy, Leana Roe, MD   insulin aspart (novoLOG) injection 0-15 Units, 0-15 Units, Subcutaneous, TID WC, Elgergawy, Leana Roe, MD, 3 Units at 08/31/22 0942   insulin aspart (novoLOG) injection 0-5 Units, 0-5 Units, Subcutaneous, QHS, Elgergawy, Leana Roe, MD     Patients Current Diet:  Diet Order                  Diet heart healthy/carb modified Room service appropriate? Yes; Fluid consistency: Thin  Diet effective now                       Precautions / Restrictions Precautions Precautions: Fall Restrictions Weight Bearing Restrictions: No    Has the patient had 2 or more falls or a fall with injury in the past year? Yes   Prior Activity Level Limited Community (1-2x/wk): Mod I with RW; uses wheelchair in home due to efficiency; uses RW in garden   Prior Functional Level Self Care: Did the patient need help bathing, dressing, using the toilet or eating? Independent   Indoor Mobility: Did the patient need assistance with walking from room to room (with or without device)? Independent   Stairs: Did the patient need assistance with internal or external stairs (with or without device)? Independent   Functional Cognition: Did the patient need help planning regular tasks such as shopping or remembering to take medications? Needed some help   Patient Information Are you of  Hispanic, Latino/a,or Spanish origin?: A. No, not of Hispanic, Latino/a, or Spanish origin What is your race?: A. White Do you need or want an interpreter to communicate with a doctor or health care staff?: 0. No   Patient's Response To:  Health Literacy and Transportation Is the patient able to respond to health literacy and transportation needs?: Yes Health Literacy - How often do you need to have someone help you when you read instructions, pamphlets, or other written material from your doctor or pharmacy?: Never In the past 12 months, has lack of transportation kept you from medical appointments or from getting medications?: No In  the past 12 months, has lack of transportation kept you from meetings, work, or from getting things needed for daily living?: No   Journalist, newspaper / Corporate investment banker Devices/Equipment: Wheelchair Home Equipment: Shower seat - built in, [place] - single point, Rollator (4 wheels), Wheelchair - manual, Other (comment) (adjustable bed (hob and foot))   Prior Device Use: Indicate devices/aids used by the patient prior to current illness, exacerbation or injury? Manual wheelchair and Walker   Current Functional Level Cognition   Overall Cognitive Status: Within Functional Limits for tasks assessed Orientation Level: Oriented X4 Safety/Judgement: Decreased awareness of safety General Comments: OVerall WFL for simple tasks this date, would benefit from higer level assessment    Extremity Assessment (includes Sensation/Coordination)   Upper Extremity Assessment: Generalized weakness  Lower Extremity Assessment: Defer to PT evaluation RLE Deficits / Details: noted increased pain in R knee impacting MMT will continue to assess. no knee buckling noted with standing, but unable to do more than 1-2 LAQs with PT in sitting.     ADLs   Overall ADL's : Needs assistance/impaired Eating/Feeding: Independent Grooming: Set up, Sitting Upper Body Bathing: Set  up, Sitting Lower Body Bathing: Moderate assistance, Sit to/from stand Upper Body Dressing : Set up, Sitting Lower Body Dressing: Moderate assistance, Sit to/from stand Lower Body Dressing Details (indicate cue type and reason): assist to don briefs Toilet Transfer: Minimal assistance, Ambulation, Rolling walker (2 wheels) Toilet Transfer Details (indicate cue type and reason): continues to be limited to pivotal stepping only Toileting- Clothing Manipulation and Hygiene: Moderate assistance, Sit to/from stand Functional mobility during ADLs: Minimal assistance, Cueing for safety, Rolling walker (2 wheels) General ADL Comments: cues for safety, limitd by activity tolerance and fatigue from prior PT session     Mobility   Overal bed mobility: Needs Assistance Bed Mobility: Supine to Sit, Sit to Supine Supine to sit: Min assist Sit to supine: Min assist General bed mobility comments: Min A at trunk and extra time for scooting with supine to sitting and Min A for bil LE with sitting to supine.     Transfers   Overall transfer level: Needs assistance Equipment used: Rolling walker (2 wheels) Transfers: Sit to/from Stand, Bed to chair/wheelchair/BSC Sit to Stand: Min assist Bed to/from chair/wheelchair/BSC transfer type:: Step pivot Step pivot transfers: Min assist General transfer comment: min A from low surface with occasional verbal cues for hand placement 4 for transfers and then 3x in a row with focus on safety and hand placement. Progressed to CGA     Ambulation / Gait / Stairs / Psychologist, prison and probation services   Ambulation/Gait Ambulation/Gait assistance: Editor, commissioning (Feet): 15 Feet (4x) Assistive device: Rolling walker (2 wheels) Gait Pattern/deviations: Step-through pattern, Decreased step length - right, Decreased step length - left, Antalgic General Gait Details: improved step length, partial step through, improve AD management this session Gait velocity: decreased  cadence Gait velocity interpretation: <1.31 ft/sec, indicative of household Actuary mobility: Yes Wheelchair propulsion: Both lower extermities, Both upper extremities Wheelchair parts: Needs assistance Distance: 50 ft with Min A for navigation and to get movement. Prior pt was Mod I for W/C mobility     Posture / Balance Balance Overall balance assessment: Needs assistance Sitting-balance support: Feet supported, No upper extremity supported Sitting balance-Leahy Scale: Good Standing balance support: Reliant on assistive device for balance, Bilateral upper extremity supported Standing balance-Leahy Scale: Fair Standing balance comment: needs min assist in standing with heavy reliance on AD  for support.     Special needs/care consideration Fall precautions    Previous Home Environment  Living Arrangements: Spouse/significant other Available Help at Discharge: Family, Available PRN/intermittently Type of Home: House Home Layout: One level Home Access: Ramped entrance Bathroom Shower/Tub: Health visitor: Handicapped height Bathroom Accessibility: Yes How Accessible: Accessible via wheelchair Home Care Services: No Additional Comments: pt's son lives with her and husband, pt is a paraplegic does not require assistance   Discharge Living Setting Plans for Discharge Living Setting: Patient's home, Lives with (comment) (spouse and son) Type of Home at Discharge: House Discharge Home Layout: One level Discharge Home Access: Ramped entrance Discharge Bathroom Shower/Tub: Walk-in shower Discharge Bathroom Toilet: Handicapped height Discharge Bathroom Accessibility: Yes How Accessible: Accessible via wheelchair, Accessible via walker Does the patient have any problems obtaining your medications?: No   Social/Family/Support Systems Patient Roles: Spouse, Parent Contact Information: spouse, Therapist, nutritional Anticipated Caregiver:  spouse Anticipated Industrial/product designer Information: see contacts Ability/Limitations of Caregiver: none Caregiver Availability: 24/7 Discharge Plan Discussed with Primary Caregiver: Yes Is Caregiver In Agreement with Plan?: Yes Does Caregiver/Family have Issues with Lodging/Transportation while Pt is in Rehab?: No   Goals Patient/Family Goal for Rehab: supervision with PT and OT Expected length of stay: ELOS 7 to 10 days Additional Information: Home is totally handicapped accessible for son who is a paraplegic Pt/Family Agrees to Admission and willing to participate: Yes Program Orientation Provided & Reviewed with Pt/Caregiver Including Roles  & Responsibilities: Yes Additional Information Needs: Patient wants to d/c as soon as possible   Decrease burden of Care through IP rehab admission: n/a   Possible need for SNF placement upon discharge: not anticipated; p[patient refuses   Patient Condition: I have reviewed medical records from [facility], spoken with SW, and patient and spouse. I met with patient at the bedside for inpatient rehabilitation assessment.  Patient will benefit from ongoing PT and OT, can actively participate in 3 hours of therapy a day 5 days of the week, and can make measurable gains during the admission.  Patient will also benefit from the coordinated team approach during an Inpatient Acute Rehabilitation admission.  The patient will receive intensive therapy as well as Rehabilitation physician, nursing, social worker, and care management interventions.  Due to bladder management, bowel management, safety, skin/wound care, disease management, medication administration, pain management, and patient education the patient requires 24 hour a day rehabilitation nursing.  The patient is currently min assist overall with mobility and basic ADLs.  Discharge setting and therapy post discharge at home with home health is anticipated.  Patient has agreed to participate in the  Acute Inpatient Rehabilitation Program and will admit today.   Preadmission Screen Completed By:  Clois Dupes, RN MSN 08/31/2022 12:01 PM ______________________________________________________________________   Discussed status with Dr. Riley Kill on 08/31/22 at 1210 and received approval for admission today.   Admission Coordinator:  Clois Dupes, RN MSN time 1210 Date 08/31/22    Assessment/Plan: Diagnosis: Debility related sepsis and multiple medical issues Does the need for close, 24 hr/day Medical supervision in concert with the patient's rehab needs make it unreasonable for this patient to be served in a less intensive setting? Yes Co-Morbidities requiring supervision/potential complications: CAD, HTN, severe AS, DM, severe OA of both knees Due to bladder management, bowel management, safety, skin/wound care, disease management, medication administration, pain management, and patient education, does the patient require 24 hr/day rehab nursing? Yes Does the patient require coordinated care of a physician,  rehab nurse, PT, OT, and SLP to address physical and functional deficits in the context of the above medical diagnosis(es)? Yes Addressing deficits in the following areas: balance, endurance, locomotion, strength, transferring, bowel/bladder control, bathing, dressing, feeding, grooming, toileting, and psychosocial support Can the patient actively participate in an intensive therapy program of at least 3 hrs of therapy 5 days a week? Yes The potential for patient to make measurable gains while on inpatient rehab is excellent Anticipated functional outcomes upon discharge from inpatient rehab: supervision PT, supervision OT, n/a SLP Estimated rehab length of stay to reach the above functional goals is: 7-10 days Anticipated discharge destination: Home 10. Overall Rehab/Functional Prognosis: excellent     MD Signature: Ranelle Oyster, MD, [facility] Physical  Medicine & Rehabilitation Medical Director Rehabilitation Services 08/31/2022           Revision History

## 2022-08-31 NOTE — Plan of Care (Signed)
  Problem: Consults Goal: RH GENERAL PATIENT EDUCATION Description: See Patient Education module for education specifics. Outcome: Progressing   Problem: RH BOWEL ELIMINATION Goal: RH STG MANAGE BOWEL WITH ASSISTANCE Description: STG Manage Bowel with mod I Assistance. Outcome: Progressing Goal: RH STG MANAGE BOWEL W/MEDICATION W/ASSISTANCE Description: STG Manage Bowel with Medication with mod I Assistance. Outcome: Progressing   Problem: RH BLADDER ELIMINATION Goal: RH STG MANAGE BLADDER WITH ASSISTANCE Description: STG Manage Bladder With toileting Assistance; wears briefs; MOD I assist Outcome: Progressing   Problem: RH SAFETY Goal: RH STG ADHERE TO SAFETY PRECAUTIONS W/ASSISTANCE/DEVICE Description: STG Adhere to Safety Precautions With cues Assistance/Device. Outcome: Progressing   Problem: RH KNOWLEDGE DEFICIT GENERAL Goal: RH STG INCREASE KNOWLEDGE OF SELF CARE AFTER HOSPITALIZATION Description: Patient and spouse will be able to manage care at discharge using educational resources independently Outcome: Progressing

## 2022-08-31 NOTE — Progress Notes (Signed)
  Inpatient Rehabilitation Admissions Coordinator   Met with patient and spouse at bedside for rehab assessment. We discussed goals and expectations of a possible CIR admit. They prefer CIR for rehab. Family can provide expected caregiver support that is recommended of supervision level. Case reviewed with Dr Riley Kill . I have CIR bed available to admit her today. Acute team and TOC made aware. I will make the arrangements. Please call me with any questions.   Ottie Glazier, RN, MSN Rehab Admissions Coordinator [phone]

## 2022-08-31 NOTE — Progress Notes (Signed)
Patient transferred to room 26 on [address]. Spouse with patient.

## 2022-08-31 NOTE — Discharge Summary (Signed)
Physician Discharge Summary  Tigerlily Lumia QIH:[id number] DOB: 1935-06-01 DOA: 08/27/2022  PCP: [name], MD  Admit date: 08/27/2022 Discharge date: 08/31/2022  Admitted From: (Home) Disposition:  ( CIR )  Recommendations for Outpatient Follow-up:  Adjust antihypertensive medications as needed, losartan and hydrochlorothiazide has been discontinued at time of discharge due to acceptable blood pressure during hospital stay off any medications Patient will discharge on insulin sliding scale to CIR, metformin and Ozempic can be resumed upon discharge from CIR.   Discharge Condition: (Stable) CODE STATUS: (FULL) Diet recommendation: Heart Healthy / Carb Modified   Brief/Interim Summary:  Anna Gamble  is a 87 y.o. 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, ambulates with a walker and mainly wheelchair at home, she presents to ED secondary to progressive weakness, fall, she appears to be confused, unable to provide reliable history, her workup significant for SIRS, lactic acid elevated at 2.5, low-grade temperature, UA is negative, she denies any urinary symptoms, chest x-ray with no acute cardiopulmonary finding, CT head with no acute intracranial findings, x-rays of bilateral knees, and right hand significant for severe osteoarthritis, given her SIRS finding, and encephalopathic with admitted for further workup, clinically there was concern for acute sinusitis.      Sepsis due to possible acute bacterial sinusitis -Patient presents with altered mental status, low-grade temperature 99.3, lactic acidosis and leukocytosis -She was treated with broad-spectrum antibiotic, vancomycin, cefepime and Flagyl, blood cultures remain negative, MRSA PCR is negative,  Septic workup remains negative so far antibiotics has been narrowed to  Unasyn, as there was a concern for acute bacterial sinusitis, as she Reports congestion, purulent discharge from her nose, headache upon laying supine, she is having clinical endings of acute bacterial sinusitis, will discharge another 3 days of oral Augmentin as an outpatient. -COVID-19 test is negative   Acute metabolic encephalopathy -CT head with no acute findings -Most likely related to infection -She has negative meningeal signs -Mentation back to baseline   Diabetes mellitus Continue with insulin sliding scale at time of discharge, metformin and Ozempic can be resumed after DC from CIR   Hyperlipidemia -Her statin appears to be discontinued by her PCP,   Hypertension -Blood pressure soft, so we will hold losartan, hydrochlorothiazide, she will be resumed on metoprolol  History of aortic stenosis status post TAVR   Weakness/deconditioning - PT/OT, recommendation for CIR   Obesity class 3 Body mass index is 42.41 kg/m. Continue Ozempic once stable   GERD-continue with PPI   Hypokalemia -Repleted   Hyponatremia-improved with gentle hydration    Discharge Diagnoses:  Principal Problem:   SIRS (systemic inflammatory response syndrome) (HCC) Active Problems:   HLD (hyperlipidemia)   Essential hypertension   GERD (gastroesophageal reflux disease)   CAD (coronary artery disease)   Severe aortic valve stenosis   S/P TAVR (transcatheter aortic valve replacement)   Type 2 diabetes mellitus with hyperglycemia, without long-term current use of insulin [facility])    Discharge Instructions  Discharge Instructions     Diet - low sodium heart healthy   Complete by: As directed    Discharge instructions   Complete by: As directed    Management per CIR   Increase activity slowly   Complete by: As directed       Allergies as of 08/31/2022       Reactions   Lisinopril Cough   Statins Other (  See Comments)   muscle aches with some statins, tolerates low doses of crestor    Semaglutide Other (See Comments)   Took rybelsus tab. Cause her to have abdominal pain.         Medication List     STOP taking these medications    hydrochlorothiazide 12.5 MG capsule Commonly known as: MICROZIDE   losartan 25 MG tablet Commonly known as: COZAAR   metFORMIN 500 MG tablet Commonly known as: GLUCOPHAGE   OZEMPIC (0.25 OR 0.5 MG/DOSE) [place]       TAKE these medications    accu-chek soft touch lancets Use as instructed   amoxicillin 500 MG capsule Commonly known as: AMOXIL Take 2,000 mg by mouth See admin instructions. 2000 mg prior to dentist appointment   amoxicillin-clavulanate 875-125 MG tablet Commonly known as: AUGMENTIN Take 1 tablet by mouth every 12 (twelve) hours for 3 days.   aspirin 81 MG tablet Take 81 mg by mouth daily.   insulin aspart 100 UNIT/ML injection Commonly known as: novoLOG Inject 0-15 Units into the skin 3 (three) times daily with meals.   insulin aspart 100 UNIT/ML injection Commonly known as: novoLOG Inject 0-5 Units into the skin at bedtime.   metoprolol succinate 50 MG 24 hr tablet Commonly known as: TOPROL-XL TAKE 1 TABLET BY MOUTH DAILY   PRESERVISION AREDS 2+MULTI VIT PO Take 1 tablet by mouth daily.   multivitamin with minerals tablet Take 1 tablet by mouth daily.   rosuvastatin 5 MG tablet Commonly known as: Crestor Take 1 tablet (5 mg total) by mouth daily.   SYSTANE OP Place 2 drops into both eyes in the morning, at noon, and at bedtime.   TART CHERRY ADVANCED PO Take 1 capsule by mouth daily.        Allergies  Allergen Reactions   Lisinopril Cough   Statins Other (See Comments)    muscle aches with some statins, tolerates low doses of crestor   Semaglutide Other (See Comments)    Took rybelsus tab. Cause her to have abdominal pain.     Consultations: NONE   Procedures/Studies: CT Maxillofacial LTD WO CM  Result Date: 08/31/2022 CLINICAL DATA:  Initial evaluation for sinusitis.  EXAM: CT PARANASAL SINUS LIMITED WITHOUT CONTRAST TECHNIQUE: Multidetector CT images of the paranasal sinuses were obtained using the standard protocol without intravenous contrast. RADIATION DOSE REDUCTION: This exam was performed according to the departmental dose-optimization program which includes automated exposure control, adjustment of the mA and/or kV according to patient size and/or use of iterative reconstruction technique. COMPARISON:  None Available. FINDINGS: Paranasal sinuses: Frontal: Normally aerated. Patent frontal sinus drainage pathways. Ethmoid: Mild mucoperiosteal thickening present about the ethmoidal air cells bilaterally. No significant sinus opacification. Maxillary: Maxillary sinuses are clear. Osseous thickening and sclerosis at the floor of the right maxillary sinus likely related to underlying chronic dental disease. Sphenoid: Normally aerated. Patent sphenoethmoidal recesses. Right ostiomeatal unit: Patent. Left ostiomeatal unit: Patent. Nasal passages: Patent. Sigmoid nasal septal deviation. No concha bullosa. Anatomy: No pneumatization superior to anterior ethmoid notches. Symmetric and intact olfactory grooves and fovea ethmoidalis, Keros II (4-73mm). Sellar sphenoid pneumatization pattern. No dehiscence of carotid or optic canals. No onodi cell. Other: Visualized intracranial contents within normal limits. Mastoid air cells and middle ear cavities are well pneumatized and free of fluid. Globes and orbital soft tissues within normal limits. Sequelae of prior bilateral ocular lens replacement. Remainder of the visualized facial soft tissues demonstrate no significant finding. IMPRESSION: Clear sinuses with patent  drainage pathways. No CT evidence for acute sinusitis at this time. Electronically Signed   By: Rise Mu M.D.   On: 08/31/2022 04:45   CT Head Wo Contrast  Result Date: 08/27/2022 CLINICAL DATA:  Head trauma, minor, normal mental status (Age 28-64y) EXAM: CT  HEAD WITHOUT CONTRAST TECHNIQUE: Contiguous axial images were obtained from the base of the skull through the vertex without intravenous contrast. RADIATION DOSE REDUCTION: This exam was performed according to the departmental dose-optimization program which includes automated exposure control, adjustment of the mA and/or kV according to patient size and/or use of iterative reconstruction technique. COMPARISON:  02/16/2016 FINDINGS: Brain: No evidence of acute infarction, hemorrhage, hydrocephalus, extra-axial collection or mass lesion/mass effect. Incidental note of a small amount of subependymal gray matter heterotopia. Patchy low-density changes within the periventricular and subcortical white matter most compatible with chronic microvascular ischemic change. Mild diffuse cerebral volume loss. Vascular: No hyperdense vessel or unexpected calcification. Skull: Normal. Negative for fracture or focal lesion. Sinuses/Orbits: No acute finding. Other: None. IMPRESSION: 1. No acute intracranial findings. 2. Chronic microvascular ischemic change and cerebral volume loss. Electronically Signed   By: Duanne Guess D.O.   On: 08/27/2022 15:10   DG Hand 2 View Right  Result Date: 08/27/2022 CLINICAL DATA:  87 year old female with history of trauma from a fall. Right-sided hand pain. EXAM: RIGHT HAND - 2 VIEW COMPARISON:  Right hand radiograph 04/27/2017. FINDINGS: Two views of the right hand demonstrate no acute displaced fracture or dislocation. Multifocal joint space narrowing, subchondral sclerosis, subchondral cyst formation and osteophyte formation, most severe in the DIP and PIP joints, as well as the first [facility] joint, compatible with osteoarthritis. IMPRESSION: 1. No acute radiographic abnormality of the right hand. 2. Degenerative changes of osteoarthritis redemonstrated, as above. Electronically Signed   By: Trudie Reed M.D.   On: 08/27/2022 08:10   DG Knee 2 Views Left  Result Date: 08/27/2022 CLINICAL  DATA:  87 year old female with history of trauma from a fall complaining of left knee pain. EXAM: LEFT KNEE - 1-2 VIEW COMPARISON:  No priors. FINDINGS: Two views of the left knee demonstrate no acute displaced fracture or dislocation. There is joint space narrowing, subchondral sclerosis, subchondral cyst formation and osteophyte formation in a tricompartmental distribution, most severe in the medial and patellofemoral compartments, compatible with osteoarthritis. Soft tissues are grossly unremarkable. IMPRESSION: 1. No acute radiographic abnormality of the left knee. 2. Tricompartmental osteoarthritis, most severe in the medial and patellofemoral compartments. Electronically Signed   By: Trudie Reed M.D.   On: 08/27/2022 08:05   DG Knee Complete 4 Views Right  Result Date: 08/27/2022 CLINICAL DATA:  87 year old female with history of trauma from a fall. Right-sided knee pain. EXAM: RIGHT KNEE - COMPLETE 4+ VIEW COMPARISON:  None Available. FINDINGS: Four views of the right knee demonstrate postoperative changes of total knee arthroplasty. The femoral and tibial component of the prosthesis appear well seated without periprosthetic fracture or other acute abnormalities. Soft tissues are unremarkable in appearance. IMPRESSION: 1. No acute radiographic abnormality of the right knee. 2. Status post total knee arthroplasty. Electronically Signed   By: Trudie Reed M.D.   On: 08/27/2022 08:04   DG Chest 2 View  Result Date: 08/27/2022 CLINICAL DATA:  87 year old female status post fall. EXAM: CHEST - 2 VIEW COMPARISON:  Portable chest 06/06/2018 and earlier. FINDINGS: Semi upright AP and lateral views of the chest at 0722 hours. Sequelae of TAVR. Lung volumes and mediastinal contours are stable. Visualized  tracheal air column is within normal limits. No pneumothorax, pulmonary edema, pleural effusion or confluent lung opacity. Osteopenia. No acute osseous abnormality identified. Paucity of bowel gas.  IMPRESSION: No acute cardiopulmonary abnormality. Electronically Signed   By: Odessa Fleming M.D.   On: 08/27/2022 08:00      Subjective:  She denies any complaints today, no significant events overnight as discussed with staff Discharge Exam: Vitals:   08/31/22 0436 08/31/22 0800  BP: 121/65 (!) 129/55  Pulse: 88 86  Resp: 20 18  Temp: 98.9 F (37.2 C) 98.2 F (36.8 C)  SpO2: 95% 96%   Vitals:   08/30/22 2348 08/31/22 0400 08/31/22 0436 08/31/22 0800  BP: (!) 142/57  121/65 (!) 129/55  Pulse: 81  88 86  Resp: 20  20 18   Temp: 97.9 F (36.6 C)  98.9 F (37.2 C) 98.2 F (36.8 C)  TempSrc: Oral Axillary Oral Oral  SpO2: 95%  95% 96%  Weight:      Height:        General: Pt is alert, awake, not in acute distress Cardiovascular: RRR, S1/S2 +, no rubs, no gallops Respiratory: CTA bilaterally, no wheezing, no rhonchi Abdominal: Soft, NT, ND, bowel sounds + Extremities: no edema, no cyanosis    The results of significant diagnostics from this hospitalization (including imaging, microbiology, ancillary and laboratory) are listed below for reference.     Microbiology: Recent Results (from the past 240 hour(s))  Resp panel by RT-PCR (RSV, Flu A&B, Covid) Anterior Nasal Swab     Status: None   Collection Time: 08/27/22  7:55 AM   Specimen: Anterior Nasal Swab  Result Value Ref Range Status   SARS Coronavirus 2 by RT PCR NEGATIVE NEGATIVE Final   Influenza A by PCR NEGATIVE NEGATIVE Final   Influenza B by PCR NEGATIVE NEGATIVE Final    Comment: (NOTE) The Xpert Xpress SARS-CoV-2/FLU/RSV plus assay is intended as an aid in the diagnosis of influenza from Nasopharyngeal swab specimens and should not be used as a sole basis for treatment. Nasal washings and aspirates are unacceptable for Xpert Xpress SARS-CoV-2/FLU/RSV testing.  Fact Sheet for Patients: BloggerCourse.com  Fact Sheet for Healthcare  Providers: SeriousBroker.it  This test is not yet approved or cleared by the [place] FDA and has been authorized for detection and/or diagnosis of SARS-CoV-2 by FDA under an Emergency Use Authorization (EUA). This EUA will remain in effect (meaning this test can be used) for the duration of the COVID-19 declaration under Section 564(b)(1) of the Act, 21 U.S.C. section 360bbb-3(b)(1), unless the authorization is terminated or revoked.     Resp Syncytial Virus by PCR NEGATIVE NEGATIVE Final    Comment: (NOTE) Fact Sheet for Patients: BloggerCourse.com  Fact Sheet for Healthcare Providers: SeriousBroker.it  This test is not yet approved or cleared by the [place] FDA and has been authorized for detection and/or diagnosis of SARS-CoV-2 by FDA under an Emergency Use Authorization (EUA). This EUA will remain in effect (meaning this test can be used) for the duration of the COVID-19 declaration under Section 564(b)(1) of the Act, 21 U.S.C. section 360bbb-3(b)(1), unless the authorization is terminated or revoked.  Performed at [facility], [address], [place], [place] [address]   Culture, blood (Routine X 2) w Reflex to ID Panel     Status: None (Preliminary result)   Collection Time: 08/27/22  4:25 PM   Specimen: BLOOD RIGHT HAND  Result Value Ref Range Status   Specimen Description BLOOD  RIGHT HAND  Final   Special Requests   Final    BOTTLES DRAWN AEROBIC AND ANAEROBIC Blood Culture results may not be optimal due to an inadequate volume of blood received in culture bottles   Culture   Final    NO GROWTH 4 DAYS Performed at [facility], [address], [place], [place] [address]    Report Status PENDING  Incomplete  Culture, blood (Routine X 2) w Reflex to ID Panel     Status: None (Preliminary result)   Collection Time: 08/27/22  4:35 PM   Specimen: BLOOD  Result Value Ref  Range Status   Specimen Description BLOOD LEFT ANTECUBITAL  Final   Special Requests   Final    BOTTLES DRAWN AEROBIC AND ANAEROBIC Blood Culture results may not be optimal due to an inadequate volume of blood received in culture bottles   Culture   Final    NO GROWTH 4 DAYS Performed at [facility], [address], [place], [place] [address]    Report Status PENDING  Incomplete  MRSA Next Gen by PCR, Nasal     Status: None   Collection Time: 08/28/22  2:13 PM   Specimen: Nasal Mucosa; Nasal Swab  Result Value Ref Range Status   MRSA by PCR Next Gen NOT DETECTED NOT DETECTED Final    Comment: (NOTE) The GeneXpert MRSA Assay (FDA approved for NASAL specimens only), is one component of a comprehensive MRSA colonization surveillance program. It is not intended to diagnose MRSA infection nor to guide or monitor treatment for MRSA infections. Test performance is not FDA approved in patients less than 44 years old. Performed at [facility], [address], [place], [place] [address]      Labs: BNP (last 3 results) No results for input(s): "BNP" in the last 8760 hours. Basic Metabolic Panel: Recent Labs  Lab 08/27/22 0651 08/28/22 0857 08/29/22 0206 08/30/22 0255 08/31/22 0630  NA 132* 136 132* 136 138  K 3.7 3.7 3.4* 3.9 3.6  CL 97* 102 100 102 103  CO2 24 23 22 24 26   GLUCOSE 210* 117* 109* 135* 134*  BUN 14 11 11 10  7*  CREATININE 0.94 0.83 0.85 0.77 0.60  CALCIUM 8.6* 8.4* 8.3* 8.2* 8.2*  MG 1.8  --   --   --   --    Liver Function Tests: Recent Labs  Lab 08/27/22 0651 08/28/22 0857  AST 25 30  ALT 27 28  ALKPHOS 59 60  BILITOT 1.7* 1.1  PROT 6.2* 6.0*  ALBUMIN 3.1* 2.8*   No results for input(s): "LIPASE", "AMYLASE" in the last 168 hours. No results for input(s): "AMMONIA" in the last 168 hours. CBC: Recent Labs  Lab 08/27/22 0651 08/28/22 0857 08/29/22 0206 08/30/22 0255 08/31/22 0630  WBC 12.0* 8.2 7.6 6.7 8.1  NEUTROABS 11.0*  --    --   --   --   HGB 12.5 12.8 11.3* 10.8* 11.3*  HCT 37.0 37.2 33.1* 32.4* 33.8*  MCV 93.0 94.9 94.8 93.1 93.4  PLT 171 164 155 174 215   Cardiac Enzymes: No results for input(s): "CKTOTAL", "CKMB", "CKMBINDEX", "TROPONINI" in the last 168 hours. BNP: Invalid input(s): "POCBNP" CBG: Recent Labs  Lab 08/30/22 0749 08/30/22 1202 08/30/22 1644 08/30/22 2136 08/31/22 0812  GLUCAP 128* 126* 116* 125* 191*   D-Dimer No results for input(s): "DDIMER" in the last 72 hours. Hgb A1c No results for input(s): "HGBA1C" in the last 72  hours. Lipid Profile No results for input(s): "CHOL", "HDL", "LDLCALC", "TRIG", "CHOLHDL", "LDLDIRECT" in the last 72 hours. Thyroid function studies No results for input(s): "TSH", "T4TOTAL", "T3FREE", "THYROIDAB" in the last 72 hours.  Invalid input(s): "FREET3" Anemia work up No results for input(s): "VITAMINB12", "FOLATE", "FERRITIN", "TIBC", "IRON", "RETICCTPCT" in the last 72 hours. Urinalysis    Component Value Date/Time   COLORURINE AMBER (A) 08/27/2022 0705   APPEARANCEUR CLEAR 08/27/2022 0705   LABSPEC 1.024 08/27/2022 0705   PHURINE 6.0 08/27/2022 0705   GLUCOSEU NEGATIVE 08/27/2022 0705   HGBUR NEGATIVE 08/27/2022 0705   HGBUR small 07/25/2007 1236   BILIRUBINUR NEGATIVE 08/27/2022 0705   BILIRUBINUR neg 09/10/2021 1317   KETONESUR 5 (A) 08/27/2022 0705   PROTEINUR 30 (A) 08/27/2022 0705   UROBILINOGEN negative (A) 09/10/2021 1317   UROBILINOGEN 1.0 01/28/2011 1318   NITRITE NEGATIVE 08/27/2022 0705   LEUKOCYTESUR NEGATIVE 08/27/2022 0705   Sepsis Labs Recent Labs  Lab 08/28/22 0857 08/29/22 0206 08/30/22 0255 08/31/22 0630  WBC 8.2 7.6 6.7 8.1   Microbiology Recent Results (from the past 240 hour(s))  Resp panel by RT-PCR (RSV, Flu A&B, Covid) Anterior Nasal Swab     Status: None   Collection Time: 08/27/22  7:55 AM   Specimen: Anterior Nasal Swab  Result Value Ref Range Status   SARS Coronavirus 2 by RT PCR NEGATIVE  NEGATIVE Final   Influenza A by PCR NEGATIVE NEGATIVE Final   Influenza B by PCR NEGATIVE NEGATIVE Final    Comment: (NOTE) The Xpert Xpress SARS-CoV-2/FLU/RSV plus assay is intended as an aid in the diagnosis of influenza from Nasopharyngeal swab specimens and should not be used as a sole basis for treatment. Nasal washings and aspirates are unacceptable for Xpert Xpress SARS-CoV-2/FLU/RSV testing.  Fact Sheet for Patients: BloggerCourse.com  Fact Sheet for Healthcare Providers: SeriousBroker.it  This test is not yet approved or cleared by the [place] FDA and has been authorized for detection and/or diagnosis of SARS-CoV-2 by FDA under an Emergency Use Authorization (EUA). This EUA will remain in effect (meaning this test can be used) for the duration of the COVID-19 declaration under Section 564(b)(1) of the Act, 21 U.S.C. section 360bbb-3(b)(1), unless the authorization is terminated or revoked.     Resp Syncytial Virus by PCR NEGATIVE NEGATIVE Final    Comment: (NOTE) Fact Sheet for Patients: BloggerCourse.com  Fact Sheet for Healthcare Providers: SeriousBroker.it  This test is not yet approved or cleared by the [place] FDA and has been authorized for detection and/or diagnosis of SARS-CoV-2 by FDA under an Emergency Use Authorization (EUA). This EUA will remain in effect (meaning this test can be used) for the duration of the COVID-19 declaration under Section 564(b)(1) of the Act, 21 U.S.C. section 360bbb-3(b)(1), unless the authorization is terminated or revoked.  Performed at [facility], [address], [place], [place] [address]   Culture, blood (Routine X 2) w Reflex to ID Panel     Status: None (Preliminary result)   Collection Time: 08/27/22  4:25 PM   Specimen: BLOOD RIGHT HAND  Result Value Ref Range Status   Specimen Description BLOOD  RIGHT HAND  Final   Special Requests   Final    BOTTLES DRAWN AEROBIC AND ANAEROBIC Blood Culture results may not be optimal due to an inadequate volume of blood received in culture bottles   Culture   Final    NO GROWTH 4 DAYS Performed at [facility], [address] [place], [place] [address]    Report Status PENDING  Incomplete  Culture, blood (Routine X 2) w Reflex to ID Panel     Status: None (Preliminary result)   Collection Time: 08/27/22  4:35 PM   Specimen: BLOOD  Result Value Ref Range Status   Specimen Description BLOOD LEFT ANTECUBITAL  Final   Special Requests   Final    BOTTLES DRAWN AEROBIC AND ANAEROBIC Blood Culture results may not be optimal due to an inadequate volume of blood received in culture bottles   Culture   Final    NO GROWTH 4 DAYS Performed at [facility], [address], [place], [place] [address]    Report Status PENDING  Incomplete  MRSA Next Gen by PCR, Nasal     Status: None   Collection Time: 08/28/22  2:13 PM   Specimen: Nasal Mucosa; Nasal Swab  Result Value Ref Range Status   MRSA by PCR Next Gen NOT DETECTED NOT DETECTED Final    Comment: (NOTE) The GeneXpert MRSA Assay (FDA approved for NASAL specimens only), is one component of a comprehensive MRSA colonization surveillance program. It is not intended to diagnose MRSA infection nor to guide or monitor treatment for MRSA infections. Test performance is not FDA approved in patients less than 79 years old. Performed at [facility], [address], [place], [place] [address]      Time coordinating discharge: Over 30 minutes  SIGNED:   Huey Bienenstock, MD  [facility] 08/31/2022, 12:03 PM Pager   If 7PM-7AM, please contact night-coverage [email or web address] Password [email or web address]

## 2022-09-01 ENCOUNTER — Other Ambulatory Visit: Payer: Self-pay | Admitting: Physical Medicine and Rehabilitation

## 2022-09-01 DIAGNOSIS — R5381 Other malaise: Secondary | ICD-10-CM | POA: Diagnosis not present

## 2022-09-01 LAB — GLUCOSE, CAPILLARY
Glucose-Capillary: 120 mg/dL — ABNORMAL HIGH (ref 70–99)
Glucose-Capillary: 113 mg/dL — ABNORMAL HIGH (ref 70–99)
Glucose-Capillary: 119 mg/dL — ABNORMAL HIGH (ref 70–99)

## 2022-09-01 MED ORDER — FREESTYLE LIBRE 3 SENSOR MISC: 3 refills | Status: DC

## 2022-09-01 MED ORDER — POTASSIUM CHLORIDE 20 MEQ PO PACK
40.0000 meq | PACK | Freq: Once | ORAL | Status: AC
  Administered 2022-09-01: 40 meq via ORAL
  Filled 2022-09-01: qty 2

## 2022-09-01 MED ORDER — LORATADINE 10 MG PO TABS
10.0000 mg | ORAL_TABLET | Freq: Every day | ORAL | Status: DC
  Administered 2022-09-01 – 2022-09-07 (×7): 10 mg via ORAL
  Filled 2022-09-01 (×7): qty 1

## 2022-09-01 MED ORDER — FREESTYLE LIBRE 3 READER DEVI: 0 refills | Status: DC

## 2022-09-01 MED ORDER — VITAMIN D (ERGOCALCIFEROL) 1.25 MG (50000 UNIT) PO CAPS
50000.0000 [IU] | ORAL_CAPSULE | ORAL | Status: DC
  Administered 2022-09-01: 50000 [IU] via ORAL
  Filled 2022-09-01: qty 1

## 2022-09-01 NOTE — Patient Care Conference (Signed)
Inpatient RehabilitationTeam Conference and Plan of Care Update Date: 09/01/2022   Time: 11:42 AM    Patient Name: Anna Gamble      Medical Record Number: [id number]  Date of Birth: 09-18-1935 Sex: Female         Room/Bed: 4W26C/4W26C-01 Payor Info: Payor: MEDICARE / Plan: MEDICARE PART A AND B / Product Type: *No Product type* /    Admit Date/Time:  08/31/2022  3:02 PM  Primary Diagnosis:  Debility  Hospital Problems: Principal Problem:   Debility    Expected Discharge Date: Expected Discharge Date: 09/11/22  Team Members Present: Physician leading conference: Dr. Sula Soda Social Worker Present: Lavera Guise, BSW Nurse Present: Chana Bode, RN PT Present: Raechel Chute, PT OT Present: Candee Furbish, OT SLP Present: Jeannie Done, SLP PPS Coordinator present : Fae Pippin, SLP     Current Status/Progress Goal Weekly Team Focus  Bowel/Bladder   Pt is incontinent of bowel/bladder   Pt will become continent of bowel/bladder   Will assess qshift and PRN    Swallow/Nutrition/ Hydration               ADL's   Supervision UB, mod A LB, min A toileting   Mod I, some supervision   Barriers- endurance, dynamic balance, problem solving. Plan- ambulatory endurance, bathing at shower level    Mobility   transfers with RW min A, gait 73ft with RW min A, WC mobility 165ft supervision   Mod I  barriers: global strengthening and endurance, dynamic standing balance/coordination, family education    Communication                Safety/Cognition/ Behavioral Observations               Pain   Pt denies pain   Pt will continue to deny pain   Will assess qshift and PRN    Skin   Pt's skin is intact   Pt's skin will remain intact  Will assess qshift and PRN      Discharge Planning:  new admission, assesment pending.   Team Discussion: Patient admitted with debility; concerned about medication regimen and side effects leading to  deconditioning with co-morbidities.  DM managed with diet modification due to GI distress with medications. Incontinence PTA; wears briefs and noted nasal congestion. Working on TEFL teacher and endurance.  Patient on target to meet rehab goals: yes, currently needs supervision for upper body and mod assist for lower body care. Needs min assist for toileting, min assist for transfers, able to ambulate up to 25' using a RW with min assist.  Goals for discharge set for mod I overall.  *See Care Plan and progress notes for long and short-term goals.   Revisions to Treatment Plan:  Josephine Igo' system order placed per MD   Teaching Needs: Safety, medications,  dietary modification, transfers and toileting, etc.   Current Barriers to Discharge: Decreased caregiver support  Possible Resolutions to Barriers: Family education  Ozempic can be resumed upon [facility]     Medical Summary Current Status: urinary incontinence, systolc hypertension, diastolic hypotension, suboptimal potassium morbid obesity, type 2 diabetes, nasal congestion  Barriers to Discharge: Medical stability;Neurogenic Bowel & Bladder  Barriers to Discharge Comments: urinary incontinence, systolc hypertension, diastolic hypotension, suboptimal potassium morbid obesity, type 2 diabetes, nasal congestion Possible Resolutions to [employer] Focus: timed voiding, continue to monitor BP TID, optimize potassium to goal of 4, provided dietary education, freestyle libre ordered, claritin started HS   Continued  Need for Acute Rehabilitation Level of Care: The patient requires daily medical management by a physician with specialized training in physical medicine and rehabilitation for the following reasons: Direction of a multidisciplinary physical rehabilitation program to maximize functional independence : Yes Medical management of patient stability for increased activity during participation in an intensive  rehabilitation regime.: Yes Analysis of laboratory values and/or radiology reports with any subsequent need for medication adjustment and/or medical intervention. : Yes   I attest that I was present, lead the team conference, and concur with the assessment and plan of the team.   Chana Bode B 09/01/2022, 1:48 PM

## 2022-09-01 NOTE — Progress Notes (Signed)
Physical Therapy Session Note  Patient Details  Name: [name] MRN: [id number] Date of Birth: 12/29/1935  Today's Date: 09/01/2022 PT Individual Time: 2130-8657 PT Individual Time Calculation (min): 55 min   Short Term Goals: Week 1:  PT Short Term Goal 1 (Week 1): STG=LTG due to LOS  Skilled Therapeutic Interventions/Progress Updates:   Received pt sitting in WC, pt's phone ringing and therapist brought phone to pt to answer. Pt agreeable to PT treatment and reported mild headache but declined any pain medication. Pt hyperverbal throughout session but easily redirected. Session with emphasis on functional mobility/transfers, generalized strengthening and endurance, dynamic standing balance/coordination, toileting, and gait training. Pt transported to/from room in [facility] dependently for time management purposes.   Pt performed all transfers with RW and min A throughout session. Pt ambulated 48ft with RW and min A. Took seated rest break, then ambulated additional 169ft with RW and min A - cues to keep RW within BOS, for upright posture/gaze, and R knee extension. Pt performed alternating toe taps to 6in step with BUE support on RW 2x10 with CGA for balance. Transitioned to sit<>stands without RW and CGA 2x8 with emphasis on quad strength. Pt reported soiling brief - transferred mat<>WC stand<>pivot without AD and min A (cues for turning technique) and returned to room. Pt ambulated in/out of bathroom with RW and min A and able to manage clothing standing with CGA. Pt's brief soiled and pt continued to void on toilet. Performed hygiene management without assist, then doffed pants and soiled brief with supervision and donned clean brief, pants, and shoes with max A for time management purposes. Pt sat in WC at sink and washed hands with set up assist. Concluded session with pt sitting in WC, needs within reach, and chair pad alarm on.   Therapy Documentation Precautions:  Restrictions Weight  Bearing Restrictions: No  Therapy/Group: Individual Therapy [name] [name] PT, DPT 09/01/2022, 7:10 AM

## 2022-09-01 NOTE — Evaluation (Signed)
Occupational Therapy Assessment and Plan  Patient Details  Name: Anna Gamble MRN: [id number] Date of Birth: 20-Apr-1935  OT Diagnosis: abnormal posture, acute pain, cognitive deficits, muscle weakness (generalized), and swelling of limb Rehab Potential: Rehab Potential (ACUTE ONLY): Good ELOS: 7-10 days   Today's Date: 09/01/2022 OT Individual Time: 0803-0900 OT Individual Time Calculation (min): 57 min     Hospital Problem: Principal Problem:   Debility   Past Medical History:  Past Medical History:  Diagnosis Date   CAD (coronary artery disease) 03/19/2014   Colon polyp    Coronary artery disease    GERD (gastroesophageal reflux disease)    Hx of colonoscopy with polypectomy 07/14/2012   medoff    Hyperlipidemia    Hypertension    Impaired fasting glucose    Morbid obesity (HCC)    S/P TAVR (transcatheter aortic valve replacement)    Severe aortic stenosis    Past Surgical History:  Past Surgical History:  Procedure Laterality Date   APPENDECTOMY     CHOLECYSTECTOMY     FOOT SURGERY     rt   KNEE SURGERY     rt   LEFT AND RIGHT HEART CATHETERIZATION WITH CORONARY ANGIOGRAM N/A 10/10/2013   Procedure: LEFT AND RIGHT HEART CATHETERIZATION WITH CORONARY ANGIOGRAM;  Surgeon: Kathleene Hazel, MD;  Location: [facility] CATH LAB;  Service: Cardiovascular;  Laterality: N/A;   RIGHT/LEFT HEART CATH AND CORONARY ANGIOGRAPHY N/A 04/06/2018   Procedure: RIGHT/LEFT HEART CATH AND CORONARY ANGIOGRAPHY;  Surgeon: Kathleene Hazel, MD;  Location: [facility] INVASIVE CV LAB;  Service: Cardiovascular;  Laterality: N/A;   TEE WITHOUT CARDIOVERSION N/A 06/06/2018   Procedure: TRANSESOPHAGEAL ECHOCARDIOGRAM (TEE);  Surgeon: Kathleene Hazel, MD;  Location: [facility] OR;  Service: Open Heart Surgery;  Laterality: N/A;   TONSILLECTOMY     TOTAL KNEE ARTHROPLASTY  02/01/2011   Procedure: TOTAL KNEE ARTHROPLASTY;  Surgeon: Nestor Lewandowsky;  Location: [facility] OR;  Service: Orthopedics;  Laterality:  Right;  DEPUY/SIGMA   TRANSCATHETER AORTIC VALVE REPLACEMENT, TRANSFEMORAL N/A 06/06/2018   Procedure: TRANSCATHETER AORTIC VALVE REPLACEMENT, TRANSFEMORAL;  Surgeon: Kathleene Hazel, MD;  Location: [facility] OR;  Service: Open Heart Surgery;  Laterality: N/A;    Assessment & Plan Clinical Impression:  Anna Gamble is an 87 year old female with history of HTN, morbid obesity- BMI 41, aortic stenosis s/p TVAR, T2DM, severe OA bilateral knees who was admitted on 08/27/22 with reports of progressive weakness, confusion with fall, lethargy and reports of chills. She was found to have SIRS with encephalopathy, low grade fever with WBC-12.0  and lactic acidosis.  COVID-19 test negative.  CT head negative. She was started on IV antibiotics and IVF. UA negative. CXR  negative. BC X 2 pending but no growth X 4 days.  Metformin held with insulin sliding scale for blood sugar management.  Blood pressures noted to be soft therefore BP meds held.  Lactic acidosis and leukocytosis has resolved.    Maxillofacial CT done due to reports of facial pain with headaches as well as nasal drainage and was negative for sinusitis.  Lethargy has resolved and PT OT has been working with patient who is noted to be deconditioned and required min assist overall. Prior to admission patient was modified independent and was gardening with use of upright rolling walker.  Used wheelchair in home to do housework/get around (too slow). CIR recommended due to functional decline. Patient transferred to CIR on 08/31/2022 .    Patient currently requires mod with  basic self-care skills secondary to muscle weakness, decreased cardiorespiratoy endurance, decreased coordination, decreased problem solving, and decreased sitting balance and decreased standing balance.  Prior to hospitalization, patient could complete ADLs independently.  Patient will benefit from skilled intervention to increase independence with basic self-care skills and increase  level of independence with iADL prior to discharge home with care partner.  Anticipate patient will require intermittent supervision and follow up home health.  OT - End of Session Activity Tolerance: Tolerates < 10 min activity, no significant change in vital signs Endurance Deficit: Yes Endurance Deficit Description: required rest breaks throughout and SOB noted OT Assessment Rehab Potential (ACUTE ONLY): Good OT Barriers to Discharge: Incontinence;Decreased caregiver support OT Patient demonstrates impairments in the following area(s): Balance;Cognition;Pain;Edema;Endurance;Motor;Skin Integrity OT Basic ADL's Functional Problem(s): Bathing;Dressing;Toileting OT Advanced ADL's Functional Problem(s): Simple Meal Preparation OT Transfers Functional Problem(s): Toilet;Tub/Shower OT Additional Impairment(s): None OT Plan OT Intensity: Minimum of 1-2 x/day, 45 to 90 minutes OT Frequency: 5 out of 7 days OT Duration/Estimated Length of Stay: 7-10 days OT Treatment/Interventions: Balance/vestibular training;Discharge planning;Pain management;Self Care/advanced ADL retraining;Therapeutic Activities;UE/LE Coordination activities;Cognitive remediation/compensation;Disease mangement/prevention;Functional mobility training;Patient/family education;Skin care/wound managment;Therapeutic Exercise;Community reintegration;DME/adaptive equipment instruction;Neuromuscular re-education;Psychosocial support;UE/LE Strength taining/ROM;Wheelchair propulsion/positioning OT Self Feeding Anticipated Outcome(s): Mod I OT Basic Self-Care Anticipated Outcome(s): Mod I OT Toileting Anticipated Outcome(s): Mod I OT Bathroom Transfers Anticipated Outcome(s): Mod I OT Recommendation Recommendations for Other Services: Therapeutic Recreation consult Therapeutic Recreation Interventions: Pet therapy Patient destination: Home Follow Up Recommendations: Home health OT Equipment Recommended: To be determined   OT  Evaluation Precautions/Restrictions  Precautions Precautions: Fall Restrictions Weight Bearing Restrictions: No Home Living/Prior Functioning Home Living Family/patient expects to be discharged to:: Private residence Living Arrangements: Spouse/significant other Available Help at Discharge: Family, Available PRN/intermittently (husband can only provide supervision but has grandson PRN) Type of Home: House Home Access: Ramped entrance Home Layout: One level Bathroom Shower/Tub: Walk-in shower, Other (comment) (also step in tub with door with roll in shower also available that their son uses) Firefighter: Handicapped height Bathroom Accessibility: Yes Additional Comments: Pt and husband report house is handicap accessible. 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, insurance claims also Leisure and Hobbies: gardening Prior Function Level of Independence: Requires assistive device for independence  Able to Take Stairs?: No Driving: No Vision Baseline Vision/History: 1 Wears glasses Ability to See in Adequate Light: 1 Impaired Patient Visual Report: Eye fatigue/eye pain/headache;Other (comment) (light sensitivity but is baseline) Vision Assessment?: No apparent visual deficits Perception  Perception: Within Functional Limits Praxis Praxis: WFL Cognition Cognition Overall Cognitive Status: Within Functional Limits for tasks assessed Arousal/Alertness: Awake/alert Orientation Level: Person;Place;Situation Person: Oriented Place: Oriented Situation: Oriented Memory: Appears intact Awareness: Impaired Problem Solving: Impaired Safety/Judgment: Appears intact Brief Interview for Mental Status (BIMS) Repetition of Three Words (First Attempt): 3 Temporal Orientation:  Year: Correct Temporal Orientation: Month: Accurate within 5 days Temporal Orientation: Day: Correct Recall: "Sock": Yes, no cue required Recall: "Blue": Yes, no cue required Recall: "Bed": Yes, no cue required BIMS Summary Score: 15 Sensation Sensation Light Touch: Impaired Detail Light Touch Impaired Details: Impaired RLE Hot/Cold: Not tested Proprioception: Appears Intact Stereognosis: Not tested Additional Comments: numbness along entire RLE Coordination Gross Motor Movements are Fluid and Coordinated: Yes Fine Motor Movements are Fluid and Coordinated: No Coordination and Movement Description: global weakness/deconditioning Finger Nose Finger Test:  WFL bilaterally but slow Heel Shin Test: slower on RLE Motor  Motor Motor: Within Functional Limits (global weakness/deconditioning)  Trunk/Postural Assessment  Cervical Assessment Cervical Assessment: Within Functional Limits Thoracic Assessment Thoracic Assessment: Exceptions to [facility] (thoracic rounding) Lumbar Assessment Lumbar Assessment: Exceptions to [facility] (posterior pelvic tilt) Postural Control Postural Control: Within Functional Limits  Balance Balance Balance Assessed: Yes Static Sitting Balance Static Sitting - Balance Support: Feet supported;No upper extremity supported Static Sitting - Level of Assistance: 5: Stand by assistance (supervision) Dynamic Sitting Balance Dynamic Sitting - Balance Support: Feet supported;No upper extremity supported Dynamic Sitting - Level of Assistance: 5: Stand by assistance (supervision) Static Standing Balance Static Standing - Balance Support: Bilateral upper extremity supported;During functional activity (RW) Static Standing - Level of Assistance: 5: Stand by assistance (CGA) Dynamic Standing Balance Dynamic Standing - Balance Support: Bilateral upper extremity supported;During functional activity (RW) Dynamic Standing - Level of Assistance: 4: Min assist Dynamic Standing -  Comments: wiht transfers and gait Extremity/Trunk Assessment RUE Assessment RUE Assessment: Exceptions to [facility] General Strength Comments: 4-/5 grossly LUE Assessment LUE Assessment: Exceptions to [facility] General Strength Comments: 4-/5 grossly  Care Tool Care Tool Self Care Eating   Eating Assist Level: Set up assist    Oral Care    Oral Care Assist Level: Supervision/Verbal cueing    Bathing   Body parts bathed by patient: Right arm;Left arm;Chest;Abdomen;Front perineal area;Buttocks;Right upper leg;Left upper leg;Face Body parts bathed by helper: Right lower leg;Left lower leg   Assist Level: Minimal Assistance - Patient > 75%    Upper Body Dressing(including orthotics)   What is the patient wearing?: Pull over shirt   Assist Level: Supervision/Verbal cueing    Lower Body Dressing (excluding footwear)   What is the patient wearing?: Incontinence brief;Pants Assist for lower body dressing: Moderate Assistance - Patient 50 - 74%    Putting on/Taking off footwear   What is the patient wearing?: Non-skid slipper socks Assist for footwear: Dependent - Patient 0%       Care Tool Toileting Toileting activity   Assist for toileting: Minimal Assistance - Patient > 75%     Care Tool Bed Mobility Roll left and right activity   Roll left and right assist level: Supervision/Verbal cueing    Sit to lying activity   Sit to lying assist level: Supervision/Verbal cueing    Lying to sitting on side of bed activity   Lying to sitting on side of bed assist level: the ability to move from lying on the back to sitting on the side of the bed with no back support.: Minimal Assistance - Patient > 75%     Care Tool Transfers Sit to stand transfer   Sit to stand assist level: Minimal Assistance - Patient > 75%    Chair/bed transfer         Toilet transfer   Assist Level: Minimal Assistance - Patient > 75%     Care Tool Cognition  Expression of Ideas and Wants Expression of Ideas  and Wants: 3. Some difficulty - exhibits some difficulty with expressing needs and ideas (e.g, some words or finishing thoughts) or speech is not clear  Understanding Verbal and Non-Verbal Content Understanding Verbal and Non-Verbal Content: 3. Usually understands - understands most conversations, but misses some part/intent of message. Requires cues at times to understand   Memory/Recall Ability Memory/Recall Ability : Current season;That he or she is in a hospital/hospital unit   Refer to Care Plan for Long Term Goals  SHORT TERM GOAL WEEK 1 OT Short Term Goal 1 (Week 1): STG = LTG 2/2 ELOS  Recommendations for other services: Therapeutic Recreation  Pet therapy   Skilled Therapeutic Intervention Patient received upright in bed upon therapy arrival and agreeable to participate in OT evaluation. Education provided on OT purpose, therapy schedule, goals for therapy, and safety policy while in rehab. No pain reported. Patient demonstrates global endurance, dynamic balance, and gross motor coordination deficits resulting in difficulty completing BADL tasks without increased physical assist. Pt required cues for safety throughout session during problem solving with ADLs and functional mobility including safe use of RW. Pt will benefit from skilled OT services to focus on mentioned deficits. See below for ADL and functional transfer performance. Completed bathing at EOB, with noted posterior bias requiring CGA for balance with increased fatigue. Transfers completed at stand pivot and short distance ambulatory level with RW. Pt was incontinent of void, continent of BM; nurse notified and present for meds. Pt remained seated at conclusion of session with husband and nurse present at direct care handoff.    ADL ADL Eating: Set up Where Assessed-Eating: Bed level Grooming: Setup Where Assessed-Grooming: Sitting at sink Upper Body Bathing: Contact guard Where Assessed-Upper Body Bathing: Edge of  bed Lower Body Bathing: Minimal assistance Where Assessed-Lower Body Bathing: Edge of bed Upper Body Dressing: Supervision/safety Where Assessed-Upper Body Dressing: Other (Comment) (toilet) Lower Body Dressing: Moderate assistance Where Assessed-Lower Body Dressing: Other (Comment) (toilet) Toileting: Minimal assistance Where Assessed-Toileting: Teacher, adult education: Curator Method: Surveyor, minerals: Engineer, technical sales: Unable to assess Tub/Shower Transfer Method: Unable to assess Film/video editor: Unable to assess Film/video editor Method: Unable to assess Mobility  Transfers Sit to Stand: Minimal Assistance - Patient > 75% Stand to Sit: Contact Guard/Touching assist   Discharge Criteria: Patient will be discharged from OT if patient refuses treatment 3 consecutive times without medical reason, if treatment goals not met, if there is a change in medical status, if patient makes no progress towards goals or if patient is discharged from hospital.  The above assessment, treatment plan, treatment alternatives and goals were discussed and mutually agreed upon: by patient and by family  Melvyn Novas, MS, OTR/L  09/01/2022, 1:15 PM

## 2022-09-01 NOTE — Evaluation (Signed)
Physical Therapy Assessment and Plan  Patient Details  Name: Anna Gamble MRN: [id number] Date of Birth: 1935-04-19  PT Diagnosis: Abnormal posture, Abnormality of gait, Cognitive deficits, Difficulty walking, Edema, Impaired cognition, Impaired sensation, Muscle weakness, and Pain in head Rehab Potential: Good ELOS: 7-10 days   Today's Date: 09/01/2022 PT Individual Time: 0930-1025 PT Individual Time Calculation (min): 55 min    Hospital Problem: Principal Problem:   Debility   Past Medical History:  Past Medical History:  Diagnosis Date   CAD (coronary artery disease) 03/19/2014   Colon polyp    Coronary artery disease    GERD (gastroesophageal reflux disease)    Hx of colonoscopy with polypectomy 07/14/2012   medoff    Hyperlipidemia    Hypertension    Impaired fasting glucose    Morbid obesity (HCC)    S/P TAVR (transcatheter aortic valve replacement)    Severe aortic stenosis    Past Surgical History:  Past Surgical History:  Procedure Laterality Date   APPENDECTOMY     CHOLECYSTECTOMY     FOOT SURGERY     rt   KNEE SURGERY     rt   LEFT AND RIGHT HEART CATHETERIZATION WITH CORONARY ANGIOGRAM N/A 10/10/2013   Procedure: LEFT AND RIGHT HEART CATHETERIZATION WITH CORONARY ANGIOGRAM;  Surgeon: Kathleene Hazel, MD;  Location: [facility] CATH LAB;  Service: Cardiovascular;  Laterality: N/A;   RIGHT/LEFT HEART CATH AND CORONARY ANGIOGRAPHY N/A 04/06/2018   Procedure: RIGHT/LEFT HEART CATH AND CORONARY ANGIOGRAPHY;  Surgeon: Kathleene Hazel, MD;  Location: [facility] INVASIVE CV LAB;  Service: Cardiovascular;  Laterality: N/A;   TEE WITHOUT CARDIOVERSION N/A 06/06/2018   Procedure: TRANSESOPHAGEAL ECHOCARDIOGRAM (TEE);  Surgeon: Kathleene Hazel, MD;  Location: [facility] OR;  Service: Open Heart Surgery;  Laterality: N/A;   TONSILLECTOMY     TOTAL KNEE ARTHROPLASTY  02/01/2011   Procedure: TOTAL KNEE ARTHROPLASTY;  Surgeon: Nestor Lewandowsky;  Location: [facility] OR;  Service:  Orthopedics;  Laterality: Right;  DEPUY/SIGMA   TRANSCATHETER AORTIC VALVE REPLACEMENT, TRANSFEMORAL N/A 06/06/2018   Procedure: TRANSCATHETER AORTIC VALVE REPLACEMENT, TRANSFEMORAL;  Surgeon: Kathleene Hazel, MD;  Location: [facility] OR;  Service: Open Heart Surgery;  Laterality: N/A;    Assessment & Plan Clinical Impression: Patient is a 87 y.o. year old female with history of HTN, morbid obesity- BMI 41, aortic stenosis s/p TVAR, T2DM, severe OA bilateral knees who was admitted on 08/27/22 with reports of progressive weakness, confusion with fall, lethargy and reports of chills. She was found to have SIRS with encephalopathy, low grade fever with WBC-12.0  and lactic acidosis.  COVID-19 test negative.  CT head negative. She was started on IV antibiotics and IVF. UA negative. CXR  negative. BC X 2 pending but no growth X 4 days.  Metformin held with insulin sliding scale for blood sugar management.  Blood pressures noted to be soft therefore BP meds held.  Lactic acidosis and leukocytosis has resolved.    Maxillofacial CT done due to reports of facial pain with headaches as well as nasal drainage and was negative for sinusitis.  Lethargy has resolved and PT OT has been working with patient who is noted to be deconditioned and required min assist overall. Prior to admission patient was modified independent and was gardening with use of upright rolling walker.  Used wheelchair in home to do housework/get around (too slow). CIR recommended due to functional decline.  Patient currently requires min with mobility secondary to muscle weakness, decreased cardiorespiratoy endurance,  decreased problem solving, and decreased standing balance, decreased postural control, and decreased balance strategies.  Prior to hospitalization, patient was modified independent  with mobility and lived with Spouse, Son in a House home.  Home access is  Ramped entrance.  Patient will benefit from skilled PT intervention to  maximize safe functional mobility, minimize fall risk, and decrease caregiver burden for planned discharge home with intermittent assist.  Anticipate patient will benefit from follow up [facility] at discharge.  PT - End of Session Activity Tolerance: Tolerates 30+ min activity with multiple rests Endurance Deficit: Yes Endurance Deficit Description: required rest breaks throughout PT Assessment Rehab Potential (ACUTE/IP ONLY): Good PT Barriers to Discharge: Decreased caregiver support;Weight PT Barriers to Discharge Comments: pain, husband and son unable to provide physical assist PT Patient demonstrates impairments in the following area(s): Balance;Edema;Endurance;Nutrition;Pain;Sensory;Safety;Skin Integrity PT Transfers Functional Problem(s): Bed Mobility;Bed to Chair;Car;Furniture PT Locomotion Functional Problem(s): Ambulation;Wheelchair Mobility;Stairs PT Plan PT Intensity: Minimum of 1-2 x/day ,45 to 90 minutes PT Frequency: 5 out of 7 days PT Duration Estimated Length of Stay: 7-10 days PT Treatment/Interventions: Ambulation/gait training;Discharge planning;Functional mobility training;Psychosocial support;Therapeutic Activities;Visual/perceptual remediation/compensation;Balance/vestibular training;Disease management/prevention;Neuromuscular re-education;Skin care/wound management;Therapeutic Exercise;Wheelchair propulsion/positioning;Cognitive remediation/compensation;DME/adaptive equipment instruction;Pain management;UE/LE Strength taining/ROM;Community reintegration;Patient/family education;Stair training;UE/LE Coordination activities PT Transfers Anticipated Outcome(s): Mod I with LRAD PT Locomotion Anticipated Outcome(s): Mod I with LRAD PT Recommendation Recommendations for Other Services: Therapeutic Recreation consult Therapeutic Recreation Interventions: Pet therapy;Stress management Follow Up Recommendations: Home health PT Patient destination: Home Equipment Recommended: To be  determined   PT Evaluation Precautions/Restrictions Precautions Precautions: Fall Restrictions Weight Bearing Restrictions: No Pain Interference Pain Interference Pain Effect on Sleep: 1. Rarely or not at all Pain Interference with Therapy Activities: 1. Rarely or not at all Pain Interference with Day-to-Day Activities: 1. Rarely or not at all Home Living/Prior Functioning Home Living Available Help at Discharge: Family;Available PRN/intermittently (husband can only provide supervision but has grandson PRN) Type of Home: House Home Access: Ramped entrance Home Layout: One level Bathroom Shower/Tub: Walk-in shower;Other (comment) (also step in tub with door with roll in shower also available that their son uses) Bathroom Toilet: Handicapped height Bathroom Accessibility: Yes Additional Comments: Pt and husband report house is handicap accessible. Has a BSC, RW, standard rollator, upright walker, and WC  Lives With: Spouse;Son Prior Function Level of Independence: Requires assistive device for independence  Able to Take Stairs?: No Driving: No Vision/Perception  Vision - History Ability to See in Adequate Light: 1 Impaired Perception Perception: Within Functional Limits Praxis Praxis: WFL  Cognition Overall Cognitive Status: Within Functional Limits for tasks assessed Arousal/Alertness: Awake/alert Orientation Level: Oriented X4 Memory: Appears intact Awareness: Appears intact Problem Solving: Impaired Safety/Judgment: Appears intact Sensation Sensation Light Touch: Impaired Detail Light Touch Impaired Details: Impaired RLE Hot/Cold: Not tested Proprioception: Appears Intact Stereognosis: Not tested Additional Comments: numbness along entire RLE Coordination Gross Motor Movements are Fluid and Coordinated: Yes Fine Motor Movements are Fluid and Coordinated: No Coordination and Movement Description: global weakness/deconditioning Finger Nose Finger Test: WFL  bilaterally but slow Heel Shin Test: slower on RLE Motor  Motor Motor: Within Functional Limits (global weakness/deconditioning)  Trunk/Postural Assessment  Cervical Assessment Cervical Assessment: Within Functional Limits Thoracic Assessment Thoracic Assessment: Exceptions to [facility] (thoracic rounding) Lumbar Assessment Lumbar Assessment: Exceptions to [facility] (posterior pelvic tilt) Postural Control Postural Control: Within Functional Limits  Balance Balance Balance Assessed: Yes Static Sitting Balance Static Sitting - Balance Support: Feet supported;No upper extremity supported Static Sitting - Level of Assistance: 5: Stand by assistance (supervision)  Dynamic Sitting Balance Dynamic Sitting - Balance Support: Feet supported;No upper extremity supported Dynamic Sitting - Level of Assistance: 5: Stand by assistance (supervision) Static Standing Balance Static Standing - Balance Support: Bilateral upper extremity supported;During functional activity (RW) Static Standing - Level of Assistance: 5: Stand by assistance (CGA) Dynamic Standing Balance Dynamic Standing - Balance Support: Bilateral upper extremity supported;During functional activity (RW) Dynamic Standing - Level of Assistance: 4: Min assist Dynamic Standing - Comments: wiht transfers and gait Extremity Assessment  RLE Assessment RLE Assessment: Exceptions to [facility] General Strength Comments: tested in [facility] RLE Strength Right Hip Flexion: 3+/5 Right Hip ABduction: 4-/5 Right Hip ADduction: 4-/5 Right Knee Flexion: 3+/5 Right Knee Extension: 3+/5 Right Ankle Dorsiflexion: 4/5 Right Ankle Plantar Flexion: 4/5 LLE Assessment LLE Assessment: Exceptions to [facility] General Strength Comments: tested sitting in WC LLE Strength Left Hip Flexion: 4-/5 Left Hip ABduction: 4-/5 Left Hip ADduction: 4-/5 Left Knee Flexion: 4-/5 Left Knee Extension: 3+/5 Left Ankle Dorsiflexion: 4/5 Left Ankle Plantar Flexion: 4/5  Care Tool Care  Tool Bed Mobility Roll left and right activity   Roll left and right assist level: Supervision/Verbal cueing    Sit to lying activity   Sit to lying assist level: Supervision/Verbal cueing    Lying to sitting on side of bed activity   Lying to sitting on side of bed assist level: the ability to move from lying on the back to sitting on the side of the bed with no back support.: Minimal Assistance - Patient > 75%     Care Tool Transfers Sit to stand transfer   Sit to stand assist level: Minimal Assistance - Patient > 75%    Chair/bed transfer   Chair/bed transfer assist level: Minimal Assistance - Patient > 75%     Toilet transfer   Assist Level: Minimal Assistance - Patient > 75%    Car transfer   Car transfer assist level: Minimal Assistance - Patient > 75%      Care Tool Locomotion Ambulation   Assist level: Minimal Assistance - Patient > 75% Assistive device: Walker-rolling Max distance: 65ft  Walk 10 feet activity   Assist level: Minimal Assistance - Patient > 75% Assistive device: Walker-rolling   Walk 50 feet with 2 turns activity Walk 50 feet with 2 turns activity did not occur: Safety/medical concerns (fatigue)      Walk 150 feet activity Walk 150 feet activity did not occur: Safety/medical concerns (fatigue)      Walk 10 feet on uneven surfaces activity   Assist level: Minimal Assistance - Patient > 75% Assistive device: Walker-rolling  Stairs Stair activity did not occur: Safety/medical concerns (fatigue)        Walk up/down 1 step activity Walk up/down 1 step or curb (drop down) activity did not occur: Safety/medical concerns (fatigue)      Walk up/down 4 steps activity Walk up/down 4 steps activity did not occur: Safety/medical concerns (fatigue)      Walk up/down 12 steps activity Walk up/down 12 steps activity did not occur: Safety/medical concerns (fatigue)      Pick up small objects from floor Pick up small object from the floor (from standing  position) activity did not occur: Safety/medical concerns (decreased balance)      Wheelchair Is the patient using a wheelchair?: Yes Type of Wheelchair: Manual   Wheelchair assist level: Supervision/Verbal cueing Max wheelchair distance: 157ft  Wheel 50 feet with 2 turns activity   Assist Level: Supervision/Verbal cueing  Wheel 150 feet  activity   Assist Level: Minimal Assistance - Patient > 75%    Refer to Care Plan for Long Term Goals  SHORT TERM GOAL WEEK 1 PT Short Term Goal 1 (Week 1): STG=LTG due to LOS  Recommendations for other services: Therapeutic Recreation  Pet therapy and Stress management  Skilled Therapeutic Intervention Evaluation completed (see details above and below) with education on PT POC and goals and individual treatment initiated with focus on functional mobility/transfers, generalized strengthening and endurance, dynamic standing balance/coordination, and ambulation. Received pt sitting in [facility] with husband present at bedside. Pt educated on PT evaluation, CIR policies, and therapy schedule and agreeable. Pt reported headache pain 3/10 (premedicated). Pt reported being mostly WC bound for past 2 years but uses upright walker whenever she is up standing/walking - husband plans on bringing it in tomorrow. Donned shoes with max A for time management purposes and pt performed WC mobility 165ft using BLE and BUE and supervision with increased time to fatigue. Transported remainder of way to ortho gym in [facility] dependently. In ortho gym pt stood from [facility] with RW and min A and performed simulated car transfer with RW and CGA with cues for safety when turning to sit. Switched out current WC for 20x18 lightweight WC (although pt would benefit from 20x16). Pt then ambulated 80ft on uneven surfaces (ramp) with RW and min A. Stood from [facility] with RW and min A and ambulated ~61ft with RW and min A back to room. Concluded session with pt sitting in WC, needs within reach, and chair pad alarm  on. Husband present at bedside.   Mobility Transfers Transfers: Sit to Stand;Stand to Sit;Stand Pivot Transfers Sit to Stand: Minimal Assistance - Patient > 75% Stand to Sit: Contact Guard/Touching assist Stand Pivot Transfers: Minimal Assistance - Patient > 75% Transfer (Assistive device): Rolling walker Locomotion  Gait Ambulation: Yes Gait Assistance: Minimal Assistance - Patient > 75% Gait Distance (Feet): 25 Feet Assistive device: Rolling walker Gait Gait: Yes Gait Pattern: Impaired Gait Pattern: Step-to pattern;Step-through pattern;Decreased step length - right;Decreased step length - left;Decreased stride length;Poor foot clearance - right;Poor foot clearance - left;Trunk flexed Gait velocity: decreased Stairs / Additional Locomotion Stairs: No Ramp: Minimal Assistance - Patient >75% (RW) Naval architect Mobility: Yes Wheelchair Assistance: Doctor, general practice: Both lower extermities;Both upper extremities Wheelchair Parts Management: Needs assistance Distance: 115ft   Discharge Criteria: Patient will be discharged from PT if patient refuses treatment 3 consecutive times without medical reason, if treatment goals not met, if there is a change in medical status, if patient makes no progress towards goals or if patient is discharged from hospital.  The above assessment, treatment plan, treatment alternatives and goals were discussed and mutually agreed upon: by patient and by family  Huntley Dec PT, DPT 09/01/2022, 12:09 PM

## 2022-09-01 NOTE — Progress Notes (Signed)
Occupational Therapy Session Note  Patient Details  Name: Anna Gamble MRN: [id number] Date of Birth: [birth year]  Today's Date: 09/01/2022 OT Individual Time: 1505-1530 OT Individual Time Calculation (min): 25 min    Short Term Goals: Week 1:  OT Short Term Goal 1 (Week 1): STG = LTG 2/2 ELOS  Skilled Therapeutic Interventions/Progress Updates:  Skilled OT intervention completed with focus on ambulatory transfers, functional endurance and BUE strengthening. Pt received seated in w/c, agreeable to session. Discomfort in bilateral knees reported, improved with ambulation; declined meds. OT offered rest breaks and repositioning throughout for pain reduction.  OT encouraged pt trial at void to manage incontinence; education provided on timed toileting as pt reports being incontinent 2/2 urgency at home. CGA sit > stand with RW, ambulated with CGA and RW to toilet. Cues needed to ambulate within RW frame for safety. Managed clothing with CGA for balance. Pt continent of void only. Pt reported irritation on sacral region, however after inspection skin noted to be intact. Pt request to apply barrier cream, set up A to self-apply and for pericare. Stood with CGA and RW, supervision for pants management. Ambulated to sink with CGA, cues again for RW positioning as well as to avoid leaning onto external surfaces as she did with both elbows on sink during hand hygiene for balance/fatigue.  Ambulated to EOB with CGA. Pt requesting to return to bed vs sit EOB 2/2 fatigue but agreeable to exercises at bed level. Min A need > supine > upright in bed.   Pt completed the following BUE exercises for strength and global endurance needed for BADLs: (With yellow theraband anchored on bed rail) 20 reps each arm Horizontal abduction/adduction Chest presses Tricep extension  Pt remained semi upright in bed, with bed alarm on/activated, and with all needs in reach at end of session.   Therapy  Documentation Precautions:  Precautions Precautions: Fall Restrictions Weight Bearing Restrictions: No    Therapy/Group: Individual Therapy  Melvyn Novas, MS, OTR/L  09/01/2022, 3:44 PM

## 2022-09-01 NOTE — Plan of Care (Signed)
  Problem: RH Balance Goal: LTG Patient will maintain dynamic standing with ADLs (OT) Description: LTG:  Patient will maintain dynamic standing balance with assist during activities of daily living (OT)  Flowsheets (Taken 09/01/2022 1226) LTG: Pt will maintain dynamic standing balance during ADLs with: Independent with assistive device   Problem: Sit to Stand Goal: LTG:  Patient will perform sit to stand in prep for activites of daily living with assistance level (OT) Description: LTG:  Patient will perform sit to stand in prep for activites of daily living with assistance level (OT) Flowsheets (Taken 09/01/2022 1226) LTG: PT will perform sit to stand in prep for activites of daily living with assistance level: Independent with assistive device   Problem: RH Bathing Goal: LTG Patient will bathe all body parts with assist levels (OT) Description: LTG: Patient will bathe all body parts with assist levels (OT) Flowsheets (Taken 09/01/2022 1226) LTG: Pt will perform bathing with assistance level/cueing: Independent with assistive device    Problem: RH Dressing Goal: LTG Patient will perform lower body dressing w/assist (OT) Description: LTG: Patient will perform lower body dressing with assist, with/without cues in positioning using equipment (OT) Flowsheets (Taken 09/01/2022 1226) LTG: Pt will perform lower body dressing with assistance level of: Independent with assistive device   Problem: RH Toileting Goal: LTG Patient will perform toileting task (3/3 steps) with assistance level (OT) Description: LTG: Patient will perform toileting task (3/3 steps) with assistance level (OT)  Flowsheets (Taken 09/01/2022 1226) LTG: Pt will perform toileting task (3/3 steps) with assistance level: Independent with assistive device   Problem: RH Toilet Transfers Goal: LTG Patient will perform toilet transfers w/assist (OT) Description: LTG: Patient will perform toilet transfers with assist, with/without cues  using equipment (OT) Flowsheets (Taken 09/01/2022 1226) LTG: Pt will perform toilet transfers with assistance level of: Independent with assistive device   Problem: RH Tub/Shower Transfers Goal: LTG Patient will perform tub/shower transfers w/assist (OT) Description: LTG: Patient will perform tub/shower transfers with assist, with/without cues using equipment (OT) Flowsheets (Taken 09/01/2022 1226) LTG: Pt will perform tub/shower stall transfers with assistance level of: Supervision/Verbal cueing   Problem: RH Awareness Goal: LTG: Patient will demonstrate awareness during functional activites type of (OT) Description: LTG: Patient will demonstrate awareness during functional activites type of (OT) Flowsheets (Taken 09/01/2022 1226) Patient will demonstrate awareness during functional activites type of: Anticipatory LTG: Patient will demonstrate awareness during functional activites type of (OT): Modified Independent

## 2022-09-01 NOTE — Progress Notes (Signed)
Inpatient Rehabilitation Center Individual Statement of Services  Patient Name:  Shandricka Manzione  Date:  09/01/2022  Welcome to the Inpatient Rehabilitation Center.  Our goal is to provide you with an individualized program based on your diagnosis and situation, designed to meet your specific needs.  With this comprehensive rehabilitation program, you will be expected to participate in at least 3 hours of rehabilitation therapies Monday-Friday, with modified therapy programming on the weekends.  Your rehabilitation program will include the following services:  Physical Therapy (PT), Occupational Therapy (OT), Speech Therapy (ST), 24 hour per day rehabilitation nursing, Therapeutic Recreaction (TR), Neuropsychology, Care Coordinator, Rehabilitation Medicine, Nutrition Services, Pharmacy Services, and Other  Weekly team conferences will be held on Wednesdays to discuss your progress.  Your Inpatient Rehabilitation Care Coordinator will talk with you frequently to get your input and to update you on team discussions.  Team conferences with you and your family in attendance may also be held.  Expected length of stay: 7-10 Days  Overall anticipated outcome:  Supervision  Depending on your progress and recovery, your program may change. Your Inpatient Rehabilitation Care Coordinator will coordinate services and will keep you informed of any changes. Your Inpatient Rehabilitation Care Coordinator's name and contact numbers are listed  below.  The following services may also be recommended but are not provided by the Inpatient Rehabilitation Center:   Home Health Rehabiltiation Services Outpatient Rehabilitation Services    Arrangements will be made to provide these services after discharge if needed.  Arrangements include referral to agencies that provide these services.  Your insurance has been verified to be:  Medicare A& B Your primary doctor is:  Berniece Andreas, MD  Pertinent information  will be shared with your doctor and your insurance company.  Inpatient Rehabilitation Care Coordinator:  Lavera Guise, [place] [phone] or [phone]  Information discussed with and copy given to patient by: Andria Rhein, 09/01/2022, 8:11 AM

## 2022-09-01 NOTE — Progress Notes (Signed)
Patient ID: Anna Gamble, female   DOB: [birth year], 87 y.o.   MRN: [id number]  Team Conference Report to Patient/Family  Team Conference discussion was reviewed with the patient and caregiver, including goals, any changes in plan of care and target discharge date.  Patient and caregiver express understanding and are in agreement.  The patient has a target discharge date of 09/11/22.  SW met with patient and spouse in the room, introduced self and explained role. Patient plans to d/c home with spouse to assist 24/7. Patient and spouse informed SW that patient will require a BSC at d/c. No additional questions or concerns currently/ SW will continue to FU.   Andria Rhein 09/01/2022, 1:46 PM

## 2022-09-01 NOTE — Progress Notes (Signed)
Inpatient Rehabilitation  Patient information reviewed and entered into eRehab system by Kelly Gentry, OTR/L, Rehab Quality Coordinator.   Information including medical coding, functional ability and quality indicators will be reviewed and updated through discharge.   

## 2022-09-01 NOTE — Progress Notes (Signed)
PROGRESS NOTE   Subjective/Complaints: Complains of headache when she is lying flat, improves when she is upright. Told she may have sinusitis, but there is no evidence on CT.   ROS: +nasal drainage   Objective:   CT Maxillofacial LTD WO CM  Result Date: 08/31/2022 CLINICAL DATA:  Initial evaluation for sinusitis. EXAM: CT PARANASAL SINUS LIMITED WITHOUT CONTRAST TECHNIQUE: Multidetector CT images of the paranasal sinuses were obtained using the standard protocol without intravenous contrast. RADIATION DOSE REDUCTION: This exam was performed according to the departmental dose-optimization program which includes automated exposure control, adjustment of the mA and/or kV according to patient size and/or use of iterative reconstruction technique. COMPARISON:  None Available. FINDINGS: Paranasal sinuses: Frontal: Normally aerated. Patent frontal sinus drainage pathways. Ethmoid: Mild mucoperiosteal thickening present about the ethmoidal air cells bilaterally. No significant sinus opacification. Maxillary: Maxillary sinuses are clear. Osseous thickening and sclerosis at the floor of the right maxillary sinus likely related to underlying chronic dental disease. Sphenoid: Normally aerated. Patent sphenoethmoidal recesses. Right ostiomeatal unit: Patent. Left ostiomeatal unit: Patent. Nasal passages: Patent. Sigmoid nasal septal deviation. No concha bullosa. Anatomy: No pneumatization superior to anterior ethmoid notches. Symmetric and intact olfactory grooves and fovea ethmoidalis, Keros II (4-94mm). Sellar sphenoid pneumatization pattern. No dehiscence of carotid or optic canals. No onodi cell. Other: Visualized intracranial contents within normal limits. Mastoid air cells and middle ear cavities are well pneumatized and free of fluid. Globes and orbital soft tissues within normal limits. Sequelae of prior bilateral ocular lens replacement. Remainder of  the visualized facial soft tissues demonstrate no significant finding. IMPRESSION: Clear sinuses with patent drainage pathways. No CT evidence for acute sinusitis at this time. Electronically Signed   By: Rise Mu M.D.   On: 08/31/2022 04:45   Recent Labs    08/31/22 0630 09/01/22 0713  WBC 8.1 9.2  HGB 11.3* 11.9*  HCT 33.8* 35.2*  PLT 215 231   Recent Labs    08/31/22 0630 09/01/22 0713  NA 138 138  K 3.6 3.5  CL 103 101  CO2 26 26  GLUCOSE 134* 142*  BUN 7* 5*  CREATININE 0.60 0.67  CALCIUM 8.2* 8.5*    Intake/Output Summary (Last 24 hours) at 09/01/2022 0908 Last data filed at 09/01/2022 0749 Gross per 24 hour  Intake 240 ml  Output --  Net 240 ml        Physical Exam: Vital Signs Blood pressure (!) 135/59, pulse 83, temperature 98.4 F (36.9 C), temperature source Oral, resp. rate 18, height 5' 0.98" (1.549 m), weight 99.1 kg, SpO2 94%. Gen: no distress, normal appearing HEENT: oral mucosa pink and moist, NCAT Cardio: Reg rate Chest: normal effort, normal rate of breathing Abd: soft, non-distended Ext: no edema Psych: pleasant, normal affect Musculoskeletal:     Cervical back: Normal range of motion.     Comments: Right shoulder with limited IR/ER d/t pain. Right knee with TKA scar and limitations in flexion.   Skin:    General: Skin is warm and dry.  Neurological:     Mental Status: She is alert.     Comments: Alert and oriented x 3. Normal insight and awareness. Intact  Memory. Normal language and speech. Cranial nerve exam unremarkable. MMT: RUE limited by shoulder pain but otherwise 4/5. LUE grossly 5/5. RLE 4/5 prox to distal. LLE 4+/5. No focal sensory abnl. No abnl tone.    Psychiatric:        Mood and Affect: Mood normal.        Behavior: Behavior normal.      Assessment/Plan: 1. Functional deficits which require 3+ hours per day of interdisciplinary therapy in a comprehensive inpatient rehab setting. Physiatrist is providing close  team supervision and 24 hour management of active medical problems listed below. Physiatrist and rehab team continue to assess barriers to discharge/monitor patient progress toward functional and medical goals  Care Tool:  Bathing    Body parts bathed by patient: Right arm, Left arm, Chest, Abdomen, Front perineal area, Buttocks, Right upper leg, Left upper leg, Face   Body parts bathed by helper: Right lower leg, Left lower leg     Bathing assist Assist Level: Minimal Assistance - Patient > 75%     Upper Body Dressing/Undressing Upper body dressing   What is the patient wearing?: Pull over shirt    Upper body assist Assist Level: Supervision/Verbal cueing    Lower Body Dressing/Undressing Lower body dressing      What is the patient wearing?: Incontinence brief, Pants     Lower body assist Assist for lower body dressing: Moderate Assistance - Patient 50 - 74%     Toileting Toileting    Toileting assist Assist for toileting: Minimal Assistance - Patient > 75%     Transfers Chair/bed transfer  Transfers assist           Locomotion Ambulation   Ambulation assist              Walk 10 feet activity   Assist           Walk 50 feet activity   Assist           Walk 150 feet activity   Assist           Walk 10 feet on uneven surface  activity   Assist           Wheelchair     Assist               Wheelchair 50 feet with 2 turns activity    Assist            Wheelchair 150 feet activity     Assist          Blood pressure (!) 135/59, pulse 83, temperature 98.4 F (36.9 C), temperature source Oral, resp. rate 18, height 5' 0.98" (1.549 m), weight 99.1 kg, SpO2 94%.  Medical Problem List and Plan: 1. Functional deficits secondary to debility and encephalopathy after SIRS/bacterial sinusitis/sepsis             -patient may shower             -ELOS/Goals: 7-10 days, supervision goals with PT and  OT 2.  Antithrombotics: -DVT/anticoagulation:  Pharmaceutical: Lovenox             -antiplatelet therapy: ASA 3.  OA bilateral knees,(hx of right TKA), pain Management: Tylenol as needed.  For therapy as needed. 4. Mood/Behavior/Sleep: LCSW to call for evaluation and support.             -- Melatonin as needed for insomnia             -antipsychotic agents:  N/A 5. Neuropsych/cognition: This patient is capable of making decisions on her own behalf. 6. Skin/Wound Care: Routine pressure-relief measures. 7. Fluids/Electrolytes/Nutrition: Monitor I's/O.  Check c-Met. 8. SIRS w/ encephalopathy, ?sinusitis: Has resolved.  -- Continue with 3 additional days of Augmentin per [facility] hospitalist for coverage of sinuses.  9. HTN: Monitor blood pressures TID--continue to hold losartan, HCTZ, and metoprolol.  Supplement potassium to goal of 4             -- Monitor BP with increase in activity.  Resume home meds as indicated. 10.  T2DM: Monitor BS ac/hs. CM diet.  Continue to hold metformin and Ozempic.             --Was started on metformin a few weeks PTA which was causing diarrha--question cause of lactic acidosis.  11.  Morbid obesity: BMI 41.  Educated on importance of weight loss as well as activity to help promote overall health and mobility. 12.  CAD: On ASA and Crestor.  Continue to hold Cozaar and metoprolol for now.             -- Monitor for any symptoms with increase in activity. 13. GERD: Resume PPI. Was on Zegerid PTA  14. Nasal drainage: start claritin at night  15. Headaches in supine position: reviewed CT and is negative for sinusitis  16. Suboptimal vitamin D: start ergocalciferol 50,000U once per week for 7 weeks  17. Suboptimal potassium: give klor klor 1x on 8/14    LOS: 1 days A FACE TO FACE EVALUATION WAS PERFORMED  Pammy Vesey P Swati Granberry 09/01/2022, 9:08 AM

## 2022-09-02 DIAGNOSIS — R5381 Other malaise: Secondary | ICD-10-CM | POA: Diagnosis not present

## 2022-09-02 LAB — BASIC METABOLIC PANEL
Anion gap: 11 (ref 5–15)
BUN: 5 mg/dL — ABNORMAL LOW (ref 8–23)
CO2: 26 mmol/L (ref 22–32)
Calcium: 8.7 mg/dL — ABNORMAL LOW (ref 8.9–10.3)
Chloride: 101 mmol/L (ref 98–111)
Creatinine, Ser: 0.92 mg/dL (ref 0.44–1.00)
GFR, Estimated: >60 mL/min (ref >60)
Glucose, Bld: 140 mg/dL — ABNORMAL HIGH (ref 70–99)
Potassium: 3.5 mmol/L (ref 3.5–5.1)
Sodium: 138 mmol/L (ref 135–145)

## 2022-09-02 LAB — CBC
HCT: 34.8 % — ABNORMAL LOW (ref 36.0–46.0)
Hemoglobin: 11.8 g/dL — ABNORMAL LOW (ref 12.0–15.0)
MCH: 32.3 pg (ref 26.0–34.0)
MCHC: 33.9 g/dL (ref 30.0–36.0)
MCV: 95.3 fL (ref 80.0–100.0)
Platelets: 266 10*3/uL (ref 150–400)
RBC: 3.65 MIL/uL — ABNORMAL LOW (ref 3.87–5.11)
RDW: 13.2 % (ref 11.5–15.5)
WBC: 10.3 10*3/uL (ref 4.0–10.5)
nRBC: 0 % (ref 0.0–0.2)

## 2022-09-02 LAB — GLUCOSE, CAPILLARY
Glucose-Capillary: 120 mg/dL — ABNORMAL HIGH (ref 70–99)
Glucose-Capillary: 111 mg/dL — ABNORMAL HIGH (ref 70–99)
Glucose-Capillary: 134 mg/dL — ABNORMAL HIGH (ref 70–99)

## 2022-09-02 MED ORDER — ZINC OXIDE 40 % EX OINT
TOPICAL_OINTMENT | Freq: Two times a day (BID) | CUTANEOUS | Status: DC
  Filled 2022-09-02 (×2): qty 57

## 2022-09-02 MED ORDER — FAMOTIDINE 20 MG PO TABS
10.0000 mg | ORAL_TABLET | Freq: Every day | ORAL | Status: DC
  Administered 2022-09-02 – 2022-09-08 (×7): 10 mg via ORAL
  Filled 2022-09-02 (×7): qty 1

## 2022-09-02 MED ORDER — LIDOCAINE VISCOUS HCL 2 % MT SOLN
15.0000 mL | Freq: Four times a day (QID) | OROMUCOSAL | Status: DC | PRN
  Filled 2022-09-02: qty 15

## 2022-09-02 NOTE — Progress Notes (Signed)
Inpatient Rehabilitation Care Coordinator Assessment and Plan Patient Details  Name: Anna Gamble MRN: [id number] Date of Birth: 05-06-1935  Today's Date: 09/02/2022  Hospital Problems: Principal Problem:   Debility  Past Medical History:  Past Medical History:  Diagnosis Date   CAD (coronary artery disease) 03/19/2014   Colon polyp    Coronary artery disease    GERD (gastroesophageal reflux disease)    Hx of colonoscopy with polypectomy 07/14/2012   medoff    Hyperlipidemia    Hypertension    Impaired fasting glucose    Morbid obesity (HCC)    S/P TAVR (transcatheter aortic valve replacement)    Severe aortic stenosis    Past Surgical History:  Past Surgical History:  Procedure Laterality Date   APPENDECTOMY     CHOLECYSTECTOMY     FOOT SURGERY     rt   KNEE SURGERY     rt   LEFT AND RIGHT HEART CATHETERIZATION WITH CORONARY ANGIOGRAM N/A 10/10/2013   Procedure: LEFT AND RIGHT HEART CATHETERIZATION WITH CORONARY ANGIOGRAM;  Surgeon: Kathleene Hazel, MD;  Location[facility] CATH LAB;  Service: Cardiovascular;  Laterality: N/A;   RIGHT/LEFT HEART CATH AND CORONARY ANGIOGRAPHY N/A 04/06/2018   Procedure: RIGHT/LEFT HEART CATH AND CORONARY ANGIOGRAPHY;  Surgeon: Kathleene Hazel, MD;  Location: [facility] INVASIVE CV LAB;  Service: Cardiovascular;  Laterality: N/A;   TEE WITHOUT CARDIOVERSION N/A 06/06/2018   Procedure: TRANSESOPHAGEAL ECHOCARDIOGRAM (TEE);  Surgeon: Kathleene Hazel, MD;  Location: [facility] OR;  Service: Open Heart Surgery;  Laterality: N/A;   TONSILLECTOMY     TOTAL KNEE ARTHROPLASTY  02/01/2011   Procedure: TOTAL KNEE ARTHROPLASTY;  Surgeon: Nestor Lewandowsky;  Location: [facility] OR;  Service: Orthopedics;  Laterality: Right;  DEPUY/SIGMA   TRANSCATHETER AORTIC VALVE REPLACEMENT, TRANSFEMORAL N/A 06/06/2018   Procedure: TRANSCATHETER AORTIC VALVE REPLACEMENT, TRANSFEMORAL;  Surgeon: Kathleene Hazel, MD;  Location: [facility] OR;  Service: Open Heart Surgery;   Laterality: N/A;   Social History:  reports that she has never smoked. She has never used smokeless tobacco. She reports that she does not drink alcohol and does not use drugs.  Family / Support Systems Marital Status: Married How Long?: n/a Patient Roles: Spouse Spouse/Significant Other: Heritage manager Children: son lives with patient Other Supports: n/a Anticipated Caregiver: Spouse Ability/Limitations of Caregiver: None Caregiver Availability: 24/7 Family Dynamics: Patient has supportive spouse and son.  Social History Preferred language: English Religion: [employer] Cultural Background: Patient independent prior and enjoys gardening Education: Landscape architect - How often do you need to have someone help you when you read instructions, pamphlets, or other written material from your doctor or pharmacy?: Rarely Writes: Yes Employment Status: Retired Date Retired/Disabled/Unemployed: n/a Marine scientist Issues: n/a Guardian/Conservator: n/a   Abuse/Neglect Abuse/Neglect Assessment Can Be Completed: Yes Physical Abuse: Denies Verbal Abuse: Denies Sexual Abuse: Denies Exploitation of patient/patient's resources: Denies Self-Neglect: Denies  Patient response to: Social Isolation - How often do you feel lonely or isolated from those around you?: Never  Emotional Status Pt's affect, behavior and adjustment status: Patient pleasant and reports enjoying CIR thus far. Recent Psychosocial Issues: Coping Psychiatric History: N/A Substance Abuse History: N/A  Patient / Family Perceptions, Expectations & Goals Pt/Family understanding of illness & functional limitations: Spouse at bedside. Premorbid pt/family roles/activities: MOD I with RW. Patient requiring some assistance from spouse with grocery shopping. Anticipated changes in roles/activities/participation: Spouse plans to assist patient 24/7 Pt/family expectations/goals: supervision  [employer] Agencies: None Premorbid Home Care/DME  Agencies: Other (Comment) ([facility], [place], [facility], RW) Transportation available at discharge: spouse Is the patient able to respond to transportation needs?: Yes In the past 12 months, has lack of transportation kept you from medical appointments or from getting medications?: No In the past 12 months, has lack of transportation kept you from meetings, work, or from getting things needed for daily living?: No Resource referrals recommended: Neuropsychology  Discharge Planning Living Arrangements: Spouse/significant other, Children Support Systems: Spouse/significant other, Children Type of Residence: Private residence (1 level home, ramp entrance) Insurance Resources: Electrical engineer Resources: Family Support, SSD Financial Screen Referred: No Living Expenses: Own Money Management: Patient, Spouse Does the patient have any problems obtaining your medications?: No Home Management: Independent/some assistance from spouse. Patient/Family Preliminary Plans: Spouse plans to contiune with assistance Care Coordinator Anticipated Follow Up Needs: HH/OP Expected length of stay: 7-10 Days  Clinical Impression SW met with patient and spouse in the room on 09/01/2022. Sw introduced self and explained role. Patient plans to d/c home with spouse to assist 24/7. Patient and spouse informed SW that patient will require a BSC at d/c. No additional questions or concerns currently/ SW will continue to FU.   Andria Rhein 09/02/2022, 8:54 AM

## 2022-09-02 NOTE — Progress Notes (Signed)
Physical Therapy Session Note  Patient Details  Name: Anna Gamble MRN: [id number] Date of Birth: 10/20/1935  Today's Date: 09/02/2022 PT Individual Time: 0830-0940 PT Individual Time Calculation (min): 70 min   Short Term Goals: Week 1:  PT Short Term Goal 1 (Week 1): STG=LTG due to LOS  Skilled Therapeutic Interventions/Progress Updates:   Received pt sitting in recliner with husband and RN present at bedside administering medications - MD arrived shortly after. Pt agreeable to PT treatment and reported having headache this morning, but improved now. Session with emphasis on functional mobility/transfers, toileting, generalized strengthening and endurance, dynamic standing balance/coordination, and gait training.  Pt's husband brought upright platform walker in that pt used primarily at home. Pt stood from recliner with upright platform walker and CGA and ambulated in/out of bathroom with upright walker and min A (cues for turning, backing up to toilet, and reminder to lock brakes prior to sitting). Pt able to manage clothing standing with CGA and able to void/perform hygiene management without assist - noted small smears of blood in brief - discussed with MD during morning rounds. Pt sat in WC at sink and washed hands and groomed with set up assist.   Pt ambulated 164ft x 2 trials with upright platform walker and CGA to/from main therapy gym. Took seated rest break and discussed pt's hobby (gardening) and how she harvests/maintains her garden using her upright platform walker to safely navigate around in the garden. Pt then performed the following standing exercises with heavy reliance on BUE support and CGA/min A for balance: -hip abduction with 1.5lb ankle weight on RLE x10, decreasing to .75lb ankle weights for additional 1x10 on RLE and 2x10 on LLE with emphasis on hip abductor strength - cues for technique, to avoid compensating, and for eccentric control -alternating marches with  0.75lb ankle weights 2x10 bilaterally -hamstring curls with 0.75lb ankle weights 2x10 bilaterally -heel raises 2x10 -mini squats 2x10 Pt required frequent rest/water breaks throughout session. Pt fatigued upon ambulating back to room, demonstrating increased foot drag, quicker cadence, and flexed trunk with increased reliance on BUE support. Pt also in a hurry to sit down, forgetting to lock brakes - safety cues provided. Concluded session with pt sitting in WC, needs within reach, and chair pad alarm on.   Therapy Documentation Precautions:  Precautions Precautions: Fall Restrictions Weight Bearing Restrictions: No  Therapy/Group: Individual Therapy [name] [name] PT, DPT 09/02/2022, 7:03 AM

## 2022-09-02 NOTE — Progress Notes (Signed)
Occupational Therapy Session Note  Patient Details  Name: Anna Gamble MRN: [id number] Date of Birth: 11-05-1935  Today's Date: 09/02/2022 OT Individual Time: 1005-1100 OT Individual Time Calculation (min): 55 min    Short Term Goals: Week 1:  OT Short Term Goal 1 (Week 1): STG = LTG 2/2 ELOS Week 2:     Skilled Therapeutic Interventions/Progress Updates:   1:1 Focus on self care retraining at shower level . Pt ambulated into the shower with regular RW. Pt able to shower sit to stand with min A - only for min A for washing her feet. Pt used the grab bar for sit to stands. Pt able to don bra and shirt with setup but needed A with fastening it in tight space of bathroom. Ambulated back out to the room to don LB clothing. Pt did demonstrate 3/4 dyspnea with activity - needing A to thread brief; able to thread pants but needed A pulling up brief. Pt dried hair at sink with hair dryer and brush. Pt husband supportive throughout session.   Discussed stepping into shower stall or walk in bathtub. Pt does need to practice stepping over a ledge.   Therapy Documentation Precautions:  Precautions Precautions: Fall Restrictions Weight Bearing Restrictions: No  Pain: Pain Assessment Pain Scale: 0-10 Pain Score: 0-No pain   Therapy/Group: Individual Therapy  Roney Mans [facility] 09/02/2022, 11:46 AM

## 2022-09-02 NOTE — Group Note (Signed)
Patient Details Name: Anna Gamble MRN: [id number] DOB: 10-13-1935 Today's Date: 09/02/2022  Time Calculation: OT Group Time Calculation OT Group Start Time: 1430 OT Group Stop Time: 1530 OT Group Time Calculation (min): 60 min      Group Description: Dance Group: Pt participated in dance group with an emphasis on social interaction, motor planning, increasing overall activity tolerance and bimanual tasks. All songs were selected by group members. Dance moves included AROM of BUE/BLE gross motor movements with an emphasis on building functional endurance.    Individual level documentation: Patient completed group from sitting level. Patientt needed supervision to complete various dance moves with OT providing visual model of dance moves.  Patient needed able to create her own modifications during group and initiate rest breaks as need. Pt left rest in wc with seat belt alarm on and husband present in room.   Pain:  0/10  Precautions:  Falls  Clide Deutscher 09/02/2022, 3:47 PM

## 2022-09-02 NOTE — Progress Notes (Signed)
PROGRESS NOTE   Subjective/Complaints: C/o pain on her tongue since admission- ordered lidocaine swish and swallow C/o MASD- felt she was unable to clean herself well before  ROS: +nasal drainage, headaches improved but still present   Objective:   No results found. Recent Labs    08/31/22 0630 09/01/22 0713  WBC 8.1 9.2  HGB 11.3* 11.9*  HCT 33.8* 35.2*  PLT 215 231   Recent Labs    08/31/22 0630 09/01/22 0713  NA 138 138  K 3.6 3.5  CL 103 101  CO2 26 26  GLUCOSE 134* 142*  BUN 7* 5*  CREATININE 0.60 0.67  CALCIUM 8.2* 8.5*    Intake/Output Summary (Last 24 hours) at 09/02/2022 1610 Last data filed at 09/02/2022 9604 Gross per 24 hour  Intake 618 ml  Output --  Net 618 ml        Physical Exam: Vital Signs Blood pressure (!) 113/46, pulse 86, temperature 98.2 F (36.8 C), temperature source Oral, resp. rate 18, height 5' 0.98" (1.549 m), weight 99.1 kg, SpO2 97%. Gen: no distress, normal appearing HEENT: oral mucosa pink and moist, NCAT Cardio: Reg rate Chest: normal effort, normal rate of breathing Abd: soft, non-distended Ext: no edema Psych: pleasant, normal affect Musculoskeletal:     Cervical back: Normal range of motion.     Comments: Right shoulder with limited IR/ER d/t pain. Right knee with TKA scar and limitations in flexion.   Skin:    General: Skin is warm and dry.  Neurological:     Mental Status: She is alert.     Comments: Alert and oriented x 3. Normal insight and awareness. Intact Memory. Normal language and speech. Cranial nerve exam unremarkable. MMT: RUE limited by shoulder pain but otherwise 4/5. LUE grossly 5/5. RLE 4/5 prox to distal. LLE 4+/5. No focal sensory abnl. No abnl tone.    Psychiatric:        Mood and Affect: Mood normal.        Behavior: Behavior normal.      Assessment/Plan: 1. Functional deficits which require 3+ hours per day of interdisciplinary  therapy in a comprehensive inpatient rehab setting. Physiatrist is providing close team supervision and 24 hour management of active medical problems listed below. Physiatrist and rehab team continue to assess barriers to discharge/monitor patient progress toward functional and medical goals  Care Tool:  Bathing    Body parts bathed by patient: Right arm, Left arm, Chest, Abdomen, Front perineal area, Buttocks, Right upper leg, Left upper leg, Face   Body parts bathed by helper: Right lower leg, Left lower leg     Bathing assist Assist Level: Minimal Assistance - Patient > 75%     Upper Body Dressing/Undressing Upper body dressing   What is the patient wearing?: Pull over shirt    Upper body assist Assist Level: Supervision/Verbal cueing    Lower Body Dressing/Undressing Lower body dressing      What is the patient wearing?: Incontinence brief, Pants     Lower body assist Assist for lower body dressing: Moderate Assistance - Patient 50 - 74%     Toileting Toileting    Toileting assist Assist for  toileting: Minimal Assistance - Patient > 75%     Transfers Chair/bed transfer  Transfers assist     Chair/bed transfer assist level: Minimal Assistance - Patient > 75%     Locomotion Ambulation   Ambulation assist      Assist level: Minimal Assistance - Patient > 75% Assistive device: Walker-rolling Max distance: 22ft   Walk 10 feet activity   Assist     Assist level: Minimal Assistance - Patient > 75% Assistive device: Walker-rolling   Walk 50 feet activity   Assist Walk 50 feet with 2 turns activity did not occur: Safety/medical concerns (fatigue)         Walk 150 feet activity   Assist Walk 150 feet activity did not occur: Safety/medical concerns (fatigue)         Walk 10 feet on uneven surface  activity   Assist     Assist level: Minimal Assistance - Patient > 75% Assistive device: Walker-rolling   Wheelchair     Assist  Is the patient using a wheelchair?: Yes Type of Wheelchair: Manual    Wheelchair assist level: Supervision/Verbal cueing Max wheelchair distance: 126ft    Wheelchair 50 feet with 2 turns activity    Assist        Assist Level: Supervision/Verbal cueing   Wheelchair 150 feet activity     Assist      Assist Level: Minimal Assistance - Patient > 75%   Blood pressure (!) 113/46, pulse 86, temperature 98.2 F (36.8 C), temperature source Oral, resp. rate 18, height 5' 0.98" (1.549 m), weight 99.1 kg, SpO2 97%.  Medical Problem List and Plan: 1. Functional deficits secondary to debility and encephalopathy after SIRS/bacterial sinusitis/sepsis             -patient may shower             -ELOS/Goals: 7-10 days, supervision goals with PT and OT 2.  Antithrombotics: -DVT/anticoagulation:  Pharmaceutical: Lovenox             -antiplatelet therapy: ASA 3.  OA bilateral knees,(hx of right TKA), pain Management: Tylenol as needed.  For therapy as needed. 4. Mood/Behavior/Sleep: LCSW to call for evaluation and support.             -- Melatonin as needed for insomnia             -antipsychotic agents:  N/A 5. Neuropsych/cognition: This patient is capable of making decisions on her own behalf. 6. Skin/Wound Care: Routine pressure-relief measures. 7. Fluids/Electrolytes/Nutrition: Monitor I's/O.  Check c-Met. 8. SIRS w/ encephalopathy, ?sinusitis: Has resolved.  -- Continue with 3 additional days of Augmentin per [facility] hospitalist for coverage of sinuses.  9. HTN: Monitor blood pressures TID--continue to hold losartan, HCTZ, and metoprolol.  Supplement potassium to goal of 4             -- Monitor BP with increase in activity.  Resume home meds as indicated. 10.  T2DM: Monitor BS ac/hs. CM diet.  Continue to hold metformin and Ozempic.             --Was started on metformin a few weeks PTA which was causing diarrha--question cause of lactic acidosis.   -d/c HS ISS, decreased CBGs  to BID AC 11.  Morbid obesity: BMI 41.  Educated on importance of weight loss as well as activity to help promote overall health and mobility. 12.  CAD: On ASA and Crestor.  Continue to hold Cozaar and metoprolol for now.             --  Monitor for any symptoms with increase in activity. 13. GERD: Pepcid ordered 10mg  daily. Was on Zegerid PTA  14. Nasal drainage: start claritin at night  15. Headaches in supine position: reviewed CT and is negative for sinusitis  16. Suboptimal vitamin D: start ergocalciferol 50,000U once per week for 7 weeks  17. Suboptimal potassium: give klor klor 1x on 8/14  18. MASD: ordered desitin  19. Pain of tongue: ordered lidocaine swish and swallow  I spent >33mins performing patient care related activities, including face to face time, documentation time, med management, discussion of meds and lab orders with patient, and overall coordination of care.      LOS: 2 days A FACE TO FACE EVALUATION WAS PERFORMED  Horton Chin 09/02/2022, 9:38 AM

## 2022-09-03 ENCOUNTER — Other Ambulatory Visit: Payer: Self-pay | Admitting: Cardiology

## 2022-09-03 LAB — GLUCOSE, CAPILLARY
Glucose-Capillary: 117 mg/dL — ABNORMAL HIGH (ref 70–99)
Glucose-Capillary: 127 mg/dL — ABNORMAL HIGH (ref 70–99)

## 2022-09-03 NOTE — Progress Notes (Signed)
PROGRESS NOTE   Subjective/Complaints: MASD improved with desitin Still has headaches in the morning but does not want additional medication, discussed drinking cup of water as soon as she wakes up  ROS: +nasal drainage, headaches improved but still present in the mornings   Objective:   No results found. Recent Labs    09/01/22 0713 09/02/22 0955  WBC 9.2 10.3  HGB 11.9* 11.8*  HCT 35.2* 34.8*  PLT 231 266   Recent Labs    09/01/22 0713 09/02/22 0955  NA 138 138  K 3.5 3.5  CL 101 101  CO2 26 26  GLUCOSE 142* 140*  BUN 5* 5*  CREATININE 0.67 0.92  CALCIUM 8.5* 8.7*    Intake/Output Summary (Last 24 hours) at 09/03/2022 1410 Last data filed at 09/03/2022 1252 Gross per 24 hour  Intake 860 ml  Output --  Net 860 ml        Physical Exam: Vital Signs Blood pressure (!) 119/54, pulse 91, temperature 98.7 F (37.1 C), temperature source Oral, resp. rate 18, height 5' 0.98" (1.549 m), weight 99.1 kg, SpO2 91%. Gen: no distress, normal appearing HEENT: oral mucosa pink and moist, NCAT Cardio: Reg rate Chest: normal effort, normal rate of breathing Abd: soft, non-distended Musculoskeletal:     Cervical back: Normal range of motion.     Comments: Right shoulder with limited IR/ER d/t pain. Right knee with TKA scar and limitations in flexion.   Skin:    General: Skin is warm and dry.  Neurological:     Mental Status: She is alert.     Comments: Alert and oriented x 3. Normal insight and awareness. Intact Memory. Normal language and speech. Cranial nerve exam unremarkable. MMT: RUE limited by shoulder pain but otherwise 4/5. LUE grossly 5/5. RLE 4/5 prox to distal. LLE 4+/5. No focal sensory abnl. No abnl tone.    Psychiatric:        Mood and Affect: Mood normal.        Behavior: Behavior normal.      Assessment/Plan: 1. Functional deficits which require 3+ hours per day of interdisciplinary therapy in a  comprehensive inpatient rehab setting. Physiatrist is providing close team supervision and 24 hour management of active medical problems listed below. Physiatrist and rehab team continue to assess barriers to discharge/monitor patient progress toward functional and medical goals  Care Tool:  Bathing    Body parts bathed by patient: Right arm, Left arm, Chest, Abdomen, Front perineal area, Buttocks, Right upper leg, Left upper leg, Face   Body parts bathed by helper: Right lower leg, Left lower leg     Bathing assist Assist Level: Minimal Assistance - Patient > 75%     Upper Body Dressing/Undressing Upper body dressing   What is the patient wearing?: Pull over shirt    Upper body assist Assist Level: Supervision/Verbal cueing    Lower Body Dressing/Undressing Lower body dressing      What is the patient wearing?: Incontinence brief, Pants     Lower body assist Assist for lower body dressing: Moderate Assistance - Patient 50 - 74%     Toileting Toileting    Toileting assist Assist for  toileting: Supervision/Verbal cueing     Transfers Chair/bed transfer  Transfers assist     Chair/bed transfer assist level: Contact Guard/Touching assist     Locomotion Ambulation   Ambulation assist      Assist level: Supervision/Verbal cueing Assistive device: Other (comment) (upright platform walker) Max distance: 125ft   Walk 10 feet activity   Assist     Assist level: Supervision/Verbal cueing Assistive device: Other (comment) (upright platform walker)   Walk 50 feet activity   Assist Walk 50 feet with 2 turns activity did not occur: Safety/medical concerns (fatigue)  Assist level: Supervision/Verbal cueing Assistive device: Other (comment) (upright platform walker)    Walk 150 feet activity   Assist Walk 150 feet activity did not occur: Safety/medical concerns (fatigue)  Assist level: Contact Guard/Touching assist Assistive device: Other (comment)  (upright platform walker)    Walk 10 feet on uneven surface  activity   Assist     Assist level: Minimal Assistance - Patient > 75% Assistive device: Walker-rolling   Wheelchair     Assist Is the patient using a wheelchair?: Yes Type of Wheelchair: Manual    Wheelchair assist level: Supervision/Verbal cueing Max wheelchair distance: 183ft    Wheelchair 50 feet with 2 turns activity    Assist        Assist Level: Supervision/Verbal cueing   Wheelchair 150 feet activity     Assist      Assist Level: Minimal Assistance - Patient > 75%   Blood pressure (!) 119/54, pulse 91, temperature 98.7 F (37.1 C), temperature source Oral, resp. rate 18, height 5' 0.98" (1.549 m), weight 99.1 kg, SpO2 91%.  Medical Problem List and Plan: 1. Functional deficits secondary to debility and encephalopathy after SIRS/bacterial sinusitis/sepsis             -patient may shower             -ELOS/Goals: 7-10 days, supervision goals with PT and OT  Grounds pass ordered 2.  Antithrombotics: -DVT/anticoagulation:  Pharmaceutical: Lovenox             -antiplatelet therapy: ASA 3.  OA bilateral knees,(hx of right TKA), pain Management: Tylenol as needed.  For therapy as needed. 4. Mood/Behavior/Sleep: LCSW to call for evaluation and support.             -- Melatonin as needed for insomnia             -antipsychotic agents:  N/A 5. Neuropsych/cognition: This patient is capable of making decisions on her own behalf. 6. Skin/Wound Care: Routine pressure-relief measures. 7. Fluids/Electrolytes/Nutrition: Monitor I's/O.  Check c-Met. 8. SIRS w/ encephalopathy, ?sinusitis: Has resolved.  -- Continue with 3 additional days of Augmentin per [facility] hospitalist for coverage of sinuses.  9. HTN: Monitor blood pressures TID--continue to hold losartan, HCTZ, and metoprolol.  Supplement potassium to goal of 4             -- Monitor BP with increase in activity.  Resume home meds as  indicated. 10.  T2DM: Monitor BS ac/hs. CM diet.  Continue to hold metformin and Ozempic.             --Was started on metformin a few weeks PTA which was causing diarrha--question cause of lactic acidosis.   -d/c HS ISS, decreased CBGs to BID AC 11.  Morbid obesity: BMI 41.  Educated on importance of weight loss as well as activity to help promote overall health and mobility. 12.  CAD: On ASA  and Crestor.  Continue to hold Cozaar and metoprolol for now.             -- Monitor for any symptoms with increase in activity. 13. GERD: Pepcid ordered 10mg  daily. Was on Zegerid PTA  14. Nasal drainage: start claritin at night  15. Headaches in supine position: reviewed CT and is negative for sinusitis  16. Suboptimal vitamin D: start ergocalciferol 50,000U once per week for 7 weeks, continue  17. Suboptimal potassium: give klor klor 1x on 8/14  18. MASD: ordered desitin, improved, continue  19. Pain of tongue: ordered lidocaine swish and swallow, continue    LOS: 3 days A FACE TO FACE EVALUATION WAS PERFORMED  Jerrett Baldinger P Ayven Pheasant 09/03/2022, 2:10 PM

## 2022-09-03 NOTE — Progress Notes (Signed)
Physical Therapy Session Note  Patient Details  Name: Anna Gamble MRN: [id number] Date of Birth: 10-29-1935  Today's Date: 09/03/2022 PT Individual Time: 7425-9563 and 8756-4332 PT Individual Time Calculation (min): 55 min and 70 min  Short Term Goals: Week 1:  PT Short Term Goal 1 (Week 1): STG=LTG due to LOS  Skilled Therapeutic Interventions/Progress Updates:   Treatment Session 1 Received pt semi-reclined in bed with husband present at bedside. Pt agreeable to PT treatment and reported headache rated 5/10 - RN aware and present to administer medications during session at end of session. Session with emphasis on functional mobility/transfers, toileting, dressing, generalized strengthening and endurance, and gait training. Pt transferred semi-reclined<>sitting L EOB with HOB elevated and supervision with increased time. Donned shoes with max A and pt reported urge to void.   Pt performed all transfers with upright platform rollator and CGA throughout session - min cues for brake management. Pt ambulated in/out of bathroom with walker and CGA. Pt able to doff brief standing with CGA, void, and perform hygiene management without assist. Required assist to doff soiled brief and thread clean brief/pants through legs. Stood from toilet with CGA and required min A to pull brief/pants over hips. Pt sat in WC at sink and brushed teeth/washed face with set up assist.  Pt then ambulated 154ft x 2 trials using upright bilateral platform walker and CGA fading to supervision to/from dayroom. Took seated rest break, then backed up to Kinetron with CGA, stepped up backwards, sat and performed BLE strengthening at 20 cm/sec for 1 minute x 4 trials with emphasis on glute/quad strength. Returned to room and concluded session with pt sitting in WC, needs within reach, and chair pad alarm on. Husband and RN present at bedside.   Treatment Session 2 Received pt sitting in [facility] with husband present at  bedside, Pt agreeable to PT treatment and denied any pain (headache from this morning gone). Session with emphasis on functional mobility/transfers, generalized strengthening and endurance, dynamic standing balance/coordination, and gait training. Pt performed all transfers with upright platform rollator and CGA/close supervision throughout session. Pt ambulated 135ft with upright platform rollator to dayroom. Took seated rest break, then ambulated to Nustep and performed seated BUE/LE strengthening on Nustep at workload 2 for 16 minutes for a total of 897 steps with emphasis on cardiovascular endurance. Initially went for 8 minutes, but pt wanting to challenge herself to see if she could make it longer. Pt demonstrating overall good endurance; did not take any rest breaks and talking with therapist entire time.   Pt then performed standing toe taps to 8in step 4x10 with BUE support and CGA for balance. Stood on Airex without UE support and min A and played horseshoes tossing with RUE x 3 trials with min A for balance. Pt with 3 posterior LOBs onto mat. Pt performed TUG with upright platform rollator and close supervision with average of 20 seconds. Pt educated on tests results/significance indicating high fall risk and importance of using an AD upon D/C - pt verbalized understanding and in agreement.  Trial 1: 25 seconds Trial 2: 17 seconds Trial 3: 18 seconds  Ambulated 139ft with upright platform rollator and supervision back to room and ambulated in/out of bathroom with CGA. Pt able to manage clothing with CGA, void, and perform hygiene management without assist. Stood at sink to wash hands with close supervision and concluded session with pt sitting in [facility] with all needs met. Confirmed that pt has grounds pass and husband  planning on taking pt outside.   Therapy Documentation Precautions:  Precautions Precautions: Fall Restrictions Weight Bearing Restrictions: No  Therapy/Group: Individual  Therapy [name] [name] PT, DPT 09/03/2022, 6:46 AM

## 2022-09-03 NOTE — IPOC Note (Signed)
Overall Plan of Care [facility]) Patient Details Name: Anna Gamble MRN: [id number] DOB: 1936/01/02  Admitting Diagnosis: Debility  Hospital Problems: Principal Problem:   Debility     Functional Problem List: Nursing Bladder, Bowel, Safety, Medication Management, Endurance, Pain  PT Balance, Edema, Endurance, Nutrition, Pain, Sensory, Safety, Skin Integrity  OT Balance, Cognition, Pain, Edema, Endurance, Motor, Skin Integrity  SLP    TR         Basic ADL's: OT Bathing, Dressing, Toileting     Advanced  ADL's: OT Simple Meal Preparation     Transfers: PT Bed Mobility, Bed to Chair, Car, Occupational psychologist, Research scientist (life sciences): PT Ambulation, Psychologist, prison and probation services, Stairs     Additional Impairments: OT None  SLP        TR      Anticipated Outcomes Item Anticipated Outcome  Self Feeding Mod I  Swallowing      Basic self-care  Mod I  Toileting  Mod I   Bathroom Transfers Mod I  Bowel/Bladder  manage bowel and bladder w toileting  Transfers  Mod I with LRAD  Locomotion  Mod I with LRAD  Communication     Cognition     Pain  < 4 with pnrs  Safety/Judgment  manage w cues   Therapy Plan: PT Intensity: Minimum of 1-2 x/day ,45 to 90 minutes PT Frequency: 5 out of 7 days PT Duration Estimated Length of Stay: 7-10 days OT Intensity: Minimum of 1-2 x/day, 45 to 90 minutes OT Frequency: 5 out of 7 days OT Duration/Estimated Length of Stay: 7-10 days     Team Interventions: Nursing Interventions Bladder Management, Disease Management/Prevention, Medication Management, Discharge Planning, Bowel Management, Patient/Family Education  PT interventions Ambulation/gait training, Discharge planning, Functional mobility training, Psychosocial support, Therapeutic Activities, Visual/perceptual remediation/compensation, Balance/vestibular training, Disease management/prevention, Neuromuscular re-education, Skin care/wound management, Therapeutic  Exercise, Wheelchair propulsion/positioning, Cognitive remediation/compensation, DME/adaptive equipment instruction, Pain management, UE/LE Strength taining/ROM, Community reintegration, Equities trader education, Museum/gallery curator, UE/LE Coordination activities  OT Interventions Warden/ranger, Discharge planning, Pain management, Self Care/advanced ADL retraining, Therapeutic Activities, UE/LE Coordination activities, Cognitive remediation/compensation, Disease mangement/prevention, Functional mobility training, Patient/family education, Skin care/wound managment, Therapeutic Exercise, Community reintegration, Fish farm manager, Neuromuscular re-education, Psychosocial support, UE/LE Strength taining/ROM, Wheelchair propulsion/positioning  SLP Interventions    TR Interventions    SW/CM Interventions Discharge Planning, Psychosocial Support, Patient/Family Education, Disease Management/Prevention   Barriers to Discharge MD  Medical stability  Nursing Decreased caregiver support, Incontinence 1 level ramped entry w spouse; paraplegic son lives with them;severe arthritis in bilateral knees, ambulates with a walker and mainly wheelchair at home,  PT Decreased caregiver support, Weight pain, husband and son unable to provide physical assist  OT Incontinence, Decreased caregiver support    SLP      SW       Team Discharge Planning: Destination: PT-Home ,OT- Home , SLP-  Projected Follow-up: PT-Home health PT, OT-  Home health OT, SLP-  Projected Equipment Needs: PT-To be determined, OT- To be determined, SLP-  Equipment Details: PT- , OT-  Patient/family involved in discharge planning: PT- Patient, Family member/caregiver,  OT-Patient, Family member/caregiver, SLP-   MD ELOS: 11 days Medical Rehab Prognosis:  Excellent Assessment: The patient has been admitted for CIR therapies with the diagnosis of debility. The team will be addressing functional mobility, strength,  stamina, balance, safety, adaptive techniques and equipment, self-care, bowel and bladder mgt, patient and caregiver education. Goals have been set at modI. Anticipated discharge  destination is home.        See Team Conference Notes for weekly updates to the plan of care

## 2022-09-03 NOTE — Progress Notes (Signed)
Occupational Therapy Session Note  Patient Details  Name: Anna Gamble MRN: [id number] Date of Birth: 1935-12-08  {CHL IP REHAB OT TIME CALCULATIONS:304400400}  {CHL IP REHAB OT TIME CALCULATIONS:304400400}  Short Term Goals: Week 1:  OT Short Term Goal 1 (Week 1): STG = LTG 2/2 ELOS  Skilled Therapeutic Interventions/Progress Updates:   Session 1: Pt received *** for skilled OT session with focus on ***. Pt agreeable to interventions, demonstrating overall *** mood. Pt reported ***/10 pain, stating "***" in reference to ***. OT offering intermediate rest breaks and positioning suggestions throughout session to address pain/fatigue and maximize participation/safety in session.    Pt remained *** with all immediate needs met at end of session. Pt continues to be appropriate for skilled OT intervention to promote further functional independence.   Session 2: Pt received *** for skilled OT session with focus on ***. Pt agreeable to interventions, demonstrating overall *** mood. Pt reported ***/10 pain, stating "***" in reference to ***. OT offering intermediate rest breaks and positioning suggestions throughout session to address pain/fatigue and maximize participation/safety in session.    Pt remained *** with all immediate needs met at end of session. Pt continues to be appropriate for skilled OT intervention to promote further functional independence.    Therapy Documentation Precautions:  Precautions Precautions: Fall Restrictions Weight Bearing Restrictions: No   Therapy/Group: Individual Therapy  Lou Cal, OTR/L, MSOT  09/03/2022, 10:34 PM

## 2022-09-03 NOTE — Plan of Care (Signed)
  Problem: Consults Goal: RH GENERAL PATIENT EDUCATION Description: See Patient Education module for education specifics. Outcome: Progressing   Problem: RH BOWEL ELIMINATION Goal: RH STG MANAGE BOWEL WITH ASSISTANCE Description: STG Manage Bowel with mod I Assistance. Outcome: Progressing   Problem: RH BOWEL ELIMINATION Goal: RH STG MANAGE BOWEL W/MEDICATION W/ASSISTANCE Description: STG Manage Bowel with Medication with mod I Assistance. Outcome: Progressing   Problem: RH BLADDER ELIMINATION Goal: RH STG MANAGE BLADDER WITH ASSISTANCE Description: STG Manage Bladder With toileting Assistance; wears briefs; MOD I assist Outcome: Progressing

## 2022-09-03 NOTE — Progress Notes (Signed)
Occupational Therapy Session Note  Patient Details  Name: Anna Gamble MRN: [id number] Date of Birth: 1935/05/07  Today's Date: 09/03/2022 OT Individual Time: 0905-1005 OT Individual Time Calculation (min): 60 min    Short Term Goals: Week 1:  OT Short Term Goal 1 (Week 1): STG = LTG 2/2 ELOS  Skilled Therapeutic Interventions/Progress Updates:  Skilled OT intervention completed with focus on ambulatory endurance, shower transfers and DC planning. Pt received seated in w/c, agreeable to session. Discomfort reported in B ankles with mobility; pre-medicated. OT offered rest breaks and repositioning throughout for pain reduction.  Pt declined self-care needs. Husband provided picture of walk in tub with door, and walk in shower at pt's home. Discussed current recommendation to use AD for mobility 2/2 fatigue, endurance and balance deficits with pt selecting preference of upright rollator vs RW but doesn't normally use in home but only for working in the garden in backyard. OT suggested trial of using the upright rollator for shower transfer.  Supervision sit > stand with upright rollator, then ambulated > 150 ft with device and CGA to ADL bathroom. Pt with good upright posture, however with fatigue tended to have increased trunk flexion with cues needed to correct. Improved endurance noted with absent SOB this trial. Extended rest break provided for fatigue.  OT set up simulated shower with shower frame and desk as grab bar and TTB for built in seat. Pt required min A to stand from low level couch with standing rollator. Ambulated with CGA to frame, then entered forwards with CGA using "grab bar" to enter and sit on TTB. Pt reports not having grab bar currently but OT advised installation of one and maybe one on outside of shower door. Also advised pt to side step for fall prevention and for facing grab bar vs forward entry 2/2 feet becoming entangled though no formal LOB noted. Discussed  with husband and son at end of session about optimal placement for grab bars if planning to use walk in shower vs step in tub.  Education provided about stabilizing the rollator up against wall or other surface prior to sitting on the seat. Pt able to ambulate with CGA, demo safe positioning of rollator onto footboard of bed in room, and CGA to turn and sit though hip width with min difficulty fitting. Stood with CGA and turning to face rollator. Ambulated with close supervision > 150 ft to gym with cues needed again for upright posture. Extended rest break provided for fatigue and water per request. Ambulated again about 100 ft with rollator at supervision level back to room > toilet for toileting trial.  Supervision for toileting needs, with pt stating she voided but didn't appear to have any in commode. Ambulated with supervision to w/c. Pt remained seated in w/c, with chair alarm on/activated, and with all needs in reach at end of session.   Therapy Documentation Precautions:  Precautions Precautions: Fall Restrictions Weight Bearing Restrictions: No    Therapy/Group: Individual Therapy  Melvyn Novas, MS, OTR/L  09/03/2022, 11:35 AM

## 2022-09-04 LAB — GLUCOSE, CAPILLARY
Glucose-Capillary: 122 mg/dL — ABNORMAL HIGH (ref 70–99)
Glucose-Capillary: 124 mg/dL — ABNORMAL HIGH (ref 70–99)

## 2022-09-04 NOTE — Progress Notes (Signed)
PROGRESS NOTE   Subjective/Complaints:  HA after therapy , no other c/os, no nasal drainage  ROS: +nasal drainage, headaches improved but still present in the mornings   Objective:   No results found. Recent Labs    09/02/22 0955  WBC 10.3  HGB 11.8*  HCT 34.8*  PLT 266   Recent Labs    09/02/22 0955  NA 138  K 3.5  CL 101  CO2 26  GLUCOSE 140*  BUN 5*  CREATININE 0.92  CALCIUM 8.7*    Intake/Output Summary (Last 24 hours) at 09/04/2022 1310 Last data filed at 09/04/2022 0727 Gross per 24 hour  Intake 480 ml  Output --  Net 480 ml        Physical Exam: Vital Signs Blood pressure (!) 131/52, pulse 88, temperature 98.3 F (36.8 C), temperature source Oral, resp. rate 18, height 5' 0.98" (1.549 m), weight 99.1 kg, SpO2 96%. Gen: no distress, normal appearing   General: No acute distress Mood and affect are appropriate Heart: Regular rate and rhythm no rubs murmurs or extra sounds Lungs: Clear to auscultation, breathing unlabored, no rales or wheezes Abdomen: Positive bowel sounds, soft nontender to palpation, nondistended   Musculoskeletal:     Cervical back: Normal range of motion.     Comments: Right shoulder with limited IR/ER d/t pain. Right knee with TKA scar and limitations in flexion.   Skin:    General: Skin is warm and dry.  Neurological:     Mental Status: She is alert.     Comments: Alert and oriented x 3. Normal insight and awareness. Intact Memory. Normal language and speech. Cranial nerve exam unremarkable. MMT: RUE limited by shoulder pain but otherwise 4/5. LUE grossly 5/5. RLE 4/5 prox to distal. LLE 4+/5. No focal sensory abnl. No abnl tone.    Psychiatric:        Mood and Affect: Mood normal.        Behavior: Behavior normal.      Assessment/Plan: 1. Functional deficits which require 3+ hours per day of interdisciplinary therapy in a comprehensive inpatient rehab  setting. Physiatrist is providing close team supervision and 24 hour management of active medical problems listed below. Physiatrist and rehab team continue to assess barriers to discharge/monitor patient progress toward functional and medical goals  Care Tool:  Bathing    Body parts bathed by patient: Right arm, Left arm, Chest, Abdomen, Front perineal area, Buttocks, Right upper leg, Left upper leg, Face   Body parts bathed by helper: Right lower leg, Left lower leg     Bathing assist Assist Level: Minimal Assistance - Patient > 75%     Upper Body Dressing/Undressing Upper body dressing   What is the patient wearing?: Pull over shirt    Upper body assist Assist Level: Supervision/Verbal cueing    Lower Body Dressing/Undressing Lower body dressing      What is the patient wearing?: Incontinence brief, Pants     Lower body assist Assist for lower body dressing: Moderate Assistance - Patient 50 - 74%     Toileting Toileting    Toileting assist Assist for toileting: Supervision/Verbal cueing     Transfers Chair/bed  transfer  Transfers assist     Chair/bed transfer assist level: Contact Guard/Touching assist     Locomotion Ambulation   Ambulation assist      Assist level: Supervision/Verbal cueing Assistive device: Other (comment) (upright platform walker) Max distance: 177ft   Walk 10 feet activity   Assist     Assist level: Supervision/Verbal cueing Assistive device: Other (comment) (upright platform walker)   Walk 50 feet activity   Assist Walk 50 feet with 2 turns activity did not occur: Safety/medical concerns (fatigue)  Assist level: Supervision/Verbal cueing Assistive device: Other (comment) (upright platform walker)    Walk 150 feet activity   Assist Walk 150 feet activity did not occur: Safety/medical concerns (fatigue)  Assist level: Contact Guard/Touching assist Assistive device: Other (comment) (upright platform walker)     Walk 10 feet on uneven surface  activity   Assist     Assist level: Minimal Assistance - Patient > 75% Assistive device: Walker-rolling   Wheelchair     Assist Is the patient using a wheelchair?: Yes Type of Wheelchair: Manual    Wheelchair assist level: Supervision/Verbal cueing Max wheelchair distance: 144ft    Wheelchair 50 feet with 2 turns activity    Assist        Assist Level: Supervision/Verbal cueing   Wheelchair 150 feet activity     Assist      Assist Level: Minimal Assistance - Patient > 75%   Blood pressure (!) 131/52, pulse 88, temperature 98.3 F (36.8 C), temperature source Oral, resp. rate 18, height 5' 0.98" (1.549 m), weight 99.1 kg, SpO2 96%.  Medical Problem List and Plan: 1. Functional deficits secondary to debility and encephalopathy after SIRS/bacterial sinusitis/sepsis             -patient may shower             -ELOS/Goals: 7-10 days, supervision goals with PT and OT  Grounds pass ordered 2.  Antithrombotics: -DVT/anticoagulation:  Pharmaceutical: Lovenox             -antiplatelet therapy: ASA 3.  OA bilateral knees,(hx of right TKA), pain Management: Tylenol as needed.  For therapy as needed. 4. Mood/Behavior/Sleep: LCSW to call for evaluation and support.             -- Melatonin as needed for insomnia             -antipsychotic agents:  N/A 5. Neuropsych/cognition: This patient is capable of making decisions on her own behalf. 6. Skin/Wound Care: Routine pressure-relief measures. 7. Fluids/Electrolytes/Nutrition: Monitor I's/O.  Check c-Met. 8. SIRS w/ encephalopathy, ?sinusitis: Has resolved.  -- Continue with 3 additional days of Augmentin per [facility] hospitalist for coverage of sinuses.  9. HTN: Monitor blood pressures TID--continue to hold losartan, HCTZ, and metoprolol.   Vitals:   09/03/22 1925 09/04/22 0338  BP: 133/65 (!) 131/52  Pulse: 81 88  Resp: 18 18  Temp: 98.3 F (36.8 C) 98.3 F (36.8 C)  SpO2:  97% 96%   Supplement potassium to goal of 4             -- Monitor BP with increase in activity.  Resume home meds as indicated. 10.  T2DM: Monitor BS ac/hs. CM diet.  Continue to hold metformin and Ozempic.             --Was started on metformin a few weeks PTA which was causing diarrha--question cause of lactic acidosis.   -d/c HS ISS, decreased CBGs to BID AC CBG (  last 3)  Recent Labs    09/03/22 0558 09/03/22 1644 09/04/22 0557  GLUCAP 117* 127* 122*  Controlled 8/17  11.  Morbid obesity: BMI 41.  Educated on importance of weight loss as well as activity to help promote overall health and mobility. 12.  CAD: On ASA and Crestor.  Continue to hold Cozaar and metoprolol for now.             -- Monitor for any symptoms with increase in activity. 13. GERD: Pepcid ordered 10mg  daily. Was on Zegerid PTA  14. Nasal drainage: start claritin at night  15. Headaches in supine position: reviewed CT and is negative for sinusitis  16. Suboptimal vitamin D: start ergocalciferol 50,000U once per week for 7 weeks, continue  17. Suboptimal potassium: give klor klor 1x on 8/14  18. MASD: ordered desitin, improved, continue  19. Pain of tongue: ordered lidocaine swish and swallow, continue    LOS: 4 days A FACE TO FACE EVALUATION WAS PERFORMED  Erick Colace 09/04/2022, 1:10 PM

## 2022-09-04 NOTE — Plan of Care (Signed)
  Problem: Consults Goal: RH GENERAL PATIENT EDUCATION Description: See Patient Education module for education specifics. Outcome: Progressing   Problem: RH BOWEL ELIMINATION Goal: RH STG MANAGE BOWEL WITH ASSISTANCE Description: STG Manage Bowel with mod I Assistance. Outcome: Progressing Goal: RH STG MANAGE BOWEL W/MEDICATION W/ASSISTANCE Description: STG Manage Bowel with Medication with mod I Assistance. Outcome: Progressing   Problem: RH BLADDER ELIMINATION Goal: RH STG MANAGE BLADDER WITH ASSISTANCE Description: STG Manage Bladder With toileting Assistance; wears briefs; MOD I assist Outcome: Progressing

## 2022-09-04 NOTE — Progress Notes (Signed)
Physical Therapy Session Note  Patient Details  Name: Anna Gamble MRN: [id number] Date of Birth: 01/19/1935  Today's Date: 09/04/2022 PT Individual Time: 8657-8469 + 6295-2841 PT Individual Time Calculation (min): 55 min + 40 min  Short Term Goals: Week 1:  PT Short Term Goal 1 (Week 1): STG=LTG due to LOS  Skilled Therapeutic Interventions/Progress Updates:  Session 1: Chart reviewed and pt agreeable to therapy. Pt received seated in bed with no c/o pain. Session focused on activity tolernce and ambulation endurance and safety to promote safe home access. Pt initiated session with transfer to EOB using S. Pt then completed amb of 265ft to therapy gym using S + rollator. Pt completed 12 mins on NuStep for interval training at workload 2-10. Pt then completed blocked practice of amb with rollator using S. Pt noted to have good safety awareness with rollator and demonstrated safe sitting breaks. Pt then completed blocked practice of balance exercises of neutral stance, narrow stance, and eyes closed using CGA. At end of session, pt was left seated in [facility] with alarm engaged, nurse call bell and all needs in reach.  Session 2: Chart reviewed and pt agreeable to therapy. Pt received seated in WC with 4/10 c/o pain as a headache. Session focused on functional transfers and amb quality to promote safe home access. Pt initiated session with amb to toilet using CGA + RW. Pt then completed 30 mins amb around hospital with S + rollator. Pt displayed good ability to take safe breaks with rollator, and pt able to amb >380ft for each round of amb, displaying good activity tolerance. Session education emphasized safety judgement for rest breaks. At end of session, pt was left seated in [facility] with alarm engaged, nurse call bell and all needs in reach.    Therapy Documentation Precautions:  Precautions Precautions: Fall Restrictions Weight Bearing Restrictions: No General:       Therapy/Group:  Individual Therapy  Dionne Milo, PT, DPT 09/04/2022, 8:59 AM

## 2022-09-05 LAB — GLUCOSE, CAPILLARY
Glucose-Capillary: 119 mg/dL — ABNORMAL HIGH (ref 70–99)
Glucose-Capillary: 126 mg/dL — ABNORMAL HIGH (ref 70–99)

## 2022-09-05 NOTE — Plan of Care (Signed)
  Problem: RH SAFETY Goal: RH STG ADHERE TO SAFETY PRECAUTIONS W/ASSISTANCE/DEVICE Description: STG Adhere to Safety Precautions With cues Assistance/Device. Outcome: Progressing   Problem: RH KNOWLEDGE DEFICIT GENERAL Goal: RH STG INCREASE KNOWLEDGE OF SELF CARE AFTER HOSPITALIZATION Description: Patient and spouse will be able to manage care at discharge using educational resources independently Outcome: Progressing

## 2022-09-05 NOTE — Progress Notes (Signed)
PROGRESS NOTE   Subjective/Complaints:  HA after therapy , no other c/os, no nasal drainage  ROS: +nasal drainage, headaches improved but still present in the mornings   Objective:   No results found. Recent Labs    09/02/22 0955  WBC 10.3  HGB 11.8*  HCT 34.8*  PLT 266   Recent Labs    09/02/22 0955  NA 138  K 3.5  CL 101  CO2 26  GLUCOSE 140*  BUN 5*  CREATININE 0.92  CALCIUM 8.7*    Intake/Output Summary (Last 24 hours) at 09/05/2022 0917 Last data filed at 09/05/2022 0716 Gross per 24 hour  Intake 720 ml  Output --  Net 720 ml        Physical Exam: Vital Signs Blood pressure (!) 107/52, pulse (!) 101, temperature 98.1 F (36.7 C), temperature source Oral, resp. rate 18, height 5' 0.98" (1.549 m), weight 99.1 kg, SpO2 93%. Gen: no distress, normal appearing   General: No acute distress Mood and affect are appropriate Heart: Regular rate and rhythm no rubs murmurs or extra sounds Lungs: Clear to auscultation, breathing unlabored, no rales or wheezes Abdomen: Positive bowel sounds, soft nontender to palpation, nondistended   Musculoskeletal:     Cervical back: Normal range of motion.     Comments: Right shoulder with limited IR/ER d/t pain. Right knee with TKA scar and limitations in flexion.   Skin:    General: Skin is warm and dry.  Neurological:     Mental Status: She is alert.     Comments: Alert and oriented x 3. Normal insight and awareness. Intact Memory. Normal language and speech. Cranial nerve exam unremarkable. MMT: RUE limited by shoulder pain but otherwise 4/5. LUE grossly 5/5. RLE 4/5 prox to distal. LLE 4+/5. No focal sensory abnl. No abnl tone.    Psychiatric:        Mood and Affect: Mood normal.        Behavior: Behavior normal.      Assessment/Plan: 1. Functional deficits which require 3+ hours per day of interdisciplinary therapy in a comprehensive inpatient rehab  setting. Physiatrist is providing close team supervision and 24 hour management of active medical problems listed below. Physiatrist and rehab team continue to assess barriers to discharge/monitor patient progress toward functional and medical goals  Care Tool:  Bathing    Body parts bathed by patient: Right arm, Left arm, Chest, Abdomen, Front perineal area, Buttocks, Right upper leg, Left upper leg, Face   Body parts bathed by helper: Right lower leg, Left lower leg     Bathing assist Assist Level: Minimal Assistance - Patient > 75%     Upper Body Dressing/Undressing Upper body dressing   What is the patient wearing?: Pull over shirt    Upper body assist Assist Level: Supervision/Verbal cueing    Lower Body Dressing/Undressing Lower body dressing      What is the patient wearing?: Incontinence brief, Pants     Lower body assist Assist for lower body dressing: Moderate Assistance - Patient 50 - 74%     Toileting Toileting    Toileting assist Assist for toileting: Supervision/Verbal cueing     Transfers  Chair/bed transfer  Transfers assist     Chair/bed transfer assist level: Contact Guard/Touching assist     Locomotion Ambulation   Ambulation assist      Assist level: Supervision/Verbal cueing Assistive device: Other (comment) (upright platform walker) Max distance: 163ft   Walk 10 feet activity   Assist     Assist level: Supervision/Verbal cueing Assistive device: Other (comment) (upright platform walker)   Walk 50 feet activity   Assist Walk 50 feet with 2 turns activity did not occur: Safety/medical concerns (fatigue)  Assist level: Supervision/Verbal cueing Assistive device: Other (comment) (upright platform walker)    Walk 150 feet activity   Assist Walk 150 feet activity did not occur: Safety/medical concerns (fatigue)  Assist level: Contact Guard/Touching assist Assistive device: Other (comment) (upright platform walker)     Walk 10 feet on uneven surface  activity   Assist     Assist level: Minimal Assistance - Patient > 75% Assistive device: Walker-rolling   Wheelchair     Assist Is the patient using a wheelchair?: Yes Type of Wheelchair: Manual    Wheelchair assist level: Supervision/Verbal cueing Max wheelchair distance: 175ft    Wheelchair 50 feet with 2 turns activity    Assist        Assist Level: Supervision/Verbal cueing   Wheelchair 150 feet activity     Assist      Assist Level: Minimal Assistance - Patient > 75%   Blood pressure (!) 107/52, pulse (!) 101, temperature 98.1 F (36.7 C), temperature source Oral, resp. rate 18, height 5' 0.98" (1.549 m), weight 99.1 kg, SpO2 93%.  Medical Problem List and Plan: 1. Functional deficits secondary to debility and encephalopathy after SIRS/bacterial sinusitis/sepsis             -patient may shower             -ELOS/Goals: 7-10 days, supervision goals with PT and OT  Grounds pass ordered 2.  Antithrombotics: -DVT/anticoagulation:  Pharmaceutical: Lovenox             -antiplatelet therapy: ASA 3.  OA bilateral knees,(hx of right TKA), pain Management: Tylenol as needed.  For therapy as needed. 4. Mood/Behavior/Sleep: LCSW to call for evaluation and support.             -- Melatonin as needed for insomnia             -antipsychotic agents:  N/A 5. Neuropsych/cognition: This patient is capable of making decisions on her own behalf. 6. Skin/Wound Care: Routine pressure-relief measures. 7. Fluids/Electrolytes/Nutrition: Monitor I's/O.  Check c-Met. 8. SIRS w/ encephalopathy, ?sinusitis: Has resolved.  -- Continue with 3 additional days of Augmentin per [facility] hospitalist for coverage of sinuses.  9. HTN: Monitor blood pressures TID--continue to hold losartan, HCTZ, and metoprolol.   Vitals:   09/04/22 2007 09/05/22 0342  BP: 121/76 (!) 107/52  Pulse: 81 (!) 101  Resp: 18 18  Temp: 98.1 F (36.7 C) 98.1 F (36.7 C)   SpO2: 95% 93%  Mild tachy last noc, HR nl on exam today , BP a little soft would hold off on metoprolol for now              -- Monitor BP with increase in activity.  Resume home meds as indicated. 10.  T2DM: Monitor BS ac/hs. CM diet.  Continue to hold metformin and Ozempic.             --Was started on metformin a few weeks PTA which  was causing diarrha--question cause of lactic acidosis.   -d/c HS ISS, decreased CBGs to BID AC CBG (last 3)  Recent Labs    09/04/22 0557 09/04/22 1702 09/05/22 0613  GLUCAP 122* 124* 119*  Controlled 8/18  11.  Morbid obesity: BMI 41.  Educated on importance of weight loss as well as activity to help promote overall health and mobility. 12.  CAD: On ASA and Crestor.  Continue to hold Cozaar and metoprolol for now.             -- Monitor for any symptoms with increase in activity. 13. GERD: Pepcid ordered 10mg  daily. Was on Zegerid PTA  14. Nasal drainage: start claritin at night  15. Headaches in supine position: reviewed CT and is negative for sinusitis  16. Suboptimal vitamin D: start ergocalciferol 50,000U once per week for 7 weeks, continue  17. Suboptimal potassium: give klor klor 1x on 8/14  18. MASD: ordered desitin, improved, continue  19. Pain of tongue: ordered lidocaine swish and swallow, continue    LOS: 5 days A FACE TO FACE EVALUATION WAS PERFORMED  Erick Colace 09/05/2022, 9:17 AM

## 2022-09-06 LAB — CBC
HCT: 33.1 % — ABNORMAL LOW (ref 36.0–46.0)
Hemoglobin: 10.8 g/dL — ABNORMAL LOW (ref 12.0–15.0)
MCH: 31 pg (ref 26.0–34.0)
MCHC: 32.6 g/dL (ref 30.0–36.0)
MCV: 95.1 fL (ref 80.0–100.0)
Platelets: 344 10*3/uL (ref 150–400)
RBC: 3.48 MIL/uL — ABNORMAL LOW (ref 3.87–5.11)
RDW: 13.7 % (ref 11.5–15.5)
WBC: 7.5 10*3/uL (ref 4.0–10.5)
nRBC: 0 % (ref 0.0–0.2)

## 2022-09-06 LAB — BASIC METABOLIC PANEL
Anion gap: 7 (ref 5–15)
BUN: 6 mg/dL — ABNORMAL LOW (ref 8–23)
CO2: 27 mmol/L (ref 22–32)
Calcium: 8.4 mg/dL — ABNORMAL LOW (ref 8.9–10.3)
Chloride: 105 mmol/L (ref 98–111)
Creatinine, Ser: 1.01 mg/dL — ABNORMAL HIGH (ref 0.44–1.00)
GFR, Estimated: 55 mL/min — ABNORMAL LOW (ref >60)
Glucose, Bld: 127 mg/dL — ABNORMAL HIGH (ref 70–99)
Potassium: 3.5 mmol/L (ref 3.5–5.1)
Sodium: 139 mmol/L (ref 135–145)

## 2022-09-06 LAB — GLUCOSE, CAPILLARY
Glucose-Capillary: 127 mg/dL — ABNORMAL HIGH (ref 70–99)
Glucose-Capillary: 118 mg/dL — ABNORMAL HIGH (ref 70–99)

## 2022-09-06 NOTE — Progress Notes (Signed)
Physical Therapy Session Note  Patient Details  Name: Anna Gamble MRN: [id number] Date of Birth: 30-Oct-1935  Today's Date: 09/06/2022 PT Individual Time: 4034-7425 and 1430-1455 PT Individual Time Calculation (min): 58 min and 25 min  Short Term Goals: Week 1:  PT Short Term Goal 1 (Week 1): STG=LTG due to LOS  Skilled Therapeutic Interventions/Progress Updates:   Treatment Session 1 Received pt sitting in [facility] with husband present at bedside. Pt agreeable to PT treatment and denied any pain during session. Informed pt of new D/C date of Wednesday and pt and husband excited! Session with emphasis on functional mobility/transfers, generalized strengthening and endurance, dynamic standing balance/coordination, simulated car transfers, and gait training. Sat in [facility] and brushed teeth at sink with set up assist.   Pt performed all transfers with upright platform rollator and supervision throughout session. Pt ambulated >354ft with upright platform rollator and supervision to ortho gym. Pt performed ambulatory simulated car transfer with upright platform rollator and supervision, then ambulated 31ft on uneven surfaces (ramp) with upright platform rollator and supervision. Pt able to stand and pick up object from floor with upright platform rollator and close supervision. Pt then ambulated 121ft with upright platform rollator and supervision to main therapy gym. Pt navigated 8 6in steps with 2 handrails and close supervision ascending with a step through and descending with a step through pattern with heavy reliance on BUE support - encouraged step to pattern when ascending for safety and pt verbalized understanding.   Pt ambulated additional 116ft with upright platform rollator and supervision to dayroom. Discussed lifestyle changes (specifically diet changes), activity modifications, and energy conservation strategies to implement at home. Pt ambulated 158ft with upright platform rollator and  supervision back to room. Went through sensation, MMT, and pain interference questionnaire in preparation for discharge. Concluded session with pt sitting in WC, needs within reach, and chair pad alarm on.   Treatment Session 2 Received pt sitting in [facility] with husband and pastor present at bedside. Pt agreeable to PT treatment and denied any pain during session. Session with emphasis on functional mobility/transfers, dynamic standing balance/coordination, toileting, and gait training. Pt performed all transfers with upright platform rollator and supervision throughout session. Pt ambulated 161ft x 2 trials with upright platform rollator and supervision to/from dayroom. Worked on dynamic standing balance performing toe taps to 3 cones with mod HHA x 1 trial - noted increased unsteadiness and difficulty tapping top of cone. Allowed pt to use BUE support on rollator and pt performed x3 additional trials bilaterally with CGA for balance - pt able to tap top of cones and reported feeling more stable with UE support. Returned to room and ambulated into bathroom with upright platform rollator and supervision. Pt able to manage clothing without assist and left in care of NT for toileting.   Therapy Documentation Precautions:  Precautions Precautions: Fall Restrictions Weight Bearing Restrictions: No  Therapy/Group: Individual Therapy [name] [name] PT, DPT 09/06/2022, 6:49 AM

## 2022-09-06 NOTE — Progress Notes (Signed)
PROGRESS NOTE   Subjective/Complaints: No complaints this morning Husband is at bedside Therapy feels she will be able to d/c early on Wednesday  ROS: +nasal drainage, headaches improved but still present in the mornings, +MASD   Objective:   No results found. Recent Labs    09/06/22 0549  WBC 7.5  HGB 10.8*  HCT 33.1*  PLT 344   Recent Labs    09/06/22 0549  NA 139  K 3.5  CL 105  CO2 27  GLUCOSE 127*  BUN 6*  CREATININE 1.01*  CALCIUM 8.4*    Intake/Output Summary (Last 24 hours) at 09/06/2022 1120 Last data filed at 09/05/2022 1757 Gross per 24 hour  Intake 720 ml  Output --  Net 720 ml        Physical Exam: Vital Signs Blood pressure 124/60, pulse 82, temperature 98.3 F (36.8 C), temperature source Oral, resp. rate 18, height 5' 0.98" (1.549 m), weight 99.1 kg, SpO2 96%. Gen: no distress, normal appearing Gen: no distress, normal appearing HEENT: oral mucosa pink and moist, NCAT Cardio: Reg rate Chest: normal effort, normal rate of breathing Abd: soft, non-distended Ext: no edema Psych: pleasant, normal affect Musculoskeletal:     Cervical back: Normal range of motion.     Comments: Right shoulder with limited IR/ER d/t pain. Right knee with TKA scar and limitations in flexion.   Skin:    General: Skin is warm and dry.  Neurological:     Mental Status: She is alert.     Comments: Alert and oriented x 3. Normal insight and awareness. Intact Memory. Normal language and speech. Cranial nerve exam unremarkable. MMT: RUE limited by shoulder pain but otherwise 4/5. LUE grossly 5/5. RLE 4/5 prox to distal. LLE 4+/5. No focal sensory abnl. No abnl tone.    Psychiatric:        Mood and Affect: Mood normal.        Behavior: Behavior normal.      Assessment/Plan: 1. Functional deficits which require 3+ hours per day of interdisciplinary therapy in a comprehensive inpatient rehab  setting. Physiatrist is providing close team supervision and 24 hour management of active medical problems listed below. Physiatrist and rehab team continue to assess barriers to discharge/monitor patient progress toward functional and medical goals  Care Tool:  Bathing    Body parts bathed by patient: Right arm, Left arm, Chest, Abdomen, Front perineal area, Buttocks, Right upper leg, Left upper leg, Face   Body parts bathed by helper: Right lower leg, Left lower leg     Bathing assist Assist Level: Minimal Assistance - Patient > 75%     Upper Body Dressing/Undressing Upper body dressing   What is the patient wearing?: Pull over shirt    Upper body assist Assist Level: Supervision/Verbal cueing    Lower Body Dressing/Undressing Lower body dressing      What is the patient wearing?: Incontinence brief, Pants     Lower body assist Assist for lower body dressing: Moderate Assistance - Patient 50 - 74%     Toileting Toileting    Toileting assist Assist for toileting: Supervision/Verbal cueing     Transfers Chair/bed transfer  Transfers  assist     Chair/bed transfer assist level: Supervision/Verbal cueing     Locomotion Ambulation   Ambulation assist      Assist level: Supervision/Verbal cueing Assistive device: Other (comment) (upright platform walker) Max distance: 324ft   Walk 10 feet activity   Assist     Assist level: Supervision/Verbal cueing Assistive device: Other (comment) (upright platform walker)   Walk 50 feet activity   Assist Walk 50 feet with 2 turns activity did not occur: Safety/medical concerns (fatigue)  Assist level: Supervision/Verbal cueing Assistive device: Other (comment) (upright platform walker)    Walk 150 feet activity   Assist Walk 150 feet activity did not occur: Safety/medical concerns (fatigue)  Assist level: Supervision/Verbal cueing Assistive device: Other (comment) (upright platform walker)    Walk 10  feet on uneven surface  activity   Assist     Assist level: Supervision/Verbal cueing Assistive device: Other (comment) (upright platform walker)   Wheelchair     Assist Is the patient using a wheelchair?: Yes Type of Wheelchair: Manual    Wheelchair assist level: Supervision/Verbal cueing Max wheelchair distance: 160ft    Wheelchair 50 feet with 2 turns activity    Assist        Assist Level: Supervision/Verbal cueing   Wheelchair 150 feet activity     Assist      Assist Level: Minimal Assistance - Patient > 75%   Blood pressure 124/60, pulse 82, temperature 98.3 F (36.8 C), temperature source Oral, resp. rate 18, height 5' 0.98" (1.549 m), weight 99.1 kg, SpO2 96%.  Medical Problem List and Plan: 1. Functional deficits secondary to debility and encephalopathy after SIRS/bacterial sinusitis/sepsis             -patient may shower             -ELOS/Goals: 7-10 days, supervision goals with PT and OT  Grounds pass ordered 2.  Antithrombotics: -DVT/anticoagulation:  Pharmaceutical: Lovenox             -antiplatelet therapy: ASA 3.  OA bilateral knees,(hx of right TKA), pain Management: Tylenol as needed.  For therapy as needed. 4. Mood/Behavior/Sleep: LCSW to call for evaluation and support.             -- Melatonin as needed for insomnia             -antipsychotic agents:  N/A 5. Neuropsych/cognition: This patient is capable of making decisions on her own behalf. 6. Skin/Wound Care: Routine pressure-relief measures. 7. Fluids/Electrolytes/Nutrition: Monitor I's/O.  Check c-Met. 8. SIRS w/ encephalopathy, ?sinusitis: Has resolved.  -- Continue with 3 additional days of Augmentin per [facility] hospitalist for coverage of sinuses.  9. HTN: Monitor blood pressures TID--continue to hold losartan, HCTZ, and metoprolol.   Vitals:   09/05/22 1952 09/06/22 0351  BP: (!) 134/52 124/60  Pulse: 84 82  Resp: 18 18  Temp: 98.4 F (36.9 C) 98.3 F (36.8 C)  SpO2:  96% 96%  Mild tachy last noc, HR nl on exam today , BP a little soft would hold off on metoprolol for now              -- Monitor BP with increase in activity.  Resume home meds as indicated. 10.  T2DM: Monitor BS ac/hs. CM diet.  Continue to hold metformin and Ozempic.             --Was started on metformin a few weeks PTA which was causing diarrha--question cause of lactic acidosis.   -  d/c HS ISS, decreased CBGs to BID AC CBG (last 3)  Recent Labs    09/05/22 0613 09/05/22 1613 09/06/22 0543  GLUCAP 119* 126* 127*  Controlled 8/18  11.  Morbid obesity: BMI 41.  Educated on importance of weight loss as well as activity to help promote overall health and mobility. 12.  CAD: On ASA and Crestor.  Continue to hold Cozaar and metoprolol for now.             -- Monitor for any symptoms with increase in activity. 13. GERD: Pepcid ordered 10mg  daily. Was on Zegerid PTA  14. Nasal drainage: continue claritin at night  15. Headaches in supine position: reviewed CT and is negative for sinusitis  16. Suboptimal vitamin D: start ergocalciferol 50,000U once per week for 7 weeks, continue  17. Suboptimal potassium: give klor klor 1x on 8/14  18. MASD: ordered desitin, improved, continue  19. Pain of tongue: ordered lidocaine swish and swallow, continue    LOS: 6 days A FACE TO FACE EVALUATION WAS PERFORMED  Shermika Balthaser P Jowana Thumma 09/06/2022, 11:20 AM

## 2022-09-06 NOTE — Plan of Care (Signed)
  Problem: RH Wheelchair Mobility Goal: LTG Patient will propel w/c in controlled environment (PT) Description: LTG: Patient will propel wheelchair in controlled environment, # of feet with assist (PT) Outcome: Not Applicable Flowsheets (Taken 09/06/2022 0702) LTG: Pt will propel w/c in controlled environ  assist needed:: (D/C) -- Note: D/C Goal: LTG Patient will propel w/c in home environment (PT) Description: LTG: Patient will propel wheelchair in home environment, # of feet with assistance (PT). Outcome: Not Applicable Flowsheets (Taken 09/06/2022 0702) LTG: Pt will propel w/c in home environ  assist needed:: (D/C) -- Note: D/C

## 2022-09-06 NOTE — Progress Notes (Signed)
Occupational Therapy Session Note  Patient Details  Name: Anna Gamble MRN: [id number] Date of Birth: Aug 25, 1935  Today's Date: 09/06/2022 OT Individual Time: 1110-1205 & 4098-1191 OT Individual Time Calculation (min): 55 min & 42 min   Short Term Goals: Week 1:  OT Short Term Goal 1 (Week 1): STG = LTG 2/2 ELOS  Skilled Therapeutic Interventions/Progress Updates:  Session 1 Skilled OT intervention completed with focus on ADL retraining and functional endurance within a shower context. Pt received seated in w/c, agreeable to session. No pain reported.  Pt agreeable to shower. Sit > stand with RW (per preference 2/2 environmental space) with supervision, and supervision ambulatory transfers during session. Ambulated to toilet, managed all steps with supervision for continent void. Doffed all clothing without assist, but cues needed to use modified strategy for doffing bra to remove straps of bra then twist to front for lack of RUE ROM. Ambulated to shower, with cues needed to position RW out of way prior to side stepping into shower via grab bar.   Able to bathe all parts with set up A including at the standing level. Did wash pt's back, but pt has long brush at home she uses at baseline. Utilized reacher for donning of new pull up with cues for technique, then supervision to donn in stance. Min A needed for donning of bra; discussed front clasp bra for ease. Ambulated to sink, donned deo and shirt with set up A. Threaded pants with supervision, CGA for stance without AD. Threaded socks on BLE via figure 4 position, however some difficulty stabilizing BLE, with CGA needed to stabilize. Discussed use of sock aid with pt reporting her husband has one at home, but prefers to do on her own. Shoes donned total A. Dried hair seated in w/c with set up A.  Noted dyspnea with exertion with intermittent rest breaks needed throughout. Cues needed to pace herself, and education provided about energy  conservation strategies during ADLs at home.  Reviewed follow up recommendation of OP therapies, with education provided about HH vs OP, and pt agreeable to trial OP. Pt also reported that family ordered a BSC already; CSW notified. Pt remained seated in w/c, with husband present and with all needs in reach at end of session.  Session 2 Skilled OT intervention completed with focus on pain management education, ROM/strengthening of RUE. Pt received seated in w/c, agreeable to session. Discomfort reported in Rt shoulder and Lt shin with positional changes; pt declined pain meds. OT offered rest breaks, repositioning and heat throughout for pain reduction.  Pt declined self-care needs. Supervision sit > stand without AD. Ambulated with supervision using upright rollator during session.  Ambulated > gym without LOB. Seated EOM, OT applied hot pack to Rt shoulder in prep for ROM but also for pain relief. Remained on for about 10 mins during AAROM; removed without skin irritation or redness. Pt completed the following at [facility] to promote ROM/strength needed for BADLs:  AAROM (with PVC pipe) x10 -BUE shoulder flexion -RUE shoulder external rotation -RUE shoulder abduction -internal rotation (hand on hip without PVC pipe) Strengthening -bicep flexion x12 each side with 4 lb dumbbell -chest press 2x6 each side with 3 lb on LUE, 2 lb on RUE (Cues needed to avoid posterior bias 2/2 core weakness)  Ambulated back to room. Pt remained seated in w/c, with husband present, and with all needs in reach at end of session.   Therapy Documentation Precautions:  Precautions Precautions: Fall Restrictions Weight Bearing  Restrictions: No    Therapy/Group: Individual Therapy  Melvyn Novas, MS, OTR/L  09/06/2022, 1:47 PM

## 2022-09-06 NOTE — Progress Notes (Signed)
Patient ID: Anna Gamble, female   DOB: 30-Apr-1935, 87 y.o.   MRN: [id number]  Bedside Commode ordered through [employer].

## 2022-09-06 NOTE — Progress Notes (Signed)
Patient ID: Anna Gamble, female   DOB: [birth year], 87 y.o.   MRN: [id number] Outpatient referral faxed to [facility]

## 2022-09-07 LAB — GLUCOSE, CAPILLARY: Glucose-Capillary: 129 mg/dL — ABNORMAL HIGH (ref 70–99)

## 2022-09-07 MED ORDER — HYDROCHLOROTHIAZIDE 12.5 MG PO TABS
6.2500 mg | ORAL_TABLET | Freq: Every day | ORAL | Status: DC
  Administered 2022-09-07 – 2022-09-08 (×2): 6.25 mg via ORAL
  Filled 2022-09-07 (×2): qty 1

## 2022-09-07 NOTE — Progress Notes (Signed)
PROGRESS NOTE   Subjective/Complaints: C/o lower extremity edema, hydrochlorothiazide restarted at 6.25mg  daily Discussed discharge tomorrow  ROS: +nasal drainage, headaches improved but still present in the mornings, +MASD, +lower extremity edema   Objective:   No results found. Recent Labs    09/06/22 0549  WBC 7.5  HGB 10.8*  HCT 33.1*  PLT 344   Recent Labs    09/06/22 0549  NA 139  K 3.5  CL 105  CO2 27  GLUCOSE 127*  BUN 6*  CREATININE 1.01*  CALCIUM 8.4*    Intake/Output Summary (Last 24 hours) at 09/07/2022 1102 Last data filed at 09/07/2022 0833 Gross per 24 hour  Intake 668 ml  Output --  Net 668 ml        Physical Exam: Vital Signs Blood pressure (!) 128/47, pulse 85, temperature 98 F (36.7 C), temperature source Oral, resp. rate 16, height 5' 0.98" (1.549 m), weight 99.1 kg, SpO2 95%. Gen: no distress, normal appearing, BMI 41.30 HEENT: oral mucosa pink and moist, NCAT Cardio: Reg rate Chest: normal effort, normal rate of breathing Abd: soft, non-distended Ext: no edema Psych: pleasant, normal affect Musculoskeletal:     Cervical back: Normal range of motion.     Comments: Right shoulder with limited IR/ER d/t pain. Right knee with TKA scar and limitations in flexion.   Skin:    General: Skin is warm and dry.  Neurological:     Mental Status: She is alert.     Comments: Alert and oriented x 3. Normal insight and awareness. Intact Memory. Normal language and speech. Cranial nerve exam unremarkable. MMT: RUE limited by shoulder pain but otherwise 4/5. LUE grossly 5/5. RLE 4/5 prox to distal. LLE 4+/5. No focal sensory abnl. No abnl tone.    Psychiatric:        Mood and Affect: Mood normal.        Behavior: Behavior normal.      Assessment/Plan: 1. Functional deficits which require 3+ hours per day of interdisciplinary therapy in a comprehensive inpatient rehab setting. Physiatrist  is providing close team supervision and 24 hour management of active medical problems listed below. Physiatrist and rehab team continue to assess barriers to discharge/monitor patient progress toward functional and medical goals  Care Tool:  Bathing    Body parts bathed by patient: Right arm, Left arm, Chest, Abdomen, Front perineal area, Buttocks, Right upper leg, Left upper leg, Face, Right lower leg, Left lower leg   Body parts bathed by helper: Right lower leg, Left lower leg     Bathing assist Assist Level: Independent with assistive device     Upper Body Dressing/Undressing Upper body dressing   What is the patient wearing?: Pull over shirt    Upper body assist Assist Level: Set up assist    Lower Body Dressing/Undressing Lower body dressing      What is the patient wearing?: Incontinence brief, Pants     Lower body assist Assist for lower body dressing: Independent with assitive device Assistive Device Comment: reacher   Toileting Toileting    Toileting assist Assist for toileting: Independent with assistive device     Transfers Chair/bed transfer  Transfers  assist     Chair/bed transfer assist level: Independent with assistive device Chair/bed transfer assistive device: Other (upright platform rollator)   Locomotion Ambulation   Ambulation assist      Assist level: Independent with assistive device Assistive device: Other (comment) (upright platform rollator) Max distance: 173ft   Walk 10 feet activity   Assist     Assist level: Independent with assistive device Assistive device: Other (comment) (upright platform rollator)   Walk 50 feet activity   Assist Walk 50 feet with 2 turns activity did not occur: Safety/medical concerns (fatigue)  Assist level: Independent with assistive device Assistive device: Other (comment) (upright platform rollator)    Walk 150 feet activity   Assist Walk 150 feet activity did not occur:  Safety/medical concerns (fatigue)  Assist level: Independent with assistive device Assistive device: Other (comment) (upright platform rollator)    Walk 10 feet on uneven surface  activity   Assist     Assist level: Supervision/Verbal cueing Assistive device: Other (comment) (upright platform rollator)   Wheelchair     Assist Is the patient using a wheelchair?: Yes Type of Wheelchair: Manual    Wheelchair assist level: Supervision/Verbal cueing Max wheelchair distance: 160ft    Wheelchair 50 feet with 2 turns activity    Assist        Assist Level: Supervision/Verbal cueing   Wheelchair 150 feet activity     Assist      Assist Level: Minimal Assistance - Patient > 75%   Blood pressure (!) 128/47, pulse 85, temperature 98 F (36.7 C), temperature source Oral, resp. rate 16, height 5' 0.98" (1.549 m), weight 99.1 kg, SpO2 95%.  Medical Problem List and Plan: 1. Functional deficits secondary to debility and encephalopathy after SIRS/bacterial sinusitis/sepsis             -patient may shower             -ELOS/Goals: 7-10 days, supervision goals with PT and OT  Grounds pass ordered 2.  Antithrombotics: -DVT/anticoagulation:  Pharmaceutical: Lovenox             -antiplatelet therapy: ASA 3.  OA bilateral knees,(hx of right TKA), pain Management: Tylenol as needed.  For therapy as needed. 4. Mood/Behavior/Sleep: LCSW to call for evaluation and support.             -- Melatonin as needed for insomnia             -antipsychotic agents:  N/A 5. Neuropsych/cognition: This patient is capable of making decisions on her own behalf. 6. Skin/Wound Care: Routine pressure-relief measures. 7. Fluids/Electrolytes/Nutrition: Monitor I's/O.  Check c-Met. 8. SIRS w/ encephalopathy, ?sinusitis: Has resolved.  -- Continue with 3 additional days of Augmentin per [facility] hospitalist for coverage of sinuses.  9. HTN: Monitor blood pressures TID--continue to hold losartan,  HCTZ, and metoprolol.   Vitals:   09/06/22 1943 09/07/22 0536  BP: (!) 137/59 (!) 128/47  Pulse: 82 85  Resp: 16 16  Temp: 98 F (36.7 C) 98 F (36.7 C)  SpO2: 97% 95%  Mild tachy last noc, HR nl on exam today , BP a little soft would hold off on metoprolol for now              -- Monitor BP with increase in activity.  Resume home meds as indicated. 10.  T2DM: Monitor BS ac/hs. CM diet.  Continue to hold metformin and Ozempic.             --  Was started on metformin a few weeks PTA which was causing diarrha--question cause of lactic acidosis.   -d/c HS ISS, decreased CBGs to BID AC CBG (last 3)  Recent Labs    09/06/22 0543 09/06/22 1654 09/07/22 0632  GLUCAP 127* 118* 129*  Controlled 8/18  11.  Morbid obesity: BMI 41.  Educated on importance of weight loss as well as activity to help promote overall health and mobility. 12.  CAD: On ASA and Crestor.  Continue to hold Cozaar and metoprolol for now.             -- Monitor for any symptoms with increase in activity. 13. GERD: Pepcid ordered 10mg  daily. Was on Zegerid PTA  14. Nasal drainage: continue claritin at night  15. Headaches in supine position: reviewed CT and is negative for sinusitis  16. Suboptimal vitamin D: start ergocalciferol 50,000U once per week for 7 weeks, continue  17. Suboptimal potassium: give klor klor 1x on 8/14  18. MASD: ordered desitin, improved, continue  19. Pain of tongue: ordered lidocaine swish and swallow, continue  20. Lower extremity edema: hydrochlorothiazide started at 6.25mg  daily    LOS: 7 days A FACE TO FACE EVALUATION WAS PERFORMED  Jymir Dunaj P Shantrell Placzek 09/07/2022, 11:02 AM

## 2022-09-07 NOTE — Progress Notes (Signed)
Occupational Therapy Session Note  Patient Details  Name: Anna Gamble MRN: [id number] Date of Birth: 12-02-1935  Today's Date: 09/07/2022 OT Individual Time: 1015-1110 OT Individual Time Calculation (min): 55 min    Short Term Goals: Week 1:  OT Short Term Goal 1 (Week 1): STG = LTG 2/2 ELOS  Skilled Therapeutic Interventions/Progress Updates:  Skilled OT intervention completed with focus on cardiovascular/BUE endurance, higher level cognitive strategies. Pt received seated in w/c, agreeable to session. No pain reported.  Pt declined self-care needs. Transported dependently 2/2 fatigue in w/c <> gym for time.  Pt completed the following intervals while seated at the SCIF[facility] to promote global/BUE endurance needed for independence with BADLs and functional transfers: -3 min, level 2, forwards -3 min, level 2, forwards -3 min, level 3, backwards -3 min, level 3, backwards Intermittent rest breaks needed for fatigue however pt tolerating gentle ROM of RUE well  Pt participated in the following activities seated to promote gentle ROM of RUE, and cognitive strategies needed for independence and safety with BADL management: -Trail Making A- 1:23 min, no errors -Trail Making B- 3:30 min, 3 errors -Motor speed dot test- 2.45 sec, 4 misses; completed standing but required min A for LOB with Lt knee as it "gave out"  Back in room, pt remained seated in w/c, with husband present, and with all needs in reach at end of session.   Therapy Documentation Precautions:  Precautions Precautions: Fall Restrictions Weight Bearing Restrictions: No    Therapy/Group: Individual Therapy  Melvyn Novas, MS, OTR/L  09/07/2022, 12:04 PM

## 2022-09-07 NOTE — Progress Notes (Signed)
Physical Therapy Session Note  Patient Details  Name: Anna Gamble MRN: [id number] Date of Birth: 06-24-1935  Today's Date: 09/07/2022 PT Individual Time: [phone] and [phone] PT Individual Time Calculation (min): 55 min and 42 min  Short Term Goals: Week 1:  PT Short Term Goal 1 (Week 1): STG=LTG due to LOS  Skilled Therapeutic Interventions/Progress Updates:   Treatment Session 1 Received pt sitting in [place] with husband present at bedside. Pt agreeable to PT treatment and reported mild L knee pain - muscle rub applied. Session with emphasis on functional mobility/transfers, toileting, generalized strengthening and endurance, dynamic standing balance/coordination, and gait training. Pt performed all transfers with upright platform rollator and mod I throughout session. Pt ambulated in/out of bathroom with upright platform rollator and mod I and able to perform all toileting tasks without assist. Pt sat in WC at sink and washed hands with set up assist.   Pt then ambulated 141ft x 2 trials with upright platform rollator and mod I to/from dayroom. Pt stood on Airex without UE support x 2 trials with min A and worked on dynamic standing balance playing connect four with 1 UE support and min A for balance x 2 trials - limited by increased pain in L knee. Pt requested to work on seated exercises and performed the following exercises with emphasis on UE strength: -bicep curls with 7lb dowel 2x15 -overhead chest press with 3lb dowel 2x10 -horizontal chest press with 90 degrees with 3lb dowel 2x10 -LAQ 2x10 bilaterally with 1.5lb ankle weight  MD arrived for morning rounds and pt required multiple rest/water breaks throughout session. Pt then returned to room and concluded session with pt sitting in [facility] with all needs within reach. Pt made mod I in room.   Treatment Session 2 Received pt sitting in [facility] with husband present at bedside. Pt agreeable to PT treatment and reported mild pain in L  knee but declined any pain medication. Session with emphasis on functional mobility/transfers, generalized strengthening and endurance, dynamic standing balance/coordination, and gait training. Pt performed all transfers with upright platform rollator and mod I throughout session. Pt ambulated 150ft x 2 trials with upright platform rollator and mod I to/from main therapy gym.  Ambulated to // bars and worked on forward tandem walking on Airex inside // bars with BUE support fading to 1UE support and CGA for balance. Pt then performed the following exercises with emphasis on LE strength/ROM: -alternating marches 2x15 bilaterally -mini squats 2x10 -heel raises 2x10  -hip abduction 2x10 bilaterally -seated hip abduction with red TB 2x20 Pt relied heavily on BUE support on rollator for support when standing and limited by increased discomfort in L knee. Pt also required frequent rest breaks throughout due to fatigue. Returned to room and concluded session with pt sitting in [facility] with all needs within reach. Provided pt with ice pack for L knee and applied muscle rub.   Therapy Documentation Precautions:  Precautions Precautions: Fall Restrictions Weight Bearing Restrictions: No  Therapy/Group: Individual Therapy [name] [name] PT, DPT 09/07/2022, 7:12 AM

## 2022-09-07 NOTE — Plan of Care (Signed)
  Problem: RH Balance Goal: LTG Patient will maintain dynamic standing with ADLs (OT) Description: LTG:  Patient will maintain dynamic standing balance with assist during activities of daily living (OT)  Outcome: Completed/Met   Problem: Sit to Stand Goal: LTG:  Patient will perform sit to stand in prep for activites of daily living with assistance level (OT) Description: LTG:  Patient will perform sit to stand in prep for activites of daily living with assistance level (OT) Outcome: Completed/Met   Problem: RH Bathing Goal: LTG Patient will bathe all body parts with assist levels (OT) Description: LTG: Patient will bathe all body parts with assist levels (OT) Outcome: Completed/Met   Problem: RH Dressing Goal: LTG Patient will perform lower body dressing w/assist (OT) Description: LTG: Patient will perform lower body dressing with assist, with/without cues in positioning using equipment (OT) Outcome: Completed/Met   Problem: RH Toileting Goal: LTG Patient will perform toileting task (3/3 steps) with assistance level (OT) Description: LTG: Patient will perform toileting task (3/3 steps) with assistance level (OT)  Outcome: Completed/Met   Problem: RH Toilet Transfers Goal: LTG Patient will perform toilet transfers w/assist (OT) Description: LTG: Patient will perform toilet transfers with assist, with/without cues using equipment (OT) Outcome: Completed/Met   Problem: RH Tub/Shower Transfers Goal: LTG Patient will perform tub/shower transfers w/assist (OT) Description: LTG: Patient will perform tub/shower transfers with assist, with/without cues using equipment (OT) Outcome: Completed/Met   Problem: RH Awareness Goal: LTG: Patient will demonstrate awareness during functional activites type of (OT) Description: LTG: Patient will demonstrate awareness during functional activites type of (OT) Outcome: Completed/Met

## 2022-09-07 NOTE — Progress Notes (Signed)
Physical Therapy Discharge Summary  Patient Details  Name: Anna Gamble MRN: [id number] Date of Birth: 01/23/1935  Date of Discharge from PT service:September 07, 2022  Patient has met 7 of 7 long term goals due to improved activity tolerance, improved balance, improved postural control, increased strength, increased range of motion, improved awareness, and improved coordination. Patient to discharge at an ambulatory level Modified Independent using upright platform rollator. Patient's care partner is independent to provide the necessary physical assistance at discharge.  All goals met  Recommendation:  Patient will benefit from ongoing skilled PT services in outpatient setting to continue to advance safe functional mobility, address ongoing impairments in transfers, generalized strengthening and endurance, dynamic standing balance/coordination, gait training, and to minimize fall risk.  Equipment: No equipment provided  Reasons for discharge: treatment goals met  Patient/family agrees with progress made and goals achieved: Yes  PT Discharge Precautions/Restrictions Precautions Precautions: Fall Restrictions Weight Bearing Restrictions: No Pain Interference Pain Interference Pain Effect on Sleep: 1. Rarely or not at all Pain Interference with Therapy Activities: 1. Rarely or not at all Pain Interference with Day-to-Day Activities: 1. Rarely or not at all Cognition Overall Cognitive Status: Within Functional Limits for tasks assessed Arousal/Alertness: Awake/alert Orientation Level: Oriented X4 Memory: Appears intact Awareness: Appears intact Problem Solving: Appears intact Safety/Judgment: Appears intact Sensation Sensation Light Touch: Impaired Detail Light Touch Impaired Details: Impaired RLE Hot/Cold: Not tested Proprioception: Appears Intact Stereognosis: Not tested Additional Comments: decreased sensation along RLE since R TKA Coordination Gross Motor  Movements are Fluid and Coordinated: Yes Fine Motor Movements are Fluid and Coordinated: Yes Coordination and Movement Description: global weakness/deconditioning but improved significantly since eval Finger Nose Finger Test: [facility] bilaterally Heel Shin Test: slightly slower on RLE but [facility] Motor  Motor Motor: Within Functional Limits  Mobility Bed Mobility Bed Mobility: Rolling Right;Rolling Left;Sit to Supine;Supine to Sit Rolling Right: Independent with assistive device Rolling Left: Independent with assistive device Supine to Sit: Independent with assistive device Sit to Supine: Independent with assistive device Transfers Transfers: Sit to Stand;Stand to Sit;Stand Pivot Transfers Sit to Stand: Independent with assistive device Stand to Sit: Independent with assistive device Stand Pivot Transfers: Independent with assistive device Transfer (Assistive device): Other (Comment) (upright platform rollator) Locomotion  Gait Ambulation: Yes Gait Assistance: Independent with assistive device Gait Distance (Feet): 150 Feet Assistive device: Other (Comment) (upright platform rollator) Gait Gait: Yes Gait Pattern: Impaired Gait Pattern: Step-through pattern;Decreased step length - right;Decreased step length - left;Decreased stride length;Poor foot clearance - right;Poor foot clearance - left;Trunk flexed;Lateral hip instability;Wide base of support Gait velocity: decreased Stairs / Additional Locomotion Stairs: Yes Stairs Assistance: Supervision/Verbal cueing Stair Management Technique: Two rails Number of Stairs: 8 Height of Stairs: 6 Ramp: Supervision/Verbal cueing (upright platform rollator) Wheelchair Mobility Wheelchair Mobility: Yes Wheelchair Assistance: Doctor, general practice: Both lower extermities;Both upper extremities Wheelchair Parts Management: Needs assistance Distance: 188ft  Trunk/Postural Assessment  Cervical Assessment Cervical  Assessment: Within Functional Limits Thoracic Assessment Thoracic Assessment: Exceptions to [facility] (thoracic rounding) Lumbar Assessment Lumbar Assessment: Exceptions to [facility] (posterior pelvic tilt) Postural Control Postural Control: Within Functional Limits  Balance Balance Balance Assessed: Yes Static Sitting Balance Static Sitting - Balance Support: Feet supported;No upper extremity supported Static Sitting - Level of Assistance: 7: Independent Dynamic Sitting Balance Dynamic Sitting - Balance Support: Feet supported;No upper extremity supported Dynamic Sitting - Level of Assistance: 6: Modified independent (Device/Increase time) Static Standing Balance Static Standing - Balance Support: Bilateral upper extremity supported;During functional activity (  upright platform rollator) Static Standing - Level of Assistance: 6: Modified independent (Device/Increase time) Dynamic Standing Balance Dynamic Standing - Balance Support: Bilateral upper extremity supported;During functional activity (upright platform rollator) Dynamic Standing - Level of Assistance: 6: Modified independent (Device/Increase time) Dynamic Standing - Comments: with transfers and gait Extremity Assessment  RLE Assessment RLE Assessment: Exceptions to [facility] General Strength Comments: tested in [facility] RLE Strength Right Hip Flexion: 4-/5 Right Hip ABduction: 4/5 Right Hip ADduction: 4/5 Right Knee Flexion: 4-/5 Right Knee Extension: 4-/5 Right Ankle Dorsiflexion: 4+/5 Right Ankle Plantar Flexion: 4+/5 LLE Assessment LLE Assessment: Exceptions to [facility] General Strength Comments: tested sitting in WC LLE Strength Left Hip Flexion: 4/5 Left Hip ABduction: 4/5 Left Hip ADduction: 4/5 Left Knee Flexion: 4/5 Left Knee Extension: 4-/5 Left Ankle Dorsiflexion: 4+/5 Left Ankle Plantar Flexion: 4+/5   [name] [name] PT, DPT 09/07/2022, 7:20 AM

## 2022-09-07 NOTE — Progress Notes (Incomplete)
Physical Therapy Session Note  Patient Details  Name: Anna Gamble MRN: [id number] Date of Birth: 10-Jul-1935  {CHL IP REHAB PT TIME CALCULATION:[id number]}  Short Term Goals: Week 1:  PT Short Term Goal 1 (Week 1): STG=LTG due to LOS  Skilled Therapeutic Interventions/Progress Updates:    ***  Therapy Documentation Precautions:  Precautions Precautions: Fall Restrictions Weight Bearing Restrictions: No   Pain:  ***    Therapy/Group: Individual Therapy  [name] , PT, DPT, NCS, CSRS 09/07/2022, 3:37 PM

## 2022-09-07 NOTE — Progress Notes (Signed)
Occupational Therapy Discharge Summary  Patient Details  Name: Anna Gamble MRN: [id number] Date of Birth: 01/11/1936  Date of Discharge from OT service:September 07, 2022   Patient has met 8 of 8 long term goals due to improved activity tolerance, improved balance, functional use of  RIGHT upper extremity, improved awareness, and improved coordination.  Patient to discharge at overall Modified Independent level.  Patient's care partner is independent to provide the necessary physical assistance at discharge.    All goals met  Recommendation:  Patient will benefit from ongoing skilled OT services in outpatient setting to continue to advance functional skills in the area of BADL, iADL, and improve RUE ROM/strength in a pain free zone .  Equipment: No equipment provided  Reasons for discharge: treatment goals met  Patient/family agrees with progress made and goals achieved: Yes  OT Discharge Precautions/Restrictions  Precautions Precautions: Fall Restrictions Weight Bearing Restrictions: No ADL ADL Eating: Independent Where Assessed-Eating: Wheelchair Grooming: Modified independent Where Assessed-Grooming: Sitting at sink Upper Body Bathing: Independent Where Assessed-Upper Body Bathing: Shower Lower Body Bathing: Modified independent Where Assessed-Lower Body Bathing: Shower Upper Body Dressing: Setup Where Assessed-Upper Body Dressing: Wheelchair Lower Body Dressing: Modified independent Where Assessed-Lower Body Dressing: Wheelchair Toileting: Modified independent Where Assessed-Toileting: Neurosurgeon Method: Proofreader: Raised toilet seat, Other (comment) (upright platform rollator) Tub/Shower Transfer: Not assessed Tub/Shower Transfer Method: Unable to assess Film/video editor: Close supervision Film/video editor Method: Designer, industrial/product: Administrator, Grab bars Vision Baseline Vision/History: 1 Wears glasses Patient Visual Report: No change from baseline Vision Assessment?: No apparent visual deficits Perception  Perception: Within Functional Limits Praxis Praxis: WFL Cognition Cognition Overall Cognitive Status: Within Functional Limits for tasks assessed Arousal/Alertness: Awake/alert Orientation Level: Person;Place;Situation Person: Oriented Place: Oriented Situation: Oriented Memory: Appears intact Awareness: Appears intact Problem Solving: Appears intact Safety/Judgment: Appears intact Brief Interview for Mental Status (BIMS) Repetition of Three Words (First Attempt): 3 Temporal Orientation: Year: Correct Temporal Orientation: Month: Accurate within 5 days Temporal Orientation: Day: Correct Recall: "Sock": Yes, no cue required Recall: "Blue": Yes, no cue required Recall: "Bed": Yes, no cue required BIMS Summary Score: 15 Sensation Sensation Light Touch: Impaired Detail Light Touch Impaired Details: Impaired RLE Hot/Cold: Not tested Proprioception: Appears Intact Stereognosis: Not tested Additional Comments: decreased sensation along RLE since R TKA Coordination Gross Motor Movements are Fluid and Coordinated: Yes Fine Motor Movements are Fluid and Coordinated: Yes Coordination and Movement Description: global weakness/deconditioning but improved significantly since eval Finger Nose Finger Test: [facility] bilaterally Heel Shin Test: slightly slower on RLE but [facility] Motor  Motor Motor: Within Functional Limits Mobility  Bed Mobility Bed Mobility: Rolling Right;Rolling Left;Sit to Supine;Supine to Sit Rolling Right: Independent with assistive device Rolling Left: Independent with assistive device Supine to Sit: Independent with assistive device Sit to Supine: Independent with assistive device Transfers Sit to Stand: Independent with assistive device Stand to Sit: Independent with assistive device   Trunk/Postural Assessment  Cervical Assessment Cervical Assessment: Within Functional Limits Thoracic Assessment Thoracic Assessment: Exceptions to [facility] (thoracic rounding) Lumbar Assessment Lumbar Assessment: Exceptions to [facility] (posterior pelvic tilt) Postural Control Postural Control: Within Functional Limits  Balance Balance Balance Assessed: Yes Static Sitting Balance Static Sitting - Balance Support: Feet supported;No upper extremity supported Static Sitting - Level of Assistance: 7: Independent Dynamic Sitting Balance Dynamic Sitting - Balance Support: Feet supported;No upper extremity supported Dynamic Sitting - Level of Assistance: 6: Modified independent (Device/Increase time)  Static Standing Balance Static Standing - Balance Support: Bilateral upper extremity supported;During functional activity (upright platform rollator) Static Standing - Level of Assistance: 6: Modified independent (Device/Increase time) Dynamic Standing Balance Dynamic Standing - Balance Support: Bilateral upper extremity supported;During functional activity (upright platform rollator) Dynamic Standing - Level of Assistance: 6: Modified independent (Device/Increase time) Dynamic Standing - Comments: with transfers and gait Extremity/Trunk Assessment RUE Assessment RUE Assessment: Within Functional Limits LUE Assessment LUE Assessment: Within Functional Limits   Imanie Darrow E Elliyah Liszewski, MS, OTR/L  09/07/2022, 4:13 PM

## 2022-09-08 ENCOUNTER — Other Ambulatory Visit (HOSPITAL_COMMUNITY): Payer: Self-pay

## 2022-09-08 MED ORDER — MELATONIN 5 MG PO TABS
5.0000 mg | ORAL_TABLET | Freq: Every evening | ORAL | 0 refills | Status: DC | PRN
  Filled 2022-09-08: qty 30, 30d supply, fill #0

## 2022-09-08 MED ORDER — LORATADINE 10 MG PO TABS
10.0000 mg | ORAL_TABLET | Freq: Every day | ORAL | 0 refills | Status: DC
Start: 2022-09-08 — End: 2023-05-24
  Filled 2022-09-08: qty 100, 100d supply, fill #0

## 2022-09-08 MED ORDER — MUSCLE RUB 10-15 % EX CREA
1.0000 | TOPICAL_CREAM | Freq: Three times a day (TID) | CUTANEOUS | 0 refills | Status: AC
  Filled 2022-09-08: qty 85, 14d supply, fill #0

## 2022-09-08 MED ORDER — ZINC OXIDE 40 % EX OINT
TOPICAL_OINTMENT | Freq: Two times a day (BID) | CUTANEOUS | 0 refills | Status: DC
  Filled 2022-09-08: qty 56.7, fill #0

## 2022-09-08 MED ORDER — FAMOTIDINE 10 MG PO TABS
10.0000 mg | ORAL_TABLET | Freq: Every day | ORAL | 0 refills | Status: AC
  Filled 2022-09-08: qty 30, 30d supply, fill #0

## 2022-09-08 MED ORDER — ACETAMINOPHEN 325 MG PO TABS: 325.0000 mg | ORAL_TABLET | ORAL | Status: AC | PRN

## 2022-09-08 MED ORDER — POTASSIUM CHLORIDE CRYS ER 10 MEQ PO TBCR
10.0000 meq | EXTENDED_RELEASE_TABLET | Freq: Every day | ORAL | 0 refills | Status: AC
  Filled 2022-09-08: qty 30, 30d supply, fill #0

## 2022-09-08 MED ORDER — VITAMIN D (ERGOCALCIFEROL) 1.25 MG (50000 UNIT) PO CAPS
50000.0000 [IU] | ORAL_CAPSULE | ORAL | 0 refills | Status: DC
  Filled 2022-09-08: qty 5, 35d supply, fill #0

## 2022-09-08 MED ORDER — SODIUM BICARBONATE 650 MG PO TABS
650.0000 mg | ORAL_TABLET | Freq: Every day | ORAL | 0 refills | Status: AC
  Filled 2022-09-08: qty 30, 30d supply, fill #0

## 2022-09-08 MED ORDER — HYDROCHLOROTHIAZIDE 12.5 MG PO TABS
6.2500 mg | ORAL_TABLET | Freq: Every day | ORAL | 0 refills | Status: DC
  Filled 2022-09-08: qty 15, 30d supply, fill #0

## 2022-09-08 NOTE — Progress Notes (Signed)
PROGRESS NOTE   Subjective/Complaints: No new complaints this morning Leg swelling improved today, discussed that I had started lowest dose hydrochlorothiazide yesterday  ROS: +nasal drainage, headaches improved but still present in the mornings, +MASD, +lower extremity edema- improved   Objective:   No results found. Recent Labs    09/06/22 0549  WBC 7.5  HGB 10.8*  HCT 33.1*  PLT 344   Recent Labs    09/06/22 0549  NA 139  K 3.5  CL 105  CO2 27  GLUCOSE 127*  BUN 6*  CREATININE 1.01*  CALCIUM 8.4*    Intake/Output Summary (Last 24 hours) at 09/08/2022 0935 Last data filed at 09/08/2022 0747 Gross per 24 hour  Intake 590 ml  Output --  Net 590 ml        Physical Exam: Vital Signs Blood pressure 122/60, pulse 87, temperature 98.8 F (37.1 C), temperature source Oral, resp. rate 15, height 5' 0.98" (1.549 m), weight 99.1 kg, SpO2 97%. Gen: no distress, normal appearing, BMI 41.30 Gen: no distress, normal appearing HEENT: oral mucosa pink and moist, NCAT Cardio: Reg rate Chest: normal effort, normal rate of breathing Abd: soft, non-distended Ext: no edema Psych: pleasant, normal affect Musculoskeletal:     Cervical back: Normal range of motion.     Comments: Right shoulder with limited IR/ER d/t pain. Right knee with TKA scar and limitations in flexion.   Skin:    General: Skin is warm and dry.  Neurological:     Mental Status: She is alert.     Comments: Alert and oriented x 3. Normal insight and awareness. Intact Memory. Normal language and speech. Cranial nerve exam unremarkable. MMT: RUE limited by shoulder pain but otherwise 4/5. LUE grossly 5/5. RLE 4/5 prox to distal. LLE 4+/5. No focal sensory abnl. No abnl tone.    Psychiatric:        Mood and Affect: Mood normal.        Behavior: Behavior normal.      Assessment/Plan: 1. Functional deficits which require 3+ hours per day of  interdisciplinary therapy in a comprehensive inpatient rehab setting. Physiatrist is providing close team supervision and 24 hour management of active medical problems listed below. Physiatrist and rehab team continue to assess barriers to discharge/monitor patient progress toward functional and medical goals  Care Tool:  Bathing    Body parts bathed by patient: Right arm, Left arm, Chest, Abdomen, Front perineal area, Buttocks, Right upper leg, Left upper leg, Face, Right lower leg, Left lower leg   Body parts bathed by helper: Right lower leg, Left lower leg     Bathing assist Assist Level: Independent with assistive device     Upper Body Dressing/Undressing Upper body dressing   What is the patient wearing?: Pull over shirt    Upper body assist Assist Level: Set up assist    Lower Body Dressing/Undressing Lower body dressing      What is the patient wearing?: Incontinence brief, Pants     Lower body assist Assist for lower body dressing: Independent with assitive device Assistive Device Comment: reacher   Toileting Toileting    Toileting assist Assist for toileting: Independent with  assistive device     Transfers Chair/bed transfer  Transfers assist     Chair/bed transfer assist level: Independent with assistive device Chair/bed transfer assistive device: Other (upright platform rollator)   Locomotion Ambulation   Ambulation assist      Assist level: Independent with assistive device Assistive device: Other (comment) (upright platform rollator) Max distance: 13ft   Walk 10 feet activity   Assist     Assist level: Independent with assistive device Assistive device: Other (comment) (upright platform rollator)   Walk 50 feet activity   Assist Walk 50 feet with 2 turns activity did not occur: Safety/medical concerns (fatigue)  Assist level: Independent with assistive device Assistive device: Other (comment) (upright platform rollator)     Walk 150 feet activity   Assist Walk 150 feet activity did not occur: Safety/medical concerns (fatigue)  Assist level: Independent with assistive device Assistive device: Other (comment) (upright platform rollator)    Walk 10 feet on uneven surface  activity   Assist     Assist level: Supervision/Verbal cueing Assistive device: Other (comment) (upright platform rollator)   Wheelchair     Assist Is the patient using a wheelchair?: Yes Type of Wheelchair: Manual    Wheelchair assist level: Supervision/Verbal cueing Max wheelchair distance: 165ft    Wheelchair 50 feet with 2 turns activity    Assist        Assist Level: Supervision/Verbal cueing   Wheelchair 150 feet activity     Assist      Assist Level: Minimal Assistance - Patient > 75%   Blood pressure 122/60, pulse 87, temperature 98.8 F (37.1 C), temperature source Oral, resp. rate 15, height 5' 0.98" (1.549 m), weight 99.1 kg, SpO2 97%.  Medical Problem List and Plan: 1. Functional deficits secondary to debility and encephalopathy after SIRS/bacterial sinusitis/sepsis             -patient may shower             -ELOS/Goals: 7-10 days, supervision goals with PT and OT  Grounds pass ordered 2.  Antithrombotics: -DVT/anticoagulation:  Pharmaceutical: Lovenox             -antiplatelet therapy: ASA 3.  OA bilateral knees,(hx of right TKA), pain Management: Tylenol as needed.  For therapy as needed. 4. Mood/Behavior/Sleep: LCSW to call for evaluation and support.             -- Melatonin as needed for insomnia             -antipsychotic agents:  N/A 5. Neuropsych/cognition: This patient is capable of making decisions on her own behalf. 6. Skin/Wound Care: Routine pressure-relief measures. 7. Fluids/Electrolytes/Nutrition: Monitor I's/O.  Check c-Met. 8. SIRS w/ encephalopathy, ?sinusitis: Has resolved.  -- Continue with 3 additional days of Augmentin per [facility] hospitalist for coverage of  sinuses.  9. HTN: Monitor blood pressures TID--continue to hold losartan, HCTZ, and metoprolol.   Vitals:   09/07/22 1937 09/08/22 0520  BP: (!) 150/47 122/60  Pulse: 82 87  Resp: 16 15  Temp: 98 F (36.7 C) 98.8 F (37.1 C)  SpO2: 95% 97%  Mild tachy last noc, HR nl on exam today , BP a little soft would hold off on metoprolol for now              -- Monitor BP with increase in activity.  Resume home meds as indicated. 10.  T2DM: Monitor BS ac/hs. CM diet.  Continue to hold metformin and Ozempic.             --  Was started on metformin a few weeks PTA which was causing diarrha--question cause of lactic acidosis.   -d/c HS ISS, decreased CBGs to BID AC CBG (last 3)  Recent Labs    09/06/22 0543 09/06/22 1654 09/07/22 0632  GLUCAP 127* 118* 129*  Discussed that she does not need diabetes medications  11.  Morbid obesity: BMI 41.  Educated on importance of weight loss as well as activity to help promote overall health and mobility. 12.  CAD: On ASA and Crestor.  Continue to hold Cozaar and metoprolol for now.             -- Monitor for any symptoms with increase in activity. 13. GERD: Pepcid ordered 10mg  daily. Was on Zegerid PTA  14. Nasal drainage: continue claritin at night  15. Headaches in supine position: reviewed CT and is negative for sinusitis  16. Suboptimal vitamin D: start ergocalciferol 50,000U once per week for 7 weeks, continue  17. Suboptimal potassium: give klor klor 1x on 8/14  18. MASD: ordered desitin, improved, continue  19. Pain of tongue: ordered lidocaine swish and swallow, continue  20. Lower extremity edema: hydrochlorothiazide started at 6.25mg  daily, continue   >30 minutes spent in discharge of patient including review of medications and follow-up appointments, physical examination, and in answering all patient's questions     LOS: 8 days A FACE TO FACE EVALUATION WAS PERFORMED  Anna Gamble 09/08/2022, 9:35 AM

## 2022-09-08 NOTE — Discharge Instructions (Addendum)
  Inpatient Rehab Discharge Instructions  [place] Discharge date and time: 09/08/22   Activities/Precautions/ Functional Status: Activity: no lifting, driving, or strenuous exercise till cleared by MD Diet: cardiac diet Wound Care:  Desitin to area pressure area.    Functional status:  ___ No restrictions     ___ Walk up steps independently ___ 24/7 supervision/assistance   ___ Walk up steps with assistance _X__ Intermittent supervision/assistance  ___ Bathe/dress independently ___ Walk with walker     ___ Bathe/dress with assistance _X__ Walk Independently    ___ Shower independently ___ Walk with assistance    _X__ Shower with supervision  ___ No alcohol     ___ Return to work/school ________   Special Instructions: Monitor blood sugars before meals and at bedtime Need to drink plenty of water to avoid getting dehydrated/renal failure.    Below is a copy of your blood sugars on diet alone.   Latest Reference Range & Units 08/30/22 02:55 08/31/22 06:30 09/01/22 07:13 09/02/22 09:55 09/06/22 05:49  Glucose 70 - 99 mg/dL 213 (H) 086 (H) 578 (H) 140 (H) 127 (H)  (H): Data is abnormally high   COMMUNITY REFERRALS UPON DISCHARGE:    Outpatient: PT  & OT             Agency:[facility] [address] [place] [address] [phone]              Appointment Date/Time:WILL CALL TO SET UP FOLLOW UP APPOINTMENTS  Medical Equipment/Items Ordered:PURCHASED ON OWN                                                 Agency/Supplier:NA     My questions have been answered and I understand these instructions. I will adhere to these goals and the provided educational materials after my discharge from the hospital.  Patient/Caregiver Signature _______________________________ Date __________  Clinician Signature _______________________________________ Date __________  Please bring this form and your medication list with you to all  your follow-up doctor's appointments.

## 2022-09-08 NOTE — Progress Notes (Signed)
Inpatient Rehabilitation Care Coordinator Discharge Note   Patient Details  Name: Anna Gamble MRN: [id number] Date of Birth: 11-Oct-1935   Discharge location: HOME WITH HUSBAND WHO CAN ASSIST  Length of Stay: 8 DAYS  Discharge activity level: MOD/I LEVEL  Home/community participation: ACTIVE  Patient response NW:GNFAOZ Literacy - How often do you need to have someone help you when you read instructions, pamphlets, or other written material from your doctor or pharmacy?: Rarely  Patient response HY:QMVHQI Isolation - How often do you feel lonely or isolated from those around you?: Never  Services provided included: MD, RD, PT, OT, RN, CM, Pharmacy, SW  Financial Services:  Financial Services Utilized: Medicare    Choices offered to/list presented to: PT AND HUSBAND  Follow-up services arranged:  Outpatient    Outpatient Servicies: [facility]-PT & OT WILL CALL TO SET UP FOLLOW UP APPOINTMENTS    PT ORDERED BSC ON OWN NO OTHER NEEDS  Patient response to transportation need: Is the patient able to respond to transportation needs?: Yes In the past 12 months, has lack of transportation kept you from medical appointments or from getting medications?: No In the past 12 months, has lack of transportation kept you from meetings, work, or from getting things needed for daily living?: No   Patient/Family verbalized understanding of follow-up arrangements:  Yes  Individual responsible for coordination of the follow-up plan: Anna Gamble-HUSBAND [phone]  Confirmed correct DME delivered: Lucy Chris 09/08/2022    Comments (or additional information):HUSBAND WAS IN FOR EDUCATION AND BOTH FEEL READY TO GO HOME  Summary of Stay    Date/Time Discharge Planning CSW  09/07/22 1318 Discharging home tomorrow with spouse. Spouse purchased a BSC. CJB  09/01/22 0835 new admission, assesment pending. CJB       Anna Gamble, Anna Gamble

## 2022-09-08 NOTE — Discharge Summary (Signed)
Physician Discharge Summary  Patient ID: Anna Gamble MRN: [id number] DOB/AGE: [birth year] 87 y.o.  Admit date: 08/31/2022 Discharge date: 09/08/2022  Discharge Diagnoses:  Principal Problem:   Debility Active Problems:   HLD (hyperlipidemia)   Morbid obesity (HCC)   Essential hypertension   GERD (gastroesophageal reflux disease)   Discharged Condition: stable  Significant Diagnostic Studies: N/A   Labs:  Basic Metabolic Panel:    Latest Ref Rng & Units 09/06/2022    5:49 AM 09/02/2022    9:55 AM 09/01/2022    7:13 AM  BMP  Glucose 70 - 99 mg/dL 782  956  213   BUN 8 - 23 mg/dL 6  5  5    Creatinine 0.44 - 1.00 mg/dL 0.86  5.78  4.69   Sodium 135 - 145 mmol/L 139  138  138   Potassium 3.5 - 5.1 mmol/L 3.5  3.5  3.5   Chloride 98 - 111 mmol/L 105  101  101   CO2 22 - 32 mmol/L 27  26  26    Calcium 8.9 - 10.3 mg/dL 8.4  8.7  8.5      CBC:    Latest Ref Rng & Units 09/06/2022    5:49 AM 09/02/2022    9:55 AM 09/01/2022    7:13 AM  CBC  WBC 4.0 - 10.5 K/uL 7.5  10.3  9.2   Hemoglobin 12.0 - 15.0 g/dL 62.9  52.8  41.3   Hematocrit 36.0 - 46.0 % 33.1  34.8  35.2   Platelets 150 - 400 K/uL 344  266  231      CBG: Recent Labs  Lab 09/05/22 0613 09/05/22 1613 09/06/22 0543 09/06/22 1654 09/07/22 0632  GLUCAP 119* 126* 127* 118* 129*     Brief HPI:   Anna Gamble is a 87 y.o. female with history of HTN, morbid obesity-BMI 41, aortic stenosis s/p TVAR, T2DM, severe OA bilateral knees who was admitted on 08/27/2022 with progressive weakness, fall, lethargy and chills.  She was found to have SIRS with encephalopathy and lactic acidosis.  She was treated with IV antibiotics and IV fluids with improvement.  Metformin was held and sliding scale insulin use for blood sugar management.  Maxillofacial CT done due to reports of facial pain with headaches and was negative for sinusitis.  Patient's blood pressures were noted to be soft therefore BP meds held.   Lethargy resolved however she was noted to be deconditioned and required min assist overall.  CIR was recommended due to functional decline.   Hospital Course: Anna Gamble was admitted to rehab 08/31/2022 for inpatient therapies to consist of PT and OT at least three hours five days a week. Past admission physiatrist, therapy team and rehab RN have worked together to provide customized collaborative inpatient rehab.  Her blood pressures were monitored on TID basis and have been relatively stable.  Check of B med revealed electrolytes and renal status to be within normal limits.  PPI was resumed due to history of GERD.  With increase in mobility she was noted to have lower extremity edema therefore HCTZ was resumed at 6.25 mg/day.  Aspercreme was added for local measures/help manage knee pain.  She was found to have suboptimal vitamin D levels and was started on ergocalciferol 50,000 units/week.  Follow-up CBC showed mild drop in H&H without signs of bleeding.  Her diabetes has been monitored with ac/hs CBG checks and SSI was use prn for tighter BS control.  She has  been educated on carb modified diet as well as importance of weight loss to help improve mobility and health.  Her blood sugars are currently controlled on diet alone.  She was advised to continue monitoring blood sugars 2-4 times a day and follow-up with PCP for input on hypoglycemic regimen.  During his stay in rehab she has made good gains and is currently at supervision level.  She will continue to receive outpatient PT and OT at [facility] after discharge.  She was discharged home on 08/22   Rehab course: During patient's stay in rehab weekly team conferences were held to monitor patient's progress, set goals and discuss barriers to discharge. At admission, patient required mod assist with basic ADL tasks and min assist with mobility. She  has had improvement in activity tolerance, balance, postural control as well as  ability to compensate for deficits.  She is able to complete ADL tasks at modified independent level.  She is modified independent for transfers and to ambulate 150 feet with upright rollator.  She requires supervision to climb 8 stairs.  Family education has been completed.   Discharge disposition: 01-Home or Self Care  Diet: Carb Modified.   Special Instructions: Recommend CBC in 1-2 weeks to monitor H/H. Need to drink 6-8 glasses of water daily.   Allergies as of 09/08/2022       Reactions   Lisinopril Cough   Statins Other (See Comments)   muscle aches with some statins, tolerates low doses of crestor   Semaglutide Other (See Comments)   Took rybelsus tab. Cause her to have abdominal pain.         Medication List     STOP taking these medications    amoxicillin 500 MG capsule Commonly known as: AMOXIL   amoxicillin-clavulanate 875-125 MG tablet Commonly known as: AUGMENTIN   insulin aspart 100 UNIT/ML injection Commonly known as: novoLOG   metoprolol succinate 50 MG 24 hr tablet Commonly known as: TOPROL-XL   rosuvastatin 5 MG tablet Commonly known as: Crestor       TAKE these medications    accu-chek soft touch lancets Use as instructed   acetaminophen 325 MG tablet Commonly known as: TYLENOL Take 1-2 tablets (325-650 mg total) by mouth every 4 (four) hours as needed for mild pain.   Analgesic Balm 10-15 % Crea Apply 1 Application topically 3 (three) times daily before meals. To bilateral knees   aspirin 81 MG tablet Take 81 mg by mouth daily.   FreeStyle Libre 3 Reader [employer] Use daily to check [employer] 3 Sensor Misc Place 1 sensor on the skin every 14 days. Use to check glucose continuously   Heartburn Relief 10 MG tablet Generic drug: famotidine Take 1 tablet (10 mg total) by mouth daily.   hydrochlorothiazide 12.5 MG tablet Commonly known as: HYDRODIURIL Take 1/2 tablet (6.25 mg total) by mouth daily.   liver oil-zinc oxide  40 % ointment Commonly known as: DESITIN Apply topically 2 (two) times daily. To mass   loratadine 10 MG tablet Commonly known as: CLARITIN Take 1 tablet (10 mg total) by mouth at bedtime.   melatonin 5 MG Tabs Take 1 tablet (5 mg total) by mouth at bedtime as needed.   potassium chloride 10 MEQ tablet Commonly known as: KLOR-CON M Take 1 tablet (10 mEq total) by mouth daily.   PRESERVISION AREDS 2+MULTI VIT PO Take 1 tablet by mouth daily.   multivitamin with minerals tablet Take 1  tablet by mouth daily.   sodium bicarbonate 650 MG tablet Take 1 tablet (650 mg total) by mouth daily.   SYSTANE OP Place 2 drops into both eyes in the morning, at noon, and at bedtime.   TART CHERRY ADVANCED PO Take 1 capsule by mouth daily.   Vitamin D (Ergocalciferol) 1.25 MG (50000 UNIT) Caps capsule Commonly known as: DRISDOL Take 1 capsule (50,000 Units total) by mouth every 7 (seven) days.        Follow-up Information     [name], Neta Mends, MD Follow up.   Specialties: Internal Medicine, Pediatrics Why: Call in 1-2 days for post hospital follow up Contact information: [address] [place] [address] [phone]         Horton Chin, MD. Call.   Specialty: Physical Medicine and Rehabilitation Why: As needed Contact information: [address] [place] [address] [phone]                 Signed: Jacquelynn Cree 09/09/2022, 6:52 PM

## 2022-09-08 NOTE — Progress Notes (Signed)
Inpatient Rehabilitation Discharge Medication Review by a Pharmacist  A complete drug regimen review was completed for this patient to identify any potential clinically significant medication issues.  High Risk Drug Classes Is patient taking? Indication by Medication  Antipsychotic No   Anticoagulant No   Antibiotic No   Opioid No   Antiplatelet Yes Aspirin - T2DM  Hypoglycemics/insulin No   Vasoactive Medication No Hydrochlorothiazide - HTN  Chemotherapy No   Other Yes Pepcid - GERD Potassium - vitamin replacement  Vitamin D - vitamin replacement      Type of Medication Issue Identified Description of Issue Recommendation(s)  Drug Interaction(s) (clinically significant)     Duplicate Therapy     Allergy     No Medication Administration End Date     Incorrect Dose     Additional Drug Therapy Needed     Significant med changes from prior encounter (inform family/care partners about these prior to discharge). Metformin PTA causing diarrhea and lactic acidosis  Metoprolol has been on hold during admission due to hypotension   Crestor has been on hold during admission, pt reported not taking PTA Adjust therapy to appropriate agent as needed to control blood glucose   Evaluate need for re-initiation post discharge   Evaluate need for re-initiation post discharge  Other       Clinically significant medication issues were identified that warrant physician communication and completion of prescribed/recommended actions by midnight of the next day:  No   Pharmacist comments:   Time spent performing this drug regimen review (minutes):  30 minutes   Alinda Money L Emmelina Mcloughlin 09/08/2022 10:10 AM

## 2022-09-10 ENCOUNTER — Other Ambulatory Visit: Payer: Self-pay | Admitting: Cardiology

## 2022-09-13 NOTE — Progress Notes (Signed)
Cardiology Clinic Note   Patient Name: Anna Gamble Date of Encounter: 09/16/2022  Primary Care Provider:  [name], MD Primary Cardiologist:  Olga Millers, MD  Patient Profile    Anna Gamble 87 year old female presents the clinic today for follow-up evaluation of her coronary artery disease and essential hypertension.  Past Medical History    Past Medical History:  Diagnosis Date   CAD (coronary artery disease) 03/19/2014   Colon polyp    Coronary artery disease    GERD (gastroesophageal reflux disease)    Hx of colonoscopy with polypectomy 07/14/2012   medoff    Hyperlipidemia    Hypertension    Impaired fasting glucose    Morbid obesity (HCC)    S/P TAVR (transcatheter aortic valve replacement)    Severe aortic stenosis    Past Surgical History:  Procedure Laterality Date   APPENDECTOMY     CHOLECYSTECTOMY     FOOT SURGERY     rt   KNEE SURGERY     rt   LEFT AND RIGHT HEART CATHETERIZATION WITH CORONARY ANGIOGRAM N/A 10/10/2013   Procedure: LEFT AND RIGHT HEART CATHETERIZATION WITH CORONARY ANGIOGRAM;  Surgeon: Kathleene Hazel, MD;  Location: [facility] CATH LAB;  Service: Cardiovascular;  Laterality: N/A;   RIGHT/LEFT HEART CATH AND CORONARY ANGIOGRAPHY N/A 04/06/2018   Procedure: RIGHT/LEFT HEART CATH AND CORONARY ANGIOGRAPHY;  Surgeon: Kathleene Hazel, MD;  Location: [facility] INVASIVE CV LAB;  Service: Cardiovascular;  Laterality: N/A;   TEE WITHOUT CARDIOVERSION N/A 06/06/2018   Procedure: TRANSESOPHAGEAL ECHOCARDIOGRAM (TEE);  Surgeon: Kathleene Hazel, MD;  Location: [facility] OR;  Service: Open Heart Surgery;  Laterality: N/A;   TONSILLECTOMY     TOTAL KNEE ARTHROPLASTY  02/01/2011   Procedure: TOTAL KNEE ARTHROPLASTY;  Surgeon: Nestor Lewandowsky;  Location: [facility] OR;  Service: Orthopedics;  Laterality: Right;  DEPUY/SIGMA   TRANSCATHETER AORTIC VALVE REPLACEMENT, TRANSFEMORAL N/A 06/06/2018   Procedure: TRANSCATHETER AORTIC VALVE REPLACEMENT,  TRANSFEMORAL;  Surgeon: Kathleene Hazel, MD;  Location: [facility] OR;  Service: Open Heart Surgery;  Laterality: N/A;    Allergies  Allergies  Allergen Reactions   Lisinopril Cough   Statins Other (See Comments)    muscle aches with some statins, tolerates low doses of crestor   Semaglutide Other (See Comments)    Took rybelsus tab. Cause her to have abdominal pain.     History of Present Illness    Anna Gamble has a PMH of HTN, CAD, GERD, severe aortic valve stenosis status post TAVR 5/20, type 2 diabetes, and hyperlipidemia.  She was placed on beta-blocker therapy secondary to PVCs.  She underwent cardiac catheterization which showed 40% proximal RCA and 20% ostial left main.  Carotid Dopplers 5/20 showed 1-39% bilateral stenosis.  Cardiac event monitor 6/20 showed sinus rhythm with occasional PACs PVCs, couplets and brief PAT.  Echocardiogram 12/21 showed normal LV function, mild left ventricular hypertrophy, G1 DD, mean gradient aortic valve post replacement 14 mmHg and no aortic insufficiency.  She was seen in follow-up by Dr. Jens Som on 06/26/2021.  During that time she did note some dyspnea with exertion which was unchanged.  She denied orthopnea PND.  She had minimal lower extremity swelling.  She denied exertional chest pain and syncope.  She was admitted 08/27/2022 with progressive weakness lethargy and chills.  She was found to have encephalopathy and lactic acidosis.  She was treated with IV antibiotics and IV fluids.  She had maxillofacial CT which ruled out  sinusitis.  She was noted to have low blood pressure and her blood pressure medications were held.  Her lethargy resolved and she was noted to be deconditioned and required minimal assistance.  Inpatient rehab was recommended due to her functional decline.  She presents to the clinic today for follow-up evaluation and state she was in rehab for 2 weeks after her hospitalization.  She was able to go home 3 days early.   She is now back to her baseline physical activity.  She does note some memory impairment.  She continues to take hydrochlorothiazide.  Her blood pressure today is 120/80.  We reviewed her last echocardiogram and TAVR.  I will repeat echocardiogram and repeat fasting lipids and LFTs today.  I asked her to maintain her p.o. hydration.  Will plan follow-up after echocardiogram..  Will continue to hold metoprolol at this time.  Reports low heart rates which she was previously evaluated.  Metoprolol was held during hospitalization.  Today she denies chest pain, shortness of breath, lower extremity edema, fatigue, palpitations, melena, hematuria, hemoptysis, presyncope, syncope, orthopnea, and PND.     Home Medications    Prior to Admission medications   Medication Sig Start Date End Date Taking? Authorizing Provider  acetaminophen (TYLENOL) 325 MG tablet Take 1-2 tablets (325-650 mg total) by mouth every 4 (four) hours as needed for mild pain. 09/08/22   LoveEvlyn Kanner, PA-C  aspirin 81 MG tablet Take 81 mg by mouth daily.    [provider]  Continuous Glucose Receiver (FREESTYLE LIBRE 3 READER) DEVI Use daily to check CBGs 09/01/22   Raulkar, Drema Pry, MD  Continuous Glucose Sensor (FREESTYLE LIBRE 3 SENSOR) MISC Place 1 sensor on the skin every 14 days. Use to check glucose continuously 09/01/22   Raulkar, Drema Pry, MD  famotidine (PEPCID) 10 MG tablet Take 1 tablet (10 mg total) by mouth daily. 09/09/22   Love, Evlyn Kanner, PA-C  hydrochlorothiazide (HYDRODIURIL) 12.5 MG tablet Take 1/2 tablet (6.25 mg total) by mouth daily. 09/09/22   Love, Evlyn Kanner, PA-C  Lancets (ACCU-CHEK SOFT TOUCH) lancets Use as instructed 11/22/18   [name], Neta Mends, MD  liver oil-zinc oxide (DESITIN) 40 % ointment Apply topically 2 (two) times daily. To mass 09/08/22   Love, Evlyn Kanner, PA-C  loratadine (CLARITIN) 10 MG tablet Take 1 tablet (10 mg total) by mouth at bedtime. 09/08/22   Love, Evlyn Kanner, PA-C  melatonin 5 MG  TABS Take 1 tablet (5 mg total) by mouth at bedtime as needed. 09/08/22   Love, Evlyn Kanner, PA-C  Menthol-Methyl Salicylate (MUSCLE RUB) 10-15 % CREA Apply 1 Application topically 3 (three) times daily before meals. To bilateral knees 09/08/22   Love, Evlyn Kanner, PA-C  Misc Natural Products (TART CHERRY ADVANCED PO) Take 1 capsule by mouth daily.     [provider]  Multiple Vitamins-Minerals (MULTIVITAMIN WITH MINERALS) tablet Take 1 tablet by mouth daily.    [provider]  Multiple Vitamins-Minerals (PRESERVISION AREDS 2+MULTI VIT PO) Take 1 tablet by mouth daily.    [provider]  Polyethyl Glycol-Propyl Glycol (SYSTANE OP) Place 2 drops into both eyes in the morning, at noon, and at bedtime.    [provider]  potassium chloride (KLOR-CON M) 10 MEQ tablet Take 1 tablet (10 mEq total) by mouth daily. 09/08/22   Love, Evlyn Kanner, PA-C  sodium bicarbonate 650 MG tablet Take 1 tablet (650 mg total) by mouth daily. 09/09/22   Jacquelynn Cree, PA-C  Vitamin D, Ergocalciferol, (DRISDOL) 1.25 MG (50000 UNIT) CAPS capsule Take 1 capsule (50,000 Units total) by mouth every 7 (seven) days. 09/08/22   Love, Evlyn Kanner, PA-C    Family History    Family History  Problem Relation Age of Onset   Coronary artery disease Other        female- 1st degree relative   Coronary artery disease Other        female- 1st degree relative   Hypertension Other    Hyperlipidemia Other    Heart attack Mother        age 20's   Heart attack Father        age 81's   Stroke Brother        died    Hearing loss Son    Sleep apnea Son    Aortic stenosis Sister    Stroke Brother    Heart attack Brother    Rheum arthritis Sister    Osteoarthritis Sister    Arthritis Sister    Heart Problems Brother    She indicated that her mother is deceased. She indicated that her father is deceased. She indicated that two of her three sisters are alive. She indicated that two of her four brothers are  alive. She indicated that her maternal grandmother is deceased. She indicated that her maternal grandfather is deceased. She indicated that her paternal grandmother is deceased. She indicated that her paternal grandfather is deceased. She indicated that all of her three sons are alive.  Social History    Social History   Socioeconomic History   Marital status: Married    Spouse name: Not on file   Number of children: 4   Years of education: Not on file   Highest education level: Not on file  Occupational History   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   Occupation: Charity fundraiser, peds, cardiology    Comment: retired  Tobacco Use   Smoking status: Never   Smokeless tobacco: Never  Vaping Use   Vaping status: Never Used  Substance and Sexual Activity   Alcohol use: No   Drug use: No   Sexual activity: Not Currently    Comment: quit 40 yrs ago  Other Topics Concern   Not on file  Social History Narrative   hh of 3 -4  Married   Grand son   At home    Invalid son paraplegia and husband    And grandson recently     No pets   Fluctuating hh membors she cooks and prepares for them          Social Determinants of Health   Financial Resource Strain: Low Risk  (08/13/2022)   Overall Financial Resource Strain (CARDIA)    Difficulty of Paying Living Expenses: Not hard at all  Food Insecurity: No Food Insecurity (08/31/2022)   Hunger Vital Sign    Worried About Running Out of Food in the Last Year: Never true    Ran Out of Food in the Last Year: Never true  Transportation Needs: No Transportation Needs (08/31/2022)   PRAPARE - Administrator, Civil Service (Medical): No    Lack of Transportation (Non-Medical): No  Physical Activity: Inactive (08/13/2022)   Exercise Vital Sign    Days of Exercise per Week: 0 days    Minutes of Exercise per Session: 0 min  Stress: No Stress Concern Present (08/13/2022)   [employer] of Occupational Health -  Occupational Stress Questionnaire    Feeling of Stress : Not at all  Social Connections: Socially Integrated (08/13/2022)   Social Connection and Isolation Panel [NHANES]    Frequency of Communication with Friends and Family: More than three times a week    Frequency of Social Gatherings with Friends and Family: More than three times a week    Attends Religious Services: More than 4 times per year    Active Member of [employer] or Organizations: Yes    Attends Engineer, structural: More than 4 times per year    Marital Status: Married  Catering manager Violence: Not At Risk (08/31/2022)   Humiliation, Afraid, Rape, and Kick questionnaire    Fear of Current or Ex-Partner: No    Emotionally Abused: No    Physically Abused: No    Sexually Abused: No     Review of Systems    General:  No chills, fever, night sweats or weight changes.  Cardiovascular:  No chest pain, dyspnea on exertion, edema, orthopnea, palpitations, paroxysmal nocturnal dyspnea. Dermatological: No rash, lesions/masses Respiratory: No cough, dyspnea Urologic: No hematuria, dysuria Abdominal:   No nausea, vomiting, diarrhea, bright red blood per rectum, melena, or hematemesis Neurologic:  No visual changes, wkns, changes in mental status. All other systems reviewed and are otherwise negative except as noted above.  Physical Exam    VS:  BP 120/80 (BP Location: Left Arm, Patient Position: Sitting, Cuff Size: Large)   Pulse 83   Ht 5\' 1"  (1.549 m)   Wt 211 lb (95.7 kg)   SpO2 95%   BMI 39.87 kg/m  , BMI Body mass index is 39.87 kg/m. GEN: Well nourished, well developed, in no acute distress. HEENT: normal. Neck: Supple, no JVD, carotid bruits, or masses. Cardiac: RRR, no murmurs, rubs, or gallops. No clubbing, cyanosis, edema.  Radials/DP/PT 2+ and equal bilaterally.  Respiratory:  Respirations regular and unlabored, clear to auscultation bilaterally. GI: Soft, nontender, nondistended, BS + x 4. MS: no  deformity or atrophy. Skin: warm and dry, no rash. Neuro:  Strength and sensation are intact. Psych: Normal affect.  Accessory Clinical Findings    Recent Labs: 09/01/2022: ALT 45; Magnesium 2.1 09/06/2022: BUN 6; Creatinine, Ser 1.01; Hemoglobin 10.8; Platelets 344; Potassium 3.5; Sodium 139   Recent Lipid Panel    Component Value Date/Time   CHOL 211 (H) 05/24/2022 0904   CHOL 171 03/18/2021 0908   TRIG 226.0 (H) 05/24/2022 0904   HDL 47.60 05/24/2022 0904   HDL 61 03/18/2021 0908   CHOLHDL 4 05/24/2022 0904   VLDL 45.2 (H) 05/24/2022 0904   LDLCALC 75 03/18/2021 0908   LDLDIRECT 146.0 05/24/2022 0904         ECG personally reviewed by me today-none today.  Echocardiogram 01/13/2021  IMPRESSIONS     1. Left ventricular ejection fraction, by estimation, is 60 to 65%. The  left ventricle has normal function. The left ventricle has no regional  wall motion abnormalities. There is mild left ventricular hypertrophy.  Left ventricular diastolic parameters  are consistent with Grade I diastolic dysfunction (impaired relaxation).  Elevated left ventricular end-diastolic pressure.   2. Right ventricular systolic function is normal. The right ventricular  size is normal. There is normal pulmonary artery systolic pressure.   3. The mitral valve is normal in structure. Trivial mitral valve  regurgitation. No evidence of mitral stenosis.   4. Prosthetic valve gradients unchanged since 05/2019. The aortic valve  has been repaired/replaced. Aortic valve regurgitation is  not visualized.  No aortic stenosis is present. Echo findings are consistent with normal  structure and function of the aortic   valve prosthesis. Aortic valve mean gradient measures 14.0 mmHg. Aortic  valve Vmax measures 2.51 m/s.   5. The inferior vena cava is normal in size with greater than 50%  respiratory variability, suggesting right atrial pressure of 3 mmHg.   FINDINGS   Left Ventricle: Left ventricular  ejection fraction, by estimation, is 60  to 65%. The left ventricle has normal function. The left ventricle has no  regional wall motion abnormalities. The left ventricular internal cavity  size was normal in size. There is   mild left ventricular hypertrophy. Left ventricular diastolic parameters  are consistent with Grade I diastolic dysfunction (impaired relaxation).  Elevated left ventricular end-diastolic pressure.   Right Ventricle: The right ventricular size is normal. No increase in  right ventricular wall thickness. Right ventricular systolic function is  normal. There is normal pulmonary artery systolic pressure. The tricuspid  regurgitant velocity is 2.62 m/s, and   with an assumed right atrial pressure of 3 mmHg, the estimated right  ventricular systolic pressure is 30.5 mmHg.   Left Atrium: Left atrial size was normal in size.   Right Atrium: Right atrial size was normal in size.   Pericardium: There is no evidence of pericardial effusion.   Mitral Valve: The mitral valve is normal in structure. Trivial mitral  valve regurgitation. No evidence of mitral valve stenosis.   Tricuspid Valve: The tricuspid valve is normal in structure. Tricuspid  valve regurgitation is trivial. No evidence of tricuspid stenosis.   Aortic Valve: Prosthetic valve gradients unchanged since 05/2019. The  aortic valve has been repaired/replaced. Aortic valve regurgitation is not  visualized. No aortic stenosis is present. Aortic valve mean gradient  measures 14.0 mmHg. Aortic valve peak  gradient measures 25.2 mmHg. There is a 23 mm stented (TAVR) valve present  in the aortic position. Echo findings are consistent with normal structure  and function of the aortic valve prosthesis.   Pulmonic Valve: The pulmonic valve was normal in structure. Pulmonic valve  regurgitation is not visualized. No evidence of pulmonic stenosis.   Aorta: The aortic root is normal in size and structure.   Venous:  The inferior vena cava is normal in size with greater than 50%  respiratory variability, suggesting right atrial pressure of 3 mmHg.   IAS/Shunts: No atrial level shunt detected by color flow Doppler.        Assessment & Plan   1.  Aortic stenosis s/p TAVR-returning to baseline physical activity posthospitalization.  No increased work of breathing.  12/22 echocardiogram showed normal functioning aortic valve. Continue current medical therapy Continue to increase physical activity as tolerated Repeat echocardiogram  Palpitations-heart rate today 83 bpm. Continue metoprolol Avoid triggers caffeine, chocolate, EtOH, dehydration etc.  Essential hypertension-BP today 120/80. Continue  hydrochlorothiazide Maintain blood pressure log Low-sodium diet  Hyperlipidemia-LDL 75 on 03/18/21. Continue aspirin, rosuvastatin High-fiber diet Repeat fasting lipids and LFTs  Disposition: Follow-up with Dr. Jens Som or me in 2-3 months.   Thomasene Ripple. Pam Vanalstine NP-C     09/16/2022, 8:45 AM  [facility] [address] Office [phone] Fax [phone]    I spent 13 minutes examining this patient, reviewing medications, and using patient centered shared decision making involving her cardiac care.  Prior to her visit I spent greater than 20 minutes reviewing her past medical history,  medications, and prior cardiac tests.

## 2022-09-16 ENCOUNTER — Ambulatory Visit: Payer: Medicare Other | Attending: General Practice | Admitting: General Practice

## 2022-09-16 ENCOUNTER — Encounter: Payer: Self-pay | Admitting: General Practice

## 2022-09-16 VITALS — BP 120/80 | HR 83 | Ht 61.0 in | Wt 211.0 lb

## 2022-09-16 DIAGNOSIS — Z952 Presence of prosthetic heart valve: Secondary | ICD-10-CM | POA: Diagnosis not present

## 2022-09-16 DIAGNOSIS — I10 Essential (primary) hypertension: Secondary | ICD-10-CM | POA: Insufficient documentation

## 2022-09-16 DIAGNOSIS — R002 Palpitations: Secondary | ICD-10-CM | POA: Diagnosis not present

## 2022-09-16 DIAGNOSIS — E7800 Pure hypercholesterolemia, unspecified: Secondary | ICD-10-CM | POA: Diagnosis not present

## 2022-09-16 NOTE — Patient Instructions (Signed)
Medication Instructions:  The current medical regimen is effective;  continue present plan and medications as directed. Please refer to the Current Medication list given to you today.  *If you need a refill on your cardiac medications before your next appointment, please call your pharmacy*  Lab Work: FASTING LIPID AND LFT TODAY If you have labs (blood work) drawn today and your tests are completely normal, you will receive your results only by: MyChart Message (if you have MyChart) OR A paper copy in the mail If you have any lab test that is abnormal or we need to change your treatment, we will call you to review the results.  Testing/Procedures: Your physician has requested that you have an echocardiogram. Echocardiography is a painless test that uses sound waves to create images of your heart. It provides your doctor with information about the size and shape of your heart and how well your heart's chambers and valves are working. This procedure takes approximately one hour. There are no restrictions for this procedure. Please do NOT wear cologne, perfume, aftershave, or lotions (deodorant is allowed). Please arrive 15 minutes prior to your appointment time.   Other Instructions INCREASE PHYSICAL ACTIVITY AS TOLERATED MAINTAIN YOUR HYDRATION  Follow-Up: At [facility], you and your health needs are our priority.  As part of our continuing mission to provide you with exceptional heart care, we have created designated Provider Care Teams.  These Care Teams include your primary Cardiologist (physician) and Advanced Practice Providers (APPs -  Physician Assistants and Nurse Practitioners) who all work together to provide you with the care you need, when you need it.  Your next appointment:   2-3 month(s)  Provider:   Olga Millers, MD  or Edd Fabian, FNP

## 2022-09-17 LAB — HEPATIC FUNCTION PANEL
ALT: 24 IU/L (ref 0–32)
AST: 27 IU/L (ref 0–40)
Albumin: 4.3 g/dL (ref 3.7–4.7)
Alkaline Phosphatase: 79 IU/L (ref 44–121)
Bilirubin Total: 0.5 mg/dL (ref 0.0–1.2)
Bilirubin, Direct: 0.2 mg/dL (ref 0.00–0.40)
Total Protein: 7.1 g/dL (ref 6.0–8.5)

## 2022-09-17 LAB — LIPID PANEL
Chol/HDL Ratio: 4.9 ratio — ABNORMAL HIGH (ref 0.0–4.4)
Cholesterol, Total: 254 mg/dL — ABNORMAL HIGH (ref 100–199)
HDL: 52 mg/dL (ref >39)
LDL Chol Calc (NIH): 160 mg/dL — ABNORMAL HIGH (ref 0–99)
Triglycerides: 228 mg/dL — ABNORMAL HIGH (ref 0–149)
VLDL Cholesterol Cal: 42 mg/dL — ABNORMAL HIGH (ref 5–40)

## 2022-09-23 ENCOUNTER — Encounter: Payer: Self-pay | Admitting: Internal Medicine

## 2022-09-23 ENCOUNTER — Ambulatory Visit (INDEPENDENT_AMBULATORY_CARE_PROVIDER_SITE_OTHER): Payer: Medicare Other | Admitting: Internal Medicine

## 2022-09-23 VITALS — BP 136/76 | HR 79 | Temp 97.7°F | Ht 61.0 in | Wt 212.4 lb

## 2022-09-23 DIAGNOSIS — Z952 Presence of prosthetic heart valve: Secondary | ICD-10-CM | POA: Diagnosis not present

## 2022-09-23 DIAGNOSIS — I10 Essential (primary) hypertension: Secondary | ICD-10-CM | POA: Diagnosis not present

## 2022-09-23 DIAGNOSIS — Z79899 Other long term (current) drug therapy: Secondary | ICD-10-CM

## 2022-09-23 DIAGNOSIS — Z09 Encounter for follow-up examination after completed treatment for conditions other than malignant neoplasm: Secondary | ICD-10-CM

## 2022-09-23 DIAGNOSIS — Z23 Encounter for immunization: Secondary | ICD-10-CM

## 2022-09-23 DIAGNOSIS — D649 Anemia, unspecified: Secondary | ICD-10-CM | POA: Diagnosis not present

## 2022-09-23 DIAGNOSIS — E1165 Type 2 diabetes mellitus with hyperglycemia: Secondary | ICD-10-CM

## 2022-09-23 DIAGNOSIS — R531 Weakness: Secondary | ICD-10-CM | POA: Diagnosis not present

## 2022-09-23 LAB — CBC WITH DIFFERENTIAL/PLATELET
Basophils Absolute: 0 10*3/uL (ref 0.0–0.1)
Basophils Relative: 0.7 % (ref 0.0–3.0)
Eosinophils Absolute: 0.1 10*3/uL (ref 0.0–0.7)
Eosinophils Relative: 2.5 % (ref 0.0–5.0)
HCT: 42 % (ref 36.0–46.0)
Hemoglobin: 13.9 g/dL (ref 12.0–15.0)
Lymphocytes Relative: 39.7 % (ref 12.0–46.0)
Lymphs Abs: 2.3 10*3/uL (ref 0.7–4.0)
MCHC: 33 g/dL (ref 30.0–36.0)
MCV: 97.1 fl (ref 78.0–100.0)
Monocytes Absolute: 0.6 10*3/uL (ref 0.1–1.0)
Monocytes Relative: 10.1 % (ref 3.0–12.0)
Neutro Abs: 2.8 10*3/uL (ref 1.4–7.7)
Neutrophils Relative %: 47 % (ref 43.0–77.0)
Platelets: 275 10*3/uL (ref 150.0–400.0)
RBC: 4.32 Mil/uL (ref 3.87–5.11)
RDW: 14.1 % (ref 11.5–15.5)
WBC: 5.9 10*3/uL (ref 4.0–10.5)

## 2022-09-23 LAB — BASIC METABOLIC PANEL
BUN: 11 mg/dL (ref 6–23)
CO2: 31 meq/L (ref 19–32)
Calcium: 9.8 mg/dL (ref 8.4–10.5)
Chloride: 100 meq/L (ref 96–112)
Creatinine, Ser: 0.82 mg/dL (ref 0.40–1.20)
GFR: 65.39 mL/min (ref >60.00)
Glucose, Bld: 131 mg/dL — ABNORMAL HIGH (ref 70–99)
Potassium: 4.2 meq/L (ref 3.5–5.1)
Sodium: 136 meq/L (ref 135–145)

## 2022-09-23 LAB — HEMOGLOBIN A1C: Hgb A1c MFr Bld: 6.7 % — ABNORMAL HIGH (ref 4.6–6.5)

## 2022-09-23 NOTE — Progress Notes (Signed)
Chief Complaint  Patient presents with   Hospitalization Follow-up    Pt reports she passed out and had reaction from metformin. Pt reports she feels weak.  Pt's spouse inform pt will have echo on 10/15/2022.     HPI: Anna Gamble 87 y.o. [facility]      for sirs like syndrome   . Here with spouse.  Off all meds for  was on insulin and then ddc   restarted hydrochlorothiazide because of  edema  did rehab and did wuite well at home .  Poss se or other from metformin?  Has [facility]   echo this month and go from there .  To have gu anemia  post hosp Eating proteins and fresh veges.      And has cut out sweets and  carbs  bd in 130 ranges now.  And thus no lows  Was given high dose vit d  and now finishing .  Currently declined OP pt rehab activity  now sure and says can do exercises at home. But has decrease in physical activity since hospitalization.     ROS: See pertinent positives and negatives per HPI. Wearing dark glassess cause dry eye and belpharaitris  irritated with bright lights   Past Medical History:  Diagnosis Date   CAD (coronary artery disease) 03/19/2014   Colon polyp    Coronary artery disease    GERD (gastroesophageal reflux disease)    Hx of colonoscopy with polypectomy 07/14/2012   medoff    Hyperlipidemia    Hypertension    Impaired fasting glucose    Morbid obesity (HCC)    S/P TAVR (transcatheter aortic valve replacement)    Severe aortic stenosis     Family History  Problem Relation Age of Onset   Coronary artery disease Other        female- 1st degree relative   Coronary artery disease Other        female- 1st degree relative   Hypertension Other    Hyperlipidemia Other    Heart attack Mother        age 26's   Heart attack Father        age 80's   Stroke Brother        died    Hearing loss Son    Sleep apnea Son    Aortic stenosis Sister    Stroke Brother    Heart attack Brother    Rheum arthritis Sister     Osteoarthritis Sister    Arthritis Sister    Heart Problems Brother     Social History   Socioeconomic History   Marital status: Married    Spouse name: Not on file   Number of children: 4   Years of education: Not on file   Highest education level: Not on file  Occupational History   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   Occupation: Charity fundraiser, peds, cardiology    Comment: retired  Tobacco Use   Smoking status: Never   Smokeless tobacco: Never  Vaping Use   Vaping status: Never Used  Substance and Sexual Activity   Alcohol use: No   Drug use: No   Sexual activity: Not Currently    Comment: quit 40 yrs ago  Other Topics Concern   Not on file  Social History Narrative   hh of 3 -4  Married   Grand son   At home  Invalid son paraplegia and husband    And grandson recently     No pets   Fluctuating hh membors she cooks and prepares for them          Social Determinants of Health   Financial Resource Strain: Low Risk  (08/13/2022)   Overall Financial Resource Strain (CARDIA)    Difficulty of Paying Living Expenses: Not hard at all  Food Insecurity: No Food Insecurity (08/31/2022)   Hunger Vital Sign    Worried About Running Out of Food in the Last Year: Never true    Ran Out of Food in the Last Year: Never true  Transportation Needs: No Transportation Needs (08/31/2022)   PRAPARE - Administrator, Civil Service (Medical): No    Lack of Transportation (Non-Medical): No  Physical Activity: Inactive (08/13/2022)   Exercise Vital Sign    Days of Exercise per Week: 0 days    Minutes of Exercise per Session: 0 min  Stress: No Stress Concern Present (08/13/2022)   [employer] of Occupational Health - Occupational Stress Questionnaire    Feeling of Stress : Not at all  Social Connections: Socially Integrated (08/13/2022)   Social Connection and Isolation Panel [NHANES]    Frequency of Communication with Friends and Family: More than  three times a week    Frequency of Social Gatherings with Friends and Family: More than three times a week    Attends Religious Services: More than 4 times per year    Active Member of [employer] or Organizations: Yes    Attends Engineer, structural: More than 4 times per year    Marital Status: Married    Outpatient Medications Prior to Visit  Medication Sig Dispense Refill   acetaminophen (TYLENOL) 325 MG tablet Take 1-2 tablets (325-650 mg total) by mouth every 4 (four) hours as needed for mild pain.     aspirin 81 MG tablet Take 81 mg by mouth daily.     hydrochlorothiazide (HYDRODIURIL) 12.5 MG tablet Take 1/2 tablet (6.25 mg total) by mouth daily. 15 tablet 0   Lancets (ACCU-CHEK SOFT TOUCH) lancets Use as instructed 100 each 12   liver oil-zinc oxide (DESITIN) 40 % ointment Apply topically 2 (two) times daily. To mass 56.7 g 0   loratadine (CLARITIN) 10 MG tablet Take 1 tablet (10 mg total) by mouth at bedtime. 100 tablet 0   melatonin 5 MG TABS Take 1 tablet (5 mg total) by mouth at bedtime as needed. 30 tablet 0   Menthol-Methyl Salicylate (MUSCLE RUB) 10-15 % CREA Apply 1 Application topically 3 (three) times daily before meals. To bilateral knees 85 g 0   Misc Natural Products (TART CHERRY ADVANCED PO) Take 1 capsule by mouth daily.      Multiple Vitamins-Minerals (MULTIVITAMIN WITH MINERALS) tablet Take 1 tablet by mouth daily.     Multiple Vitamins-Minerals (PRESERVISION AREDS 2+MULTI VIT PO) Take 1 tablet by mouth daily.     Polyethyl Glycol-Propyl Glycol (SYSTANE OP) Place 2 drops into both eyes in the morning, at noon, and at bedtime.     potassium chloride (KLOR-CON M) 10 MEQ tablet Take 1 tablet (10 mEq total) by mouth daily. 30 tablet 0   sodium bicarbonate 650 MG tablet Take 1 tablet (650 mg total) by mouth daily. 30 tablet 0   Vitamin D, Ergocalciferol, (DRISDOL) 1.25 MG (50000 UNIT) CAPS capsule Take 1 capsule (50,000 Units total) by mouth every 7 (seven) days. 5  capsule 0  Continuous Glucose Receiver (FREESTYLE LIBRE 3 READER) DEVI Use daily to check CBGs (Patient not taking: Reported on 09/23/2022) 1 each 0   Continuous Glucose Sensor (FREESTYLE LIBRE 3 SENSOR) MISC Place 1 sensor on the skin every 14 days. Use to check glucose continuously (Patient not taking: Reported on 09/23/2022) 2 each 3   famotidine (PEPCID) 10 MG tablet Take 1 tablet (10 mg total) by mouth daily. 30 tablet 0   metoprolol succinate (TOPROL-XL) 50 MG 24 hr tablet TAKE 1 TABLET BY MOUTH DAILY (Patient not taking: Reported on 09/16/2022) 30 tablet 0   No facility-administered medications prior to visit.     EXAM:  BP 136/76 (BP Location: Left Arm, Patient Position: Sitting, Cuff Size: Large)   Pulse 79   Temp 97.7 F (36.5 C) (Oral)   Ht 5\' 1"  (1.549 m)   Wt 212 lb 6.4 oz (96.3 kg)   SpO2 97%   BMI 40.13 kg/m   Body mass index is 40.13 kg/m. Wt Readings from Last 3 Encounters:  09/23/22 212 lb 6.4 oz (96.3 kg)  09/16/22 211 lb (95.7 kg)  08/31/22 218 lb 7.6 oz (99.1 kg)    GENERAL: vitals reviewed and listed above, alert, oriented, appears well hydrated and in no acute distress waring sunglasses  HEENT: atraumatic, conjunctiva  clear, no obvious abnormalities on inspection of external nose and ears  NECK: no obvious masses on inspection palpation  LUNGS: clear to auscultation bilaterally, no wheezes, rales or rhonchi, good air movement CV: HRRR, no clubbing cyanosi nl cap refill 1+ edema  MS: moves all extremities without noticeable focal  abnormality Skin no obv bruising petechia  PSYCH: pleasant and cooperative, no obvious depression or anxietynl speech and  conversation Lab Results  Component Value Date   WBC 5.9 09/23/2022   HGB 13.9 09/23/2022   HCT 42.0 09/23/2022   PLT 275.0 09/23/2022   GLUCOSE 131 (H) 09/23/2022   CHOL 254 (H) 09/16/2022   TRIG 228 (H) 09/16/2022   HDL 52 09/16/2022   LDLDIRECT 146.0 05/24/2022   LDLCALC 160 (H) 09/16/2022   ALT 24  09/16/2022   AST 27 09/16/2022   NA 136 09/23/2022   K 4.2 09/23/2022   CL 100 09/23/2022   CREATININE 0.82 09/23/2022   BUN 11 09/23/2022   CO2 31 09/23/2022   TSH 0.95 04/13/2021   INR 1.1 08/27/2022   HGBA1C 6.7 (H) 09/23/2022   MICROALBUR <0.7 05/24/2022   BP Readings from Last 3 Encounters:  09/23/22 136/76  09/16/22 120/80  09/08/22 122/60  Last vitamin D Lab Results  Component Value Date   VD25OH 37.01 09/01/2022     ASSESSMENT AND PLAN:  Discussed the following assessment and plan:  Hospital discharge follow-up  Feeling weak  Type 2 diabetes mellitus with hyperglycemia, without long-term current use of insulin (HCC) - Plan: CBC with Differential/Platelet, Basic metabolic panel, Hemoglobin A1c  Medication management - Plan: CBC with Differential/Platelet, Basic metabolic panel, Hemoglobin A1c  Essential hypertension - Plan: CBC with Differential/Platelet, Basic metabolic panel, Hemoglobin A1c  Anemia, unspecified type  Influenza vaccine needed - Plan: Flu Vaccine Trivalent High Dose (Fluad)  S/P TAVR (transcatheter aortic valve replacement)  Poss se of metformin with other conditions  Monitor bg and diet changes .   Appears that life style changes have been good to control Fu cards  Encouraged to  continue rehab and pt  if ok with cards   to get conditioning and  stamina back  OP PT?  Can  take  otc vit d after rx  med  Of note is statin intolerant?  Opine to cards help. Consider other meds when feeling better . -Patient advised to return or notify health care team  if  new concerns arise.  Patient Instructions  Good to se you today . Continue healthy eating Update lab .  Eventually advise get back with pt at [facility] to ge your energy and mobility  best.  3 mos check  here as indicated    Burna Mortimer K. Mael Delap M.D.

## 2022-09-23 NOTE — Patient Instructions (Signed)
Good to se you today . Continue healthy eating Update lab .  Eventually advise get back with pt at [facility] to ge your energy and mobility  best.  3 mos check  here as indicated

## 2022-09-27 ENCOUNTER — Encounter: Payer: Self-pay | Admitting: Internal Medicine

## 2022-09-27 NOTE — Progress Notes (Signed)
Anemia is better , blood sugar stable , kidney function stable  nl gfr. No change in plans

## 2022-09-29 ENCOUNTER — Telehealth: Payer: Self-pay | Admitting: Cardiology

## 2022-09-29 DIAGNOSIS — E7800 Pure hypercholesterolemia, unspecified: Secondary | ICD-10-CM

## 2022-09-29 NOTE — Telephone Encounter (Signed)
Pt informed of providers result & recommendations. Pt verbalized understanding. All questions, if any, were answered.  Future order entered.

## 2022-09-29 NOTE — Telephone Encounter (Signed)
Patient states she is returning call and is requesting return call.

## 2022-09-29 NOTE — Telephone Encounter (Signed)
Anna Gamble, please contact patient and let her know that I have reviewed her lab work and her cardiac history.  Please have her resume her statin therapy.  (Rosuvastatin 5 mg daily.) we will plan to repeat her fasting lipids and LFTs in 8 weeks.

## 2022-09-29 NOTE — Telephone Encounter (Signed)
Spoke with patient and she is aware of lab results. She states she is not taking her statins. When she was in the hospital she stated they advised her to stop taking them.

## 2022-10-05 ENCOUNTER — Other Ambulatory Visit: Payer: Self-pay | Admitting: Cardiology

## 2022-10-15 ENCOUNTER — Ambulatory Visit (HOSPITAL_COMMUNITY): Payer: Medicare Other | Attending: General Practice

## 2022-10-15 DIAGNOSIS — Z952 Presence of prosthetic heart valve: Secondary | ICD-10-CM | POA: Diagnosis not present

## 2022-10-15 LAB — ECHOCARDIOGRAM COMPLETE
AR max vel: 1.63 cm2
AV Area VTI: 1.49 cm2
AV Area mean vel: 1.55 cm2
AV Mean grad: 13 mm[Hg]
AV Peak grad: 21.3 mm[Hg]
Ao pk vel: 2.31 m/s
Area-P 1/2: 3.85 cm2
S' Lateral: 2.6 cm

## 2022-11-03 DIAGNOSIS — Z1231 Encounter for screening mammogram for malignant neoplasm of breast: Secondary | ICD-10-CM | POA: Diagnosis not present

## 2022-11-03 LAB — HM MAMMOGRAPHY

## 2022-11-14 NOTE — Progress Notes (Unsigned)
Cardiology Clinic Note   Patient Name: Anna Gamble Date of Encounter: 11/16/2022  Primary Care Provider:  [name], MD Primary Cardiologist:  Olga Millers, MD  Patient Profile    Anna Gamble 87 year old female presents the clinic today for follow-up evaluation of her coronary artery disease and essential hypertension.  Past Medical History    Past Medical History:  Diagnosis Date   CAD (coronary artery disease) 03/19/2014   Colon polyp    Coronary artery disease    GERD (gastroesophageal reflux disease)    Hx of colonoscopy with polypectomy 07/14/2012   medoff    Hyperlipidemia    Hypertension    Impaired fasting glucose    Morbid obesity (HCC)    S/P TAVR (transcatheter aortic valve replacement)    Severe aortic stenosis    Past Surgical History:  Procedure Laterality Date   APPENDECTOMY     CHOLECYSTECTOMY     FOOT SURGERY     rt   KNEE SURGERY     rt   LEFT AND RIGHT HEART CATHETERIZATION WITH CORONARY ANGIOGRAM N/A 10/10/2013   Procedure: LEFT AND RIGHT HEART CATHETERIZATION WITH CORONARY ANGIOGRAM;  Surgeon: Kathleene Hazel, MD;  Location: [facility] CATH LAB;  Service: Cardiovascular;  Laterality: N/A;   RIGHT/LEFT HEART CATH AND CORONARY ANGIOGRAPHY N/A 04/06/2018   Procedure: RIGHT/LEFT HEART CATH AND CORONARY ANGIOGRAPHY;  Surgeon: Kathleene Hazel, MD;  Location: [facility] INVASIVE CV LAB;  Service: Cardiovascular;  Laterality: N/A;   TEE WITHOUT CARDIOVERSION N/A 06/06/2018   Procedure: TRANSESOPHAGEAL ECHOCARDIOGRAM (TEE);  Surgeon: Kathleene Hazel, MD;  Location: [facility] OR;  Service: Open Heart Surgery;  Laterality: N/A;   TONSILLECTOMY     TOTAL KNEE ARTHROPLASTY  02/01/2011   Procedure: TOTAL KNEE ARTHROPLASTY;  Surgeon: Nestor Lewandowsky;  Location: [facility] OR;  Service: Orthopedics;  Laterality: Right;  DEPUY/SIGMA   TRANSCATHETER AORTIC VALVE REPLACEMENT, TRANSFEMORAL N/A 06/06/2018   Procedure: TRANSCATHETER AORTIC VALVE  REPLACEMENT, TRANSFEMORAL;  Surgeon: Kathleene Hazel, MD;  Location: [facility] OR;  Service: Open Heart Surgery;  Laterality: N/A;    Allergies  Allergies  Allergen Reactions   Lisinopril Cough   Statins Other (See Comments)    muscle aches with some statins, tolerates low doses of crestor   Semaglutide Other (See Comments)    Took rybelsus tab. Cause her to have abdominal pain.     History of Present Illness    Anna Gamble has a PMH of HTN, CAD, GERD, severe aortic valve stenosis status post TAVR 5/20, type 2 diabetes, and hyperlipidemia.  She was placed on beta-blocker therapy secondary to PVCs.  She underwent cardiac catheterization which showed 40% proximal RCA and 20% ostial left main.  Carotid Dopplers 5/20 showed 1-39% bilateral stenosis.  Cardiac event monitor 6/20 showed sinus rhythm with occasional PACs PVCs, couplets and brief PAT.  Echocardiogram 12/21 showed normal LV function, mild left ventricular hypertrophy, G1 DD, mean gradient aortic valve post replacement 14 mmHg and no aortic insufficiency.  She was seen in follow-up by Dr. Jens Som on 06/26/2021.  During that time she did note some dyspnea with exertion which was unchanged.  She denied orthopnea PND.  She had minimal lower extremity swelling.  She denied exertional chest pain and syncope.  She was admitted 08/27/2022 with progressive weakness lethargy and chills.  She was found to have encephalopathy and lactic acidosis.  She was treated with IV antibiotics and IV fluids.  She had maxillofacial CT which ruled out  sinusitis.  She was noted to have low blood pressure and her blood pressure medications were held.  Her lethargy resolved and she was noted to be deconditioned and required minimal assistance.  Inpatient rehab was recommended due to her functional decline.  She presented to the clinic 09/16/2022 for follow-up evaluation and stated she was in rehab for 2 weeks after her hospitalization.  She was able to go  home 3 days early.  She was back to her baseline physical activity.  She did note some memory impairment.  She continued to take hydrochlorothiazide.  Her blood pressure was 120/80.  We reviewed her last echocardiogram and TAVR.  I planned repeat echocardiogram and repeat fasting lipids and LFTs today.  I asked her to maintain her p.o. hydration.  I planned follow-up after echocardiogram..   metoprolol was held at that time.  Reported low heart rates which she was previously evaluated.  Metoprolol was held during hospitalization.  She presents to the clinic today for follow-up evaluation and states she has been occasionally taking her metoprolol because she has been hearing her heartbeat.  Her blood pressure today is 110/64 and as a pulse of 68.  I encouraged her not to take her metoprolol because it was previously discontinued.  She has occasionally noted epigastric type pain with laying down at night.  This was present for around 1 week and then dissipated without intervention.  She does not notice exertional type chest discomfort.  We reviewed her echocardiogram and she expressed understanding.  I will have her continue her physical activity and low-sodium diet.  We will continue her current medication regimen and plan follow-up in 6 to 9 months.  Today she denies chest pain, shortness of breath, lower extremity edema, fatigue, palpitations, melena, hematuria, hemoptysis, presyncope, syncope, orthopnea, and PND.     Home Medications    Prior to Admission medications   Medication Sig Start Date End Date Taking? Authorizing Provider  acetaminophen (TYLENOL) 325 MG tablet Take 1-2 tablets (325-650 mg total) by mouth every 4 (four) hours as needed for mild pain. 09/08/22   LoveEvlyn Kanner, PA-C  aspirin 81 MG tablet Take 81 mg by mouth daily.    [provider]  Continuous Glucose Receiver (FREESTYLE LIBRE 3 READER) DEVI Use daily to check CBGs 09/01/22   Raulkar, Drema Pry, MD  Continuous  Glucose Sensor (FREESTYLE LIBRE 3 SENSOR) MISC Place 1 sensor on the skin every 14 days. Use to check glucose continuously 09/01/22   Raulkar, Drema Pry, MD  famotidine (PEPCID) 10 MG tablet Take 1 tablet (10 mg total) by mouth daily. 09/09/22   Love, Evlyn Kanner, PA-C  hydrochlorothiazide (HYDRODIURIL) 12.5 MG tablet Take 1/2 tablet (6.25 mg total) by mouth daily. 09/09/22   Love, Evlyn Kanner, PA-C  Lancets (ACCU-CHEK SOFT TOUCH) lancets Use as instructed 11/22/18   [name], Neta Mends, MD  liver oil-zinc oxide (DESITIN) 40 % ointment Apply topically 2 (two) times daily. To mass 09/08/22   Love, Evlyn Kanner, PA-C  loratadine (CLARITIN) 10 MG tablet Take 1 tablet (10 mg total) by mouth at bedtime. 09/08/22   Love, Evlyn Kanner, PA-C  melatonin 5 MG TABS Take 1 tablet (5 mg total) by mouth at bedtime as needed. 09/08/22   Love, Evlyn Kanner, PA-C  Menthol-Methyl Salicylate (MUSCLE RUB) 10-15 % CREA Apply 1 Application topically 3 (three) times daily before meals. To bilateral knees 09/08/22   Love, Evlyn Kanner, PA-C  Misc Natural Products (TART CHERRY ADVANCED PO) Take 1  capsule by mouth daily.     [provider]  Multiple Vitamins-Minerals (MULTIVITAMIN WITH MINERALS) tablet Take 1 tablet by mouth daily.    [provider]  Multiple Vitamins-Minerals (PRESERVISION AREDS 2+MULTI VIT PO) Take 1 tablet by mouth daily.    [provider]  Polyethyl Glycol-Propyl Glycol (SYSTANE OP) Place 2 drops into both eyes in the morning, at noon, and at bedtime.    [provider]  potassium chloride (KLOR-CON M) 10 MEQ tablet Take 1 tablet (10 mEq total) by mouth daily. 09/08/22   Love, Evlyn Kanner, PA-C  sodium bicarbonate 650 MG tablet Take 1 tablet (650 mg total) by mouth daily. 09/09/22   Love, Evlyn Kanner, PA-C  Vitamin D, Ergocalciferol, (DRISDOL) 1.25 MG (50000 UNIT) CAPS capsule Take 1 capsule (50,000 Units total) by mouth every 7 (seven) days. 09/08/22   Love, Evlyn Kanner, PA-C    Family History    Family  History  Problem Relation Age of Onset   Coronary artery disease Other        female- 1st degree relative   Coronary artery disease Other        female- 1st degree relative   Hypertension Other    Hyperlipidemia Other    Heart attack Mother        age 86's   Heart attack Father        age 3's   Stroke Brother        died    Hearing loss Son    Sleep apnea Son    Aortic stenosis Sister    Stroke Brother    Heart attack Brother    Rheum arthritis Sister    Osteoarthritis Sister    Arthritis Sister    Heart Problems Brother    She indicated that her mother is deceased. She indicated that her father is deceased. She indicated that two of her three sisters are alive. She indicated that two of her four brothers are alive. She indicated that her maternal grandmother is deceased. She indicated that her maternal grandfather is deceased. She indicated that her paternal grandmother is deceased. She indicated that her paternal grandfather is deceased. She indicated that all of her three sons are alive.  Social History    Social History   Socioeconomic History   Marital status: Married    Spouse name: Not on file   Number of children: 4   Years of education: Not on file   Highest education level: Not on file  Occupational History   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   Occupation: Charity fundraiser, peds, cardiology    Comment: retired  Tobacco Use   Smoking status: Never   Smokeless tobacco: Never  Vaping Use   Vaping status: Never Used  Substance and Sexual Activity   Alcohol use: No   Drug use: No   Sexual activity: Not Currently    Comment: quit 40 yrs ago  Other Topics Concern   Not on file  Social History Narrative   hh of 3 -4  Married   Grand son   At home    Invalid son paraplegia and husband    And grandson recently     No pets   Fluctuating hh membors she cooks and prepares for them          Social Determinants of Health   Financial Resource  Strain: Low Risk  (08/13/2022)   Overall Financial Resource Strain (CARDIA)  Difficulty of Paying Living Expenses: Not hard at all  Food Insecurity: No Food Insecurity (08/31/2022)   Hunger Vital Sign    Worried About Running Out of Food in the Last Year: Never true    Ran Out of Food in the Last Year: Never true  Transportation Needs: No Transportation Needs (08/31/2022)   PRAPARE - Administrator, Civil Service (Medical): No    Lack of Transportation (Non-Medical): No  Physical Activity: Inactive (08/13/2022)   Exercise Vital Sign    Days of Exercise per Week: 0 days    Minutes of Exercise per Session: 0 min  Stress: No Stress Concern Present (08/13/2022)   [employer] of Occupational Health - Occupational Stress Questionnaire    Feeling of Stress : Not at all  Social Connections: Socially Integrated (08/13/2022)   Social Connection and Isolation Panel [NHANES]    Frequency of Communication with Friends and Family: More than three times a week    Frequency of Social Gatherings with Friends and Family: More than three times a week    Attends Religious Services: More than 4 times per year    Active Member of [employer] or Organizations: Yes    Attends Engineer, structural: More than 4 times per year    Marital Status: Married  Catering manager Violence: Not At Risk (08/31/2022)   Humiliation, Afraid, Rape, and Kick questionnaire    Fear of Current or Ex-Partner: No    Emotionally Abused: No    Physically Abused: No    Sexually Abused: No     Review of Systems    General:  No chills, fever, night sweats or weight changes.  Cardiovascular:  No chest pain, dyspnea on exertion, edema, orthopnea, palpitations, paroxysmal nocturnal dyspnea. Dermatological: No rash, lesions/masses Respiratory: No cough, dyspnea Urologic: No hematuria, dysuria Abdominal:   No nausea, vomiting, diarrhea, bright red blood per rectum, melena, or hematemesis Neurologic:  No visual  changes, wkns, changes in mental status. All other systems reviewed and are otherwise negative except as noted above.  Physical Exam    VS:  BP 110/64 (BP Location: Left Arm, Patient Position: Sitting, Cuff Size: Large)   Pulse 68   Ht 5\' 1"  (1.549 m)   Wt 213 lb 6.4 oz (96.8 kg)   SpO2 96%   BMI 40.32 kg/m  , BMI Body mass index is 40.32 kg/m. GEN: Well nourished, well developed, in no acute distress. HEENT: normal. Neck: Supple, no JVD, carotid bruits, or masses. Cardiac: RRR, no murmurs, rubs, or gallops. No clubbing, cyanosis, edema.  Radials/DP/PT 2+ and equal bilaterally.  Respiratory:  Respirations regular and unlabored, clear to auscultation bilaterally. GI: Soft, nontender, nondistended, BS + x 4. MS: no deformity or atrophy. Skin: warm and dry, no rash. Neuro:  Strength and sensation are intact. Psych: Normal affect.  Accessory Clinical Findings    Recent Labs: 09/01/2022: Magnesium 2.1 09/16/2022: ALT 24 09/23/2022: BUN 11; Creatinine, Ser 0.82; Hemoglobin 13.9; Platelets 275.0; Potassium 4.2; Sodium 136   Recent Lipid Panel    Component Value Date/Time   CHOL 254 (H) 09/16/2022 0901   TRIG 228 (H) 09/16/2022 0901   HDL 52 09/16/2022 0901   CHOLHDL 4.9 (H) 09/16/2022 0901   CHOLHDL 4 05/24/2022 0904   VLDL 45.2 (H) 05/24/2022 0904   LDLCALC 160 (H) 09/16/2022 0901   LDLDIRECT 146.0 05/24/2022 0904         ECG personally reviewed by me today-EKG Interpretation Date/Time:  Tuesday November 16 2022  11:28:48 EDT Ventricular Rate:  68 PR Interval:  148 QRS Duration:  90 QT Interval:  408 QTC Calculation: 433 R Axis:   14  Text Interpretation: Normal sinus rhythm Normal ECG When compared with ECG of 27-Aug-2022 06:46, PREVIOUS ECG IS PRESENT Confirmed by Edd Fabian [phone]) on 11/16/2022 11:41:14 AM   Echocardiogram 01/13/2021  IMPRESSIONS     1. Left ventricular ejection fraction, by estimation, is 60 to 65%. The  left ventricle has normal  function. The left ventricle has no regional  wall motion abnormalities. There is mild left ventricular hypertrophy.  Left ventricular diastolic parameters  are consistent with Grade I diastolic dysfunction (impaired relaxation).  Elevated left ventricular end-diastolic pressure.   2. Right ventricular systolic function is normal. The right ventricular  size is normal. There is normal pulmonary artery systolic pressure.   3. The mitral valve is normal in structure. Trivial mitral valve  regurgitation. No evidence of mitral stenosis.   4. Prosthetic valve gradients unchanged since 05/2019. The aortic valve  has been repaired/replaced. Aortic valve regurgitation is not visualized.  No aortic stenosis is present. Echo findings are consistent with normal  structure and function of the aortic   valve prosthesis. Aortic valve mean gradient measures 14.0 mmHg. Aortic  valve Vmax measures 2.51 m/s.   5. The inferior vena cava is normal in size with greater than 50%  respiratory variability, suggesting right atrial pressure of 3 mmHg.   FINDINGS   Left Ventricle: Left ventricular ejection fraction, by estimation, is 60  to 65%. The left ventricle has normal function. The left ventricle has no  regional wall motion abnormalities. The left ventricular internal cavity  size was normal in size. There is   mild left ventricular hypertrophy. Left ventricular diastolic parameters  are consistent with Grade I diastolic dysfunction (impaired relaxation).  Elevated left ventricular end-diastolic pressure.   Right Ventricle: The right ventricular size is normal. No increase in  right ventricular wall thickness. Right ventricular systolic function is  normal. There is normal pulmonary artery systolic pressure. The tricuspid  regurgitant velocity is 2.62 m/s, and   with an assumed right atrial pressure of 3 mmHg, the estimated right  ventricular systolic pressure is 30.5 mmHg.   Left Atrium: Left atrial  size was normal in size.   Right Atrium: Right atrial size was normal in size.   Pericardium: There is no evidence of pericardial effusion.   Mitral Valve: The mitral valve is normal in structure. Trivial mitral  valve regurgitation. No evidence of mitral valve stenosis.   Tricuspid Valve: The tricuspid valve is normal in structure. Tricuspid  valve regurgitation is trivial. No evidence of tricuspid stenosis.   Aortic Valve: Prosthetic valve gradients unchanged since 05/2019. The  aortic valve has been repaired/replaced. Aortic valve regurgitation is not  visualized. No aortic stenosis is present. Aortic valve mean gradient  measures 14.0 mmHg. Aortic valve peak  gradient measures 25.2 mmHg. There is a 23 mm stented (TAVR) valve present  in the aortic position. Echo findings are consistent with normal structure  and function of the aortic valve prosthesis.   Pulmonic Valve: The pulmonic valve was normal in structure. Pulmonic valve  regurgitation is not visualized. No evidence of pulmonic stenosis.   Aorta: The aortic root is normal in size and structure.   Venous: The inferior vena cava is normal in size with greater than 50%  respiratory variability, suggesting right atrial pressure of 3 mmHg.   IAS/Shunts: No atrial level shunt  detected by color flow Doppler.    Echocardiogram 10/15/2022 IMPRESSIONS     1. Left ventricular ejection fraction, by estimation, is 55 to 60%. The  left ventricle has normal function. The left ventricle has no regional  wall motion abnormalities. There is mild concentric left ventricular  hypertrophy. Left ventricular diastolic  parameters are consistent with Grade I diastolic dysfunction (impaired  relaxation).   2. Right ventricular systolic function is normal. The right ventricular  size is normal. There is normal pulmonary artery systolic pressure. The  estimated right ventricular systolic pressure is 23.2 mmHg.   3. Left atrial size was  mildly dilated.   4. The mitral valve is normal in structure. Trivial mitral valve  regurgitation. No evidence of mitral stenosis.   5. Bioprosthetic aortic valve s/p TAVR with 23 mm Medtronic  CoreValve-Evolut Pro THV. Mean gradient 13 mmHg with EOA 1.59 cm^2 and  dimensionless index 0.44. No significant peri-valvular leakage.   6. The inferior vena cava is normal in size with greater than 50%  respiratory variability, suggesting right atrial pressure of 3 mmHg.   FINDINGS   Left Ventricle: Left ventricular ejection fraction, by estimation, is 55  to 60%. The left ventricle has normal function. The left ventricle has no  regional wall motion abnormalities. The left ventricular internal cavity  size was normal in size. There is   mild concentric left ventricular hypertrophy. Left ventricular diastolic  parameters are consistent with Grade I diastolic dysfunction (impaired  relaxation).   Right Ventricle: The right ventricular size is normal. No increase in  right ventricular wall thickness. Right ventricular systolic function is  normal. There is normal pulmonary artery systolic pressure. The tricuspid  regurgitant velocity is 2.25 m/s, and   with an assumed right atrial pressure of 3 mmHg, the estimated right  ventricular systolic pressure is 23.2 mmHg.   Left Atrium: Left atrial size was mildly dilated.   Right Atrium: Right atrial size was normal in size.   Pericardium: There is no evidence of pericardial effusion.   Mitral Valve: The mitral valve is normal in structure. There is mild  calcification of the mitral valve leaflet(s). Mild mitral annular  calcification. Trivial mitral valve regurgitation. No evidence of mitral  valve stenosis.   Tricuspid Valve: The tricuspid valve is normal in structure. Tricuspid  valve regurgitation is trivial.   Aortic Valve: Bioprosthetic aortic valve s/p TAVR with 23 mm Medtronic  CoreValve-Evolut Pro THV. Mean gradient 13 mmHg with EOA  1.59 cm^2 and  dimensionless index 0.44. No significant peri-valvular regurgitation. The  aortic valve has been  repaired/replaced. Aortic valve regurgitation is not visualized. Aortic  valve mean gradient measures 13.0 mmHg. Aortic valve peak gradient  measures 21.3 mmHg. Aortic valve area, by VTI measures 1.49 cm. There is  a 23 mm Medtronic CoreValve-Evolut Pro  prosthetic, stented (TAVR) valve present in the aortic position. Procedure  Date: 06/06/2018.   Pulmonic Valve: The pulmonic valve was normal in structure. Pulmonic valve  regurgitation is not visualized.   Aorta: The aortic root is normal in size and structure.   Venous: The inferior vena cava is normal in size with greater than 50%  respiratory variability, suggesting right atrial pressure of 3 mmHg.   IAS/Shunts: No atrial level shunt detected by color flow Doppler.    Assessment & Plan   1.  Aortic stenosis s/p TAVR-no increased DOE or activity intolerance. 9/24 Echo TAVR valve with mean gradient of 13 mmHg.   12/22 echocardiogram showed  normal functioning aortic valve.  Continue current medical therapy Continue to increase physical activity as tolerated  Essential hypertension-BP today 110/64 Continue  hydrochlorothiazide Maintain blood pressure log Low-sodium diet  Palpitations-heart rate today 68 bpm.  Denies episodes of irregular or accelerated heart rate. Avoid triggers caffeine, chocolate, EtOH, dehydration etc.-reviewed  Hyperlipidemia-LDL 75 on 03/18/21. 05/24/2022: VLDL 45.2 09/16/2022: Cholesterol, Total 254; HDL 52; LDL Chol Calc (NIH) 160; Triglycerides 228 Continue aspirin, rosuvastatin High-fiber diet Repeat fasting lipids and LFTs 5/25  Disposition: Follow-up with Dr. Jens Som or me in 6-9 months.   Thomasene Ripple. Lazariah Savard NP-C     11/16/2022, 11:41 AM [facility] [address] Office [phone] Fax [phone]    I spent 14 minutes examining this  patient, reviewing medications, and using patient centered shared decision making involving her cardiac care.  Prior to her visit I spent greater than 20 minutes reviewing her past medical history,  medications, and prior cardiac tests.

## 2022-11-16 ENCOUNTER — Ambulatory Visit: Payer: Medicare Other | Attending: General Practice | Admitting: General Practice

## 2022-11-16 ENCOUNTER — Encounter: Payer: Self-pay | Admitting: General Practice

## 2022-11-16 VITALS — BP 110/64 | HR 68 | Ht 61.0 in | Wt 213.4 lb

## 2022-11-16 DIAGNOSIS — I2510 Atherosclerotic heart disease of native coronary artery without angina pectoris: Secondary | ICD-10-CM | POA: Diagnosis not present

## 2022-11-16 DIAGNOSIS — I350 Nonrheumatic aortic (valve) stenosis: Secondary | ICD-10-CM | POA: Insufficient documentation

## 2022-11-16 DIAGNOSIS — I10 Essential (primary) hypertension: Secondary | ICD-10-CM | POA: Insufficient documentation

## 2022-11-16 NOTE — Patient Instructions (Signed)
Medication Instructions:  The current medical regimen is effective;  continue present plan and medications as directed. Please refer to the Current Medication list given to you today.  *If you need a refill on your cardiac medications before your next appointment, please call your pharmacy*  Lab Work: NONE  Other Instructions INCREASE PHYSICAL ACTIVITY AS TOLERATED  Please try to avoid these triggers: Do not use any products that have nicotine or tobacco in them. These include cigarettes, e-cigarettes, and chewing tobacco. If you need help quitting, ask your doctor. Eat heart-healthy foods. Talk with your doctor about the right eating plan for you. Exercise regularly as told by your doctor. Stay hydrated Do not drink alcohol, Caffeine or chocolate. Lose weight if you are overweight. Do not use drugs, including cannabis  Follow-Up: At [facility], you and your health needs are our priority.  As part of our continuing mission to provide you with exceptional heart care, we have created designated Provider Care Teams.  These Care Teams include your primary Cardiologist (physician) and Advanced Practice Providers (APPs -  Physician Assistants and Nurse Practitioners) who all work together to provide you with the care you need, when you need it.  Your next appointment:   6-9 month(s)  Provider:   Olga Millers, MD  or Edd Fabian, FNP

## 2022-12-22 NOTE — Progress Notes (Signed)
Chief Complaint  Patient presents with   Medical Management of Chronic Issues    Pt is here for 3 months follow up. 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     HPI: Anna Gamble 87 y.o. come in for Chronic disease management   See past notes and hospital  .   Off  metformin now and  hesitant to use many meds  Falling risk  balance  but not  from loc cv sx  Using wc and  other assistance at home  No major change since last visit otherwise  No vomiting  low bg noted  no abd pain today  no major weight changes  ROS: See pertinent positives and negatives per HPI.  Past Medical History:  Diagnosis Date   CAD (coronary artery disease) 03/19/2014   Colon polyp    Coronary artery disease    GERD (gastroesophageal reflux disease)    Hx of colonoscopy with polypectomy 07/14/2012   medoff    Hyperlipidemia    Hypertension    Impaired fasting glucose    Morbid obesity (HCC)    S/P TAVR (transcatheter aortic valve replacement)    Severe aortic stenosis     Family History  Problem Relation Age of Onset   Coronary artery disease Other        female- 1st degree relative   Coronary artery disease Other        female- 1st degree relative   Hypertension Other    Hyperlipidemia Other    Heart attack Mother        age 33's   Heart attack Father        age 36's   Stroke Brother        died    Hearing loss Son    Sleep apnea Son    Aortic stenosis Sister    Stroke Brother    Heart attack Brother    Rheum arthritis Sister    Osteoarthritis Sister    Arthritis Sister    Heart Problems Brother     Social History   Socioeconomic History   Marital status: Married    Spouse name: Not on file   Number of children: 4   Years of education: Not on file   Highest education level: Not on file  Occupational History   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   Occupation:  Charity fundraiser, peds, cardiology    Comment: retired  Tobacco Use   Smoking status: Never   Smokeless tobacco: Never  Vaping Use   Vaping status: Never Used  Substance and Sexual Activity   Alcohol use: No   Drug use: No   Sexual activity: Not Currently    Comment: quit 40 yrs ago  Other Topics Concern   Not on file  Social History Narrative   hh of 3 -4  Married   Grand son   At home    Invalid son paraplegia and husband    And grandson recently     No pets   Fluctuating hh membors she cooks and prepares for them          Social Determinants of Health   Financial Resource Strain: Low Risk  (08/13/2022)   Overall Financial Resource Strain (CARDIA)    Difficulty of Paying Living Expenses: Not hard at all  Food Insecurity: No Food Insecurity (08/31/2022)   Hunger  Vital Sign    Worried About Programme researcher, broadcasting/film/video in the Last Year: Never true    Ran Out of Food in the Last Year: Never true  Transportation Needs: No Transportation Needs (08/31/2022)   PRAPARE - Administrator, Civil Service (Medical): No    Lack of Transportation (Non-Medical): No  Physical Activity: Inactive (08/13/2022)   Exercise Vital Sign    Days of Exercise per Week: 0 days    Minutes of Exercise per Session: 0 min  Stress: No Stress Concern Present (08/13/2022)   [employer] of Occupational Health - Occupational Stress Questionnaire    Feeling of Stress : Not at all  Social Connections: Socially Integrated (08/13/2022)   Social Connection and Isolation Panel [NHANES]    Frequency of Communication with Friends and Family: More than three times a week    Frequency of Social Gatherings with Friends and Family: More than three times a week    Attends Religious Services: More than 4 times per year    Active Member of [employer] or Organizations: Yes    Attends Engineer, structural: More than 4 times per year    Marital Status: Married    Outpatient Medications Prior to Visit  Medication Sig Dispense  Refill   acetaminophen (TYLENOL) 325 MG tablet Take 1-2 tablets (325-650 mg total) by mouth every 4 (four) hours as needed for mild pain.     aspirin 81 MG tablet Take 81 mg by mouth daily.     hydrochlorothiazide (HYDRODIURIL) 12.5 MG tablet Take 1/2 tablet (6.25 mg total) by mouth daily. 15 tablet 0   Lancets (ACCU-CHEK SOFT TOUCH) lancets Use as instructed 100 each 12   Misc Natural Products (TART CHERRY ADVANCED PO) Take 1 capsule by mouth daily.      Multiple Vitamins-Minerals (MULTIVITAMIN WITH MINERALS) tablet Take 1 tablet by mouth daily.     Multiple Vitamins-Minerals (PRESERVISION AREDS 2+MULTI VIT PO) Take 1 tablet by mouth daily.     RABEprazole (ACIPHEX) 20 MG tablet Take 20 mg by mouth daily.     famotidine (PEPCID) 10 MG tablet Take 1 tablet (10 mg total) by mouth daily. (Patient not taking: Reported on 11/16/2022) 30 tablet 0   liver oil-zinc oxide (DESITIN) 40 % ointment Apply topically 2 (two) times daily. To mass (Patient not taking: Reported on 12/23/2022) 56.7 g 0   loratadine (CLARITIN) 10 MG tablet Take 1 tablet (10 mg total) by mouth at bedtime. (Patient not taking: Reported on 11/16/2022) 100 tablet 0   melatonin 5 MG TABS Take 1 tablet (5 mg total) by mouth at bedtime as needed. (Patient not taking: Reported on 11/16/2022) 30 tablet 0   Menthol-Methyl Salicylate (MUSCLE RUB) 10-15 % CREA Apply 1 Application topically 3 (three) times daily before meals. To bilateral knees (Patient not taking: Reported on 11/16/2022) 85 g 0   potassium chloride (KLOR-CON M) 10 MEQ tablet Take 1 tablet (10 mEq total) by mouth daily. (Patient not taking: Reported on 11/16/2022) 30 tablet 0   sodium bicarbonate 650 MG tablet Take 1 tablet (650 mg total) by mouth daily. (Patient not taking: Reported on 11/16/2022) 30 tablet 0   Continuous Glucose Receiver (FREESTYLE LIBRE 3 READER) DEVI Use daily to check CBGs (Patient not taking: Reported on 11/16/2022) 1 each 0   Continuous Glucose Sensor  (FREESTYLE LIBRE 3 SENSOR) MISC Place 1 sensor on the skin every 14 days. Use to check glucose continuously (Patient not taking: Reported on 11/16/2022) 2  each 3   Polyethyl Glycol-Propyl Glycol (SYSTANE OP) Place 2 drops into both eyes in the morning, at noon, and at bedtime. (Patient not taking: Reported on 11/16/2022)     Vitamin D, Ergocalciferol, (DRISDOL) 1.25 MG (50000 UNIT) CAPS capsule Take 1 capsule (50,000 Units total) by mouth every 7 (seven) days. (Patient not taking: Reported on 11/16/2022) 5 capsule 0   No facility-administered medications prior to visit.     EXAM:  BP 122/64 (BP Location: Right Arm, Patient Position: Sitting, Cuff Size: Large)   Pulse 69   Temp 97.9 F (36.6 C) (Oral)   Ht 5\' 1"  (1.549 m)   Wt 211 lb (95.7 kg)   SpO2 96%   BMI 39.87 kg/m   Body mass index is 39.87 kg/m. Wt Readings from Last 3 Encounters:  12/23/22 211 lb (95.7 kg)  11/16/22 213 lb 6.4 oz (96.8 kg)  09/23/22 212 lb 6.4 oz (96.3 kg)    GENERAL: vitals reviewed and listed above, alert, oriented, appears well hydrated and in no acute distress in self propelled wc  alert and cognition intact  HEENT: atraumatic, conjunctiva  clear, no obvious abnormalities on inspection of external nose and ears  NECK: no obvious masses on inspection palpation  LUNGS: clear to auscultation bilaterally, no wheezes, rales or rhonchi, good air movement CV: HRRR, no clubbing cyanosis or   nl cap refill  MS: moves all extremities without noticeable focal  abnormality PSYCH: pleasant and cooperative, no obvious depression or anxiety  Able and prefers to m,anipulate wc with legs   in office   without cp sob  Lab Results  Component Value Date   WBC 5.9 09/23/2022   HGB 13.9 09/23/2022   HCT 42.0 09/23/2022   PLT 275.0 09/23/2022   GLUCOSE 131 (H) 09/23/2022   CHOL 254 (H) 09/16/2022   TRIG 228 (H) 09/16/2022   HDL 52 09/16/2022   LDLDIRECT 146.0 05/24/2022   LDLCALC 160 (H) 09/16/2022   ALT 24  09/16/2022   AST 27 09/16/2022   NA 136 09/23/2022   K 4.2 09/23/2022   CL 100 09/23/2022   CREATININE 0.82 09/23/2022   BUN 11 09/23/2022   CO2 31 09/23/2022   TSH 0.95 04/13/2021   INR 1.1 08/27/2022   HGBA1C 7.4 (A) 12/23/2022   MICROALBUR <0.7 05/24/2022   BP Readings from Last 3 Encounters:  12/23/22 122/64  11/16/22 110/64  09/23/22 136/76    ASSESSMENT AND PLAN:  Discussed the following assessment and plan:  Type 2 diabetes mellitus with hyperglycemia, without long-term current use of insulin (HCC) - Plan: POC HgB A1c  Feeling weak - exam reassuring no major change from previous visits  Medication management  Essential hypertension   A1c in acceptable range for age and conditions and  want to avoid further se  possible. She states can  monitor and attend to diet  and follow  Gets intermittent dizziy ness of off balance for a while and want to make sure not bp or bg  caused effect .  Reviewed  progress  . Doesn not appear to be a neuro event  Continue in cards fu as planned  -Patient advised to return or notify health care team  if  new concerns arise. 32 minutes of discussion and  counseling  and plan for dm control off meds and alarm sx to fu before next visit . Patient Instructions  Good to see you today .   A1c is 7.4  acceptable  for now  ok to not add any medication at this time.  Attend  to diet in inter I'm  for control.   Check your blood sugar intermittently and if gets dizzy feeling.    Let us know if BG is over  200 on a regular basis or too low  .Marland Kitchen   Consider   fiber  type food  .  And with thin  fiber breads .   Plan fu in early March and we can recheck the A1c  or other as needed.  Wt Readings from Last 3 Encounters:  12/23/22 211 lb (95.7 kg)  11/16/22 213 lb 6.4 oz (96.8 kg)  09/23/22 212 lb 6.4 oz (96.3 kg)    Risk of fall  . Prevention. Notification.   Neta Mends. Shalie Schremp M.D.

## 2022-12-23 ENCOUNTER — Ambulatory Visit: Payer: Medicare Other | Admitting: Internal Medicine

## 2022-12-23 VITALS — BP 122/64 | HR 69 | Temp 97.9°F | Ht 61.0 in | Wt 211.0 lb

## 2022-12-23 DIAGNOSIS — E1165 Type 2 diabetes mellitus with hyperglycemia: Secondary | ICD-10-CM | POA: Diagnosis not present

## 2022-12-23 DIAGNOSIS — R531 Weakness: Secondary | ICD-10-CM

## 2022-12-23 DIAGNOSIS — I10 Essential (primary) hypertension: Secondary | ICD-10-CM | POA: Diagnosis not present

## 2022-12-23 DIAGNOSIS — Z79899 Other long term (current) drug therapy: Secondary | ICD-10-CM | POA: Diagnosis not present

## 2022-12-23 LAB — POCT GLYCOSYLATED HEMOGLOBIN (HGB A1C): Hemoglobin A1C: 7.4 % — AB (ref 4.0–5.6)

## 2022-12-23 NOTE — Patient Instructions (Addendum)
Good to see you today .   A1c is 7.4  acceptable  for now  ok to not add any medication at this time.  Attend  to diet in inter I'm  for control.   Check your blood sugar intermittently and if gets dizzy feeling.    Let us know if BG is over  200 on a regular basis or too low  .Marland Kitchen   Consider   fiber  type food  .  And with thin  fiber breads .   Plan fu in early March and we can recheck the A1c  or other as needed.  Wt Readings from Last 3 Encounters:  12/23/22 211 lb (95.7 kg)  11/16/22 213 lb 6.4 oz (96.8 kg)  09/23/22 212 lb 6.4 oz (96.3 kg)    Risk of fall  . Prevention. Notification.

## 2022-12-28 ENCOUNTER — Encounter: Payer: Self-pay | Admitting: Internal Medicine

## 2023-01-04 ENCOUNTER — Other Ambulatory Visit (HOSPITAL_COMMUNITY): Payer: Self-pay

## 2023-01-04 ENCOUNTER — Other Ambulatory Visit: Payer: Self-pay | Admitting: Internal Medicine

## 2023-01-04 MED ORDER — RABEPRAZOLE SODIUM 20 MG PO TBEC
20.0000 mg | DELAYED_RELEASE_TABLET | Freq: Every day | ORAL | 2 refills | Status: AC
  Filled 2023-01-04: qty 30, 30d supply, fill #0

## 2023-01-05 ENCOUNTER — Other Ambulatory Visit (HOSPITAL_COMMUNITY): Payer: Self-pay

## 2023-02-07 ENCOUNTER — Telehealth: Payer: Self-pay

## 2023-02-07 ENCOUNTER — Other Ambulatory Visit: Payer: Self-pay | Admitting: Family

## 2023-02-07 ENCOUNTER — Telehealth: Payer: Self-pay | Admitting: Internal Medicine

## 2023-02-07 MED ORDER — ACCU-CHEK SOFTCLIX LANCETS MISC: 12 refills | Status: DC

## 2023-02-07 MED ORDER — ACCU-CHEK GUIDE TEST VI STRP: ORAL_STRIP | 12 refills | Status: DC

## 2023-02-07 MED ORDER — ACCU-CHEK SOFTCLIX LANCETS MISC: 12 refills | Status: AC

## 2023-02-07 NOTE — Telephone Encounter (Signed)
Copied from CRM [id number]. Topic: Clinical - Medication Refill >> Feb 07, 2023 10:33 AM Joanette Gula wrote: Most Recent Primary Care Visit:  Provider: [name]  Department: [facility]  Visit Type: OFFICE VISIT  Date: 12/23/2022  Medication: Lancets (ACCU-CHEK SOFT TOUCH) lancets   Has the patient contacted their pharmacy? Yes (Agent: If no, request that the patient contact the pharmacy for the refill. If patient does not wish to contact the pharmacy document the reason why and proceed with request.) (Agent: If yes, when and what did the pharmacy advise?)  Is this the correct pharmacy for this prescription? Yes If no, delete pharmacy and type the correct one.  This is the patient's preferred pharmacy:    [facility] [id number] - [place], [place] - [address] [place] [address] Phone: [phone] Fax: [phone]    Has the prescription been filled recently? Yes  Is the patient out of the medication? Yes  Has the patient been seen for an appointment in the last year OR does the patient have an upcoming appointment? Yes  Can we respond through MyChart? No  Agent: Please be advised that Rx refills may take up to 3 business days. We ask that you follow-up with your pharmacy.

## 2023-02-07 NOTE — Telephone Encounter (Signed)
Copied from CRM [id number]. Topic: Clinical - Medication Refill >> Feb 07, 2023 10:40 AM Joanette Gula wrote: Most Recent Primary Care Visit:  Provider: [name]  Department: [facility]  Visit Type: OFFICE VISIT  Date: 12/23/2022  Medication: Lancets (ACCU-CHEK SOFT TOUCH) lancets   and Test Strips... A new order is needed.  Has the patient contacted their pharmacy? Yes (Agent: If no, request that the patient contact the pharmacy for the refill. If patient does not wish to contact the pharmacy document the reason why and proceed with request.) (Agent: If yes, when and what did the pharmacy advise?)  Is this the correct pharmacy for this prescription? Yes If no, delete pharmacy and type the correct one.  This is the patient's preferred pharmacy:  [facility] - [place], [place] - [address] [place] [address] Phone: [phone] Fax: [phone]  [facility] [id number] [place], [place] - [address] [place] [address] Phone: [phone] Fax: [phone]  [facility] [address] [place] [address] Phone: [phone] Fax: [phone]   Has the prescription been filled recently? Yes  Is the patient out of the medication? Yes  Has the patient been seen for an appointment in the last year OR does the patient have an upcoming appointment? Yes  Can we respond through MyChart? No  Agent: Please be advised that Rx refills may take up to 3 business days. We ask that you follow-up with your pharmacy.

## 2023-02-21 DIAGNOSIS — E119 Type 2 diabetes mellitus without complications: Secondary | ICD-10-CM | POA: Diagnosis not present

## 2023-02-21 DIAGNOSIS — H53143 Visual discomfort, bilateral: Secondary | ICD-10-CM | POA: Diagnosis not present

## 2023-02-21 LAB — HM DIABETES EYE EXAM

## 2023-02-24 ENCOUNTER — Other Ambulatory Visit: Payer: Self-pay | Admitting: Cardiology

## 2023-03-15 DIAGNOSIS — K2100 Gastro-esophageal reflux disease with esophagitis, without bleeding: Secondary | ICD-10-CM | POA: Diagnosis not present

## 2023-03-15 DIAGNOSIS — R1319 Other dysphagia: Secondary | ICD-10-CM | POA: Diagnosis not present

## 2023-03-22 NOTE — Progress Notes (Unsigned)
 No chief complaint on file.   HPI: Anna Gamble 88 y.o. come in for Chronic disease management  Dm last visit 12 24  had se of meds  Last A1c 4.4  ROS: See pertinent positives and negatives per HPI.  Past Medical History:  Diagnosis Date   CAD (coronary artery disease) 03/19/2014   Colon polyp    Coronary artery disease    GERD (gastroesophageal reflux disease)    Hx of colonoscopy with polypectomy 07/14/2012   medoff    Hyperlipidemia    Hypertension    Impaired fasting glucose    Morbid obesity (HCC)    S/P TAVR (transcatheter aortic valve replacement)    Severe aortic stenosis     Family History  Problem Relation Age of Onset   Coronary artery disease Other        female- 1st degree relative   Coronary artery disease Other        female- 1st degree relative   Hypertension Other    Hyperlipidemia Other    Heart attack Mother        age 89's   Heart attack Father        age 84's   Stroke Brother        died    Hearing loss Son    Sleep apnea Son    Aortic stenosis Sister    Stroke Brother    Heart attack Brother    Rheum arthritis Sister    Osteoarthritis Sister    Arthritis Sister    Heart Problems Brother     Social History   Socioeconomic History   Marital status: Married    Spouse name: Not on file   Number of children: 4   Years of education: Not on file   Highest education level: Some college, no degree  Occupational History   Occupation: Retired    Comment: Takes care of Music therapist   Occupation: Charity fundraiser, peds, cardiology    Comment: retired  Tobacco Use   Smoking status: Never   Smokeless tobacco: Never  Vaping Use   Vaping status: Never Used  Substance and Sexual Activity   Alcohol use: No   Drug use: No   Sexual activity: Not Currently    Comment: quit 40 yrs ago  Other Topics Concern   Not on file  Social History Narrative   hh of 3 -4  Married   Grand son   At home    Invalid son paraplegia and husband    And grandson  recently     No pets   Fluctuating hh membors she cooks and prepares for them          Social Drivers of Health   Financial Resource Strain: Low Risk  (03/16/2023)   Overall Financial Resource Strain (CARDIA)    Difficulty of Paying Living Expenses: Not hard at all  Food Insecurity: No Food Insecurity (03/16/2023)   Hunger Vital Sign    Worried About Running Out of Food in the Last Year: Never true    Ran Out of Food in the Last Year: Never true  Transportation Needs: No Transportation Needs (03/16/2023)   PRAPARE - Administrator, Civil Service (Medical): No    Lack of Transportation (Non-Medical): No  Physical Activity: Inactive (03/16/2023)   Exercise Vital Sign    Days of Exercise per Week: 0 days    Minutes of Exercise per Session: 0 min  Stress: No Stress Concern Present (03/16/2023)  [employer] of Occupational Health - Occupational Stress Questionnaire    Feeling of Stress : Not at all  Social Connections: Socially Integrated (03/16/2023)   Social Connection and Isolation Panel [NHANES]    Frequency of Communication with Friends and Family: Twice a week    Frequency of Social Gatherings with Friends and Family: Once a week    Attends Religious Services: More than 4 times per year    Active Member of [employer] or Organizations: Yes    Attends Engineer, structural: More than 4 times per year    Marital Status: Married    Outpatient Medications Prior to Visit  Medication Sig Dispense Refill   Accu-Chek Softclix Lancets lancets Use as instructed 100 each 12   acetaminophen (TYLENOL) 325 MG tablet Take 1-2 tablets (325-650 mg total) by mouth every 4 (four) hours as needed for mild pain.     aspirin 81 MG tablet Take 81 mg by mouth daily.     famotidine (PEPCID) 10 MG tablet Take 1 tablet (10 mg total) by mouth daily. (Patient not taking: Reported on 11/16/2022) 30 tablet 0   glucose blood (ACCU-CHEK GUIDE TEST) test strip Use as instructed 100 each 12    hydrochlorothiazide (HYDRODIURIL) 12.5 MG tablet Take 1/2 tablet (6.25 mg total) by mouth daily. 15 tablet 0   hydrochlorothiazide (MICROZIDE) 12.5 MG capsule TAKE 1 CAPSULE BY MOUTH DAILY 90 capsule 2   liver oil-zinc oxide (DESITIN) 40 % ointment Apply topically 2 (two) times daily. To mass (Patient not taking: Reported on 12/23/2022) 56.7 g 0   loratadine (CLARITIN) 10 MG tablet Take 1 tablet (10 mg total) by mouth at bedtime. (Patient not taking: Reported on 11/16/2022) 100 tablet 0   melatonin 5 MG TABS Take 1 tablet (5 mg total) by mouth at bedtime as needed. (Patient not taking: Reported on 11/16/2022) 30 tablet 0   Menthol-Methyl Salicylate (MUSCLE RUB) 10-15 % CREA Apply 1 Application topically 3 (three) times daily before meals. To bilateral knees (Patient not taking: Reported on 11/16/2022) 85 g 0   Misc Natural Products (TART CHERRY ADVANCED PO) Take 1 capsule by mouth daily.      Multiple Vitamins-Minerals (MULTIVITAMIN WITH MINERALS) tablet Take 1 tablet by mouth daily.     Multiple Vitamins-Minerals (PRESERVISION AREDS 2+MULTI VIT PO) Take 1 tablet by mouth daily.     potassium chloride (KLOR-CON M) 10 MEQ tablet Take 1 tablet (10 mEq total) by mouth daily. (Patient not taking: Reported on 11/16/2022) 30 tablet 0   RABEprazole (ACIPHEX) 20 MG tablet Take 1 tablet (20 mg total) by mouth daily. 30 tablet 2   sodium bicarbonate 650 MG tablet Take 1 tablet (650 mg total) by mouth daily. (Patient not taking: Reported on 11/16/2022) 30 tablet 0   No facility-administered medications prior to visit.     EXAM:  There were no vitals taken for this visit.  There is no height or weight on file to calculate BMI.  GENERAL: vitals reviewed and listed above, alert, oriented, appears well hydrated and in no acute distress HEENT: atraumatic, conjunctiva  clear, no obvious abnormalities on inspection of external nose and ears OP : no lesion edema or exudate  NECK: no obvious masses on inspection  palpation  LUNGS: clear to auscultation bilaterally, no wheezes, rales or rhonchi, good air movement CV: HRRR, no clubbing cyanosis or  peripheral edema nl cap refill  MS: moves all extremities without noticeable focal  abnormality PSYCH: pleasant and cooperative, no obvious depression  or anxiety Lab Results  Component Value Date   WBC 5.9 09/23/2022   HGB 13.9 09/23/2022   HCT 42.0 09/23/2022   PLT 275.0 09/23/2022   GLUCOSE 131 (H) 09/23/2022   CHOL 254 (H) 09/16/2022   TRIG 228 (H) 09/16/2022   HDL 52 09/16/2022   LDLDIRECT 146.0 05/24/2022   LDLCALC 160 (H) 09/16/2022   ALT 24 09/16/2022   AST 27 09/16/2022   NA 136 09/23/2022   K 4.2 09/23/2022   CL 100 09/23/2022   CREATININE 0.82 09/23/2022   BUN 11 09/23/2022   CO2 31 09/23/2022   TSH 0.95 04/13/2021   INR 1.1 08/27/2022   HGBA1C 7.4 (A) 12/23/2022   MICROALBUR <0.7 05/24/2022   BP Readings from Last 3 Encounters:  12/23/22 122/64  11/16/22 110/64  09/23/22 136/76    ASSESSMENT AND PLAN:  Discussed the following assessment and plan:  Type 2 diabetes mellitus with hyperglycemia, without long-term current use of insulin (HCC)  Medication management  Essential hypertension a1c -Patient advised to return or notify health care team  if  new concerns arise.  There are no Patient Instructions on file for this visit.   Neta Mends. Rajveer Handler M.D.

## 2023-03-23 ENCOUNTER — Encounter: Payer: Self-pay | Admitting: Internal Medicine

## 2023-03-23 ENCOUNTER — Ambulatory Visit (INDEPENDENT_AMBULATORY_CARE_PROVIDER_SITE_OTHER): Payer: Medicare Other | Admitting: Internal Medicine

## 2023-03-23 VITALS — BP 110/76 | HR 74 | Temp 97.8°F | Ht 61.0 in | Wt 210.4 lb

## 2023-03-23 DIAGNOSIS — H53149 Visual discomfort, unspecified: Secondary | ICD-10-CM

## 2023-03-23 DIAGNOSIS — Z79899 Other long term (current) drug therapy: Secondary | ICD-10-CM

## 2023-03-23 DIAGNOSIS — R6884 Jaw pain: Secondary | ICD-10-CM | POA: Diagnosis not present

## 2023-03-23 DIAGNOSIS — E1165 Type 2 diabetes mellitus with hyperglycemia: Secondary | ICD-10-CM

## 2023-03-23 DIAGNOSIS — I10 Essential (primary) hypertension: Secondary | ICD-10-CM

## 2023-03-23 LAB — POCT GLYCOSYLATED HEMOGLOBIN (HGB A1C): Hemoglobin A1C: 7.6 % — AB (ref 4.0–5.6)

## 2023-03-23 NOTE — Patient Instructions (Signed)
 A1c is 7.6     creeping up  So we can have you continue to work on diet and  activity.   Consider  adding mild meds .  Such as Januvia  I will have Marylene Land our pharmacist  reach out and  chart review   advice about meds to limit side effects.   Bp is good today .  Ok to refill the Aciphex when needed.

## 2023-04-04 ENCOUNTER — Other Ambulatory Visit (INDEPENDENT_AMBULATORY_CARE_PROVIDER_SITE_OTHER)

## 2023-04-04 DIAGNOSIS — E1165 Type 2 diabetes mellitus with hyperglycemia: Secondary | ICD-10-CM

## 2023-04-04 DIAGNOSIS — E785 Hyperlipidemia, unspecified: Secondary | ICD-10-CM

## 2023-04-04 NOTE — Progress Notes (Unsigned)
 04/05/2023 Name: Anna Gamble MRN: [id number] DOB: May 26, 1935  Chief Complaint  Patient presents with   Hyperlipidemia   Diabetes   Anna Gamble is a 88 y.o. year old female who presented for a telephone visit.   They were referred to the pharmacist by their PCP for assistance in managing diabetes and hyperlipidemia.   Subjective:  Care Team: Primary Care Provider: [name], MD ; Next Scheduled Visit: not scheduled Cardiologist: Lewayne Bunting, MD ; Next Scheduled Visit: 05/24/2023  Medication Access/Adherence  Current Pharmacy:  [facility] - [place], [place] - [address] [place] [address] Phone: [phone] Fax: [phone]  [facility] [id number] [place], [place] - [address] [facility] [address] [place] [address] Phone: [phone] Fax: [phone]  [facility] [address] [place] [address] Phone: [phone] Fax: [phone]   Patient reports affordability concerns with their medications: No  Patient reports access/transportation concerns to their pharmacy: No  Patient reports adherence concerns with their medications:  No    Diabetes: Current medications: None  Medications tried in the past: Metformin (possible lactic acidosis - 08/2022 hospital admission), Ozempic, rybelsus, novolog, farxiga   Current glucose readings: Avg BG 130s, morning BG before breakfast today was 144.  No low BGs reported  Patient reports testing two to three times a week  Patient denies hypoglycemic s/sx including dizziness, shakiness, sweating.  Patient denies hyperglycemic symptoms including polyuria, polydipsia, polyphagia, nocturia, neuropathy, blurred vision.  Current meal patterns: - Breakfast: eggs, 1 slice of sourdough bread, piece of meat (sausage)  - Lunch: piece of bread, potato, carrots, 1 slice of palmetto cheese  - Supper:  corn, beef, vegetables - carrots, onions, 1 boiled red potato Patient reports limiting bread intake - in moderation   Hyperlipidemia/ASCVD Risk Reduction  Current lipid lowering medications: Rosuvastatin 5mg  daily Medications tried in the past: intolerance to rosuvastatin 40mg , atorvastatin  Patient reports obtaining medication from mail order pharmacy; stated that Dr. Jens Som (cardiology) will likely increase dose in the future if she tolerates.  Objective:  Lab Results  Component Value Date   HGBA1C 7.6 (A) 03/23/2023    Lab Results  Component Value Date   CREATININE 0.82 09/23/2022   BUN 11 09/23/2022   NA 136 09/23/2022   K 4.2 09/23/2022   CL 100 09/23/2022   CO2 31 09/23/2022    Lab Results  Component Value Date   CHOL 254 (H) 09/16/2022   HDL 52 09/16/2022   LDLCALC 160 (H) 09/16/2022   LDLDIRECT 146.0 05/24/2022   TRIG 228 (H) 09/16/2022   CHOLHDL 4.9 (H) 09/16/2022    Medications Reviewed Today     Reviewed by Sherrill Raring, RPH (Pharmacist) on 04/05/23 at 1104  Med List Status: <None>   Medication Order Taking? Sig Documenting Provider Last Dose Status Informant  Accu-Chek Softclix Lancets lancets [id number]  Use as instructed Eulis Foster, FNP  Active   acetaminophen (TYLENOL) 325 MG tablet [id number]  Take 1-2 tablets (325-650 mg total) by mouth every 4 (four) hours as needed for mild pain. Jerene Pitch  Active   aspirin 81 MG tablet [id number] Yes Take 81 mg by mouth daily. [provider] Taking Active Self, Pharmacy Records  famotidine (PEPCID) 10 MG tablet [id number]  Take 1 tablet (10 mg total) by mouth daily.  Patient not taking: Reported on 03/23/2023   Delle Reining  S, PA-C  Active   glucose blood (ACCU-CHEK GUIDE TEST) test strip [id number]  Use as instructed Worthy Rancher B, FNP  Active   hydrochlorothiazide (MICROZIDE) 12.5 MG capsule [id number] Yes TAKE 1 CAPSULE BY MOUTH DAILY Crenshaw, Madolyn Frieze, MD Taking Active   liver  oil-zinc oxide (DESITIN) 40 % ointment [id number]  Apply topically 2 (two) times daily. To mass  Patient not taking: Reported on 12/23/2022   Love, Pamela S, PA-C  Active   loratadine (CLARITIN) 10 MG tablet [id number] No Take 1 tablet (10 mg total) by mouth at bedtime.  Patient not taking: Reported on 04/04/2023   Jerene Pitch Not Taking Active   melatonin 5 MG TABS [id number]  Take 1 tablet (5 mg total) by mouth at bedtime as needed.  Patient not taking: Reported on 03/23/2023   Love, Pamela S, PA-C  Active   Menthol-Methyl Salicylate (MUSCLE RUB) 10-15 % CREA [id number]  Apply 1 Application topically 3 (three) times daily before meals. To bilateral knees  Patient taking differently: Apply 1 Application topically 3 (three) times daily before meals. To bilateral knees as needed.   Jacquelynn Cree, PA-C  Active   Misc Natural Products (TART CHERRY ADVANCED PO) [id number]  Take 1 capsule by mouth daily.  [provider]  Active Self, Pharmacy Records           Med Note Aurther Loft, Kenn File Jun 14, 2018  1:46 PM)    Multiple Vitamins-Minerals (MULTIVITAMIN WITH MINERALS) tablet [id number] Yes Take 1 tablet by mouth daily. [provider] Taking Active Self, Pharmacy Records  Multiple Vitamins-Minerals (PRESERVISION AREDS 2+MULTI VIT PO) [id number]  Take 1 tablet by mouth daily. [provider]  Active Self, Pharmacy Records  potassium chloride (KLOR-CON M) 10 MEQ tablet [id number] No Take 1 tablet (10 mEq total) by mouth daily.  Patient not taking: Reported on 11/16/2022   Jacquelynn Cree, PA-C Not Taking Active   RABEprazole (ACIPHEX) 20 MG tablet [id number]  Take 1 tablet (20 mg total) by mouth daily. Worthy Rancher B, FNP  Active   rosuvastatin (CRESTOR) 5 MG tablet [id number] Yes Take 5 mg by mouth daily. [provider] Taking Active   sodium bicarbonate 650 MG tablet [id number]  Take 1 tablet (650 mg total) by mouth daily.  Patient not taking: Reported on  03/23/2023   Love, Pamela S, PA-C  Active               Assessment/Plan:   Diabetes: - Currently controlled, A1c goal <8%, recent A1c trending up  - Reviewed long term cardiovascular and renal outcomes of uncontrolled blood sugar - Reviewed goal A1c, goal fasting, and goal 2 hour post prandial glucose - Reviewed dietary modifications including limiting starchy foods, such as potatoes, cheese, and bread  - Recommend to start Januvia 100 mg daily, but can consider 50 mg given patient's history of intolerance to medications; patient wants to think more about it and requests a f/u call on wednesday   Hyperlipidemia - Currently controlled, LDL goal <100 - Reviewed long term complications of uncontrolled cholesterol - Recommend to continue with current therapy   Patient likely to get lipid panel completed at next cardiologist appointment 05/24/23   Follow Up Plan: 04/06/23  Verdene Rio, PharmD PGY1 Pharmacy Resident   Sherrill Raring, PharmD Clinical Pharmacist [phone]

## 2023-04-05 ENCOUNTER — Telehealth: Payer: Self-pay

## 2023-04-05 NOTE — Progress Notes (Signed)
   04/05/2023  Patient ID: Anna Gamble, female   DOB: November 14, 1935, 88 y.o.   MRN: [id number]  Patient called in stating that she would like to discuss further the Januvia vs considering insulin as well. Patient was busy thought and requested a new f/u appt. Scheduled for 3/19 at 9:30am for phone call.  Sherrill Raring, PharmD Clinical Pharmacist [phone]

## 2023-04-06 ENCOUNTER — Other Ambulatory Visit

## 2023-04-06 DIAGNOSIS — E1165 Type 2 diabetes mellitus with hyperglycemia: Secondary | ICD-10-CM

## 2023-04-06 DIAGNOSIS — Z7984 Long term (current) use of oral hypoglycemic drugs: Secondary | ICD-10-CM

## 2023-04-06 NOTE — Progress Notes (Signed)
 Ok to send in 50 mg disp 90 refill x 1

## 2023-04-06 NOTE — Progress Notes (Unsigned)
 04/06/2023 Name: Anna Gamble MRN: [id number] DOB: November 25, 1935  Chief Complaint  Patient presents with   Diabetes   Anna Gamble is a 88 y.o. year old female who presented for a telephone visit.   They were referred to the pharmacist by their PCP for assistance in managing diabetes and hyperlipidemia.   Subjective:  Care Team: Primary Care Provider: [name], MD ; Next Scheduled Visit: not scheduled Cardiologist: Lewayne Bunting, MD ; Next Scheduled Visit: 05/24/2023  Medication Access/Adherence  Current Pharmacy:  [facility] - [place], [place] - [address] [place] [address] Phone: [phone] Fax: [phone]  [facility] [id number] [place], [place] - [address] [facility] [address] [facility] [address] [place] [address] Phone: [phone] Fax: [phone]  [facility] [address] [place] [address] Phone: [phone] Fax: [phone]   Patient reports affordability concerns with their medications: No  Patient reports access/transportation concerns to their pharmacy: No  Patient reports adherence concerns with their medications:  No    Diabetes: Current medications: None  Medications tried in the past: Metformin (possible lactic acidosis - 08/2022 hospital admission), Ozempic, rybelsus, novolog, farxiga   Current glucose readings: Avg BG 130s, morning BG before breakfast today was 144.  No low BGs reported  Patient reports testing two to three times a week  Patient denies hypoglycemic s/sx including dizziness, shakiness, sweating.  Patient denies hyperglycemic symptoms including polyuria, polydipsia, polyphagia, nocturia, neuropathy, blurred vision.  Current meal patterns: - Breakfast: eggs, 1 slice of sourdough bread, piece of meat (sausage)  - Lunch: piece of bread, potato, carrots, 1 slice of palmetto cheese  - Supper: corn, beef,  vegetables - carrots, onions, 1 boiled red potato Patient reports limiting bread intake - in moderation   Hyperlipidemia/ASCVD Risk Reduction  Current lipid lowering medications: Rosuvastatin 5mg  daily Medications tried in the past: intolerance to rosuvastatin 40mg , atorvastatin  Patient reports obtaining medication from mail order pharmacy; stated that Dr. Jens Som (cardiology) will likely increase dose in the future if she tolerates.  Objective:  Lab Results  Component Value Date   HGBA1C 7.6 (A) 03/23/2023    Lab Results  Component Value Date   CREATININE 0.82 09/23/2022   BUN 11 09/23/2022   NA 136 09/23/2022   K 4.2 09/23/2022   CL 100 09/23/2022   CO2 31 09/23/2022    Lab Results  Component Value Date   CHOL 254 (H) 09/16/2022   HDL 52 09/16/2022   LDLCALC 160 (H) 09/16/2022   LDLDIRECT 146.0 05/24/2022   TRIG 228 (H) 09/16/2022   CHOLHDL 4.9 (H) 09/16/2022    Medications Reviewed Today     Reviewed by Sherrill Raring, RPH (Pharmacist) on 04/06/23 at [phone]  Med List Status: <None>   Medication Order Taking? Sig Documenting Provider Last Dose Status Informant  Accu-Chek Softclix Lancets lancets [id number] No Use as instructed Eulis Foster, FNP Taking Active   acetaminophen (TYLENOL) 325 MG tablet [id number] No Take 1-2 tablets (325-650 mg total) by mouth every 4 (four) hours as needed for mild pain. Jerene Pitch Taking Active   aspirin 81 MG tablet [id number] No Take 81 mg by mouth daily. [provider] Taking Active Self, Pharmacy Records  famotidine (PEPCID) 10 MG tablet [id number] No Take 1 tablet (10 mg total) by mouth daily.  Patient not taking: Reported on 03/23/2023   Jacquelynn Cree, [place] Not  Taking Active   glucose blood (ACCU-CHEK GUIDE TEST) test strip [id number] No Use as instructed Worthy Rancher B, FNP Taking Active   hydrochlorothiazide (MICROZIDE) 12.5 MG capsule [id number] No TAKE 1 CAPSULE BY MOUTH DAILY Crenshaw, Madolyn Frieze, MD Taking  Active   liver oil-zinc oxide (DESITIN) 40 % ointment [id number] No Apply topically 2 (two) times daily. To mass  Patient not taking: Reported on 12/23/2022   Jacquelynn Cree, PA-C Not Taking Active   loratadine (CLARITIN) 10 MG tablet [id number] No Take 1 tablet (10 mg total) by mouth at bedtime.  Patient not taking: Reported on 04/04/2023   Jerene Pitch Not Taking Active   melatonin 5 MG TABS [id number] No Take 1 tablet (5 mg total) by mouth at bedtime as needed.  Patient not taking: Reported on 03/23/2023   Jacquelynn Cree, PA-C Not Taking Active   Menthol-Methyl Salicylate (MUSCLE RUB) 10-15 % CREA [id number] No Apply 1 Application topically 3 (three) times daily before meals. To bilateral knees  Patient taking differently: Apply 1 Application topically 3 (three) times daily before meals. To bilateral knees as needed.   Jacquelynn Cree, PA-C Taking Active   Misc Natural Products (TART CHERRY ADVANCED PO) [id number] No Take 1 capsule by mouth daily.  [provider] Taking Active Self, Pharmacy Records           Med Note Aurther Loft, Kenn File Jun 14, 2018  1:46 PM)    Multiple Vitamins-Minerals (MULTIVITAMIN WITH MINERALS) tablet [id number] No Take 1 tablet by mouth daily. [provider] Taking Active Self, Pharmacy Records  Multiple Vitamins-Minerals (PRESERVISION AREDS 2+MULTI VIT PO) [id number] No Take 1 tablet by mouth daily. [provider] Taking Active Self, Pharmacy Records  potassium chloride (KLOR-CON M) 10 MEQ tablet [id number] No Take 1 tablet (10 mEq total) by mouth daily.  Patient not taking: Reported on 11/16/2022   Jacquelynn Cree, PA-C Not Taking Active   RABEprazole (ACIPHEX) 20 MG tablet [id number] No Take 1 tablet (20 mg total) by mouth daily. Worthy Rancher B, FNP Taking Active   rosuvastatin (CRESTOR) 5 MG tablet [id number] No Take 5 mg by mouth daily. [provider] Taking Active   sodium bicarbonate 650 MG tablet [id number] No Take 1  tablet (650 mg total) by mouth daily.  Patient not taking: Reported on 03/23/2023   Jacquelynn Cree, PA-C Not Taking Active               Assessment/Plan:   Diabetes: - Currently controlled, A1c goal <8%, recent A1c trending up  - Reviewed long term cardiovascular and renal outcomes of uncontrolled blood sugar - Reviewed goal A1c, goal fasting, and goal 2 hour post prandial glucose - Reviewed dietary modifications including limiting starchy foods, such as potatoes, cheese, and bread  -Counseled to check sugars daily once starting new therapy - Patient would like to try Januvia 50mg  and will consider increase to 100mg  if she tolerates well and sugars are still elevated   Follow Up Plan: 04/25/23    Sherrill Raring, PharmD Clinical Pharmacist [phone]

## 2023-04-07 MED ORDER — SITAGLIPTIN PHOSPHATE 50 MG PO TABS: 50.0000 mg | ORAL_TABLET | Freq: Every day | ORAL | 1 refills | Status: DC

## 2023-04-25 ENCOUNTER — Other Ambulatory Visit (INDEPENDENT_AMBULATORY_CARE_PROVIDER_SITE_OTHER)

## 2023-04-25 DIAGNOSIS — E1165 Type 2 diabetes mellitus with hyperglycemia: Secondary | ICD-10-CM

## 2023-04-25 NOTE — Progress Notes (Signed)
   04/25/2023  Patient ID: Anna Gamble, female   DOB: 1935/12/02, 88 y.o.   MRN: [id number]   Contacted patient via telephone for scheduled f/u for diabetes per PCP referral.  Patient confirms she started the Januvia last week and seems to be tolerating well so far.   Reports she has yet to see much of an impact on her sugar readings at this time, still seeing fasting readings in the 150s-160s.  Counseled that it may take another week or so to begin to see effect on BG since she just started.  Agree to f/u in 1 month. Patient will call sooner if she begins to have toleration/BG issues. If she continues to tolerate well but BG still elevated, consider dose increase to 100mg .  Sherrill Raring, PharmD Clinical Pharmacist [phone]

## 2023-05-10 NOTE — Progress Notes (Signed)
 HPI: FU aortic stenosis/AVR. Placed on beta blocker previously secondary to PVCs. Found to have severe aortic stenosis on follow-up echocardiogram March 2020. Cardiac catheterization revealed 40% proximal right coronary artery and 20% ostial left main. Carotid Dopplers May 2020 showed 1 to 39% bilateral stenosis. Had TAVR May 2020. Monitor June 2020 showed sinus rhythm with occasional PAC, PVC, couplet and brief PAT. Last echocardiogram 9/24 showed normal LV function, mild left ventricular hypertrophy, grade 1 diastolic dysfunction, mild left atrial enlargement, status post aortic valve replacement with mean gradient 13 mmHg and no aortic insufficiency.  Since last seen, patient has dyspnea with more vigorous activities but not routine activities.  No orthopnea, PND, pedal edema, chest pain or syncope.  Current Outpatient Medications  Medication Sig Dispense Refill   Accu-Chek Softclix Lancets lancets Use as instructed 100 each 12   acetaminophen  (TYLENOL ) 325 MG tablet Take 1-2 tablets (325-650 mg total) by mouth every 4 (four) hours as needed for mild pain.     aspirin  81 MG tablet Take 81 mg by mouth daily.     glucose blood (ACCU-CHEK GUIDE TEST) test strip Use as instructed 100 each 12   hydrochlorothiazide  (MICROZIDE ) 12.5 MG capsule TAKE 1 CAPSULE BY MOUTH DAILY 90 capsule 2   Menthol -Methyl Salicylate  (MUSCLE RUB) 10-15 % CREA Apply 1 Application topically 3 (three) times daily before meals. To bilateral knees (Patient taking differently: Apply 1 Application topically 3 (three) times daily before meals. To bilateral knees as needed.) 85 g 0   Misc Natural Products (TART CHERRY ADVANCED PO) Take 1 capsule by mouth daily.      Multiple Vitamins-Minerals (MULTIVITAMIN WITH MINERALS) tablet Take 1 tablet by mouth daily.     RABEprazole  (ACIPHEX ) 20 MG tablet Take 1 tablet (20 mg total) by mouth daily. 30 tablet 2   rosuvastatin  (CRESTOR ) 5 MG tablet Take 5 mg by mouth daily.     sitaGLIPtin   (JANUVIA ) 50 MG tablet Take 1 tablet (50 mg total) by mouth daily. 90 tablet 1   sodium bicarbonate  650 MG tablet Take 1 tablet (650 mg total) by mouth daily. 30 tablet 0   famotidine  (PEPCID ) 10 MG tablet Take 1 tablet (10 mg total) by mouth daily. (Patient not taking: Reported on 11/16/2022) 30 tablet 0   potassium chloride  (KLOR-CON  M) 10 MEQ tablet Take 1 tablet (10 mEq total) by mouth daily. (Patient not taking: Reported on 05/24/2023) 30 tablet 0   No current facility-administered medications for this visit.     Past Medical History:  Diagnosis Date   CAD (coronary artery disease) 03/19/2014   Colon polyp    Coronary artery disease    GERD (gastroesophageal reflux disease)    Hx of colonoscopy with polypectomy 07/14/2012   medoff    Hyperlipidemia    Hypertension    Impaired fasting glucose    Morbid obesity (HCC)    S/P TAVR (transcatheter aortic valve replacement)    Severe aortic stenosis     Past Surgical History:  Procedure Laterality Date   APPENDECTOMY     CHOLECYSTECTOMY     FOOT SURGERY     rt   KNEE SURGERY     rt   LEFT AND RIGHT HEART CATHETERIZATION WITH CORONARY ANGIOGRAM N/A 10/10/2013   Procedure: LEFT AND RIGHT HEART CATHETERIZATION WITH CORONARY ANGIOGRAM;  Surgeon: Odie Benne, MD;  Location: [facility] CATH LAB;  Service: Cardiovascular;  Laterality: N/A;   RIGHT/LEFT HEART CATH AND CORONARY ANGIOGRAPHY N/A 04/06/2018  Procedure: RIGHT/LEFT HEART CATH AND CORONARY ANGIOGRAPHY;  Surgeon: Odie Benne, MD;  Location: [facility] INVASIVE CV LAB;  Service: Cardiovascular;  Laterality: N/A;   TEE WITHOUT CARDIOVERSION N/A 06/06/2018   Procedure: TRANSESOPHAGEAL ECHOCARDIOGRAM (TEE);  Surgeon: Odie Benne, MD;  Location: [facility] OR;  Service: Open Heart Surgery;  Laterality: N/A;   TONSILLECTOMY     TOTAL KNEE ARTHROPLASTY  02/01/2011   Procedure: TOTAL KNEE ARTHROPLASTY;  Surgeon: Ilean Mall;  Location: [facility] OR;  Service: Orthopedics;  Laterality:  Right;  DEPUY/SIGMA   TRANSCATHETER AORTIC VALVE REPLACEMENT, TRANSFEMORAL N/A 06/06/2018   Procedure: TRANSCATHETER AORTIC VALVE REPLACEMENT, TRANSFEMORAL;  Surgeon: Odie Benne, MD;  Location: [facility] OR;  Service: Open Heart Surgery;  Laterality: N/A;    Social History   Socioeconomic History   Marital status: Married    Spouse name: Not on file   Number of children: 4   Years of education: Not on file   Highest education level: Some college, no degree  Occupational History   Occupation: Retired    Comment: Takes care of Music therapist   Occupation: Charity fundraiser, peds, cardiology    Comment: retired  Tobacco Use   Smoking status: Never   Smokeless tobacco: Never  Vaping Use   Vaping status: Never Used  Substance and Sexual Activity   Alcohol use: No   Drug use: No   Sexual activity: Not Currently    Comment: quit 40 yrs ago  Other Topics Concern   Not on file  Social History Narrative   hh of 3 -4  Married   Grand son   At home    Invalid son paraplegia and husband    And grandson recently     No pets   Fluctuating hh membors she cooks and prepares for them          Social Drivers of Health   Financial Resource Strain: Low Risk  (03/16/2023)   Overall Financial Resource Strain (CARDIA)    Difficulty of Paying Living Expenses: Not hard at all  Food Insecurity: No Food Insecurity (03/16/2023)   Hunger Vital Sign    Worried About Running Out of Food in the Last Year: Never true    Ran Out of Food in the Last Year: Never true  Transportation Needs: No Transportation Needs (03/16/2023)   PRAPARE - Administrator, Civil Service (Medical): No    Lack of Transportation (Non-Medical): No  Physical Activity: Inactive (03/16/2023)   Exercise Vital Sign    Days of Exercise per Week: 0 days    Minutes of Exercise per Session: 0 min  Stress: No Stress Concern Present (03/16/2023)   [employer] of Occupational Health - Occupational Stress Questionnaire     Feeling of Stress : Not at all  Social Connections: Socially Integrated (03/16/2023)   Social Connection and Isolation Panel [NHANES]    Frequency of Communication with Friends and Family: Twice a week    Frequency of Social Gatherings with Friends and Family: Once a week    Attends Religious Services: More than 4 times per year    Active Member of [employer] or Organizations: Yes    Attends Banker Meetings: More than 4 times per year    Marital Status: Married  Catering manager Violence: Not At Risk (08/31/2022)   Humiliation, Afraid, Rape, and Kick questionnaire    Fear of Current or Ex-Partner: No    Emotionally Abused: No    Physically Abused: No  Sexually Abused: No    Family History  Problem Relation Age of Onset   Coronary artery disease Other        female- 1st degree relative   Coronary artery disease Other        female- 1st degree relative   Hypertension Other    Hyperlipidemia Other    Heart attack Mother        age 39's   Heart attack Father        age 51's   Stroke Brother        died    Hearing loss Son    Sleep apnea Son    Aortic stenosis Sister    Stroke Brother    Heart attack Brother    Rheum arthritis Sister    Osteoarthritis Sister    Arthritis Sister    Heart Problems Brother     ROS: no fevers or chills, productive cough, hemoptysis, dysphasia, odynophagia, melena, hematochezia, dysuria, hematuria, rash, seizure activity, orthopnea, PND, pedal edema, claudication. Remaining systems are negative.  Physical Exam: Well-developed well-nourished in no acute distress.  Skin is warm and dry.  HEENT is normal.  Neck is supple.  Chest is clear to auscultation with normal expansion.  Cardiovascular exam is regular rate and rhythm.  2/6 systolic murmur left sternal border.  No diastolic murmur noted. Abdominal exam nontender or distended. No masses palpated. Extremities show no edema. neuro grossly intact   A/P  1 status post TAVR-most  recent echocardiogram showed normally functioning valve.  Continue SBE prophylaxis.  2 coronary artery disease-mild on most recent catheterization prior to TAVR.  Continue medical therapy with aspirin  and statin.  Patient denies chest pain.  3 hypertension-patient's blood pressure is controlled today.  Continue present medications.  Check potassium and renal function.  4 hyperlipidemia-continue statin.  Check lipids and liver.  5 palpitations-continue beta-blocker as needed.  She has not had significant symptoms recently.  6 history of dyspnea-felt to be multifactorial including obesity hypoventilation syndrome as well as deconditioning.  Symptoms are unchanged.  No plans for further cardiac evaluation at this point.  Alexandria Angel, MD

## 2023-05-11 DIAGNOSIS — H0012 Chalazion right lower eyelid: Secondary | ICD-10-CM | POA: Diagnosis not present

## 2023-05-23 ENCOUNTER — Other Ambulatory Visit (INDEPENDENT_AMBULATORY_CARE_PROVIDER_SITE_OTHER)

## 2023-05-23 DIAGNOSIS — E1165 Type 2 diabetes mellitus with hyperglycemia: Secondary | ICD-10-CM

## 2023-05-23 NOTE — Progress Notes (Signed)
 05/23/2023 Name: Anna Gamble MRN: [id number] DOB: [birth year]  Chief Complaint  Patient presents with   Medication Management   Diabetes   Anna Gamble is a 88 y.o. year old female who presented for a telephone visit.   They were referred to the pharmacist by their PCP for assistance in managing diabetes and hyperlipidemia.   Subjective:  Care Team: Primary Care Provider: Reginal Capra, Gamble ; Next Scheduled Visit: 07/14/23 Cardiologist: Anna Quince, Gamble ; Next Scheduled Visit: 05/24/2023  Medication Access/Adherence  Current Pharmacy:  [facility] - [place], [place] - [address] [place] [address] Phone: [phone] Fax: [phone]  [facility] [id number] [place] Gam[place], [place] - [address] [facility] [address] [facility] [address] [place] [address] Phone: [phone] Fax: [phone]  [facility] [address] [place] [address] Phone: [phone] Fax: [phone]   Patient reports affordability concerns with their medications: No  Patient reports access/transportation concerns to their pharmacy: No  Patient reports adherence concerns with their medications:  No    Diabetes: Current medications: Januvia  50mg  1 tab daily Medications tried in the past: Metformin  (possible lactic acidosis - 08/2022 hospital admission), Ozempic , rybelsus , novolog , farxiga    Current glucose readings: checking fasting sugars a few times a week, still reports lowest she has seen is 144, ranging from 144-179  Patient denies hypoglycemic s/sx including dizziness, shakiness, sweating.  Patient denies hyperglycemic symptoms including polyuria, polydipsia, polyphagia, nocturia, neuropathy, blurred vision.  Reports some mild stomach pain, she is unsure if this is related to the Januvia   Current meal patterns: - Breakfast: eggs, 1 slice of sourdough bread, piece of meat (sausage)  -  Lunch: piece of bread, potato, carrots, 1 slice of pimento cheese  - Supper: corn, beef, vegetables - carrots, onions, 1 boiled red potato Patient reports limiting bread intake - in moderation   Hyperlipidemia/ASCVD Risk Reduction  Current lipid lowering medications: Rosuvastatin  5mg  daily Medications tried in the past: intolerance to rosuvastatin  40mg , atorvastatin   Patient reports obtaining medication from mail order pharmacy; stated that Dr. Audery Blazing (cardiology) will likely increase dose in the future if she tolerates.  Objective:  Lab Results  Component Value Date   HGBA1C 7.6 (A) 03/23/2023    Lab Results  Component Value Date   CREATININE 0.82 09/23/2022   BUN 11 09/23/2022   NA 136 09/23/2022   K 4.2 09/23/2022   CL 100 09/23/2022   CO2 31 09/23/2022    Lab Results  Component Value Date   CHOL 254 (H) 09/16/2022   HDL 52 09/16/2022   LDLCALC 160 (H) 09/16/2022   LDLDIRECT 146.0 05/24/2022   TRIG 228 (H) 09/16/2022   CHOLHDL 4.9 (H) 09/16/2022    Medications Reviewed Today     Reviewed by Anna Gamble, RPH (Pharmacist) on 05/23/23 at 1351  Med List Status: <None>   Medication Order Taking? Sig Documenting Provider Last Dose Status Informant  Accu-Chek Softclix Lancets lancets [id number] No Use as instructed Anna Gamble Taking Active   acetaminophen  (TYLENOL ) 325 MG tablet [id number] No Take 1-2 tablets (325-650 mg total) by mouth every 4 (four) hours as needed for mild pain. Anna Gamble Taking Active   aspirin  81 MG tablet [id number] No Take 81 mg by mouth daily. Provider, Historical, Gamble Taking Active Self, Pharmacy Records  famotidine  (PEPCID ) 10 MG tablet [id number] No Take 1 tablet (10 mg total) by  mouth daily.  Patient not taking: Reported on 03/23/2023   Anna Gamble, [place] Not Taking Active   glucose blood (ACCU-CHEK GUIDE TEST) test strip [id number] No Use as instructed Anna Gamble Taking Active   hydrochlorothiazide   (MICROZIDE ) 12.5 MG capsule [id number] No TAKE 1 CAPSULE BY MOUTH DAILY Anna Gamble Taking Active   liver oil-zinc  oxide (DESITIN) 40 % ointment [id number] No Apply topically 2 (two) times daily. To mass  Patient not taking: Reported on 12/23/2022   Anna Hickman, Gamble Not Taking Active   loratadine  (CLARITIN ) 10 MG tablet [id number] No Take 1 tablet (10 mg total) by mouth at bedtime.  Patient not taking: Reported on 04/04/2023   Anna Gamble Not Taking Active   melatonin 5 MG TABS [id number] No Take 1 tablet (5 mg total) by mouth at bedtime as needed.  Patient not taking: Reported on 03/23/2023   Anna Hickman, Gamble Not Taking Active   Menthol -Methyl Salicylate  (MUSCLE RUB) 10-15 % CREA [id number] No Apply 1 Application topically 3 (three) times daily before meals. To bilateral knees  Patient taking differently: Apply 1 Application topically 3 (three) times daily before meals. To bilateral knees as needed.   Anna Hickman, Gamble Taking Active   Misc Natural Products (TART CHERRY ADVANCED PO) [id number] No Take 1 capsule by mouth daily.  Provider, Historical, Gamble Taking Active Self, Pharmacy Records           Med Note Anna Gamble, Anna Gamble Jun 14, 2018  1:46 PM)    Multiple Vitamins-Minerals (MULTIVITAMIN WITH MINERALS) tablet [id number] No Take 1 tablet by mouth daily. Provider, Historical, Gamble Taking Active Self, Pharmacy Records  Multiple Vitamins-Minerals (PRESERVISION AREDS 2+MULTI VIT PO) [id number] No Take 1 tablet by mouth daily. Provider, Historical, Gamble Taking Active Self, Pharmacy Records  potassium chloride  (KLOR-CON  M) 10 MEQ tablet [id number] No Take 1 tablet (10 mEq total) by mouth daily.  Patient not taking: Reported on 11/16/2022   Anna Hickman, Gamble Not Taking Active   RABEprazole  (ACIPHEX ) 20 MG tablet [id number] No Take 1 tablet (20 mg total) by mouth daily. Anna Gamble Taking Active   rosuvastatin  (CRESTOR ) 5 MG tablet [id number] No Take 5 mg by mouth daily.  Provider, Historical, Gamble Taking Active   sitaGLIPtin  (JANUVIA ) 50 MG tablet [id number]  Take 1 tablet (50 mg total) by mouth daily. [name], Joaquim Muir, Gamble  Active   sodium bicarbonate  650 MG tablet [id number] No Take 1 tablet (650 mg total) by mouth daily.  Patient not taking: Reported on 03/23/2023   Anna Hickman, Gamble Not Taking Active               Assessment/Plan:   Diabetes: - Currently controlled, A1c goal <8%, recent A1c trending up  - Reviewed long term cardiovascular and renal outcomes of uncontrolled blood sugar - Reviewed goal A1c, goal fasting, and goal 2 hour post prandial glucose - Reviewed dietary modifications including limiting starchy foods, such as potatoes, cheese, and bread  -Counseled to check sugars daily once starting new therapy - Recommend increasing Januvia  to 100mg , patient declines. Will continue Januvia  50mg  and see what her A1C is at next PCP appt in June. May need to consider stopping Januvia  and starting insulin  if A1C has risen   Follow Up Plan: 08/10/23    Anna Gamble, PharmD Clinical Pharmacist [phone]

## 2023-05-24 ENCOUNTER — Ambulatory Visit: Payer: Medicare Other | Attending: Cardiology | Admitting: Cardiology

## 2023-05-24 ENCOUNTER — Encounter: Payer: Self-pay | Admitting: Cardiology

## 2023-05-24 VITALS — BP 122/66 | HR 86 | Ht 62.0 in | Wt 204.0 lb

## 2023-05-24 DIAGNOSIS — E7800 Pure hypercholesterolemia, unspecified: Secondary | ICD-10-CM | POA: Diagnosis not present

## 2023-05-24 DIAGNOSIS — I2510 Atherosclerotic heart disease of native coronary artery without angina pectoris: Secondary | ICD-10-CM | POA: Diagnosis not present

## 2023-05-24 DIAGNOSIS — Z952 Presence of prosthetic heart valve: Secondary | ICD-10-CM | POA: Diagnosis not present

## 2023-05-24 DIAGNOSIS — I10 Essential (primary) hypertension: Secondary | ICD-10-CM

## 2023-05-24 NOTE — Patient Instructions (Signed)

## 2023-05-27 DIAGNOSIS — E7800 Pure hypercholesterolemia, unspecified: Secondary | ICD-10-CM | POA: Diagnosis not present

## 2023-05-27 DIAGNOSIS — I10 Essential (primary) hypertension: Secondary | ICD-10-CM | POA: Diagnosis not present

## 2023-05-27 LAB — COMPREHENSIVE METABOLIC PANEL WITH GFR
ALT: 18 IU/L (ref 0–32)
AST: 18 IU/L (ref 0–40)
Albumin: 4.1 g/dL (ref 3.7–4.7)
Alkaline Phosphatase: 88 IU/L (ref 44–121)
BUN/Creatinine Ratio: 18 (ref 12–28)
BUN: 14 mg/dL (ref 8–27)
Bilirubin Total: 0.4 mg/dL (ref 0.0–1.2)
CO2: 24 mmol/L (ref 20–29)
Calcium: 9.7 mg/dL (ref 8.7–10.3)
Chloride: 102 mmol/L (ref 96–106)
Creatinine, Ser: 0.8 mg/dL (ref 0.57–1.00)
Globulin, Total: 2.5 g/dL (ref 1.5–4.5)
Glucose: 143 mg/dL — ABNORMAL HIGH (ref 70–99)
Potassium: 4.4 mmol/L (ref 3.5–5.2)
Sodium: 142 mmol/L (ref 134–144)
Total Protein: 6.6 g/dL (ref 6.0–8.5)
eGFR: 72 mL/min/{1.73_m2} (ref >59)

## 2023-05-27 LAB — LIPID PANEL
Chol/HDL Ratio: 3.2 ratio (ref 0.0–4.4)
Cholesterol, Total: 171 mg/dL (ref 100–199)
HDL: 53 mg/dL (ref >39)
LDL Chol Calc (NIH): 91 mg/dL (ref 0–99)
Triglycerides: 158 mg/dL — ABNORMAL HIGH (ref 0–149)
VLDL Cholesterol Cal: 27 mg/dL (ref 5–40)

## 2023-05-30 ENCOUNTER — Encounter: Payer: Self-pay | Admitting: *Deleted

## 2023-06-01 ENCOUNTER — Ambulatory Visit: Payer: Self-pay | Admitting: *Deleted

## 2023-07-06 ENCOUNTER — Other Ambulatory Visit: Payer: Self-pay | Admitting: Family

## 2023-07-14 ENCOUNTER — Encounter: Payer: Self-pay | Admitting: Internal Medicine

## 2023-07-14 ENCOUNTER — Ambulatory Visit: Admitting: Internal Medicine

## 2023-07-14 VITALS — BP 100/70 | HR 65 | Temp 97.8°F | Ht 62.0 in | Wt 208.6 lb

## 2023-07-14 DIAGNOSIS — E785 Hyperlipidemia, unspecified: Secondary | ICD-10-CM

## 2023-07-14 DIAGNOSIS — E1165 Type 2 diabetes mellitus with hyperglycemia: Secondary | ICD-10-CM | POA: Diagnosis not present

## 2023-07-14 DIAGNOSIS — G629 Polyneuropathy, unspecified: Secondary | ICD-10-CM

## 2023-07-14 DIAGNOSIS — Z79899 Other long term (current) drug therapy: Secondary | ICD-10-CM

## 2023-07-14 DIAGNOSIS — I10 Essential (primary) hypertension: Secondary | ICD-10-CM | POA: Diagnosis not present

## 2023-07-14 LAB — POCT GLYCOSYLATED HEMOGLOBIN (HGB A1C): Hemoglobin A1C: 7.2 % — AB (ref 4.0–5.6)

## 2023-07-14 MED ORDER — SITAGLIPTIN PHOSPHATE 50 MG PO TABS: 50.0000 mg | ORAL_TABLET | Freq: Every day | ORAL | 1 refills | Status: DC

## 2023-07-14 NOTE — Patient Instructions (Addendum)
 Stay on januvia     A1c is improved.  To 7.2  acceptable   want to keep peak BG under 200 at lest.  Continue lifestyle intervention healthy eating  fresh fruit and veges.

## 2023-07-14 NOTE — Progress Notes (Signed)
 Chief Complaint  Patient presents with   Medical Management of Chronic Issues    F/u on DM. Pt would like to discuss Junuvia and cholesterol medication.     HPI: Anna Gamble 88 y.o. come in for Chronic disease management   Followed cards s/p tavr cad  Dm diet controlled plusjanuvia   se of meds  and concern about se  Checking  bg about 2-3 x per week.   Stays  in 140s  but post prandial can go to 200  Working on limiting sweets and carbs   ROS: See pertinent positives and negatives per HPI. No longer drives cause of vision etc   but [place] and outside .  But  all of her frinds are dead but doing well with spouse of 21 yeasr Past Medical History:  Diagnosis Date   CAD (coronary artery disease) 03/19/2014   Colon polyp    Coronary artery disease    GERD (gastroesophageal reflux disease)    Hx of colonoscopy with polypectomy 07/14/2012   medoff    Hyperlipidemia    Hypertension    Impaired fasting glucose    Morbid obesity (HCC)    S/P TAVR (transcatheter aortic valve replacement)    Severe aortic stenosis     Family History  Problem Relation Age of Onset   Coronary artery disease Other        female- 1st degree relative   Coronary artery disease Other        female- 1st degree relative   Hypertension Other    Hyperlipidemia Other    Heart attack Mother        age 84's   Heart attack Father        age 23's   Stroke Brother        died    Hearing loss Son    Sleep apnea Son    Aortic stenosis Sister    Stroke Brother    Heart attack Brother    Rheum arthritis Sister    Osteoarthritis Sister    Arthritis Sister    Heart Problems Brother     Social History   Socioeconomic History   Marital status: Married    Spouse name: Not on file   Number of children: 4   Years of education: Not on file   Highest education level: Some college, no degree  Occupational History   Occupation: Retired    Comment: Takes care of Music therapist    Occupation: Charity fundraiser, peds, cardiology    Comment: retired  Tobacco Use   Smoking status: Never   Smokeless tobacco: Never  Vaping Use   Vaping status: Never Used  Substance and Sexual Activity   Alcohol use: No   Drug use: No   Sexual activity: Not Currently    Comment: quit 40 yrs ago  Other Topics Concern   Not on file  Social History Narrative   hh of 3 -4  Married   Grand son   At home    Invalid son paraplegia and husband    And grandson recently     No pets   Fluctuating hh membors she cooks and prepares for them          Social Drivers of Health   Financial Resource Strain: Low Risk  (03/16/2023)   Overall Financial Resource Strain (CARDIA)    Difficulty of Paying Living Expenses: Not hard at all  Food Insecurity: No Food Insecurity (03/16/2023)   Hunger  Vital Sign    Worried About Programme researcher, broadcasting/film/video in the Last Year: Never true    Ran Out of Food in the Last Year: Never true  Transportation Needs: No Transportation Needs (03/16/2023)   PRAPARE - Administrator, Civil Service (Medical): No    Lack of Transportation (Non-Medical): No  Physical Activity: Inactive (03/16/2023)   Exercise Vital Sign    Days of Exercise per Week: 0 days    Minutes of Exercise per Session: 0 min  Stress: No Stress Concern Present (03/16/2023)   [employer] of Occupational Health - Occupational Stress Questionnaire    Feeling of Stress : Not at all  Social Connections: Socially Integrated (03/16/2023)   Social Connection and Isolation Panel    Frequency of Communication with Friends and Family: Twice a week    Frequency of Social Gatherings with Friends and Family: Once a week    Attends Religious Services: More than 4 times per year    Active Member of [employer] or Organizations: Yes    Attends Engineer, structural: More than 4 times per year    Marital Status: Married    Outpatient Medications Prior to Visit  Medication Sig Dispense Refill   Accu-Chek Softclix  Lancets lancets Use as instructed 100 each 12   acetaminophen  (TYLENOL ) 325 MG tablet Take 1-2 tablets (325-650 mg total) by mouth every 4 (four) hours as needed for mild pain.     aspirin  81 MG tablet Take 81 mg by mouth daily.     famotidine  (PEPCID ) 10 MG tablet Take 1 tablet (10 mg total) by mouth daily. (Patient taking differently: Take 10 mg by mouth as needed.) 30 tablet 0   glucose blood (ACCU-CHEK GUIDE TEST) test strip Use as instructed 100 each 12   hydrochlorothiazide  (MICROZIDE ) 12.5 MG capsule TAKE 1 CAPSULE BY MOUTH DAILY 90 capsule 2   Menthol -Methyl Salicylate  (MUSCLE RUB) 10-15 % CREA Apply 1 Application topically 3 (three) times daily before meals. To bilateral knees (Patient taking differently: Apply 1 Application topically 3 (three) times daily before meals. To bilateral knees as needed.) 85 g 0   Misc Natural Products (TART CHERRY ADVANCED PO) Take 1 capsule by mouth daily.      Multiple Vitamins-Minerals (MULTIVITAMIN WITH MINERALS) tablet Take 1 tablet by mouth daily.     rosuvastatin  (CRESTOR ) 5 MG tablet Take 5 mg by mouth daily.     sitaGLIPtin  (JANUVIA ) 50 MG tablet Take 1 tablet (50 mg total) by mouth daily. 90 tablet 1   potassium chloride  (KLOR-CON  M) 10 MEQ tablet Take 1 tablet (10 mEq total) by mouth daily. (Patient not taking: Reported on 07/14/2023) 30 tablet 0   RABEprazole  (ACIPHEX ) 20 MG tablet Take 1 tablet (20 mg total) by mouth daily. (Patient not taking: Reported on 07/14/2023) 30 tablet 2   sodium bicarbonate  650 MG tablet Take 1 tablet (650 mg total) by mouth daily. (Patient not taking: Reported on 07/14/2023) 30 tablet 0   No facility-administered medications prior to visit.     EXAM:  BP 100/70 (BP Location: Left Arm, Patient Position: Sitting, Cuff Size: Large)   Pulse 65   Temp 97.8 F (36.6 C) (Oral)   Ht 5' 2 (1.575 m)   Wt 208 lb 9.6 oz (94.6 kg)   SpO2 96%   BMI 38.15 kg/m   Body mass index is 38.15 kg/m.  GENERAL: vitals reviewed  and listed above, alert, oriented, appears well hydrated and in no acute  distress  HEENT: atraumatic, conjunctiva  clear, no obvious abnormalities on inspection of external nose and ears  LUNGS: clear to auscultation bilaterally, no wheezes, rales or rhonchi,  CV: HRRR, no clubbing cyanosis or  peripheral edema nl cap refill  MS: moves all extremities without noticeable focal  abnormality in wc  propelss by feet  PSYCH: pleasant and cooperative, no obvious depression or anxiety nl cognition  Lab Results  Component Value Date   WBC 5.9 09/23/2022   HGB 13.9 09/23/2022   HCT 42.0 09/23/2022   PLT 275.0 09/23/2022   GLUCOSE 143 (H) 05/27/2023   CHOL 171 05/27/2023   TRIG 158 (H) 05/27/2023   HDL 53 05/27/2023   LDLDIRECT 146.0 05/24/2022   LDLCALC 91 05/27/2023   ALT 18 05/27/2023   AST 18 05/27/2023   NA 142 05/27/2023   K 4.4 05/27/2023   CL 102 05/27/2023   CREATININE 0.80 05/27/2023   BUN 14 05/27/2023   CO2 24 05/27/2023   TSH 0.95 04/13/2021   INR 1.1 08/27/2022   HGBA1C 7.2 (A) 07/14/2023   MICROALBUR <0.7 05/24/2022   BP Readings from Last 3 Encounters:  07/14/23 100/70  05/24/23 122/66  03/23/23 110/76   Diabetic foot exam was performed with the following findings:   No deformities, ulcerations, or other skin breakdown Intact posterior tibialis and dorsalis pedis pulses Dec sense  to ankle bilaterally but no ulcers sig callous or lesion       ASSESSMENT AND PLAN:  Discussed the following assessment and plan:  Type 2 diabetes mellitus with hyperglycemia, without long-term current use of insulin  (HCC) - Plan: POC HgB A1c  Neuropathy  Hyperlipidemia, unspecified hyperlipidemia type  Medication management  Essential hypertension Improved A1c  disc  diet changes and fu. Tolerating well but out of med should be ok until arrives  I dont think we have samples  Keep fu with  Jon and fu 4 mos  w me  Prevnetive foot care  she checks her feet q d   Counsel  review plan and fu 34 mintues today  -Patient advised to return or notify health care team  if  new concerns arise.  Patient Instructions  Stay on januvia     A1c is improved.  To 7.2  acceptable   want to keep peak BG under 200 at lest.  Continue lifestyle intervention healthy eating  fresh fruit and veges.    Kaloni Bisaillon K. Germaine Ripp M.D.

## 2023-08-10 ENCOUNTER — Other Ambulatory Visit (INDEPENDENT_AMBULATORY_CARE_PROVIDER_SITE_OTHER)

## 2023-08-10 DIAGNOSIS — E1165 Type 2 diabetes mellitus with hyperglycemia: Secondary | ICD-10-CM

## 2023-08-10 MED ORDER — ACCU-CHEK FASTCLIX LANCETS MISC: 1.0000 | Freq: Every day | 3 refills | Status: AC

## 2023-08-10 NOTE — Progress Notes (Signed)
   08/10/2023 Name: Anna Gamble MRN: [id number] DOB: [birth year]  Chief Complaint  Patient presents with   Medication Management   Diabetes   Anna Gamble is a 88 y.o. year old female who presented for a telephone visit.   They were referred to the pharmacist by their PCP for assistance in managing diabetes and hyperlipidemia.   Subjective:  Care Team: Primary Care Provider: Charlett Apolinar POUR, MD ; Next Scheduled Visit: 11/17/23  Medication Access/Adherence  Current Pharmacy:  [facility] - [place], [place] - [address] [place] [address] Phone: [phone] Fax: [phone]  [facility] [id number] [place], [place] - [address] [facility] [address] [facility] [address] [place] [address] Phone: [phone] Fax: [phone]  [facility] [address] [place] [address] Phone: [phone] Fax: [phone]   Patient reports affordability concerns with their medications: No  Patient reports access/transportation concerns to their pharmacy: No  Patient reports adherence concerns with their medications:  No    Diabetes: Current medications: Januvia  50mg  1 tab daily (just restarted this week after being out for about a month) Medications tried in the past: Metformin  (possible lactic acidosis - 08/2022 hospital admission), Ozempic , rybelsus , novolog , farxiga    Current glucose readings: checking fasting sugars a few times a week, still reports lowest she has seen is 144, ranging from 144-179  Patient denies hypoglycemic s/sx including dizziness, shakiness, sweating.  Patient denies hyperglycemic symptoms including polyuria, polydipsia, polyphagia, nocturia, neuropathy, blurred vision.  Reports some tongue soreness and is wondering if related to Januvia  (admits she does have dry mouth and cracks in tongue)  Current meal patterns: - Breakfast: eggs, 1 slice of sourdough  bread, piece of meat (sausage)  - Lunch: piece of bread, potato, carrots, 1 slice of pimento cheese  - Supper: corn, beef, vegetables - carrots, onions, 1 boiled red potato Patient reports limiting bread intake - in moderation   Objective:  Lab Results  Component Value Date   HGBA1C 7.2 (A) 07/14/2023    Lab Results  Component Value Date   CREATININE 0.80 05/27/2023   BUN 14 05/27/2023   NA 142 05/27/2023   K 4.4 05/27/2023   CL 102 05/27/2023   CO2 24 05/27/2023    Lab Results  Component Value Date   CHOL 171 05/27/2023   HDL 53 05/27/2023   LDLCALC 91 05/27/2023   LDLDIRECT 146.0 05/24/2022   TRIG 158 (H) 05/27/2023   CHOLHDL 3.2 05/27/2023    Medications Reviewed Today   Medications were not reviewed in this encounter       Assessment/Plan:   Diabetes: - Currently controlled, A1c goal <8% - Reviewed long term cardiovascular and renal outcomes of uncontrolled blood sugar - Reviewed goal A1c, goal fasting, and goal 2 hour post prandial glucose - Reviewed dietary modifications including limiting starchy foods, such as potatoes, cheese, and bread  -Counseled to check sugars daily once starting new therapy -Continue current medication therapy, notify office sooner than scheduled follow up of any concerns. Counseled that Januvia  is not a likely contributing of sore tongue and counseled on hydration. -Patient requests new rx for lancets and reports she needs fastclix brand, resending   Follow Up Plan: 09/07/23    Anna Gamble, PharmD Clinical Pharmacist [phone]

## 2023-08-12 ENCOUNTER — Telehealth: Payer: Self-pay | Admitting: Internal Medicine

## 2023-08-12 NOTE — Telephone Encounter (Signed)
 Copied from CRM [id number]. Topic: Clinical - Prescription Issue >> Aug 12, 2023 10:59 AM Gennette ORN wrote: Reason for CRM: Todd from [facility] [phone] is calling because he needs a reprint of prescription with diagnosis code .

## 2023-08-16 MED ORDER — ACCU-CHEK GUIDE TEST VI STRP: ORAL_STRIP | 12 refills | Status: DC

## 2023-08-16 NOTE — Telephone Encounter (Signed)
 Dx is  Dm comlicated without use of insulin   check out the dx from last  OV or  problem list ? IS that what you need?

## 2023-08-17 ENCOUNTER — Telehealth: Payer: Self-pay

## 2023-08-17 ENCOUNTER — Ambulatory Visit (INDEPENDENT_AMBULATORY_CARE_PROVIDER_SITE_OTHER): Payer: Medicare Other

## 2023-08-17 VITALS — Ht 62.0 in | Wt 204.0 lb

## 2023-08-17 DIAGNOSIS — Z0000 Encounter for general adult medical examination without abnormal findings: Secondary | ICD-10-CM

## 2023-08-17 MED ORDER — ACCU-CHEK GUIDE TEST VI STRP: ORAL_STRIP | 12 refills | Status: DC

## 2023-08-17 NOTE — Telephone Encounter (Signed)
 Copied from CRM [id number]. Topic: Clinical - Prescription Issue >> Aug 16, 2023  3:38 PM Henretta I wrote: Reason for CRM: Amie from [facility] called about patients prescription for glucose blood (ACCU-CHEK GUIDE TEST) test strip, as the frequency says just as directed but for insurance purposes they need to know  how many times a day patient is using to check blood sugar

## 2023-08-17 NOTE — Telephone Encounter (Signed)
 Follow up with pharmacist, Amy. She inform they received the new Rx with frequency of pt is checking her blood glucose. Also on lancets. No further information is needed at this time.

## 2023-08-17 NOTE — Telephone Encounter (Signed)
 Frequency is added to Rx. Rx resent.

## 2023-08-17 NOTE — Patient Instructions (Addendum)
 Anna Gamble , Thank you for taking time out of your busy schedule to complete your Annual Wellness Visit with me. I enjoyed our conversation and look forward to speaking with you again next year. I, as well as your care team,  appreciate your ongoing commitment to your health goals. Please review the following plan we discussed and let me know if I can assist you in the future. Your Game plan/ To Do List    Referrals: If you haven't heard from the office you've been referred to, please reach out to them at the phone provided.   Follow up Visits: Next Medicare AWV with our clinical staff: 08/22/24 @ 10:40a   Have you seen your provider in the last 6 months (3 months if uncontrolled diabetes)?  Next Office Visit with your provider: 11/17/23 @ 9a  Clinician Recommendations:  Aim for 30 minutes of exercise or brisk walking, 6-8 glasses of water, and 5 servings of fruits and vegetables each day.       This is a list of the screening recommended for you and due dates:  Health Maintenance  Topic Date Due   Yearly kidney health urinalysis for diabetes  Never done   Zoster (Shingles) Vaccine (1 of 2) Never done   DTaP/Tdap/Td vaccine (3 - Tdap) 01/01/2020   COVID-19 Vaccine (4 - 2024-25 season) 09/19/2022   Complete foot exam   03/23/2023   Flu Shot  08/19/2023   Hemoglobin A1C  01/13/2024   Eye exam for diabetics  02/21/2024   Yearly kidney function blood test for diabetes  05/26/2024   Medicare Annual Wellness Visit  08/16/2024   Pneumococcal Vaccine for age over 20  Completed   DEXA scan (bone density measurement)  Completed   Hepatitis B Vaccine  Aged Out   HPV Vaccine  Aged Out   Meningitis B Vaccine  Aged Out    Advanced directives: (Copy Requested) Please bring a copy of your health care power of attorney and living will to the office to be added to your chart at your convenience. You can mail to [facility] [address] [place], [place] [address] or email to  [email or web address] Advance Care Planning is important because it:  [x]  Makes sure you receive the medical care that is consistent with your values, goals, and preferences  [x]  It provides guidance to your family and loved ones and reduces their decisional burden about whether or not they are making the right decisions based on your wishes.  Follow the link provided in your after visit summary or read over the paperwork we have mailed to you to help you started getting your Advance Directives in place. If you need assistance in completing these, please reach out to us  so that we can help you!  See attachments for Preventive Care and Fall Prevention Tips.

## 2023-08-17 NOTE — Progress Notes (Signed)
 Subjective:   Anna Gamble is a 88 y.o. who presents for a Medicare Wellness preventive visit.  As a reminder, Annual Wellness Visits don't include a physical exam, and some assessments may be limited, especially if this visit is performed virtually. We may recommend an in-person follow-up visit with your provider if needed.  Visit Complete: Virtual I connected with  Anna Gamble on 08/17/23 by a audio enabled telemedicine application and verified that I am speaking with the correct person using two identifiers.  Patient Location: Home  Provider Location: Home Office  I discussed the limitations of evaluation and management by telemedicine. The patient expressed understanding and agreed to proceed.  Vital Signs: Because this visit was a virtual/telehealth visit, some criteria may be missing or patient reported. Any vitals not documented were not able to be obtained and vitals that have been documented are patient reported.    Persons Participating in Visit: Patient.  AWV Questionnaire: No: Patient Medicare AWV questionnaire was not completed prior to this visit.  Cardiac Risk Factors include: advanced age (>11men, >29 women);diabetes mellitus;hypertension     Objective:    Today's Vitals   08/17/23 1109  Weight: 204 lb (92.5 kg)  Height: 5' 2 (1.575 m)   Body mass index is 37.31 kg/m.     08/17/2023   11:19 AM 08/31/2022    2:54 PM 08/27/2022   10:35 PM 08/13/2022   11:58 AM 08/11/2021   12:41 PM 08/08/2021    8:40 AM 07/29/2020    1:15 PM  Advanced Directives  Does Patient Have a Medical Advance Directive? Yes Yes Yes Yes No No Yes  Type of Estate agent of [place];Living will Healthcare Power of [employer] of [place];Living will Healthcare Power of [place];Living will   Healthcare Power of Attorney  Does patient want to make changes to medical advance directive?  No - Patient declined       Copy of Healthcare Power  of Attorney in Chart? No - copy requested No - copy requested No - copy requested No - copy requested   No - copy requested  Would patient like information on creating a medical advance directive?      No - Patient declined     Current Medications (verified) Outpatient Encounter Medications as of 08/17/2023  Medication Sig   Accu-Chek FastClix Lancets MISC 1 each by Does not apply route daily. Use to test blood sugar once daily as instructed. Dx. E11.65   Accu-Chek Softclix Lancets lancets Use as instructed   acetaminophen  (TYLENOL ) 325 MG tablet Take 1-2 tablets (325-650 mg total) by mouth every 4 (four) hours as needed for mild pain.   aspirin  81 MG tablet Take 81 mg by mouth daily.   famotidine  (PEPCID ) 10 MG tablet Take 1 tablet (10 mg total) by mouth daily. (Patient taking differently: Take 10 mg by mouth as needed.)   glucose blood (ACCU-CHEK GUIDE TEST) test strip Check 1-2x a day.   hydrochlorothiazide  (MICROZIDE ) 12.5 MG capsule TAKE 1 CAPSULE BY MOUTH DAILY   Menthol -Methyl Salicylate  (MUSCLE RUB) 10-15 % CREA Apply 1 Application topically 3 (three) times daily before meals. To bilateral knees (Patient taking differently: Apply 1 Application topically 3 (three) times daily before meals. To bilateral knees as needed.)   Misc Natural Products (TART CHERRY ADVANCED PO) Take 1 capsule by mouth daily.    Multiple Vitamins-Minerals (MULTIVITAMIN WITH MINERALS) tablet Take 1 tablet by mouth daily.   potassium chloride  (KLOR-CON  M) 10 MEQ  tablet Take 1 tablet (10 mEq total) by mouth daily. (Patient not taking: Reported on 07/14/2023)   RABEprazole  (ACIPHEX ) 20 MG tablet Take 1 tablet (20 mg total) by mouth daily. (Patient not taking: Reported on 07/14/2023)   rosuvastatin  (CRESTOR ) 5 MG tablet Take 5 mg by mouth daily.   sitaGLIPtin  (JANUVIA ) 50 MG tablet Take 1 tablet (50 mg total) by mouth daily.   sodium bicarbonate  650 MG tablet Take 1 tablet (650 mg total) by mouth daily. (Patient not  taking: Reported on 07/14/2023)   No facility-administered encounter medications on file as of 08/17/2023.    Allergies (verified) Lisinopril, Statins, and Semaglutide    History: Past Medical History:  Diagnosis Date   CAD (coronary artery disease) 03/19/2014   Colon polyp    Coronary artery disease    GERD (gastroesophageal reflux disease)    Hx of colonoscopy with polypectomy 07/14/2012   medoff    Hyperlipidemia    Hypertension    Impaired fasting glucose    Morbid obesity (HCC)    S/P TAVR (transcatheter aortic valve replacement)    Severe aortic stenosis    Past Surgical History:  Procedure Laterality Date   APPENDECTOMY     CHOLECYSTECTOMY     FOOT SURGERY     rt   KNEE SURGERY     rt   LEFT AND RIGHT HEART CATHETERIZATION WITH CORONARY ANGIOGRAM N/A 10/10/2013   Procedure: LEFT AND RIGHT HEART CATHETERIZATION WITH CORONARY ANGIOGRAM;  Surgeon: Lonni JONETTA Cash, MD;  Location: [facility] CATH LAB;  Service: Cardiovascular;  Laterality: N/A;   RIGHT/LEFT HEART CATH AND CORONARY ANGIOGRAPHY N/A 04/06/2018   Procedure: RIGHT/LEFT HEART CATH AND CORONARY ANGIOGRAPHY;  Surgeon: Cash Lonni JONETTA, MD;  Location: [facility] INVASIVE CV LAB;  Service: Cardiovascular;  Laterality: N/A;   TEE WITHOUT CARDIOVERSION N/A 06/06/2018   Procedure: TRANSESOPHAGEAL ECHOCARDIOGRAM (TEE);  Surgeon: Cash Lonni JONETTA, MD;  Location: [facility] OR;  Service: Open Heart Surgery;  Laterality: N/A;   TONSILLECTOMY     TOTAL KNEE ARTHROPLASTY  02/01/2011   Procedure: TOTAL KNEE ARTHROPLASTY;  Surgeon: Dempsey JINNY Sensor;  Location: [facility] OR;  Service: Orthopedics;  Laterality: Right;  DEPUY/SIGMA   TRANSCATHETER AORTIC VALVE REPLACEMENT, TRANSFEMORAL N/A 06/06/2018   Procedure: TRANSCATHETER AORTIC VALVE REPLACEMENT, TRANSFEMORAL;  Surgeon: Cash Lonni JONETTA, MD;  Location: [facility] OR;  Service: Open Heart Surgery;  Laterality: N/A;   Family History  Problem Relation Age of Onset   Coronary artery disease Other         female- 1st degree relative   Coronary artery disease Other        female- 1st degree relative   Hypertension Other    Hyperlipidemia Other    Heart attack Mother        age 33's   Heart attack Father        age 54's   Stroke Brother        died    Hearing loss Son    Sleep apnea Son    Aortic stenosis Sister    Stroke Brother    Heart attack Brother    Rheum arthritis Sister    Osteoarthritis Sister    Arthritis Sister    Heart Problems Brother    Social History   Socioeconomic History   Marital status: Married    Spouse name: Not on file   Number of children: 4   Years of education: Not on file   Highest education level: Some college, no degree  Occupational History  Occupation: Retired    Comment: Takes care of Music therapist   Occupation: Charity fundraiser, peds, cardiology    Comment: retired  Tobacco Use   Smoking status: Never   Smokeless tobacco: Never  Vaping Use   Vaping status: Never Used  Substance and Sexual Activity   Alcohol use: No   Drug use: No   Sexual activity: Not Currently    Comment: quit 40 yrs ago  Other Topics Concern   Not on file  Social History Narrative   hh of 3 -4  Married   Grand son   At home    Invalid son paraplegia and husband    And grandson recently     No pets   Fluctuating hh membors she cooks and prepares for them          Social Drivers of Health   Financial Resource Strain: Low Risk  (08/17/2023)   Overall Financial Resource Strain (CARDIA)    Difficulty of Paying Living Expenses: Not hard at all  Food Insecurity: No Food Insecurity (08/17/2023)   Hunger Vital Sign    Worried About Running Out of Food in the Last Year: Never true    Ran Out of Food in the Last Year: Never true  Transportation Needs: No Transportation Needs (08/17/2023)   PRAPARE - Administrator, Civil Service (Medical): No    Lack of Transportation (Non-Medical): No  Physical Activity: Inactive (08/17/2023)   Exercise Vital Sign    Days  of Exercise per Week: 0 days    Minutes of Exercise per Session: 0 min  Stress: No Stress Concern Present (08/17/2023)   [employer] of Occupational Health - Occupational Stress Questionnaire    Feeling of Stress: Not at all  Social Connections: Socially Integrated (08/17/2023)   Social Connection and Isolation Panel    Frequency of Communication with Friends and Family: More than three times a week    Frequency of Social Gatherings with Friends and Family: More than three times a week    Attends Religious Services: More than 4 times per year    Active Member of [employer] or Organizations: Yes    Attends Engineer, structural: More than 4 times per year    Marital Status: Married    Tobacco Counseling Counseling given: Not Answered    Clinical Intake:  Pre-visit preparation completed: Yes  Pain : No/denies pain     BMI - recorded: 37.31 Nutritional Status: BMI > 30  Obese Nutritional Risks: None Diabetes: Yes CBG done?: Yes (CBG 154 Per patient) CBG resulted in Enter/ Edit results?: Yes Did pt. bring in CBG monitor from home?: No  Lab Results  Component Value Date   HGBA1C 7.2 (A) 07/14/2023   HGBA1C 7.6 (A) 03/23/2023   HGBA1C 7.4 (A) 12/23/2022     How often do you need to have someone help you when you read instructions, pamphlets, or other written materials from your doctor or pharmacy?: 1 - Never  Interpreter Needed?: No  Information entered by :: Rojelio Blush LPN   Activities of Daily Living      08/17/2023   11:17 AM 08/31/2022    2:54 PM  In your present state of health, do you have any difficulty performing the following activities:  Hearing? 0 0  Vision? 0 0  Difficulty concentrating or making decisions? 0 0  Walking or climbing stairs? 1 1  Comment Uses a Restaurant manager, fast food   Dressing or bathing? 0 1  Doing  errands, shopping? 0 0  Preparing Food and eating ? N   Using the Toilet? N   In the past six months, have you accidently leaked  urine? Y   Comment Wears Depends. Followed by medical attention   Do you have problems with loss of bowel control? N   Managing your Medications? N   Managing your Finances? N   Housekeeping or managing your Housekeeping? N     Patient Care Team: [name], Apolinar POUR, MD as PCP - General Pietro, Redell RAMAN, MD as PCP - Cardiology (Cardiology) Darlean Ozell NOVAK, MD (Pulmonary Disease) Gaspar Kung, MD (Orthopedic Surgery) Luis Purchase, MD (Gastroenterology) Pietro Redell RAMAN, MD as Attending Physician (Cardiology) Cleotilde Sewer, OD (Optometry) [place], Jon DEL, [facility] (Pharmacist)   I have updated your Care Teams any recent Medical Services you may have received from other providers in the past year.     Assessment:   This is a routine wellness examination for Anna Gamble.  Hearing/Vision screen Hearing Screening - Comments:: Denies hearing difficulties   Vision Screening - Comments:: Wears rx glasses - up to date with routine eye exams with  Dr Cleotilde   Goals Addressed               This Visit's Progress     Increase physical activity (pt-stated)        Get more active       Depression Screen      08/17/2023   11:16 AM 08/13/2022   11:53 AM 03/23/2022   11:30 AM 01/08/2022   10:41 AM 09/10/2021    1:15 PM 08/11/2021   12:42 PM 07/14/2021    9:41 AM  PHQ 2/9 Scores  PHQ - 2 Score 0 0 0 0 0 0 1  PHQ- 9 Score   2 1 2  6     Fall Risk      08/17/2023   11:18 AM 08/13/2022   11:55 AM 03/23/2022   11:30 AM 01/08/2022   10:42 AM 09/10/2021    1:15 PM  Fall Risk   Falls in the past year? 1 1 1 1 1   Number falls in past yr: 0 0 1 1 1   Injury with Fall? 0 0 1 1 1   Risk for fall due to : No Fall Risks No Fall Risks Other (Comment) Impaired balance/gait;Impaired mobility Other (Comment)  Follow up Falls evaluation completed Falls prevention discussed Falls evaluation completed Falls evaluation completed  Falls evaluation completed      Data saved with a previous flowsheet row  definition    MEDICARE RISK AT HOME:   Medicare Risk at Home Any stairs in or around the home?: No If so, are there any without handrails?: No Home free of loose throw rugs in walkways, pet beds, electrical cords, etc?: Yes Adequate lighting in your home to reduce risk of falls?: Yes Life alert?: No Use of a cane, walker or w/c?: Yes Grab bars in the bathroom?: Yes Shower chair or bench in shower?: Yes Elevated toilet seat or a handicapped toilet?: Yes  TIMED UP AND GO:  Was the test performed?  No  Cognitive Function: 6CIT completed        08/17/2023   11:19 AM 08/13/2022   11:59 AM 08/11/2021   12:45 PM 07/29/2020    1:20 PM 07/24/2019    2:06 PM  6CIT Screen  What Year? 0 points 0 points 0 points 0 points 0 points  What month? 0 points 0 points 0 points 0  points 0 points  What time? 0 points 0 points 0 points 0 points 0 points  Count back from 20 0 points 0 points 0 points 0 points 0 points  Months in reverse 0 points 0 points 0 points 0 points 0 points  Repeat phrase 0 points 0 points 4 points 0 points 2 points  Total Score 0 points 0 points 4 points 0 points 2 points    Immunizations Immunization History  Administered Date(s) Administered   Fluad Quad(high Dose 65+) 10/06/2018, 01/10/2020   Fluad Trivalent(High Dose 65+) 09/23/2022   Influenza Split 09/18/2012   Influenza Whole 12/27/2006, 11/01/2007, 11/08/2008, 10/29/2009, 12/02/2011   Influenza, High Dose Seasonal PF 10/27/2016, 11/14/2017   Influenza-Unspecified 12/19/2014, 11/01/2021   PFIZER(Purple Top)SARS-COV-2 Vaccination 02/07/2019, 02/28/2019, 05/16/2020   Pneumococcal Conjugate-13 01/15/2013   Pneumococcal Polysaccharide-23 01/13/2002, 04/16/2015   Td 03/18/1996, 12/31/2009    Screening Tests Health Maintenance  Topic Date Due   Diabetic kidney evaluation - Urine ACR  Never done   Zoster Vaccines- Shingrix (1 of 2) Never done   DTaP/Tdap/Td (3 - Tdap) 01/01/2020   COVID-19 Vaccine (4 - 2024-25  season) 09/19/2022   FOOT EXAM  03/23/2023   INFLUENZA VACCINE  08/19/2023   HEMOGLOBIN A1C  01/13/2024   OPHTHALMOLOGY EXAM  02/21/2024   Diabetic kidney evaluation - eGFR measurement  05/26/2024   Medicare Annual Wellness (AWV)  08/16/2024   Pneumococcal Vaccine: 50+ Years  Completed   DEXA SCAN  Completed   Hepatitis B Vaccines  Aged Out   HPV VACCINES  Aged Out   Meningococcal B Vaccine  Aged Out    Health Maintenance  Health Maintenance Due  Topic Date Due   Diabetic kidney evaluation - Urine ACR  Never done   Zoster Vaccines- Shingrix (1 of 2) Never done   DTaP/Tdap/Td (3 - Tdap) 01/01/2020   COVID-19 Vaccine (4 - 2024-25 season) 09/19/2022   FOOT EXAM  03/23/2023   Health Maintenance Items Addressed:   Additional Screening:  Vision Screening: Recommended annual ophthalmology exams for early detection of glaucoma and other disorders of the eye. Would you like a referral to an eye doctor? No    Dental Screening: Recommended annual dental exams for proper oral hygiene  Community Resource Referral / Chronic Care Management: CRR required this visit?  No   CCM required this visit?  No   Plan:    I have personally reviewed and noted the following in the patient's chart:   Medical and social history Use of alcohol, tobacco or illicit drugs  Current medications and supplements including opioid prescriptions. Patient is not currently taking opioid prescriptions. Functional ability and status Nutritional status Physical activity Advanced directives List of other physicians Hospitalizations, surgeries, and ER visits in previous 12 months Vitals Screenings to include cognitive, depression, and falls Referrals and appointments  In addition, I have reviewed and discussed with patient certain preventive protocols, quality metrics, and best practice recommendations. A written personalized care plan for preventive services as well as general preventive health  recommendations were provided to patient.   Rojelio LELON Blush, LPN   2/69/7974   After Visit Summary: (MyChart) Due to this being a telephonic visit, the after visit summary with patients personalized plan was offered to patient via MyChart   Notes: Nothing significant to report at this time.

## 2023-08-29 ENCOUNTER — Telehealth: Payer: Self-pay

## 2023-08-29 NOTE — Telephone Encounter (Signed)
 Copied from CRM [id number]. Topic: Clinical - Medication Question >> Aug 29, 2023 11:54 AM Aleatha BROCKS wrote: Reason for CRM: Zelda From [facility] [phone] would like to know if patient is on insulin  please give call back

## 2023-09-01 NOTE — Telephone Encounter (Signed)
 Return Anna Gamble a call back. Update her pt is not on insulin . Anna Gamble states this is for dm supply. No further action is needed.

## 2023-09-07 ENCOUNTER — Other Ambulatory Visit

## 2023-09-07 DIAGNOSIS — E1165 Type 2 diabetes mellitus with hyperglycemia: Secondary | ICD-10-CM

## 2023-09-07 NOTE — Progress Notes (Signed)
 09/07/2023 Name: Anna Gamble MRN: [id number] DOB: 03-03-1935  Chief Complaint  Patient presents with   Medication Management   Diabetes   Anna Gamble is a 88 y.o. year old female who presented for a telephone visit.   They were referred to the pharmacist by their PCP for assistance in managing diabetes and hyperlipidemia.   Subjective:  Care Team: Primary Care Provider: Charlett Apolinar POUR, MD ; Next Scheduled Visit: 11/17/23  Medication Access/Adherence  Current Pharmacy:  [facility] - [place], [place] - [address] [place] [address] Phone: [phone] Fax: [phone]  [facility] [id number] [place], [place] - [address] [place] [address] Phone: [phone] Fax: [phone]  [facility] [address] [place] [address] Phone: [phone] Fax: [phone]   Patient reports affordability concerns with their medications: No  Patient reports access/transportation concerns to their pharmacy: No  Patient reports adherence concerns with their medications:  No    Diabetes: Current medications: Januvia  50mg  1 tab daily  Medications tried in the past: Metformin  (possible lactic acidosis - 08/2022 hospital admission), Ozempic , rybelsus , novolog , farxiga    Current glucose readings: Reports fasting in the 140s-160  Patient denies hypoglycemic s/sx including dizziness, shakiness, sweating.  Patient denies hyperglycemic symptoms including polyuria, polydipsia, polyphagia, nocturia, neuropathy, blurred vision.  Still complaining of tongue soreness, reports she has a long history of this  Current meal patterns: - Breakfast: eggs, 1 slice of sourdough bread, piece of meat (sausage)  - Lunch: piece of bread, potato, carrots, 1 slice of pimento cheese  - Supper: corn, beef, vegetables - carrots, onions, 1 boiled red  potato Patient reports limiting bread intake - in moderation   Objective:  Lab Results  Component Value Date   HGBA1C 7.2 (A) 07/14/2023    Lab Results  Component Value Date   CREATININE 0.80 05/27/2023   BUN 14 05/27/2023   NA 142 05/27/2023   K 4.4 05/27/2023   CL 102 05/27/2023   CO2 24 05/27/2023    Lab Results  Component Value Date   CHOL 171 05/27/2023   HDL 53 05/27/2023   LDLCALC 91 05/27/2023   LDLDIRECT 146.0 05/24/2022   TRIG 158 (H) 05/27/2023   CHOLHDL 3.2 05/27/2023    Medications Reviewed Today     Reviewed by Lionell Jon DEL, RPH (Pharmacist) on 09/07/23 at 1449  Med List Status: <None>   Medication Order Taking? Sig Documenting Provider Last Dose Status Informant  Accu-Chek FastClix Lancets MISC [id number]  1 each by Does not apply route daily. Use to test blood sugar once daily as instructed. Dx. E11.65 [name], Wanda K, MD  Active   Accu-Chek Softclix Lancets lancets [id number]  Use as instructed Webb, Padonda B, FNP  Active   acetaminophen  (TYLENOL ) 325 MG tablet [id number]  Take 1-2 tablets (325-650 mg total) by mouth every 4 (four) hours as needed for mild pain. Maurice Sharlet GORMAN DEVONNA  Active   aspirin  81 MG tablet [id number]  Take 81 mg by mouth daily. [provider]  Active Self, Pharmacy Records  famotidine  (PEPCID ) 10 MG tablet [id number]  Take 1 tablet (10 mg total) by mouth daily.  Patient taking differently: Take 10 mg by mouth as needed.   Maurice Sharlet GORMAN, PA-C  Active   glucose blood (ACCU-CHEK GUIDE TEST) test strip [id number]  Check 1-2x a day. [name], Apolinar POUR, MD  Active   hydrochlorothiazide  (MICROZIDE ) 12.5 MG capsule [id number]  TAKE 1 CAPSULE BY MOUTH DAILY Crenshaw, Redell RAMAN, MD  Active   Menthol -Methyl Salicylate  (MUSCLE RUB) 10-15 % CREA [id number]  Apply 1 Application topically 3 (three) times daily before meals. To bilateral knees  Patient taking differently: Apply 1 Application topically 3 (three) times daily before meals. To  bilateral knees as needed.   Maurice Sharlet RAMAN, PA-C  Active   Misc Natural Products (TART CHERRY ADVANCED PO) [id number]  Take 1 capsule by mouth daily.  [provider]  Active Self, Pharmacy Records           Med Note IRVIN, DION KANDICE [place] Jun 14, 2018  1:46 PM)    Multiple Vitamins-Minerals (MULTIVITAMIN WITH MINERALS) tablet [id number]  Take 1 tablet by mouth daily. [provider]  Active Self, Pharmacy Records  potassium chloride  (KLOR-CON  M) 10 MEQ tablet [id number]  Take 1 tablet (10 mEq total) by mouth daily.  Patient not taking: Reported on 07/14/2023   Love, Pamela S, PA-C  Active   RABEprazole  (ACIPHEX ) 20 MG tablet [id number]  Take 1 tablet (20 mg total) by mouth daily.  Patient not taking: Reported on 07/14/2023   Douglass Caul B, FNP  Active   rosuvastatin  (CRESTOR ) 5 MG tablet [id number]  Take 5 mg by mouth daily. [provider]  Active   sitaGLIPtin  (JANUVIA ) 50 MG tablet [id number]  Take 1 tablet (50 mg total) by mouth daily. [name], Apolinar POUR, MD  Active   sodium bicarbonate  650 MG tablet [id number]  Take 1 tablet (650 mg total) by mouth daily.  Patient not taking: Reported on 07/14/2023   Love, Pamela S, PA-C  Active               Assessment/Plan:   Diabetes: - Currently controlled, A1c goal <8% - Reviewed long term cardiovascular and renal outcomes of uncontrolled blood sugar - Reviewed goal A1c, goal fasting, and goal 2 hour post prandial glucose - Reviewed dietary modifications including limiting starchy foods, such as potatoes, cheese, and bread  -Counseled to check sugars daily once starting new therapy -Continue current medication therapy as patient declines any dose increase of januvia , notify office sooner than scheduled follow up of any concerns. Counseled that Januvia  is not a likely contributing of sore tongue and counseled on hydration. Offered appt with PCP to discuss tongue soreness if she would like, patient declines   Follow Up  Plan: 12/07/23    Jon VEAR [place], PharmD Clinical Pharmacist [phone]

## 2023-10-11 ENCOUNTER — Other Ambulatory Visit: Payer: Self-pay | Admitting: Cardiology

## 2023-11-09 DIAGNOSIS — Z1231 Encounter for screening mammogram for malignant neoplasm of breast: Secondary | ICD-10-CM | POA: Diagnosis not present

## 2023-11-09 LAB — HM MAMMOGRAPHY

## 2023-11-11 ENCOUNTER — Encounter: Payer: Self-pay | Admitting: Internal Medicine

## 2023-11-17 ENCOUNTER — Encounter: Payer: Self-pay | Admitting: Internal Medicine

## 2023-11-17 ENCOUNTER — Ambulatory Visit: Admitting: Internal Medicine

## 2023-11-17 VITALS — BP 126/74 | HR 65 | Temp 97.7°F | Ht 62.0 in | Wt 208.4 lb

## 2023-11-17 DIAGNOSIS — G629 Polyneuropathy, unspecified: Secondary | ICD-10-CM

## 2023-11-17 DIAGNOSIS — Z23 Encounter for immunization: Secondary | ICD-10-CM | POA: Diagnosis not present

## 2023-11-17 DIAGNOSIS — Z9181 History of falling: Secondary | ICD-10-CM

## 2023-11-17 DIAGNOSIS — R2689 Other abnormalities of gait and mobility: Secondary | ICD-10-CM | POA: Diagnosis not present

## 2023-11-17 DIAGNOSIS — K146 Glossodynia: Secondary | ICD-10-CM

## 2023-11-17 DIAGNOSIS — E1165 Type 2 diabetes mellitus with hyperglycemia: Secondary | ICD-10-CM

## 2023-11-17 DIAGNOSIS — Z79899 Other long term (current) drug therapy: Secondary | ICD-10-CM | POA: Diagnosis not present

## 2023-11-17 LAB — CBC WITH DIFFERENTIAL/PLATELET
Basophils Absolute: 0 K/uL (ref 0.0–0.1)
Basophils Relative: 0.6 % (ref 0.0–3.0)
Eosinophils Absolute: 0.2 K/uL (ref 0.0–0.7)
Eosinophils Relative: 2.3 % (ref 0.0–5.0)
HCT: 39.7 % (ref 36.0–46.0)
Hemoglobin: 13.5 g/dL (ref 12.0–15.0)
Lymphocytes Relative: 30 % (ref 12.0–46.0)
Lymphs Abs: 2 K/uL (ref 0.7–4.0)
MCHC: 34.1 g/dL (ref 30.0–36.0)
MCV: 93.9 fl (ref 78.0–100.0)
Monocytes Absolute: 0.6 K/uL (ref 0.1–1.0)
Monocytes Relative: 9.2 % (ref 3.0–12.0)
Neutro Abs: 3.9 K/uL (ref 1.4–7.7)
Neutrophils Relative %: 57.9 % (ref 43.0–77.0)
Platelets: 248 K/uL (ref 150.0–400.0)
RBC: 4.23 Mil/uL (ref 3.87–5.11)
RDW: 13.2 % (ref 11.5–15.5)
WBC: 6.7 K/uL (ref 4.0–10.5)

## 2023-11-17 LAB — BASIC METABOLIC PANEL WITH GFR
BUN: 17 mg/dL (ref 6–23)
CO2: 29 meq/L (ref 19–32)
Calcium: 9.2 mg/dL (ref 8.4–10.5)
Chloride: 101 meq/L (ref 96–112)
Creatinine, Ser: 0.84 mg/dL (ref 0.40–1.20)
GFR: 63.01 mL/min (ref >60.00)
Glucose, Bld: 135 mg/dL — ABNORMAL HIGH (ref 70–99)
Potassium: 4 meq/L (ref 3.5–5.1)
Sodium: 137 meq/L (ref 135–145)

## 2023-11-17 LAB — POCT GLYCOSYLATED HEMOGLOBIN (HGB A1C): Hemoglobin A1C: 7.2 % — AB (ref 4.0–5.6)

## 2023-11-17 LAB — HEPATIC FUNCTION PANEL
ALT: 19 U/L (ref 0–35)
AST: 18 U/L (ref 0–37)
Albumin: 4 g/dL (ref 3.5–5.2)
Alkaline Phosphatase: 60 U/L (ref 39–117)
Bilirubin, Direct: 0.1 mg/dL (ref 0.0–0.3)
Total Bilirubin: 0.6 mg/dL (ref 0.2–1.2)
Total Protein: 7.1 g/dL (ref 6.0–8.3)

## 2023-11-17 LAB — VITAMIN B12: Vitamin B-12: 179 pg/mL — ABNORMAL LOW (ref 211–911)

## 2023-11-17 LAB — TSH: TSH: 1.16 u[IU]/mL (ref 0.35–5.50)

## 2023-11-17 LAB — MICROALBUMIN / CREATININE URINE RATIO
Creatinine,U: 161.5 mg/dL
Microalb Creat Ratio: 5.5 mg/g (ref 0.0–30.0)
Microalb, Ur: 0.9 mg/dL (ref 0.0–1.9)

## 2023-11-17 NOTE — Progress Notes (Signed)
 Chief Complaint  Patient presents with   Medical Management of Chronic Issues    Pt reports Januvia  is costly. 90 days for $126. Think it cause her tongue to be sore. Pt c/o pain on hands-ache, burn  feels almost itchy    HPI: Anna Gamble 88 y.o. come in for Chronic disease management   DM   with some neuropathy  BG    usually 140 150   (200 one time.)  Checking every other day .   Wonders if there is a better med less cost?   Mobility :  uses wheel chair most of time  risk of falling.   Has one at home.  Needs replacement  paid for last one herself. Uses a lot to get around . Hx of neuropathy hx of fall and injury  .   Sore tongue  sensitivities  with some foods drinks. ? Could it be the januvia ?  No dental comment but has had lots of dental work . Takes a reg Multivit  no extra b12  tooth paste ?baking soda based given by dentist . ROS: See pertinent positives and negatives per HPI.  Past Medical History:  Diagnosis Date   CAD (coronary artery disease) 03/19/2014   Colon polyp    Coronary artery disease    GERD (gastroesophageal reflux disease)    Hx of colonoscopy with polypectomy 07/14/2012   medoff    Hyperlipidemia    Hypertension    Impaired fasting glucose    Morbid obesity (HCC)    S/P TAVR (transcatheter aortic valve replacement)    Severe aortic stenosis     Family History  Problem Relation Age of Onset   Coronary artery disease Other        female- 1st degree relative   Coronary artery disease Other        female- 1st degree relative   Hypertension Other    Hyperlipidemia Other    Heart attack Mother        age 48's   Heart attack Father        age 49's   Stroke Brother        died    Hearing loss Son    Sleep apnea Son    Aortic stenosis Sister    Stroke Brother    Heart attack Brother    Rheum arthritis Sister    Osteoarthritis Sister    Arthritis Sister    Heart Problems Brother     Social History   Socioeconomic History   Marital  status: Married    Spouse name: Not on file   Number of children: 4   Years of education: Not on file   Highest education level: Some college, no degree  Occupational History   Occupation: Retired    Comment: Takes care of Music therapist   Occupation: [employer], peds, cardiology    Comment: retired  Tobacco Use   Smoking status: Never   Smokeless tobacco: Never  Vaping Use   Vaping status: Never Used  Substance and Sexual Activity   Alcohol use: No   Drug use: No   Sexual activity: Not Currently    Comment: quit 40 yrs ago  Other Topics Concern   Not on file  Social History Narrative   hh of 3 -4  Married   Grand son   At home    Invalid son paraplegia and husband    And grandson recently     No pets  Fluctuating hh membors she cooks and prepares for them          Social Drivers of Health   Financial Resource Strain: Low Risk  (08/17/2023)   Overall Financial Resource Strain (CARDIA)    Difficulty of Paying Living Expenses: Not hard at all  Food Insecurity: No Food Insecurity (08/17/2023)   Hunger Vital Sign    Worried About Running Out of Food in the Last Year: Never true    Ran Out of Food in the Last Year: Never true  Transportation Needs: No Transportation Needs (08/17/2023)   PRAPARE - Administrator, Civil Service (Medical): No    Lack of Transportation (Non-Medical): No  Physical Activity: Inactive (08/17/2023)   Exercise Vital Sign    Days of Exercise per Week: 0 days    Minutes of Exercise per Session: 0 min  Stress: No Stress Concern Present (08/17/2023)   [employer] of Occupational Health - Occupational Stress Questionnaire    Feeling of Stress: Not at all  Social Connections: Socially Integrated (08/17/2023)   Social Connection and Isolation Panel    Frequency of Communication with Friends and Family: More than three times a week    Frequency of Social Gatherings with Friends and Family: More than three times a week    Attends Religious  Services: More than 4 times per year    Active Member of [employer] or Organizations: Yes    Attends Engineer, Structural: More than 4 times per year    Marital Status: Married    Outpatient Medications Prior to Visit  Medication Sig Dispense Refill   Accu-Chek FastClix Lancets MISC 1 each by Does not apply route daily. Use to test blood sugar once daily as instructed. Dx. E11.65 100 each 3   Accu-Chek Softclix Lancets lancets Use as instructed 100 each 12   acetaminophen  (TYLENOL ) 325 MG tablet Take 1-2 tablets (325-650 mg total) by mouth every 4 (four) hours as needed for mild pain.     aspirin  81 MG tablet Take 81 mg by mouth daily.     famotidine  (PEPCID ) 10 MG tablet Take 1 tablet (10 mg total) by mouth daily. (Patient taking differently: Take 10 mg by mouth as needed.) 30 tablet 0   hydrochlorothiazide  (MICROZIDE ) 12.5 MG capsule TAKE 1 CAPSULE BY MOUTH DAILY 90 capsule 2   Menthol -Methyl Salicylate  (MUSCLE RUB) 10-15 % CREA Apply 1 Application topically 3 (three) times daily before meals. To bilateral knees (Patient taking differently: Apply 1 Application topically 3 (three) times daily before meals. To bilateral knees as needed.) 85 g 0   Misc Natural Products (TART CHERRY ADVANCED PO) Take 1 capsule by mouth daily.      Multiple Vitamins-Minerals (MULTIVITAMIN WITH MINERALS) tablet Take 1 tablet by mouth daily.     sitaGLIPtin  (JANUVIA ) 50 MG tablet Take 1 tablet (50 mg total) by mouth daily. 90 tablet 1   glucose blood (ACCU-CHEK GUIDE TEST) test strip Check 1-2x a day. 100 each 12   potassium chloride  (KLOR-CON  M) 10 MEQ tablet Take 1 tablet (10 mEq total) by mouth daily. (Patient not taking: Reported on 11/17/2023) 30 tablet 0   RABEprazole  (ACIPHEX ) 20 MG tablet Take 1 tablet (20 mg total) by mouth daily. (Patient not taking: Reported on 11/17/2023) 30 tablet 2   rosuvastatin  (CRESTOR ) 5 MG tablet Take 5 mg by mouth daily. (Patient not taking: Reported on 11/17/2023)     sodium  bicarbonate 650 MG tablet Take 1 tablet (650 mg  total) by mouth daily. (Patient not taking: Reported on 11/17/2023) 30 tablet 0   No facility-administered medications prior to visit.     EXAM:  BP 126/74 (BP Location: Left Arm, Patient Position: Sitting, Cuff Size: Large)   Pulse 65   Temp 97.7 F (36.5 C)   Ht 5' 2 (1.575 m)   Wt 208 lb 6.4 oz (94.5 kg)   SpO2 95%   BMI 38.12 kg/m   Body mass index is 38.12 kg/m.  GENERAL: vitals reviewed and listed above, alert, oriented, appears well hydrated and in no acute distress sitting in wc  maneurvers with feet  and ok  HEENT: atraumatic, conjunctiva  clear, no obvious abnormalities on inspection of external nose and ears OP : no lesion edema or exudate  tongue has papilla and no lesions  no plaque white obvious  NECK: no obvious masses on inspection palpation  LUNGS: clear to auscultation bilaterally, no wheezes, rales or rhonchi, good air movement CV: HRRR, no clubbing cyanosis nl cap refill  MS: moves all extremities  PSYCH: pleasant and cooperative, no obvious depression or anxiety nl cognition  Lab Results  Component Value Date   WBC 5.9 09/23/2022   HGB 13.9 09/23/2022   HCT 42.0 09/23/2022   PLT 275.0 09/23/2022   GLUCOSE 143 (H) 05/27/2023   CHOL 171 05/27/2023   TRIG 158 (H) 05/27/2023   HDL 53 05/27/2023   LDLDIRECT 146.0 05/24/2022   LDLCALC 91 05/27/2023   ALT 18 05/27/2023   AST 18 05/27/2023   NA 142 05/27/2023   K 4.4 05/27/2023   CL 102 05/27/2023   CREATININE 0.80 05/27/2023   BUN 14 05/27/2023   CO2 24 05/27/2023   TSH 0.95 04/13/2021   INR 1.1 08/27/2022   HGBA1C 7.2 (A) 11/17/2023   BP Readings from Last 3 Encounters:  11/17/23 126/74  07/14/23 100/70  05/24/23 122/66   Lab Results  Component Value Date   VITAMINB12 346 09/01/2022     ASSESSMENT AND PLAN:  Discussed the following assessment and plan:  Type 2 diabetes mellitus with hyperglycemia, without long-term current use of insulin   (HCC) - Plan: POC HgB A1c, Vitamin B12, Basic metabolic panel with GFR, CBC with Differential/Platelet, Microalbumin / creatinine urine ratio, Hepatic function panel, TSH  Neuropathy - Plan: Vitamin B12, Basic metabolic panel with GFR, CBC with Differential/Platelet, Microalbumin / creatinine urine ratio, Hepatic function panel, TSH, For home use only DME Other see comment  Medication management - Plan: Vitamin B12, Basic metabolic panel with GFR, CBC with Differential/Platelet, Microalbumin / creatinine urine ratio, Hepatic function panel, TSH  Influenza vaccine needed - Plan: Flu vaccine HIGH DOSE PF(Fluzone Trivalent)  Imbalance - Plan: Vitamin B12, Basic metabolic panel with GFR, CBC with Differential/Platelet, Microalbumin / creatinine urine ratio, Hepatic function panel, TSH, For home use only DME Other see comment  History of falling - Plan: Vitamin B12, Basic metabolic panel with GFR, CBC with Differential/Platelet, Microalbumin / creatinine urine ratio, Hepatic function panel, TSH, For home use only DME Other see comment  Soreness of tongue - Plan: Vitamin B12, Basic metabolic panel with GFR, CBC with Differential/Platelet, Microalbumin / creatinine urine ratio, Hepatic function panel, TSH She is disappointed that A1c not better and concern januvia  could cause sore tongue  but doubtful.   I dont see thrush . Check vit b12  and update labs Will ask Jon again to see if cost can be better or other med options to help .  Since every med options  has a positive and negative component .  Ordered wc  written rx .  -Patient advised to return or notify health care team  if  new concerns arise. 45 minutes review  discussion plan and fu   Patient Instructions  Checking b12 level and koidney function update .  May want to add  b12 vit or a multivit with b12 .   Will get angela to contact you again about cost of medication other options  etc. May be check 2 hours after eating once a week.    Wheel Chair   prescription.   Plan fu in 3-4 months  or as needed.    Aquiles Ruffini K. Kameela Leipold M.D.

## 2023-11-17 NOTE — Patient Instructions (Addendum)
 Checking b12 level and koidney function update .  May want to add  b12 vit or a multivit with b12 .   Will get angela to contact you again about cost of medication other options  etc. May be check 2 hours after eating once a week.   Wheel Chair   prescription.   Plan fu in 3-4 months  or as needed.

## 2023-11-18 ENCOUNTER — Ambulatory Visit: Payer: Self-pay | Admitting: Internal Medicine

## 2023-11-18 NOTE — Progress Notes (Signed)
 B12  is low   kidney function  blood count   liver and thyroid  are all normal  Please begin  dissolvable b12 at least 1000 mcg per day  . Uncertain if related to sore tongue  but possible .    May take a month to make a difference if that is cause of problem   forwarding to Ola as FYI also

## 2023-11-22 DIAGNOSIS — Z85828 Personal history of other malignant neoplasm of skin: Secondary | ICD-10-CM | POA: Diagnosis not present

## 2023-11-22 DIAGNOSIS — L821 Other seborrheic keratosis: Secondary | ICD-10-CM | POA: Diagnosis not present

## 2023-11-22 DIAGNOSIS — L814 Other melanin hyperpigmentation: Secondary | ICD-10-CM | POA: Diagnosis not present

## 2023-11-22 DIAGNOSIS — D485 Neoplasm of uncertain behavior of skin: Secondary | ICD-10-CM | POA: Diagnosis not present

## 2023-11-22 DIAGNOSIS — L570 Actinic keratosis: Secondary | ICD-10-CM | POA: Diagnosis not present

## 2023-11-22 DIAGNOSIS — L820 Inflamed seborrheic keratosis: Secondary | ICD-10-CM | POA: Diagnosis not present

## 2023-12-07 ENCOUNTER — Other Ambulatory Visit

## 2023-12-07 DIAGNOSIS — E1165 Type 2 diabetes mellitus with hyperglycemia: Secondary | ICD-10-CM

## 2023-12-07 NOTE — Progress Notes (Signed)
 12/07/2023 Name: Lorey Pallett MRN: [id number] DOB: 1935-07-26  Chief Complaint  Patient presents with   Medication Management   Diabetes   Tien Aispuro is a 88 y.o. year old female who presented for a telephone visit.   They were referred to the pharmacist by their PCP for assistance in managing diabetes and hyperlipidemia.   Subjective:  Care Team: Primary Care Provider: [name], Wanda K, MD   Medication Access/Adherence  Current Pharmacy:  [facility] - [place], [place] - [address] [place] [address] Phone: [phone] Fax: [phone]  [facility] [id number] [place], [place] - [address] [place] [address] Phone: [phone] Fax: [phone]  [facility] [address] [place] [address] Phone: [phone] Fax: [phone]   Patient reports affordability concerns with their medications: No  Patient reports access/transportation concerns to their pharmacy: No  Patient reports adherence concerns with their medications:  No    Diabetes: Current medications: Januvia  50mg  1 tab daily  Medications tried in the past: Metformin  (possible lactic acidosis - 08/2022 hospital admission), Ozempic , rybelsus , novolog , farxiga    Current glucose readings: Reports fasting in the 140s-160  Patient denies hypoglycemic s/sx including dizziness, shakiness, sweating.  Patient denies hyperglycemic symptoms including polyuria, polydipsia, polyphagia, nocturia, neuropathy, blurred vision.  Feels Januvia  has worsened her joint pain and requests to stop medication.  Current meal patterns: - Breakfast: eggs, 1 slice of sourdough bread, piece of meat (sausage)  - Lunch: piece of bread, potato, carrots, 1 slice of pimento cheese  - Supper: corn, beef, vegetables - carrots, onions, 1 boiled red potato Patient reports limiting bread intake -  in moderation   Objective:  Lab Results  Component Value Date   HGBA1C 7.2 (A) 11/17/2023    Lab Results  Component Value Date   CREATININE 0.84 11/17/2023   BUN 17 11/17/2023   NA 137 11/17/2023   K 4.0 11/17/2023   CL 101 11/17/2023   CO2 29 11/17/2023    Lab Results  Component Value Date   CHOL 171 05/27/2023   HDL 53 05/27/2023   LDLCALC 91 05/27/2023   LDLDIRECT 146.0 05/24/2022   TRIG 158 (H) 05/27/2023   CHOLHDL 3.2 05/27/2023    Medications Reviewed Today     Reviewed by Lionell Jon DEL, RPH (Pharmacist) on 12/07/23 at 1319  Med List Status: <None>   Medication Order Taking? Sig Documenting Provider Last Dose Status Informant  Accu-Chek FastClix Lancets MISC [id number]  1 each by Does not apply route daily. Use to test blood sugar once daily as instructed. Dx. E11.65 [name], Wanda K, MD  Active   Accu-Chek Softclix Lancets lancets [id number]  Use as instructed Webb, Padonda B, FNP  Active   acetaminophen  (TYLENOL ) 325 MG tablet [id number]  Take 1-2 tablets (325-650 mg total) by mouth every 4 (four) hours as needed for mild pain. Maurice Sharlet GORMAN DEVONNA  Active   aspirin  81 MG tablet [id number]  Take 81 mg by mouth daily. [provider]  Active Self, Pharmacy Records  famotidine  (PEPCID ) 10 MG tablet [id number]  Take 1 tablet (10 mg total) by mouth daily.  Patient taking differently: Take 10 mg by mouth as needed.   Maurice Sharlet GORMAN, PA-C  Active   glucose blood (ACCU-CHEK GUIDE TEST) test strip [id number]  Check 1-2x a day. [name], Wanda K, MD  Active   hydrochlorothiazide  (MICROZIDE )  12.5 MG capsule [id number]  TAKE 1 CAPSULE BY MOUTH DAILY Crenshaw, Redell RAMAN, MD  Active   Menthol -Methyl Salicylate  (MUSCLE RUB) 10-15 % CREA [id number]  Apply 1 Application topically 3 (three) times daily before meals. To bilateral knees  Patient taking differently: Apply 1 Application topically 3 (three) times daily before meals. To bilateral knees as needed.   Maurice Sharlet RAMAN, PA-C   Active   Misc Natural Products (TART CHERRY ADVANCED PO) [id number]  Take 1 capsule by mouth daily.  [provider]  Active Self, Pharmacy Records           Med Note IRVIN, DION KANDICE [place] Jun 14, 2018  1:46 PM)    Multiple Vitamins-Minerals (MULTIVITAMIN WITH MINERALS) tablet [id number]  Take 1 tablet by mouth daily. [provider]  Active Self, Pharmacy Records  potassium chloride  (KLOR-CON  M) 10 MEQ tablet [id number]  Take 1 tablet (10 mEq total) by mouth daily.  Patient not taking: Reported on 11/17/2023   Love, Pamela S, PA-C  Active   RABEprazole  (ACIPHEX ) 20 MG tablet [id number]  Take 1 tablet (20 mg total) by mouth daily.  Patient not taking: Reported on 11/17/2023   Webb, Padonda B, FNP  Active   rosuvastatin  (CRESTOR ) 5 MG tablet [id number]  Take 5 mg by mouth daily.  Patient not taking: Reported on 11/17/2023   [provider]  Active   sitaGLIPtin  (JANUVIA ) 50 MG tablet [id number]  Take 1 tablet (50 mg total) by mouth daily. [name], Apolinar POUR, MD  Active   sodium bicarbonate  650 MG tablet [id number]  Take 1 tablet (650 mg total) by mouth daily.  Patient not taking: Reported on 11/17/2023   Love, Pamela S, PA-C  Active               Assessment/Plan:   Diabetes: - Currently controlled, A1c goal <8% - Reviewed long term cardiovascular and renal outcomes of uncontrolled blood sugar - Reviewed goal A1c, goal fasting, and goal 2 hour post prandial glucose - Reviewed dietary modifications including limiting starchy foods, such as potatoes, cheese, and bread  -Counseled to check sugars daily and keep a log. -STOP Januvia . Monitor BG daily, follow up in 3 weeks to assess BG control. If elevated, initiate Glipizide 2.5mg  daily   Follow Up Plan: 12/28/23    Jon VEAR [place], PharmD Clinical Pharmacist [phone]

## 2023-12-07 NOTE — Progress Notes (Signed)
 HPI: FU aortic stenosis/AVR. Placed on beta blocker previously secondary to PVCs. Found to have severe aortic stenosis on follow-up echocardiogram March 2020. Cardiac catheterization revealed 40% proximal right coronary artery and 20% ostial left main. Carotid Dopplers May 2020 showed 1 to 39% bilateral stenosis. Had TAVR May 2020. Monitor June 2020 showed sinus rhythm with occasional PAC, PVC, couplet and brief PAT. Last echocardiogram 9/24 showed normal LV function, mild left ventricular hypertrophy, grade 1 diastolic dysfunction, mild left atrial enlargement, status post aortic valve replacement with mean gradient 13 mmHg and no aortic insufficiency.  Since last seen, Anna Gamble has dyspnea on exertion unchanged.  No orthopnea, PND, pedal edema, exertional chest pain or syncope.  Current Outpatient Medications  Medication Sig Dispense Refill   Accu-Chek FastClix Lancets MISC 1 each by Does not apply route daily. Use to test blood sugar once daily as instructed. Dx. E11.65 100 each 3   Accu-Chek Softclix Lancets lancets Use as instructed 100 each 12   acetaminophen  (TYLENOL ) 325 MG tablet Take 1-2 tablets (325-650 mg total) by mouth every 4 (four) hours as needed for mild pain.     aspirin  81 MG tablet Take 81 mg by mouth daily.     famotidine  (PEPCID ) 10 MG tablet Take 1 tablet (10 mg total) by mouth daily. (Patient taking differently: Take 10 mg by mouth as needed.) 30 tablet 0   glucose blood (ACCU-CHEK GUIDE TEST) test strip Check 1-2x a day. 100 each 12   hydrochlorothiazide  (MICROZIDE ) 12.5 MG capsule TAKE 1 CAPSULE BY MOUTH DAILY 90 capsule 2   Menthol -Methyl Salicylate  (MUSCLE RUB) 10-15 % CREA Apply 1 Application topically 3 (three) times daily before meals. To bilateral knees (Patient taking differently: Apply 1 Application topically 3 (three) times daily before meals. To bilateral knees as needed.) 85 g 0   Misc Natural Products (TART CHERRY ADVANCED PO) Take 1 capsule by mouth daily.       Multiple Vitamins-Minerals (MULTIVITAMIN WITH MINERALS) tablet Take 1 tablet by mouth daily.     potassium chloride  (KLOR-CON  M) 10 MEQ tablet Take 1 tablet (10 mEq total) by mouth daily. (Patient not taking: Reported on 11/17/2023) 30 tablet 0   RABEprazole  (ACIPHEX ) 20 MG tablet Take 1 tablet (20 mg total) by mouth daily. (Patient not taking: Reported on 11/17/2023) 30 tablet 2   rosuvastatin  (CRESTOR ) 5 MG tablet Take 5 mg by mouth daily. (Patient not taking: Reported on 11/17/2023)     sodium bicarbonate  650 MG tablet Take 1 tablet (650 mg total) by mouth daily. (Patient not taking: Reported on 11/17/2023) 30 tablet 0   No current facility-administered medications for this visit.     Past Medical History:  Diagnosis Date   CAD (coronary artery disease) 03/19/2014   Colon polyp    Coronary artery disease    GERD (gastroesophageal reflux disease)    Hx of colonoscopy with polypectomy 07/14/2012   medoff    Hyperlipidemia    Hypertension    Impaired fasting glucose    Morbid obesity (HCC)    S/P TAVR (transcatheter aortic valve replacement)    Severe aortic stenosis     Past Surgical History:  Procedure Laterality Date   APPENDECTOMY     CHOLECYSTECTOMY     FOOT SURGERY     rt   KNEE SURGERY     rt   LEFT AND RIGHT HEART CATHETERIZATION WITH CORONARY ANGIOGRAM N/A 10/10/2013   Procedure: LEFT AND RIGHT HEART CATHETERIZATION WITH CORONARY ANGIOGRAM;  Surgeon: Lonni JONETTA Cash, MD;  Location: [facility] CATH LAB;  Service: Cardiovascular;  Laterality: N/A;   RIGHT/LEFT HEART CATH AND CORONARY ANGIOGRAPHY N/A 04/06/2018   Procedure: RIGHT/LEFT HEART CATH AND CORONARY ANGIOGRAPHY;  Surgeon: Cash Lonni JONETTA, MD;  Location: [facility] INVASIVE CV LAB;  Service: Cardiovascular;  Laterality: N/A;   TEE WITHOUT CARDIOVERSION N/A 06/06/2018   Procedure: TRANSESOPHAGEAL ECHOCARDIOGRAM (TEE);  Surgeon: Cash Lonni JONETTA, MD;  Location: [facility] OR;  Service: Open Heart Surgery;  Laterality: N/A;    TONSILLECTOMY     TOTAL KNEE ARTHROPLASTY  02/01/2011   Procedure: TOTAL KNEE ARTHROPLASTY;  Surgeon: Dempsey JINNY Sensor;  Location: [facility] OR;  Service: Orthopedics;  Laterality: Right;  DEPUY/SIGMA   TRANSCATHETER AORTIC VALVE REPLACEMENT, TRANSFEMORAL N/A 06/06/2018   Procedure: TRANSCATHETER AORTIC VALVE REPLACEMENT, TRANSFEMORAL;  Surgeon: Cash Lonni JONETTA, MD;  Location: [facility] OR;  Service: Open Heart Surgery;  Laterality: N/A;    Social History   Socioeconomic History   Marital status: Married    Spouse name: Not on file   Number of children: 4   Years of education: Not on file   Highest education level: Some college, no degree  Occupational History   Occupation: Retired    Comment: Takes care of Music therapist   Occupation: [employer], peds, cardiology    Comment: retired  Tobacco Use   Smoking status: Never   Smokeless tobacco: Never  Vaping Use   Vaping status: Never Used  Substance and Sexual Activity   Alcohol use: No   Drug use: No   Sexual activity: Not Currently    Comment: quit 40 yrs ago  Other Topics Concern   Not on file  Social History Narrative   hh of 3 -4  Married   Grand son   At home    Invalid son paraplegia and husband    And grandson recently     No pets   Fluctuating hh membors Anna Gamble cooks and prepares for them          Social Drivers of Health   Financial Resource Strain: Low Risk  (08/17/2023)   Overall Financial Resource Strain (CARDIA)    Difficulty of Paying Living Expenses: Not hard at all  Food Insecurity: No Food Insecurity (08/17/2023)   Hunger Vital Sign    Worried About Running Out of Food in the Last Year: Never true    Ran Out of Food in the Last Year: Never true  Transportation Needs: No Transportation Needs (08/17/2023)   PRAPARE - Administrator, Civil Service (Medical): No    Lack of Transportation (Non-Medical): No  Physical Activity: Inactive (08/17/2023)   Exercise Vital Sign    Days of Exercise per Week: 0 days     Minutes of Exercise per Session: 0 min  Stress: No Stress Concern Present (08/17/2023)   [employer] of Occupational Health - Occupational Stress Questionnaire    Feeling of Stress: Not at all  Social Connections: Socially Integrated (08/17/2023)   Social Connection and Isolation Panel    Frequency of Communication with Friends and Family: More than three times a week    Frequency of Social Gatherings with Friends and Family: More than three times a week    Attends Religious Services: More than 4 times per year    Active Member of [employer] or Organizations: Yes    Attends Engineer, Structural: More than 4 times per year    Marital Status: Married  Catering Manager Violence: Not At Risk (  08/17/2023)   Humiliation, Afraid, Rape, and Kick questionnaire    Fear of Current or Ex-Partner: No    Emotionally Abused: No    Physically Abused: No    Sexually Abused: No    Family History  Problem Relation Age of Onset   Coronary artery disease Other        female- 1st degree relative   Coronary artery disease Other        female- 1st degree relative   Hypertension Other    Hyperlipidemia Other    Heart attack Mother        age 23's   Heart attack Father        age 10's   Stroke Brother        died    Hearing loss Son    Sleep apnea Son    Aortic stenosis Sister    Stroke Brother    Heart attack Brother    Rheum arthritis Sister    Osteoarthritis Sister    Arthritis Sister    Heart Problems Brother     ROS: no fevers or chills, productive cough, hemoptysis, dysphasia, odynophagia, melena, hematochezia, dysuria, hematuria, rash, seizure activity, orthopnea, PND, pedal edema, claudication. Remaining systems are negative.  Physical Exam: Well-developed well-nourished in no acute distress.  Skin is warm and dry.  HEENT is normal.  Neck is supple.  Chest is clear to auscultation with normal expansion.  Cardiovascular exam is regular rate and rhythm.  Abdominal exam  nontender or distended. No masses palpated. Extremities show no edema. neuro grossly intact  EKG Interpretation Date/Time:  Tuesday December 20 2023 08:52:18 EST Ventricular Rate:  63 PR Interval:  152 QRS Duration:  92 QT Interval:  410 QTC Calculation: 419 R Axis:   7  Text Interpretation: Sinus rhythm with occasional Premature ventricular complexes Minimal voltage criteria for LVH, may be normal variant ( R in aVL ) Nonspecific ST and T wave abnormality Confirmed by Pietro Rogue ([id number]) on 12/20/2023 8:54:38 AM    A/P  1 history of TAVR-most recent echocardiogram showed normally functioning valve.  Continue SBE prophylaxis.  2 coronary artery disease-mild on previous catheterization.  Anna Gamble is not having chest pain.  Continue aspirin  and resume statin.  3 Hypertension-blood pressure controlled.  Continue present medications.  4 hyperlipidemia-resume Crestor  10 mg daily.  Check lipids and liver in 8 weeks.  5 history of palpitations-Anna Gamble is taking beta-blocker as needed with no recent symptoms.  6 chronic dyspnea-felt secondary to hypoventilation syndrome/deconditioning.  No change since previous visit.  Rogue Pietro, MD

## 2023-12-14 ENCOUNTER — Other Ambulatory Visit: Payer: Self-pay | Admitting: Internal Medicine

## 2023-12-20 ENCOUNTER — Encounter: Payer: Self-pay | Admitting: Cardiology

## 2023-12-20 ENCOUNTER — Ambulatory Visit: Attending: Cardiology | Admitting: Cardiology

## 2023-12-20 VITALS — BP 125/74 | HR 62 | Resp 14 | Ht 62.0 in | Wt 209.0 lb

## 2023-12-20 DIAGNOSIS — I2510 Atherosclerotic heart disease of native coronary artery without angina pectoris: Secondary | ICD-10-CM | POA: Diagnosis not present

## 2023-12-20 DIAGNOSIS — E7800 Pure hypercholesterolemia, unspecified: Secondary | ICD-10-CM | POA: Insufficient documentation

## 2023-12-20 DIAGNOSIS — Z952 Presence of prosthetic heart valve: Secondary | ICD-10-CM | POA: Diagnosis not present

## 2023-12-20 DIAGNOSIS — I10 Essential (primary) hypertension: Secondary | ICD-10-CM | POA: Diagnosis not present

## 2023-12-20 MED ORDER — ROSUVASTATIN CALCIUM 10 MG PO TABS: 10.0000 mg | ORAL_TABLET | Freq: Every day | ORAL | 3 refills | Status: AC

## 2023-12-20 NOTE — Patient Instructions (Signed)
 Medication Instructions:  START rosuvastatin  10mg  daily for cholesterol   *If you need a refill on your cardiac medications before your next appointment, please call your pharmacy*  Lab Work: FASTING lab work in 8 weeks -- lipid panel, hepatic function panel   If you have labs (blood work) drawn today and your tests are completely normal, you will receive your results only by: MyChart Message (if you have MyChart) OR A paper copy in the mail If you have any lab test that is abnormal or we need to change your treatment, we will call you to review the results.  Testing/Procedures: NONE  Follow-Up: At [facility], you and your health needs are our priority.  As part of our continuing mission to provide you with exceptional heart care, our providers are all part of one team.  This team includes your primary Cardiologist (physician) and Advanced Practice Providers or APPs (Physician Assistants and Nurse Practitioners) who all work together to provide you with the care you need, when you need it.  Your next appointment:   6 months with Dr. Pietro  We recommend signing up for the patient portal called MyChart.  Sign up information is provided on this After Visit Summary.  MyChart is used to connect with patients for Virtual Visits (Telemedicine).  Patients are able to view lab/test results, encounter notes, upcoming appointments, etc.  Non-urgent messages can be sent to your provider as well.   To learn more about what you can do with MyChart, go to [email or web address].   Other Instructions

## 2023-12-28 ENCOUNTER — Other Ambulatory Visit

## 2023-12-28 DIAGNOSIS — E1165 Type 2 diabetes mellitus with hyperglycemia: Secondary | ICD-10-CM

## 2023-12-28 MED ORDER — GLIPIZIDE 2.5 MG PO TABS: 2.5000 mg | ORAL_TABLET | Freq: Every day | ORAL | 0 refills | Status: DC

## 2023-12-28 NOTE — Progress Notes (Signed)
 12/28/2023 Name: Anna Gamble MRN: [id number] DOB: 1935/05/31  Chief Complaint  Patient presents with   Medication Management   Diabetes   Anna Gamble is a 88 y.o. year old female who presented for a telephone visit.   They were referred to the pharmacist by their PCP for assistance in managing diabetes and hyperlipidemia.   Subjective:  Care Team: Primary Care Provider: [name], Wanda K, MD   Medication Access/Adherence  Current Pharmacy:  [facility] - [place] - [address] [place] [address] Phone: [phone] Fax: [phone]  [facility] [id number] [place], [place] - [address] [place] [address] Phone: [phone] Fax: [phone]  [facility] [address] [place] [address] Phone: [phone] Fax: [phone]   Patient reports affordability concerns with their medications: No  Patient reports access/transportation concerns to their pharmacy: No  Patient reports adherence concerns with their medications:  No    Diabetes: Current medications: None Medications tried in the past: Metformin  (possible lactic acidosis - 08/2022 hospital admission), Ozempic , rybelsus , novolog , farxiga    Current glucose readings: Reports fasting in the 140s-160s  Patient denies hypoglycemic s/sx including dizziness, shakiness, sweating.  Patient denies hyperglycemic symptoms including polyuria, polydipsia, polyphagia, nocturia, neuropathy, blurred vision.  Feels joint pain has improved some since stopping jardiance  Current meal patterns: - Breakfast: eggs, 1 slice of sourdough bread, piece of meat (sausage)  - Lunch: piece of bread, potato, carrots, 1 slice of pimento cheese  - Supper: corn, beef, vegetables - carrots, onions, 1 boiled red potato Patient reports limiting bread intake - in  moderation   Objective:  Lab Results  Component Value Date   HGBA1C 7.2 (A) 11/17/2023    Lab Results  Component Value Date   CREATININE 0.84 11/17/2023   BUN 17 11/17/2023   NA 137 11/17/2023   K 4.0 11/17/2023   CL 101 11/17/2023   CO2 29 11/17/2023    Lab Results  Component Value Date   CHOL 171 05/27/2023   HDL 53 05/27/2023   LDLCALC 91 05/27/2023   LDLDIRECT 146.0 05/24/2022   TRIG 158 (H) 05/27/2023   CHOLHDL 3.2 05/27/2023    Medications Reviewed Today     Reviewed by Lionell Jon DEL, RPH (Pharmacist) on 12/28/23 at 1301  Med List Status: <None>   Medication Order Taking? Sig Documenting Provider Last Dose Status Informant  Accu-Chek FastClix Lancets MISC [id number]  1 each by Does not apply route daily. Use to test blood sugar once daily as instructed. Dx. E11.65 [name], Wanda K, MD  Active   Accu-Chek Softclix Lancets lancets [id number]  Use as instructed Webb, Padonda B, FNP  Active   acetaminophen  (TYLENOL ) 325 MG tablet [id number]  Take 1-2 tablets (325-650 mg total) by mouth every 4 (four) hours as needed for mild pain. Love, Pamela S, PA-C  Active   aspirin  81 MG tablet [id number]  Take 81 mg by mouth daily. [provider]  Active Self, Pharmacy Records  famotidine  (PEPCID ) 10 MG tablet [id number]  Take 1 tablet (10 mg total) by mouth daily.  Patient taking differently: Take 10 mg by mouth as needed.   Maurice Sharlet RAMAN, PA-C  Active   glucose blood (ACCU-CHEK GUIDE TEST) test strip [id number]  Check 1-2x a day. [name], Wanda K, MD  Active   hydrochlorothiazide  (MICROZIDE ) 12.5 MG capsule [id number]  TAKE 1 CAPSULE  BY MOUTH DAILY Pietro Redell RAMAN, MD  Active   Menthol -Methyl Salicylate  (MUSCLE RUB) 10-15 % CREA [id number]  Apply 1 Application topically 3 (three) times daily before meals. To bilateral knees  Patient taking differently: Apply 1 Application topically 3 (three) times daily before meals. To bilateral knees as needed.   Maurice Sharlet RAMAN, PA-C   Active   Misc Natural Products (TART CHERRY ADVANCED PO) [id number]  Take 1 capsule by mouth daily.  [provider]  Active Self, Pharmacy Records           Med Note IRVIN, DION KANDICE [place] Jun 14, 2018  1:46 PM)    Multiple Vitamins-Minerals (MULTIVITAMIN WITH MINERALS) tablet [id number]  Take 1 tablet by mouth daily. [provider]  Active Self, Pharmacy Records  potassium chloride  (KLOR-CON  M) 10 MEQ tablet [id number]  Take 1 tablet (10 mEq total) by mouth daily.  Patient not taking: Reported on 11/17/2023   Love, Pamela S, PA-C  Active   RABEprazole  (ACIPHEX ) 20 MG tablet [id number]  Take 1 tablet (20 mg total) by mouth daily.  Patient not taking: Reported on 11/17/2023   Webb, Padonda B, FNP  Active   rosuvastatin  (CRESTOR ) 10 MG tablet [id number]  Take 1 tablet (10 mg total) by mouth daily. Pietro Redell RAMAN, MD  Active   sodium bicarbonate  650 MG tablet [id number]  Take 1 tablet (650 mg total) by mouth daily.  Patient not taking: Reported on 11/17/2023   Love, Pamela S, PA-C  Active               Assessment/Plan:   Diabetes: - Currently controlled, A1c goal <7.5% - Reviewed long term cardiovascular and renal outcomes of uncontrolled blood sugar - Reviewed goal A1c, goal fasting, and goal 2 hour post prandial glucose - Reviewed dietary modifications including limiting starchy foods, such as potatoes, cheese, and bread  -Counseled to check sugars daily and keep a log. -Discussed possibility of attempting just diet control vs starting glipizide, patient prefers to start glipizide -START Glipizide 2.5mg  daily   Follow Up Plan: 12/28/23    Jon VEAR [place], PharmD Clinical Pharmacist [phone]

## 2023-12-29 MED ORDER — GLIPIZIDE 5 MG PO TABS: 2.5000 mg | ORAL_TABLET | Freq: Every day | ORAL | 3 refills | Status: DC

## 2023-12-29 MED ORDER — ACCU-CHEK GUIDE TEST VI STRP: ORAL_STRIP | 12 refills | Status: AC

## 2024-01-23 ENCOUNTER — Telehealth: Payer: Self-pay

## 2024-01-23 NOTE — Telephone Encounter (Signed)
 Please help with PA on Glipizide . Thank you!

## 2024-01-24 ENCOUNTER — Other Ambulatory Visit (HOSPITAL_COMMUNITY): Payer: Self-pay

## 2024-01-24 ENCOUNTER — Telehealth: Payer: Self-pay

## 2024-01-24 MED ORDER — GLIPIZIDE 5 MG PO TABS: 2.5000 mg | ORAL_TABLET | Freq: Every day | ORAL | 3 refills | Status: AC

## 2024-01-24 NOTE — Addendum Note (Signed)
 Addended by: Elza Sortor on: 01/24/2024 05:17 PM   Modules accepted: Orders

## 2024-01-24 NOTE — Telephone Encounter (Signed)
 Pharmacy Patient Advocate Encounter   Received notification from Physician's Office that prior authorization for Glipizide  5 mg tablets is required/requested.   Insurance verification completed.   The patient is insured through [employer].   Per test claim: The current 90 day co-pay is, $11.39.  No PA needed at this time. This test claim was processed through [facility]- copay amounts may vary at other pharmacies due to pharmacy/plan contracts, or as the patient moves through the different stages of their insurance plan.

## 2024-01-24 NOTE — Telephone Encounter (Signed)
 Contacted pt's [facility] pharmacy, [facility]. Inform them of information below. He reports they did not received the 5mg  on 12/29/2023 but 2.5mg  on the 12. He states the insurance doesn't cover the 2.5mg . inform we will resend in the Rx.   The prescription is resent to [facility].   Follow up with pt. Inform pt of information above and if don't hear from them, advise to follow up with us . Pt verbalized understanding.

## 2024-01-27 ENCOUNTER — Telehealth: Payer: Self-pay

## 2024-01-27 NOTE — Telephone Encounter (Signed)
 Copied from CRM #[id number]. Topic: Appointments - Appointment Info/Confirmation >> Jan 27, 2024 11:52 AM Anna Gamble wrote: Patient called said that she is not able to keep appointment with Pharmacist on 01/30/24-please call to reschedule [phone]

## 2024-01-30 ENCOUNTER — Other Ambulatory Visit

## 2024-02-27 ENCOUNTER — Other Ambulatory Visit

## 2024-04-03 ENCOUNTER — Ambulatory Visit: Admitting: Internal Medicine

## 2024-08-22 ENCOUNTER — Ambulatory Visit
